# Patient Record
Sex: Female | Born: 1970 | Race: Black or African American | Hispanic: No | Smoking: Never smoker
Health system: Southern US, Community
[De-identification: ages and names within clinical notes are randomized; demographics above are authoritative.]

## PROBLEM LIST (undated history)

## (undated) DIAGNOSIS — I10 Essential (primary) hypertension: Secondary | ICD-10-CM

## (undated) DIAGNOSIS — E213 Hyperparathyroidism, unspecified: Secondary | ICD-10-CM

## (undated) DIAGNOSIS — Z8679 Personal history of other diseases of the circulatory system: Secondary | ICD-10-CM

## (undated) DIAGNOSIS — M722 Plantar fascial fibromatosis: Secondary | ICD-10-CM

## (undated) DIAGNOSIS — D649 Anemia, unspecified: Secondary | ICD-10-CM

## (undated) DIAGNOSIS — J189 Pneumonia, unspecified organism: Secondary | ICD-10-CM

## (undated) DIAGNOSIS — M1990 Unspecified osteoarthritis, unspecified site: Secondary | ICD-10-CM

## (undated) DIAGNOSIS — R87619 Unspecified abnormal cytological findings in specimens from cervix uteri: Secondary | ICD-10-CM

## (undated) DIAGNOSIS — I499 Cardiac arrhythmia, unspecified: Secondary | ICD-10-CM

## (undated) DIAGNOSIS — T7840XA Allergy, unspecified, initial encounter: Secondary | ICD-10-CM

## (undated) DIAGNOSIS — N200 Calculus of kidney: Secondary | ICD-10-CM

## (undated) DIAGNOSIS — Z87442 Personal history of urinary calculi: Secondary | ICD-10-CM

## (undated) DIAGNOSIS — I4891 Unspecified atrial fibrillation: Secondary | ICD-10-CM

## (undated) DIAGNOSIS — I509 Heart failure, unspecified: Secondary | ICD-10-CM

## (undated) DIAGNOSIS — D219 Benign neoplasm of connective and other soft tissue, unspecified: Secondary | ICD-10-CM

## (undated) DIAGNOSIS — E669 Obesity, unspecified: Secondary | ICD-10-CM

## (undated) DIAGNOSIS — D72829 Elevated white blood cell count, unspecified: Secondary | ICD-10-CM

## (undated) DIAGNOSIS — Z8489 Family history of other specified conditions: Secondary | ICD-10-CM

## (undated) HISTORY — DX: Allergy, unspecified, initial encounter: T78.40XA

## (undated) HISTORY — DX: Unspecified abnormal cytological findings in specimens from cervix uteri: R87.619

## (undated) HISTORY — PX: COLPOSCOPY: SHX161

## (undated) HISTORY — DX: Benign neoplasm of connective and other soft tissue, unspecified: D21.9

## (undated) HISTORY — DX: Calculus of kidney: N20.0

## (undated) HISTORY — DX: Obesity, unspecified: E66.9

## (undated) HISTORY — DX: Plantar fascial fibromatosis: M72.2

## (undated) HISTORY — DX: Unspecified atrial fibrillation: I48.91

## (undated) HISTORY — DX: Anemia, unspecified: D64.9

## (undated) HISTORY — DX: Elevated white blood cell count, unspecified: D72.829

---

## 1999-07-13 HISTORY — PX: TUBAL LIGATION: SHX77

## 2000-01-11 ENCOUNTER — Ambulatory Visit (HOSPITAL_COMMUNITY): Admission: RE | Admit: 2000-01-11 | Discharge: 2000-01-11 | Payer: Self-pay | Admitting: *Deleted

## 2000-01-27 ENCOUNTER — Inpatient Hospital Stay (HOSPITAL_COMMUNITY): Admission: AD | Admit: 2000-01-27 | Discharge: 2000-01-27 | Payer: Self-pay | Admitting: *Deleted

## 2000-02-22 ENCOUNTER — Ambulatory Visit (HOSPITAL_COMMUNITY): Admission: RE | Admit: 2000-02-22 | Discharge: 2000-02-22 | Payer: Self-pay | Admitting: *Deleted

## 2000-03-23 ENCOUNTER — Inpatient Hospital Stay (HOSPITAL_COMMUNITY): Admission: AD | Admit: 2000-03-23 | Discharge: 2000-03-23 | Payer: Self-pay | Admitting: Obstetrics

## 2000-03-23 ENCOUNTER — Encounter: Payer: Self-pay | Admitting: Obstetrics

## 2000-05-24 ENCOUNTER — Encounter (HOSPITAL_COMMUNITY): Admission: RE | Admit: 2000-05-24 | Discharge: 2000-05-30 | Payer: Self-pay | Admitting: *Deleted

## 2000-05-24 ENCOUNTER — Encounter: Payer: Self-pay | Admitting: *Deleted

## 2000-05-27 ENCOUNTER — Encounter (INDEPENDENT_AMBULATORY_CARE_PROVIDER_SITE_OTHER): Payer: Self-pay | Admitting: Specialist

## 2000-05-27 ENCOUNTER — Inpatient Hospital Stay (HOSPITAL_COMMUNITY): Admission: AD | Admit: 2000-05-27 | Discharge: 2000-05-30 | Payer: Self-pay | Admitting: Obstetrics & Gynecology

## 2000-05-27 ENCOUNTER — Encounter: Payer: Self-pay | Admitting: *Deleted

## 2003-06-04 ENCOUNTER — Emergency Department (HOSPITAL_COMMUNITY): Admission: EM | Admit: 2003-06-04 | Discharge: 2003-06-04 | Payer: Self-pay | Admitting: Emergency Medicine

## 2003-06-14 ENCOUNTER — Emergency Department (HOSPITAL_COMMUNITY): Admission: EM | Admit: 2003-06-14 | Discharge: 2003-06-14 | Payer: Self-pay | Admitting: Emergency Medicine

## 2004-03-06 ENCOUNTER — Emergency Department (HOSPITAL_COMMUNITY): Admission: EM | Admit: 2004-03-06 | Discharge: 2004-03-06 | Payer: Self-pay | Admitting: Emergency Medicine

## 2004-09-02 ENCOUNTER — Emergency Department (HOSPITAL_COMMUNITY): Admission: EM | Admit: 2004-09-02 | Discharge: 2004-09-02 | Payer: Self-pay | Admitting: Family Medicine

## 2005-01-05 ENCOUNTER — Emergency Department (HOSPITAL_COMMUNITY): Admission: EM | Admit: 2005-01-05 | Discharge: 2005-01-05 | Payer: Self-pay | Admitting: Family Medicine

## 2005-04-13 ENCOUNTER — Ambulatory Visit: Payer: Self-pay | Admitting: Family Medicine

## 2005-05-04 ENCOUNTER — Ambulatory Visit: Payer: Self-pay | Admitting: Internal Medicine

## 2005-05-19 ENCOUNTER — Ambulatory Visit: Payer: Self-pay | Admitting: Family Medicine

## 2005-05-23 ENCOUNTER — Ambulatory Visit: Payer: Self-pay | Admitting: *Deleted

## 2005-08-02 ENCOUNTER — Ambulatory Visit: Payer: Self-pay | Admitting: Family Medicine

## 2005-09-20 ENCOUNTER — Ambulatory Visit: Payer: Self-pay | Admitting: Family Medicine

## 2005-10-28 ENCOUNTER — Encounter: Payer: Self-pay | Admitting: Family Medicine

## 2005-10-28 ENCOUNTER — Ambulatory Visit: Payer: Self-pay | Admitting: Family Medicine

## 2005-11-19 ENCOUNTER — Ambulatory Visit: Payer: Self-pay | Admitting: Family Medicine

## 2006-04-26 ENCOUNTER — Emergency Department (HOSPITAL_COMMUNITY): Admission: EM | Admit: 2006-04-26 | Discharge: 2006-04-26 | Payer: Self-pay | Admitting: Family Medicine

## 2006-07-22 ENCOUNTER — Ambulatory Visit (HOSPITAL_COMMUNITY): Admission: RE | Admit: 2006-07-22 | Discharge: 2006-07-22 | Payer: Self-pay | Admitting: *Deleted

## 2006-11-24 ENCOUNTER — Encounter (INDEPENDENT_AMBULATORY_CARE_PROVIDER_SITE_OTHER): Payer: Self-pay | Admitting: Specialist

## 2006-11-24 ENCOUNTER — Ambulatory Visit (HOSPITAL_BASED_OUTPATIENT_CLINIC_OR_DEPARTMENT_OTHER): Admission: RE | Admit: 2006-11-24 | Discharge: 2006-11-24 | Payer: Self-pay | Admitting: Gynecology

## 2007-02-07 ENCOUNTER — Ambulatory Visit: Payer: Self-pay | Admitting: Internal Medicine

## 2007-03-01 ENCOUNTER — Encounter: Payer: Self-pay | Admitting: Internal Medicine

## 2007-03-01 ENCOUNTER — Ambulatory Visit (HOSPITAL_COMMUNITY): Admission: RE | Admit: 2007-03-01 | Discharge: 2007-03-01 | Payer: Self-pay | Admitting: Internal Medicine

## 2007-03-15 ENCOUNTER — Ambulatory Visit: Payer: Self-pay | Admitting: Internal Medicine

## 2007-10-24 DIAGNOSIS — K649 Unspecified hemorrhoids: Secondary | ICD-10-CM | POA: Insufficient documentation

## 2007-10-24 DIAGNOSIS — K602 Anal fissure, unspecified: Secondary | ICD-10-CM | POA: Insufficient documentation

## 2007-10-24 DIAGNOSIS — K644 Residual hemorrhoidal skin tags: Secondary | ICD-10-CM

## 2007-10-24 DIAGNOSIS — I10 Essential (primary) hypertension: Secondary | ICD-10-CM | POA: Insufficient documentation

## 2008-01-17 ENCOUNTER — Emergency Department (HOSPITAL_COMMUNITY): Admission: EM | Admit: 2008-01-17 | Discharge: 2008-01-17 | Payer: Self-pay | Admitting: Emergency Medicine

## 2009-12-12 ENCOUNTER — Ambulatory Visit: Payer: Self-pay | Admitting: Gynecology

## 2010-07-15 ENCOUNTER — Emergency Department (HOSPITAL_COMMUNITY)
Admission: EM | Admit: 2010-07-15 | Discharge: 2010-07-15 | Payer: Self-pay | Source: Home / Self Care | Admitting: Family Medicine

## 2010-11-24 NOTE — Assessment & Plan Note (Signed)
[facility]                         GASTROENTEROLOGY OFFICE NOTE   Christine Oneal, Christine Oneal                     MRN:          [id number]  DATE:02/07/2007                            DOB:          Nov 18, 1970    REFERRING PHYSICIAN:  Harrel Lemon. Merla Riches, M.D.   CHIEF COMPLAINT:  Rectal bleeding.   ASSESSMENT:  A 40 year old African-American woman who has had some  rectal bleeding problems.  This seems to have resolved as has her  constipation.  She has not responded completely to hemorrhoidal  treatment.  There is a sense that something is prolapsing which could be  a hemorrhoid.  I did not identify on exam today.   PLAN:  Schedule colonoscopy to investigate the rectal bleeding. If she  does have prolapsing hemorrhoids, we will plan for a possible attempted  hemorrhoidal ligation.  Risks, benefits, and indications of colonoscopy  and hemorrhoid ligation were explained to the patient.   HISTORY:  A 40 year old African-American woman who has been cared for at  [facility] off and on.  She has had rectal bleeding,  thought she had a fissure in 2008.  She tried hemorrhoidal creams,  lidocaine cream or ointment, as well as suppositories with  hydrocortisone.  She kept having recurrent problems.  She fell something  drop down, and she has pushed it back, and it is thought she has had a  hemorrhoid.  She has had a tag and a fissure in the past.  She had  hemoccult-positive stool on one of the exams at [facility] as well.  She was seen there in February, then in July  because of the persistent bleeding.  She has some pain when she sits.  Again, today she feels somewhat better.   MEDICATIONS:  1. Lisinopril 20/12.5 mg daily.  2. Meloxicam 15 mg daily.  3. Darvocet p.r.n.   DRUG ALLERGIES:  None known.   PAST MEDICAL HISTORY:  1. Hypertension.  2. Tubal ligation.   FAMILY HISTORY:  Uterine cancer in her mother and  grandmother.  No colon  cancer.   SOCIAL HISTORY:  She is married, one son, three daughters.  Lives with  her husband.  No alcohol, tobacco, or drugs.   REVIEW OF SYSTEMS:  Some pedal edema, some joint pains. She did have  some transient constipation problems, otherwise negative.   PHYSICAL EXAMINATION:  VITAL SIGNS: Height 5 feet 3 inches, weight 268  pounds.  Blood pressure 116/66, pulse 60.  EYES:  Anicteric.  ENT:  Normal mouth and pharynx.  NECK:  Supple, no mass.  CHEST:  Clear.  HEART:  S1, S2.  No gallops.  ABDOMEN:  Obese, soft, nontender.  No organomegaly or mass.  RECTAL:  She has a small rectocele.  I do not see any hemorrhoids or  palpate any.  Brown stool is present.  Rectal exam was performed in the  presence of female office staff.  EXTREMITIES:  Free of edema.  SKIN: No rash.  PSYCH:  Alert and oriented x3.   I have reviewed the office notes sent by [facility].   I appreciate the opportunity to care of this patient.     Iva Boop, MD,FACG  Electronically Signed    CEG/MedQ  DD: 02/07/2007  DT: 02/08/2007  Job #: [id number]   cc:   Harrel Lemon. Merla Riches, M.D.

## 2010-11-27 NOTE — Op Note (Signed)
[facility]  Patient:    Christine Oneal, Christine Oneal                      MRN: [id number] Proc. Date: 05/29/00 Adm. Date:  [id number] Attending:  Michaelle Copas                           Operative Report  PREOPERATIVE DIAGNOSIS:       Multiparity, desires permanent sterilization.  POSTOPERATIVE DIAGNOSIS:      Multiparity, desires permanent sterilization.  PROCEDURE:                    Postpartum tubal ligation, modified Pomeroy method.  SURGEON:                      Roseanna Rainbow, M.D.  ANESTHESIA:                   Epidural, local.  COMPLICATIONS:                None.  ESTIMATED BLOOD LOSS:         Less than 30 cc.  FLUIDS:                       Lactated Ringers 500 cc.  INDICATIONS:                  The patient is a 40 year old para 4 status post NSVD who desires permanent sterilization.  The risks, benefits of the procedure were discussed with the patient including the risk of failure of 4-8 per 1000 with an increased risk of an ectopic gestation if pregnancy occurs.  FINDINGS:                     Normal uterus, tubes, and ovaries.  PROCEDURE:                    The patient was taken to the operating room where epidural was found to be for the most part adequate.  The anesthetic effect was buttressed by administering local anesthesia.  Small transverse infraumbilical skin incision was made with the scalpel.  The incision was carried through the underlying fascia until the parietal peritoneum was identified and entered.  The peritoneum was noted to be free of any adhesions and the incision was extended with Metzenbaum scissors.  The patients left fallopian tube was then identified, brought to the incision, and grasped with the Babcock clamp.  The tube was then followed out to the fimbria.  The Babcock clamp was then used to grasp the tubes approximately 4 cm from the corneal region.  A 3 cm segment of tube was then doubly ligated  with free ties of plain gut and excised.  Excellent hemostasis was noted and the tube returned to the abdomen.  The right fallopian tube was manipulated in a similar fashion.  The peritoneum and fascia were closed in a single layer using 0 Vicryl.  The skin was closed in a subcuticular fashion using 3-0 Vicryl.  The patient tolerated the procedure well.  Sponge, lap, and needle counts were correct x 2.  The patient was taken to the PACU in stable condition.  PATHOLOGY:                    Segments of right and left fallopian tubes. DD:  05/29/00 TD:  05/29/00 Job: [id number]

## 2010-11-27 NOTE — H&P (Signed)
Christine Oneal, Christine Oneal              ACCOUNT NO.:  [id number]   MEDICAL RECORD NO.:  [id number]          PATIENT TYPE:  AMB   LOCATION:  [facility]                         FACILITY:  [facility]   PHYSICIAN:  Timothy P. Fontaine, M.D.DATE OF BIRTH:  [birth year]   DATE OF ADMISSION:  DATE OF DISCHARGE:                              HISTORY & PHYSICAL   She is being admitted to [facility] [place] for surgery May15 at  8:30.   CHIEF COMPLAINT:  Irregular bleeding.  Menorrhagia.   HISTORY OF PRESENT ILLNESS:  A 40 year old G53, P4 female status post  postpartum tubal sterilization that [facility]  complaining of menorrhagia.  Menses lasting 10 days-12 days each month  progressively getting heavier.  Also notes some irregularity in her  cycle length.  Her hemoglobin was 11.7.  A normal TSH.  She underwent a  sonohystogram which showed multiple small myomas, largest measuring 34  mm, and an endometrial defect consistent with endometrial polyp.  She is  admitted at this time for hysteroscopy, removal of polyp, D&C.   PAST MEDICAL HISTORY:  Significant for hypertension.   PAST SURGICAL HISTORY:  Tubal sterilization postpartum.   CURRENT MEDICATIONS:  1. Atenolol / chlorthal  2. Propoxyphene p.r.n.  3. Meloxicam 15 mg.  4. Diclofenac 75 mg.  5. Centrum multivitamin.   ALLERGIES:  No medications.   FAMILY HISTORY:  Noncontributory.   SOCIAL HISTORY:  Noncontributory.   FAMILY HISTORY:  Noncontributory.   ADMISSION PHYSICAL EXAM:  VITAL SIGNS:  Afebrile.  Vital signs are  stable with blood pressure 122/82.  HEENT:  Normal.  LUNGS:  Clear.  CARDIAC:  Regular rate without rubs, murmurs, or gallops.  ABDOMINAL EXAM:  Benign.  PELVIC:  External BUS, vagina normal.  Cervix normal.  Uterus grossly  normal, midline, and mobile.  Limited by abdominal girth.  Adnexa  without gross masses or tenderness.   ASSESSMENT:  A 40 year old gravida 4, para 4 female status post  tubal  sterilization, increasing menorrhagia, multiple small myomas,  endometrial polyp, for hysteroscopy, polypectomy, dilatation and  curettage.  I reviewed the proposed surgery with the patient; expected  intraoperative postoperative courses, use of the resectoscope, and  dilatation and curettage.  She understands there are no guarantees as  far as menorrhagia relief particularly with her small myomas but given  the clear endometrial defect that we will start by removing this and see  if her cycles do not lighten.  The risks of bleeding and the risks of  transfusion including transfusion reaction, hepatitis, human  immunodeficiency virus, [place] disease, and other unknown entities was  discussed, understood, and accepted.  The risks of infection immediately  recognized or delay-recognized, prolonged antibiotics, as well as the  risk of uterine perforation, damage to internal organs either directly  through uterine perforation or transuterine thermal damage with the use  of the resectoscope leading to bowel, bladder, ureters, vessels, and  nerve injury necessitating major exploratory reparative surgeries,  future reparative surgeries, bowel resection, ostomy formation, was all  discussed, understood, and accepted.  The patient's questions were  answered to her  satisfaction.  She is ready to proceed with surgery.      Timothy P. Fontaine, M.D.  Electronically Signed     TPF/MEDQ  D:  11/18/2006  T:  11/18/2006  Job:  [id number]

## 2010-11-27 NOTE — Op Note (Signed)
Christine Oneal, Christine Oneal              ACCOUNT NO.:  [id number]   MEDICAL RECORD NO.:  [id number]          PATIENT TYPE:  AMB   LOCATION:  [facility]                         FACILITY:  [facility]   PHYSICIAN:  Timothy P. Fontaine, M.D.DATE OF BIRTH:  05-10-1971   DATE OF PROCEDURE:  11/24/2006  DATE OF DISCHARGE:                               OPERATIVE REPORT   PREOPERATIVE DIAGNOSES:  Menorrhagia and endometrial polyp.   POSTOPERATIVE DIAGNOSES:  Menorrhagia and endometrial polyp.   PROCEDURE:  Hysteroscopy D&C.   SURGEON:  Fontaine.   ANESTHETIC:  1. MAC.  2. 1% lidocaine paracervical block.   SPECIMEN:  Endometrial curetting.   ESTIMATED BLOOD LOSS:  Minimal.   SORBITOL DISCREPANCY:  85 mL.   COMPLICATIONS:  None.   FINDINGS:  EUA:  External BUS, vagina normal.  Cervix normal.  Normal  uterus grossly normal in size, midline and mobile.  Adnexa without  masses.  Hysteroscopic:  Initially limited by abundant endometrial  undulations and polypoid growths.  Post D&C, cavity was grossly normal,  symmetrical right and left cornual regions visualized.  Fundus, anterior-  posterior uterine surfaces, lower uterine segment, endocervical canal  all visualized and grossly normal.   PROCEDURE:  The patient was taken to the operating room, underwent IV  sedation, was placed in a low dorsal lithotomy position, received a  perineal vaginal preparation with Betadine solution.  Bladder emptied  with in-and-out Foley catheterization in sterile technique.  EUA  performed.  The patient was draped in usual fashion.  The cervix was  visualized with a speculum.  Anterior lip grasped with single-tooth  tenaculum, and a 1% lidocaine paracervical block was placed, a total of  10 mL.  The cervix was then gently dilated to admit the operative  hysteroscope, and initial hysteroscopy was performed with multiple  polypoid endometrial areas which obscured good visualization.  A sharp  curettage was then performed  with abundant return requiring three  changes of the Telfa pads, and subsequently hysteroscopy was reperformed  showing an empty cavity.  No abnormalities, good distension, with no  residual polyps or other abnormalities.  The instruments were removed.  Tenaculum removed. Slight oozing noted.  Silver nitrate applied for  ultimate hemostasis.  The patient was placed in supine position and was  taken to recovery room in good condition, having tolerated the procedure  well.      Timothy P. Fontaine, M.D.  Electronically Signed     TPF/MEDQ  D:  11/24/2006  T:  11/24/2006  Job:  [id number]

## 2011-01-23 ENCOUNTER — Inpatient Hospital Stay (INDEPENDENT_AMBULATORY_CARE_PROVIDER_SITE_OTHER)
Admission: RE | Admit: 2011-01-23 | Discharge: 2011-01-23 | Disposition: A | Discharge status: Home / Self Care | Payer: Self-pay | Source: Ambulatory Visit | Attending: Family Medicine | Admitting: Family Medicine

## 2011-01-23 DIAGNOSIS — I8390 Asymptomatic varicose veins of unspecified lower extremity: Secondary | ICD-10-CM

## 2011-01-23 DIAGNOSIS — M79609 Pain in unspecified limb: Secondary | ICD-10-CM

## 2011-04-08 LAB — URINE MICROSCOPIC-ADD ON

## 2011-04-08 LAB — URINALYSIS, ROUTINE W REFLEX MICROSCOPIC
Bilirubin Urine: NEGATIVE
Glucose, UA: NEGATIVE
Hgb urine dipstick: NEGATIVE
Ketones, ur: NEGATIVE
Specific Gravity, Urine: 1.02
pH: 6.5

## 2011-04-08 LAB — POCT PREGNANCY, URINE
Operator id: 277751
Preg Test, Ur: NEGATIVE

## 2011-08-17 ENCOUNTER — Emergency Department (INDEPENDENT_AMBULATORY_CARE_PROVIDER_SITE_OTHER)
Admission: EM | Admit: 2011-08-17 | Discharge: 2011-08-17 | Disposition: A | Discharge status: Home / Self Care | Payer: Self-pay | Source: Home / Self Care | Attending: Family Medicine | Admitting: Family Medicine

## 2011-08-17 ENCOUNTER — Encounter (HOSPITAL_COMMUNITY): Payer: Self-pay | Admitting: *Deleted

## 2011-08-17 DIAGNOSIS — R51 Headache: Secondary | ICD-10-CM

## 2011-08-17 HISTORY — DX: Essential (primary) hypertension: I10

## 2011-08-17 MED ORDER — IBUPROFEN 800 MG PO TABS: 800.0000 mg | ORAL_TABLET | Freq: Three times a day (TID) | ORAL | Status: AC | PRN
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
## 2011-08-19 ENCOUNTER — Encounter (HOSPITAL_COMMUNITY): Payer: Self-pay | Admitting: Emergency Medicine

## 2011-08-19 ENCOUNTER — Emergency Department (INDEPENDENT_AMBULATORY_CARE_PROVIDER_SITE_OTHER)
Admission: EM | Admit: 2011-08-19 | Discharge: 2011-08-19 | Disposition: A | Discharge status: Home / Self Care | Payer: Self-pay | Source: Home / Self Care | Attending: Family Medicine | Admitting: Family Medicine

## 2011-08-19 DIAGNOSIS — IMO0002 Reserved for concepts with insufficient information to code with codable children: Secondary | ICD-10-CM

## 2011-08-19 DIAGNOSIS — Z041 Encounter for examination and observation following transport accident: Secondary | ICD-10-CM

## 2011-08-19 DIAGNOSIS — S239XXA Sprain of unspecified parts of thorax, initial encounter: Secondary | ICD-10-CM

## 2011-08-19 MED ORDER — CYCLOBENZAPRINE HCL 5 MG PO TABS: 5.0000 mg | ORAL_TABLET | Freq: Three times a day (TID) | ORAL | Status: AC | PRN

## 2011-08-19 MED ORDER — DICLOFENAC POTASSIUM 50 MG PO TABS: 50.0000 mg | ORAL_TABLET | Freq: Three times a day (TID) | ORAL | Status: AC
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
## 2011-08-25 ENCOUNTER — Emergency Department (HOSPITAL_COMMUNITY)
Admission: EM | Admit: 2011-08-25 | Discharge: 2011-08-25 | Disposition: A | Discharge status: Home / Self Care | Payer: Self-pay | Source: Home / Self Care | Attending: Family Medicine | Admitting: Family Medicine

## 2011-08-25 ENCOUNTER — Encounter (HOSPITAL_COMMUNITY): Payer: Self-pay | Admitting: *Deleted

## 2011-08-25 DIAGNOSIS — J310 Chronic rhinitis: Secondary | ICD-10-CM

## 2011-08-25 DIAGNOSIS — R05 Cough: Secondary | ICD-10-CM

## 2011-08-25 MED ORDER — GUAIFENESIN-CODEINE 100-10 MG/5ML PO SYRP: 5.0000 mL | ORAL_SOLUTION | Freq: Four times a day (QID) | ORAL | Status: AC | PRN

## 2011-08-25 MED ORDER — FLUTICASONE PROPIONATE 50 MCG/ACT NA SUSP: 2.0000 | Freq: Every day | NASAL | Status: DC

## 2011-08-25 NOTE — ED Notes (Signed)
Pt  Reports        Cough  /  Congested         Sinus  Drainage   With  sorethroat  Body  Aches      X  3  Days  Pt       Describes  The  Cough  As  Thick  And  Ropey  With mucous   -  Pt  Is  Sitting upright on  Exam table  Masked  And  In a  Private  Room      She  Is  In no  Distress  And  Is  Speaking in  Complete  sentances

## 2011-08-25 NOTE — Discharge Instructions (Signed)
Use nasal steroid spray as directed.  Use cough syrup as needed and as directed. I recommend any fever control with acetaminophen (Tylenol) and/or ibuprofen. You may use these together, alternating them every 4 hours, or individually, every 8 hours. For example, take acetaminophen 500 to 1000 mg at 12 noon, then 600 to 800 mg of ibuprofen at 4 pm, then acetaminophen at 8 pm, etc. Also, stay hydrated with clear liquids. Return to care should your symptoms not improve, or worsen in any way.

## 2011-08-25 NOTE — ED Provider Notes (Signed)
History     CSN: [id number]  Arrival date & time 08/25/11  1901   First MD Initiated Contact with Patient 08/25/11 1953      Chief Complaint  Patient presents with  . Cough    (Consider location/radiation/quality/duration/timing/severity/associated sxs/prior treatment) HPI Comments: Christine Oneal presents for evaluation for persistent cough since Sunday. She also now reports nasal congestion and drainage with sore throat, and abdominal pain with coughing. She denies any fever or chills. She denies any respiratory disease. No history of asthma. Her husband does smoke cigarettes indoors. She's been trying over-the-counter preparations without much relief.  Patient is a 41 y.o. female presenting with cough. The history is provided by the patient.  Cough This is a new problem. The current episode started more than 2 days ago. The problem occurs constantly. The problem has not changed since onset.The cough is productive of sputum. There has been no fever. Associated symptoms include rhinorrhea and sore throat. Pertinent negatives include no shortness of breath and no wheezing. She has tried decongestants for the symptoms. She is not a smoker.    Past Medical History  Diagnosis Date  . Hypertension     Past Surgical History  Procedure Date  . Tubal ligation     Family History  Problem Relation Age of Onset  . Hypertension Mother   . Hypertension Father   . Diabetes Sister   . Hypertension Brother     History  Substance Use Topics  . Smoking status: Never Smoker   . Smokeless tobacco: Not on file  . Alcohol Use: No    OB History    Grav Para Term Preterm Abortions TAB SAB Ect Mult Living                  Review of Systems  Constitutional: Negative.   HENT: Positive for congestion, sore throat, rhinorrhea and postnasal drip.   Eyes: Negative.   Respiratory: Positive for cough. Negative for shortness of breath and wheezing.   Cardiovascular: Negative.   Gastrointestinal:  Positive for abdominal pain.  Genitourinary: Negative.   Musculoskeletal: Negative.   Skin: Negative.   Neurological: Negative.     Allergies  Review of patient's allergies indicates no known allergies.  Home Medications   Current Outpatient Rx  Name Route Sig Dispense Refill  . ACETAMINOPHEN 325 MG PO TABS Oral Take 650 mg by mouth every 6 (six) hours as needed.    . CYCLOBENZAPRINE HCL 5 MG PO TABS Oral Take 1 tablet (5 mg total) by mouth 3 (three) times daily as needed for muscle spasms. 30 tablet 0  . DICLOFENAC POTASSIUM 50 MG PO TABS Oral Take 1 tablet (50 mg total) by mouth 3 (three) times daily. For back pain 21 tablet 0  . FLUTICASONE PROPIONATE 50 MCG/ACT NA SUSP Nasal Place 2 sprays into the nose daily. 16 g 2  . GUAIFENESIN-CODEINE 100-10 MG/5ML PO SYRP Oral Take 5 mLs by mouth every 6 (six) hours as needed for cough or congestion. 120 mL 0  . IBUPROFEN 800 MG PO TABS Oral Take 1 tablet (800 mg total) by mouth every 8 (eight) hours as needed for pain. 30 tablet 0  . LISINOPRIL-HYDROCHLOROTHIAZIDE 10-12.5 MG PO TABS Oral Take 1 tablet by mouth daily.      BP 146/98  Pulse 102  Temp(Src) 98.6 F (37 C) (Oral)  Resp 18  SpO2 100%  LMP 08/15/2011  Physical Exam  Nursing note and vitals reviewed. Constitutional: She is oriented to person, place,  and time. She appears well-developed and well-nourished.  HENT:  Head: Normocephalic and atraumatic.  Right Ear: Tympanic membrane is retracted.  Left Ear: Tympanic membrane is retracted.  Mouth/Throat: Uvula is midline, oropharynx is clear and moist and mucous membranes are normal. No posterior oropharyngeal edema or posterior oropharyngeal erythema.  Eyes: EOM are normal.  Neck: Normal range of motion.  Pulmonary/Chest: Effort normal and breath sounds normal. She has no wheezes. She has no rhonchi.  Musculoskeletal: Normal range of motion.  Neurological: She is alert and oriented to person, place, and time.  Skin: Skin is  warm and dry.  Psychiatric: Her behavior is normal.    ED Course  Procedures (including critical care time)  Labs Reviewed - No data to display No results found.   1. Cough   2. Rhinitis       MDM  rx given for fluticasone and guaifenesin AC        Richardo Priest, MD 08/25/11 2038

## 2011-09-09 ENCOUNTER — Emergency Department (HOSPITAL_COMMUNITY)
Admission: EM | Admit: 2011-09-09 | Discharge: 2011-09-09 | Disposition: A | Discharge status: Home Health | Payer: Self-pay | Attending: Emergency Medicine | Admitting: Emergency Medicine

## 2011-09-09 ENCOUNTER — Encounter (HOSPITAL_COMMUNITY): Payer: Self-pay | Admitting: *Deleted

## 2011-09-09 DIAGNOSIS — R220 Localized swelling, mass and lump, head: Secondary | ICD-10-CM | POA: Insufficient documentation

## 2011-09-09 DIAGNOSIS — X58XXXA Exposure to other specified factors, initial encounter: Secondary | ICD-10-CM | POA: Insufficient documentation

## 2011-09-09 DIAGNOSIS — I10 Essential (primary) hypertension: Secondary | ICD-10-CM | POA: Insufficient documentation

## 2011-09-09 DIAGNOSIS — R209 Unspecified disturbances of skin sensation: Secondary | ICD-10-CM | POA: Insufficient documentation

## 2011-09-09 DIAGNOSIS — R221 Localized swelling, mass and lump, neck: Secondary | ICD-10-CM | POA: Insufficient documentation

## 2011-09-09 DIAGNOSIS — T783XXA Angioneurotic edema, initial encounter: Secondary | ICD-10-CM | POA: Insufficient documentation

## 2011-09-09 DIAGNOSIS — R0602 Shortness of breath: Secondary | ICD-10-CM | POA: Insufficient documentation

## 2011-09-09 MED ORDER — SODIUM CHLORIDE 0.9 % IV SOLN
Freq: Once | INTRAVENOUS | Status: AC
  Administered 2011-09-09: 13:00:00 via INTRAVENOUS

## 2011-09-09 MED ORDER — DIPHENHYDRAMINE HCL 25 MG PO TABS: 25.0000 mg | ORAL_TABLET | Freq: Four times a day (QID) | ORAL | Status: DC

## 2011-09-09 MED ORDER — FAMOTIDINE 20 MG PO TABS: 20.0000 mg | ORAL_TABLET | Freq: Two times a day (BID) | ORAL | Status: DC

## 2011-09-09 MED ORDER — FAMOTIDINE IN NACL 20-0.9 MG/50ML-% IV SOLN
20.0000 mg | Freq: Once | INTRAVENOUS | Status: AC
  Administered 2011-09-09: 20 mg via INTRAVENOUS
  Filled 2011-09-09: qty 50

## 2011-09-09 MED ORDER — DIPHENHYDRAMINE HCL 50 MG/ML IJ SOLN
50.0000 mg | Freq: Once | INTRAMUSCULAR | Status: AC
  Administered 2011-09-09: 50 mg via INTRAVENOUS
  Filled 2011-09-09: qty 1

## 2011-09-09 MED ORDER — METHYLPREDNISOLONE SODIUM SUCC 125 MG IJ SOLR
125.0000 mg | INTRAMUSCULAR | Status: AC
  Administered 2011-09-09: 125 mg via INTRAVENOUS
  Filled 2011-09-09: qty 2

## 2011-09-09 MED ORDER — METHYLPREDNISOLONE 4 MG PO KIT: PACK | ORAL | Status: DC

## 2011-09-09 NOTE — ED Notes (Signed)
Pt states she was the doctor today when she started to become sob and itch all over. Pt states she does not know what made her have an allergic reaction. Pt is stable at this time and airway is clear

## 2011-09-09 NOTE — Discharge Instructions (Signed)
Your allergic reaction today is likely due to your lisinopril medication; this is a common reaction to this medication. Please STOP taking it. You will need to follow up with your primary care doctor to be started on a new medication for your blood pressure. You've been given a prescription for a steroid dose pack. Take this as prescribed. You've also been given prescriptions for Benadryl and Pepcid. Please take the Benadryl and Pepcid as prescribed for the next 24-48 hours. Return to the ER with trouble breathing, worsening condition, or any further concerns.  Angioedema Angioedema (AE) is a sudden swelling of the eyelids, lips, lobes of ears, external genitalia, skin, and other parts of the body. AE can happen by itself. It usually begins during the night and is found on awakening. It can happen with hives and other allergic reactions. Attacks can be mild and annoying, or life-threatening if the air passages swell. AE generally occurs in a short time period (over minutes to hours) and gets better in 24 to 48 hours. It usually does not cause any serious problems.  There are 2 different kinds of AE:   Allergic AE.   Nonallergic AE.   There may be an overreaction or direct stimulation of cells that are a part of the immune system (mast cells).   There may be problems with the release of chemicals made by the body that cause swelling and inflammation (kinins). AE due to kinins can be inherited from parents (hereditary), or it can develop on its own (acquired). Acquired AE either shows up before, or along with, certain diseases or is due to the body's immune system attacking parts of the body's own cells (autoimmune).  CAUSES  Allergic  AE due to allergic reactions are caused by something that causes the body to react (trigger). Common triggers include:   Foods.   Medicines.   Latex.   Direct contact with certain fruits, vegetables, or animal saliva.   Insect stings.  Nonallergic  Mast cell  stimulation may be caused by:   Medicines.   Dyes used in X-rays.   The body's own immune system reactions to parts of the body (autoimmune disease).   Possibly, some virus infections.   AE due to problems with kinins can be hereditary or acquired. Attacks are triggered by:   Mild injury.   Dental work or any surgery.   Stress.   Sudden changes in temperature.   Exercise.   Medicines.   AE due to problems with kinins can also be due to certain medicines, especially blood pressure medicines like angiotensin-converting enzyme (ACE) inhibitors. African Americans are at nearly 5 times greater risk of developing AE than Caucasians from ACE inhibitors.  SYMPTOMS  Allergic symptoms:  Non-itchy swelling of the skin. Often the swelling is on the face and lips, but any area of the skin can swell. Sometimes, the swelling can be painful. If hives are present, there is intense itching.   Breathing problems if the air passages swell.  Nonallergic symptoms:  If internal organs are involved, there may be:   Nausea.   Abdominal pain.   Vomiting.   Difficulty swallowing.   Difficulty passing urine.   Breathing problems if the air passages swell.  Depending on the cause of AE, episodes may:  Only happen once (if triggers are removed or avoided).   Come back in unpredictable patterns.   Repeat for several years and then gradually fade away.  DIAGNOSIS  AE is diagnosed by:   Asking questions  to find out how fast the symptoms began.   Taking a family history.   Physical exam.   Diagnostic tests. Tests could include:   Allergy skin tests to see if the problem is allergic.   Blood tests to diagnose hereditary and some acquired types of AE.   Other tests to see if there is a hidden disease leading to the AE.  TREATMENT  Treatment depends on the type and cause (if any) of the AE. Allergic  Allergic types of AE are treated with:   Immediate removal of the trigger or  medicine (if any).   Epinephrine injection.   Steroids.   Antihistamines.   Hospitalization for severe attacks.  Nonallergic  Mast cell stimulation types of AE are treated with:   Immediate removal of the trigger or medicine (if any).   Epinephrine injection.   Steroids.   Antihistamines.   Hospitalization for severe attacks.   Hereditary AE is treated with:   Medicines to prevent and treat attacks. There is little response to antihistamines, epinephrine, or steroids.   Preventive medicines before dental work or surgery.   Removing or avoiding medicines that trigger attacks.   Hospitalization for severe attacks.   Acquired AE is treated with:   Treating underlying disease (if any).   Medicines to prevent and treat attacks.  HOME CARE INSTRUCTIONS   Always carry your emergency allergy treatment medicines with you.   Wear a medical bracelet.   Avoid known triggers.  SEEK MEDICAL CARE IF:   You get repeat attacks.   Your attacks are more frequent or more severe despite preventive measures.   You have hereditary AE and are considering having children. It is important to discuss the risks of passing this on to your children.  SEEK IMMEDIATE MEDICAL CARE IF:   You have difficulty breathing.   You have difficulty swallowing.   You experience fainting.  This condition should be treated immediately. It can be life-threatening if it involves throat swelling. Document Released: 09/06/2001 Document Revised: 03/10/2011 Document Reviewed: 06/27/2008 [facility] Patient Information 2012 [place], [place].

## 2011-09-09 NOTE — ED Provider Notes (Signed)
History     CSN: [id number]  Arrival date & time 09/09/11  1204   First MD Initiated Contact with Patient 09/09/11 1224      No chief complaint on file.   (Consider location/radiation/quality/duration/timing/severity/associated sxs/prior treatment) The history is provided by the patient.   41 year old female with past medical history of hypertension, currently taking lisinopril, presents with shortness of breath and itching. She states that she was at her chiropractor's office approximately one hour ago for her routine treatment, when she suddenly became short of breath and felt as if her tongue was swelling. She also noticed a tingling sensation in her palms bilaterally. She has not noticed any swelling to her lips or face. She's not noticed any rash. Has never had anything like this in the past. No known allergies to foods or drugs; she does not recall any new exposures recently. She states that she's been taking the lisinopril for some time, several years, with no complications. She denies family history of hereditary angioedema. Denies chest pain, diaphoresis, nausea, vomiting, abdominal pain.  Past Medical History  Diagnosis Date  . Hypertension     Past Surgical History  Procedure Date  . Tubal ligation     Family History  Problem Relation Age of Onset  . Hypertension Mother   . Hypertension Father   . Diabetes Sister   . Hypertension Brother     History  Substance Use Topics  . Smoking status: Never Smoker   . Smokeless tobacco: Not on file  . Alcohol Use: No    OB History    Grav Para Term Preterm Abortions TAB SAB Ect Mult Living                  Review of Systems  Constitutional: Negative.   HENT: Positive for voice change. Negative for congestion, facial swelling, rhinorrhea, drooling, trouble swallowing and neck pain.   Eyes: Negative.   Respiratory: Positive for shortness of breath. Negative for chest tightness.   Cardiovascular: Negative for chest  pain, palpitations and leg swelling.  Gastrointestinal: Negative for nausea, vomiting and abdominal pain.  Musculoskeletal: Negative for myalgias.  Skin: Negative for color change and rash.  Neurological: Negative for dizziness, weakness and headaches.    Allergies  Review of patient's allergies indicates no known allergies.  Home Medications   Current Outpatient Rx  Name Route Sig Dispense Refill  . ACETAMINOPHEN 325 MG PO TABS Oral Take 650 mg by mouth every 6 (six) hours as needed.    . CYCLOBENZAPRINE HCL 10 MG PO TABS Oral Take 10 mg by mouth 3 (three) times daily as needed. spasms    . DICLOFENAC POTASSIUM 50 MG PO TABS Oral Take 1 tablet (50 mg total) by mouth 3 (three) times daily. For back pain 21 tablet 0  . FLUTICASONE PROPIONATE 50 MCG/ACT NA SUSP Nasal Place 2 sprays into the nose daily. 16 g 2  . IBUPROFEN 800 MG PO TABS Oral Take 800 mg by mouth every 8 (eight) hours as needed. pain    . LISINOPRIL-HYDROCHLOROTHIAZIDE 10-12.5 MG PO TABS Oral Take 1 tablet by mouth daily.      BP 120/69  Pulse 107  Temp 99.5 F (37.5 C)  Resp 22  SpO2 100%  LMP 08/15/2011  Physical Exam  Nursing note and vitals reviewed. Constitutional: She appears well-developed and well-nourished. No distress.  HENT:  Head: Normocephalic and atraumatic.       Patient with hoarse speech. She has mild tongue swelling without  airway compromise. No evidence of uvular edema; post oropharynx clear. No facial swelling noted.  Eyes: EOM are normal. Pupils are equal, round, and reactive to light.  Neck: Normal range of motion. Neck supple.  Cardiovascular: Normal rate, regular rhythm and normal heart sounds.   Pulmonary/Chest: Effort normal and breath sounds normal. No respiratory distress. She exhibits no tenderness.       No acute resp distress  Abdominal: Soft. Bowel sounds are normal. There is no tenderness. There is no rebound and no guarding.  Musculoskeletal: Normal range of motion.    Lymphadenopathy:    She has no cervical adenopathy.  Skin: Skin is warm and dry. No rash noted. She is not diaphoretic.  Psychiatric: She has a normal mood and affect.    ED Course  Procedures (including critical care time)  Labs Reviewed - No data to display No results found.   1. Angioedema       MDM  12:30 PM Pt assessed. Suspect angioedema; no immediate airway compromise. Will give solumedrol/benadryl/pepcid. D/W Dr. Jeraldine Loots. Will monitor closely.  4:00 PM Patient has been rechecked several times, and has had no worsening of her symptoms. Her speech seems to have improved. Her exam is unchanged from initial. As she has been observed for some time with no change in her status, feel that she is stable for discharge home at this point. Will start her on steroids as well as prescriptions for Benadryl and Pepcid. She was instructed to stop taking the lisinopril, as this is the most likely cause for her symptoms. She was agreeable to this plan. Return precautions were discussed.      Grant Fontana, [place] 09/09/11 2124

## 2011-09-09 NOTE — ED Notes (Signed)
Pt getting dressed and will be walked to discharge window when finished.

## 2011-09-10 NOTE — ED Provider Notes (Signed)
Medical screening examination/treatment/procedure(s) were conducted as a shared visit with non-physician practitioner(s) and myself.  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  Following a prolonged ED period (and the initial provision of meds) she was d/c in stable condition with instructions to d/c Lisinopril.  Gerhard Munch, MD 09/10/11 [phone]

## 2011-09-14 ENCOUNTER — Ambulatory Visit (INDEPENDENT_AMBULATORY_CARE_PROVIDER_SITE_OTHER): Payer: Self-pay

## 2011-09-14 ENCOUNTER — Ambulatory Visit: Payer: Self-pay | Admitting: Internal Medicine

## 2011-09-14 VITALS — BP 164/96 | HR 92 | Temp 99.4°F | Resp 16 | Ht 63.0 in | Wt 281.0 lb

## 2011-09-14 DIAGNOSIS — I10 Essential (primary) hypertension: Secondary | ICD-10-CM

## 2011-09-14 DIAGNOSIS — D649 Anemia, unspecified: Secondary | ICD-10-CM

## 2011-09-14 DIAGNOSIS — Z719 Counseling, unspecified: Secondary | ICD-10-CM

## 2011-09-14 DIAGNOSIS — D500 Iron deficiency anemia secondary to blood loss (chronic): Secondary | ICD-10-CM

## 2011-09-14 DIAGNOSIS — E669 Obesity, unspecified: Secondary | ICD-10-CM

## 2011-09-14 DIAGNOSIS — T887XXA Unspecified adverse effect of drug or medicament, initial encounter: Secondary | ICD-10-CM

## 2011-09-14 LAB — POCT CBC
Granulocyte percent: 72.4 %G (ref 37–80)
HCT, POC: 37.1 % — AB (ref 37.7–47.9)
Hemoglobin: 11.7 g/dL — AB (ref 12.2–16.2)
MCV: 93.7 fL (ref 80–97)
MID (cbc): 0.7 (ref 0–0.9)
Platelet Count, POC: 456 10*3/uL — AB (ref 142–424)
RBC: 3.96 M/uL — AB (ref 4.04–5.48)
WBC: 16.3 10*3/uL — AB (ref 4.6–10.2)

## 2011-09-14 LAB — POCT GLYCOSYLATED HEMOGLOBIN (HGB A1C): Hemoglobin A1C: 5.1

## 2011-09-14 MED ORDER — AMLODIPINE BESYLATE 10 MG PO TABS: 10.0000 mg | ORAL_TABLET | Freq: Every day | ORAL | Status: DC

## 2011-09-14 NOTE — Progress Notes (Signed)
  Subjective:    Patient ID: Christine Oneal, female    DOB: 04-01-1971, 41 y.o.   MRN: [id number]  HPI Has HTN and progressive morbid obesity. Had hive rx to lisinopril recently txed in er. Feels fine now.She is taking multiple NSAIDS and prednisone as we speak.   Review of Systems Stable    Objective:   Physical Exam  Constitutional: She is oriented to person, place, and time. She appears well-nourished. No distress.  Eyes: Conjunctivae, EOM and lids are normal. Pupils are equal, round, and reactive to light.       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  Cardiovascular: Normal rate, regular rhythm and normal heart sounds.   Pulmonary/Chest: Effort normal and breath sounds normal.  Neurological: She is alert and oriented to person, place, and time. No cranial nerve deficit. Coordination normal.  Skin: No rash noted.  Psychiatric: She has a normal mood and affect.    Results for orders placed in visit on 09/14/11  POCT CBC      Component Value Range   WBC 16.3 (*) 4.6 - 10.2 (K/uL)   Lymph, poc 3.8 (*) 0.6 - 3.4    POC LYMPH PERCENT 23.3  10 - 50 (%L)   MID (cbc) 0.7  0 - 0.9    POC MID % 4.3  0 - 12 (%M)   POC Granulocyte 11.8 (*) 2 - 6.9    Granulocyte percent 72.4  37 - 80 (%G)   RBC 3.96 (*) 4.04 - 5.48 (M/uL)   Hemoglobin 11.7 (*) 12.2 - 16.2 (g/dL)   HCT, POC 95.2 (*) 84.1 - 47.9 (%)   MCV 93.7  80 - 97 (fL)   MCH, POC 29.5  27 - 31.2 (pg)   MCHC 31.5 (*) 31.8 - 35.4 (g/dL)   RDW, POC 32.4     Platelet Count, POC 456 (*) 142 - 424 (K/uL)   MPV 7.5  0 - 99.8 (fL)  GLUCOSE, POCT (MANUAL RESULT ENTRY)      Component Value Range   POC Glucose 106    POCT GLYCOSYLATED HEMOGLOBIN (HGB A1C)      Component Value Range   Hemoglobin A1C 5.1     Cmet,TSH pending      Assessment & Plan:   HTN Obesity  DC NSAIDS Lose wt. counceled  Start Amlodipine 10mg  qd CPE and f/up 3-4 weeks.

## 2011-09-14 NOTE — Patient Instructions (Signed)

## 2011-09-15 LAB — COMPREHENSIVE METABOLIC PANEL
ALT: 19 U/L (ref 0–35)
Albumin: 3.9 g/dL (ref 3.5–5.2)
Alkaline Phosphatase: 94 U/L (ref 39–117)
CO2: 24 mEq/L (ref 19–32)
Potassium: 3.8 mEq/L (ref 3.5–5.3)
Sodium: 140 mEq/L (ref 135–145)
Total Bilirubin: 0.5 mg/dL (ref 0.3–1.2)
Total Protein: 7 g/dL (ref 6.0–8.3)

## 2011-09-15 LAB — TSH: TSH: 2.274 u[IU]/mL (ref 0.350–4.500)

## 2011-09-16 ENCOUNTER — Encounter: Payer: Self-pay | Admitting: Radiology

## 2011-11-14 ENCOUNTER — Encounter: Payer: Self-pay | Admitting: Family Medicine

## 2011-11-14 ENCOUNTER — Ambulatory Visit (INDEPENDENT_AMBULATORY_CARE_PROVIDER_SITE_OTHER): Payer: No Typology Code available for payment source | Admitting: Family Medicine

## 2011-11-14 VITALS — BP 124/87 | HR 91 | Temp 98.3°F | Resp 22 | Ht 62.5 in | Wt 289.0 lb

## 2011-11-14 DIAGNOSIS — M545 Low back pain: Secondary | ICD-10-CM

## 2011-11-14 MED ORDER — GABAPENTIN 100 MG PO CAPS: ORAL_CAPSULE | ORAL | Status: DC

## 2011-11-14 MED ORDER — CYCLOBENZAPRINE HCL 10 MG PO TABS: 10.0000 mg | ORAL_TABLET | Freq: Three times a day (TID) | ORAL | Status: AC | PRN

## 2011-11-14 MED ORDER — MELOXICAM 15 MG PO TABS: 15.0000 mg | ORAL_TABLET | Freq: Every day | ORAL | Status: DC
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
## 2011-11-14 NOTE — Patient Instructions (Signed)

## 2011-11-23 ENCOUNTER — Ambulatory Visit (INDEPENDENT_AMBULATORY_CARE_PROVIDER_SITE_OTHER): Payer: No Typology Code available for payment source | Admitting: Family Medicine

## 2011-11-23 ENCOUNTER — Ambulatory Visit: Payer: No Typology Code available for payment source

## 2011-11-23 VITALS — BP 142/86 | HR 102 | Temp 98.4°F | Resp 18 | Ht 62.5 in | Wt 286.4 lb

## 2011-11-23 DIAGNOSIS — R05 Cough: Secondary | ICD-10-CM

## 2011-11-23 DIAGNOSIS — R509 Fever, unspecified: Secondary | ICD-10-CM

## 2011-11-23 DIAGNOSIS — J029 Acute pharyngitis, unspecified: Secondary | ICD-10-CM

## 2011-11-23 DIAGNOSIS — J9801 Acute bronchospasm: Secondary | ICD-10-CM

## 2011-11-23 DIAGNOSIS — J189 Pneumonia, unspecified organism: Secondary | ICD-10-CM

## 2011-11-23 LAB — POCT CBC
Granulocyte percent: 70.7 %G (ref 37–80)
MID (cbc): 1.1 — AB (ref 0–0.9)
MPV: 7.4 fL (ref 0–99.8)
POC MID %: 6.8 %M (ref 0–12)
Platelet Count, POC: 481 10*3/uL — AB (ref 142–424)
RBC: 4 M/uL — AB (ref 4.04–5.48)

## 2011-11-23 LAB — POCT RAPID STREP A (OFFICE): Rapid Strep A Screen: NEGATIVE

## 2011-11-23 MED ORDER — METHYLPREDNISOLONE ACETATE 80 MG/ML IJ SUSP
80.0000 mg | Freq: Once | INTRAMUSCULAR | Status: AC
  Administered 2011-11-23: 80 mg via INTRAMUSCULAR

## 2011-11-23 MED ORDER — ALBUTEROL SULFATE HFA 108 (90 BASE) MCG/ACT IN AERS: 2.0000 | INHALATION_SPRAY | Freq: Four times a day (QID) | RESPIRATORY_TRACT | Status: DC | PRN

## 2011-11-23 MED ORDER — AZITHROMYCIN 250 MG PO TABS: ORAL_TABLET | ORAL | Status: AC

## 2011-11-23 MED ORDER — HYDROCOD POLST-CHLORPHEN POLST 10-8 MG/5ML PO LQCR: 5.0000 mL | Freq: Two times a day (BID) | ORAL | Status: DC | PRN

## 2011-11-23 MED ORDER — ALBUTEROL SULFATE (2.5 MG/3ML) 0.083% IN NEBU: 2.5000 mg | INHALATION_SOLUTION | Freq: Once | RESPIRATORY_TRACT | Status: DC

## 2011-11-23 NOTE — Patient Instructions (Signed)
Fluids, rest.  Meds as directed.  Recheck in 2 days, sooner if worse.

## 2011-11-23 NOTE — Progress Notes (Signed)
  Subjective:    Patient ID: Christine Oneal, female    DOB: 04-Mar-1971, 41 y.o.   MRN: [id number]  HPI  41 yo AAF c/o cough, wheezing, fever, bodyaches, sore throat, all X 4 days.  No h/o asthma diagnosis, but she has had to use albuterol inhalers in the past.  No N/V/D.  She is coughing up some discolored phlegm.  Review of Systems  All other systems reviewed and are negative.         Objective:   Physical Exam  Nursing note and vitals reviewed. Constitutional: She is oriented to person, place, and time. She appears well-developed and well-nourished.       Morbidly obese  HENT:  Head: Normocephalic and atraumatic.  Right Ear: External ear normal.  Left Ear: External ear normal.  Mouth/Throat: No oropharyngeal exudate (erythema w/o exudate.).  Neck: Normal range of motion. Neck supple.  Cardiovascular: Normal rate, regular rhythm and normal heart sounds.   Pulmonary/Chest: Effort normal. No respiratory distress. She has wheezes (mild and present throughout.). She has no rales. She exhibits no tenderness.  Lymphadenopathy:    She has no cervical adenopathy.  Neurological: She is alert and oriented to person, place, and time.  Skin: Skin is warm and dry.   Results for orders placed in visit on 11/23/11  POCT CBC      Component Value Range   WBC 15.7 (*) 4.6 - 10.2 (K/uL)   Lymph, poc 3.5 (*) 0.6 - 3.4    POC LYMPH PERCENT 22.5  10 - 50 (%L)   MID (cbc) 1.1 (*) 0 - 0.9    POC MID % 6.8  0 - 12 (%M)   POC Granulocyte 11.1 (*) 2 - 6.9    Granulocyte percent 70.7  37 - 80 (%G)   RBC 4.00 (*) 4.04 - 5.48 (M/uL)   Hemoglobin 11.8 (*) 12.2 - 16.2 (g/dL)   HCT, POC 56.2 (*) 13.0 - 47.9 (%)   MCV 93.0  80 - 97 (fL)   MCH, POC 29.5  27 - 31.2 (pg)   MCHC 31.7 (*) 31.8 - 35.4 (g/dL)   RDW, POC 86.5     Platelet Count, POC 481.0 (*) 142 - 424 (K/uL)   MPV 7.4  0 - 99.8 (fL)  GLUCOSE, POCT (MANUAL RESULT ENTRY)      Component Value Range   POC Glucose 94    POCT RAPID STREP A  (OFFICE)      Component Value Range   Rapid Strep A Screen Negative  Negative      [facility] reading (PRIMARY) by  Dr. Georgiana Shore as RLL infiltrate .      Assessment & Plan:  Pneumonia-see meds.

## 2011-11-24 ENCOUNTER — Telehealth: Payer: Self-pay

## 2011-11-24 NOTE — Telephone Encounter (Signed)
Pt would like to know if the results of her strep test and lab work are in yet.

## 2011-11-25 NOTE — Telephone Encounter (Signed)
Called pt, no outside labs done on this pt. Called to let her know. LMOM to CB

## 2011-11-26 NOTE — Telephone Encounter (Signed)
ADVISED PT OF INHOUSE LAB WORK

## 2011-12-12 ENCOUNTER — Other Ambulatory Visit: Payer: Self-pay | Admitting: Family Medicine

## 2011-12-16 ENCOUNTER — Telehealth: Payer: Self-pay

## 2011-12-16 NOTE — Telephone Encounter (Signed)
Wants to be sure that the detox/cleanse she is taking is safe.

## 2011-12-17 NOTE — Telephone Encounter (Signed)
Lm vm for pt to call back Veva Grimley

## 2011-12-19 ENCOUNTER — Ambulatory Visit: Payer: No Typology Code available for payment source

## 2011-12-19 ENCOUNTER — Ambulatory Visit (INDEPENDENT_AMBULATORY_CARE_PROVIDER_SITE_OTHER): Payer: No Typology Code available for payment source | Admitting: Family Medicine

## 2011-12-19 VITALS — BP 150/81 | HR 99 | Temp 98.2°F | Resp 22 | Ht 63.0 in | Wt 280.6 lb

## 2011-12-19 DIAGNOSIS — R05 Cough: Secondary | ICD-10-CM

## 2011-12-19 DIAGNOSIS — J9801 Acute bronchospasm: Secondary | ICD-10-CM

## 2011-12-19 DIAGNOSIS — K5900 Constipation, unspecified: Secondary | ICD-10-CM

## 2011-12-19 LAB — POCT CBC
HCT, POC: 39.4 % (ref 37.7–47.9)
Hemoglobin: 12.4 g/dL (ref 12.2–16.2)
MCH, POC: 29.5 pg (ref 27–31.2)
MCV: 93.5 fL (ref 80–97)
RBC: 4.21 M/uL (ref 4.04–5.48)
WBC: 10.1 10*3/uL (ref 4.6–10.2)

## 2011-12-19 MED ORDER — BENZONATATE 100 MG PO CAPS: 100.0000 mg | ORAL_CAPSULE | Freq: Three times a day (TID) | ORAL | Status: AC | PRN

## 2011-12-19 MED ORDER — AZITHROMYCIN 250 MG PO TABS: ORAL_TABLET | ORAL | Status: AC

## 2011-12-19 NOTE — Telephone Encounter (Signed)
LMOM to CB. 

## 2011-12-19 NOTE — Telephone Encounter (Signed)
Patient called back and stated that she is using an all natural green bean extract cleanse.  Did not know if this is safe for her to take.  The bottle states that you should not use if you are having rectal bleeding, she is currently having bleeding from her hemorrhoids.    Also, she states that she feels like she is getting sick again-- recently had pneumonia and chest is feeling "not right again".  Advised RTC for follow up, and that way we can discuss the cleanse with her at that time as well.  Patient understood.

## 2011-12-19 NOTE — Patient Instructions (Signed)
If continued use of albuterol needed next 2 - 3 days, return to clinic.   fluids, relative rest, tessalon perles up to 3 times per day as needed for cough.  If not improving in a few days, can fill antibiotic.  Continue miralax - can increase to twice per day for 2 days if needed, increase fluids and fiber, and can add colace to soften stool.  Return to the clinic or go to the nearest emergency room if any of your symptoms worsen or new symptoms occur.   Constipation in Adults Constipation is having fewer than 2 bowel movements per week. Usually, the stools are hard. As we grow older, constipation is more common. If you try to fix constipation with laxatives, the problem may get worse. This is because laxatives taken over a long period of time make the colon muscles weaker. A low-fiber diet, not taking in enough fluids, and taking some medicines may make these problems worse. MEDICATIONS THAT MAY CAUSE CONSTIPATION  Water pills (diuretics).   Calcium channel blockers (used to control blood pressure and for the heart).   Certain pain medicines (narcotics).   Anticholinergics.   Anti-inflammatory agents.   Antacids that contain aluminum.  DISEASES THAT CONTRIBUTE TO CONSTIPATION  Diabetes.   Parkinson's disease.   Dementia.   Stroke.   Depression.   Illnesses that cause problems with salt and water metabolism.  HOME CARE INSTRUCTIONS   Constipation is usually best cared for without medicines. Increasing dietary fiber and eating more fruits and vegetables is the best way to manage constipation.   Slowly increase fiber intake to 25 to 38 grams per day. Whole grains, fruits, vegetables, and legumes are good sources of fiber. A dietitian can further help you incorporate high-fiber foods into your diet.   Drink enough water and fluids to keep your urine clear or pale yellow.   A fiber supplement may be added to your diet if you cannot get enough fiber from foods.   Increasing  your activities also helps improve regularity.   Suppositories, as suggested by your caregiver, will also help. If you are using antacids, such as aluminum or calcium containing products, it will be helpful to switch to products containing magnesium if your caregiver says it is okay.   If you have been given a liquid injection (enema) today, this is only a temporary measure. It should not be relied on for treatment of longstanding (chronic) constipation.   Stronger measures, such as magnesium sulfate, should be avoided if possible. This may cause uncontrollable diarrhea. Using magnesium sulfate may not allow you time to make it to the bathroom.  SEEK IMMEDIATE MEDICAL CARE IF:   There is bright red blood in the stool.   The constipation stays for more than 4 days.   There is belly (abdominal) or rectal pain.   You do not seem to be getting better.   You have any questions or concerns.  MAKE SURE YOU:   Understand these instructions.   Will watch your condition.   Will get help right away if you are not doing well or get worse.  Document Released: 03/26/2004 Document Revised: 06/17/2011 Document Reviewed: 06/01/2011 [facility] Patient Information 2012 [place], [place].Cough, Adult  A cough is a reflex that helps clear your throat and airways. It can help heal the body or may be a reaction to an irritated airway. A cough may only last 2 or 3 weeks (acute) or may last more than 8 weeks (chronic).  CAUSES Acute  cough:  Viral or bacterial infections.  Chronic cough:  Infections.   Allergies.   Asthma.   Post-nasal drip.   Smoking.   Heartburn or acid reflux.   Some medicines.   Chronic lung problems (COPD).   Cancer.  SYMPTOMS   Cough.   Fever.   Chest pain.   Increased breathing rate.   High-pitched whistling sound when breathing (wheezing).   Colored mucus that you cough up (sputum).  TREATMENT   A bacterial cough may be treated with antibiotic medicine.     A viral cough must run its course and will not respond to antibiotics.   Your caregiver may recommend other treatments if you have a chronic cough.  HOME CARE INSTRUCTIONS   Only take over-the-counter or prescription medicines for pain, discomfort, or fever as directed by your caregiver. Use cough suppressants only as directed by your caregiver.   Use a cold steam vaporizer or humidifier in your bedroom or home to help loosen secretions.   Sleep in a semi-upright position if your cough is worse at night.   Rest as needed.   Stop smoking if you smoke.  SEEK IMMEDIATE MEDICAL CARE IF:   You have pus in your sputum.   Your cough starts to worsen.   You cannot control your cough with suppressants and are losing sleep.   You begin coughing up blood.   You have difficulty breathing.   You develop pain which is getting worse or is uncontrolled with medicine.   You have a fever.  MAKE SURE YOU:   Understand these instructions.   Will watch your condition.   Will get help right away if you are not doing well or get worse.  Document Released: 12/25/2010 Document Revised: 06/17/2011 Document Reviewed: 12/25/2010 [facility] Patient Information 2012 [place], [place].

## 2011-12-19 NOTE — Progress Notes (Signed)
Subjective:    Patient ID: Christine Oneal, female    DOB: 18-Feb-1971, 41 y.o.   MRN: [id number]  HPI Christine Oneal is a 41 y.o. female Past 2 days - cough restarted, more sob with wheeze- using inhaler every 6 hours yesterday and today.  No fevers.  Cough - thick yellow sputum.   No known hx of allergic rhinitis, and not needing inhaler prior to 2 days ago.    Daughter - had cold 2 weeks ago.  Dx with RLL pna  11/23/11 - WBC 15.7, seen then for "cough, wheezing, fever, bodyaches, sore throat, all X 4 days.  No h/o asthma diagnosis, but she has had to use albuterol inhalers in the past.  No N/V/D.  She is coughing up some discolored phlegm." - rx zpak, proventil, tussionex.  Improved wihin a week. Had been doing ok until 2 days ago.  Xray report negative then.  Hx pna with bronchospasm in 08/2010 - treated with omnicef, then rocephin and azithro, and albuterol.  Constipated for a few weeks - hard stools every other day.  Tried miralax once per day.  Helped some. Ordered a green bean extract for cleanse, but not taken as hx of hemorrhoids, and instructed not to take if hx of rectal bleeding.   Review of Systems  Respiratory: Positive for cough and shortness of breath.   Cardiovascular:       Sore in upper chest and back with cough.        Objective:   Physical Exam  Constitutional: She is oriented to person, place, and time. She appears well-developed and well-nourished. No distress.       Overweight/obese.   HENT:  Head: Normocephalic and atraumatic.  Right Ear: Hearing, tympanic membrane, external ear and ear canal normal.  Left Ear: Hearing, tympanic membrane, external ear and ear canal normal.  Nose: Nose normal.  Mouth/Throat: Oropharynx is clear and moist. No oropharyngeal exudate.  Eyes: Conjunctivae and EOM are normal. Pupils are equal, round, and reactive to light.  Cardiovascular: Normal rate, regular rhythm, normal heart sounds and intact distal pulses.   No murmur  heard. Pulmonary/Chest: Effort normal and breath sounds normal. No respiratory distress. She has no wheezes. She has no rhonchi.       Distant, but clear breath sounds.   Abdominal: Soft.  Neurological: She is alert and oriented to person, place, and time.  Skin: Skin is warm and dry. No rash noted.  Psychiatric: She has a normal mood and affect. Her behavior is normal.   Results for orders placed in visit on 12/19/11  POCT CBC      Component Value Range   WBC 10.1  4.6 - 10.2 (K/uL)   Lymph, poc 2.5  0.6 - 3.4    POC LYMPH PERCENT 24.5  10 - 50 (%L)   MID (cbc) 0.6  0 - 0.9    POC MID % 5.9  0 - 12 (%M)   POC Granulocyte 7.0 (*) 2 - 6.9    Granulocyte percent 69.6  37 - 80 (%G)   RBC 4.21  4.04 - 5.48 (M/uL)   Hemoglobin 12.4  12.2 - 16.2 (g/dL)   HCT, POC 40.9  81.1 - 47.9 (%)   MCV 93.5  80 - 97 (fL)   MCH, POC 29.5  27 - 31.2 (pg)   MCHC 31.5 (*) 31.8 - 35.4 (g/dL)   RDW, POC 91.4     Platelet Count, POC 477 (*) 142 - 424 (  K/uL)   MPV 7.5  0 - 99.8 (fL)   [facility] reading (PRIMARY) by  Dr. Neva Seat: CXR:.NAD.       Assessment & Plan:  Christine Oneal is a 40 y.o. female 1. Cough  POCT CBC, DG Chest 2 View  2. Bronchospasm    3. Constipation     Cough -    Suspected viral uri with bronchospasm, but no wheeze on exam currently and last inhaler use this am - no steroids at this point, but if continued use   Of albuterol next 2 days- rtc.  May have a component of obesity hypoventilation syndrome that may flair with infection.  Sx care - albuterol, fluids, relative rest, tessalon perles tid prn.  If not improving in few days, can fill Z pak # 1.   Constipation - h/o from UTD. Cont miralax - can increase to BID for 2 days if needed, increase fluids and fiber, and can add colace to soften stool.  Recommended against cleanser.  rtc if not improving.  oow note for tonight.

## 2012-08-25 ENCOUNTER — Encounter (HOSPITAL_COMMUNITY): Payer: Self-pay | Admitting: Emergency Medicine

## 2012-08-25 DIAGNOSIS — N201 Calculus of ureter: Secondary | ICD-10-CM | POA: Insufficient documentation

## 2012-08-25 DIAGNOSIS — E669 Obesity, unspecified: Secondary | ICD-10-CM | POA: Insufficient documentation

## 2012-08-25 DIAGNOSIS — I10 Essential (primary) hypertension: Secondary | ICD-10-CM | POA: Insufficient documentation

## 2012-08-25 DIAGNOSIS — Z79899 Other long term (current) drug therapy: Secondary | ICD-10-CM | POA: Insufficient documentation

## 2012-08-25 DIAGNOSIS — R350 Frequency of micturition: Secondary | ICD-10-CM | POA: Insufficient documentation

## 2012-08-25 DIAGNOSIS — Z3202 Encounter for pregnancy test, result negative: Secondary | ICD-10-CM | POA: Insufficient documentation

## 2012-08-25 DIAGNOSIS — M549 Dorsalgia, unspecified: Secondary | ICD-10-CM | POA: Insufficient documentation

## 2012-08-25 LAB — CBC WITH DIFFERENTIAL/PLATELET
Basophils Absolute: 0.1 10*3/uL (ref 0.0–0.1)
Basophils Relative: 0 % (ref 0–1)
Eosinophils Relative: 0 % (ref 0–5)
HCT: 37 % (ref 36.0–46.0)
MCHC: 34.1 g/dL (ref 30.0–36.0)
Monocytes Absolute: 0.7 10*3/uL (ref 0.1–1.0)
Neutro Abs: 10.7 10*3/uL — ABNORMAL HIGH (ref 1.7–7.7)
RDW: 15 % (ref 11.5–15.5)

## 2012-08-25 LAB — URINALYSIS, MICROSCOPIC ONLY
Glucose, UA: NEGATIVE mg/dL
Hgb urine dipstick: NEGATIVE
Leukocytes, UA: NEGATIVE
Nitrite: NEGATIVE
Specific Gravity, Urine: 1.009 (ref 1.005–1.030)
Urobilinogen, UA: 0.2 mg/dL (ref 0.0–1.0)

## 2012-08-25 NOTE — ED Notes (Signed)
C/o RLQ cramping and lower back pain x 1 week.  Denies nausea, vomiting, and diarrhea.  Reports frequent urination.

## 2012-08-26 ENCOUNTER — Emergency Department (HOSPITAL_COMMUNITY): Payer: Self-pay

## 2012-08-26 ENCOUNTER — Emergency Department (HOSPITAL_COMMUNITY)
Admission: EM | Admit: 2012-08-26 | Discharge: 2012-08-26 | Disposition: A | Discharge status: Home / Self Care | Payer: Self-pay | Attending: Emergency Medicine | Admitting: Emergency Medicine

## 2012-08-26 ENCOUNTER — Encounter (HOSPITAL_COMMUNITY): Payer: Self-pay | Admitting: Radiology

## 2012-08-26 DIAGNOSIS — N201 Calculus of ureter: Secondary | ICD-10-CM

## 2012-08-26 LAB — COMPREHENSIVE METABOLIC PANEL
AST: 24 U/L (ref 0–37)
Albumin: 3.4 g/dL — ABNORMAL LOW (ref 3.5–5.2)
Calcium: 10 mg/dL (ref 8.4–10.5)
Chloride: 105 mEq/L (ref 96–112)
Creatinine, Ser: 0.8 mg/dL (ref 0.50–1.10)
Total Protein: 7.7 g/dL (ref 6.0–8.3)

## 2012-08-26 MED ORDER — ONDANSETRON HCL 4 MG/2ML IJ SOLN
4.0000 mg | Freq: Once | INTRAMUSCULAR | Status: AC
  Administered 2012-08-26: 4 mg via INTRAVENOUS
  Filled 2012-08-26: qty 2

## 2012-08-26 MED ORDER — IOHEXOL 300 MG/ML  SOLN
20.0000 mL | INTRAMUSCULAR | Status: AC
  Administered 2012-08-26: 50 mL via ORAL

## 2012-08-26 MED ORDER — HYDROMORPHONE HCL PF 1 MG/ML IJ SOLN
1.0000 mg | Freq: Once | INTRAMUSCULAR | Status: AC
  Administered 2012-08-26: 1 mg via INTRAVENOUS
  Filled 2012-08-26: qty 1

## 2012-08-26 MED ORDER — SODIUM CHLORIDE 0.9 % IV BOLUS (SEPSIS)
250.0000 mL | Freq: Once | INTRAVENOUS | Status: AC
  Administered 2012-08-26: 250 mL via INTRAVENOUS

## 2012-08-26 MED ORDER — HYDROCODONE-ACETAMINOPHEN 5-325 MG PO TABS: 1.0000 | ORAL_TABLET | Freq: Four times a day (QID) | ORAL | Status: DC | PRN

## 2012-08-26 MED ORDER — SODIUM CHLORIDE 0.9 % IV SOLN
INTRAVENOUS | Status: DC
  Administered 2012-08-26 (×2): via INTRAVENOUS

## 2012-08-26 MED ORDER — IBUPROFEN 800 MG PO TABS: 800.0000 mg | ORAL_TABLET | Freq: Three times a day (TID) | ORAL | Status: DC

## 2012-08-26 MED ORDER — DEXTROSE 5 % IV SOLN
1.0000 g | Freq: Once | INTRAVENOUS | Status: AC
  Administered 2012-08-26: 1 g via INTRAVENOUS
  Filled 2012-08-26: qty 10

## 2012-08-26 MED ORDER — PROMETHAZINE HCL 25 MG PO TABS: 25.0000 mg | ORAL_TABLET | Freq: Four times a day (QID) | ORAL | Status: DC | PRN

## 2012-08-26 MED ORDER — CEPHALEXIN 500 MG PO CAPS: 500.0000 mg | ORAL_CAPSULE | Freq: Four times a day (QID) | ORAL | Status: DC

## 2012-08-26 MED ORDER — IOHEXOL 300 MG/ML  SOLN
100.0000 mL | Freq: Once | INTRAMUSCULAR | Status: AC | PRN
  Administered 2012-08-26: 100 mL via INTRAVENOUS

## 2012-08-26 NOTE — ED Notes (Signed)
Back from CT

## 2012-08-26 NOTE — ED Notes (Signed)
Pt to CT

## 2012-08-26 NOTE — ED Notes (Signed)
Pt ambulatory to b/r, steady gait, alert, NAD, calm.

## 2012-08-26 NOTE — ED Notes (Signed)
Pt discharged home. Has no further questions at the time. Vital signs stable.

## 2012-08-26 NOTE — ED Provider Notes (Signed)
History     CSN: [id number]  Arrival date & time 08/25/12  2251   First MD Initiated Contact with Patient 08/26/12 0229      Chief Complaint  Patient presents with  . Abdominal Pain    (Consider location/radiation/quality/duration/timing/severity/associated sxs/prior treatment) Patient is a 42 y.o. female presenting with abdominal pain. The history is provided by the patient.  Abdominal Pain Associated symptoms: no chest pain, no diarrhea, no dysuria, no fever, no hematuria, no nausea, no shortness of breath and no vomiting    The patient's last menstrual period of was 07/18/2012. Patient with a complaint of right lower quadrant, pain radiating to the back for a week. Pain is 10/10 it's intermittent. Described as crampy.  Patient also has urinary frequency. Never had pain like this before. Pain does radiate to the back.  Past Medical History  Diagnosis Date  . Hypertension   . Obesity     Past Surgical History  Procedure Laterality Date  . Tubal ligation    . Tubal ligation      Family History  Problem Relation Age of Onset  . Hypertension Mother   . Hypertension Father   . Diabetes Sister   . Hypertension Brother     History  Substance Use Topics  . Smoking status: Never Smoker   . Smokeless tobacco: Not on file  . Alcohol Use: No    OB History   Grav Para Term Preterm Abortions TAB SAB Ect Mult Living                  Review of Systems  Constitutional: Negative for fever.  HENT: Negative for congestion.   Eyes: Negative for visual disturbance.  Respiratory: Negative for shortness of breath.   Cardiovascular: Negative for chest pain.  Gastrointestinal: Positive for abdominal pain. Negative for nausea, vomiting and diarrhea.  Genitourinary: Positive for frequency. Negative for dysuria and hematuria.  Musculoskeletal: Positive for back pain.  Skin: Negative for rash.  Neurological: Negative for headaches.  Hematological: Does not bruise/bleed easily.     Allergies  Lisinopril-hydrochlorothiazide  Home Medications   Current Outpatient Rx  Name  Route  Sig  Dispense  Refill  . acetaminophen (TYLENOL) 325 MG tablet   Oral   Take 650 mg by mouth every 6 (six) hours as needed for pain.          Marland Kitchen albuterol (PROVENTIL HFA;VENTOLIN HFA) 108 (90 BASE) MCG/ACT inhaler   Inhalation   Inhale 2 puffs into the lungs every 6 (six) hours as needed for wheezing.   1 Inhaler   2   . amLODipine (NORVASC) 10 MG tablet   Oral   Take 1 tablet (10 mg total) by mouth daily.   90 tablet   3   . cephALEXin (KEFLEX) 500 MG capsule   Oral   Take 1 capsule (500 mg total) by mouth 4 (four) times daily.   28 capsule   0   . HYDROcodone-acetaminophen (NORCO/VICODIN) 5-325 MG per tablet   Oral   Take 1-2 tablets by mouth every 6 (six) hours as needed for pain.   20 tablet   0   . ibuprofen (ADVIL,MOTRIN) 800 MG tablet   Oral   Take 1 tablet (800 mg total) by mouth 3 (three) times daily.   21 tablet   0   . promethazine (PHENERGAN) 25 MG tablet   Oral   Take 1 tablet (25 mg total) by mouth every 6 (six) hours as needed for nausea.  12 tablet   0     BP 166/105  Pulse 113  Temp(Src) 98.4 F (36.9 C) (Oral)  Resp 22  SpO2 100%  LMP 07/18/2012  Physical Exam  Nursing note and vitals reviewed. Constitutional: She is oriented to person, place, and time. She appears well-developed and well-nourished.  HENT:  Head: Normocephalic and atraumatic.  Mouth/Throat: Oropharynx is clear and moist.  Eyes: Conjunctivae and EOM are normal. Pupils are equal, round, and reactive to light.  Neck: Normal range of motion. Neck supple.  Cardiovascular: Normal rate, regular rhythm and normal heart sounds.   No murmur heard. Pulmonary/Chest: Effort normal and breath sounds normal.  Abdominal: Soft. Bowel sounds are normal. There is no tenderness.  Musculoskeletal: Normal range of motion. She exhibits no edema and no tenderness.  Neurological: She  is alert and oriented to person, place, and time. No cranial nerve deficit. She exhibits normal muscle tone. Coordination normal.  Skin: Skin is warm. No rash noted.    ED Course  Procedures (including critical care time)  Labs Reviewed  CBC WITH DIFFERENTIAL - Abnormal; Notable for the following:    WBC 13.3 (*)    Platelets 432 (*)    Neutrophils Relative 80 (*)    Neutro Abs 10.7 (*)    All other components within normal limits  COMPREHENSIVE METABOLIC PANEL - Abnormal; Notable for the following:    Glucose, Bld 120 (*)    Albumin 3.4 (*)    GFR calc non Af Amer 89 (*)    All other components within normal limits  URINE CULTURE  URINALYSIS, MICROSCOPIC ONLY  POCT PREGNANCY, URINE   Ct Abdomen Pelvis W Contrast  08/26/2012  *RADIOLOGY REPORT*  Clinical Data: Right lower quadrant abdominal pain and constipation.  CT ABDOMEN AND PELVIS WITH CONTRAST  Technique:  Multidetector CT imaging of the abdomen and pelvis was performed following the standard protocol during bolus administration of intravenous contrast.  Contrast: OMNIPAQUE IOHEXOL 300 MG/ML  SOLN  Comparison: Abdominal ultrasound performed 07/22/2006  Findings: The visualized lung bases are clear.  The liver and spleen are unremarkable in appearance.  The gallbladder is within normal limits.  The pancreas and adrenal glands are unremarkable.  There is diffusely decreased attenuation with respect to the right kidney, with mild right-sided hydronephrosis, and prominence of the right ureter along its entire course.  An obstructing 4 mm stone is noted distally, at the right vesicoureteral junction.  Abnormal right renal enhancement raises concern for underlying pyelonephritis.  The left kidney is unremarkable in appearance.  No nonobstructing renal stones are identified.  No free fluid is identified.  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  The small bowel is otherwise unremarkable in  appearance.  The stomach is within normal limits.  No acute vascular abnormalities are seen.  The appendix is normal in caliber, without evidence for appendicitis.  The colon is unremarkable in appearance.  The bladder is mildly distended and grossly unremarkable in appearance.  A 4.2 cm intramural fibroid is noted along the anterior aspect of the uterus; the uterus is otherwise unremarkable in appearance.  The ovaries are grossly unremarkable; no suspicious adnexal masses are seen.  No inguinal lymphadenopathy is seen.  No acute osseous abnormalities are identified.  IMPRESSION:  1.  Mild right-sided hydronephrosis, with an obstructing 4 mm stone noted distally at the right vesicoureteral junction. 2.  Diffusely decreased right renal attenuation raises concern for underlying pyelonephritis. 3.  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  4.  Uterine fibroid noted.   Original Report Authenticated By: Tonia Ghent, M.D.    Results for orders placed during the hospital encounter of 08/26/12  CBC WITH DIFFERENTIAL      Result Value Range   WBC 13.3 (*) 4.0 - 10.5 K/uL   RBC 4.12  3.87 - 5.11 MIL/uL   Hemoglobin 12.6  12.0 - 15.0 g/dL   HCT 40.9  81.1 - 91.4 %   MCV 89.8  78.0 - 100.0 fL   MCH 30.6  26.0 - 34.0 pg   MCHC 34.1  30.0 - 36.0 g/dL   RDW 78.2  95.6 - 21.3 %   Platelets 432 (*) 150 - 400 K/uL   Neutrophils Relative 80 (*) 43 - 77 %   Neutro Abs 10.7 (*) 1.7 - 7.7 K/uL   Lymphocytes Relative 14  12 - 46 %   Lymphs Abs 1.9  0.7 - 4.0 K/uL   Monocytes Relative 5  3 - 12 %   Monocytes Absolute 0.7  0.1 - 1.0 K/uL   Eosinophils Relative 0  0 - 5 %   Eosinophils Absolute 0.1  0.0 - 0.7 K/uL   Basophils Relative 0  0 - 1 %   Basophils Absolute 0.1  0.0 - 0.1 K/uL  COMPREHENSIVE METABOLIC PANEL      Result Value Range   Sodium 141  135 - 145 mEq/L   Potassium 3.8  3.5 - 5.1 mEq/L   Chloride 105  96 - 112 mEq/L   CO2 27  19 - 32 mEq/L   Glucose, Bld  120 (*) 70 - 99 mg/dL   BUN 9  6 - 23 mg/dL   Creatinine, Ser 0.86  0.50 - 1.10 mg/dL   Calcium 57.8  8.4 - 46.9 mg/dL   Total Protein 7.7  6.0 - 8.3 g/dL   Albumin 3.4 (*) 3.5 - 5.2 g/dL   AST 24  0 - 37 U/L   ALT 23  0 - 35 U/L   Alkaline Phosphatase 114  39 - 117 U/L   Total Bilirubin 0.4  0.3 - 1.2 mg/dL   GFR calc non Af Amer 89 (*) >90 mL/min   GFR calc Af Amer >90  >90 mL/min  URINALYSIS, MICROSCOPIC ONLY      Result Value Range   Color, Urine YELLOW  YELLOW   APPearance CLEAR  CLEAR   Specific Gravity, Urine 1.009  1.005 - 1.030   pH 7.5  5.0 - 8.0   Glucose, UA NEGATIVE  NEGATIVE mg/dL   Hgb urine dipstick NEGATIVE  NEGATIVE   Bilirubin Urine NEGATIVE  NEGATIVE   Ketones, ur NEGATIVE  NEGATIVE mg/dL   Protein, ur NEGATIVE  NEGATIVE mg/dL   Urobilinogen, UA 0.2  0.0 - 1.0 mg/dL   Nitrite NEGATIVE  NEGATIVE   Leukocytes, UA NEGATIVE  NEGATIVE   WBC, UA 0-2  <3 WBC/hpf   RBC / HPF 0-2  <3 RBC/hpf   Bacteria, UA RARE  RARE   Squamous Epithelial / LPF RARE  RARE  POCT PREGNANCY, URINE      Result Value Range   Preg Test, Ur NEGATIVE  NEGATIVE     1. Right ureteral stone       MDM  Patient with right lower quadrant pain for the past several days. CT confirms a 4 mm kidney stone ureteral stone near the bladder. Suspect to be a pass it shortly. Also raises  concern for pyelonephritis. Patient has had discomfort for a while also will treat with antibiotic. Patient with leukocytosis leukocytosis, no fever. Urine culture was sent and is pending obviously. Patient given Rocephin in ED will be sent home with Keflex referral to urology as well as hydrocodone Phenergan and 800 mg of Motrin. The patient's first history of a kidney stone. 3 see test was negative electrolytes and creatinine was normal.       Shelda Jakes, MD 08/26/12 [phone]

## 2012-08-26 NOTE — ED Notes (Signed)
Alert, NAD, calm, interactive, resps e/u, speaking in clear complete sentences, c/o RLQ pain and some low back pain, onset ~ 1 week ago, last ate 2000, last BM ~2000 (normal), last void in ED (triage sample), (denies: bleeding, nvd, fever, urinary or vaginal sx), LMP 07/18/12, have spotted on & off since then. Tried tylenol & motrin w/o significant relief. Denies surgeries other than tubal ligation. No hernias. Lab results reviewed.

## 2012-08-27 LAB — URINE CULTURE: Culture: NO GROWTH

## 2012-10-13 ENCOUNTER — Other Ambulatory Visit: Payer: Self-pay | Admitting: Internal Medicine

## 2013-01-02 ENCOUNTER — Other Ambulatory Visit: Payer: Self-pay | Admitting: Internal Medicine

## 2013-03-09 ENCOUNTER — Other Ambulatory Visit: Payer: Self-pay | Admitting: Physician Assistant

## 2013-06-06 ENCOUNTER — Telehealth: Payer: Self-pay

## 2013-06-06 MED ORDER — AMLODIPINE BESYLATE 10 MG PO TABS: 10.0000 mg | ORAL_TABLET | Freq: Every day | ORAL | Status: DC

## 2013-06-06 NOTE — Telephone Encounter (Signed)
[facility] - Pt. Needs a refill on her BP medicine.  She will be here to see you next Friday, Dec. 5, but she wants to know if you can refill it now.  [phone]

## 2013-06-06 NOTE — Telephone Encounter (Signed)
Sent in, advised patient to keep follow up as planned.

## 2013-08-21 ENCOUNTER — Ambulatory Visit (INDEPENDENT_AMBULATORY_CARE_PROVIDER_SITE_OTHER): Payer: BC Managed Care – PPO | Admitting: Family Medicine

## 2013-08-21 VITALS — BP 150/96 | HR 86 | Temp 98.0°F | Resp 16 | Ht 62.75 in | Wt 290.0 lb

## 2013-08-21 DIAGNOSIS — I10 Essential (primary) hypertension: Secondary | ICD-10-CM

## 2013-08-21 DIAGNOSIS — D72829 Elevated white blood cell count, unspecified: Secondary | ICD-10-CM

## 2013-08-21 DIAGNOSIS — N946 Dysmenorrhea, unspecified: Secondary | ICD-10-CM

## 2013-08-21 LAB — CBC
HEMATOCRIT: 32.3 % — AB (ref 36.0–46.0)
Hemoglobin: 10.5 g/dL — ABNORMAL LOW (ref 12.0–15.0)
MCH: 29.2 pg (ref 26.0–34.0)
MCHC: 32.5 g/dL (ref 30.0–36.0)
MCV: 89.7 fL (ref 78.0–100.0)
PLATELETS: 410 10*3/uL — AB (ref 150–400)
RBC: 3.6 MIL/uL — AB (ref 3.87–5.11)
RDW: 16.9 % — ABNORMAL HIGH (ref 11.5–15.5)
WBC: 11.2 10*3/uL — AB (ref 4.0–10.5)

## 2013-08-21 LAB — BASIC METABOLIC PANEL
BUN: 10 mg/dL (ref 6–23)
CHLORIDE: 106 meq/L (ref 96–112)
CO2: 28 meq/L (ref 19–32)
CREATININE: 0.59 mg/dL (ref 0.50–1.10)
Calcium: 9.3 mg/dL (ref 8.4–10.5)
Glucose, Bld: 87 mg/dL (ref 70–99)
POTASSIUM: 3.8 meq/L (ref 3.5–5.3)
Sodium: 141 mEq/L (ref 135–145)

## 2013-08-21 MED ORDER — AMLODIPINE BESYLATE 10 MG PO TABS: 10.0000 mg | ORAL_TABLET | Freq: Every day | ORAL | Status: DC

## 2013-08-21 MED ORDER — IBUPROFEN 800 MG PO TABS: 800.0000 mg | ORAL_TABLET | Freq: Three times a day (TID) | ORAL | Status: DC

## 2013-08-21 NOTE — Patient Instructions (Signed)
Take care- good to see you again!  Please come and see me for a physical exam when you can.   Also, please sign up for mychart- see info in the back

## 2013-08-21 NOTE — Progress Notes (Addendum)
[facility] [address] [place] [address] [phone]  Date:  08/21/2013   Name:  Christine Oneal   DOB:  [birth year]   MRN:  [id number]  PCP:  Lamar Blinks, MD    Chief Complaint: Medication Refill   History of Present Illness:  Christine Oneal is a 43 y.o. very pleasant female patient who presents with the following:  She is here today for a refill of her BP medication. She has been out for about 2 weeks- we last saw her in June of 2013.  She had been on amlodipine 10mg .  Her BP would run around 130/80 while on the medication.    She also needs a RF of her ibuprofen 800 which she uses prn for menstrual cramps.  She uses this for about 5 days per cycle- it is helpful to her  Otherwise she is doing well, has no concerns but does plan to come and see [place] for a CPE soon  Patient Active Problem List   Diagnosis Date Noted  . HYPERTENSION 10/24/2007  . EXTERNAL HEMORRHOIDS 10/24/2007  . ANAL FISSURE 10/24/2007    Past Medical History  Diagnosis Date  . Hypertension   . Obesity   . Anemia     Past Surgical History  Procedure Laterality Date  . Tubal ligation    . Tubal ligation      History  Substance Use Topics  . Smoking status: Never Smoker   . Smokeless tobacco: Not on file  . Alcohol Use: No    Family History  Problem Relation Age of Onset  . Hypertension Mother   . Cancer Mother   . Hypertension Father   . Stroke Father   . Diabetes Sister   . Hypertension Brother   . Cancer Maternal Grandmother     Allergies  Allergen Reactions  . Lisinopril-Hydrochlorothiazide Anaphylaxis    Medication list has been reviewed and updated.  Current Outpatient Prescriptions on File Prior to Visit  Medication Sig Dispense Refill  . amLODipine (NORVASC) 10 MG tablet Take 1 tablet (10 mg total) by mouth daily. Needs office visit for further refills  30 tablet  0  . acetaminophen (TYLENOL) 325 MG tablet Take 650 mg by mouth every 6 (six)  hours as needed for pain.       Marland Kitchen albuterol (PROVENTIL HFA;VENTOLIN HFA) 108 (90 BASE) MCG/ACT inhaler Inhale 2 puffs into the lungs every 6 (six) hours as needed for wheezing.  1 Inhaler  2  . cephALEXin (KEFLEX) 500 MG capsule Take 1 capsule (500 mg total) by mouth 4 (four) times daily.  28 capsule  0  . HYDROcodone-acetaminophen (NORCO/VICODIN) 5-325 MG per tablet Take 1-2 tablets by mouth every 6 (six) hours as needed for pain.  20 tablet  0  . ibuprofen (ADVIL,MOTRIN) 800 MG tablet Take 1 tablet (800 mg total) by mouth 3 (three) times daily.  21 tablet  0  . promethazine (PHENERGAN) 25 MG tablet Take 1 tablet (25 mg total) by mouth every 6 (six) hours as needed for nausea.  12 tablet  0   Current Facility-Administered Medications on File Prior to Visit  Medication Dose Route Frequency Provider Last Rate Last Dose  . albuterol (PROVENTIL) (2.5 MG/3ML) 0.083% nebulizer solution 2.5 mg  2.5 mg Nebulization Once [place] Donovan, PA-C        Review of Systems:  As per HPI- otherwise negative.   Physical Examination: Filed Vitals:   08/21/13 1602  BP:  150/96  Pulse: 86  Temp: 98 F (36.7 C)  Resp: 16   Filed Vitals:   08/21/13 1602  Height: 5' 2.75" (1.594 m)  Weight: 290 lb (131.543 kg)   Body mass index is 51.77 kg/(m^2). Ideal Body Weight: Weight in (lb) to have BMI = 25: 139.7  GEN: WDWN, NAD, Non-toxic, A & O x 3, obese, looks well HEENT: Atraumatic, Normocephalic. Neck supple. No masses, No LAD..  Ears and Nose: No external deformity. CV: RRR, No M/G/R. No JVD. No thrill. No extra heart sounds. PULM: CTA B, no wheezes, crackles, rhonchi. No retractions. No resp. distress. No accessory muscle use. EXTR: No c/c/e NEURO Normal gait.  PSYCH: Normally interactive. Conversant. Not depressed or anxious appearing.  Calm demeanor.    Assessment and Plan: HTN (hypertension) - Plan: amLODipine (NORVASC) 10 MG tablet, CBC, Basic metabolic panel  Menstrual cramps - Plan:  ibuprofen (ADVIL,MOTRIN) 800 MG tablet, DISCONTINUED: ibuprofen (ADVIL,MOTRIN) 800 MG tablet  Refilled medications as above, await labs.   See patient instructions for more details.     Signed Lamar Blinks, MD  2/14: called to discuss her labs.  She has had leukocytosis off and on for a couple of years.  This has not been evaluated that she can recall.  I will refer her to heme- one to have this looked at further.  Also, she is anemic.  In the past she had been seeing Dr. Phineas Real for menorrhagia.  She continues to have menorrhagia with heavy bleeding several days of the month and clots.  Asked her to follow-up with Dr. Phineas Real and she agreed to do so. Gave her his phone number.  Also of note she did have a colonoscopy done for rectal bleeding in 2008 which was normal

## 2013-08-25 NOTE — Addendum Note (Signed)
Addended by: Lamar Blinks C on: 08/25/2013 02:27 PM   Modules accepted: Orders

## 2013-08-30 ENCOUNTER — Ambulatory Visit (INDEPENDENT_AMBULATORY_CARE_PROVIDER_SITE_OTHER): Payer: BC Managed Care – PPO | Admitting: Emergency Medicine

## 2013-08-30 VITALS — BP 132/88 | HR 112 | Temp 99.0°F | Resp 18 | Ht 62.75 in | Wt 290.0 lb

## 2013-08-30 DIAGNOSIS — A088 Other specified intestinal infections: Secondary | ICD-10-CM

## 2013-08-30 MED ORDER — ONDANSETRON 8 MG PO TBDP: 8.0000 mg | ORAL_TABLET | Freq: Three times a day (TID) | ORAL | Status: DC | PRN

## 2013-08-30 MED ORDER — LOPERAMIDE HCL 2 MG PO TABS: ORAL_TABLET | ORAL | Status: DC

## 2013-08-30 NOTE — Patient Instructions (Signed)
Viral Gastroenteritis Viral gastroenteritis is also known as stomach flu. This condition affects the stomach and intestinal tract. It can cause sudden diarrhea and vomiting. The illness typically lasts 3 to 8 days. Most people develop an immune response that eventually gets rid of the virus. While this natural response develops, the virus can make you quite ill. CAUSES  Many different viruses can cause gastroenteritis, such as rotavirus or noroviruses. You can catch one of these viruses by consuming contaminated food or water. You may also catch a virus by sharing utensils or other personal items with an infected person or by touching a contaminated surface. SYMPTOMS  The most common symptoms are diarrhea and vomiting. These problems can cause a severe loss of body fluids (dehydration) and a body salt (electrolyte) imbalance. Other symptoms may include:  Fever.  Headache.  Fatigue.  Abdominal pain. DIAGNOSIS  Your caregiver can usually diagnose viral gastroenteritis based on your symptoms and a physical exam. A stool sample may also be taken to test for the presence of viruses or other infections. TREATMENT  This illness typically goes away on its own. Treatments are aimed at rehydration. The most serious cases of viral gastroenteritis involve vomiting so severely that you are not able to keep fluids down. In these cases, fluids must be given through an intravenous line (IV). HOME CARE INSTRUCTIONS   Drink enough fluids to keep your urine clear or pale yellow. Drink small amounts of fluids frequently and increase the amounts as tolerated.  Ask your caregiver for specific rehydration instructions.  Avoid:  Foods high in sugar.  Alcohol.  Carbonated drinks.  Tobacco.  Juice.  Caffeine drinks.  Extremely hot or cold fluids.  Fatty, greasy foods.  Too much intake of anything at one time.  Dairy products until 24 to 48 hours after diarrhea stops.  You may consume probiotics.  Probiotics are active cultures of beneficial bacteria. They may lessen the amount and number of diarrheal stools in adults. Probiotics can be found in yogurt with active cultures and in supplements.  Wash your hands well to avoid spreading the virus.  Only take over-the-counter or prescription medicines for pain, discomfort, or fever as directed by your caregiver. Do not give aspirin to children. Antidiarrheal medicines are not recommended.  Ask your caregiver if you should continue to take your regular prescribed and over-the-counter medicines.  Keep all follow-up appointments as directed by your caregiver. SEEK IMMEDIATE MEDICAL CARE IF:   You are unable to keep fluids down.  You do not urinate at least once every 6 to 8 hours.  You develop shortness of breath.  You notice blood in your stool or vomit. This may look like coffee grounds.  You have abdominal pain that increases or is concentrated in one small area (localized).  You have persistent vomiting or diarrhea.  You have a fever.  The patient is a child younger than 3 months, and he or she has a fever.  The patient is a child older than 3 months, and he or she has a fever and persistent symptoms.  The patient is a child older than 3 months, and he or she has a fever and symptoms suddenly get worse.  The patient is a baby, and he or she has no tears when crying. MAKE SURE YOU:   Understand these instructions.  Will watch your condition.  Will get help right away if you are not doing well or get worse. Document Released: 06/28/2005 Document Revised: 09/20/2011 Document Reviewed: 04/14/2011   ExitCare Patient Information 2014 [employer]. Diet The clear liquid diet consists of foods that are liquid or will become liquid at room temperature. Examples of foods allowed on a clear liquid diet include fruit juice, broth or bouillon, gelatin, or frozen ice pops. You should be able to see through the liquid. The purpose of  this diet is to provide the necessary fluids, electrolytes (such as sodium and potassium), and energy to keep the body functioning during times when you are not able to consume a regular diet. A clear liquid diet should not be continued for long periods of time, as it is not nutritionally adequate.  A CLEAR LIQUID DIET MAY BE NEEDED:  When a sudden-onset (acute) condition occurs before or after surgery.   As the first step in oral feeding.   For fluid and electrolyte replacement in diarrheal diseases.   As a diet before certain medical tests are performed.  ADEQUACY The clear liquid diet is adequate only in ascorbic acid, according to the Recommended Dietary Allowances of the National Research Council.  CHOOSING FOODS Breads and Starches  Allowed: None are allowed.   Avoid: All are to be avoided.  Vegetables  Allowed: Strained vegetable juices.   Avoid: Any others.  Fruit  Allowed: Strained fruit juices and fruit drinks. Include 1 serving of citrus or vitamin C-enriched fruit juice daily.   Avoid: Any others.  Meat and Meat Substitutes  Allowed: None are allowed.   Avoid: All are to be avoided.  Milk Products  Allowed: None are allowed.   Avoid: All are to be avoided.  Soups and Combination Foods  Allowed: Clear bouillon, broth, or strained broth-based soups.   Avoid: Any others.  Desserts and Sweets  Allowed: Sugar, honey. High-protein gelatin. Flavored gelatin, ices, or frozen ice pops that do not contain milk.   Avoid: Any others.  Fats and Oils  Allowed: None are allowed.   Avoid: All are to be avoided.  Beverages  Allowed: Cereal beverages, coffee (regular or decaffeinated), tea, or soda at the discretion of your health care provider.   Avoid: Any others.  Condiments  Allowed: Salt.   Avoid: Any others, including pepper.  Supplements  Allowed: Liquid nutrition beverages that you can see  through.   Avoid: Any others that contain lactose or fiber. SAMPLE MEAL PLAN Breakfast  4 oz (120 mL) strained orange juice.   to 1 cup (120 to 240 mL) gelatin (plain or fortified).  1 cup (240 mL) beverage (coffee or tea).  Sugar, if desired. Midmorning Snack   cup (120 mL) gelatin (plain or fortified). Lunch  1 cup (240 mL) broth or consomm.  4 oz (120 mL) strained grapefruit juice.   cup (120 mL) gelatin (plain or fortified).  1 cup (240 mL) beverage (coffee or tea).  Sugar, if desired. Midafternoon Snack   cup (120 mL) fruit ice.   cup (120 mL) strained fruit juice. Dinner  1 cup (240 mL) broth or consomm.   cup (120 mL) cranberry juice.   cup (120 mL) flavored gelatin (plain or fortified).  1 cup (240 mL) beverage (coffee or tea).  Sugar, if desired. Evening Snack  4 oz (120 mL) strained apple juice (vitamin C-fortified).   cup (120 mL) flavored gelatin (plain or fortified). MAKE SURE YOU:  Understand these instructions.  Will watch your child's condition.  Will get help right away if your child is not doing well or gets worse. Document Released: 06/28/2005 Document Revised: 02/28/2013 Document Reviewed: 11/28/2012   ExitCare Patient Information 2014 [employer].  

## 2013-08-30 NOTE — Progress Notes (Signed)
[facility] [address], [place] [address] [phone]  Date:  08/30/2013   Name:  Christine Oneal   DOB:  1971/03/13   MRN:  [id number]  PCP:  Lamar Blinks, MD    Chief Complaint: Nausea, Abdominal Pain, Chills and Generalized Body Aches   History of Present Illness:  Christine Oneal is a 43 y.o. very pleasant female patient who presents with the following:  Got sick last night at work with nausea and vomiting.  No stool change but having abdominal crampy pain.  No fever but felt chilled.  Poor po intake.  No rash or sepsis.  Has generalized body aches and fatigue.  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  The patient has no complaint of blood, mucous, or pus in her stools. No improvement with over the counter medications or other home remedies. Denies other complaint or health concern today.   Patient Active Problem List   Diagnosis Date Noted  . HYPERTENSION 10/24/2007  . EXTERNAL HEMORRHOIDS 10/24/2007  . ANAL FISSURE 10/24/2007    Past Medical History  Diagnosis Date  . Hypertension   . Obesity   . Anemia     Past Surgical History  Procedure Laterality Date  . Tubal ligation    . Tubal ligation      History  Substance Use Topics  . Smoking status: Never Smoker   . Smokeless tobacco: Not on file  . Alcohol Use: No    Family History  Problem Relation Age of Onset  . Hypertension Mother   . Cancer Mother   . Hypertension Father   . Stroke Father   . Diabetes Sister   . Hypertension Brother   . Cancer Maternal Grandmother     Allergies  Allergen Reactions  . Lisinopril-Hydrochlorothiazide Anaphylaxis    Medication list has been reviewed and updated.  Current Outpatient Prescriptions on File Prior to Visit  Medication Sig Dispense Refill  . acetaminophen (TYLENOL) 325 MG tablet Take 650 mg by mouth every 6 (six) hours as needed for pain.       Marland Kitchen amLODipine (NORVASC) 10 MG tablet Take 1 tablet (10 mg total) by mouth  daily.  90 tablet  0  . cephALEXin (KEFLEX) 500 MG capsule Take 1 capsule (500 mg total) by mouth 4 (four) times daily.  28 capsule  0  . ibuprofen (ADVIL,MOTRIN) 800 MG tablet Take 1 tablet (800 mg total) by mouth 3 (three) times daily. Prn for menstrual cramps  40 tablet  1  . promethazine (PHENERGAN) 25 MG tablet Take 1 tablet (25 mg total) by mouth every 6 (six) hours as needed for nausea.  12 tablet  0  . albuterol (PROVENTIL HFA;VENTOLIN HFA) 108 (90 BASE) MCG/ACT inhaler Inhale 2 puffs into the lungs every 6 (six) hours as needed for wheezing.  1 Inhaler  2  . HYDROcodone-acetaminophen (NORCO/VICODIN) 5-325 MG per tablet Take 1-2 tablets by mouth every 6 (six) hours as needed for pain.  20 tablet  0   Current Facility-Administered Medications on File Prior to Visit  Medication Dose Route Frequency Provider Last Rate Last Dose  . albuterol (PROVENTIL) (2.5 MG/3ML) 0.083% nebulizer solution 2.5 mg  2.5 mg Nebulization Once Argentina Donovan, PA-C        Review of Systems:  As per HPI, otherwise negative.    Physical Examination: Filed Vitals:   08/30/13 1823  BP: 132/88  Pulse: 112  Temp: 99 F (37.2 C)  Resp: 18   Filed Vitals:   08/30/13 1823  Height: 5' 2.75" (1.594 m)  Weight: 290 lb (131.543 kg)   Body mass index is 51.77 kg/(m^2). Ideal Body Weight: Weight in (lb) to have BMI = 25: 139.7  GEN: WDWN, NAD, Non-toxic, A & O x 3  dry HEENT: Atraumatic, Normocephalic. Neck supple. No masses, No LAD. Ears and Nose: No external deformity. CV: RRR, No M/G/R. No JVD. No thrill. No extra heart sounds. PULM: CTA B, no wheezes, crackles, rhonchi. No retractions. No resp. distress. No accessory muscle use. ABD: S, NT, ND, +BS. No rebound. No HSM. EXTR: No c/c/e NEURO Normal gait.  PSYCH: Normally interactive. Conversant. Not depressed or anxious appearing.  Calm demeanor.    Assessment and Plan: Gastroenteritis Imodium zofran Clears  Signed,  Ellison Carwin,  MD

## 2013-09-04 ENCOUNTER — Emergency Department (HOSPITAL_COMMUNITY)
Admission: EM | Admit: 2013-09-04 | Discharge: 2013-09-04 | Disposition: A | Discharge status: Home / Self Care | Payer: BC Managed Care – PPO | Attending: Emergency Medicine | Admitting: Emergency Medicine

## 2013-09-04 ENCOUNTER — Encounter (HOSPITAL_COMMUNITY): Payer: Self-pay | Admitting: Emergency Medicine

## 2013-09-04 DIAGNOSIS — I8390 Asymptomatic varicose veins of unspecified lower extremity: Secondary | ICD-10-CM | POA: Insufficient documentation

## 2013-09-04 DIAGNOSIS — I10 Essential (primary) hypertension: Secondary | ICD-10-CM | POA: Insufficient documentation

## 2013-09-04 DIAGNOSIS — Z862 Personal history of diseases of the blood and blood-forming organs and certain disorders involving the immune mechanism: Secondary | ICD-10-CM | POA: Insufficient documentation

## 2013-09-04 DIAGNOSIS — Z79899 Other long term (current) drug therapy: Secondary | ICD-10-CM | POA: Insufficient documentation

## 2013-09-04 DIAGNOSIS — Z792 Long term (current) use of antibiotics: Secondary | ICD-10-CM | POA: Insufficient documentation

## 2013-09-04 DIAGNOSIS — M79609 Pain in unspecified limb: Secondary | ICD-10-CM

## 2013-09-04 DIAGNOSIS — E669 Obesity, unspecified: Secondary | ICD-10-CM | POA: Insufficient documentation

## 2013-09-04 DIAGNOSIS — Z791 Long term (current) use of non-steroidal anti-inflammatories (NSAID): Secondary | ICD-10-CM | POA: Insufficient documentation

## 2013-09-04 NOTE — ED Provider Notes (Signed)
CSN: [id number]     Arrival date & time 09/04/13  0848 History  This chart was scribed for Cleatrice Burke, PA, working with Wandra Arthurs, MD, by Elby Beck ED Scribe. This patient was seen in room TR07C/TR07C and the patient's care was started at 9:16 AM.   Chief Complaint  Patient presents with  . Leg Pain    The history is provided by the patient. No language interpreter was used.    HPI Comments: Christine Oneal is a 43 y.o. female with a history of HTN who presents to the Emergency Department complaining of intermittent, sharp right knee pain that radiates down her right leg to her right foot over the 5 days. She states that the pain has been worsening. She states that she also has a constant dull pain in her right calf/right lateral knee, where she also states that she believes there is a "knot". She states that there is nothing that worsens her pain. She states that she has taken Ibuprofen with some relief. She states that she has been able to ambulate normally with this pain. She denies any recent long trips or recent surgeries. She states that she is not on birth control. She denies any history of DVT or PE. She denies fever, chills, chest pain or SOB.    Past Medical History  Diagnosis Date  . Hypertension   . Obesity   . Anemia    Past Surgical History  Procedure Laterality Date  . Tubal ligation    . Tubal ligation     Family History  Problem Relation Age of Onset  . Hypertension Mother   . Cancer Mother   . Hypertension Father   . Stroke Father   . Diabetes Sister   . Hypertension Brother   . Cancer Maternal Grandmother    History  Substance Use Topics  . Smoking status: Never Smoker   . Smokeless tobacco: Not on file  . Alcohol Use: No   OB History   Grav Para Term Preterm Abortions TAB SAB Ect Mult Living                 Review of Systems  Constitutional: Negative for fever and chills.  Respiratory: Negative for shortness of breath.   Cardiovascular:  Negative for chest pain.  Musculoskeletal: Positive for arthralgias (right knee).  All other systems reviewed and are negative.   Allergies  Lisinopril-hydrochlorothiazide  Home Medications   Current Outpatient Rx  Name  Route  Sig  Dispense  Refill  . acetaminophen (TYLENOL) 325 MG tablet   Oral   Take 650 mg by mouth every 6 (six) hours as needed for pain.          Marland Kitchen EXPIRED: albuterol (PROVENTIL HFA;VENTOLIN HFA) 108 (90 BASE) MCG/ACT inhaler   Inhalation   Inhale 2 puffs into the lungs every 6 (six) hours as needed for wheezing.   1 Inhaler   2   . amLODipine (NORVASC) 10 MG tablet   Oral   Take 1 tablet (10 mg total) by mouth daily.   90 tablet   0   . cephALEXin (KEFLEX) 500 MG capsule   Oral   Take 1 capsule (500 mg total) by mouth 4 (four) times daily.   28 capsule   0   . HYDROcodone-acetaminophen (NORCO/VICODIN) 5-325 MG per tablet   Oral   Take 1-2 tablets by mouth every 6 (six) hours as needed for pain.   20 tablet   0   .  ibuprofen (ADVIL,MOTRIN) 800 MG tablet   Oral   Take 1 tablet (800 mg total) by mouth 3 (three) times daily. Prn for menstrual cramps   40 tablet   1   . loperamide (IMODIUM A-D) 2 MG tablet      2 now and one hourly prn diarrhea.  Max 8 tabs in 24 hours   30 tablet   0   . ondansetron (ZOFRAN-ODT) 8 MG disintegrating tablet   Oral   Take 1 tablet (8 mg total) by mouth every 8 (eight) hours as needed for nausea.   30 tablet   0   . promethazine (PHENERGAN) 25 MG tablet   Oral   Take 1 tablet (25 mg total) by mouth every 6 (six) hours as needed for nausea.   12 tablet   0    Triage Vitals: BP 139/92  Pulse 106  Temp(Src) 98.5 F (36.9 C) (Oral)  Resp 18  Ht 5\' 3"  (1.6 m)  Wt 290 lb (131.543 kg)  BMI 51.38 kg/m2  SpO2 97%  LMP 07/28/2013  Physical Exam  Nursing note and vitals reviewed. Constitutional: She is oriented to person, place, and time. She appears well-developed and well-nourished. No distress.   HENT:  Head: Normocephalic and atraumatic.  Right Ear: External ear normal.  Left Ear: External ear normal.  Nose: Nose normal.  Mouth/Throat: Oropharynx is clear and moist.  Eyes: Conjunctivae are normal.  Neck: Normal range of motion.  Cardiovascular: Normal rate, regular rhythm, normal heart sounds, intact distal pulses and normal pulses.   Pulses:      Posterior tibial pulses are 2+ on the right side, and 2+ on the left side.  Pulmonary/Chest: Effort normal and breath sounds normal. No stridor. No respiratory distress. She has no wheezes. She has no rales.  Abdominal: Soft. She exhibits no distension.  Musculoskeletal: Normal range of motion. She exhibits tenderness.  4 cm area of erythema on her posterior right knee, without fluctuance or induration. Tender to palpation,. Tenderness to posterior calf. No tenderness to anterior lower legs bilaterally.  Neurological: She is alert and oriented to person, place, and time. She has normal strength.  Skin: Skin is warm and dry. She is not diaphoretic. No erythema.  Psychiatric: She has a normal mood and affect. Her behavior is normal.    ED Course  Procedures (including critical care time)  DIAGNOSTIC STUDIES: Oxygen Saturation is 97% on RA, normal by my interpretation.    COORDINATION OF CARE: 9:23 AM- Pt advised of plan for treatment and pt agrees.  Labs Review Labs Reviewed - No data to display Imaging Review No results found.  EKG Interpretation   None       MDM   Final diagnoses:  Varicose veins    Pt presents to ED for right leg pain without injury. US showed thrombosed varicose veins over area where patient has pain. Area is large and will require repeat imaging in the future. I discussed this with patient who expresses understanding. She will follow up with her PCP. I discussed with the patient that this puts her at higher risk for DVT and discussed reasons to return to the emergency department immediately.  NSAIDs for pain. Discussed case with Dr. Darl Householder who agrees with plan. Vital signs stable for discharge. Patient / Family / Caregiver informed of clinical course, understand medical decision-making process, and agree with plan.    I personally performed the services described in this documentation, which was scribed in my presence. The  recorded information has been reviewed and is accurate.    Elwyn Lade, PA-C 09/04/13 1120

## 2013-09-04 NOTE — ED Notes (Signed)
Patient states started having R leg pain from knee to foot Friday of last week.  Patient states there is a small knot on leg below and to the right of her knee cap.   Patient states "it feels like a shooting pain sometimes".  Patient denies injury.

## 2013-09-04 NOTE — Discharge Instructions (Signed)
Please follow up with your PCP. You will need repeat imaging of the leg in the future. Please return for worsening swelling, redness, or pain in your leg as well as shortness of breath. Please take NSAIDs such as naproxen or ibuprofen for your pain.  Varicose Veins Varicose veins are veins that have become enlarged and twisted. CAUSES This condition is the result of valves in the veins not working properly. Valves in the veins help return blood from the leg to the heart. If these valves are damaged, blood flows backwards and backs up into the veins in the leg near the skin. This causes the veins to become larger. People who are on their feet a lot, who are pregnant, or who are overweight are more likely to develop varicose veins. SYMPTOMS   Bulging, twisted-appearing, bluish veins, most commonly found on the legs.  Leg pain or a feeling of heaviness. These symptoms may be worse at the end of the day.  Leg swelling.  Skin color changes. DIAGNOSIS  Varicose veins can usually be diagnosed with an exam of your legs by your caregiver. He or she may recommend an ultrasound of your leg veins. TREATMENT  Most varicose veins can be treated at home.However, other treatments are available for people who have persistent symptoms or who want to treat the cosmetic appearance of the varicose veins. These include:  Laser treatment of very small varicose veins.  Medicine that is shot (injected) into the vein. This medicine hardens the walls of the vein and closes off the vein. This treatment is called sclerotherapy. Afterwards, you may need to wear clothing or bandages that apply pressure.  Surgery. HOME CARE INSTRUCTIONS   Do not stand or sit in one position for long periods of time. Do not sit with your legs crossed. Rest with your legs raised during the day.  Wear elastic stockings or support hose. Do not wear other tight, encircling garments around the legs, pelvis, or waist.  Walk as much as  possible to increase blood flow.  Raise the foot of your bed at night with 2-inch blocks.  If you get a cut in the skin over the vein and the vein bleeds, lie down with your leg raised and press on it with a clean cloth until the bleeding stops. Then place a bandage (dressing) on the cut. See your caregiver if it continues to bleed or needs stitches. SEEK MEDICAL CARE IF:   The skin around your ankle starts to break down.  You have pain, redness, tenderness, or hard swelling developing in your leg over a vein.  You are uncomfortable due to leg pain. Document Released: 04/07/2005 Document Revised: 09/20/2011 Document Reviewed: 08/24/2010 [facility] Patient Information 2014 [place].  Compression Stockings Compression stockings are elastic stockings that "compress" your legs. This helps to increase blood flow, decrease swelling, and reduces the chance of getting blood clots in your lower legs. Compression stockings are used:  After surgery.  If you have a history of poor circulation.  If you are prone to blood clots.  If you have varicose veins.  If you sit or are bedridden for long periods of time. WEARING COMPRESSION STOCKINGS  Your compression stockings should be worn as instructed by your caregiver.  Wearing the correct stocking size is important. Your caregiver can help measure and fit you to the correct size.  When wearing your stockings, do not allow the stockings to bunch up. This is especially important around your toes or behind your knees.  Keep the stockings as smooth as possible.  Do not roll the stockings downward and leave them rolled down. This can form a restrictive band around your legs and can decrease blood flow.  The stockings should be removed once a day for 1 hour or as instructed by your caregiver. When the stockings are taken off, inspect your legs and feet. Look for:  Open sores.  Red spots.  Puffy areas (swelling).  Anything that does not seem  normal. IMPORTANT INFORMATION ABOUT COMPRESSION STOCKINGS  The compression stockings should be clean, dry, and in good condition before you put them on.  Do not put lotion on your legs or feet. This makes it harder to put the stockings on.  Change your stockings immediately if they become wet or soiled.  Do not wear stockings that are ripped or torn.  You may hand-wash or put your stockings in the washing machine. Use cold or warm water with mild detergent. Do not bleach your stockings. They may be air-dried or dried in the dryer on low heat.  If you have pain or have a feeling of "pins and needles" in your feet or legs, you may be wearing stockings that are too tight. Call your caregiver right away. SEEK IMMEDIATE MEDICAL CARE IF:   You have numbness or tingling in your lower legs that does not get better quickly after the stockings are removed.  Your toes or feet become cold and blue.  You develop open sores or have red spots on your legs that do not go away. MAKE SURE YOU:   Understand these instructions.  Will watch your condition.  Will get help right away if you are not doing well or get worse. Document Released: 04/25/2009 Document Revised: 09/20/2011 Document Reviewed: 04/25/2009 [facility] Patient Information 2014 [place], [place].

## 2013-09-04 NOTE — Progress Notes (Signed)
VASCULAR LAB PRELIMINARY  PRELIMINARY  PRELIMINARY  PRELIMINARY  Right lower extremity venous duplex completed.    Preliminary report:  No evidence of deep vein thrombosis involving the right lower extremity. There is no evidence of thrombosis of the greater saphenous vein. 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   Rozell Kettlewell, RVS 09/04/2013, 10:42 AM

## 2013-09-05 NOTE — ED Provider Notes (Signed)
Medical screening examination/treatment/procedure(s) were performed by non-physician practitioner and as supervising physician I was immediately available for consultation/collaboration.  EKG Interpretation   None         Wandra Arthurs, MD 09/05/13 1124

## 2013-10-08 ENCOUNTER — Ambulatory Visit (INDEPENDENT_AMBULATORY_CARE_PROVIDER_SITE_OTHER): Payer: BC Managed Care – PPO | Admitting: Family Medicine

## 2013-10-08 VITALS — BP 122/74 | HR 107 | Temp 99.1°F | Resp 18 | Ht 64.0 in | Wt 296.2 lb

## 2013-10-08 DIAGNOSIS — D649 Anemia, unspecified: Secondary | ICD-10-CM

## 2013-10-08 DIAGNOSIS — IMO0001 Reserved for inherently not codable concepts without codable children: Secondary | ICD-10-CM

## 2013-10-08 DIAGNOSIS — R509 Fever, unspecified: Secondary | ICD-10-CM

## 2013-10-08 DIAGNOSIS — M791 Myalgia, unspecified site: Secondary | ICD-10-CM

## 2013-10-08 DIAGNOSIS — R05 Cough: Secondary | ICD-10-CM

## 2013-10-08 DIAGNOSIS — R197 Diarrhea, unspecified: Secondary | ICD-10-CM

## 2013-10-08 DIAGNOSIS — R059 Cough, unspecified: Secondary | ICD-10-CM

## 2013-10-08 LAB — POCT CBC
Granulocyte percent: 75.5 %G (ref 37–80)
HCT, POC: 35.3 % — AB (ref 37.7–47.9)
Hemoglobin: 11.1 g/dL — AB (ref 12.2–16.2)
Lymph, poc: 1.5 (ref 0.6–3.4)
MCH, POC: 29.3 pg (ref 27–31.2)
MCHC: 31.4 g/dL — AB (ref 31.8–35.4)
MCV: 93.2 fL (ref 80–97)
MID (cbc): 0.6 (ref 0–0.9)
MPV: 7.5 fL (ref 0–99.8)
POC Granulocyte: 6.5 (ref 2–6.9)
POC LYMPH PERCENT: 17.1 %L (ref 10–50)
POC MID %: 7.4 %M (ref 0–12)
Platelet Count, POC: 439 10*3/uL — AB (ref 142–424)
RBC: 3.79 M/uL — AB (ref 4.04–5.48)
RDW, POC: 18.1 %
WBC: 8.6 10*3/uL (ref 4.6–10.2)

## 2013-10-08 MED ORDER — AZITHROMYCIN 250 MG PO TABS: ORAL_TABLET | ORAL | Status: DC

## 2013-10-08 NOTE — Patient Instructions (Signed)
Bronchitis Bronchitis is inflammation of the airways that extend from the windpipe into the lungs (bronchi). The inflammation often causes mucus to develop, which leads to a cough. If the inflammation becomes severe, it may cause shortness of breath. CAUSES  Bronchitis may be caused by:   Viral infections.   Bacteria.   Cigarette smoke.   Allergens, pollutants, and other irritants.  SIGNS AND SYMPTOMS  The most common symptom of bronchitis is a frequent cough that produces mucus. Other symptoms include:  Fever.   Body aches.   Chest congestion.   Chills.   Shortness of breath.   Sore throat.  DIAGNOSIS  Bronchitis is usually diagnosed through a medical history and physical exam. Tests, such as chest X-rays, are sometimes done to rule out other conditions.  TREATMENT  You may need to avoid contact with whatever caused the problem (smoking, for example). Medicines are sometimes needed. These may include:  Antibiotics. These may be prescribed if the condition is caused by bacteria.  Cough suppressants. These may be prescribed for relief of cough symptoms.   Inhaled medicines. These may be prescribed to help open your airways and make it easier for you to breathe.   Steroid medicines. These may be prescribed for those with recurrent (chronic) bronchitis. HOME CARE INSTRUCTIONS  Get plenty of rest.   Drink enough fluids to keep your urine clear or pale yellow (unless you have a medical condition that requires fluid restriction). Increasing fluids may help thin your secretions and will prevent dehydration.   Only take over-the-counter or prescription medicines as directed by your health care provider.  Only take antibiotics as directed. Make sure you finish them even if you start to feel better.  Avoid secondhand smoke, irritating chemicals, and strong fumes. These will make bronchitis worse. If you are a smoker, quit smoking. Consider using nicotine gum or  skin patches to help control withdrawal symptoms. Quitting smoking will help your lungs heal faster.   Put a cool-mist humidifier in your bedroom at night to moisten the air. This may help loosen mucus. Change the water in the humidifier daily. You can also run the hot water in your shower and sit in the bathroom with the door closed for 5 10 minutes.   Follow up with your health care provider as directed.   Wash your hands frequently to avoid catching bronchitis again or spreading an infection to others.  SEEK MEDICAL CARE IF: Your symptoms do not improve after 1 week of treatment.  SEEK IMMEDIATE MEDICAL CARE IF:  Your fever increases.  You have chills.   You have chest pain.   You have worsening shortness of breath.   You have bloody sputum.  You faint.  You have lightheadedness.  You have a severe headache.   You vomit repeatedly. MAKE SURE YOU:   Understand these instructions.  Will watch your condition.  Will get help right away if you are not doing well or get worse. Document Released: 06/28/2005 Document Revised: 04/18/2013 Document Reviewed: 02/20/2013 [facility] Patient Information 2014 [place]. Anemia, Nonspecific Anemia is a condition in which the concentration of red blood cells or hemoglobin in the blood is below normal. Hemoglobin is a substance in red blood cells that carries oxygen to the tissues of the body. Anemia results in not enough oxygen reaching these tissues.  CAUSES  Common causes of anemia include:   Excessive bleeding. Bleeding may be internal or external. This includes excessive bleeding from periods (in women) or from the  intestine.   Poor nutrition.   Chronic kidney, thyroid, and liver disease.  Bone marrow disorders that decrease red blood cell production.  Cancer and treatments for cancer.  HIV, AIDS, and their treatments.  Spleen problems that increase red blood cell destruction.  Blood disorders.  Excess  destruction of red blood cells due to infection, medicines, and autoimmune disorders. SIGNS AND SYMPTOMS   Minor weakness.   Dizziness.   Headache.  Palpitations.   Shortness of breath, especially with exercise.   Paleness.  Cold sensitivity.  Indigestion.  Nausea.  Difficulty sleeping.  Difficulty concentrating. Symptoms may occur suddenly or they may develop slowly.  DIAGNOSIS  Additional blood tests are often needed. These help your health care provider determine the best treatment. Your health care provider will check your stool for blood and look for other causes of blood loss.  TREATMENT  Treatment varies depending on the cause of the anemia. Treatment can include:   Supplements of iron, vitamin Y19, or folic acid.   Hormone medicines.   A blood transfusion. This may be needed if blood loss is severe.   Hospitalization. This may be needed if there is significant continual blood loss.   Dietary changes.  Spleen removal. HOME CARE INSTRUCTIONS Keep all follow-up appointments. It often takes many weeks to correct anemia, and having your health care provider check on your condition and your response to treatment is very important. SEEK IMMEDIATE MEDICAL CARE IF:   You develop extreme weakness, shortness of breath, or chest pain.   You become dizzy or have trouble concentrating.  You develop heavy vaginal bleeding.   You develop a rash.   You have bloody or black, tarry stools.   You faint.   You vomit up blood.   You vomit repeatedly.   You have abdominal pain.  You have a fever or persistent symptoms for more than 2 3 days.   You have a fever and your symptoms suddenly get worse.   You are dehydrated.  MAKE SURE YOU:  Understand these instructions.  Will watch your condition.  Will get help right away if you are not doing well or get worse. Document Released: 08/05/2004 Document Revised: 02/28/2013 Document Reviewed:  12/22/2012 [facility] Patient Information 2014 [place].

## 2013-10-08 NOTE — Progress Notes (Signed)
Subjective:   This chart was scribed for Christine Haber, MD by Forrestine Him, [facility] Scribe. This patient was seen in room 4 and the patient's care was started 7:09 PM.    Patient ID: Christine Oneal, female    DOB: 07-04-1971, 43 y.o.   MRN: [id number]  HPI  HPI Comments: Christine Oneal is a 43 y.o. female who presents to [facility] complaining of a sore throat x 1 day that is progressively worsening. She also reports a fever of 101.6 this morning, generalized body aches, mild nausea, cough, and mild diarrhea. She has tried OTC Ibuprofen for her fever with some relief. At this time she denies any chills or rash. Her PMHx includes HTN. No other concerns this visit.  Pt plans to follow up with Dr. Wynetta Emery for a current dental carie.   Pt currently works at [employer] as a Quarry manager.  Past Medical History  Diagnosis Date   Hypertension    Obesity    Anemia     History   Social History   Marital Status: Legally Separated    Spouse Name: N/A    Number of Children: N/A   Years of Education: N/A   Occupational History   Not on file.   Social History Main Topics   Smoking status: Never Smoker    Smokeless tobacco: Not on file   Alcohol Use: No   Drug Use: No   Sexual Activity: Not on file   Other Topics Concern   Not on file   Social History Narrative   No narrative on file    Review of Systems  Constitutional: Positive for fever. Negative for chills.  HENT: Negative for congestion.   Eyes: Negative for redness.  Respiratory: Positive for cough.   Musculoskeletal: Positive for myalgias.  Skin: Negative for rash.  Psychiatric/Behavioral: Negative for confusion.       Objective:   Physical Exam  Nursing note and vitals reviewed. Constitutional: She is oriented to person, place, and time. She appears well-developed and well-nourished.  HENT:  Head: Normocephalic and atraumatic.  Eyes: EOM are normal.  Neck:  Normal range of motion.  Cardiovascular: Normal rate and regular rhythm.  Exam reveals no gallop and no friction rub.   No murmur heard. Pulmonary/Chest: Effort normal.  Musculoskeletal: Normal range of motion.  Neurological: She is alert and oriented to person, place, and time.  Skin: Skin is warm and dry.  Psychiatric: She has a normal mood and affect. Her behavior is normal.   Results for orders placed in visit on 10/08/13  POCT CBC      Result Value Ref Range   WBC 8.6  4.6 - 10.2 K/uL   Lymph, poc 1.5  0.6 - 3.4   POC LYMPH PERCENT 17.1  10 - 50 %L   MID (cbc) 0.6  0 - 0.9   POC MID % 7.4  0 - 12 %M   POC Granulocyte 6.5  2 - 6.9   Granulocyte percent 75.5  37 - 80 %G   RBC 3.79 (*) 4.04 - 5.48 M/uL   Hemoglobin 11.1 (*) 12.2 - 16.2 g/dL   HCT, POC 35.3 (*) 37.7 - 47.9 %   MCV 93.2  80 - 97 fL   MCH, POC 29.3  27 - 31.2 pg   MCHC 31.4 (*) 31.8 - 35.4 g/dL   RDW, POC 18.1     Platelet Count, POC 439 (*) 142 -  424 K/uL   MPV 7.5  0 - 99.8 fL      Assessment & Plan:   Fever, unspecified - Plan: POCT CBC  Diarrhea - Plan: POCT CBC  Myalgia - Plan: POCT CBC  Cough  Anemia  Signed, Christine Haber, MD   I personally performed the services described in this documentation, which was scribed in my presence. The recorded information has been reviewed and is accurate.

## 2013-10-09 ENCOUNTER — Other Ambulatory Visit: Payer: Self-pay | Admitting: Family Medicine

## 2013-10-09 ENCOUNTER — Other Ambulatory Visit: Payer: Self-pay | Admitting: Physician Assistant

## 2013-10-09 MED ORDER — ALBUTEROL SULFATE HFA 108 (90 BASE) MCG/ACT IN AERS: 2.0000 | INHALATION_SPRAY | Freq: Four times a day (QID) | RESPIRATORY_TRACT | Status: DC | PRN

## 2013-10-09 NOTE — Telephone Encounter (Signed)
Dr L, it looks like we have Rxd albuterol inh prev for pt when she had a cough w/brochospasms. Do you want to Rx for pt now?

## 2013-10-17 ENCOUNTER — Telehealth: Payer: Self-pay | Admitting: Hematology & Oncology

## 2013-10-17 NOTE — Telephone Encounter (Signed)
Spoke w NEW PATIENT today to remind them of their appointment with Dr. Ennever. Also, advised them to bring all meds and insurance information. ° °

## 2013-10-18 ENCOUNTER — Other Ambulatory Visit (HOSPITAL_BASED_OUTPATIENT_CLINIC_OR_DEPARTMENT_OTHER): Payer: BC Managed Care – PPO | Admitting: Lab

## 2013-10-18 ENCOUNTER — Ambulatory Visit (HOSPITAL_BASED_OUTPATIENT_CLINIC_OR_DEPARTMENT_OTHER): Payer: BC Managed Care – PPO | Admitting: Hematology & Oncology

## 2013-10-18 ENCOUNTER — Encounter: Payer: Self-pay | Admitting: Hematology & Oncology

## 2013-10-18 ENCOUNTER — Ambulatory Visit: Payer: BC Managed Care – PPO

## 2013-10-18 VITALS — BP 156/87 | HR 92 | Temp 98.4°F | Resp 14 | Ht 62.0 in | Wt 291.0 lb

## 2013-10-18 DIAGNOSIS — D72829 Elevated white blood cell count, unspecified: Secondary | ICD-10-CM

## 2013-10-18 DIAGNOSIS — D72819 Decreased white blood cell count, unspecified: Secondary | ICD-10-CM

## 2013-10-18 DIAGNOSIS — Z1239 Encounter for other screening for malignant neoplasm of breast: Secondary | ICD-10-CM

## 2013-10-18 DIAGNOSIS — D649 Anemia, unspecified: Secondary | ICD-10-CM

## 2013-10-18 HISTORY — DX: Elevated white blood cell count, unspecified: D72.829

## 2013-10-18 LAB — CBC WITH DIFFERENTIAL (CANCER CENTER ONLY)
BASO#: 0 10*3/uL (ref 0.0–0.2)
BASO%: 0.2 % (ref 0.0–2.0)
EOS ABS: 0.1 10*3/uL (ref 0.0–0.5)
EOS%: 0.6 % (ref 0.0–7.0)
HCT: 33.9 % — ABNORMAL LOW (ref 34.8–46.6)
HGB: 11.5 g/dL — ABNORMAL LOW (ref 11.6–15.9)
LYMPH#: 2.6 10*3/uL (ref 0.9–3.3)
LYMPH%: 27.9 % (ref 14.0–48.0)
MCH: 30.5 pg (ref 26.0–34.0)
MCHC: 33.9 g/dL (ref 32.0–36.0)
MCV: 90 fL (ref 81–101)
MONO#: 0.6 10*3/uL (ref 0.1–0.9)
MONO%: 6.8 % (ref 0.0–13.0)
NEUT%: 64.5 % (ref 39.6–80.0)
NEUTROS ABS: 6 10*3/uL (ref 1.5–6.5)
PLATELETS: 387 10*3/uL (ref 145–400)
RBC: 3.77 10*6/uL (ref 3.70–5.32)
RDW: 15.3 % (ref 11.1–15.7)
WBC: 9.3 10*3/uL (ref 3.9–10.0)

## 2013-10-18 LAB — CHCC SATELLITE - SMEAR

## 2013-10-18 NOTE — Progress Notes (Signed)
Referral MD  Reason for Referral: Leukocytosis   Chief Complaint  Patient presents with  . NEW PATIENT    "elevated WBC"  : My white cell count is high  HPI: Christine Oneal is a very charming 43 year old Afro-American female. She actually has [place] blood in her. She says that her maternal grandmother was full [place].  She has been noted to have an increase in white cells over the past couple years or so. Lab work at that I have has it shown her white cell count to be elevated. Her white cell count back in 2013 was up to 16,000. She has had a little bit of anemia. She does have heavy monthly cycles. She has fibroids.  Her white cell count has trended downward low blood. Her most recent value that I have from February and was a white cell count 11.2. Hemoglobin 10.5. Platelet count was 410,000.Marland Kitchen MCV was 90. She recently has been started on some oral iron. She has not yet started the iron.  She has had a problem with infections. She's had no rashes. She has been under a lot of stress. She has recently got through a divorce. She has 2 teenagers. She is working. She enjoys work.  She's had no weight loss or weight gain. There's been no swallowing difficulties. She's had no palpable lymph glands.  She has not had a mammogram. There is no history of breast cancer in the family.  She is coming referred to the [facility]   Past Medical History  Diagnosis Date  . Hypertension   . Obesity   . Anemia   :  Past Surgical History  Procedure Laterality Date  . Tubal ligation    . Tubal ligation    :  Current outpatient prescriptions:albuterol (PROVENTIL HFA;VENTOLIN HFA) 108 (90 BASE) MCG/ACT inhaler, Inhale 2 puffs into the lungs every 6 (six) hours as needed for wheezing., Disp: 1 Inhaler, Rfl: 2;  amLODipine (NORVASC) 10 MG tablet, Take 1 tablet (10 mg total) by mouth daily., Disp: 90 tablet, Rfl: 0 ibuprofen (ADVIL,MOTRIN) 800 MG tablet, Take 1 tablet (800 mg  total) by mouth 3 (three) times daily. Prn for menstrual cramps, Disp: 40 tablet, Rfl: 1:  :  Allergies  Allergen Reactions  . Lisinopril-Hydrochlorothiazide Anaphylaxis  :  Family History  Problem Relation Age of Onset  . Hypertension Mother   . Cancer Mother   . Hypertension Father   . Stroke Father   . Diabetes Sister   . Hypertension Brother   . Cancer Maternal Grandmother   :  History   Social History  . Marital Status: Legally Separated    Spouse Name: N/A    Number of Children: N/A  . Years of Education: N/A   Occupational History  . Not on file.   Social History Main Topics  . Smoking status: Never Smoker   . Smokeless tobacco: Never Used     Comment: never used tobacco  . Alcohol Use: No  . Drug Use: No  . Sexual Activity: Not on file   Other Topics Concern  . Not on file   Social History Narrative  . No narrative on file  :  Pertinent items are noted in HPI.  Exam: @IPVITALS @  morbidly obese after American female in no obvious distress. Vital signs are temperature of 98.4. Pulse 92. Blood pressure 156/87. Weight is 291 pounds. Head exam showed no ocular or oral lesions. There are no palpable cervical or supra-clavicular lymph nodes.  Lungs are clear bilaterally. Cardiac exam regular in rhythm with no murmurs rubs or bruits. Abdomen is soft. She is morbidly obese. She has good bowel sounds. There is no fluid wave. There is a palpable hepatosplenomegaly. Back exam no tenderness over the spine ribs or hips. Extremities shows no clubbing cyanosis or edema. There may be some slight stasis dermatitis changes in the lower legs. Skin exam shows no rashes, ecchymosis or petechia. Neurological exam shows no focal neurological deficits.    Recent Labs  10/18/13 1543  WBC 9.3  HGB 11.5*  HCT 33.9*  PLT 387   No results found for this basename: NA, K, CL, CO2, GLUCOSE, BUN, CREATININE, CALCIUM,  in the last 72 hours  Blood smear review: Normochromic  normocytic probably she of red blood cells. There maybe a couple target cells. I see no schistocytes. There is no nucleated red cells. There are no teardrop cells. I see no rouleau formation. White cells appear normal in morphology maturation. There are no hypersegmented polys. I see no immature myeloid forms. She has no atypical lymphocytes. Platelets are adequate number size.  Pathology: None     Assessment and Plan: Christine Oneal is a very charming 43 year old Afro-American female. She has some mild leukocytosis. This is transient. Her white cell count is normal today. She is slightly anemic. I suspect that she probably is iron deficient. She may have thalassemia. Is no history of sickle cell in the family.  Am not sure why she would have been white cell elevation previously. It may of been stress related.  Her blood smear and is a quite reassuring. Her physical exam also is reassuring.  She does need to have a mammogram. She is 43 years old. She's not yet had one. I will try them he was set up for her.  I would like to get her back to see [place] in another 3-4 months. I think if all looks good at that point,, that we can likely let her go from the practice.  I spent a good 45 minutes or so with her. I did give her a prayer blanket. She is very appreciative of this.

## 2013-10-19 ENCOUNTER — Telehealth: Payer: Self-pay | Admitting: Hematology & Oncology

## 2013-10-19 NOTE — Telephone Encounter (Signed)
Pt aware [facility] and [facility]-6 MD appointments

## 2013-10-26 ENCOUNTER — Emergency Department (HOSPITAL_COMMUNITY)
Admission: EM | Admit: 2013-10-26 | Discharge: 2013-10-26 | Disposition: A | Discharge status: Home / Self Care | Payer: BC Managed Care – PPO | Attending: Emergency Medicine | Admitting: Emergency Medicine

## 2013-10-26 DIAGNOSIS — Z3202 Encounter for pregnancy test, result negative: Secondary | ICD-10-CM | POA: Insufficient documentation

## 2013-10-26 DIAGNOSIS — Z791 Long term (current) use of non-steroidal anti-inflammatories (NSAID): Secondary | ICD-10-CM | POA: Insufficient documentation

## 2013-10-26 DIAGNOSIS — N920 Excessive and frequent menstruation with regular cycle: Secondary | ICD-10-CM

## 2013-10-26 DIAGNOSIS — D649 Anemia, unspecified: Secondary | ICD-10-CM | POA: Insufficient documentation

## 2013-10-26 DIAGNOSIS — I10 Essential (primary) hypertension: Secondary | ICD-10-CM | POA: Insufficient documentation

## 2013-10-26 DIAGNOSIS — E669 Obesity, unspecified: Secondary | ICD-10-CM | POA: Insufficient documentation

## 2013-10-26 DIAGNOSIS — Z79899 Other long term (current) drug therapy: Secondary | ICD-10-CM | POA: Insufficient documentation

## 2013-10-26 LAB — COMPREHENSIVE METABOLIC PANEL
ALK PHOS: 107 U/L (ref 39–117)
ALT: 15 U/L (ref 0–35)
AST: 14 U/L (ref 0–37)
Albumin: 3.1 g/dL — ABNORMAL LOW (ref 3.5–5.2)
BILIRUBIN TOTAL: 0.4 mg/dL (ref 0.3–1.2)
BUN: 10 mg/dL (ref 6–23)
CHLORIDE: 108 meq/L (ref 96–112)
CO2: 24 mEq/L (ref 19–32)
Calcium: 9.3 mg/dL (ref 8.4–10.5)
Creatinine, Ser: 0.52 mg/dL (ref 0.50–1.10)
GFR calc non Af Amer: >90 mL/min (ref >90)
GLUCOSE: 103 mg/dL — AB (ref 70–99)
POTASSIUM: 3.5 meq/L — AB (ref 3.7–5.3)
Sodium: 142 mEq/L (ref 137–147)
Total Protein: 7.1 g/dL (ref 6.0–8.3)

## 2013-10-26 LAB — CBC WITH DIFFERENTIAL/PLATELET
Basophils Absolute: 0 10*3/uL (ref 0.0–0.1)
Basophils Relative: 0 % (ref 0–1)
Eosinophils Absolute: 0.1 10*3/uL (ref 0.0–0.7)
Eosinophils Relative: 1 % (ref 0–5)
HCT: 30.8 % — ABNORMAL LOW (ref 36.0–46.0)
Hemoglobin: 10.2 g/dL — ABNORMAL LOW (ref 12.0–15.0)
LYMPHS ABS: 3 10*3/uL (ref 0.7–4.0)
Lymphocytes Relative: 31 % (ref 12–46)
MCH: 30.1 pg (ref 26.0–34.0)
MCHC: 33.1 g/dL (ref 30.0–36.0)
MCV: 90.9 fL (ref 78.0–100.0)
MONOS PCT: 6 % (ref 3–12)
Monocytes Absolute: 0.6 10*3/uL (ref 0.1–1.0)
NEUTROS ABS: 6 10*3/uL (ref 1.7–7.7)
NEUTROS PCT: 62 % (ref 43–77)
Platelets: 411 10*3/uL — ABNORMAL HIGH (ref 150–400)
RBC: 3.39 MIL/uL — AB (ref 3.87–5.11)
RDW: 16.4 % — ABNORMAL HIGH (ref 11.5–15.5)
WBC: 9.9 10*3/uL (ref 4.0–10.5)

## 2013-10-26 LAB — URINALYSIS, ROUTINE W REFLEX MICROSCOPIC
BILIRUBIN URINE: NEGATIVE
Glucose, UA: NEGATIVE mg/dL
Hgb urine dipstick: NEGATIVE
Ketones, ur: NEGATIVE mg/dL
Leukocytes, UA: NEGATIVE
NITRITE: NEGATIVE
Protein, ur: NEGATIVE mg/dL
Specific Gravity, Urine: 1.022 (ref 1.005–1.030)
UROBILINOGEN UA: 1 mg/dL (ref 0.0–1.0)
pH: 7 (ref 5.0–8.0)

## 2013-10-26 LAB — PREGNANCY, URINE: PREG TEST UR: NEGATIVE

## 2013-10-26 MED ORDER — MEDROXYPROGESTERONE ACETATE 10 MG PO TABS: 10.0000 mg | ORAL_TABLET | Freq: Every day | ORAL | Status: DC

## 2013-10-26 NOTE — ED Notes (Signed)
Pt c/o lower abdominal pain and heavy bleeding from her menstrual cycle. Pt states her cycle started a week early and has been very heavy for two weeks. Pt reports changing tampon every 15 minutes today. Pt denies shortness of breath or weakness. Reports some tiredness. Pt is A&Ox4, respirations equal and unlabored, skin warm and dry.

## 2013-10-26 NOTE — ED Notes (Signed)
Pt reports lower abdominal "pain" pressure since Jan 2015.  Currently menses began 10/14/13 and normally lasts 7 days.  This one is lasting longer.  After taking a nap today noted increased blood clots. Since 4pm has changed tampon 4 times.  Pain in lower abdomen is not described as new pain, rather menstrual cycle pressure in lower abdomen.  Reports her periods have been becoming more "messed up" since Jan 2015.  Appt with her physician not until 5/26.

## 2013-10-26 NOTE — ED Provider Notes (Signed)
CSN: [id number]     Arrival date & time 10/26/13  2018 History   First MD Initiated Contact with Patient 10/26/13 2304     Chief Complaint  Patient presents with  . Abdominal Pain  . Vaginal Bleeding     (Consider location/radiation/quality/duration/timing/severity/associated sxs/prior Treatment) HPI Patient is a 43 yo woman with a long history of menorrhagia and uterine fibroids. She is here with complaints of heavy vaginal bleeding for the past two weeks. She says she had to change tampons every 15-20 minutes today. Passing clots. No tissue, occasional midline lower abdominal cramping. Patient denies any symptoms of near syncope. She has not had chest pain or shortness of breath.  She is not taking any contraception hormone medications. She has new patient appointment with a gynecologist on May 25.    Past Medical History  Diagnosis Date  . Hypertension   . Obesity   . Anemia   . Leukocytosis, unspecified 10/18/2013   Past Surgical History  Procedure Laterality Date  . Tubal ligation    . Tubal ligation     Family History  Problem Relation Age of Onset  . Hypertension Mother   . Cancer Mother   . Hypertension Father   . Stroke Father   . Diabetes Sister   . Hypertension Brother   . Cancer Maternal Grandmother    History  Substance Use Topics  . Smoking status: Never Smoker   . Smokeless tobacco: Never Used     Comment: never used tobacco  . Alcohol Use: No   OB History   Grav Para Term Preterm Abortions TAB SAB Ect Mult Living                 Review of Systems Ten point review of symptoms performed and is negative with the exception of symptoms noted above.     Allergies  Lisinopril-hydrochlorothiazide  Home Medications   Prior to Admission medications   Medication Sig Start Date End Date Taking? Authorizing Provider  albuterol (PROVENTIL HFA;VENTOLIN HFA) 108 (90 BASE) MCG/ACT inhaler Inhale 2 puffs into the lungs every 6 (six) hours as needed for  wheezing. 10/09/13 10/09/14 Yes [name], MD  amLODipine (NORVASC) 10 MG tablet Take 1 tablet (10 mg total) by mouth daily. 08/21/13  Yes Gay Filler Copland, MD  ibuprofen (ADVIL,MOTRIN) 800 MG tablet Take 1 tablet (800 mg total) by mouth 3 (three) times daily. Prn for menstrual cramps 08/21/13  Yes Gay Filler Copland, MD   BP 145/81  Pulse 95  Temp(Src) 98.7 F (37.1 C) (Oral)  Resp 20  SpO2 98%  LMP 10/14/2013 Physical Exam Gen: well developed and well nourished appearing Head: NCAT Eyes: PERL, EOMI Nose: no epistaixis or rhinorrhea Mouth/throat: mucosa is moist and pink Neck: supple, no stridor Lungs: CTA B, no wheezing, rhonchi or rales CV: regular rate and rythm, good distal pulses.  Abd: soft, obese, notender, nondistended Back: no ttp, no cva ttp Skin: warm and dry Ext: no edema, normal to inspection Neuro: CN ii-xii grossly intact, no focal deficits Psyche; normal affect,  calm and cooperative.  ED Course  Procedures (including critical care time) Labs Review  Results for orders placed during the hospital encounter of 10/26/13 (from the past 24 hour(s))  CBC WITH DIFFERENTIAL     Status: Abnormal   Collection Time    10/26/13  8:57 PM      Result Value Ref Range   WBC 9.9  4.0 - 10.5 K/uL   RBC 3.39 (*) 3.87 -  5.11 MIL/uL   Hemoglobin 10.2 (*) 12.0 - 15.0 g/dL   HCT 30.8 (*) 36.0 - 46.0 %   MCV 90.9  78.0 - 100.0 fL   MCH 30.1  26.0 - 34.0 pg   MCHC 33.1  30.0 - 36.0 g/dL   RDW 16.4 (*) 11.5 - 15.5 %   Platelets 411 (*) 150 - 400 K/uL   Neutrophils Relative % 62  43 - 77 %   Neutro Abs 6.0  1.7 - 7.7 K/uL   Lymphocytes Relative 31  12 - 46 %   Lymphs Abs 3.0  0.7 - 4.0 K/uL   Monocytes Relative 6  3 - 12 %   Monocytes Absolute 0.6  0.1 - 1.0 K/uL   Eosinophils Relative 1  0 - 5 %   Eosinophils Absolute 0.1  0.0 - 0.7 K/uL   Basophils Relative 0  0 - 1 %   Basophils Absolute 0.0  0.0 - 0.1 K/uL  COMPREHENSIVE METABOLIC PANEL     Status: Abnormal    Collection Time    10/26/13  8:57 PM      Result Value Ref Range   Sodium 142  137 - 147 mEq/L   Potassium 3.5 (*) 3.7 - 5.3 mEq/L   Chloride 108  96 - 112 mEq/L   CO2 24  19 - 32 mEq/L   Glucose, Bld 103 (*) 70 - 99 mg/dL   BUN 10  6 - 23 mg/dL   Creatinine, Ser 0.52  0.50 - 1.10 mg/dL   Calcium 9.3  8.4 - 10.5 mg/dL   Total Protein 7.1  6.0 - 8.3 g/dL   Albumin 3.1 (*) 3.5 - 5.2 g/dL   AST 14  0 - 37 U/L   ALT 15  0 - 35 U/L   Alkaline Phosphatase 107  39 - 117 U/L   Total Bilirubin 0.4  0.3 - 1.2 mg/dL   GFR calc non Af Amer >90  >90 mL/min   GFR calc Af Amer >90  >90 mL/min  PREGNANCY, URINE     Status: None   Collection Time    10/26/13 10:32 PM      Result Value Ref Range   Preg Test, Ur NEGATIVE  NEGATIVE  URINALYSIS, ROUTINE W REFLEX MICROSCOPIC     Status: None   Collection Time    10/26/13 10:32 PM      Result Value Ref Range   Color, Urine YELLOW  YELLOW   APPearance CLEAR  CLEAR   Specific Gravity, Urine 1.022  1.005 - 1.030   pH 7.0  5.0 - 8.0   Glucose, UA NEGATIVE  NEGATIVE mg/dL   Hgb urine dipstick NEGATIVE  NEGATIVE   Bilirubin Urine NEGATIVE  NEGATIVE   Ketones, ur NEGATIVE  NEGATIVE mg/dL   Protein, ur NEGATIVE  NEGATIVE mg/dL   Urobilinogen, UA 1.0  0.0 - 1.0 mg/dL   Nitrite NEGATIVE  NEGATIVE   Leukocytes, UA NEGATIVE  NEGATIVE    MDM   Dysfunctional uterine bleeding and asymptomatic anemia. We will tx with progesterone 10mg  x 5d. Patient has scheduled new patient appt with GYN in 1 month.    Elyn Peers, MD 10/26/13 [phone]

## 2013-10-26 NOTE — Discharge Instructions (Signed)
Abnormal Uterine Bleeding Abnormal uterine bleeding can affect women at various stages in life, including teenagers, women in their reproductive years, pregnant women, and women who have reached menopause. Several kinds of uterine bleeding are considered abnormal, including:  Bleeding or spotting between periods.   Bleeding after sexual intercourse.   Bleeding that is heavier or more than normal.   Periods that last longer than usual.  Bleeding after menopause.  Many cases of abnormal uterine bleeding are minor and simple to treat, while others are more serious. Any type of abnormal bleeding should be evaluated by your health care provider. Treatment will depend on the cause of the bleeding. HOME CARE INSTRUCTIONS Monitor your condition for any changes. The following actions may help to alleviate any discomfort you are experiencing:  Avoid the use of tampons and douches as directed by your health care provider.  Change your pads frequently. You should get regular pelvic exams and Pap tests. Keep all follow-up appointments for diagnostic tests as directed by your health care provider.  SEEK MEDICAL CARE IF:   Your bleeding lasts more than 1 week.   You feel dizzy at times.  SEEK IMMEDIATE MEDICAL CARE IF:   You pass out.   You are changing pads every 15 to 30 minutes.   You have abdominal pain.  You have a fever.   You become sweaty or weak.   You are passing large blood clots from the vagina.   You start to feel nauseous and vomit. MAKE SURE YOU:   Understand these instructions.  Will watch your condition.  Will get help right away if you are not doing well or get worse. Document Released: 06/28/2005 Document Revised: 02/28/2013 Document Reviewed: 01/25/2013 [facility] Patient Information 2014 [place], [place].  Anemia, Nonspecific Anemia is a condition in which the concentration of red blood cells or hemoglobin in the blood is below normal. Hemoglobin is a  substance in red blood cells that carries oxygen to the tissues of the body. Anemia results in not enough oxygen reaching these tissues.  CAUSES  Common causes of anemia include:   Excessive bleeding. Bleeding may be internal or external. This includes excessive bleeding from periods (in women) or from the intestine.   Poor nutrition.   Chronic kidney, thyroid, and liver disease.  Bone marrow disorders that decrease red blood cell production.  Cancer and treatments for cancer.  HIV, AIDS, and their treatments.  Spleen problems that increase red blood cell destruction.  Blood disorders.  Excess destruction of red blood cells due to infection, medicines, and autoimmune disorders. SIGNS AND SYMPTOMS   Minor weakness.   Dizziness.   Headache.  Palpitations.   Shortness of breath, especially with exercise.   Paleness.  Cold sensitivity.  Indigestion.  Nausea.  Difficulty sleeping.  Difficulty concentrating. Symptoms may occur suddenly or they may develop slowly.  DIAGNOSIS  Additional blood tests are often needed. These help your health care provider determine the best treatment. Your health care provider will check your stool for blood and look for other causes of blood loss.  TREATMENT  Treatment varies depending on the cause of the anemia. Treatment can include:   Supplements of iron, vitamin B76, or folic acid.   Hormone medicines.   A blood transfusion. This may be needed if blood loss is severe.   Hospitalization. This may be needed if there is significant continual blood loss.   Dietary changes.  Spleen removal. HOME CARE INSTRUCTIONS Keep all follow-up appointments. It often takes many  weeks to correct anemia, and having your health care provider check on your condition and your response to treatment is very important. SEEK IMMEDIATE MEDICAL CARE IF:   You develop extreme weakness, shortness of breath, or chest pain.   You become dizzy  or have trouble concentrating.  You develop heavy vaginal bleeding.   You develop a rash.   You have bloody or black, tarry stools.   You faint.   You vomit up blood.   You vomit repeatedly.   You have abdominal pain.  You have a fever or persistent symptoms for more than 2 3 days.   You have a fever and your symptoms suddenly get worse.   You are dehydrated.  MAKE SURE YOU:  Understand these instructions.  Will watch your condition.  Will get help right away if you are not doing well or get worse. Document Released: 08/05/2004 Document Revised: 02/28/2013 Document Reviewed: 12/22/2012 [facility] Patient Information 2014 [place].

## 2013-11-14 ENCOUNTER — Ambulatory Visit: Payer: BC Managed Care – PPO

## 2013-12-04 ENCOUNTER — Encounter: Payer: Self-pay | Admitting: Gynecology

## 2013-12-04 ENCOUNTER — Ambulatory Visit: Payer: BC Managed Care – PPO | Admitting: Gynecology

## 2014-02-01 ENCOUNTER — Emergency Department (HOSPITAL_COMMUNITY)
Admission: EM | Admit: 2014-02-01 | Discharge: 2014-02-01 | Disposition: A | Discharge status: Home / Self Care | Payer: BC Managed Care – PPO | Source: Home / Self Care | Attending: Family Medicine | Admitting: Family Medicine

## 2014-02-01 ENCOUNTER — Encounter (HOSPITAL_COMMUNITY): Payer: Self-pay | Admitting: Emergency Medicine

## 2014-02-01 DIAGNOSIS — M722 Plantar fascial fibromatosis: Secondary | ICD-10-CM

## 2014-02-01 DIAGNOSIS — M5430 Sciatica, unspecified side: Secondary | ICD-10-CM

## 2014-02-01 DIAGNOSIS — M5442 Lumbago with sciatica, left side: Secondary | ICD-10-CM

## 2014-02-01 LAB — POCT URINALYSIS DIP (DEVICE)
Bilirubin Urine: NEGATIVE
Glucose, UA: NEGATIVE mg/dL
Ketones, ur: NEGATIVE mg/dL
LEUKOCYTES UA: NEGATIVE
NITRITE: NEGATIVE
PH: 7 (ref 5.0–8.0)
Protein, ur: NEGATIVE mg/dL
Specific Gravity, Urine: 1.02 (ref 1.005–1.030)
Urobilinogen, UA: 2 mg/dL — ABNORMAL HIGH (ref 0.0–1.0)

## 2014-02-01 LAB — POCT PREGNANCY, URINE: Preg Test, Ur: NEGATIVE

## 2014-02-01 MED ORDER — CYCLOBENZAPRINE HCL 5 MG PO TABS: 5.0000 mg | ORAL_TABLET | Freq: Every evening | ORAL | Status: DC | PRN

## 2014-02-01 MED ORDER — PREDNISONE 10 MG PO KIT: PACK | ORAL | Status: DC

## 2014-02-01 NOTE — ED Provider Notes (Signed)
Christine Oneal is a 43 y.o. female who presents to Urgent Care today for back pain and foot pain. 1) back pain: Present for one week without injury. Patient has bilateral low back pain. The pain radiates to left leg. No weakness or numbness bowel bladder dysfunction or difficulty walking. Patient has tried ibuprofen which helps some.  2) bilateral foot pain left worse than right. Symptoms present for one month. She's tried gel insoles which is not helped. Symptoms are worse with standing and walking. Symptoms are moderate   Past Medical History  Diagnosis Date  . Hypertension   . Obesity   . Anemia   . Leukocytosis, unspecified 10/18/2013   History  Substance Use Topics  . Smoking status: Never Smoker   . Smokeless tobacco: Never Used     Comment: never used tobacco  . Alcohol Use: No   ROS as above Medications: No current facility-administered medications for this encounter.   Current Outpatient Prescriptions  Medication Sig Dispense Refill  . albuterol (PROVENTIL HFA;VENTOLIN HFA) 108 (90 BASE) MCG/ACT inhaler Inhale 2 puffs into the lungs every 6 (six) hours as needed for wheezing.  1 Inhaler  2  . amLODipine (NORVASC) 10 MG tablet Take 1 tablet (10 mg total) by mouth daily.  90 tablet  0  . cyclobenzaprine (FLEXERIL) 5 MG tablet Take 1 tablet (5 mg total) by mouth at bedtime as needed for muscle spasms.  20 tablet  0  . medroxyPROGESTERone (PROVERA) 10 MG tablet Take 1 tablet (10 mg total) by mouth daily.  7 tablet  0  . PredniSONE 10 MG KIT 12 day dose pack po  1 kit  0    Exam:  BP 172/82  Pulse 97  Temp(Src) 98.5 F (36.9 C) (Oral)  Resp 20  Ht _0  (1.6 m)  Wt 280 lb (127.007 kg)  BMI 49.61 kg/m2  SpO2 98%  LMP 01/07/2014 Gen: Well NAD obese HEENT: EOMI,  MMM Lungs: Normal work of breathing. CTABL Heart: RRR no MRG Abd: NABS, Soft. Nondistended, Nontender Exts: Brisk capillary refill, warm and well perfused.  Back: Nontender to spinal midline. Tender  palpation bilateral lumbar paraspinal Negative straight leg raise test and Faber test bilaterally Reflexes are equal and lateral extremities Strength is equal bilateral extremities Normal gait Feet bilaterally show pes planus and moderate pronation. Tender palpation plantar medial calcaneus Normal foot motion capillary full pulses and sensation  Results for orders placed during the hospital encounter of 02/01/14 (from the past 24 hour(s))  POCT URINALYSIS DIP (DEVICE)     Status: Abnormal   Collection Time    02/01/14  3:02 PM      Result Value Ref Range   Glucose, UA NEGATIVE  NEGATIVE mg/dL   Bilirubin Urine NEGATIVE  NEGATIVE   Ketones, ur NEGATIVE  NEGATIVE mg/dL   Specific Gravity, Urine 1.020  1.005 - 1.030   Hgb urine dipstick TRACE (*) NEGATIVE   pH 7.0  5.0 - 8.0   Protein, ur NEGATIVE  NEGATIVE mg/dL   Urobilinogen, UA 2.0 (*) 0.0 - 1.0 mg/dL   Nitrite NEGATIVE  NEGATIVE   Leukocytes, UA NEGATIVE  NEGATIVE  POCT PREGNANCY, URINE     Status: None   Collection Time    02/01/14  3:08 PM      Result Value Ref Range   Preg Test, Ur NEGATIVE  NEGATIVE   No results found.  Assessment and Plan: 43 y.o. female with  1) lumbago with some sciatica symptoms: Plan  for prednisone dosepak. Flexeril as needed. Home exercise program, and physical therapy. 2) foot pain: Plantar fasciitis. Home exercise program, ice, followup with sports medicine  Discussed warning signs or symptoms. Please see discharge instructions. Patient expresses understanding.   This note was created using Systems analyst. Any transcription errors are unintended.    Gregor Hams, MD 02/01/14 [phone]

## 2014-02-01 NOTE — Discharge Instructions (Signed)
Thank you for coming in today. Attend physical therapy. They should call you.  Use a heating pad on the back.  Do the heel raise exercises and ice massage on the foot.  Take the prednisone and use flexeril as needed.  Follow up with Dr. Edilia Bo or Dr. Tamala Julian.  Come back as needed.    Back Pain, Adult Back pain is very common. The pain often gets better over time. The cause of back pain is usually not dangerous. Most people can learn to manage their back pain on their own.  HOME CARE   Stay active. Start with short walks on flat ground if you can. Try to walk farther each day.  Do not sit, drive, or stand in one place for more than 30 minutes. Do not stay in bed.  Do not avoid exercise or work. Activity can help your back heal faster.  Be careful when you bend or lift an object. Bend at your knees, keep the object close to you, and do not twist.  Sleep on a firm mattress. Lie on your side, and bend your knees. If you lie on your back, put a pillow under your knees.  Only take medicines as told by your doctor.  Put ice on the injured area.  Put ice in a plastic bag.  Place a towel between your skin and the bag.  Leave the ice on for 15-20 minutes, 03-04 times a day for the first 2 to 3 days. After that, you can switch between ice and heat packs.  Ask your doctor about back exercises or massage.  Avoid feeling anxious or stressed. Find good ways to deal with stress, such as exercise. GET HELP RIGHT AWAY IF:   Your pain does not go away with rest or medicine.  Your pain does not go away in 1 week.  You have new problems.  You do not feel well.  The pain spreads into your legs.  You cannot control when you poop (bowel movement) or pee (urinate).  Your arms or legs feel weak or lose feeling (numbness).  You feel sick to your stomach (nauseous) or throw up (vomit).  You have belly (abdominal) pain.  You feel like you may pass out (faint). MAKE SURE YOU:    Understand these instructions.  Will watch your condition.  Will get help right away if you are not doing well or get worse. Document Released: 12/15/2007 Document Revised: 09/20/2011 Document Reviewed: 10/30/2013 [facility] Patient Information 2015 [place], [place]. This information is not intended to replace advice given to you by your health care provider. Make sure you discuss any questions you have with your health care provider.   Plantar Fasciitis Plantar fasciitis is a common condition that causes foot pain. It is soreness (inflammation) of the band of tough fibrous tissue on the bottom of the foot that runs from the heel bone (calcaneus) to the ball of the foot. The cause of this soreness may be from excessive standing, poor fitting shoes, running on hard surfaces, being overweight, having an abnormal walk, or overuse (this is common in runners) of the painful foot or feet. It is also common in aerobic exercise dancers and ballet dancers. SYMPTOMS  Most people with plantar fasciitis complain of:  Severe pain in the morning on the bottom of their foot especially when taking the first steps out of bed. This pain recedes after a few minutes of walking.  Severe pain is experienced also during walking following a long period of  inactivity.  Pain is worse when walking barefoot or up stairs DIAGNOSIS   Your caregiver will diagnose this condition by examining and feeling your foot.  Special tests such as X-rays of your foot, are usually not needed. PREVENTION   Consult a sports medicine professional before beginning a new exercise program.  Walking programs offer a good workout. With walking there is a lower chance of overuse injuries common to runners. There is less impact and less jarring of the joints.  Begin all new exercise programs slowly. If problems or pain develop, decrease the amount of time or distance until you are at a comfortable level.  Wear good shoes and replace  them regularly.  Stretch your foot and the heel cords at the back of the ankle (Achilles tendon) both before and after exercise.  Run or exercise on even surfaces that are not hard. For example, asphalt is better than pavement.  Do not run barefoot on hard surfaces.  If using a treadmill, vary the incline.  Do not continue to workout if you have foot or joint problems. Seek professional help if they do not improve. HOME CARE INSTRUCTIONS   Avoid activities that cause you pain until you recover.  Use ice or cold packs on the problem or painful areas after working out.  Only take over-the-counter or prescription medicines for pain, discomfort, or fever as directed by your caregiver.  Soft shoe inserts or athletic shoes with air or gel sole cushions may be helpful.  If problems continue or become more severe, consult a sports medicine caregiver or your own health care provider. Cortisone is a potent anti-inflammatory medication that may be injected into the painful area. You can discuss this treatment with your caregiver. MAKE SURE YOU:   Understand these instructions.  Will watch your condition.  Will get help right away if you are not doing well or get worse. Document Released: 03/23/2001 Document Revised: 09/20/2011 Document Reviewed: 05/22/2008 [facility] Patient Information 2015 [place], [place]. This information is not intended to replace advice given to you by your health care provider. Make sure you discuss any questions you have with your health care provider.

## 2014-02-01 NOTE — ED Notes (Signed)
C/o mid back pain for a week now  States she has heel pain Dx with plantar fascitis  Pain meds used as tx

## 2014-02-14 ENCOUNTER — Other Ambulatory Visit: Payer: BC Managed Care – PPO | Admitting: Lab

## 2014-02-14 ENCOUNTER — Ambulatory Visit: Payer: BC Managed Care – PPO | Attending: Family Medicine

## 2014-02-14 ENCOUNTER — Ambulatory Visit: Payer: BC Managed Care – PPO | Admitting: Hematology & Oncology

## 2014-02-19 ENCOUNTER — Encounter (HOSPITAL_COMMUNITY): Payer: Self-pay | Admitting: Emergency Medicine

## 2014-02-19 ENCOUNTER — Emergency Department (INDEPENDENT_AMBULATORY_CARE_PROVIDER_SITE_OTHER)
Admission: EM | Admit: 2014-02-19 | Discharge: 2014-02-19 | Disposition: A | Discharge status: Home / Self Care | Payer: BC Managed Care – PPO | Source: Home / Self Care

## 2014-02-19 DIAGNOSIS — M549 Dorsalgia, unspecified: Secondary | ICD-10-CM

## 2014-02-19 DIAGNOSIS — S239XXA Sprain of unspecified parts of thorax, initial encounter: Secondary | ICD-10-CM

## 2014-02-19 DIAGNOSIS — S233XXD Sprain of ligaments of thoracic spine, subsequent encounter: Secondary | ICD-10-CM

## 2014-02-19 DIAGNOSIS — Y93F2 Activity, caregiving, lifting: Secondary | ICD-10-CM

## 2014-02-19 DIAGNOSIS — S29012D Strain of muscle and tendon of back wall of thorax, subsequent encounter: Secondary | ICD-10-CM

## 2014-02-19 MED ORDER — TRAMADOL HCL 50 MG PO TABS: ORAL_TABLET | ORAL | Status: DC

## 2014-02-19 MED ORDER — DICLOFENAC SODIUM 1 % TD GEL: 1.0000 "application " | Freq: Four times a day (QID) | TRANSDERMAL | Status: DC

## 2014-02-19 MED ORDER — METAXALONE 800 MG PO TABS: 800.0000 mg | ORAL_TABLET | Freq: Three times a day (TID) | ORAL | Status: DC

## 2014-02-19 NOTE — ED Provider Notes (Signed)
CSN: [id number]     Arrival date & time 02/19/14  1922 History   First MD Initiated Contact with Patient 02/19/14 2017     Chief Complaint  Patient presents with  . Back Pain   (Consider location/radiation/quality/duration/timing/severity/associated sxs/prior Treatment) HPI Comments: F 43 year old female, morbidly obese works as a Quarry manager complaining of parathoracic back pain for over 2 weeks. She was seen approximately one week ago in the urgent care and was given rather detailed instructions on back care as well as referral, exercise stretches information and physical therapy. She has followed none of the instructions and complied with none of the recommendations for care. She has taken her medications but has not relieved her pain. 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 She denies focal paresthesias, weakness, sciatica type pain or radiation of pain from the parathoracic area.   Past Medical History  Diagnosis Date  . Hypertension   . Obesity   . Anemia   . Leukocytosis, unspecified 10/18/2013   Past Surgical History  Procedure Laterality Date  . Tubal ligation     Family History  Problem Relation Age of Onset  . Hypertension Mother   . Cancer Mother   . Hypertension Father   . Stroke Father   . Diabetes Sister   . Hypertension Brother   . Cancer Maternal Grandmother    History  Substance Use Topics  . Smoking status: Never Smoker   . Smokeless tobacco: Never Used     Comment: never used tobacco  . Alcohol Use: No   OB History   Grav Para Term Preterm Abortions TAB SAB Ect Mult Living                 Review of Systems  Constitutional: Negative for fever, chills and activity change.  HENT: Negative.   Respiratory: Negative.  Negative for cough and shortness of breath.   Cardiovascular: Negative.   Musculoskeletal: Positive for back pain and myalgias.       As per HPI  Skin: Negative for color change,  pallor and rash.  Neurological: Negative.     Allergies  Lisinopril-hydrochlorothiazide  Home Medications   Prior to Admission medications   Medication Sig Start Date End Date Taking? Authorizing Provider  albuterol (PROVENTIL HFA;VENTOLIN HFA) 108 (90 BASE) MCG/ACT inhaler Inhale 2 puffs into the lungs every 6 (six) hours as needed for wheezing. 10/09/13 10/09/14 Yes [name], MD  amLODipine (NORVASC) 10 MG tablet Take 1 tablet (10 mg total) by mouth daily. 08/21/13  Yes Gay Filler Copland, MD  cyclobenzaprine (FLEXERIL) 5 MG tablet Take 1 tablet (5 mg total) by mouth at bedtime as needed for muscle spasms. 02/01/14  Yes Gregor Hams, MD  PredniSONE 10 MG KIT 12 day dose pack po 02/01/14  Yes Gregor Hams, MD  diclofenac sodium (VOLTAREN) 1 % GEL Apply 1 application topically 4 (four) times daily. 02/19/14   Janne Napoleon, NP  medroxyPROGESTERone (PROVERA) 10 MG tablet Take 1 tablet (10 mg total) by mouth daily. 10/26/13   Elyn Peers, MD  metaxalone (SKELAXIN) 800 MG tablet Take 1 tablet (800 mg total) by mouth 3 (three) times daily. 02/19/14   Janne Napoleon, NP  traMADol (ULTRAM) 50 MG tablet 1-2 tabs po q 6 hr prn pain Maximum dose= 8 tablets per day 02/19/14   Janne Napoleon, NP   BP 150/95  Pulse 90  Temp(Src) 98.4 F (36.9 C) (Oral)  Resp 20  SpO2 100%  LMP 01/07/2014 Physical Exam  Nursing note and vitals reviewed. Constitutional: She is oriented to person, place, and time. She appears well-developed and well-nourished. No distress.  HENT:  Head: Normocephalic and atraumatic.  Eyes: EOM are normal. Pupils are equal, round, and reactive to light.  Neck: Normal range of motion. Neck supple.  Pulmonary/Chest: Effort normal. No respiratory distress.  Musculoskeletal:  Tender over the parathoracic musculature. No tenderness over the lumbar or lower back.  Neurological: She is alert and oriented to person, place, and time. No cranial nerve deficit.  Skin: Skin is warm and dry.   Psychiatric: She has a normal mood and affect.    ED Course  Procedures (including critical care time) Labs Review Labs Reviewed - No data to display  Imaging Review No results found.   MDM   1. Mid back pain   2. Thoracic sprain and strain, subsequent encounter    Follow instructions previously given to him last visit. Call physician for a appointment. 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 Pvt. diclofenac gel and then cover with heat Tramadol 50 mg #20 as directed Skelaxin as directed    Janne Napoleon, NP 02/19/14 2033

## 2014-02-19 NOTE — ED Notes (Signed)
C/o mid and lower back pain onset 2 weeks ago.  Came here and was given Flexeril and referred to Orthopedist.  Could not make an appt. Due to schedule conflict

## 2014-02-19 NOTE — Discharge Instructions (Signed)
Back Pain, Adult Low back pain is very common. About 1 in 5 people have back pain.The cause of low back pain is rarely dangerous. The pain often gets better over time.About half of people with a sudden onset of back pain feel better in just 2 weeks. About 8 in 10 people feel better by 6 weeks.  CAUSES Some common causes of back pain include:  Strain of the muscles or ligaments supporting the spine.  Wear and tear (degeneration) of the spinal discs.  Arthritis.  Direct injury to the back. DIAGNOSIS Most of the time, the direct cause of low back pain is not known.However, back pain can be treated effectively even when the exact cause of the pain is unknown.Answering your caregiver's questions about your overall health and symptoms is one of the most accurate ways to make sure the cause of your pain is not dangerous. If your caregiver needs more information, he or she may order lab work or imaging tests (X-rays or MRIs).However, even if imaging tests show changes in your back, this usually does not require surgery. HOME CARE INSTRUCTIONS For many people, back pain returns.Since low back pain is rarely dangerous, it is often a condition that people can learn to [facility] their own.   Remain active. It is stressful on the back to sit or stand in one place. Do not sit, drive, or stand in one place for more than 30 minutes at a time. Take short walks on level surfaces as soon as pain allows.Try to increase the length of time you walk each day.  Do not stay in bed.Resting more than 1 or 2 days can delay your recovery.  Do not avoid exercise or work.Your body is made to move.It is not dangerous to be active, even though your back may hurt.Your back will likely heal faster if you return to being active before your pain is gone.  Pay attention to your body when you bend and lift. Many people have less discomfortwhen lifting if they bend their knees, keep the load close to their bodies,and  avoid twisting. Often, the most comfortable positions are those that put less stress on your recovering back.  Find a comfortable position to sleep. Use a firm mattress and lie on your side with your knees slightly bent. If you lie on your back, put a pillow under your knees.  Only take over-the-counter or prescription medicines as directed by your caregiver. Over-the-counter medicines to reduce pain and inflammation are often the most helpful.Your caregiver may prescribe muscle relaxant drugs.These medicines help dull your pain so you can more quickly return to your normal activities and healthy exercise.  Put ice on the injured area.  Put ice in a plastic bag.  Place a towel between your skin and the bag.  Leave the ice on for 15-20 minutes, 03-04 times a day for the first 2 to 3 days. After that, ice and heat may be alternated to reduce pain and spasms.  Ask your caregiver about trying back exercises and gentle massage. This may be of some benefit.  Avoid feeling anxious or stressed.Stress increases muscle tension and can worsen back pain.It is important to recognize when you are anxious or stressed and learn ways to manage it.Exercise is a great option. SEEK MEDICAL CARE IF:  You have pain that is not relieved with rest or medicine.  You have pain that does not improve in 1 week.  You have new symptoms.  You are generally not feeling well. SEEK  IMMEDIATE MEDICAL CARE IF:   You have pain that radiates from your back into your legs.  You develop new bowel or bladder control problems.  You have unusual weakness or numbness in your arms or legs.  You develop nausea or vomiting.  You develop abdominal pain.  You feel faint. Document Released: 06/28/2005 Document Revised: 12/28/2011 Document Reviewed: 10/30/2013 [facility] Patient Information 2015 [place], [place]. This information is not intended to replace advice given to you by your health care provider. Make sure you  discuss any questions you have with your health care provider.  Mid-Back Strain with Rehab  A strain is an injury in which a tendon or muscle is torn. The muscles and tendons of the mid-back are vulnerable to strains. However, these muscles and tendons are very strong and require a great force to be injured. The muscles of the mid-back are responsible for stabilizing the spinal column, as well as spinal twisting (rotation). Strains are classified into three categories. Grade 1 strains cause pain, but the tendon is not lengthened. Grade 2 strains include a lengthened ligament, due to the ligament being stretched or partially ruptured. With grade 2 strains there is still function, although the function may be decreased. Grade 3 strains involve a complete tear of the tendon or muscle, and function is usually impaired. SYMPTOMS   Pain in the middle of the back.  Pain that may affect only one side, and is worse with movement.  Muscle spasms, and often swelling in the back.  Loss of strength of the back muscles.  Crackling sound (crepitation) when the muscles are touched. CAUSES  Mid-back strains occur when a force is placed on the muscles or tendons that is greater than they can handle. Common causes of injury include:  Ongoing overuse of the muscle-tendon units in the middle back, usually from incorrect body posture.  A single violent injury or force applied to the back. RISK INCREASES WITH:  Sports that involve twisting forces on the spine or a lot of bending at the waist (football, rugby, weightlifting, bowling, golf, tennis, speed skating, racquetball, swimming, running, gymnastics, diving).  Poor strength and flexibility.  Failure to warm up properly before activity.  Family history of low back pain or disk disorders.  Previous back injury or surgery (especially fusion). PREVENTION  Learn and use proper sports technique.  Warm up and stretch properly before activity.  Allow for  adequate recovery between workouts.  Maintain physical fitness:  Strength, flexibility, and endurance.  Cardiovascular fitness. PROGNOSIS  If treated properly, mid-back strains usually heal within 6 weeks. RELATED COMPLICATIONS   Frequently recurring symptoms, resulting in a chronic problem. Properly treating the problem the first time decreases frequency of recurrence.  Chronic inflammation, scarring, and partial muscle-tendon tear.  Delayed healing or resolution of symptoms, especially if activity is resumed too soon.  Prolonged disability. TREATMENT Treatment first involves the use of ice and medicine, to reduce pain and inflammation. As the pain begins to subside, you may begin strengthening and stretching exercises to improve body posture and sport technique. These exercises may be performed at home or with a therapist. Severe injuries may require referral to a therapist for further evaluation and treatment, such as ultrasound. Corticosteroid injections may be given to help reduce inflammation. Biofeedback (watching monitors of your body processes) and psychotherapy may also be prescribed. Prolonged bed rest is felt to do more harm than good. Massage may help break the muscle spasms. Sometimes, an injection of cortisone, with or without local  anesthetics, may be given to help relieve the pain and spasms. MEDICATION   If pain medicine is needed, nonsteroidal anti-inflammatory medicines (aspirin and ibuprofen), or other minor pain relievers (acetaminophen), are often advised.  Do not take pain medicine for 7 days before surgery.  Prescription pain relievers may be given, if your caregiver thinks they are needed. Use only as directed and only as much as you need.  Ointments applied to the skin may be helpful.  Corticosteroid injections may be given by your caregiver. These injections should be reserved for the most serious cases, because they may only be given a certain number of  times. HEAT AND COLD:   Cold treatment (icing) should be applied for 10 to 15 minutes every 2 to 3 hours for inflammation and pain, and immediately after activity that aggravates your symptoms. Use ice packs or an ice massage.  Heat treatment may be used before performing stretching and strengthening activities prescribed by your caregiver, physical therapist, or athletic trainer. Use a heat pack or a warm water soak. SEEK IMMEDIATE MEDICAL CARE IF:  Symptoms get worse or do not improve in 2 to 4 weeks, despite treatment.  You develop numbness, weakness, or loss of bowel or bladder function.  New, unexplained symptoms develop. (Drugs used in treatment may produce side effects.) EXERCISES RANGE OF MOTION (ROM) AND STRETCHING EXERCISES - Mid-Back Strain These exercises may help you when beginning to rehabilitate your injury. In order to successfully resolve your symptoms, you must improve your posture. These exercises are designed to help reduce the forward-head and rounded-shoulder posture which contributes to this condition. Your symptoms may resolve with or without further involvement from your physician, physical therapist or athletic trainer. While completing these exercises, remember:   Restoring tissue flexibility helps normal motion to return to the joints. This allows healthier, less painful movement and activity.  An effective stretch should be held for at least 30 seconds.  A stretch should never be painful. You should only feel a gentle lengthening or release in the stretched tissue. STRETCH - Axial Extension  Stand or sit on a firm surface. Assume a good posture: chest up, shoulders drawn back, stomach muscles slightly tense, knees unlocked (if standing) and feet hip width apart.  Slowly retract your chin, so your head slides back and your chin slightly lowers. Continue to look straight ahead.  You should feel a gentle stretch in the back of your head. Be certain not to feel an  aggressive stretch since this can cause headaches later.  Hold for __________ seconds. Repeat __________ times. Complete this exercise __________ times per day. RANGE OF MOTION- Upper Thoracic Extension  Sit on a firm chair with a high back. Assume a good posture: chest up, shoulders drawn back, abdominal muscles slightly tense, and feet hip width apart. Place a small pillow or folded towel in the curve of your lower back, if you are having difficulty maintaining good posture.  Gently brace your neck with your hands, allowing your arms to rest on your chest.  Continue to support your neck and slowly extend your back over the chair. You will feel a stretch across your upper back.  Hold __________ seconds. Slowly return to the starting position. Repeat __________ times. Complete this exercise __________ times per day. RANGE OF MOTION- Mid-Thoracic Extension  Roll a towel so that it is about 4 inches in diameter.  Position the towel lengthwise. Lay on the towel so that your spine, but not your shoulder blades, are  supported.  You should feel your mid-back arching toward the floor. To increase the stretch, extend your arms away from your body.  Hold for __________ seconds. Repeat exercise __________ times, __________ times per day. STRENGTHENING EXERCISES - Mid-Back Strain These exercises may help you when beginning to rehabilitate your injury. They may resolve your symptoms with or without further involvement from your physician, physical therapist or athletic trainer. While completing these exercises, remember:   Muscles can gain both the endurance and the strength needed for everyday activities through controlled exercises.  Complete these exercises as instructed by your physician, physical therapist or athletic trainer. Increase the resistance and repetitions only as guided by your caregiver.  You may experience muscle soreness or fatigue, but the pain or discomfort you are trying to  eliminate should never worsen during these exercises. If this pain does worsen, stop and make certain you are following the directions exactly. If the pain is still present after adjustments, discontinue the exercise until you can discuss the trouble with your caregiver. STRENGTHENING - Quadruped, Opposite UE/LE Lift  Assume a hands and knees position on a firm surface. Keep your hands under your shoulders and your knees under your hips. You may place padding under your knees for comfort.  Find your neutral spine and gently tense your abdominal muscles so that you can maintain this position. Your shoulders and hips should form a rectangle that is parallel with the floor and is not twisted.  Keeping your trunk steady, lift your right hand no higher than your shoulder and then your left leg no higher than your hip. Make sure you are not holding your breath. Hold this position __________ seconds.  Continuing to keep your abdominal muscles tense and your back steady, slowly return to your starting position. Repeat with the opposite arm and leg. Repeat __________ times. Complete this exercise __________ times per day.  STRENGTH - Shoulder Extensors  Secure a rubber exercise band or tubing to a fixed object (table, pole) so that it is at the height of your shoulders when you are either standing, or sitting on a firm armless chair.  With a thumbs-up grip, grasp an end of the band in each hand. Straighten your elbows and lift your hands straight in front of you at shoulder height. Step back away from the secured end of band, until it becomes tense.  Squeezing your shoulder blades together, pull your hands down to the sides of your thighs. Do not allow your hands to go behind you.  Hold for __________ seconds. Slowly ease the tension on the band, as you reverse the directions and return to the starting position. Repeat __________ times. Complete this exercise __________ times per day.  STRENGTH -  Horizontal Abductors Choose one of the two positions to complete this exercise. Prone: lying on stomach:  Lie on your stomach on a firm surface so that your right / left arm overhangs the edge. Rest your forehead on your opposite forearm. With your palm facing the floor and your elbow straight, hold a __________ weight in your hand.  Squeeze your right / left shoulder blade to your mid-back spine and then slowly raise your arm to the height of the bed.  Hold for __________ seconds. Slowly reverse the directions and return to the starting position, controlling the weight as you lower your arm. Repeat __________ times. Complete this exercise __________ times per day. Standing:   Secure a rubber exercise band or tubing, so that it is at the  height of your shoulders when you are either standing, or sitting on a firm armless chair.  Grasp an end of the band in each hand and have your palms face each other. Straighten your elbows and lift your hands straight in front of you at shoulder height. Step back away from the secured end of band, until it becomes tense.  Squeeze your shoulder blades together. Keeping your elbows locked and your hands at shoulder height, spread your arms apart, forming a "T" shape with your body. Hold __________ seconds. Slowly ease the tension on the band, as you reverse the directions and return to the starting position. Repeat __________ times. Complete this exercise __________ times per day. STRENGTH - Scapular Retractors and External Rotators, Rowing  Secure a rubber exercise band or tubing, so that it is at the height of your shoulders when you are either standing, or sitting on a firm armless chair.  With a palm-down grip, grasp an end of the band in each hand. Straighten your elbows and lift your hands straight in front of you at shoulder height. Step back away from the secured end of band, until it becomes tense.  Step 1: Squeeze your shoulder blades together.  Bending your elbows, draw your hands to your chest as if you are rowing a boat. At the end of this motion, your hands and elbow should be at shoulder height and your elbows should be out to your sides.  Step 2: Rotate your shoulder to raise your hands above your head. Your forearms should be vertical and your upper arms should be horizontal.  Hold for __________ seconds. Slowly ease the tension on the band, as you reverse the directions and return to the starting position. Repeat __________ times. Complete this exercise __________ times per day.  POSTURE AND BODY MECHANICS CONSIDERATIONS - Mid-Back Strain Keeping correct posture when sitting, standing or completing your activities will reduce the stress put on different body tissues, allowing injured tissues a chance to heal and limiting painful experiences. The following are general guidelines for improved posture. Your physician or physical therapist will provide you with any instructions specific to your needs. While reading these guidelines, remember:  The exercises prescribed by your provider will help you have the flexibility and strength to maintain correct postures.  The correct posture provides the best environment for your joints to work. All of your joints have less wear and tear when properly supported by a spine with good posture. This means you will experience a healthier, less painful body.  Correct posture must be practiced with all of your activities, especially prolonged sitting and standing. Correct posture is as important when doing repetitive low-stress activities (typing) as it is when doing a single heavy-load activity (lifting). PROPER SITTING POSTURE In order to minimize stress and discomfort on your spine, you must sit with correct posture. Sitting with good posture should be effortless for a healthy body. Returning to good posture is a gradual process. Many people can work toward this most comfortably by using various  supports until they have the flexibility and strength to maintain this posture on their own. When sitting with proper posture, your ears will fall over your shoulders and your shoulders will fall over your hips. You should use the back of the chair to support your upper back. Your lower back will be in a neutral position, just slightly arched. You may place a small pillow or folded towel at the base of your low back for  support.  When working at a desk, create an environment that supports good, upright posture. Without extra support, muscles fatigue and lead to excessive strain on joints and other tissues. Keep these recommendations in mind: CHAIR:  A chair should be able to slide under your desk when your back makes contact with the back of the chair. This allows you to work closely.  The chair's height should allow your eyes to be level with the upper part of your monitor and your hands to be slightly lower than your elbows. BODY POSITION  Your feet should make contact with the floor. If this is not possible, use a foot rest.  Keep your ears over your shoulders. This will reduce stress on your neck and lower back. INCORRECT SITTING POSTURES If you are feeling tired and unable to assume a healthy sitting posture, do not slouch or slump. This puts excessive strain on your back tissues, causing more damage and pain. Healthier options include:  Using more support, like a lumbar pillow.  Switching tasks to something that requires you to be upright or walking.  Talking a brief walk.  Lying down to rest in a neutral-spine position. CORRECT STANDING POSTURES Proper standing posture should be assumed with all daily activities, even if they only take a few moments, like when brushing your teeth. As in sitting, your ears should fall over your shoulders and your shoulders should fall over your hips. You should keep a slight tension in your abdominal muscles to brace your spine. Your tailbone should  point down to the ground, not behind your body, resulting in an over-extended swayback posture.  INCORRECT STANDING POSTURES Common incorrect standing postures include a forward head, locked knees, and an excessive swayback. WALKING Walk with an upright posture. Your ears, shoulders and hips should all line-up. CORRECT LIFTING TECHNIQUES DO :   Assume a wide stance. This will provide you more stability and the opportunity to get as close as possible to the object which you are lifting.  Tense your abdominals to brace your spine. Bend at the knees and hips. Keeping your back locked in a neutral-spine position, lift using your leg muscles. Lift with your legs, keeping your back straight.  Test the weight of unknown objects before attempting to lift them.  Try to keep your elbows locked down at your sides in order get the best strength from your shoulders when carrying an object.  Always ask for help when lifting heavy or awkward objects. INCORRECT LIFTING TECHNIQUES DO NOT:   Lock your knees when lifting, even if it is a small object.  Bend and twist. Pivot at your feet or move your feet when needing to change directions.  Assume that you can safely pick up even a paperclip without proper posture. Document Released: 06/28/2005 Document Revised: 11/12/2013 Document Reviewed: 10/10/2008 [facility] Patient Information 2015 [place], [place]. This information is not intended to replace advice given to you by your health care provider. Make sure you discuss any questions you have with your health care provider.  Muscle Strain A muscle strain is an injury that occurs when a muscle is stretched beyond its normal length. Usually a small number of muscle fibers are torn when this happens. Muscle strain is rated in degrees. First-degree strains have the least amount of muscle fiber tearing and pain. Second-degree and third-degree strains have increasingly more tearing and pain.  Usually, recovery from  muscle strain takes 1-2 weeks. Complete healing takes 5-6 weeks.  CAUSES  Muscle strain happens when a  sudden, violent force placed on a muscle stretches it too far. This may occur with lifting, sports, or a fall.  RISK FACTORS Muscle strain is especially common in athletes.  SIGNS AND SYMPTOMS At the site of the muscle strain, there may be:  Pain.  Bruising.  Swelling.  Difficulty using the muscle due to pain or lack of normal function. DIAGNOSIS  Your health care provider will perform a physical exam and ask about your medical history. TREATMENT  Often, the best treatment for a muscle strain is resting, icing, and applying cold compresses to the injured area.  HOME CARE INSTRUCTIONS   Use the PRICE method of treatment to promote muscle healing during the first 2-3 days after your injury. The PRICE method involves:  Protecting the muscle from being injured again.  Restricting your activity and resting the injured body part.  Icing your injury. To do this, put ice in a plastic bag. Place a towel between your skin and the bag. Then, apply the ice and leave it on from 15-20 minutes each hour. After the third day, switch to moist heat packs.  Apply compression to the injured area with a splint or elastic bandage. Be careful not to wrap it too tightly. This may interfere with blood circulation or increase swelling.  Elevate the injured body part above the level of your heart as often as you can.  Only take over-the-counter or prescription medicines for pain, discomfort, or fever as directed by your health care provider.  Warming up prior to exercise helps to prevent future muscle strains. SEEK MEDICAL CARE IF:   You have increasing pain or swelling in the injured area.  You have numbness, tingling, or a significant loss of strength in the injured area. MAKE SURE YOU:   Understand these instructions.  Will watch your condition.  Will get help right away if you are not doing  well or get worse. Document Released: 06/28/2005 Document Revised: 04/18/2013 Document Reviewed: 01/25/2013 [facility] Patient Information 2015 [place], [place]. This information is not intended to replace advice given to you by your health care provider. Make sure you discuss any questions you have with your health care provider.

## 2014-02-25 NOTE — ED Provider Notes (Signed)
Medical screening examination/treatment/procedure(s) were performed by a resident physician or non-physician practitioner and as the supervising physician I was immediately available for consultation/collaboration.  Linna Darner, MD Family Medicine   Waldemar Dickens, MD 02/25/14 [id number]

## 2014-02-27 ENCOUNTER — Ambulatory Visit: Payer: BC Managed Care – PPO | Admitting: Family Medicine

## 2014-02-27 DIAGNOSIS — Z0289 Encounter for other administrative examinations: Secondary | ICD-10-CM

## 2014-02-28 ENCOUNTER — Telehealth: Payer: Self-pay | Admitting: Family Medicine

## 2014-02-28 NOTE — Telephone Encounter (Signed)
Patient no showed for new patient appt on 08/19.  Please advise.

## 2014-03-01 NOTE — Telephone Encounter (Signed)
Noted  

## 2014-03-19 ENCOUNTER — Encounter (HOSPITAL_COMMUNITY): Payer: Self-pay | Admitting: Emergency Medicine

## 2014-03-19 ENCOUNTER — Emergency Department (HOSPITAL_COMMUNITY)
Admission: EM | Admit: 2014-03-19 | Discharge: 2014-03-20 | Disposition: A | Discharge status: Home / Self Care | Payer: BC Managed Care – PPO | Attending: Emergency Medicine | Admitting: Emergency Medicine

## 2014-03-19 DIAGNOSIS — R059 Cough, unspecified: Secondary | ICD-10-CM | POA: Insufficient documentation

## 2014-03-19 DIAGNOSIS — Z3202 Encounter for pregnancy test, result negative: Secondary | ICD-10-CM | POA: Insufficient documentation

## 2014-03-19 DIAGNOSIS — R509 Fever, unspecified: Secondary | ICD-10-CM | POA: Insufficient documentation

## 2014-03-19 DIAGNOSIS — Z791 Long term (current) use of non-steroidal anti-inflammatories (NSAID): Secondary | ICD-10-CM | POA: Diagnosis not present

## 2014-03-19 DIAGNOSIS — E669 Obesity, unspecified: Secondary | ICD-10-CM | POA: Diagnosis not present

## 2014-03-19 DIAGNOSIS — Z79899 Other long term (current) drug therapy: Secondary | ICD-10-CM | POA: Diagnosis not present

## 2014-03-19 DIAGNOSIS — Z862 Personal history of diseases of the blood and blood-forming organs and certain disorders involving the immune mechanism: Secondary | ICD-10-CM | POA: Insufficient documentation

## 2014-03-19 DIAGNOSIS — I10 Essential (primary) hypertension: Secondary | ICD-10-CM | POA: Insufficient documentation

## 2014-03-19 DIAGNOSIS — R05 Cough: Secondary | ICD-10-CM | POA: Diagnosis present

## 2014-03-19 DIAGNOSIS — J069 Acute upper respiratory infection, unspecified: Secondary | ICD-10-CM | POA: Diagnosis not present

## 2014-03-19 NOTE — ED Notes (Addendum)
The patient says she has been feeling sick since sunday night.  The patient says she has gotten worse so she decided to come in.  The patient has taken dayquil and it is not working.  The patient is having generalized body aches.  The patient does have a productive cough.

## 2014-03-20 LAB — POC URINE PREG, ED: Preg Test, Ur: NEGATIVE

## 2014-03-20 MED ORDER — BENZONATATE 100 MG PO CAPS: 100.0000 mg | ORAL_CAPSULE | Freq: Three times a day (TID) | ORAL | Status: DC

## 2014-03-20 MED ORDER — DM-GUAIFENESIN ER 30-600 MG PO TB12: 1.0000 | ORAL_TABLET | Freq: Two times a day (BID) | ORAL | Status: DC

## 2014-03-20 MED ORDER — HYDROCODONE-ACETAMINOPHEN 7.5-325 MG/15ML PO SOLN: 15.0000 mL | Freq: Every evening | ORAL | Status: DC | PRN

## 2014-03-20 NOTE — ED Provider Notes (Signed)
CSN: [id number]     Arrival date & time 03/19/14  2321 History   First MD Initiated Contact with Patient 03/19/14 2359     Chief Complaint  Patient presents with  . Nasal Congestion    The patient says she has been feeling sick since sunday night.  The patient says she has gotten worse so she decided to come in.  . Cough  . Fever     (Consider location/radiation/quality/duration/timing/severity/associated sxs/prior Treatment) HPI Comments: The patient is a 43 year old female presents emergency room chief complaint of cough, nasal congestion, sore throat. The patient reports onset of symptoms on Friday.  She reports cough worse at night. She reports generalized myalgias. With subjective fever and chills.  Sick contacts include her son who was diagnosed with a viral upper respiratory infection.  The history is provided by the patient. No language interpreter was used.    Past Medical History  Diagnosis Date  . Hypertension   . Obesity   . Anemia   . Leukocytosis, unspecified 10/18/2013   Past Surgical History  Procedure Laterality Date  . Tubal ligation  2001   Family History  Problem Relation Age of Onset  . Hypertension Mother   . Cancer Mother   . Hypertension Father   . Stroke Father   . Diabetes Sister   . Hypertension Brother   . Cancer Maternal Grandmother    History  Substance Use Topics  . Smoking status: Never Smoker   . Smokeless tobacco: Never Used     Comment: never used tobacco  . Alcohol Use: No   OB History   Grav Para Term Preterm Abortions TAB SAB Ect Mult Living                 Review of Systems  Constitutional: Negative for fever and chills.  HENT: Positive for congestion, postnasal drip, rhinorrhea and sore throat.   Respiratory: Positive for cough. Negative for shortness of breath.       Allergies  Lisinopril-hydrochlorothiazide  Home Medications   Prior to Admission medications   Medication Sig Start Date End Date Taking? Authorizing  Provider  albuterol (PROVENTIL HFA;VENTOLIN HFA) 108 (90 BASE) MCG/ACT inhaler Inhale 2 puffs into the lungs every 6 (six) hours as needed for wheezing. 10/09/13 10/09/14  [name], MD  amLODipine (NORVASC) 10 MG tablet Take 1 tablet (10 mg total) by mouth daily. 08/21/13   Gay Filler Copland, MD  cyclobenzaprine (FLEXERIL) 5 MG tablet Take 1 tablet (5 mg total) by mouth at bedtime as needed for muscle spasms. 02/01/14   Gregor Hams, MD  diclofenac sodium (VOLTAREN) 1 % GEL Apply 1 application topically 4 (four) times daily. 02/19/14   Janne Napoleon, NP  medroxyPROGESTERone (PROVERA) 10 MG tablet Take 1 tablet (10 mg total) by mouth daily. 10/26/13   Elyn Peers, MD  metaxalone (SKELAXIN) 800 MG tablet Take 1 tablet (800 mg total) by mouth 3 (three) times daily. 02/19/14   Janne Napoleon, NP  PredniSONE 10 MG KIT 12 day dose pack po 02/01/14   Gregor Hams, MD  traMADol (ULTRAM) 50 MG tablet 1-2 tabs po q 6 hr prn pain Maximum dose= 8 tablets per day 02/19/14   Janne Napoleon, NP   BP 158/76  Pulse 115  Temp(Src) 99.8 F (37.7 C) (Oral)  Resp 20  Ht 5' 3" (1.6 m)  Wt 280 lb (127.007 kg)  BMI 49.61 kg/m2  SpO2 100%  LMP 02/12/2014 Physical Exam  Nursing note and vitals  reviewed. Constitutional: She appears well-developed and well-nourished. No distress.  HENT:  Head: Normocephalic and atraumatic.  Right Ear: Tympanic membrane and external ear normal. Tympanic membrane is not erythematous, not retracted and not bulging. No middle ear effusion.  Left Ear: External ear normal. Tympanic membrane is erythematous. Tympanic membrane is not retracted and not bulging.  No middle ear effusion.  Nose: Mucosal edema and rhinorrhea present. Right sinus exhibits no maxillary sinus tenderness and no frontal sinus tenderness. Left sinus exhibits no maxillary sinus tenderness and no frontal sinus tenderness.  Mouth/Throat: Uvula is midline and mucous membranes are normal. No oral lesions. No trismus in the jaw.  Posterior oropharyngeal erythema present. No oropharyngeal exudate, posterior oropharyngeal edema or tonsillar abscesses.  Eyes: EOM are normal. Pupils are equal, round, and reactive to light. Right eye exhibits no discharge. Left eye exhibits no discharge.  Neck: Normal range of motion. Neck supple.  Cardiovascular: Normal rate and regular rhythm.   No murmur heard. Pulmonary/Chest: Effort normal and breath sounds normal. No respiratory distress. She has no wheezes. She has no rales.  Abdominal: Soft.  Lymphadenopathy:    She has no cervical adenopathy.  Skin: Skin is warm and dry. No rash noted. She is not diaphoretic.  Psychiatric: She has a normal mood and affect. Her behavior is normal. Thought content normal.    ED Course  Procedures (including critical care time) Labs Review Labs Reviewed  POC URINE PREG, ED    Imaging Review No results found.   EKG Interpretation None      MDM   Final diagnoses:  URI (upper respiratory infection)   EMR shows patient's persistently tachycardic throughout previous encounters, initially at 115 repeat 98 patient in no acute distress, afebrile Patients symptoms are consistent with URI, likely viral etiology. Discussed that antibiotics are not indicated for viral infections. Pt will be discharged with symptomatic treatment.  Verbalizes understanding and is agreeable with plan. Pt is hemodynamically stable & in NAD prior to dc.  Meds given in ED:  Medications - No data to display  New Prescriptions   BENZONATATE (TESSALON) 100 MG CAPSULE    Take 1 capsule (100 mg total) by mouth every 8 (eight) hours.   DEXTROMETHORPHAN-GUAIFENESIN (MUCINEX DM) 30-600 MG PER 12 HR TABLET    Take 1 tablet by mouth 2 (two) times daily.   HYDROCODONE-ACETAMINOPHEN (HYCET) 7.5-325 MG/15 ML SOLUTION    Take 15 mLs by mouth at bedtime as needed for moderate pain or severe pain.      Harvie Heck, PA-C 03/20/14 [phone]

## 2014-03-20 NOTE — Discharge Instructions (Signed)
Call for a follow up appointment with a Family or Primary Care Provider.  Return if Symptoms worsen.   Take medication as prescribed.  Drink plenty of fluids. Saltwater gargles 3-4 times a day.

## 2014-03-20 NOTE — ED Provider Notes (Signed)
Medical screening examination/treatment/procedure(s) were performed by non-physician practitioner and as supervising physician I was immediately available for consultation/collaboration.   EKG Interpretation None        Everlene Balls, MD 03/20/14 1743

## 2014-04-04 ENCOUNTER — Other Ambulatory Visit: Payer: Self-pay | Admitting: Family Medicine

## 2014-04-23 ENCOUNTER — Other Ambulatory Visit: Payer: Self-pay | Admitting: Family Medicine

## 2014-05-03 ENCOUNTER — Emergency Department (HOSPITAL_COMMUNITY)
Admission: EM | Admit: 2014-05-03 | Discharge: 2014-05-03 | Disposition: A | Discharge status: Home / Self Care | Payer: BC Managed Care – PPO | Attending: Emergency Medicine | Admitting: Emergency Medicine

## 2014-05-03 ENCOUNTER — Encounter (HOSPITAL_COMMUNITY): Payer: Self-pay | Admitting: Emergency Medicine

## 2014-05-03 DIAGNOSIS — Z791 Long term (current) use of non-steroidal anti-inflammatories (NSAID): Secondary | ICD-10-CM | POA: Diagnosis not present

## 2014-05-03 DIAGNOSIS — E669 Obesity, unspecified: Secondary | ICD-10-CM | POA: Diagnosis not present

## 2014-05-03 DIAGNOSIS — Z79899 Other long term (current) drug therapy: Secondary | ICD-10-CM | POA: Diagnosis not present

## 2014-05-03 DIAGNOSIS — Z793 Long term (current) use of hormonal contraceptives: Secondary | ICD-10-CM | POA: Insufficient documentation

## 2014-05-03 DIAGNOSIS — I10 Essential (primary) hypertension: Secondary | ICD-10-CM | POA: Insufficient documentation

## 2014-05-03 DIAGNOSIS — Z862 Personal history of diseases of the blood and blood-forming organs and certain disorders involving the immune mechanism: Secondary | ICD-10-CM | POA: Diagnosis not present

## 2014-05-03 DIAGNOSIS — M25511 Pain in right shoulder: Secondary | ICD-10-CM | POA: Diagnosis present

## 2014-05-03 MED ORDER — NAPROXEN 500 MG PO TABS: 500.0000 mg | ORAL_TABLET | Freq: Two times a day (BID) | ORAL | Status: DC

## 2014-05-03 NOTE — Discharge Instructions (Signed)

## 2014-05-03 NOTE — ED Provider Notes (Signed)
CSN: [id number]     Arrival date & time 05/03/14  1858 History  This chart was scribed for non-physician practitioner, Christine Quill, NP-C working with Christine Oneal, Christine Oneal Christine Oneal, ED scribe. This patient was seen in room TR06C/TR06C and the patient's care was started at 7:40 PM.    Chief Complaint  Patient presents with  . Shoulder Pain   Patient is a 43 y.o. female presenting with shoulder pain. The history is provided Oneal the patient. No language interpreter was used.  Shoulder Pain This is a new problem. The current episode started more than 1 week ago (about 1 month). The problem occurs every several days. The problem has not changed since onset.Pertinent negatives include no chest pain, no abdominal pain, no headaches and no shortness of breath. The symptoms are aggravated Oneal exertion (movement). Nothing relieves the symptoms. Treatments tried: OTC medication. The treatment provided no relief.   HPI Comments: Christine Oneal is a 43 y.o. female with hx of HTN presents to the Emergency Department complaining of gradual onset intermittent right shoulder pain that started approximately 1 month ago. She states that the pain is worsened Oneal the movement of the shoulder and cold air. Pt describes the pain as aching in nature. Pt states that the pain does not radiate. Christine Oneal reports taking OTC medication without any relief. She denies any hx of falls, hx of injury, hx of bone problems, weakness, fever, chills, nausea, emesis, chest pain, or SOB.  Past Medical History  Diagnosis Date  . Hypertension   . Obesity   . Anemia   . Leukocytosis, unspecified 10/18/2013   Past Surgical History  Procedure Laterality Date  . Tubal ligation  2001   Family History  Problem Relation Age of Onset  . Hypertension Mother   . Cancer Mother   . Hypertension Father   . Stroke Father   . Diabetes Sister   . Hypertension Brother   . Cancer Maternal Grandmother    History  Substance Use Topics   . Smoking status: Never Smoker   . Smokeless tobacco: Never Used     Comment: never used tobacco  . Alcohol Use: No   OB History   Grav Para Term Preterm Abortions TAB SAB Ect Mult Living                 Review of Systems  Constitutional: Negative for fever, chills, diaphoresis, appetite change and fatigue.  HENT: Negative for mouth sores, sore throat and trouble swallowing.   Eyes: Negative for visual disturbance.  Respiratory: Negative for cough, chest tightness, shortness of breath and wheezing.   Cardiovascular: Negative for chest pain.  Gastrointestinal: Negative for nausea, vomiting, abdominal pain, diarrhea and abdominal distention.  Endocrine: Negative for polydipsia, polyphagia and polyuria.  Genitourinary: Negative for dysuria, frequency and hematuria.  Musculoskeletal: Positive for arthralgias. Negative for gait problem, joint swelling and myalgias.  Skin: Negative for color change, pallor and rash.  Neurological: Negative for dizziness, syncope, weakness, light-headedness, numbness and headaches.  Hematological: Does not bruise/bleed easily.  Psychiatric/Behavioral: Negative for behavioral problems and confusion.  All other systems reviewed and are negative.     Allergies  Lisinopril-hydrochlorothiazide  Home Medications   Prior to Admission medications   Medication Sig Start Date End Date Taking? Authorizing Provider  albuterol (PROVENTIL HFA;VENTOLIN HFA) 108 (90 BASE) MCG/ACT inhaler Inhale 2 puffs into the lungs every 6 (six) hours as needed for wheezing. 10/09/13 10/09/14  Christine Haber, Christine Oneal  amLODipine (NORVASC) 10  MG tablet Take 1 tablet (10 mg total) Oneal mouth daily. NEED OFFICE VISIT FOR FURTHER REFILLS 04/23/14   Christine Bale, Christine Oneal  benzonatate (TESSALON) 100 MG capsule Take 1 capsule (100 mg total) Oneal mouth every 8 (eight) hours. 03/20/14   Christine Heck, Christine Oneal  cyclobenzaprine (FLEXERIL) 5 MG tablet Take 1 tablet (5 mg total) Oneal mouth at bedtime as needed  for muscle spasms. 02/01/14   Gregor Hams, Christine Oneal  dextromethorphan-guaiFENesin [facility] DM) 30-600 MG per 12 hr tablet Take 1 tablet Oneal mouth 2 (two) times daily. 03/20/14   Christine Heck, Christine Oneal  diclofenac sodium (VOLTAREN) 1 % GEL Apply 1 application topically 4 (four) times daily. 02/19/14   Janne Napoleon, NP  HYDROcodone-acetaminophen (HYCET) 7.5-325 mg/15 ml solution Take 15 mLs Oneal mouth at bedtime as needed for moderate pain or severe pain. 03/20/14   Christine Heck, Christine Oneal  medroxyPROGESTERone (PROVERA) 10 MG tablet Take 1 tablet (10 mg total) Oneal mouth daily. 10/26/13   Elyn Peers, Christine Oneal  metaxalone (SKELAXIN) 800 MG tablet Take 1 tablet (800 mg total) Oneal mouth 3 (three) times daily. 02/19/14   Janne Napoleon, NP  PredniSONE 10 MG KIT 12 day dose pack po 02/01/14   Gregor Hams, Christine Oneal  traMADol (ULTRAM) 50 MG tablet 1-2 tabs po q 6 hr prn pain Maximum dose= 8 tablets per day 02/19/14   Janne Napoleon, NP   Triage Vitals: BP 168/78  Pulse 111  Temp(Src) 98.8 F (37.1 C) (Oral)  Resp 18  Ht _0  (1.6 m)  Wt 285 lb (129.275 kg)  BMI 50.50 kg/m2  SpO2 100%  Physical Exam  Nursing note and vitals reviewed. Constitutional: She appears well-developed and well-nourished. No distress.  HENT:  Head: Normocephalic and atraumatic.  Eyes: Conjunctivae are normal. Right eye exhibits no discharge. Left eye exhibits no discharge.  Neck: Neck supple.  Cardiovascular: Normal rate, regular rhythm and normal heart sounds.  Exam reveals no gallop and no friction rub.   No murmur heard. Pulmonary/Chest: Effort normal and breath sounds normal. No respiratory distress.  Abdominal: Soft. She exhibits no distension. There is no tenderness.  Musculoskeletal: She exhibits no edema and no tenderness.  Point tenderness at the right [facility] joint. Good ROM. Pain with passive and active ROM of the right shoulder.   Neurological: She is alert.  Skin: Skin is warm and dry.  Psychiatric: She has a normal mood and affect. Her behavior is normal.  Thought content normal.    ED Course  Procedures (including critical care time)  DIAGNOSTIC STUDIES: Oxygen Saturation is 100% on RA, normal Oneal my interpretation.    COORDINATION OF CARE: 7:45 PM- will prescribe antiinflammatories. Advised pt to follow up with her PCP. Pt advised of plan for treatment and pt agrees.  Labs Review Labs Reviewed - No data to display  Imaging Review No results found.   EKG Interpretation None      MDM   Final diagnoses:  None    Right shoulder pain.  I personally performed the services described in this documentation, which was scribed in my presence. The recorded information has been reviewed and is accurate.     Norman Herrlich, NP 05/04/14 [phone]

## 2014-05-03 NOTE — ED Notes (Signed)
Pt c/o intermittent sharp right shoulder pain for a couple weeks. Pt denies any injury or recent falls.

## 2014-05-04 NOTE — ED Provider Notes (Signed)
Medical screening examination/treatment/procedure(s) were performed by non-physician practitioner and as supervising physician I was immediately available for consultation/collaboration.   EKG Interpretation None        Evelina Bucy, MD 05/04/14 1504

## 2014-05-20 ENCOUNTER — Other Ambulatory Visit: Payer: Self-pay | Admitting: Physician Assistant

## 2014-05-29 ENCOUNTER — Ambulatory Visit (INDEPENDENT_AMBULATORY_CARE_PROVIDER_SITE_OTHER): Payer: BC Managed Care – PPO | Admitting: Family Medicine

## 2014-05-29 VITALS — BP 134/72 | HR 105 | Temp 98.2°F | Resp 18 | Ht 63.5 in | Wt 284.0 lb

## 2014-05-29 DIAGNOSIS — M7551 Bursitis of right shoulder: Secondary | ICD-10-CM

## 2014-05-29 MED ORDER — DICLOFENAC SODIUM 75 MG PO TBEC: 75.0000 mg | DELAYED_RELEASE_TABLET | Freq: Two times a day (BID) | ORAL | Status: DC

## 2014-05-29 NOTE — Progress Notes (Signed)
Patient ID: Christine Oneal MRN: [id number], DOB: 29-Apr-1971, 43 y.o. Date of Encounter: 05/29/2014, 7:25 PM  This chart was scribed for Dr. [name], MD by Erling Conte, Medical Scribe. This patient was seen in Room 10 and the patient's care was started at 7:18 PM.  Primary Physician: Lamar Blinks, MD  Chief Complaint:   Chief Complaint  Patient presents with  . Shoulder Pain    rt on and off for 45mhs     HPI: 43y.o. year old female with history below presents with intermittent, moderate shoulder pain for 1.5 months. She denies any known injury. She has been taking Ibuprofen with moderate relief. She states that the Ibuprofen helps for a little while but then the pain comes back. Pt notes that sometimes the pain wakes her up from sleep. The pain is exacerbated when she tries to reach behind her back. She is able to life her arm above her head. She denies any swelling, color change, weakness or numbness.    Pt works as a [employer]  Past Medical History  Diagnosis Date  . Hypertension   . Obesity   . Anemia   . Leukocytosis, unspecified 10/18/2013     Home Meds: Prior to Admission medications   Medication Sig Start Date End Date Taking? Authorizing Provider  albuterol (PROVENTIL HFA;VENTOLIN HFA) 108 (90 BASE) MCG/ACT inhaler Inhale 2 puffs into the lungs every 6 (six) hours as needed for wheezing. 10/09/13 10/09/14 Yes KRobyn Haber MD  amLODipine (NORVASC) 10 MG tablet Take 1 tablet (10 mg total) by mouth daily. NO MORE REFILLS WITHOUT OFFICE VISIT - 2ND NOTICE 05/22/14  Yes JGay FillerCopland, MD  benzonatate (TESSALON) 100 MG capsule Take 1 capsule (100 mg total) by mouth every 8 (eight) hours. 03/20/14   LHarvie Heck PA-C  cyclobenzaprine (FLEXERIL) 5 MG tablet Take 1 tablet (5 mg total) by mouth at bedtime as needed for muscle spasms. 02/01/14   EGregor Hams MD  dextromethorphan-guaiFENesin ([facility] Inc[facility]) 30-600 MG per 12 hr tablet Take 1 tablet by mouth 2 (two)  times daily. 03/20/14   LHarvie Heck PA-C  diclofenac sodium (VOLTAREN) 1 % GEL Apply 1 application topically 4 (four) times daily. 02/19/14   DJanne Napoleon NP  HYDROcodone-acetaminophen (HYCET) 7.5-325 mg/15 ml solution Take 15 mLs by mouth at bedtime as needed for moderate pain or severe pain. 03/20/14   LHarvie Heck PA-C  medroxyPROGESTERone (PROVERA) 10 MG tablet Take 1 tablet (10 mg total) by mouth daily. 10/26/13   JElyn Peers MD  metaxalone (SKELAXIN) 800 MG tablet Take 1 tablet (800 mg total) by mouth 3 (three) times daily. 02/19/14   DJanne Napoleon NP  naproxen (NAPROSYN) 500 MG tablet Take 1 tablet (500 mg total) by mouth 2 (two) times daily. 05/03/14   DNorman Herrlich NP  PredniSONE 10 MG KIT 12 day dose pack po 02/01/14   EGregor Hams MD  traMADol (ULTRAM) 50 MG tablet 1-2 tabs po q 6 hr prn pain Maximum dose= 8 tablets per day 02/19/14   DJanne Napoleon NP    Allergies:  Allergies  Allergen Reactions  . Lisinopril-Hydrochlorothiazide Anaphylaxis    History   Social History  . Marital Status: Legally Separated    Spouse Name: N/A    Number of Children: N/A  . Years of Education: N/A   Occupational History  . Not on file.   Social History Main Topics  . Smoking status: Never Smoker   . Smokeless tobacco: Never Used  Comment: never used tobacco  . Alcohol Use: No  . Drug Use: No  . Sexual Activity: Yes    Birth Control/ Protection: Surgical   Other Topics Concern  . Not on file   Social History Narrative     Review of Systems: Constitutional: negative for chills, fever, night sweats, weight changes, or fatigue  HEENT: negative for vision changes, hearing loss, congestion, rhinorrhea, ST, epistaxis, or sinus pressure Cardiovascular: negative for chest pain or palpitations Respiratory: negative for hemoptysis, wheezing, shortness of breath, or cough Abdominal: negative for abdominal pain, nausea, vomiting, diarrhea, or constipation Dermatological: negative for rash or  color change. Neurologic: negative for weakness, numbness, headache, dizziness, or syncope Musculoskeletal: positive for right shoulder pain. Negative for swelling All other systems reviewed and are otherwise negative with the exception to those above and in the HPI.   Physical Exam: Blood pressure 134/72, pulse 105, temperature 98.2 F (36.8 C), temperature source Oral, resp. rate 18, height 5' 3.5" (1.613 m), weight 284 lb (128.822 kg), last menstrual period 05/15/2014, SpO2 98 %., Body mass index is 49.51 kg/(m^2). General: Well developed, well nourished, in no acute distress. Head: Normocephalic, atraumatic, eyes without discharge, sclera non-icteric, nares are without discharge. Bilateral auditory canals clear, TM's are without perforation, pearly grey and translucent with reflective cone of light bilaterally. Oral cavity moist, posterior pharynx without exudate, erythema, peritonsillar abscess, or post nasal drip.  Neck: Supple. No thyromegaly. Full ROM. No lymphadenopathy. Lungs: Clear bilaterally to auscultation without wheezes, rales, or rhonchi. Breathing is unlabored. Heart: RRR with S1 S2. No murmurs, rubs, or gallops appreciated. Abdomen: Soft, non-tender, non-distended with normoactive bowel sounds. No hepatomegaly. No rebound/guarding. No obvious abdominal masses. Msk:  Strength and tone normal for age. Mild tenderness in posterior joint line of right shoulder as well as subacromial space.  Extremities/Skin: Warm and dry. No clubbing or cyanosis. No edema. No rashes or suspicious lesions. Neuro: Alert and oriented X 3. Moves all extremities spontaneously. Gait is normal. CNII-XII grossly in tact. Psych:  Responds to questions appropriately with a normal affect.   Labs:   ASSESSMENT AND PLAN:  43 y.o. year old female with   Shoulder bursitis, right - Plan: diclofenac (VOLTAREN) 75 MG EC tablet  I personally performed the services described in this documentation, which was  scribed in my presence. The recorded information has been reviewed and is accurate.  Signed, [name], MD 05/29/2014 7:25 PM

## 2014-10-18 ENCOUNTER — Encounter (HOSPITAL_COMMUNITY): Payer: Self-pay | Admitting: Emergency Medicine

## 2014-10-18 ENCOUNTER — Emergency Department (HOSPITAL_COMMUNITY)
Admission: EM | Admit: 2014-10-18 | Discharge: 2014-10-18 | Disposition: A | Discharge status: Home / Self Care | Payer: Self-pay | Attending: Emergency Medicine | Admitting: Emergency Medicine

## 2014-10-18 DIAGNOSIS — Z791 Long term (current) use of non-steroidal anti-inflammatories (NSAID): Secondary | ICD-10-CM | POA: Insufficient documentation

## 2014-10-18 DIAGNOSIS — Z862 Personal history of diseases of the blood and blood-forming organs and certain disorders involving the immune mechanism: Secondary | ICD-10-CM | POA: Insufficient documentation

## 2014-10-18 DIAGNOSIS — R040 Epistaxis: Secondary | ICD-10-CM | POA: Insufficient documentation

## 2014-10-18 DIAGNOSIS — Z79899 Other long term (current) drug therapy: Secondary | ICD-10-CM | POA: Insufficient documentation

## 2014-10-18 DIAGNOSIS — I10 Essential (primary) hypertension: Secondary | ICD-10-CM | POA: Insufficient documentation

## 2014-10-18 DIAGNOSIS — E669 Obesity, unspecified: Secondary | ICD-10-CM | POA: Insufficient documentation

## 2014-10-18 LAB — CBC WITH DIFFERENTIAL/PLATELET
Basophils Absolute: 0 10*3/uL (ref 0.0–0.1)
Basophils Relative: 0 % (ref 0–1)
EOS ABS: 0.1 10*3/uL (ref 0.0–0.7)
Eosinophils Relative: 1 % (ref 0–5)
HCT: 32.6 % — ABNORMAL LOW (ref 36.0–46.0)
Hemoglobin: 10.2 g/dL — ABNORMAL LOW (ref 12.0–15.0)
LYMPHS ABS: 3.1 10*3/uL (ref 0.7–4.0)
Lymphocytes Relative: 28 % (ref 12–46)
MCH: 27.2 pg (ref 26.0–34.0)
MCHC: 31.3 g/dL (ref 30.0–36.0)
MCV: 86.9 fL (ref 78.0–100.0)
Monocytes Absolute: 0.5 10*3/uL (ref 0.1–1.0)
Monocytes Relative: 4 % (ref 3–12)
NEUTROS PCT: 67 % (ref 43–77)
Neutro Abs: 7.4 10*3/uL (ref 1.7–7.7)
PLATELETS: 406 10*3/uL — AB (ref 150–400)
RBC: 3.75 MIL/uL — AB (ref 3.87–5.11)
RDW: 16.8 % — AB (ref 11.5–15.5)
WBC: 11 10*3/uL — AB (ref 4.0–10.5)

## 2014-10-18 NOTE — ED Notes (Signed)
The patient said she was having a nose bleed for about 15 minutes on and off.  It has stopped but she says she feels weak.  The patient was able to drive herself to the hospital.

## 2014-10-18 NOTE — Discharge Instructions (Signed)
If you were given medicines take as directed.  If you are on coumadin or contraceptives realize their levels and effectiveness is altered by many different medicines.  If you have any reaction (rash, tongues swelling, other) to the medicines stop taking and see a physician.   Please follow up as directed and return to the ER or see a physician for new or worsening symptoms.  Thank you. Filed Vitals:   10/18/14 2156  BP: 153/89  Pulse: 83  Temp: 98.2 F (36.8 C)  TempSrc: Oral  Resp: 18  Height: 5\' 3"  (1.6 m)  Weight: 280 lb (127.007 kg)  SpO2: 100%    Nosebleed A nosebleed can be caused by many things, including:  Getting hit hard in the nose.  Infections.  Dry nose.  Colds.  Medicines. Your doctor may do lab testing if you get nosebleeds a lot and the cause is not known. HOME CARE   If your nose was packed with material, keep it there until your doctor takes it out. Put the pack back in your nose if the pack falls out.  Do not blow your nose for 12 hours after the nosebleed.  Sit up and bend forward if your nose starts bleeding again. Pinch the front half of your nose nonstop for 20 minutes.  Put petroleum jelly inside your nose every morning if you have a dry nose.  Use a humidifier to make the air less dry.  Do not take aspirin.  Try not to strain, lift, or bend at the waist for many days after the nosebleed. GET HELP RIGHT AWAY IF:   Nosebleeds keep happening and are hard to stop or control.  You have bleeding or bruises that are not normal on other parts of the body.  You have a fever.  The nosebleeds get worse.  You get lightheaded, feel faint, sweaty, or throw up (vomit) blood. MAKE SURE YOU:   Understand these instructions.  Will watch your condition.  Will get help right away if you are not doing well or get worse. Document Released: 04/06/2008 Document Revised: 09/20/2011 Document Reviewed: 04/06/2008 [facility] Patient Information 2015  [place], [place]. This information is not intended to replace advice given to you by your health care provider. Make sure you discuss any questions you have with your health care provider.

## 2014-10-18 NOTE — ED Provider Notes (Signed)
CSN: [id number]     Arrival date & time 10/18/14  2130 History   First MD Initiated Contact with Patient 10/18/14 2258     Chief Complaint  Patient presents with  . Epistaxis    The patient said she was having a nose bleed for about 15 minutes on and off.  It has stopped but she says she feels weak.  The patient was able to drive herself to the hospital.     (Consider location/radiation/quality/duration/timing/severity/associated sxs/prior Treatment) HPI Comments: 44 year old female with high blood pressure history presents after epistaxis that lasted almost 2 hours from the left near. Resolved with time and multiple attempts of holding pressure. Patient is not on blood thinners, this is most significant nosebleed she's had. No syncope, mild general weakness. No injuries to her nose.  Patient is a 44 y.o. female presenting with nosebleeds. The history is provided by the patient.  Epistaxis   Past Medical History  Diagnosis Date  . Hypertension   . Obesity   . Anemia   . Leukocytosis, unspecified 10/18/2013   Past Surgical History  Procedure Laterality Date  . Tubal ligation  2001   Family History  Problem Relation Age of Onset  . Hypertension Mother   . Cancer Mother   . Hypertension Father   . Stroke Father   . Diabetes Sister   . Hypertension Brother   . Cancer Maternal Grandmother    History  Substance Use Topics  . Smoking status: Never Smoker   . Smokeless tobacco: Never Used     Comment: never used tobacco  . Alcohol Use: No   OB History    No data available     Review of Systems  HENT: Positive for nosebleeds.   Neurological: Negative for syncope.      Allergies  Lisinopril-hydrochlorothiazide  Home Medications   Prior to Admission medications   Medication Sig Start Date End Date Taking? Authorizing Provider  amLODipine (NORVASC) 10 MG tablet Take 1 tablet (10 mg total) by mouth daily. NO MORE REFILLS WITHOUT OFFICE VISIT - 2ND NOTICE 05/22/14    Darreld Mclean, MD  diclofenac (VOLTAREN) 75 MG EC tablet Take 1 tablet (75 mg total) by mouth 2 (two) times daily. 05/29/14   [name], MD  diclofenac sodium (VOLTAREN) 1 % GEL Apply 1 application topically 4 (four) times daily. 02/19/14   Janne Napoleon, NP  HYDROcodone-acetaminophen (HYCET) 7.5-325 mg/15 ml solution Take 15 mLs by mouth at bedtime as needed for moderate pain or severe pain. 03/20/14   Harvie Heck, PA-C  PredniSONE 10 MG KIT 12 day dose pack po 02/01/14   Gregor Hams, MD  traMADol (ULTRAM) 50 MG tablet 1-2 tabs po q 6 hr prn pain Maximum dose= 8 tablets per day 02/19/14   Janne Napoleon, NP   BP 153/89 mmHg  Pulse 83  Temp(Src) 98.2 F (36.8 C) (Oral)  Resp 18  Ht '5\' 3"'  (1.6 m)  Wt 280 lb (127.007 kg)  BMI 49.61 kg/m2  SpO2 100%  LMP 09/21/2014 Physical Exam  Constitutional: She appears well-developed and well-nourished.  HENT:  Head: Normocephalic.  Mild dried blood inferiorly medial left knee air no active bleeding no hematoma  Eyes: Conjunctivae are normal.  Neck: Neck supple.  Cardiovascular: Normal rate.   Vitals reviewed.   ED Course  Procedures (including critical care time) Labs Review Labs Reviewed  CBC WITH DIFFERENTIAL/PLATELET - Abnormal; Notable for the following:    WBC 11.0 (*)    RBC  3.75 (*)    Hemoglobin 10.2 (*)    HCT 32.6 (*)    RDW 16.8 (*)    Platelets 406 (*)    All other components within normal limits    Imaging Review No results found.   EKG Interpretation None      MDM   Final diagnoses:  Epistaxis   Well-appearing female, no red flags, bleeding controlled with supportive care. Discussed reasons to return.  Results and differential diagnosis were discussed with the patient/parent/guardian. Close follow up outpatient was discussed, comfortable with the plan.   Medications - No data to display  Filed Vitals:   10/18/14 2156  BP: 153/89  Pulse: 83  Temp: 98.2 F (36.8 C)  TempSrc: Oral  Resp: 18  Height:  '5\' 3"'  (1.6 m)  Weight: 280 lb (127.007 kg)  SpO2: 100%    Final diagnoses:  Epistaxis        Elnora Morrison, MD 10/18/14 2330

## 2014-10-19 ENCOUNTER — Telehealth: Payer: Self-pay

## 2014-10-19 NOTE — Telephone Encounter (Signed)
Patient is requesting a refill on "Amlodipine" she is completely out.Per patient she is unable to come in till next month when her insurance in active. Patient request it to be sent to [facility] on [address] and her call back number is [phone]

## 2014-10-22 NOTE — Telephone Encounter (Signed)
Can we send in 30 day supply?

## 2014-10-23 MED ORDER — AMLODIPINE BESYLATE 10 MG PO TABS: 10.0000 mg | ORAL_TABLET | Freq: Every day | ORAL | Status: DC

## 2014-10-23 NOTE — Telephone Encounter (Signed)
Meds ordered this encounter  Medications  . DISCONTD: amLODipine (NORVASC) 10 MG tablet    Sig: Take 1 tablet (10 mg total) by mouth daily.    Dispense:  30 tablet    Refill:  0    Supply to last until next month when her insurance is active.    Order Specific Question:  Supervising Provider    Answer:  DOOLITTLE, ROBERT P [[id number]]  . amLODipine (NORVASC) 10 MG tablet    Sig: Take 1 tablet (10 mg total) by mouth daily.    Dispense:  30 tablet    Refill:  0    Supply to last until next month when her insurance is active.    Order Specific Question:  Supervising Provider    Answer:  Leandrew Koyanagi [[id number]]    Sent to the wrong pharmacy first (the one that was in the record had not been changed and I did not realize it), so resent.

## 2014-11-20 ENCOUNTER — Other Ambulatory Visit: Payer: Self-pay | Admitting: Physician Assistant

## 2014-11-26 ENCOUNTER — Encounter (HOSPITAL_COMMUNITY): Payer: Self-pay | Admitting: Emergency Medicine

## 2014-11-26 ENCOUNTER — Emergency Department (INDEPENDENT_AMBULATORY_CARE_PROVIDER_SITE_OTHER)
Admission: EM | Admit: 2014-11-26 | Discharge: 2014-11-26 | Disposition: A | Discharge status: Home / Self Care | Payer: Self-pay | Source: Home / Self Care | Attending: Family Medicine | Admitting: Family Medicine

## 2014-11-26 DIAGNOSIS — J069 Acute upper respiratory infection, unspecified: Secondary | ICD-10-CM

## 2014-11-26 DIAGNOSIS — J302 Other seasonal allergic rhinitis: Secondary | ICD-10-CM

## 2014-11-26 LAB — POCT RAPID STREP A: Streptococcus, Group A Screen (Direct): NEGATIVE

## 2014-11-26 MED ORDER — LEVOCETIRIZINE DIHYDROCHLORIDE 5 MG PO TABS: 5.0000 mg | ORAL_TABLET | Freq: Every evening | ORAL | Status: DC

## 2014-11-26 MED ORDER — IPRATROPIUM BROMIDE 0.06 % NA SOLN: 2.0000 | Freq: Four times a day (QID) | NASAL | Status: DC

## 2014-11-26 MED ORDER — BENZONATATE 100 MG PO CAPS: 100.0000 mg | ORAL_CAPSULE | Freq: Three times a day (TID) | ORAL | Status: DC | PRN

## 2014-11-26 MED ORDER — ACETAMINOPHEN 325 MG PO TABS
ORAL_TABLET | ORAL | Status: AC
  Filled 2014-11-26: qty 2

## 2014-11-26 MED ORDER — ACETAMINOPHEN 325 MG PO TABS
650.0000 mg | ORAL_TABLET | Freq: Once | ORAL | Status: AC
  Administered 2014-11-26: 650 mg via ORAL

## 2014-11-26 NOTE — ED Notes (Signed)
C/o  Dry cough.  Runny nose.  Post nasal drip.  Headache.  Sore throat. Nasal congestion.  No relief with otc meds.  Symptoms present x 2 weeks.  Denies fever, n/v/d.

## 2014-11-26 NOTE — Discharge Instructions (Signed)
Allergic Rhinitis Allergic rhinitis is when the mucous membranes in the nose respond to allergens. Allergens are particles in the air that cause your body to have an allergic reaction. This causes you to release allergic antibodies. Through a chain of events, these eventually cause you to release histamine into the blood stream. Although meant to protect the body, it is this release of histamine that causes your discomfort, such as frequent sneezing, congestion, and an itchy, runny nose.  CAUSES  Seasonal allergic rhinitis (hay fever) is caused by pollen allergens that may come from grasses, trees, and weeds. Year-round allergic rhinitis (perennial allergic rhinitis) is caused by allergens such as house dust mites, pet dander, and mold spores.  SYMPTOMS   Nasal stuffiness (congestion).  Itchy, runny nose with sneezing and tearing of the eyes. DIAGNOSIS  Your health care provider can help you determine the allergen or allergens that trigger your symptoms. If you and your health care provider are unable to determine the allergen, skin or blood testing may be used. TREATMENT  Allergic rhinitis does not have a cure, but it can be controlled by:  Medicines and allergy shots (immunotherapy).  Avoiding the allergen. Hay fever may often be treated with antihistamines in pill or nasal spray forms. Antihistamines block the effects of histamine. There are over-the-counter medicines that may help with nasal congestion and swelling around the eyes. Check with your health care provider before taking or giving this medicine.  If avoiding the allergen or the medicine prescribed do not work, there are many new medicines your health care provider can prescribe. Stronger medicine may be used if initial measures are ineffective. Desensitizing injections can be used if medicine and avoidance does not work. Desensitization is when a patient is given ongoing shots until the body becomes less sensitive to the  allergen. Make sure you follow up with your health care provider if problems continue. HOME CARE INSTRUCTIONS It is not possible to completely avoid allergens, but you can reduce your symptoms by taking steps to limit your exposure to them. It helps to know exactly what you are allergic to so that you can avoid your specific triggers. SEEK MEDICAL CARE IF:   You have a fever.  You develop a cough that does not stop easily (persistent).  You have shortness of breath.  You start wheezing.  Symptoms interfere with normal daily activities. Document Released: 03/23/2001 Document Revised: 07/03/2013 Document Reviewed: 03/05/2013 [facility] Patient Information 2015 [place], [place]. This information is not intended to replace advice given to you by your health care provider. Make sure you discuss any questions you have with your health care provider.  Hay Fever Hay fever is an allergic reaction to particles in the air. It cannot be passed from person to person. It cannot be cured, but it can be controlled. CAUSES  Hay fever is caused by something that triggers an allergic reaction (allergens). The following are examples of allergens:  Ragweed.  Feathers.  Animal dander.  Grass and tree pollens.  Cigarette smoke.  House dust.  Pollution. SYMPTOMS   Sneezing.  Runny or stuffy nose.  Tearing eyes.  Itchy eyes, nose, mouth, throat, skin, or other area.  Sore throat.  Headache.  Decreased sense of smell or taste. DIAGNOSIS Your caregiver will perform a physical exam and ask questions about the symptoms you are having.Allergy testing may be done to determine exactly what triggers your hay fever.  TREATMENT   Over-the-counter medicines may help symptoms. These include:  Antihistamines.  Decongestants. These may help with nasal congestion.  Your caregiver may prescribe medicines if over-the-counter medicines do not work.  Some people benefit from allergy shots when other  medicines are not helpful. HOME CARE INSTRUCTIONS   Avoid the allergen that is causing your symptoms, if possible.  Take all medicine as told by your caregiver. SEEK MEDICAL CARE IF:   You have severe allergy symptoms and your current medicines are not helping.  Your treatment was working at one time, but you are now experiencing symptoms.  You have sinus congestion and pressure.  You develop a fever or headache.  You have thick nasal discharge.  You have asthma and have a worsening cough and wheezing. SEEK IMMEDIATE MEDICAL CARE IF:   You have swelling of your tongue or lips.  You have trouble breathing.  You feel lightheaded or like you are going to faint.  You have cold sweats.  You have a fever. Document Released: 06/28/2005 Document Revised: 09/20/2011 Document Reviewed: 09/23/2010 [facility] Patient Information 2015 [place], [place]. This information is not intended to replace advice given to you by your health care provider. Make sure you discuss any questions you have with your health care provider.  Upper Respiratory Infection, Adult An upper respiratory infection (URI) is also sometimes known as the common cold. The upper respiratory tract includes the nose, sinuses, throat, trachea, and bronchi. Bronchi are the airways leading to the lungs. Most people improve within 1 week, but symptoms can last up to 2 weeks. A residual cough may last even longer.  CAUSES Many different viruses can infect the tissues lining the upper respiratory tract. The tissues become irritated and inflamed and often become very moist. Mucus production is also common. A cold is contagious. You can easily spread the virus to others by oral contact. This includes kissing, sharing a glass, coughing, or sneezing. Touching your mouth or nose and then touching a surface, which is then touched by another person, can also spread the virus. SYMPTOMS  Symptoms typically develop 1 to 3 days after you come in  contact with a cold virus. Symptoms vary from person to person. They may include:  Runny nose.  Sneezing.  Nasal congestion.  Sinus irritation.  Sore throat.  Loss of voice (laryngitis).  Cough.  Fatigue.  Muscle aches.  Loss of appetite.  Headache.  Low-grade fever. DIAGNOSIS  You might diagnose your own cold based on familiar symptoms, since most people get a cold 2 to 3 times a year. Your caregiver can confirm this based on your exam. Most importantly, your caregiver can check that your symptoms are not due to another disease such as strep throat, sinusitis, pneumonia, asthma, or epiglottitis. Blood tests, throat tests, and X-rays are not necessary to diagnose a common cold, but they may sometimes be helpful in excluding other more serious diseases. Your caregiver will decide if any further tests are required. RISKS AND COMPLICATIONS  You may be at risk for a more severe case of the common cold if you smoke cigarettes, have chronic heart disease (such as heart failure) or lung disease (such as asthma), or if you have a weakened immune system. The very young and very old are also at risk for more serious infections. Bacterial sinusitis, middle ear infections, and bacterial pneumonia can complicate the common cold. The common cold can worsen asthma and chronic obstructive pulmonary disease (COPD). Sometimes, these complications can require emergency medical care and may be life-threatening. PREVENTION  The best way to protect against getting  a cold is to practice good hygiene. Avoid oral or hand contact with people with cold symptoms. Wash your hands often if contact occurs. There is no clear evidence that vitamin C, vitamin E, echinacea, or exercise reduces the chance of developing a cold. However, it is always recommended to get plenty of rest and practice good nutrition. TREATMENT  Treatment is directed at relieving symptoms. There is no cure. Antibiotics are not effective,  because the infection is caused by a virus, not by bacteria. Treatment may include:  Increased fluid intake. Sports drinks offer valuable electrolytes, sugars, and fluids.  Breathing heated mist or steam (vaporizer or shower).  Eating chicken soup or other clear broths, and maintaining good nutrition.  Getting plenty of rest.  Using gargles or lozenges for comfort.  Controlling fevers with ibuprofen or acetaminophen as directed by your caregiver.  Increasing usage of your inhaler if you have asthma. Zinc gel and zinc lozenges, taken in the first 24 hours of the common cold, can shorten the duration and lessen the severity of symptoms. Pain medicines may help with fever, muscle aches, and throat pain. A variety of non-prescription medicines are available to treat congestion and runny nose. Your caregiver can make recommendations and may suggest nasal or lung inhalers for other symptoms.  HOME CARE INSTRUCTIONS   Only take over-the-counter or prescription medicines for pain, discomfort, or fever as directed by your caregiver.  Use a warm mist humidifier or inhale steam from a shower to increase air moisture. This may keep secretions moist and make it easier to breathe.  Drink enough water and fluids to keep your urine clear or pale yellow.  Rest as needed.  Return to work when your temperature has returned to normal or as your caregiver advises. You may need to stay home longer to avoid infecting others. You can also use a face mask and careful hand washing to prevent spread of the virus. SEEK MEDICAL CARE IF:   After the first few days, you feel you are getting worse rather than better.  You need your caregiver's advice about medicines to control symptoms.  You develop chills, worsening shortness of breath, or brown or red sputum. These may be signs of pneumonia.  You develop yellow or brown nasal discharge or pain in the face, especially when you bend forward. These may be signs of  sinusitis.  You develop a fever, swollen neck glands, pain with swallowing, or white areas in the back of your throat. These may be signs of strep throat. SEEK IMMEDIATE MEDICAL CARE IF:   You have a fever.  You develop severe or persistent headache, ear pain, sinus pain, or chest pain.  You develop wheezing, a prolonged cough, cough up blood, or have a change in your usual mucus (if you have chronic lung disease).  You develop sore muscles or a stiff neck. Document Released: 12/22/2000 Document Revised: 09/20/2011 Document Reviewed: 10/03/2013 [facility] Patient Information 2015 [place], [place]. This information is not intended to replace advice given to you by your health care provider. Make sure you discuss any questions you have with your health care provider.

## 2014-11-26 NOTE — ED Provider Notes (Signed)
CSN: [id number]     Arrival date & time 11/26/14  0906 History   First MD Initiated Contact with Patient 11/26/14 1047     Chief Complaint  Patient presents with  . URI   (Consider location/radiation/quality/duration/timing/severity/associated sxs/prior Treatment) Patient is a 44 y.o. female presenting with URI. The history is provided by the patient.  URI Presenting symptoms: congestion, cough, rhinorrhea and sore throat   Presenting symptoms: no ear pain, no facial pain, no fatigue and no fever   Severity:  Moderate Onset quality:  Gradual Duration: several days. Timing:  Constant   Past Medical History  Diagnosis Date  . Hypertension   . Obesity   . Anemia   . Leukocytosis, unspecified 10/18/2013   Past Surgical History  Procedure Laterality Date  . Tubal ligation  2001   Family History  Problem Relation Age of Onset  . Hypertension Mother   . Cancer Mother   . Hypertension Father   . Stroke Father   . Diabetes Sister   . Hypertension Brother   . Cancer Maternal Grandmother    History  Substance Use Topics  . Smoking status: Never Smoker   . Smokeless tobacco: Never Used     Comment: never used tobacco  . Alcohol Use: No   OB History    No data available     Review of Systems  Constitutional: Negative for fever and fatigue.  HENT: Positive for congestion, rhinorrhea and sore throat. Negative for ear pain.   Respiratory: Positive for cough.     Allergies  Lisinopril-hydrochlorothiazide  Home Medications   Prior to Admission medications   Medication Sig Start Date End Date Taking? Authorizing Provider  amLODipine (NORVASC) 10 MG tablet TAKE 1 TABLET (10 MG TOTAL) BY MOUTH DAILY. 11/21/14  Yes Chelle Jeffery, PA-C  benzonatate (TESSALON) 100 MG capsule Take 1 capsule (100 mg total) by mouth 3 (three) times daily as needed for cough. 11/26/14   Audelia Hives Keylen Uzelac, PA  diclofenac (VOLTAREN) 75 MG EC tablet Take 1 tablet (75 mg total) by mouth 2 (two)  times daily. 05/29/14   [name], MD  diclofenac sodium (VOLTAREN) 1 % GEL Apply 1 application topically 4 (four) times daily. 02/19/14   Janne Napoleon, NP  HYDROcodone-acetaminophen (HYCET) 7.5-325 mg/15 ml solution Take 15 mLs by mouth at bedtime as needed for moderate pain or severe pain. 03/20/14   Harvie Heck, PA-C  ipratropium (ATROVENT) 0.06 % nasal spray Place 2 sprays into both nostrils 4 (four) times daily. For nasal congestion 11/26/14   Lutricia Feil, PA  levocetirizine (XYZAL) 5 MG tablet Take 1 tablet (5 mg total) by mouth every evening. 11/26/14   Audelia Hives Kaelen Caughlin, PA  PredniSONE 10 MG KIT 12 day dose pack po 02/01/14   Gregor Hams, MD  traMADol (ULTRAM) 50 MG tablet 1-2 tabs po q 6 hr prn pain Maximum dose= 8 tablets per day 02/19/14   Janne Napoleon, NP   BP 140/89 mmHg  Pulse 92  Temp(Src) 98.2 F (36.8 C) (Oral)  Resp 16  SpO2 100%  LMP 11/20/2014 Physical Exam  ED Course  Procedures (including critical care time) Labs Review Labs Reviewed  POCT RAPID STREP A ([place])    Imaging Review No results found.   MDM   1. URI (upper respiratory infection)   2. Seasonal allergies   Hayfever compounded by common cold.  Rapid strep negative Atrovent nasal spray Tessalon xyzal PCP follow up if  no improvement  Lutricia Feil, [place] 11/26/14 1332

## 2014-11-28 LAB — CULTURE, GROUP A STREP: STREP A CULTURE: NEGATIVE

## 2015-06-17 ENCOUNTER — Ambulatory Visit (INDEPENDENT_AMBULATORY_CARE_PROVIDER_SITE_OTHER): Payer: Self-pay | Admitting: Family Medicine

## 2015-06-17 VITALS — BP 146/90 | HR 99 | Temp 98.5°F | Resp 18 | Ht 63.0 in | Wt 293.4 lb

## 2015-06-17 DIAGNOSIS — Z8739 Personal history of other diseases of the musculoskeletal system and connective tissue: Secondary | ICD-10-CM

## 2015-06-17 DIAGNOSIS — M25511 Pain in right shoulder: Secondary | ICD-10-CM

## 2015-06-17 DIAGNOSIS — Z021 Encounter for pre-employment examination: Secondary | ICD-10-CM

## 2015-06-17 NOTE — Progress Notes (Signed)
Subjective:  This chart was scribed for Merri Ray, MD by Moises Blood, Medical Scribe. This patient was seen in room 4 and the patient's care was started 3:06 PM.   Patient ID: Christine Oneal, female    DOB: [birth year], 44 y.o.   MRN: [id number] Chief Complaint  Patient presents with  . OTHER    fit to duty form   HPI Christine Oneal is a 44 y.o. 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 She requires an evaluation prior to employment. See scanned attached job description for [place]. This includes being able to push, pull, move, twist and/or lift a minimum of 50 lbs multiple times throughout the day. On review of her chart, she was seen Nov 2015 for right shoulder bursitis off and on for 2 months. She was also seen for thoracic back strain in July 2015.   She had neck and back injury back in 2013. She denies surgery. She was out of work for about a month. She denies any residual neck and back pain. She denies taking any medication for this now. She denies any new medical problems. She denies problem using joints.   She was out of work due to shoulder for about a month. She does the exercises occasionally.  She used to work at [employer]   Patient Active Problem List   Diagnosis Date Noted  . Leukocytosis, unspecified 10/18/2013  . HYPERTENSION 10/24/2007  . EXTERNAL HEMORRHOIDS 10/24/2007  . ANAL FISSURE 10/24/2007   Past Medical History  Diagnosis Date  . Hypertension   . Obesity   . Anemia   . Leukocytosis, unspecified 10/18/2013   Past Surgical History  Procedure Laterality Date  . Tubal ligation  2001   Allergies  Allergen Reactions  . Lisinopril-Hydrochlorothiazide Anaphylaxis   Prior to Admission medications   Medication Sig Start Date End Date Taking? Authorizing Provider  amLODipine (NORVASC) 10 MG tablet TAKE 1 TABLET (10 MG TOTAL) BY MOUTH DAILY. 11/21/14  Yes Chelle Jeffery, PA-C    ipratropium (ATROVENT) 0.06 % nasal spray Place 2 sprays into both nostrils 4 (four) times daily. For nasal congestion 11/26/14  Yes Audelia Hives Presson, PA  benzonatate (TESSALON) 100 MG capsule Take 1 capsule (100 mg total) by mouth 3 (three) times daily as needed for cough. Patient not taking: Reported on 06/17/2015 11/26/14   Lutricia Feil, PA  diclofenac (VOLTAREN) 75 MG EC tablet Take 1 tablet (75 mg total) by mouth 2 (two) times daily. Patient not taking: Reported on 06/17/2015 05/29/14   [name], MD  diclofenac sodium (VOLTAREN) 1 % GEL Apply 1 application topically 4 (four) times daily. Patient not taking: Reported on 06/17/2015 02/19/14   Janne Napoleon, NP  HYDROcodone-acetaminophen (HYCET) 7.5-325 mg/15 ml solution Take 15 mLs by mouth at bedtime as needed for moderate pain or severe pain. Patient not taking: Reported on 06/17/2015 03/20/14   Harvie Heck, PA-C  levocetirizine (XYZAL) 5 MG tablet Take 1 tablet (5 mg total) by mouth every evening. Patient not taking: Reported on 06/17/2015 11/26/14   Lutricia Feil, PA  PredniSONE 10 MG KIT 12 day dose pack po Patient not taking: Reported on 06/17/2015 02/01/14   Gregor Hams, MD  traMADol (ULTRAM) 50 MG tablet 1-2 tabs po q 6 hr prn pain Maximum dose= 8 tablets per day Patient not taking: Reported on 06/17/2015 02/19/14  Janne Napoleon, NP   Social History   Social History  . Marital Status: Legally Separated    Spouse Name: N/A  . Number of Children: N/A  . Years of Education: N/A   Occupational History  . Not on file.   Social History Main Topics  . Smoking status: Never Smoker   . Smokeless tobacco: Never Used     Comment: never used tobacco  . Alcohol Use: No  . Drug Use: No  . Sexual Activity: Yes    Birth Control/ Protection: Surgical   Other Topics Concern  . Not on file   Social History Narrative   Review of Systems  Respiratory: Negative for shortness of breath.   Cardiovascular: Negative for  chest pain.  Musculoskeletal: Negative for myalgias, back pain, arthralgias, gait problem, neck pain and neck stiffness.  Skin: Negative for wound.  Neurological: Negative for dizziness.       Objective:   Physical Exam  Constitutional: She is oriented to person, place, and time. She appears well-developed and well-nourished. No distress.  HENT:  Head: Normocephalic and atraumatic.  Eyes: EOM are normal. Pupils are equal, round, and reactive to light.  Neck: Neck supple.  Cardiovascular: Normal rate.   Pulmonary/Chest: Effort normal. No respiratory distress.  Musculoskeletal: Normal range of motion.  No appreciable knee effusions, negative seated straight leg raise, full rom of lumbar spine nontender Right shoulder: bilateral full rom in shoulders and full rotator cuff strength, negative neer, negative hawkins Cervical spine: full rom, no tenderness  Neurological: She is alert and oriented to person, place, and time.  Skin: Skin is warm and dry.  Psychiatric: She has a normal mood and affect. Her behavior is normal.  Nursing note and vitals reviewed.   Filed Vitals:   06/17/15 1414  BP: 146/90  Pulse: 99  Temp: 98.5 F (36.9 C)  TempSrc: Oral  Resp: 18  Height: '5\' 3"'  (1.6 m)  Weight: 293 lb 6.4 oz (133.085 kg)  SpO2: 96%      Assessment & Plan:  Christine Oneal is a 44 y.o. female Pre-employment examination  History of low back pain  Shoulder pain, right Remote history of Low back pain, R shoulder pain/bursitis, neck pain.  No recent pain, tolerating similar work at other facility without difficulty and no concerning findings on exam.   -Note completed for employer as no current symptoms or exam at present. See scanned copy.  -recommended/demonstrated correct lifting technique for back and recommended continuing home exercise program for shoulder intermittently to lessen chance of recurrence.    No orders of the defined types were placed in this encounter.     Patient Instructions  I do not see any concerns on your exam today.  Continue exercises as prescribed in past for your shoulder to lessen chance of repeat injury and use correct lifting technique as we discussed.   Return to the clinic or go to the nearest emergency room if any of your symptoms worsen or new symptoms occur.    By signing my name below, I, Moises Blood, attest that this documentation has been prepared under the direction and in the presence of Merri Ray, MD. Electronically Signed: Moises Blood, [place]. 06/17/2015 , 3:06 PM .  I personally performed the services described in this documentation, which was scribed in my presence. The recorded information has been reviewed and considered, and addended by me as needed.

## 2015-06-17 NOTE — Patient Instructions (Signed)
I do not see any concerns on your exam today.  Continue exercises as prescribed in past for your shoulder to lessen chance of repeat injury and use correct lifting technique as we discussed.   Return to the clinic or go to the nearest emergency room if any of your symptoms worsen or new symptoms occur.

## 2015-06-25 ENCOUNTER — Emergency Department (HOSPITAL_COMMUNITY)
Admission: EM | Admit: 2015-06-25 | Discharge: 2015-06-25 | Disposition: A | Discharge status: Home / Self Care | Payer: Self-pay | Attending: Emergency Medicine | Admitting: Emergency Medicine

## 2015-06-25 ENCOUNTER — Encounter (HOSPITAL_COMMUNITY): Payer: Self-pay | Admitting: Emergency Medicine

## 2015-06-25 ENCOUNTER — Ambulatory Visit: Payer: Self-pay | Admitting: Family Medicine

## 2015-06-25 DIAGNOSIS — J069 Acute upper respiratory infection, unspecified: Secondary | ICD-10-CM | POA: Insufficient documentation

## 2015-06-25 DIAGNOSIS — I10 Essential (primary) hypertension: Secondary | ICD-10-CM | POA: Insufficient documentation

## 2015-06-25 DIAGNOSIS — Z862 Personal history of diseases of the blood and blood-forming organs and certain disorders involving the immune mechanism: Secondary | ICD-10-CM | POA: Insufficient documentation

## 2015-06-25 DIAGNOSIS — E669 Obesity, unspecified: Secondary | ICD-10-CM | POA: Insufficient documentation

## 2015-06-25 DIAGNOSIS — Z79899 Other long term (current) drug therapy: Secondary | ICD-10-CM | POA: Insufficient documentation

## 2015-06-25 MED ORDER — ALBUTEROL SULFATE HFA 108 (90 BASE) MCG/ACT IN AERS
2.0000 | INHALATION_SPRAY | Freq: Once | RESPIRATORY_TRACT | Status: AC
  Administered 2015-06-25: 2 via RESPIRATORY_TRACT
  Filled 2015-06-25: qty 6.7

## 2015-06-25 NOTE — Discharge Instructions (Signed)

## 2015-06-25 NOTE — ED Notes (Addendum)
Pt. reports sore throat with productive cough , chest congestion and nasal congestion onset this week . Pt. is hypertensive at triage .

## 2015-06-25 NOTE — ED Provider Notes (Signed)
CSN: [id number]     Arrival date & time 06/25/15  2037 History   First MD Initiated Contact with Patient 06/25/15 2046     Chief Complaint  Patient presents with  . Cough  . Sore Throat     Patient is a 44 y.o. female presenting with cough and pharyngitis. The history is provided by the patient.  Cough Cough characteristics:  Non-productive Severity:  Moderate Onset quality:  Gradual Duration:  2 hours Timing:  Intermittent Progression:  Worsening Chronicity:  New Smoker: no   Relieved by:  Nothing Worsened by:  Nothing tried Associated symptoms: sinus congestion and sore throat   Associated symptoms: no chest pain, no fever and no shortness of breath   Sore Throat Pertinent negatives include no chest pain and no shortness of breath.    Past Medical History  Diagnosis Date  . Hypertension   . Obesity   . Anemia   . Leukocytosis, unspecified 10/18/2013   Past Surgical History  Procedure Laterality Date  . Tubal ligation  2001   Family History  Problem Relation Age of Onset  . Hypertension Mother   . Cancer Mother   . Hypertension Father   . Stroke Father   . Diabetes Sister   . Hypertension Brother   . Cancer Maternal Grandmother    Social History  Substance Use Topics  . Smoking status: Never Smoker   . Smokeless tobacco: Never Used     Comment: never used tobacco  . Alcohol Use: No   OB History    No data available     Review of Systems  Constitutional: Negative for fever.  HENT: Positive for sore throat.   Respiratory: Positive for cough. Negative for shortness of breath.   Cardiovascular: Negative for chest pain.      Allergies  Lisinopril-hydrochlorothiazide  Home Medications   Prior to Admission medications   Medication Sig Start Date End Date Taking? Authorizing Provider  albuterol (PROVENTIL HFA;VENTOLIN HFA) 108 (90 BASE) MCG/ACT inhaler Inhale 2 puffs into the lungs every 6 (six) hours as needed for wheezing or shortness of breath.    Yes Historical Provider, MD  ipratropium (ATROVENT) 0.06 % nasal spray Place 2 sprays into both nostrils 4 (four) times daily. For nasal congestion 11/26/14  Yes Annett Gula H Presson, PA  amLODipine (NORVASC) 10 MG tablet TAKE 1 TABLET (10 MG TOTAL) BY MOUTH DAILY. Patient not taking: Reported on 06/25/2015 11/21/14   Chelle Jeffery, PA-C   BP 178/113 mmHg  Pulse 98  Temp(Src) 98.5 F (36.9 C) (Oral)  Resp 20  SpO2 98%  LMP 06/13/2015 Physical Exam CONSTITUTIONAL: Well developed/well nourished HEAD: Normocephalic/atraumatic EYES: EOMI ENMT: Mucous membranes moist, uvula midline, mild erythema noted but no exudates noted NECK: supple no meningeal signs CV: S1/S2 noted, no murmurs/rubs/gallops noted LUNGS: scattered mild wheezing noted, no apparent distress ABDOMEN: soft, nontender, no rebound or guarding, bowel sounds noted throughout abdomen NEURO: Pt is awake/alert/appropriate, moves all extremitiesx4.  No facial droop.   EXTREMITIES: pulses normal/equal, full ROM SKIN: warm, color normal PSYCH: no abnormalities of mood noted, alert and oriented to situation  ED Course  Procedures  Pt with scattered mild wheezing No distress noted With recent cough/congestion/myalgias, suspect viral illness Will give albuterol for symptomatic relief I doubt pneumonia She is not septic appearing at this time We discussed strict return precautions  She has h/o HTN and is on medications  MDM   Final diagnoses:  Viral URI    Nursing notes including  past medical history and social history reviewed and considered in documentation     Ripley Fraise, MD 06/25/15 2212

## 2015-07-27 ENCOUNTER — Other Ambulatory Visit: Payer: Self-pay | Admitting: Physician Assistant

## 2015-08-01 ENCOUNTER — Encounter: Payer: Self-pay | Admitting: Family Medicine

## 2015-08-05 ENCOUNTER — Other Ambulatory Visit: Payer: Self-pay | Admitting: Physician Assistant

## 2015-08-06 ENCOUNTER — Encounter: Payer: Self-pay | Admitting: Family Medicine

## 2015-08-20 ENCOUNTER — Emergency Department (HOSPITAL_COMMUNITY): Payer: Self-pay

## 2015-08-20 ENCOUNTER — Encounter (HOSPITAL_COMMUNITY): Payer: Self-pay | Admitting: Family Medicine

## 2015-08-20 ENCOUNTER — Emergency Department (HOSPITAL_COMMUNITY)
Admission: EM | Admit: 2015-08-20 | Discharge: 2015-08-20 | Disposition: A | Discharge status: Home / Self Care | Payer: Self-pay | Attending: Emergency Medicine | Admitting: Emergency Medicine

## 2015-08-20 DIAGNOSIS — Z79899 Other long term (current) drug therapy: Secondary | ICD-10-CM | POA: Insufficient documentation

## 2015-08-20 DIAGNOSIS — I10 Essential (primary) hypertension: Secondary | ICD-10-CM | POA: Insufficient documentation

## 2015-08-20 DIAGNOSIS — E669 Obesity, unspecified: Secondary | ICD-10-CM | POA: Insufficient documentation

## 2015-08-20 DIAGNOSIS — Z862 Personal history of diseases of the blood and blood-forming organs and certain disorders involving the immune mechanism: Secondary | ICD-10-CM | POA: Insufficient documentation

## 2015-08-20 DIAGNOSIS — J069 Acute upper respiratory infection, unspecified: Secondary | ICD-10-CM | POA: Insufficient documentation

## 2015-08-20 LAB — RAPID STREP SCREEN (MED CTR MEBANE ONLY): STREPTOCOCCUS, GROUP A SCREEN (DIRECT): NEGATIVE

## 2015-08-20 MED ORDER — BENZONATATE 100 MG PO CAPS: 100.0000 mg | ORAL_CAPSULE | Freq: Three times a day (TID) | ORAL | Status: DC | PRN

## 2015-08-20 MED ORDER — IBUPROFEN 400 MG PO TABS
400.0000 mg | ORAL_TABLET | Freq: Once | ORAL | Status: AC
  Administered 2015-08-20: 400 mg via ORAL

## 2015-08-20 MED ORDER — IBUPROFEN 400 MG PO TABS
ORAL_TABLET | ORAL | Status: AC
  Filled 2015-08-20: qty 1

## 2015-08-20 MED ORDER — ACETAMINOPHEN 500 MG PO TABS
1000.0000 mg | ORAL_TABLET | Freq: Once | ORAL | Status: AC
  Administered 2015-08-20: 1000 mg via ORAL
  Filled 2015-08-20: qty 2

## 2015-08-20 NOTE — Progress Notes (Signed)
Spoke to patient regarding primary care resources and the [facility] orange card. Patient states her insurance will be active in the next month, and currently has a pcp. Resource guide and my contact information provided for any future questions or concerns. No other [place] Specialist needs identified at this time.   [place] [facility] for [facility] [phone]

## 2015-08-20 NOTE — Discharge Instructions (Signed)
°Emergency Department Resource Guide °1) Find a Doctor and Pay Out of Pocket °Although you won't have to find out who is covered by your insurance plan, it is a good idea to ask around and get recommendations. You will then need to call the office and see if the doctor you have chosen will accept you as a new patient and what types of options they offer for patients who are self-pay. Some doctors offer discounts or will set up payment plans for their patients who do not have insurance, but you will need to ask so you aren't surprised when you get to your appointment. ° °2) Contact Your Local Health Department °Not all health departments have doctors that can see patients for sick visits, but many do, so it is worth a call to see if yours does. If you don't know where your local health department is, you can check in your phone book. The CDC also has a tool to help you locate your state's health department, and many state websites also have listings of all of their local health departments. ° °3) Find a Walk-in Clinic °If your illness is not likely to be very severe or complicated, you may want to try a walk in clinic. These are popping up all over the country in pharmacies, drugstores, and shopping centers. They're usually staffed by nurse practitioners or physician assistants that have been trained to treat common illnesses and complaints. They're usually fairly quick and inexpensive. However, if you have serious medical issues or chronic medical problems, these are probably not your best option. ° °No Primary Care Doctor: °- Call Health Connect at  832-8000 - they can help you locate a primary care doctor that  accepts your insurance, provides certain services, etc. °- Physician Referral Service- 1-800-533-3463 ° °Chronic Pain Problems: °Organization         Address  Phone   Notes  °[facility]  ([phone]) [phone] Patients need to be referred by their primary care doctor.  ° °Medication  Assistance: °Organization         Address  Phone   Notes  °[place] [facility] [address] °[place], [place] [address] (336) 641-8030 --Must be a resident of [place] °-- Must have NO insurance coverage whatsoever (no Medicaid/ Medicare, etc.) °-- The pt. MUST have a primary care doctor that directs their care regularly and follows them in the community °  °[facility]  (866) 331-1348   °[employer]  (888) 892-1162   ° °Agencies that provide inexpensive medical care: °Organization         Address  Phone   Notes  °[facility]  (336) 832-8035   °[facility]    (336) 832-7272   °[facility] [address] °[place], [place] [address] (336) 832-4777   °[facility] [address], °[place] (336) 271-4999   °[facility]    (336) 373-0678   °[facility]    (336) 272-1050   °[facility] ° [address], [place] Phone:  (336) 832-4444, Fax:  (336) 832-4440 Hours of Operation:  9 am - 6 pm, M-F.  Also accepts Medicaid/Medicare and self-pay.  °[facility] ° [address], [place] Phone: (336) 832-3150, Fax: (336) [phone]. Hours of Operation:  8:30 am - 5:30 pm, M-F.  Also accepts Medicaid and self-pay.  °[facility] [place] [address], [place] Phone: (336) 878-6027   °[facility] [address], [place], [place] (336)723-1848, Ext. 123 Mondays & Thursdays: 7-9 AM.  First 15 patients are seen on a first come, first serve basis. °  ° °Medicaid-accepting [place] Providers: ° °Organization         Address  Phone   Notes  °[facility] [address], [place] (336) 641-2100 Also accepts self-pay patients.  °[facility] [address], [place] ° (336) 856-9996   °[facility] [address], [place]  (336) 288-8857   °[facility] [address] [place] [address], [place] (336) 299-7000   °Veita Bland [address], [place]  ° (336) 373-1557 Only accepts [place] Access Medicaid patients after they have their name applied to their card.  ° °Self-Pay (no insurance) in [place]: ° °Organization         Address  Phone   Notes  °Sickle Cell Patients, [facility] [address], [place] (336) 832-1970   °[facility] [address], [place] (336) 832-4400   °[facility] [place] ° [address], [place] (336) 992-4800   °[facility]/Dr. Osei-Bonsu ° [address] [place] [address], [place] or [address], [place] (336) [phone] Phone number for both [place] and [place] locations is the same.  °[facility] [address], [place] (336) 299-0000   °[facility] [address] [place] [address], [place] or [address] (336) 852-7530 °(336) 878-2260   °[facility] [address], [place] (336) 350-1642, phone; (336) 294-5005, fax Sees patients 1st and 3rd Saturday of every month.  Must not qualify for public or private insurance (i.e. Medicaid, Medicare, [place] Health Choice, Veterans' Benefits) • Household income should be no more than 200% of the poverty level •The clinic cannot treat you if you are pregnant or think you are pregnant • Sexually transmitted diseases are not treated at the clinic.  ° ° °Dental Care: °Organization         Address  Phone  Notes  °[place] [facility] [address],  (336) 641-6152 Accepts children up to age 21 who are enrolled in Medicaid or [place] Health Choice; pregnant women with a Medicaid card; and children who have applied for Medicaid or [place] Health Choice, but were declined, whose parents can pay a reduced fee at time of service.  °[place]  [facility] [place]  [address], [place] (336) 641-7733 Accepts children up to age 21 who are enrolled in Medicaid or [place] Health Choice; pregnant women with a Medicaid card; and children who have applied for Medicaid or [place] Health Choice, but were declined, whose parents can pay a reduced fee at time of service.  °[facility] ° [address],  (336) 641-4533 Patients are seen by appointment only. Walk-ins are not accepted. [facility] will see patients 18 years of age and older. °Monday - Tuesday (8am-5pm) °Most Wednesdays (8:30-5pm) °$30 per visit, cash only  °[facility] ° [address], [place] (336) 641-4533 Patients are seen by appointment only. Walk-ins are not accepted. [facility] will see patients 18 years of age and older. °One   Wednesday Evening (Monthly: Volunteer Based).  $30 per visit, cash only  °[facility]  (919) 537-3737 for adults; Children under age 4, call Graduate Pediatric Dentistry at (919) 537-3956. Children aged 4-14, please call (919) 537-3737 to request a pediatric application. ° Dental services are provided in all areas of dental care including fillings, crowns and bridges, complete and partial dentures, implants, gum treatment, root canals, and extractions. Preventive care is also provided. Treatment is provided to both adults and children. °Patients are selected via a lottery and there is often a waiting list. °  °[facility] [address], °[place] ° (336) 763-8833 www.drcivils.[email or web address] °  °[facility] [address], [place], [place] (336)723-1848, Ext. 123 Second and Fourth Thursday of each month, opens at 6:30 AM; Clinic ends at 9 AM.  Patients are seen on a first-come first-served basis, and a limited number are seen during each clinic.  ° °[facility] ° [address], [place], [place] (336) 723-7904    Eligibility Requirements °You must have lived in [place], [place], or [place] counties for at least the last three months. °  You cannot be eligible for state or federal sponsored healthcare insurance, including Veterans Administration, Medicaid, or Medicare. °  You generally cannot be eligible for healthcare insurance through your employer.  °  How to apply: °Eligibility screenings are held every Tuesday and Wednesday afternoon from 1:00 pm until 4:00 pm. You do not need an appointment for the interview!  °[facility] [address], [place], [place] 336-631-2330   °[facility]  336-342-8273   °[facility]  336-703-3100   °[facility]  336-570-6415   ° °Behavioral Health Resources in the Community: °Intensive Outpatient Programs °Organization         Address  Phone  Notes  °[facility] [address], [place], [place] 336-878-6098   °[facility] [address], [place], [place] 336-832-9800   °[facility] [address], [place], [place] ° 336-882-2125   °[facility] [address],  °[place], [place] 1-800-853-5163 or 336-641-4981   °Substance Abuse Resources °Organization         Address  Phone  Notes  °[facility]  336-882-2125   °[facility]  336-784-9470   °[facility]  336-285-9073   °[facility]  336-845-3988   °Residential & Outpatient Substance Abuse Program  1-800-659-3381   °Psychological Services °Organization         Address  Phone  Notes  °[facility]  336- 832-9600   °[facility]  336- 378-7881   °[facility] [address], [place] 1-800-853-5163 or 336-641-4981   ° °Mobile Crisis Teams °Organization         Address  Phone  Notes  °[facility], Mobile Crisis Care Unit  1-877-626-1772   °Assertive °[facility] ° [address]  [place], [place] 336-834-9664   °Sharon DeEsch [address] °[place] 336-554-5454   ° °Self-Help/Support Groups °Organization         Address  Phone             Notes  °[facility] [place] - variety of support groups  336- 373-1402 Call for more information  °Narcotics Anonymous (NA), [facility] [address], °[place]  2 meetings at this location  ° °  Residential Treatment Programs Organization         Address  Phone  Notes  [facility] [address]    [place]  1-941-754-3608   [facility]  [address] [place] [id number], [place], [place]   [employer] [place], [place] 563-122-6069 Admissions: 8am-3pm M-F  [facility] [place] [address]    [place], [place] X4321937   [facility] [address] [place], [place], [place]   [facility] [address],  [place], [place]   [facility] - Intensive Outpatient [address], [place], [place]   [facility] [place]  [place], [place] 1-(936)693-9744 or 671-402-0265   [facility] [address] [place], [place] Accepts Medicaid  [facility] [place] [address],  [place] 1-234-241-3019 Substance Abuse/Addiction Treatment   [facility]         Address  Phone  Notes  [facility]  785-846-0008   Domenic Schwab, PhD [address] [place], [place]   714-835-8011 or (270)346-2478   [address] [place], [place] (484) 102-9646   [facility] [address], [place], [place] 913-729-9804 Insurance/Medicaid/sponsorship through [facility] and Families [address]                                    [place], [place] (437)006-6988 [place] [address], [place] (450)[phone]    Dr. Adele Schilder  205-331-5995   [facility] 1) [address], [place] 2) [place] 3)  [address], [place] 712-274-9568 [phone]  8706056499   [place] [phone] or 413-675-1269 (After Hours)     Take over the counter tylenol and ibuprofen, as directed on packaging, as needed for discomfort.  Gargle with warm water several times per day to help with discomfort.  May also use over the counter sore throat pain medicines such as chloraseptic or sucrets, as directed on packaging, as needed for discomfort. Take over the counter decongestant (formulation for those with high blood pressure), as directed on packaging, for the next week.  Use over the counter normal saline nasal spray, as instructed in the Emergency Department, several times per day for the next 2 weeks.  Call your regular medical doctor today to schedule a follow up appointment in the next 2 days.  Return to the Emergency Department immediately if worsening.

## 2015-08-20 NOTE — ED Provider Notes (Signed)
CSN: [id number]     Arrival date & time 08/20/15  [id number] History   First MD Initiated Contact with Patient 08/20/15 0932     Chief Complaint  Patient presents with  . Chills  . Generalized Body Aches  . Fever      HPI Pt was seen at 0935.  Per pt, c/o gradual onset and persistence of constant sore throat, runny/stuffy nose, sinus congestion, and cough for the past 2-3 days. Has been associated with home fevers/chills and generalized body aches/fatigue. +others in household with similar symptoms. Denies rash, no CP/SOB, no N/V/D, no abd pain.     Past Medical History  Diagnosis Date  . Hypertension   . Obesity   . Anemia   . Leukocytosis, unspecified 10/18/2013   Past Surgical History  Procedure Laterality Date  . Tubal ligation  2001   Family History  Problem Relation Age of Onset  . Hypertension Mother   . Cancer Mother   . Hypertension Father   . Stroke Father   . Diabetes Sister   . Hypertension Brother   . Cancer Maternal Grandmother    Social History  Substance Use Topics  . Smoking status: Never Smoker   . Smokeless tobacco: Never Used     Comment: never used tobacco  . Alcohol Use: No    Review of Systems ROS: Statement: All systems negative except as marked or noted in the HPI; Constitutional: +fever and chills, generalized body aches/fatigue.. ; ; Eyes: Negative for eye pain, redness and discharge. ; ; ENMT: Negative for ear pain, hoarseness, +nasal congestion, sinus pressure and sore throat. ; ; Cardiovascular: Negative for chest pain, palpitations, diaphoresis, dyspnea and peripheral edema. ; ; Respiratory: Negative for cough, wheezing and stridor. ; ; Gastrointestinal: Negative for nausea, vomiting, diarrhea, abdominal pain, blood in stool, hematemesis, jaundice and rectal bleeding. . ; ; Genitourinary: Negative for dysuria, flank pain and hematuria. ; ; Musculoskeletal: Negative for back pain and neck pain. Negative for swelling and trauma.; ; Skin: Negative for  pruritus, rash, abrasions, blisters, bruising and skin lesion.; ; Neuro: Negative for headache, lightheadedness and neck stiffness. Negative for weakness, altered level of consciousness , altered mental status, extremity weakness, paresthesias, involuntary movement, seizure and syncope.      Allergies  Lisinopril-hydrochlorothiazide  Home Medications   Prior to Admission medications   Medication Sig Start Date End Date Taking? Authorizing Provider  albuterol (PROVENTIL HFA;VENTOLIN HFA) 108 (90 BASE) MCG/ACT inhaler Inhale 2 puffs into the lungs every 6 (six) hours as needed for wheezing or shortness of breath.    Historical Provider, MD  amLODipine (NORVASC) 10 MG tablet TAKE 1 TABLET (10 MG TOTAL) BY MOUTH DAILY. 11/21/14   Chelle Jeffery, PA-C  ipratropium (ATROVENT) 0.06 % nasal spray Place 2 sprays into both nostrils 4 (four) times daily. For nasal congestion Patient not taking: Reported on 08/20/2015 11/26/14   Audelia Hives Presson, PA   BP 149/72 mmHg  Pulse 123  Temp(Src) 103 F (39.4 C) (Oral)  Resp 34  SpO2 97%  LMP 08/13/2015  BP 124/71 mmHg  Pulse 111  Temp(Src) 100.3 F (37.9 C) (Oral)  Resp 16  SpO2 97%  LMP 08/13/2015  Physical Exam  0940: Physical examination:  Nursing notes reviewed; Vital signs and O2 SAT reviewed; +febrile.;; Constitutional: Well developed, Well nourished, Well hydrated, In no acute distress; Head:  Normocephalic, atraumatic; Eyes: EOMI, PERRL, No scleral icterus; ENMT: TM's clear bilat. +edemetous nasal turbinates bilat with clear rhinorrhea. Mouth and  pharynx without lesions. No tonsillar exudates. No intra-oral edema. No submandibular or sublingual edema. No hoarse voice, no drooling, no stridor. No pain with manipulation of larynx. No trismus. Mouth and pharynx normal, Mucous membranes moist; Neck: Supple, Full range of motion, No lymphadenopathy; Cardiovascular: Tachycardic rate and rhythm, No murmur, rub, or gallop; Respiratory: Breath sounds  clear & equal bilaterally, No rales, rhonchi, wheezes.  Speaking full sentences with ease, Normal respiratory effort/excursion; Chest: Nontender, Movement normal; Abdomen: Soft, Nontender, Nondistended, Normal bowel sounds; Genitourinary: No CVA tenderness; Extremities: Pulses normal, No tenderness, No edema, No calf edema or asymmetry.; Neuro: AA&Ox3, Major CN grossly intact.  Speech clear. No gross focal motor or sensory deficits in extremities.; Skin: Color normal, Warm, Dry.   ED Course  Procedures (including critical care time) Labs Review  Imaging Review  I have personally reviewed and evaluated these images and lab results as part of my medical decision-making.   EKG Interpretation None      MDM  MDM Reviewed: previous chart, nursing note and vitals Interpretation: labs and x-ray      Results for orders placed or performed during the hospital encounter of 08/20/15  Rapid strep screen (not at [facility])  Result Value Ref Range   Streptococcus, Group A Screen (Direct) NEGATIVE NEGATIVE   Dg Chest 2 View 08/20/2015  CLINICAL DATA:  Shortness of breath, cough, fever for 1 day. EXAM: CHEST  2 VIEW COMPARISON:  12/19/2011 FINDINGS: Cardiomediastinal silhouette is normal. Mediastinal contours appear intact. There is no evidence of focal airspace consolidation, pleural effusion or pneumothorax. Osseous structures are without acute abnormality. Soft tissues are grossly normal. IMPRESSION: No active cardiopulmonary disease. Electronically Signed   By: Fidela Salisbury M.D.   On: 08/20/2015 09:02    1120:  Fever improving after motrin/APAP. Has tol PO well while in the ED without N/V. Abd remains benign, resps easy. Workup reassuring. Tx symptomatically at this time. Dx and testing d/w pt.  Questions answered.  Verb understanding, agreeable to d/c home with outpt f/u.   Francine Graven, DO 08/24/15 2119

## 2015-08-20 NOTE — ED Notes (Addendum)
Pt hre for chills, body aches and fever that started yesterday. Denies N,V,D. sts she last took tylenol this am about 5. 2 extra strength

## 2015-08-22 LAB — CULTURE, GROUP A STREP (THRC)

## 2015-09-09 ENCOUNTER — Ambulatory Visit (INDEPENDENT_AMBULATORY_CARE_PROVIDER_SITE_OTHER): Payer: BLUE CROSS/BLUE SHIELD | Admitting: Internal Medicine

## 2015-09-09 VITALS — BP 158/100 | HR 94 | Temp 99.1°F | Resp 18 | Ht 64.0 in | Wt 280.0 lb

## 2015-09-09 DIAGNOSIS — J988 Other specified respiratory disorders: Secondary | ICD-10-CM

## 2015-09-09 DIAGNOSIS — J22 Unspecified acute lower respiratory infection: Secondary | ICD-10-CM

## 2015-09-09 DIAGNOSIS — J4520 Mild intermittent asthma, uncomplicated: Secondary | ICD-10-CM | POA: Diagnosis not present

## 2015-09-09 MED ORDER — HYDROCODONE-HOMATROPINE 5-1.5 MG/5ML PO SYRP: 5.0000 mL | ORAL_SOLUTION | Freq: Four times a day (QID) | ORAL | Status: DC | PRN

## 2015-09-09 MED ORDER — AMLODIPINE BESYLATE 10 MG PO TABS: ORAL_TABLET | ORAL | Status: DC

## 2015-09-09 MED ORDER — PREDNISONE 20 MG PO TABS: ORAL_TABLET | ORAL | Status: DC

## 2015-09-09 MED ORDER — AZITHROMYCIN 250 MG PO TABS: ORAL_TABLET | ORAL | Status: DC

## 2015-09-09 NOTE — Progress Notes (Signed)
   Subjective:    Patient ID: Christine Oneal, female    DOB: [birth year], 45 y.o.   MRN: [id number]  HPI 2 w ago feevr chills body aches and sob To ER---dx viral Out 3 d then worked But still sxt last 10d lingering cough  No hx asthma but hx RAD w infec  Review of Systems Noncontributory    Objective:   Physical Exam BP 158/100 mmHg  Pulse 94  Temp(Src) 99.1 F (37.3 C) (Oral)  Resp 18  Ht 5\' 4"  (1.626 m)  Wt 280 lb (127.007 kg)  BMI 48.04 kg/m2  SpO2 99%  LMP 08/13/2015 No acute distress HEENT clear No cervical adenopathy Lungs with mild wheezing on forced expiration Dullness at the right base Rhonchi that clear with coughing Heart regular without murmur Extremities without edema Skin no rashes       Assessment & Plan:  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 azithromycin (ZITHROMAX) 250 MG tablet    Sig: As packaged    Dispense:  6 tablet    Refill:  0  . predniSONE (DELTASONE) 20 MG tablet    Sig: 3/3/2/2/1/1 single daily dose for 6 days    Dispense:  12 tablet    Refill:  0  . HYDROcodone-homatropine (HYCODAN) 5-1.5 MG/5ML syrup    Sig: Take 5 mLs by mouth every 6 (six) hours as needed.    Dispense:  120 mL    Refill:  0   she has a Ventolin inhaler if needed Restart blood pressure medication: Amlodipine 10 mg Check home blood pressures and follow-up if not controlled

## 2015-10-14 ENCOUNTER — Ambulatory Visit (INDEPENDENT_AMBULATORY_CARE_PROVIDER_SITE_OTHER): Payer: BLUE CROSS/BLUE SHIELD | Admitting: Physician Assistant

## 2015-10-14 VITALS — BP 148/92 | HR 100 | Temp 99.5°F | Resp 18 | Ht 64.0 in | Wt 282.8 lb

## 2015-10-14 DIAGNOSIS — I10 Essential (primary) hypertension: Secondary | ICD-10-CM | POA: Diagnosis not present

## 2015-10-14 DIAGNOSIS — R197 Diarrhea, unspecified: Secondary | ICD-10-CM

## 2015-10-14 DIAGNOSIS — R110 Nausea: Secondary | ICD-10-CM | POA: Diagnosis not present

## 2015-10-14 DIAGNOSIS — A09 Infectious gastroenteritis and colitis, unspecified: Secondary | ICD-10-CM | POA: Diagnosis not present

## 2015-10-14 MED ORDER — METOPROLOL SUCCINATE ER 25 MG PO TB24: 25.0000 mg | ORAL_TABLET | Freq: Every day | ORAL | Status: DC

## 2015-10-14 MED ORDER — ONDANSETRON 4 MG PO TBDP: 4.0000 mg | ORAL_TABLET | Freq: Three times a day (TID) | ORAL | Status: DC | PRN

## 2015-10-14 NOTE — Patient Instructions (Addendum)
Focus on hydration - at least 64 oz fluids a day. Eat bland diet. Take zofran as needed for nausea. If your symptoms are not improving in 3 days, let me know and you can come pick up a stool collection kit  When you are feeling better start on metoprolol 25 mg once a day with your amlodipine.  Return in 4-6 weeks for follow up    IF you received an x-ray today, you will receive an invoice from [facility]. Please contact [facility] at [phone] with questions or concerns regarding your invoice.   IF you received labwork today, you will receive an invoice from [employer]. Please contact [facility] at [phone] with questions or concerns regarding your invoice.   Our billing staff will not be able to assist you with questions regarding bills from these companies.  You will be contacted with the lab results as soon as they are available. The fastest way to get your results is to activate your My Chart account. Instructions are located on the last page of this paperwork. If you have not heard from [place] regarding the results in 2 weeks, please contact this office.     b

## 2015-10-14 NOTE — Progress Notes (Signed)
[facility] [address] [place] [address] [phone]  Date:  10/14/2015   Name:  Christine Oneal   DOB:  [birth year]   MRN:  [id number]  PCP:  Lamar Blinks, MD    Chief Complaint: Abdominal Cramping; back aching; and Diarrhea   History of Present Illness:  This is a 45 y.o. female with PMH HTN who is presenting with 2 days of diarrhea, abdominal cramping and back ache. She has been having diarrhea every hour for the past 24 hours. She states it seems to be slowing down some now. Stool consistency is watery. There is no blood in her stool. Feeling nauseated at times but has not vomited. Having abdominal cramping generally throughout abdomen. Back pain coming and going.  Having a headache but no dizziness or presyncope. No dysuria or urinary frequency.  No URI sx. Been drinking ginger ale mostly, some water, powerade. Kids with similar symptoms, getting better for them now.  Pt takes amlodipine 10 mg daily for HTN. Looking back at readings through 2015, BP generally with SBP>140 and DBP ranges 69 - 103. A few years back she was started on lisinopril-hctz combo and had anaphylactic reaction and was seen in ED. Pulse recordings over the past 2 years generally >90.  Review of Systems:  Review of Systems See HPI  Patient Active Problem List   Diagnosis Date Noted  . Leukocytosis, unspecified 10/18/2013  . HYPERTENSION 10/24/2007  . EXTERNAL HEMORRHOIDS 10/24/2007  . ANAL FISSURE 10/24/2007    Prior to Admission medications   Medication Sig Start Date End Date Taking? Authorizing Provider  albuterol (PROVENTIL HFA;VENTOLIN HFA) 108 (90 BASE) MCG/ACT inhaler Inhale 2 puffs into the lungs every 6 (six) hours as needed for wheezing or shortness of breath.   Yes Historical Provider, MD  amLODipine (NORVASC) 10 MG tablet TAKE 1 TABLET (10 MG TOTAL) BY MOUTH DAILY. 09/09/15  Yes Leandrew Koyanagi, MD    Allergies  Allergen Reactions  .  Lisinopril-Hydrochlorothiazide Anaphylaxis    Past Surgical History  Procedure Laterality Date  . Tubal ligation  2001    Social History  Substance Use Topics  . Smoking status: Never Smoker   . Smokeless tobacco: Never Used     Comment: never used tobacco  . Alcohol Use: No    Family History  Problem Relation Age of Onset  . Hypertension Mother   . Cancer Mother   . Hypertension Father   . Stroke Father   . Diabetes Sister   . Hypertension Brother   . Cancer Maternal Grandmother     Medication list has been reviewed and updated.  Physical Examination:  Physical Exam  Constitutional: She is oriented to person, place, and time. She appears well-developed and well-nourished. No distress.  HENT:  Head: Normocephalic and atraumatic.  Right Ear: Hearing normal.  Left Ear: Hearing normal.  Nose: Nose normal.  Eyes: Conjunctivae and lids are normal. Right eye exhibits no discharge. Left eye exhibits no discharge. No scleral icterus.  Cardiovascular: Normal rate, regular rhythm, normal heart sounds and normal pulses.   No murmur heard. Pulmonary/Chest: Effort normal and breath sounds normal. No respiratory distress. She has no wheezes. She has no rhonchi. She has no rales.  Abdominal: Soft. Bowel sounds are normal. There is no tenderness. There is no CVA tenderness.  obese  Musculoskeletal: Normal range of motion.  Lymphadenopathy:       Head (right side): No submental, no submandibular and no tonsillar adenopathy present.  Head (left side): No submental, no submandibular and no tonsillar adenopathy present.    She has no cervical adenopathy.  Neurological: She is alert and oriented to person, place, and time.  Skin: Skin is warm, dry and intact. No lesion and no rash noted.  Psychiatric: She has a normal mood and affect. Her speech is normal and behavior is normal. Thought content normal.   BP 148/92 mmHg  Pulse 100  Temp(Src) 99.5 F (37.5 C) (Oral)  Resp 18   Ht _0  (1.626 m)  Wt 282 lb 12.8 oz (128.277 kg)  BMI 48.52 kg/m2  SpO2 99%  LMP 10/04/2015  Assessment and Plan:  1. Nausea without vomiting 2. Diarrhea of presumed infectious origin Likely viral gastroenteritis. Kids with same symptoms that are getting better now. Her diarrhea seems to be slowing down now, after 24 hours of illness so far. We discussed bland diet and hydration. zofran prn nausea. If symptoms not improving in 3 days, call and let me know and I will order stool collection kit for her to come pick up. - ondansetron (ZOFRAN ODT) 4 MG disintegrating tablet; Take 1 tablet (4 mg total) by mouth every 8 (eight) hours as needed for nausea or vomiting.  Dispense: 20 tablet; Refill: 0  3. Essential hypertension BP not controlled on amlodipine 10 mg. She has been tried on combo pill lisinopril-hctz but had anaphylactic reaction. Will add metoprolol 25 mg XR QD for added control. This is a good choice for her since pulse generally in 90s. Return in 4-6 weeks for follow up. - metoprolol succinate (TOPROL-XL) 25 MG 24 hr tablet; Take 1 tablet (25 mg total) by mouth daily.  Dispense: 30 tablet; Refill: 5   Nicole V. Drenda Freeze, MHS [facility]  10/20/2015

## 2015-11-20 ENCOUNTER — Telehealth: Payer: Self-pay

## 2015-11-20 ENCOUNTER — Ambulatory Visit (INDEPENDENT_AMBULATORY_CARE_PROVIDER_SITE_OTHER): Payer: BLUE CROSS/BLUE SHIELD | Admitting: Family Medicine

## 2015-11-20 VITALS — BP 126/106 | HR 94 | Temp 98.3°F | Resp 16 | Ht 63.0 in | Wt 289.4 lb

## 2015-11-20 DIAGNOSIS — B029 Zoster without complications: Secondary | ICD-10-CM

## 2015-11-20 MED ORDER — HYDROXYZINE HCL 25 MG PO TABS: 25.0000 mg | ORAL_TABLET | Freq: Three times a day (TID) | ORAL | Status: DC | PRN

## 2015-11-20 MED ORDER — ATENOLOL 50 MG PO TABS: 50.0000 mg | ORAL_TABLET | Freq: Every day | ORAL | Status: DC

## 2015-11-20 MED ORDER — VALACYCLOVIR HCL 1 G PO TABS: 1000.0000 mg | ORAL_TABLET | Freq: Three times a day (TID) | ORAL | Status: DC

## 2015-11-20 MED ORDER — ACYCLOVIR 400 MG PO TABS: 800.0000 mg | ORAL_TABLET | Freq: Every day | ORAL | Status: DC

## 2015-11-20 NOTE — Patient Instructions (Addendum)
IF you received an x-ray today, you will receive an invoice from [facility]. Please contact [facility] at [phone] with questions or concerns regarding your invoice.   IF you received labwork today, you will receive an invoice from [employer]. Please contact [facility] at [phone] with questions or concerns regarding your invoice.   Our billing staff will not be able to assist you with questions regarding bills from these companies.  You will be contacted with the lab results as soon as they are available. The fastest way to get your results is to activate your My Chart account. Instructions are located on the last page of this paperwork. If you have not heard from [place] regarding the results in 2 weeks, please contact this office.    Shingles Shingles, which is also known as herpes zoster, is an infection that causes a painful skin rash and fluid-filled blisters. Shingles is not related to genital herpes, which is a sexually transmitted infection.   Shingles only develops in people who:  Have had chickenpox.  Have received the chickenpox vaccine. (This is rare.) CAUSES Shingles is caused by varicella-zoster virus (VZV). This is the same virus that causes chickenpox. After exposure to VZV, the virus stays in the body in an inactive (dormant) state. Shingles develops if the virus reactivates. This can happen many years after the initial exposure to VZV. It is not known what causes this virus to reactivate. RISK FACTORS People who have had chickenpox or received the chickenpox vaccine are at risk for shingles. Infection is more common in people who:  Are older than age 57.  Have a weakened defense (immune) system, such as those with HIV, AIDS, or cancer.  Are taking medicines that weaken the immune system, such as transplant medicines.  Are under great stress. SYMPTOMS Early symptoms of this condition include itching, tingling, and  pain in an area on your skin. Pain may be described as burning, stabbing, or throbbing. A few days or weeks after symptoms start, a painful red rash appears, usually on one side of the body in a bandlike or beltlike pattern. The rash eventually turns into fluid-filled blisters that break open, scab over, and dry up in about 2-3 weeks. At any time during the infection, you may also develop:  A fever.  Chills.  A headache.  An upset stomach. DIAGNOSIS This condition is diagnosed with a skin exam. Sometimes, skin or fluid samples are taken from the blisters before a diagnosis is made. These samples are examined under a microscope or sent to a lab for testing. TREATMENT There is no specific cure for this condition. Your health care provider will probably prescribe medicines to help you manage pain, recover more quickly, and avoid long-term problems. Medicines may include:  Antiviral drugs.  Anti-inflammatory drugs.  Pain medicines. If the area involved is on your face, you may be referred to a specialist, such as an eye doctor (ophthalmologist) or an ear, nose, and throat (ENT) doctor to help you avoid eye problems, chronic pain, or disability. HOME CARE INSTRUCTIONS Medicines  Take medicines only as directed by your health care provider.  Apply an anti-itch or numbing cream to the affected area as directed by your health care provider. Blister and Rash Care  Take a cool bath or apply cool compresses to the area of the rash or blisters as directed by your health care provider. This may help with pain and itching.  Keep your rash covered with a  loose bandage (dressing). Wear loose-fitting clothing to help ease the pain of material rubbing against the rash.  Keep your rash and blisters clean with mild soap and cool water or as directed by your health care provider.  Check your rash every day for signs of infection. These include redness, swelling, and pain that lasts or increases.  Do  not pick your blisters.  Do not scratch your rash. General Instructions  Rest as directed by your health care provider.  Keep all follow-up visits as directed by your health care provider. This is important.  Until your blisters scab over, your infection can cause chickenpox in people who have never had it or been vaccinated against it. To prevent this from happening, avoid contact with other people, especially:  Babies.  Pregnant women.  Children who have eczema.  Elderly people who have transplants.  People who have chronic illnesses, such as leukemia or AIDS. SEEK MEDICAL CARE IF:  Your pain is not relieved with prescribed medicines.  Your pain does not get better after the rash heals.  Your rash looks infected. Signs of infection include redness, swelling, and pain that lasts or increases. SEEK IMMEDIATE MEDICAL CARE IF:  The rash is on your face or nose.  You have facial pain, pain around your eye area, or loss of feeling on one side of your face.  You have ear pain or you have ringing in your ear.  You have loss of taste.  Your condition gets worse.   This information is not intended to replace advice given to you by your health care provider. Make sure you discuss any questions you have with your health care provider.   Document Released: 06/28/2005 Document Revised: 07/19/2014 Document Reviewed: 05/09/2014 Elsevier Interactive Patient Education [employer].

## 2015-11-20 NOTE — Progress Notes (Signed)
   Subjective:    Patient ID: Christine Oneal, female    DOB: September 16, 1970, 45 y.o.   MRN: [id number] Chief Complaint  Patient presents with  . bump on back    mid back "itchy" x 3 days    HPI  Pt takes amlodipine 10 mg daily for HTN. Looking back at readings through 2015, BP generally with SBP>140 and DBP ranges 69 - 103. A few years back she was started on lisinopril-hctz combo and had anaphylactic reaction and was seen in ED. Pulse recordings over the past 2 years generally >90.     Review of Systems  Constitutional: Negative for fever, chills and diaphoresis.  Musculoskeletal: Positive for myalgias and back pain. Negative for joint swelling, arthralgias, gait problem, neck pain and neck stiffness.  Skin: Positive for color change and rash. Negative for pallor and wound.  Hematological: Negative for adenopathy. Does not bruise/bleed easily.  Psychiatric/Behavioral: Positive for sleep disturbance.       Objective:  BP 126/106 mmHg  Pulse 94  Temp(Src) 98.3 F (36.8 C) (Oral)  Resp 16  Ht 5\' 3"  (1.6 m)  Wt 289 lb 6.4 oz (131.271 kg)  BMI 51.28 kg/m2  SpO2 98%  LMP 11/07/2015  Physical Exam  Constitutional: She is oriented to person, place, and time. She appears well-developed and well-nourished. No distress.  HENT:  Head: Normocephalic and atraumatic.  Right Ear: External ear normal.  Left Ear: External ear normal.  Eyes: Conjunctivae are normal. No scleral icterus.  Neck: Normal range of motion. Neck supple. No thyromegaly present.  Cardiovascular: Normal rate, regular rhythm, normal heart sounds and intact distal pulses.   Pulmonary/Chest: Effort normal and breath sounds normal. No respiratory distress.  Musculoskeletal: She exhibits no edema.  Lymphadenopathy:    She has no cervical adenopathy.  Neurological: She is alert and oriented to person, place, and time.  Skin: Skin is warm and dry. Rash noted. Rash is maculopapular and vesicular. She is not diaphoretic.       Psychiatric: She has a normal mood and affect. Her behavior is normal.          Assessment & Plan:   1. Shingles     Meds ordered this encounter  Medications  . atenolol (TENORMIN) 50 MG tablet    Sig: Take 1 tablet (50 mg total) by mouth daily.    Dispense:  90 tablet    Refill:  0  . DISCONTD: valACYclovir (VALTREX) 1000 MG tablet    Sig: Take 1 tablet (1,000 mg total) by mouth 3 (three) times daily.    Dispense:  21 tablet    Refill:  0  . hydrOXYzine (ATARAX/VISTARIL) 25 MG tablet    Sig: Take 1 tablet (25 mg total) by mouth 3 (three) times daily as needed.    Dispense:  60 tablet    Refill:  0  . acyclovir (ZOVIRAX) 400 MG tablet    Sig: Take 2 tablets (800 mg total) by mouth 5 (five) times daily. x10 days    Dispense:  100 tablet    Refill:  0      Delman Cheadle, M.D.  [facility] [address] [place], [place] [address] [phone] phone [phone] fax  12/10/2015 3:31 PM

## 2015-11-20 NOTE — Telephone Encounter (Signed)
Pt called requesting note for work. Spoke to Philis Fendt, PA-C who advised pt be out of work until vesicles are healed.

## 2016-02-10 ENCOUNTER — Other Ambulatory Visit: Payer: Self-pay

## 2016-02-10 MED ORDER — AMLODIPINE BESYLATE 10 MG PO TABS: ORAL_TABLET | ORAL | 0 refills | Status: DC

## 2016-02-15 ENCOUNTER — Other Ambulatory Visit: Payer: Self-pay | Admitting: Family Medicine

## 2016-04-20 ENCOUNTER — Encounter (HOSPITAL_COMMUNITY): Payer: Self-pay | Admitting: Emergency Medicine

## 2016-04-20 ENCOUNTER — Emergency Department (HOSPITAL_COMMUNITY): Payer: Self-pay

## 2016-04-20 ENCOUNTER — Emergency Department (HOSPITAL_COMMUNITY)
Admission: EM | Admit: 2016-04-20 | Discharge: 2016-04-20 | Disposition: A | Discharge status: Home / Self Care | Payer: Self-pay | Attending: Emergency Medicine | Admitting: Emergency Medicine

## 2016-04-20 DIAGNOSIS — S8391XA Sprain of unspecified site of right knee, initial encounter: Secondary | ICD-10-CM | POA: Insufficient documentation

## 2016-04-20 DIAGNOSIS — Y939 Activity, unspecified: Secondary | ICD-10-CM | POA: Insufficient documentation

## 2016-04-20 DIAGNOSIS — I10 Essential (primary) hypertension: Secondary | ICD-10-CM | POA: Insufficient documentation

## 2016-04-20 DIAGNOSIS — X501XXA Overexertion from prolonged static or awkward postures, initial encounter: Secondary | ICD-10-CM | POA: Insufficient documentation

## 2016-04-20 DIAGNOSIS — Y929 Unspecified place or not applicable: Secondary | ICD-10-CM | POA: Insufficient documentation

## 2016-04-20 DIAGNOSIS — Y999 Unspecified external cause status: Secondary | ICD-10-CM | POA: Insufficient documentation

## 2016-04-20 DIAGNOSIS — Z79899 Other long term (current) drug therapy: Secondary | ICD-10-CM | POA: Insufficient documentation

## 2016-04-20 MED ORDER — DICLOFENAC SODIUM 50 MG PO TBEC: 50.0000 mg | DELAYED_RELEASE_TABLET | Freq: Two times a day (BID) | ORAL | 0 refills | Status: DC

## 2016-04-20 MED ORDER — TRAMADOL HCL 50 MG PO TABS: 50.0000 mg | ORAL_TABLET | Freq: Four times a day (QID) | ORAL | 0 refills | Status: DC | PRN

## 2016-04-20 NOTE — ED Triage Notes (Signed)
Pt sts right knee pain after twisting on Saturday

## 2016-04-20 NOTE — ED Notes (Signed)
Pt st's she started to fall and caught herself.  Now c/o pain in right knee and right lower leg.  Incident occurred 4 days ago

## 2016-04-20 NOTE — ED Provider Notes (Signed)
[facility] DEPT Provider Note   CSN: [id number] Arrival date & time: 04/20/16  1757  By signing my name below, I, Christine Oneal, attest that this documentation has been prepared under the direction and in the presence of [facility] NP.  Electronically Signed: Ephriam Oneal, ED Scribe. 04/20/16. 6:39 PM.  History   Chief Complaint Chief Complaint  Patient presents with  . Knee Pain    HPI HPI Comments: Christine Oneal is a 45 y.o. female who presents to the Emergency Department complaining of constant right knee pain, onset three days ago. Pt was putting on a shoe and lost her balance which caused her knee to twist and reports pain ever since. She reports exacerbating pain when ambulating and bearing weight on her right lower extremity. She has taken ibuprofen and has attempted to keep the extremity elevated when she can but reports no significant relief of pain. Sensation intact. Denies any calf pain.  The history is provided by the patient. No language interpreter was used.    Past Medical History:  Diagnosis Date  . Anemia   . Hypertension   . Leukocytosis, unspecified 10/18/2013  . Obesity     Patient Active Problem List   Diagnosis Date Noted  . Leukocytosis, unspecified 10/18/2013  . HYPERTENSION 10/24/2007  . EXTERNAL HEMORRHOIDS 10/24/2007  . ANAL FISSURE 10/24/2007    Past Surgical History:  Procedure Laterality Date  . TUBAL LIGATION  2001    OB History    No data available       Home Medications    Prior to Admission medications   Medication Sig Start Date End Date Taking? Authorizing Provider  acyclovir (ZOVIRAX) 400 MG tablet Take 2 tablets (800 mg total) by mouth 5 (five) times daily. x10 days 11/20/15   Shawnee Knapp, MD  albuterol (PROVENTIL HFA;VENTOLIN HFA) 108 (90 BASE) MCG/ACT inhaler Inhale 2 puffs into the lungs every 6 (six) hours as needed for wheezing or shortness of breath.    Historical Provider, MD  amLODipine (NORVASC) 10 MG tablet TAKE  1 TABLET (10 MG TOTAL) BY MOUTH DAILY. 02/10/16   Dorian Heckle English, PA  atenolol (TENORMIN) 50 MG tablet TAKE 1 TABLET (50 MG TOTAL) BY MOUTH DAILY. 02/16/16   Dorian Heckle English, PA  diclofenac (VOLTAREN) 50 MG EC tablet Take 1 tablet (50 mg total) by mouth 2 (two) times daily. 04/20/16   Hope Bunnie Pion, NP  hydrOXYzine (ATARAX/VISTARIL) 25 MG tablet Take 1 tablet (25 mg total) by mouth 3 (three) times daily as needed. 11/20/15   Shawnee Knapp, MD  ondansetron (ZOFRAN ODT) 4 MG disintegrating tablet Take 1 tablet (4 mg total) by mouth every 8 (eight) hours as needed for nausea or vomiting. 10/14/15   Ezekiel Slocumb, PA-C  traMADol (ULTRAM) 50 MG tablet Take 1 tablet (50 mg total) by mouth every 6 (six) hours as needed. 04/20/16   Hope Bunnie Pion, NP    Family History Family History  Problem Relation Age of Onset  . Hypertension Mother   . Cancer Mother   . Hypertension Father   . Stroke Father   . Diabetes Sister   . Hypertension Brother   . Cancer Maternal Grandmother     Social History Social History  Substance Use Topics  . Smoking status: Never Smoker  . Smokeless tobacco: Never Used     Comment: never used tobacco  . Alcohol use No     Allergies   Lisinopril-hydrochlorothiazide   Review of  Systems Review of Systems  Musculoskeletal: Positive for arthralgias (right knee pain).  Neurological: Negative for numbness.  All other systems reviewed and are negative.    Physical Exam Updated Vital Signs BP 130/89   Pulse 91   Temp 98.5 F (36.9 C) (Oral)   Resp 19   SpO2 100%   Physical Exam  Constitutional: She is oriented to person, place, and time. She appears well-developed and well-nourished. No distress.  HENT:  Head: Normocephalic and atraumatic.  Neck: Normal range of motion.  Pulmonary/Chest: Effort normal.  Musculoskeletal:       Right knee: She exhibits swelling. She exhibits no deformity, normal alignment and normal patellar mobility. Tenderness found. MCL and  LCL tenderness noted.       Legs: Pedal pulses 2+, adequate circulation. Pain with flexion and extension of the knee.  Neurological: She is alert and oriented to person, place, and time.  Skin: Skin is warm and dry. She is not diaphoretic.  Psychiatric: She has a normal mood and affect. Judgment normal.  Nursing note and vitals reviewed.    ED Treatments / Results  DIAGNOSTIC STUDIES: Oxygen Saturation is 99% on RA, normal by my interpretation.  COORDINATION OF CARE: 6:38 PM-Will order imaging. Discussed treatment plan with pt at bedside and pt agreed to plan.   Labs (all labs ordered are listed, but only abnormal results are displayed) Labs Reviewed - No data to display  Radiology Dg Knee Complete 4 Views Right  Result Date: 04/20/2016 CLINICAL DATA:  Constant right knee pain twisting injury EXAM: RIGHT KNEE - COMPLETE 4+ VIEW COMPARISON:  None. FINDINGS: No acute fracture or dislocation. Mild narrowing of the medial compartment with bony spurring present. Small suprapatellar joint effusion. Minimal superior patellar spurring. Diffuse soft tissue edema. IMPRESSION: 1. No acute osseous abnormality 2. Small suprapatellar joint effusion Electronically Signed   By: Donavan Foil M.D.   On: 04/20/2016 19:28    Procedures Procedures (including critical care time)  Medications Ordered in ED Medications - No data to display   Initial Impression / Assessment and Plan / ED Course  I have reviewed the triage vital signs and the nursing notes.  Pertinent imaging results that were available during my care of the patient were reviewed by me and considered in my medical decision making (see chart for details).  Clinical Course    Final Clinical Impressions(s) / ED Diagnoses  45 y.o. female with right knee pain and swelling s/p injury 4 days ago stable for d/c without focal neuro deficits. Knee brace applied and patient to f/u with her PCP or orthopedic doctor if symptoms persist.  Discussed with the patient and all questioned fully answered. Final diagnoses:  Sprain of right knee, unspecified ligament, initial encounter    New Prescriptions New Prescriptions   DICLOFENAC (VOLTAREN) 50 MG EC TABLET    Take 1 tablet (50 mg total) by mouth 2 (two) times daily.   TRAMADOL (ULTRAM) 50 MG TABLET    Take 1 tablet (50 mg total) by mouth every 6 (six) hours as needed.  I personally performed the services described in this documentation, which was scribed in my presence. The recorded information has been reviewed and is accurate.     [place], NP 04/22/16 Emily, MD 04/23/16 [phone]

## 2016-04-20 NOTE — Progress Notes (Signed)
Orthopedic Tech Progress Note Patient Details:  Christine Oneal 1971-02-19 [id number]  Ortho Devices Type of Ortho Device: Knee Immobilizer Ortho Device/Splint Location: RLE Ortho Device/Splint Interventions: Ordered, Application   Braulio Bosch 04/20/2016, 7:51 PM

## 2016-10-11 ENCOUNTER — Encounter (HOSPITAL_COMMUNITY): Payer: Self-pay | Admitting: Emergency Medicine

## 2016-10-11 ENCOUNTER — Emergency Department (HOSPITAL_COMMUNITY): Payer: Self-pay

## 2016-10-11 ENCOUNTER — Emergency Department (HOSPITAL_COMMUNITY)
Admission: EM | Admit: 2016-10-11 | Discharge: 2016-10-11 | Disposition: A | Discharge status: Home / Self Care | Payer: Self-pay | Attending: Emergency Medicine | Admitting: Emergency Medicine

## 2016-10-11 DIAGNOSIS — I10 Essential (primary) hypertension: Secondary | ICD-10-CM | POA: Insufficient documentation

## 2016-10-11 DIAGNOSIS — R69 Illness, unspecified: Secondary | ICD-10-CM

## 2016-10-11 DIAGNOSIS — Z79899 Other long term (current) drug therapy: Secondary | ICD-10-CM | POA: Insufficient documentation

## 2016-10-11 DIAGNOSIS — J111 Influenza due to unidentified influenza virus with other respiratory manifestations: Secondary | ICD-10-CM | POA: Insufficient documentation

## 2016-10-11 MED ORDER — HYDROCOD POLST-CPM POLST ER 10-8 MG/5ML PO SUER: 5.0000 mL | Freq: Two times a day (BID) | ORAL | 0 refills | Status: DC | PRN

## 2016-10-11 MED ORDER — ACETAMINOPHEN 500 MG PO TABS
1000.0000 mg | ORAL_TABLET | Freq: Once | ORAL | Status: AC
  Administered 2016-10-11: 1000 mg via ORAL
  Filled 2016-10-11: qty 2

## 2016-10-11 MED ORDER — ALBUTEROL SULFATE HFA 108 (90 BASE) MCG/ACT IN AERS
2.0000 | INHALATION_SPRAY | Freq: Once | RESPIRATORY_TRACT | Status: AC
  Administered 2016-10-11: 2 via RESPIRATORY_TRACT
  Filled 2016-10-11: qty 6.7

## 2016-10-11 NOTE — ED Triage Notes (Signed)
Pt. reports generalized body aches with occasional productive cough , nasal and chest congestion onset Friday last week , denies fever or chills .

## 2016-10-11 NOTE — ED Provider Notes (Signed)
[facility] DEPT Provider Note   CSN: [id number] Arrival date & time: 10/11/16  1856     History   Chief Complaint Chief Complaint  Patient presents with  . Cough  . Nasal Congestion  . Generalized Body Aches    HPI Christine Oneal is a 46 y.o. female.  HPI 46 year old female who presents with cough. She has a history of hypertension. States that she has had mild nasal congestion over the past 5 days. However over the past day she has had subjective fevers and chills, cough productive of yellow sputum, body aches, sore throat, chest wall soreness with coughing, and general malaise. Her grandsons have been sick recently with upper respiratory illnesses. She denies any current vomiting, nausea, diarrhea, abdominal pains, dysuria or urinary frequency. Past Medical History:  Diagnosis Date  . Anemia   . Hypertension   . Leukocytosis, unspecified 10/18/2013  . Obesity     Patient Active Problem List   Diagnosis Date Noted  . Leukocytosis, unspecified 10/18/2013  . HYPERTENSION 10/24/2007  . EXTERNAL HEMORRHOIDS 10/24/2007  . ANAL FISSURE 10/24/2007    Past Surgical History:  Procedure Laterality Date  . TUBAL LIGATION  2001    OB History    No data available       Home Medications    Prior to Admission medications   Medication Sig Start Date End Date Taking? Authorizing Provider  acyclovir (ZOVIRAX) 400 MG tablet Take 2 tablets (800 mg total) by mouth 5 (five) times daily. x10 days 11/20/15   Shawnee Knapp, MD  albuterol (PROVENTIL HFA;VENTOLIN HFA) 108 (90 BASE) MCG/ACT inhaler Inhale 2 puffs into the lungs every 6 (six) hours as needed for wheezing or shortness of breath.    Historical Provider, MD  amLODipine (NORVASC) 10 MG tablet TAKE 1 TABLET (10 MG TOTAL) BY MOUTH DAILY. 02/10/16   Dorian Heckle English, PA  atenolol (TENORMIN) 50 MG tablet TAKE 1 TABLET (50 MG TOTAL) BY MOUTH DAILY. 02/16/16   Dorian Heckle English, PA  chlorpheniramine-HYDROcodone (TUSSIONEX  PENNKINETIC ER) 10-8 MG/5ML SUER Take 5 mLs by mouth every 12 (twelve) hours as needed for cough. 10/11/16   Forde Dandy, MD  diclofenac (VOLTAREN) 50 MG EC tablet Take 1 tablet (50 mg total) by mouth 2 (two) times daily. 04/20/16   Hope Bunnie Pion, NP  hydrOXYzine (ATARAX/VISTARIL) 25 MG tablet Take 1 tablet (25 mg total) by mouth 3 (three) times daily as needed. 11/20/15   Shawnee Knapp, MD  ondansetron (ZOFRAN ODT) 4 MG disintegrating tablet Take 1 tablet (4 mg total) by mouth every 8 (eight) hours as needed for nausea or vomiting. 10/14/15   Ezekiel Slocumb, PA-C  traMADol (ULTRAM) 50 MG tablet Take 1 tablet (50 mg total) by mouth every 6 (six) hours as needed. 04/20/16   Hope Bunnie Pion, NP    Family History Family History  Problem Relation Age of Onset  . Hypertension Mother   . Cancer Mother   . Hypertension Father   . Stroke Father   . Cancer Maternal Grandmother   . Diabetes Sister   . Hypertension Brother     Social History Social History  Substance Use Topics  . Smoking status: Never Smoker  . Smokeless tobacco: Never Used     Comment: never used tobacco  . Alcohol use No     Allergies   Lisinopril-hydrochlorothiazide   Review of Systems Review of Systems  Constitutional: Positive for fatigue.  HENT: Positive for congestion.  Respiratory: Positive for cough and shortness of breath.   Cardiovascular: Negative for leg swelling.  Gastrointestinal: Negative for diarrhea and vomiting.  Genitourinary: Negative for difficulty urinating.  Allergic/Immunologic: Negative for immunocompromised state.  Neurological: Negative for syncope.  Hematological: Does not bruise/bleed easily.  Psychiatric/Behavioral: Negative for confusion.     Physical Exam Updated Vital Signs BP (!) 174/94 (BP Location: Left Arm) Comment (BP Location): BP taken in pt's forearm  Pulse (!) 115   Temp (!) 100.5 F (38.1 C) (Oral)   Resp 18   Ht 5\' 3"  (1.6 m)   Wt 280 lb (127 kg)   LMP 09/28/2016  (Approximate)   SpO2 94%   BMI 49.60 kg/m   Physical Exam Physical Exam  Nursing note and vitals reviewed. Constitutional: Low energy non-toxic, and in no acute distress Head: Normocephalic and atraumatic.  Mouth/Throat: Oropharynx is clear and dry mucous membranes.  Neck: Normal range of motion. Neck supple.  Cardiovascular: Tachycardic rate and regular rhythm.   Pulmonary/Chest: Effort normal and breath sounds normal.  Abdominal: Soft. There is no tenderness. There is no rebound and no guarding.  Musculoskeletal: Normal range of motion. no edema Neurological: Alert, no facial droop, fluent speech, moves all extremities symmetrically Skin: Skin is warm and dry.  Psychiatric: Cooperative   ED Treatments / Results  Labs (all labs ordered are listed, but only abnormal results are displayed) Labs Reviewed - No data to display  EKG  EKG Interpretation None       Radiology Dg Chest 2 View  Result Date: 10/11/2016 CLINICAL DATA:  Productive cough with fever. EXAM: CHEST  2 VIEW COMPARISON:  08/20/2015 FINDINGS: The lungs are clear wiithout focal pneumonia, edema, pneumothorax or pleural effusion. The cardiopericardial silhouette is within normal limits for size. The visualized bony structures of the thorax are intact. IMPRESSION: No active cardiopulmonary disease. Electronically Signed   By: Misty Stanley M.D.   On: 10/11/2016 20:43    Procedures Procedures (including critical care time)  Medications Ordered in ED Medications  acetaminophen (TYLENOL) tablet 1,000 mg (1,000 mg Oral Given 10/11/16 2034)  albuterol (PROVENTIL HFA;VENTOLIN HFA) 108 (90 Base) MCG/ACT inhaler 2 puff (2 puffs Inhalation Given 10/11/16 2034)     Initial Impression / Assessment and Plan / ED Course  I have reviewed the triage vital signs and the nursing notes.  Pertinent labs & imaging results that were available during my care of the patient were reviewed by me and considered in my medical decision  making (see chart for details).     Presenting with fever, cough, congestion, and body aches. Presentation consistent with likely flulike illness. She does have fever here, and given Tylenol. Chest x-ray visualized and does not show pneumonia, edema, or other acute cardiopulmonary processes. I discussed continued supportive care at home for likely flulike illness. Strict return and follow-up instructions reviewed. She expressed understanding of all discharge instructions and felt comfortable with the plan of care.   Final Clinical Impressions(s) / ED Diagnoses   Final diagnoses:  Influenza-like illness    New Prescriptions New Prescriptions   CHLORPHENIRAMINE-HYDROCODONE (TUSSIONEX PENNKINETIC ER) 10-8 MG/5ML SUER    Take 5 mLs by mouth every 12 (twelve) hours as needed for cough.     Forde Dandy, MD 10/11/16 2147

## 2016-10-11 NOTE — Discharge Instructions (Signed)
Your chest x-ray does not show pneumonia. You likely have the flu. Give yourself 1-2 weeks to get better.  Take ibuprofen and tylenol for fever and body aches. Drink fluids. Get plenty of rest  Return for worsening symptoms, including fever > 6-7 days, confusion, difficulty breathing, severe chest pain or any other symptoms concerning to you.

## 2016-10-11 NOTE — ED Notes (Addendum)
Pt has been having cough, congestion, sore throat for few days. Pt lungs are CTA. Pt's grandkids are sick with similar illness. No pain noted at this time

## 2016-12-04 ENCOUNTER — Other Ambulatory Visit: Payer: Self-pay | Admitting: Physician Assistant

## 2017-01-28 ENCOUNTER — Other Ambulatory Visit: Payer: Self-pay | Admitting: Physician Assistant

## 2017-05-22 ENCOUNTER — Other Ambulatory Visit: Payer: Self-pay | Admitting: Family Medicine

## 2017-06-16 ENCOUNTER — Ambulatory Visit (INDEPENDENT_AMBULATORY_CARE_PROVIDER_SITE_OTHER): Payer: PRIVATE HEALTH INSURANCE | Admitting: Certified Nurse Midwife

## 2017-06-16 ENCOUNTER — Other Ambulatory Visit: Payer: Self-pay

## 2017-06-16 ENCOUNTER — Other Ambulatory Visit (HOSPITAL_COMMUNITY)
Admission: RE | Admit: 2017-06-16 | Discharge: 2017-06-16 | Disposition: A | Discharge status: Home / Self Care | Payer: PRIVATE HEALTH INSURANCE | Source: Ambulatory Visit | Attending: Certified Nurse Midwife | Admitting: Certified Nurse Midwife

## 2017-06-16 ENCOUNTER — Encounter: Payer: Self-pay | Admitting: Certified Nurse Midwife

## 2017-06-16 VITALS — BP 142/72 | HR 68 | Resp 16 | Ht 62.75 in | Wt 294.0 lb

## 2017-06-16 DIAGNOSIS — N926 Irregular menstruation, unspecified: Secondary | ICD-10-CM | POA: Diagnosis not present

## 2017-06-16 DIAGNOSIS — Z862 Personal history of diseases of the blood and blood-forming organs and certain disorders involving the immune mechanism: Secondary | ICD-10-CM | POA: Diagnosis not present

## 2017-06-16 DIAGNOSIS — N938 Other specified abnormal uterine and vaginal bleeding: Secondary | ICD-10-CM | POA: Diagnosis not present

## 2017-06-16 DIAGNOSIS — Z124 Encounter for screening for malignant neoplasm of cervix: Secondary | ICD-10-CM | POA: Insufficient documentation

## 2017-06-16 DIAGNOSIS — Z01419 Encounter for gynecological examination (general) (routine) without abnormal findings: Secondary | ICD-10-CM | POA: Diagnosis not present

## 2017-06-16 DIAGNOSIS — R5383 Other fatigue: Secondary | ICD-10-CM | POA: Diagnosis not present

## 2017-06-16 NOTE — Patient Instructions (Signed)

## 2017-06-16 NOTE — Progress Notes (Signed)
46 y.o. Christine Oneal Divorced  African American Fe here to establish gyn care and  for annual exam. Periods regular, but with every other cycle with spotting and  Increase bleeding, and increase clotting. History of fibroids noted with abnormal pap smear evaluation with colposcopy and PUS. Last pap smear "a few years ago". Was told she could have hysterectomy if periods increase in amount with fibroids. "May be at that point now" [place] for hypertension, asthma management. Has not been seen yet this year but has an appointment in the next month.. Requests labs today,"not usually done there". Has never had mammogram and would like to schedule one. Feels she has gained weight because she is not sleeping as well and not exercising, but starting to work on. Having some swelling of feet and legs and trying to work on water intake. No headaches or vision changes. No other health issues today.  Patient's last menstrual period was 05/28/2017 (exact date).          Sexually active: Yes.    The current method of family planning is tubal ligation.    Exercising: No.  exercise Smoker:  no  Health Maintenance: Pap:  Long time ago abnormal with endometrial cell?  Had fiborids History of Abnormal Pap: yes MMG:  none Self Breast exams: no Colonoscopy:  none BMD:   none TDaP:  unsure Shingles: no Pneumonia: no Hep C and HIV: HIV neg per patient Labs: yes if needed.   reports that  has never smoked. she has never used smokeless tobacco. She reports that she does not drink alcohol or use drugs.  Past Medical History:  Diagnosis Date  . Abnormal Pap smear of cervix   . Anemia   . Fibroid   . Hypertension   . Leukocytosis, unspecified 10/18/2013  . Obesity   . Plantar fasciitis     Past Surgical History:  Procedure Laterality Date  . TUBAL LIGATION  2001    Current Outpatient Medications  Medication Sig Dispense Refill  . albuterol (PROVENTIL HFA;VENTOLIN HFA) 108 (90 BASE) MCG/ACT  inhaler Inhale 2 puffs into the lungs every 6 (six) hours as needed for wheezing or shortness of breath.    Marland Kitchen amLODipine (NORVASC) 10 MG tablet TAKE 1 TABLET BY MOUTH DAILY  Office visit needed for refills 30 tablet 0  . atenolol (TENORMIN) 50 MG tablet Take 1 tablet (50 mg total) daily by mouth. Office visit needed for refills 30 tablet 0   No current facility-administered medications for this visit.     Family History  Problem Relation Age of Onset  . Hypertension Mother   . Cervical cancer Mother   . Hypertension Father   . Stroke Father   . Uterine cancer Maternal Grandmother   . Diabetes Sister   . Hypertension Brother     ROS:  Pertinent items are noted in HPI.  Otherwise, a comprehensive ROS was negative.  Exam:   BP (!) 142/72   Pulse 68   Resp 16   Ht 5' 2.75" (1.594 m)   Wt 294 lb (133.4 kg)   LMP 05/28/2017 (Exact Date)   BMI 52.50 kg/m  Height: 5' 2.75" (159.4 cm) Ht Readings from Last 3 Encounters:  06/16/17 5' 2.75" (1.594 m)  10/11/16 5\' 3"  (1.6 m)  11/20/15 5\' 3"  (1.6 m)    General appearance: alert, cooperative and appears stated age Head: Normocephalic, without obvious abnormality, atraumatic Neck: no adenopathy, supple, symmetrical, trachea midline and thyroid normal to inspection and  palpation Lungs: clear to auscultation bilaterally Breasts: normal appearance, no masses or tenderness, No nipple retraction or dimpling, No nipple discharge or bleeding, No axillary or supraclavicular adenopathy, large pendulous Heart: regular rate and rhythm Abdomen: soft, non-tender; no masses,  no organomegaly Extremities: extremities normal, atraumatic, no cyanosis or edema Skin: Skin color, texture, turgor normal. No rashes or lesions Lymph nodes: Cervical, supraclavicular, and axillary nodes normal. No abnormal inguinal nodes palpated Neurologic: Grossly normal   Pelvic: External genitalia:  no lesions              Urethra:  normal appearing urethra with no  masses, tenderness or lesions              Bartholin's and Skene's: normal                 Vagina: normal appearing vagina with normal color and discharge, no lesions              Cervix: no cervical motion tenderness, no lesions and normal appearance              Pap taken: Yes.   Bimanual Exam:  Uterus:  normal size, contour, position, consistency, mobility, non-tender and firm in anterior area fibroid feel, difficult to palpate due to body habitus              Adnexa: no mass, fullness, tenderness and no large masses noted, difficult to palpate due to body habitus               Rectovaginal: Confirms               Anus:  normal sphincter tone, no lesions  Chaperone present: yes  A:  Well Woman with normal exam  Contraception BTL  DUB with ? Regular periods, history of anemia with  History of fibroids per patient  Hypertension/asthma management with PCP has appointment this month  Morbid obesity  Screening labs  Family history of uterine cancer MGM late age  Family history of cervical cancer mother ? age    P:   Reviewed health and wellness pertinent to exam  Discussed DUB could be related to weight, thyroid, perimenopausal changes or history of fibroids. Discussed PUS to assess and make recommendations to control. Patient agreeable to scheduling. Also would like to check CBC for anemia. Patient will be called with insurance information and PUS scheduled. She may also need endometrial biopsy due to family history of uterine cancer.  Lab: CBC  Discussed importance of regular visits with PCP for good control of hypertension and weight/asthma. Patient plans to keep appointment so her medication can be renewed.  Discussed Bariatric nutrition clinic for weight loss and importance for overall health. Patient will consider, but has lost weight on her own before and plans to keep working on.  Labs:CBC, FSH,TSH, Vitamin D  Pap smear: yes   counseled on breast self exam, mammography screening,  feminine hygiene, adequate intake of calcium and vitamin D, diet and exercise, stressed importance of routine GYN care due to family history of uterine and endometrial cancer.  return annually or prn   12 minutes in face to face discussion at length of concern with DUB and family history of female cancer, with questions answered so she could understand the concerns.  An After Visit Summary was printed and given to the patient.

## 2017-06-17 ENCOUNTER — Other Ambulatory Visit: Payer: Self-pay | Admitting: Certified Nurse Midwife

## 2017-06-17 ENCOUNTER — Encounter: Payer: Self-pay | Admitting: Certified Nurse Midwife

## 2017-06-17 DIAGNOSIS — E559 Vitamin D deficiency, unspecified: Secondary | ICD-10-CM

## 2017-06-17 DIAGNOSIS — D500 Iron deficiency anemia secondary to blood loss (chronic): Secondary | ICD-10-CM

## 2017-06-17 LAB — CBC
HEMATOCRIT: 29.1 % — AB (ref 34.0–46.6)
Hemoglobin: 9.5 g/dL — ABNORMAL LOW (ref 11.1–15.9)
MCH: 28.4 pg (ref 26.6–33.0)
MCHC: 32.6 g/dL (ref 31.5–35.7)
MCV: 87 fL (ref 79–97)
Platelets: 512 10*3/uL — ABNORMAL HIGH (ref 150–379)
RBC: 3.35 x10E6/uL — ABNORMAL LOW (ref 3.77–5.28)
RDW: 17.1 % — AB (ref 12.3–15.4)
WBC: 10.8 10*3/uL (ref 3.4–10.8)

## 2017-06-17 LAB — FOLLICLE STIMULATING HORMONE: FSH: 5.7 m[IU]/mL

## 2017-06-17 LAB — VITAMIN D 25 HYDROXY (VIT D DEFICIENCY, FRACTURES): VIT D 25 HYDROXY: 9.4 ng/mL — AB (ref 30.0–100.0)

## 2017-06-17 LAB — TSH: TSH: 2.19 u[IU]/mL (ref 0.450–4.500)

## 2017-06-18 LAB — CYTOLOGY - PAP
Diagnosis: NEGATIVE
HPV: NOT DETECTED

## 2017-06-21 ENCOUNTER — Other Ambulatory Visit: Payer: Self-pay

## 2017-06-21 ENCOUNTER — Telehealth: Payer: Self-pay | Admitting: Certified Nurse Midwife

## 2017-06-21 ENCOUNTER — Telehealth: Payer: Self-pay

## 2017-06-21 MED ORDER — FUSION PLUS PO CAPS: 1.0000 | ORAL_CAPSULE | Freq: Two times a day (BID) | ORAL | 1 refills | Status: DC

## 2017-06-21 MED ORDER — VITAMIN D (ERGOCALCIFEROL) 1.25 MG (50000 UNIT) PO CAPS: 50000.0000 [IU] | ORAL_CAPSULE | ORAL | 0 refills | Status: DC

## 2017-06-21 NOTE — Telephone Encounter (Signed)
-----   Message from Regina Eck, CNM sent at 06/19/2017  7:33 AM EST ----- Pap smear is negative HPVHR not detected 02

## 2017-06-21 NOTE — Telephone Encounter (Signed)
Call placed to patient to review benefits for a recommended ultrasound. Left voicemail message requesting a return call. °

## 2017-06-21 NOTE — Telephone Encounter (Signed)
lmtcb

## 2017-06-28 NOTE — Telephone Encounter (Signed)
2nd call placed to patient to review benefits for recommended ultrasound. Left voicemail message requesting a return call.

## 2017-06-30 NOTE — Telephone Encounter (Signed)
3rd call placed to patient to review benefits and schedule recommended ultrasound. Left voicemail message requesting a return call

## 2017-07-06 NOTE — Telephone Encounter (Signed)
Call placed to patient on 06/21/17, 06/28/17 and 06/30/17 to review benefits and schedule recommended ultrasound. Patient has not returned calls. Forwarding to provider to advise how to proceed.  Routing to [employer], CNM

## 2017-07-08 NOTE — Telephone Encounter (Signed)
Since she was a new patient, a letter that we have been trying to contact her. ??

## 2017-07-11 NOTE — Telephone Encounter (Signed)
Patient called to speak with nurse regarding her iron pill prescription. Would like to try over the counter supplement because of price.

## 2017-07-11 NOTE — Telephone Encounter (Signed)
Left message to call Sharee Pimple at [phone].  Reviewed with Melvia Heaps, CNM. May take Slow Fe bid as alternative to Fusion Plus. Recommended f/u with PUS.

## 2017-07-14 NOTE — Telephone Encounter (Signed)
No answer, unable to leave message, message states "person you are calling is not accepting calls at this time".

## 2017-07-21 NOTE — Telephone Encounter (Signed)
Left message to call Threasa Kinch at [phone].  

## 2017-07-27 ENCOUNTER — Telehealth: Payer: Self-pay | Admitting: Certified Nurse Midwife

## 2017-07-27 NOTE — Telephone Encounter (Signed)
Call transferred from [place]. Reviewed alternative to Fusion plus as seen below per Melvia Heaps, CNM. Patient states she has not taken any iron, will await return call for review of benefits to schedule PUS.   Routing to [employer], CNM FYI.   Cc: Lerry Liner

## 2017-07-27 NOTE — Telephone Encounter (Signed)
Spoke with patient regarding benefit with new insurance for an ultrasound. Patient understood and agreeable. Patient ready to schedule. Patient scheduled 08/04/17 with Dr Sabra Heck. Patient advised we can draw recommended labs at that appointment. Patient aware of appointment date, arrival time and cancellation policy. No further questions.   Routing to Dr. Sabra Heck for final review  cc: Melvia Heaps, CNM

## 2017-07-27 NOTE — Telephone Encounter (Signed)
Spoke with patient regarding recommended appointment for ultrasound. Patient advised she has new coverage with [facility] 07/12/17. Patient has provided new insurance information for pre-certification.  Call routed to Glorianne Manchester, RN to convey lab results  Routing to Glorianne Manchester, RN  cc: Melvia Heaps, CNM

## 2017-07-28 NOTE — Telephone Encounter (Signed)
Per review of Epic, patient scheduled for PUS with Dr. Sabra Heck on 08/04/17, will close encounter.   Routing to provider for final review. Patient is agreeable to disposition. Will close encounter.  Cc: Dr. Sabra Heck, Lerry Liner

## 2017-08-01 ENCOUNTER — Telehealth: Payer: Self-pay | Admitting: Obstetrics & Gynecology

## 2017-08-01 NOTE — Telephone Encounter (Signed)
Patient would like to reschedule her PUS/OV from 08/04/17. To Deloris Ping to reschedule.

## 2017-08-02 ENCOUNTER — Other Ambulatory Visit: Payer: PRIVATE HEALTH INSURANCE

## 2017-08-04 ENCOUNTER — Other Ambulatory Visit: Payer: PRIVATE HEALTH INSURANCE

## 2017-08-04 ENCOUNTER — Other Ambulatory Visit: Payer: PRIVATE HEALTH INSURANCE | Admitting: Obstetrics & Gynecology

## 2017-08-09 NOTE — Telephone Encounter (Signed)
Call placed to patient to reschedule ultrasound appointment with Dr Sabra Heck. Left voicemail message requesting a return call.

## 2017-08-28 IMAGING — CR DG CHEST 2V
2 series · 2 of 2 positions shown · non-contrast
Comparison: 12/19/2011

CLINICAL DATA: Shortness of breath, cough, fever for 1 day.

EXAM:
CHEST  2 VIEW

[chest pa]
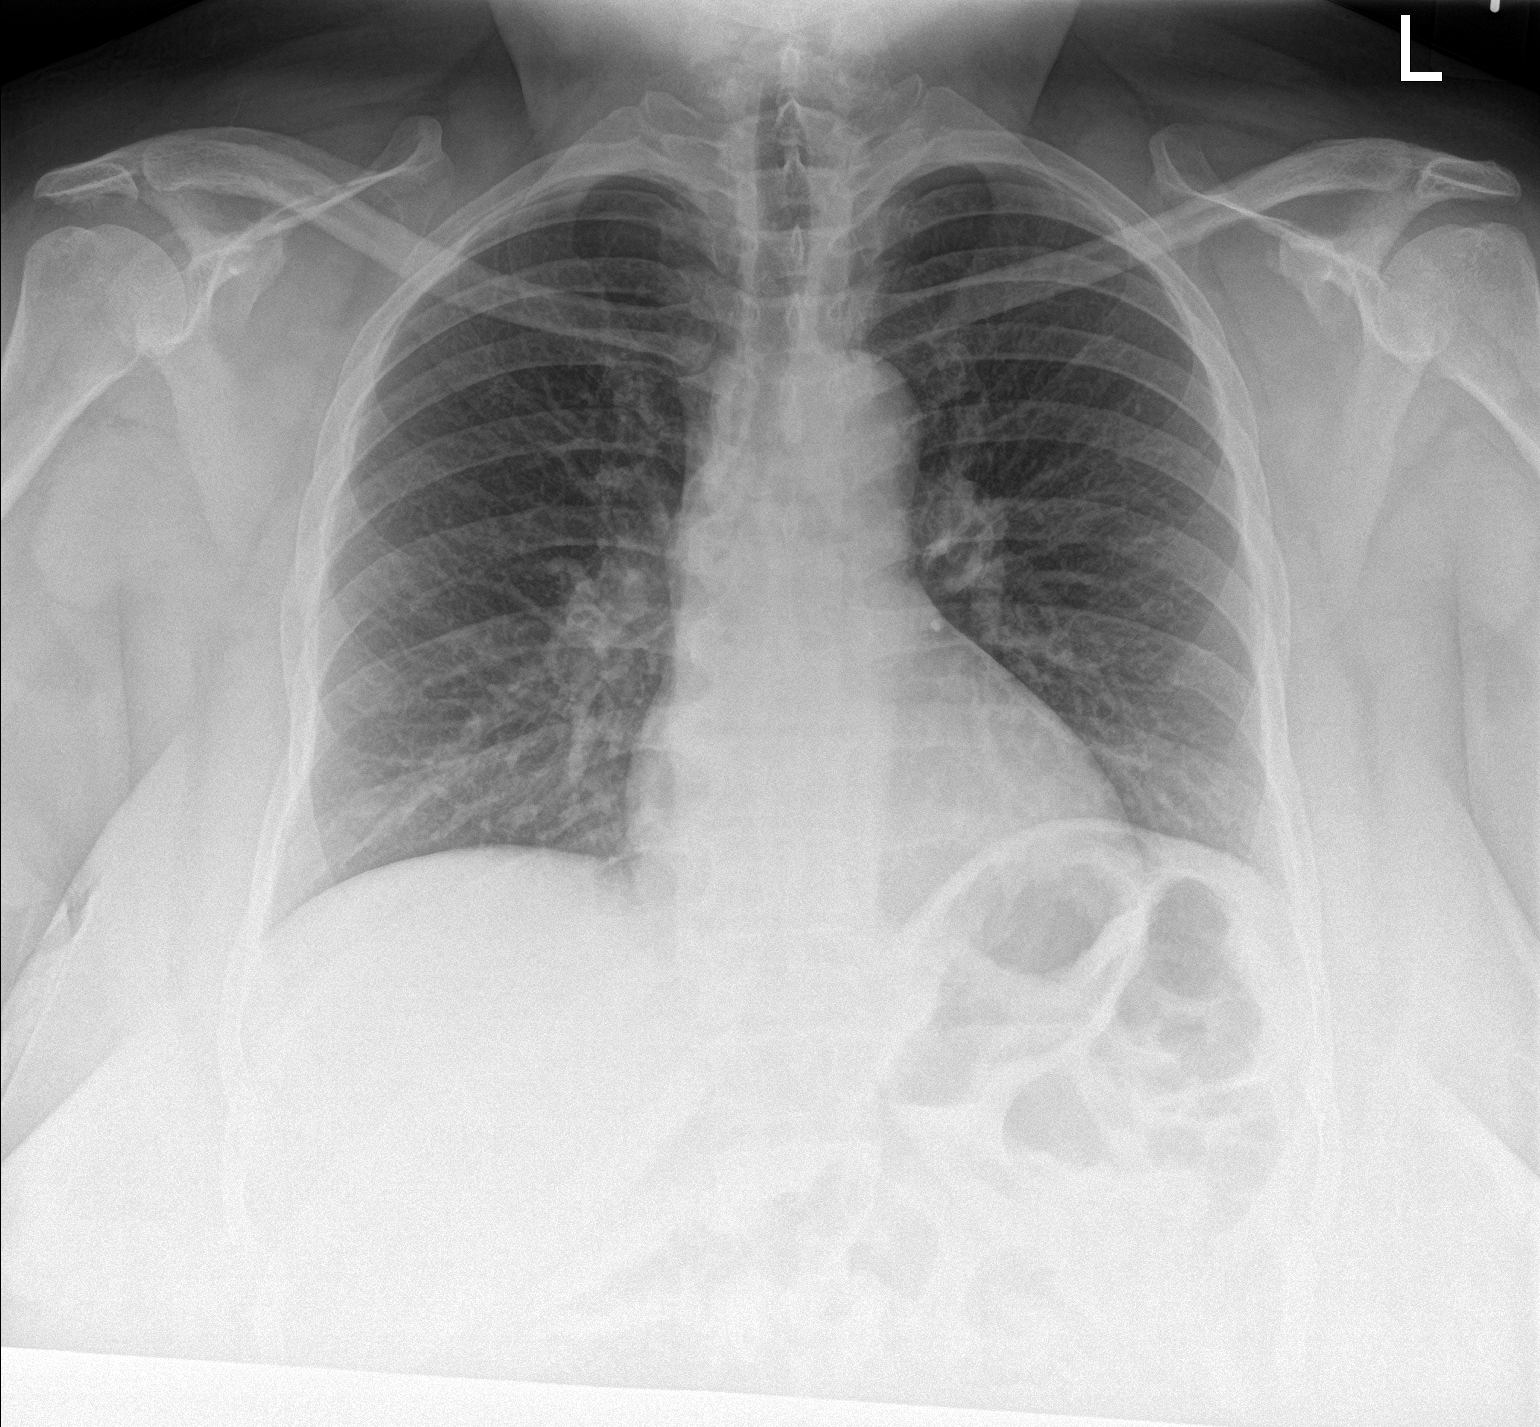

[chest lat]
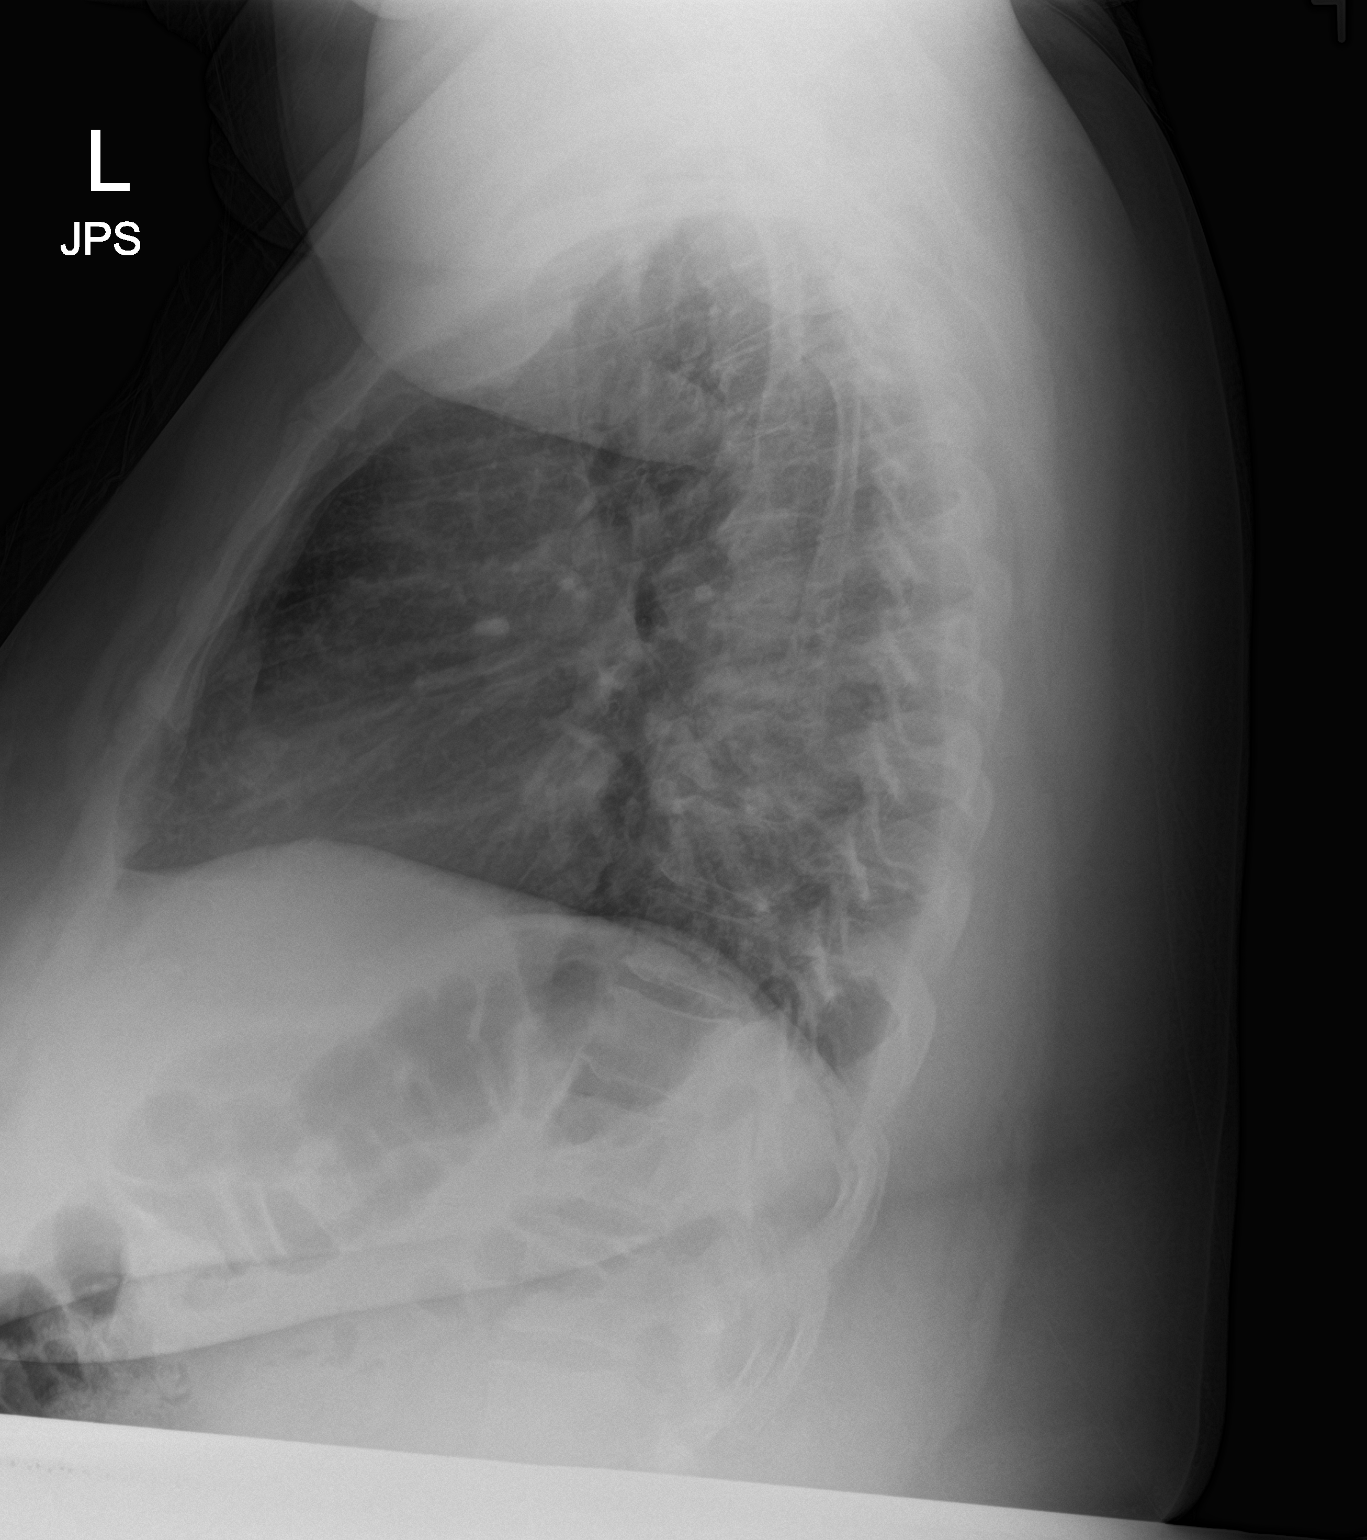

[2 of 2 positions shown; findings below may reference images not displayed]

FINDINGS: Cardiomediastinal silhouette is normal. Mediastinal contours appear
intact.

There is no evidence of focal airspace consolidation, pleural
effusion or pneumothorax.

Osseous structures are without acute abnormality. Soft tissues are
grossly normal.
IMPRESSION: No active cardiopulmonary disease.

## 2017-08-31 ENCOUNTER — Emergency Department (HOSPITAL_COMMUNITY)
Admission: EM | Admit: 2017-08-31 | Discharge: 2017-08-31 | Disposition: A | Discharge status: Home / Self Care | Payer: Managed Care, Other (non HMO) | Attending: Emergency Medicine | Admitting: Emergency Medicine

## 2017-08-31 ENCOUNTER — Emergency Department (HOSPITAL_COMMUNITY): Payer: Managed Care, Other (non HMO)

## 2017-08-31 ENCOUNTER — Encounter (HOSPITAL_COMMUNITY): Payer: Self-pay | Admitting: Emergency Medicine

## 2017-08-31 DIAGNOSIS — I10 Essential (primary) hypertension: Secondary | ICD-10-CM | POA: Diagnosis not present

## 2017-08-31 DIAGNOSIS — Z79899 Other long term (current) drug therapy: Secondary | ICD-10-CM | POA: Diagnosis not present

## 2017-08-31 DIAGNOSIS — R079 Chest pain, unspecified: Secondary | ICD-10-CM | POA: Diagnosis present

## 2017-08-31 DIAGNOSIS — J189 Pneumonia, unspecified organism: Secondary | ICD-10-CM | POA: Diagnosis not present

## 2017-08-31 LAB — BASIC METABOLIC PANEL
ANION GAP: 9 (ref 5–15)
BUN: 11 mg/dL (ref 6–20)
CALCIUM: 9.4 mg/dL (ref 8.9–10.3)
CO2: 21 mmol/L — AB (ref 22–32)
Chloride: 111 mmol/L (ref 101–111)
Creatinine, Ser: 0.73 mg/dL (ref 0.44–1.00)
GFR calc non Af Amer: >60 mL/min (ref >60)
Glucose, Bld: 115 mg/dL — ABNORMAL HIGH (ref 65–99)
POTASSIUM: 3.5 mmol/L (ref 3.5–5.1)
Sodium: 141 mmol/L (ref 135–145)

## 2017-08-31 LAB — CBC
HEMATOCRIT: 30.7 % — AB (ref 36.0–46.0)
HEMOGLOBIN: 9.5 g/dL — AB (ref 12.0–15.0)
MCH: 27.1 pg (ref 26.0–34.0)
MCHC: 30.9 g/dL (ref 30.0–36.0)
MCV: 87.5 fL (ref 78.0–100.0)
Platelets: 337 10*3/uL (ref 150–400)
RBC: 3.51 MIL/uL — AB (ref 3.87–5.11)
RDW: 17.4 % — ABNORMAL HIGH (ref 11.5–15.5)
WBC: 12.6 10*3/uL — ABNORMAL HIGH (ref 4.0–10.5)

## 2017-08-31 LAB — I-STAT BETA HCG BLOOD, ED (MC, WL, AP ONLY): I-stat hCG, quantitative: <5 m[IU]/mL (ref <5)

## 2017-08-31 LAB — I-STAT TROPONIN, ED: TROPONIN I, POC: 0.01 ng/mL (ref 0.00–0.08)

## 2017-08-31 LAB — INFLUENZA PANEL BY PCR (TYPE A & B)
Influenza A By PCR: NEGATIVE
Influenza B By PCR: NEGATIVE

## 2017-08-31 MED ORDER — OSELTAMIVIR PHOSPHATE 75 MG PO CAPS: 75.0000 mg | ORAL_CAPSULE | Freq: Two times a day (BID) | ORAL | 0 refills | Status: DC

## 2017-08-31 MED ORDER — DOXYCYCLINE HYCLATE 100 MG PO CAPS: 100.0000 mg | ORAL_CAPSULE | Freq: Two times a day (BID) | ORAL | 0 refills | Status: DC

## 2017-08-31 MED ORDER — AMLODIPINE BESYLATE 10 MG PO TABS: ORAL_TABLET | ORAL | 0 refills | Status: DC

## 2017-08-31 NOTE — ED Notes (Signed)
Provided patient with chicken broth for throat.

## 2017-08-31 NOTE — Discharge Instructions (Signed)
Please read attached information. If you experience any new or worsening signs or symptoms please return to the emergency room for evaluation. Please follow-up with your primary care provider or specialist as discussed. Please use medication prescribed only as directed and discontinue taking if you have any concerning signs or symptoms.   °

## 2017-08-31 NOTE — ED Notes (Signed)
Patient verbalized understanding of discharge instructions and denies any further needs or questions at this time. VS stable. Patient ambulatory with steady gait.  

## 2017-08-31 NOTE — ED Provider Notes (Addendum)
[facility] EMERGENCY DEPARTMENT Provider Note   CSN: [id number] Arrival date & time: 08/31/17  1627     History   Chief Complaint Chief Complaint  Patient presents with  . Chest Pain    HPI Christine Oneal is a 47 y.o. female.  HPI   47 year old female presents today with complaints of upper respiratory infection.  Patient notes last week she had sore throat and nasal congestion.  Patient notes coughing over the last week with very minimal shortness of breath.  Patient denies any fever at home.  She reports that she has been having intermittent left shoulder pain, worse with movement, typical of her previous shoulder pain.  Patient denies chest pain.    Past Medical History:  Diagnosis Date  . Abnormal Pap smear of cervix   . Anemia   . Fibroid   . Hypertension   . Leukocytosis, unspecified 10/18/2013  . Obesity   . Plantar fasciitis     Patient Active Problem List   Diagnosis Date Noted  . Leukocytosis, unspecified 10/18/2013  . HYPERTENSION 10/24/2007  . EXTERNAL HEMORRHOIDS 10/24/2007  . ANAL FISSURE 10/24/2007    Past Surgical History:  Procedure Laterality Date  . COLPOSCOPY    . TUBAL LIGATION  2001    OB History    Gravida Para Term Preterm AB Living   4 4 4     4    SAB TAB Ectopic Multiple Live Births                   Home Medications    Prior to Admission medications   Medication Sig Start Date End Date Taking? Authorizing Provider  albuterol (PROVENTIL HFA;VENTOLIN HFA) 108 (90 BASE) MCG/ACT inhaler Inhale 2 puffs into the lungs every 6 (six) hours as needed for wheezing or shortness of breath.    [provider]  amLODipine (NORVASC) 10 MG tablet TAKE 1 TABLET BY MOUTH DAILY  Office visit needed for refills 08/31/17   Geoffrey Hynes, Dellis Filbert, PA-C  atenolol (TENORMIN) 50 MG tablet Take 1 tablet (50 mg total) daily by mouth. Office visit needed for refills 05/22/17   Wendie Agreste, MD  doxycycline (VIBRAMYCIN) 100 MG  capsule Take 1 capsule (100 mg total) by mouth 2 (two) times daily. 08/31/17   Cortasia Screws, Dellis Filbert, PA-C  Iron-FA-B Cmp-C-Biot-Probiotic (FUSION PLUS) CAPS Take 1 capsule by mouth 2 (two) times daily. 06/21/17   Regina Eck, CNM  oseltamivir (TAMIFLU) 75 MG capsule Take 1 capsule (75 mg total) by mouth every 12 (twelve) hours. 08/31/17   Audree Schrecengost, Dellis Filbert, PA-C  Vitamin D, Ergocalciferol, (DRISDOL) 50000 units CAPS capsule Take 1 capsule (50,000 Units total) by mouth every 7 (seven) days. 06/21/17   Regina Eck, CNM    Family History Family History  Problem Relation Age of Onset  . Hypertension Mother   . Cervical cancer Mother   . Hypertension Father   . Stroke Father   . Uterine cancer Maternal Grandmother   . Diabetes Sister   . Hypertension Brother     Social History Social History   Tobacco Use  . Smoking status: Never Smoker  . Smokeless tobacco: Never Used  Substance Use Topics  . Alcohol use: No  . Drug use: No     Allergies   Lisinopril-hydrochlorothiazide   Review of Systems Review of Systems  All other systems reviewed and are negative.  Physical Exam Updated Vital Signs BP (!) 147/75 (BP Location: Right Arm)   Pulse  76   Temp 99.2 F (37.3 C) (Oral)   Resp 18   Ht 5\' 3"  (1.6 m)   Wt 133.4 kg (294 lb)   LMP 08/28/2017   SpO2 100%   BMI 52.08 kg/m   Physical Exam  Constitutional: She is oriented to person, place, and time. She appears well-developed and well-nourished.  HENT:  Head: Normocephalic and atraumatic.  Eyes: Conjunctivae are normal. Pupils are equal, round, and reactive to light. Right eye exhibits no discharge. Left eye exhibits no discharge. No scleral icterus.  Neck: Normal range of motion. No JVD present. No tracheal deviation present.  Cardiovascular: Normal rate, regular rhythm, normal heart sounds and intact distal pulses. Exam reveals no gallop and no friction rub.  Pulmonary/Chest: Effort normal. No stridor. No respiratory  distress. She has no wheezes. She has no rales. She exhibits no tenderness.  Minor crackles left lower lobe  Musculoskeletal:  Left shoulder atraumatic full active range of motion nontender to palpation grip strength 5 out of 5 sensation intact  Neurological: She is alert and oriented to person, place, and time. Coordination normal.  Psychiatric: She has a normal mood and affect. Her behavior is normal. Judgment and thought content normal.  Nursing note and vitals reviewed.    ED Treatments / Results  Labs (all labs ordered are listed, but only abnormal results are displayed) Labs Reviewed  BASIC METABOLIC PANEL - Abnormal; Notable for the following components:      Result Value   CO2 21 (*)    Glucose, Bld 115 (*)    All other components within normal limits  CBC - Abnormal; Notable for the following components:   WBC 12.6 (*)    RBC 3.51 (*)    Hemoglobin 9.5 (*)    HCT 30.7 (*)    RDW 17.4 (*)    All other components within normal limits  INFLUENZA PANEL BY PCR (TYPE A & B)  I-STAT TROPONIN, ED  I-STAT BETA HCG BLOOD, ED ([facility], [facility], [facility] ONLY)    EKG  EKG Interpretation None       Radiology Dg Chest 2 View  Result Date: 08/31/2017 CLINICAL DATA:  Productive cough and left-sided chest pain for 3 days. EXAM: CHEST  2 VIEW COMPARISON:  10/11/2016 FINDINGS: The heart size and mediastinal contours are within normal limits. Both lungs are clear. The visualized skeletal structures are unremarkable. IMPRESSION: No active cardiopulmonary disease. Electronically Signed   By: Earle Gell M.D.   On: 08/31/2017 17:46    Procedures Procedures (including critical care time)  Medications Ordered in ED Medications - No data to display   Initial Impression / Assessment and Plan / ED Course  I have reviewed the triage vital signs and the nursing notes.  Pertinent labs & imaging results that were available during my care of the patient were reviewed by me and considered in my medical  decision making (see chart for details).    Final Clinical Impressions(s) / ED Diagnoses   Final diagnoses:  Community acquired pneumonia, unspecified laterality  Hypertension, unspecified type   Labs: I-STAT beta-hCG, BMP, CBC  Imaging: DG chest 2 view  Consults:  Therapeutics:  Discharge Meds: Doxycycline, Tamiflu  Assessment/Plan:   47 year old female presents today with upper respiratory infection.  She is afebrile here.  She does have slight elevation in WBC and minor crackles on exam.  Clinically I feel this is pneumonia of induration symptoms and physical exam findings.  She has no comp gating features and is  well-appearing.  She will be stable for outpatient antibiotic therapy.  Patient will be have influenza testing, and sent home with Tamiflu.  She is encouraged to follow along on my chart for test results and initiate therapy if results are positive.  Patient is given strict return precautions, she verbalized understanding and agreement to today's plan had no further questions or concerns at time of discharge.  Patient also hypertensive here.  Encouraged follow-up with primary care for ongoing management.  Refills given here.   ED Discharge Orders        Ordered    doxycycline (VIBRAMYCIN) 100 MG capsule  2 times daily     08/31/17 2114    oseltamivir (TAMIFLU) 75 MG capsule  Every 12 hours     08/31/17 2114    amLODipine (NORVASC) 10 MG tablet     08/31/17 2127       Francee Gentile 08/31/17 2128    Lajean Saver, MD 08/31/17 2142

## 2017-08-31 NOTE — ED Triage Notes (Addendum)
Pt presents to ED for assessment of cough, congestion, sore throat, chills, body aches since last week.  Starting 2 patient began having SOB and left shoulder/chest pain.  Patient states historical for her with an URI.

## 2017-09-03 ENCOUNTER — Other Ambulatory Visit: Payer: Self-pay

## 2017-09-03 ENCOUNTER — Encounter: Payer: Self-pay | Admitting: Physician Assistant

## 2017-09-03 ENCOUNTER — Ambulatory Visit (INDEPENDENT_AMBULATORY_CARE_PROVIDER_SITE_OTHER): Payer: Managed Care, Other (non HMO) | Admitting: Physician Assistant

## 2017-09-03 VITALS — BP 148/83 | HR 97 | Temp 99.2°F | Resp 16 | Ht 63.0 in | Wt 297.4 lb

## 2017-09-03 DIAGNOSIS — Z6841 Body Mass Index (BMI) 40.0 and over, adult: Secondary | ICD-10-CM

## 2017-09-03 DIAGNOSIS — M25512 Pain in left shoulder: Secondary | ICD-10-CM

## 2017-09-03 DIAGNOSIS — E559 Vitamin D deficiency, unspecified: Secondary | ICD-10-CM

## 2017-09-03 DIAGNOSIS — M25562 Pain in left knee: Secondary | ICD-10-CM | POA: Diagnosis not present

## 2017-09-03 DIAGNOSIS — J45909 Unspecified asthma, uncomplicated: Secondary | ICD-10-CM | POA: Diagnosis not present

## 2017-09-03 DIAGNOSIS — J189 Pneumonia, unspecified organism: Secondary | ICD-10-CM | POA: Diagnosis not present

## 2017-09-03 DIAGNOSIS — D649 Anemia, unspecified: Secondary | ICD-10-CM

## 2017-09-03 DIAGNOSIS — M25561 Pain in right knee: Secondary | ICD-10-CM

## 2017-09-03 DIAGNOSIS — I10 Essential (primary) hypertension: Secondary | ICD-10-CM

## 2017-09-03 DIAGNOSIS — G8929 Other chronic pain: Secondary | ICD-10-CM | POA: Diagnosis not present

## 2017-09-03 MED ORDER — AMLODIPINE BESYLATE 10 MG PO TABS: ORAL_TABLET | ORAL | 3 refills | Status: DC

## 2017-09-03 MED ORDER — BENZONATATE 100 MG PO CAPS: 100.0000 mg | ORAL_CAPSULE | Freq: Three times a day (TID) | ORAL | 0 refills | Status: DC | PRN

## 2017-09-03 MED ORDER — ALBUTEROL SULFATE HFA 108 (90 BASE) MCG/ACT IN AERS: 2.0000 | INHALATION_SPRAY | Freq: Four times a day (QID) | RESPIRATORY_TRACT | 1 refills | Status: DC | PRN

## 2017-09-03 MED ORDER — VITAMIN D (ERGOCALCIFEROL) 1.25 MG (50000 UNIT) PO CAPS: 50000.0000 [IU] | ORAL_CAPSULE | ORAL | 3 refills | Status: DC

## 2017-09-03 MED ORDER — MELOXICAM 15 MG PO TABS: 15.0000 mg | ORAL_TABLET | Freq: Every day | ORAL | 1 refills | Status: DC

## 2017-09-03 MED ORDER — CYCLOBENZAPRINE HCL 10 MG PO TABS: 5.0000 mg | ORAL_TABLET | Freq: Every evening | ORAL | 0 refills | Status: DC | PRN

## 2017-09-03 MED ORDER — ATENOLOL 50 MG PO TABS: 50.0000 mg | ORAL_TABLET | Freq: Every day | ORAL | 3 refills | Status: DC

## 2017-09-03 NOTE — Patient Instructions (Signed)
     IF you received an x-ray today, you will receive an invoice from [facility]. Please contact [facility] at [phone] with questions or concerns regarding your invoice.   IF you received labwork today, you will receive an invoice from [facility]. Please contact [facility] at [phone] with questions or concerns regarding your invoice.   Our billing staff will not be able to assist you with questions regarding bills from these companies.  You will be contacted with the lab results as soon as they are available. The fastest way to get your results is to activate your My Chart account. Instructions are located on the last page of this paperwork. If you have not heard from us regarding the results in 2 weeks, please contact this office.     

## 2017-09-03 NOTE — Progress Notes (Signed)
Patient ID: Christine Oneal, female     DOB: 05-14-1971, 47 y.o.    MRN: [id number]  PCP: Harrison Mons, PA-C  Chief Complaint  Patient presents with  . Establish Care    knee pain in both knees for a couple months, left shoulder pain x 3 weeks     Subjective:   This patient presents for evaluation of knee and shoulder pain. In addition, she desires to establish here for primary care. She has been seen here previously for acute issues.  Bilateral knee pain x months. THe pain is sharp and shooting, but brief, lasting only seconds. Occurs with sitting and standing, ranges 1-4/10, and can radiate down the leg. Rubbing the knees with her hands and ibuprofen help "a little." No recalled trauma or injury, change in activity.   LEFT shoulder pain began 3 weeks ago, radiates into the upper back and neck (points to the trapezius). The pain is sharp, and lasts up to a couple of minutes, rated 6-7/10. Worse with repetitive movements of the shoulder. Vick's VapoRub helps "a little."  Seen in the ED 3 days ago with upper respiratory symptoms: cough, sore throat, nasal congestion. No fever. Minimal SOB. She reported intermittent LEFT shoulder pain, "worse with movement, typical of her previous shoulder pain." Exam revealed minor crackles in the LLL. BMET was non-fasting and notable for CO2 21, glucose 115. CBC revealed WBC 12.6, Hgb 9.5, HCT 30.5, RDW 17.4. Flu test was NEGATIVE. Troponin was not elevated. Serum HCG was negative. CXR was normal. Diagnosed with CAP and prescribed Tamiflu (this was apparently prescribed and filled prior to resulting the flu test. As it was negative, the patient did not start this drug) and doxycycline. She is tolerating the doxycycline without adverse effects, and feels better overall, but continues to cough quite a lot.   Review of Systems As above. Constitutional: Negative.  Negative for activity change, appetite change, chills, diaphoresis, fatigue and unexpected  weight change.  HENT: Positive for congestion. Negative for ear discharge, ear pain, mouth sores, rhinorrhea, sinus pressure, sinus pain and trouble swallowing.   Eyes: Negative.  Negative for photophobia, pain and visual disturbance.  Respiratory: Positive for cough, shortness of breath ("Out of breath sometimes," worse with URI.) and wheezing (Comes and goes.). Negative for apnea and chest tightness.   Cardiovascular: Positive for leg swelling (Elevation improves swelling. Had it "forever."). Negative for chest pain and palpitations.  Gastrointestinal: Positive for abdominal distention and constipation (Occasional. ). Negative for abdominal pain, anal bleeding, blood in stool, diarrhea, nausea and vomiting.  Endocrine: Negative.  Negative for polydipsia and polyuria.  Genitourinary: Positive for menstrual problem (Very heavy, clotting, and very painful. Has appointment for [facility] Negative for difficulty urinating, dyspareunia, dysuria, pelvic pain, urgency, vaginal discharge and vaginal pain.  Musculoskeletal: Positive for arthralgias, back pain and myalgias. Negative for gait problem, joint swelling, neck pain and neck stiffness.       Pain in both knees for the last couple of months. Left shoulder pain for 3 weeks.  Allergic/Immunologic: Negative.  Negative for environmental allergies, food allergies and immunocompromised state.  Neurological: Positive for headaches (Occasionally.). Negative for dizziness, weakness, light-headedness and numbness.  Hematological: Bruises/bleeds easily.  Psychiatric/Behavioral: Negative.  Negative for dysphoric mood. The patient is not nervous/anxious.     Depression screen [facility] 2/9 09/03/2017 11/20/2015 10/14/2015 09/09/2015 06/17/2015  Decreased Interest 0 0 0 0 0  Down, Depressed, Hopeless 0 0 0 0 0  PHQ - 2 Score  0 0 0 0 0     Prior to Admission medications   Medication Sig Start Date End Date Taking? Authorizing Provider  albuterol (PROVENTIL  HFA;VENTOLIN HFA) 108 (90 BASE) MCG/ACT inhaler Inhale 2 puffs into the lungs every 6 (six) hours as needed for wheezing or shortness of breath.   Yes [provider]  amLODipine (NORVASC) 10 MG tablet TAKE 1 TABLET BY MOUTH DAILY  Office visit needed for refills 08/31/17  Yes Hedges, Dellis Filbert, PA-C  atenolol (TENORMIN) 50 MG tablet Take 1 tablet (50 mg total) daily by mouth. Office visit needed for refills 05/22/17  Yes Wendie Agreste, MD  doxycycline (VIBRAMYCIN) 100 MG capsule Take 1 capsule (100 mg total) by mouth 2 (two) times daily. 08/31/17  Yes Hedges, Dellis Filbert, PA-C  Iron-FA-B Cmp-C-Biot-Probiotic (FUSION PLUS) CAPS Take 1 capsule by mouth 2 (two) times daily. 06/21/17  Yes Regina Eck, CNM  Vitamin D, Ergocalciferol, (DRISDOL) 50000 units CAPS capsule Take 1 capsule (50,000 Units total) by mouth every 7 (seven) days. 06/21/17  Yes Regina Eck, CNM  oseltamivir (TAMIFLU) 75 MG capsule Take 1 capsule (75 mg total) by mouth every 12 (twelve) hours. Patient not taking: Reported on 09/03/2017 08/31/17   Okey Regal, PA-C     Allergies  Allergen Reactions  . Lisinopril-Hydrochlorothiazide Anaphylaxis     Patient Active Problem List   Diagnosis Date Noted  . Anemia 09/04/2017  . BMI 50.0-59.9, adult ([place]) 09/03/2017  . Reactive airway disease 09/03/2017  . Vitamin D deficiency 09/03/2017  . Leukocytosis, unspecified 10/18/2013  . Essential hypertension 10/24/2007  . EXTERNAL HEMORRHOIDS 10/24/2007  . ANAL FISSURE 10/24/2007     Family History  Problem Relation Age of Onset  . Hypertension Mother   . Cervical cancer Mother   . Hypertension Father   . Stroke Father   . Uterine cancer Maternal Grandmother   . Diabetes Sister   . Hypertension Brother      Social History   Socioeconomic History  . Marital status: Divorced    Spouse name: Not on file  . Number of children: 4  . Years of education: Not on file  . Highest education level: Not on  file  Social Needs  . Financial resource strain: Not on file  . Food insecurity - worry: Not on file  . Food insecurity - inability: Not on file  . Transportation needs - medical: Not on file  . Transportation needs - non-medical: Not on file  Occupational History  . 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 Smoking status: Never Smoker  . Smokeless tobacco: Never Used  Substance and Sexual Activity  . Alcohol use: No  . Drug use: No  . Sexual activity: Yes    Partners: Male    Birth control/protection: Surgical    Comment: BTL  Other Topics Concern  . Not on file  Social History Narrative   Rivka has 4 children. Three live at home with her.         Objective:  Physical Exam  Constitutional: She is oriented to person, place, and time. Vital signs are normal. She appears well-developed and well-nourished. No distress.  BP (!) 148/83   Pulse 97   Temp 99.2 F (37.3 C)   Resp 16   Ht 5\' 3"  (1.6 m)   Wt 297 lb 6.4 oz (134.9 kg)   LMP 08/28/2017   SpO2 100%   BMI 52.68 kg/m   HENT:  Head:  Normocephalic and atraumatic.  Right Ear: Hearing normal.  Left Ear: Hearing normal.  Eyes: Conjunctivae and EOM are normal. Pupils are equal, round, and reactive to light.  Neck: Normal range of motion and phonation normal. Neck supple. No thyroid mass and no thyromegaly present.  Cardiovascular: Normal rate, regular rhythm and normal heart sounds.  Pulmonary/Chest: Effort normal and breath sounds normal.  Musculoskeletal:       Right shoulder: Normal.       Left shoulder: She exhibits tenderness (generalized, worst in the trapexius muscle, tender also into the upper lateral pectoralis), pain and spasm (trapezius). She exhibits normal range of motion, no bony tenderness, no swelling, no effusion, no crepitus, no deformity, no laceration, normal pulse and normal strength.       Left elbow: Normal.       Left wrist: Normal.       Right knee: Normal.       Left knee:  Normal.       Cervical back: Normal.  Lymphadenopathy:    She has no cervical adenopathy.  Neurological: She is alert and oriented to person, place, and time.  Skin: Skin is warm, dry and intact. No ecchymosis and no laceration noted.  Psychiatric: She has a normal mood and affect. Her speech is normal and behavior is normal. Judgment and thought content normal. Cognition and memory are normal.    Wt Readings from Last 3 Encounters:  09/03/17 297 lb 6.4 oz (134.9 kg)  08/31/17 294 lb (133.4 kg)  06/16/17 294 lb (133.4 kg)       Assessment & Plan:   Problem List Items Addressed This Visit    Essential hypertension    Elevated today, likely due to illness and coughing. Continue amlodipine 10 mg and atenolol 50 mg daily. Reassess when well. Consider INCREASE in atenolol dose or additional of a third agent.      Relevant Medications   atenolol (TENORMIN) 50 MG tablet   amLODipine (NORVASC) 10 MG tablet   BMI 50.0-59.9, adult (HCC)    Plan update TSH, vitamin D and CBC next visit. When well, will discuss healthy lifestyle changes and consider referral to [facility]. Consider OSA.      Reactive airway disease    No wheezing on exam today. Refill albuterol rescue inhaler for PRN use.      Relevant Medications   albuterol (PROVENTIL HFA;VENTOLIN HFA) 108 (90 Base) MCG/ACT inhaler   Vitamin D deficiency    Refilled supplement today. Recheck level at next visit when well.      Relevant Medications   Vitamin D, Ergocalciferol, (DRISDOL) 50000 units CAPS capsule   Anemia    Will update at next visit when well.       Other Visit Diagnoses    Chronic pain of both knees    -  Primary   Meloxicam and cyclobenzaprine. refer to PT.   Relevant Medications   meloxicam (MOBIC) 15 MG tablet   cyclobenzaprine (FLEXERIL) 10 MG tablet   Other Relevant Orders   Ambulatory referral to Physical Therapy   Acute pain of left shoulder       Meloxicam and cyclobenzaprine. refer to PT.   Relevant  Medications   meloxicam (MOBIC) 15 MG tablet   cyclobenzaprine (FLEXERIL) 10 MG tablet   Other Relevant Orders   Ambulatory referral to Physical Therapy   Community acquired pneumonia, unspecified laterality       Complete doxycycline. Add benzonatate for cough.   Relevant Medications   benzonatate (  TESSALON) 100 MG capsule   albuterol (PROVENTIL HFA;VENTOLIN HFA) 108 (90 Base) MCG/ACT inhaler       Return in about 4 weeks (around 10/01/2017) for re-evaluation of blood pressure and fasting labs.   Fara Chute, PA-C [facility]

## 2017-09-03 NOTE — Progress Notes (Signed)
Subjective:    Patient ID: Christine Oneal, female    DOB: [birth year], 47 y.o.   MRN: [id number]  Chief Complaint  Patient presents with  . Establish Care    knee pain in both knees for a couple months, left shoulder pain x 3 weeks    HPI Patient is coming in to establish care and to evaluate the knee pain that she has had in both knees and shoulder pain that she has in her left shoulder. The knee pain has been present for months. It is a sharp, shooting pain that lasts for a couple of seconds at a time. Patient cannot identify any triggers, but it has occurred while sitting and standing.She has tried rubbing the knees and taking Ibuprofen, both help "a little."  It can radiate down her leg. The knee pain is worst during the day and is at 1/10 right now but is a 4/10 when it 'acts up."   The left shoulder pain started three weeks ago. It radiates to the left back and neck (trapezius area.) It is sharp and can last a couple minutes. She has tried putting Vaporub on it, this helps "a little." Repetitive movements make it worse. When it occurs, it is a 6 or 7/10.   Patient Active Problem List   Diagnosis Date Noted  . BMI 50.0-59.9, adult ([place]) 09/03/2017  . Reactive airway disease 09/03/2017  . Vitamin D deficiency 09/03/2017  . Leukocytosis, unspecified 10/18/2013  . Essential hypertension 10/24/2007  . EXTERNAL HEMORRHOIDS 10/24/2007  . ANAL FISSURE 10/24/2007   Allergies  Allergen Reactions  . Lisinopril-Hydrochlorothiazide Anaphylaxis   Prior to Admission medications   Medication Sig Start Date End Date Taking? Authorizing Provider  albuterol (PROVENTIL HFA;VENTOLIN HFA) 108 (90 Base) MCG/ACT inhaler Inhale 2 puffs into the lungs every 6 (six) hours as needed for wheezing or shortness of breath. 09/03/17  Yes Jeffery, Chelle, PA-C  amLODipine (NORVASC) 10 MG tablet TAKE 1 TABLET BY MOUTH DAILY 09/03/17  Yes Jeffery, Chelle, PA-C  atenolol (TENORMIN) 50 MG tablet Take 1 tablet (50 mg  total) by mouth daily. 09/03/17  Yes Jeffery, Chelle, PA-C  doxycycline (VIBRAMYCIN) 100 MG capsule Take 1 capsule (100 mg total) by mouth 2 (two) times daily. 08/31/17  Yes Hedges, Dellis Filbert, PA-C  Iron-FA-B Cmp-C-Biot-Probiotic (FUSION PLUS) CAPS Take 1 capsule by mouth 2 (two) times daily. 06/21/17  Yes Regina Eck, CNM  Vitamin D, Ergocalciferol, (DRISDOL) 50000 units CAPS capsule Take 1 capsule (50,000 Units total) by mouth every 7 (seven) days. 09/03/17  Yes Harrison Mons, PA-C                         Past Medical History:  Diagnosis Date  . Abnormal Pap smear of cervix   . Allergy   . Anemia   . Fibroid   . Hypertension   . Leukocytosis, unspecified 10/18/2013  . Obesity   . Plantar fasciitis    Social History   Socioeconomic History  . Marital status: Divorced    Spouse name: Not on file  . Number of children: 4  . Years of education: Not on file  . Highest education level: Not on file  Social Needs  . Financial resource strain: Not on file  . Food insecurity - worry: Not on file  . Food insecurity - inability: Not on file  . Transportation needs - medical: Not on file  . Transportation needs - non-medical: Not on file  Occupational History  . 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 Smoking status: Never Smoker  . Smokeless tobacco: Never Used  Substance and Sexual Activity  . Alcohol use: No  . Drug use: No  . Sexual activity: Yes    Partners: Male    Birth control/protection: Surgical    Comment: BTL  Other Topics Concern  . Not on file  Social History Narrative   Henretter has 4 children. Three live at home with her.   Family History  Problem Relation Age of Onset  . Hypertension Mother   . Cervical cancer Mother   . Hypertension Father   . Stroke Father   . Uterine cancer Maternal Grandmother   . Diabetes Sister   . Hypertension Brother   . Hypertension Brother    Past Surgical History:  Procedure Laterality Date  .  COLPOSCOPY    . TUBAL LIGATION  2001    Review of Systems  Constitutional: Negative.  Negative for activity change, appetite change, chills, diaphoresis, fatigue and unexpected weight change.  HENT: Positive for congestion. Negative for ear discharge, ear pain, mouth sores, rhinorrhea, sinus pressure, sinus pain and trouble swallowing.   Eyes: Negative.  Negative for photophobia, pain and visual disturbance.  Respiratory: Positive for cough, shortness of breath ("Out of breath sometimes," worse with URI.) and wheezing (Comes and goes.). Negative for apnea and chest tightness.   Cardiovascular: Positive for leg swelling (Elevation improves swelling. Had it "forever."). Negative for chest pain and palpitations.  Gastrointestinal: Positive for abdominal distention and constipation (Occasional. ). Negative for abdominal pain, anal bleeding, blood in stool, diarrhea, nausea and vomiting.  Endocrine: Negative.  Negative for polydipsia and polyuria.  Genitourinary: Positive for menstrual problem (Very heavy, clotting, and very painful. Has appointment for [facility] Negative for difficulty urinating, dyspareunia, dysuria, pelvic pain, urgency, vaginal discharge and vaginal pain.  Musculoskeletal: Positive for arthralgias, back pain and myalgias. Negative for gait problem, joint swelling, neck pain and neck stiffness.       Pain in both knees for the last couple of months. Left shoulder pain for 3 weeks.  Allergic/Immunologic: Negative.  Negative for environmental allergies, food allergies and immunocompromised state.  Neurological: Positive for headaches (Occasionally.). Negative for dizziness, weakness, light-headedness and numbness.  Hematological: Bruises/bleeds easily.  Psychiatric/Behavioral: Negative.  Negative for dysphoric mood. The patient is not nervous/anxious.        Objective:   Physical Exam  Constitutional: She is oriented to person, place, and time. She appears well-developed  and well-nourished.  BP (!) 148/83   Pulse 97   Temp 99.2 F (37.3 C)   Resp 16   Ht 5\' 3"  (1.6 m)   Wt 297 lb 6.4 oz (134.9 kg)   LMP 08/28/2017   SpO2 100%   BMI 52.68 kg/m   HENT:  Head: Normocephalic and atraumatic.  Right Ear: External ear normal.  Left Ear: External ear normal.  Nose: Nose normal.  Mouth/Throat: Oropharynx is clear and moist.  Eyes: Conjunctivae and EOM are normal. Pupils are equal, round, and reactive to light.  Neck: Normal range of motion. Neck supple.  Cardiovascular: Normal rate, regular rhythm, normal heart sounds and intact distal pulses. Exam reveals no gallop and no friction rub.  No murmur heard. Pulmonary/Chest: Effort normal and breath sounds normal. She has no wheezes.  Abdominal: Soft. Bowel sounds are normal.  Musculoskeletal: Normal range of motion. She exhibits tenderness (Upon palation of left shoulder. Left trapezius  muscle is tight and tender upon palpation. Deltoid is tender upon palpation. Lateral aspect of right pectoralis major muscle also tender to palpation. ). She exhibits no deformity.  Neurological: She is alert and oriented to person, place, and time. She has normal reflexes.  Skin: Skin is warm and dry. No rash noted. No erythema. No pallor.  Psychiatric: She has a normal mood and affect. Her behavior is normal. Judgment and thought content normal.    Wt Readings from Last 3 Encounters:  09/03/17 297 lb 6.4 oz (134.9 kg)  08/31/17 294 lb (133.4 kg)  06/16/17 294 lb (133.4 kg)       Assessment & Plan:   1. Chronic pain of both knees Patient presented with chronic pain of bath knees. She has had this pain for months and cannot identify triggers of properly manage it. 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 Knee pain could not be replicated with assisted movement on PE. Knees were not tender to palpation, hot or swollen. Better ergonomics for heavy lifting and other physical tasks was discussed. Referral to  PT was made. Meloxicam and Flexeril were prescribed for pain and inflammation.   - meloxicam (MOBIC) 15 MG tablet; Take 1 tablet (15 mg total) by mouth daily.  Dispense: 30 tablet; Refill: 1 - cyclobenzaprine (FLEXERIL) 10 MG tablet; Take 0.5-1 tablets (5-10 mg total) by mouth at bedtime as needed for muscle spasms.  Dispense: 30 tablet; Refill: 0 - Ambulatory referral to Physical Therapy  2. Acute pain of left shoulder Patient presented with left shoulder pain that has been present for 3 weeks. It radiates to the chest, left neck, and back areas. It is triggered by repetitive motion. It is sharp in character, intermittent, and can last a couple of minutes in duration. Left shoulder, trapezius, deltoid, and left pectoralis major were tender to palpation on PE. Left shoulder was able to move in all ranges of motion without pain on PE. Pain could not be replicated with movement on PE. Better ergonomics for heavy lifting and other physical tasks was discussed. Referral to PT was made. Meloxicam and Flexeril were prescribed for pain and inflammation.   - meloxicam (MOBIC) 15 MG tablet; Take 1 tablet (15 mg total) by mouth daily.  Dispense: 30 tablet; Refill: 1 - cyclobenzaprine (FLEXERIL) 10 MG tablet; Take 0.5-1 tablets (5-10 mg total) by mouth at bedtime as needed for muscle spasms.  Dispense: 30 tablet; Refill: 0 - Ambulatory referral to Physical Therapy  3. BMI 50.0-59.9, adult ([place]) BMI was specifically discussed at today's visit. Will be addressed at follow up visit in 4 weeks. Patient would likely be a good candidate for medical weight management.   4. Essential hypertension Blood pressure was high at today's visit. This will be rechecked at 4 week follow up before further evaluation or intervention is made.   - atenolol (TENORMIN) 50 MG tablet; Take 1 tablet (50 mg total) by mouth daily.  Dispense: 90 tablet; Refill: 3 - amLODipine (NORVASC) 10 MG tablet; TAKE 1 TABLET BY MOUTH DAILY   Dispense: 90 tablet; Refill: 3  5. Community acquired pneumonia, unspecified laterality Patient was diagnosed with CAP in the ER on 08/31/2017. She was given Doxycycline and has felt less shortness of breath, and decreased coughing since starting the Doxycycline. She was not given a cough suppressant in the ER, but her cough still lingers. On PE her lungs were clear to auscultation. Overall, her CAP symptoms are improving  but  Tessalon was prescribed to improve cough.   - benzonatate (TESSALON) 100 MG capsule; Take 1-2 capsules (100-200 mg total) by mouth 3 (three) times daily as needed for cough.  Dispense: 40 capsule; Refill: 0  6. Reactive airway disease without complication, unspecified asthma severity, unspecified whether persistent Patient is feeling much better and has improved SOB and cough since starting the Doxycycline on 08/2017. She still has a lingering cough. Her lungs were clear to auscultation.   - albuterol (PROVENTIL HFA;VENTOLIN HFA) 108 (90 Base) MCG/ACT inhaler; Inhale 2 puffs into the lungs every 6 (six) hours as needed for wheezing or shortness of breath.  Dispense: 18 g; Refill: 1  7. Vitamin D deficiency Her vitamin D was last tested on 06/16/2018 at which point it was low. Supplemental vitamin D is prescribed to improve this. Will reassess at next check up with PCP.  - Vitamin D, Ergocalciferol, (DRISDOL) 50000 units CAPS capsule; Take 1 capsule (50,000 Units total) by mouth every 7 (seven) days.  Dispense: 12 capsule; Refill: 3  Return in about 4 weeks (around 10/01/2017) for re-evaluation of blood pressure and fasting labs.

## 2017-09-04 ENCOUNTER — Encounter: Payer: Self-pay | Admitting: Physician Assistant

## 2017-09-04 DIAGNOSIS — D649 Anemia, unspecified: Secondary | ICD-10-CM | POA: Insufficient documentation

## 2017-09-04 NOTE — Assessment & Plan Note (Signed)
Refilled supplement today. Recheck level at next visit when well.

## 2017-09-04 NOTE — Assessment & Plan Note (Signed)
Elevated today, likely due to illness and coughing. Continue amlodipine 10 mg and atenolol 50 mg daily. Reassess when well. Consider INCREASE in atenolol dose or additional of a third agent.

## 2017-09-04 NOTE — Assessment & Plan Note (Addendum)
Plan update TSH, vitamin D and CBC next visit. When well, will discuss healthy lifestyle changes and consider referral to [facility]. Consider OSA.

## 2017-09-04 NOTE — Assessment & Plan Note (Signed)
No wheezing on exam today. Refill albuterol rescue inhaler for PRN use.

## 2017-09-04 NOTE — Assessment & Plan Note (Signed)
Will update at next visit when well.

## 2017-09-06 NOTE — Telephone Encounter (Signed)
Patient still has not returned call to reschedule PUS/OV. Okay to close enocunter?

## 2017-09-07 NOTE — Telephone Encounter (Signed)
Yes she is aware of recommendation

## 2017-09-09 ENCOUNTER — Telehealth: Payer: Self-pay | Admitting: Physician Assistant

## 2017-09-09 NOTE — Telephone Encounter (Signed)
Routing to provider  

## 2017-09-09 NOTE — Telephone Encounter (Signed)
Copied from [place] [phone]. Topic: Quick Communication - See Telephone Encounter >> Sep 09, 2017  2:24 PM Conception Chancy, NT wrote: CRM for notification. See Telephone encounter for:  09/09/17.  Patient is calling and states her insurance has always paid for albuterol (PROVENTIL HFA;VENTOLIN HFA) 108 (90 Base) MCG/ACT inhaler but now it will not. Her pharmacy told her to check with her PCP and see if it was a generic version or what is different about this RX than all the other. Please advise.

## 2017-09-10 NOTE — Telephone Encounter (Signed)
Phone message re: Albuterol inhaler sent to [facility]

## 2017-09-11 NOTE — Telephone Encounter (Signed)
My prescription is for a generic albuterol. Happy to authorize whatever preferred product is covered by her plan.

## 2017-09-13 NOTE — Telephone Encounter (Signed)
Left detailed message per Chelle message below.

## 2017-09-14 ENCOUNTER — Ambulatory Visit: Payer: Managed Care, Other (non HMO) | Attending: Physician Assistant | Admitting: Physical Therapy

## 2017-09-20 ENCOUNTER — Other Ambulatory Visit: Payer: PRIVATE HEALTH INSURANCE

## 2017-09-29 ENCOUNTER — Telehealth: Payer: Self-pay | Admitting: Certified Nurse Midwife

## 2017-09-29 ENCOUNTER — Ambulatory Visit: Payer: Managed Care, Other (non HMO) | Admitting: Physician Assistant

## 2017-09-29 ENCOUNTER — Other Ambulatory Visit: Payer: Managed Care, Other (non HMO)

## 2017-09-29 NOTE — Telephone Encounter (Signed)
Left message on voicemail regarding missed lab appointment. °

## 2017-10-13 ENCOUNTER — Encounter: Payer: Self-pay | Admitting: Physician Assistant

## 2017-10-19 ENCOUNTER — Encounter: Payer: Self-pay | Admitting: Physician Assistant

## 2017-11-08 ENCOUNTER — Ambulatory Visit: Payer: Managed Care, Other (non HMO) | Admitting: Physician Assistant

## 2017-11-09 ENCOUNTER — Encounter: Payer: Self-pay | Admitting: Physician Assistant

## 2017-11-09 ENCOUNTER — Ambulatory Visit (INDEPENDENT_AMBULATORY_CARE_PROVIDER_SITE_OTHER): Payer: Managed Care, Other (non HMO) | Admitting: Physician Assistant

## 2017-11-09 ENCOUNTER — Ambulatory Visit (INDEPENDENT_AMBULATORY_CARE_PROVIDER_SITE_OTHER): Payer: Managed Care, Other (non HMO)

## 2017-11-09 VITALS — BP 124/78 | HR 90 | Temp 98.8°F | Resp 16 | Ht 63.0 in | Wt 296.6 lb

## 2017-11-09 DIAGNOSIS — M25562 Pain in left knee: Secondary | ICD-10-CM

## 2017-11-09 DIAGNOSIS — M1712 Unilateral primary osteoarthritis, left knee: Secondary | ICD-10-CM

## 2017-11-09 DIAGNOSIS — M25561 Pain in right knee: Secondary | ICD-10-CM

## 2017-11-09 DIAGNOSIS — M545 Low back pain: Secondary | ICD-10-CM | POA: Diagnosis not present

## 2017-11-09 DIAGNOSIS — I10 Essential (primary) hypertension: Secondary | ICD-10-CM

## 2017-11-09 DIAGNOSIS — M25512 Pain in left shoulder: Secondary | ICD-10-CM | POA: Diagnosis not present

## 2017-11-09 DIAGNOSIS — J189 Pneumonia, unspecified organism: Secondary | ICD-10-CM | POA: Diagnosis not present

## 2017-11-09 DIAGNOSIS — E559 Vitamin D deficiency, unspecified: Secondary | ICD-10-CM | POA: Diagnosis not present

## 2017-11-09 DIAGNOSIS — G8929 Other chronic pain: Secondary | ICD-10-CM | POA: Diagnosis not present

## 2017-11-09 DIAGNOSIS — D72829 Elevated white blood cell count, unspecified: Secondary | ICD-10-CM

## 2017-11-09 DIAGNOSIS — Z6841 Body Mass Index (BMI) 40.0 and over, adult: Secondary | ICD-10-CM

## 2017-11-09 DIAGNOSIS — D649 Anemia, unspecified: Secondary | ICD-10-CM | POA: Diagnosis not present

## 2017-11-09 NOTE — Progress Notes (Signed)
Subjective:    Patient ID: Christine Oneal, female    DOB: 04/05/1971, 47 y.o.   MRN: [id number] Chief Complaint  Patient presents with  . Knee Pain    left knee for about 3 months   . Back Pain    mid back for about 4 months  . Shoulder Pain    right side about 4 months notices when she is charting    HPI  47 yo female presents for evaluation of knee pain, back pain and shoulder pain. 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  Left knee pain for ~4 months. No known triggers, occurs at rest and with movement. Pain is 1-2/10 today, on bad days 5-6/10. Pain is sharp and lingers for hours. Radiates down her leg to her ankle. Massaging, ibuprofen provide some relief.  Recalls falling on her R knee in 2017, describes it as a sprain. Denies trauma to L knee.   Mid back pain for several months, increased pain with lifting patients at work. Notices increased pain with bending and rolling patients at work. She has this pain everyday, all day. Ibuprofen and tylenol help with the pain, but it only "eases it up". Describes pain as aching, sore muscle like pain.  Shoulder pain: She has had bilateral shoulder pain for approximately 4 months. She has intermittent pain that when it is really bad it radiates around her entire shoulder. Pain worse when she raises her arm to chart at work on an [place] at eye level. Does not notice the pain with lifting patients. Describes the pain as sharp, today 2/10, but worst 4-5/10. Denies numbness/tingling. Denies weakness. She has tried tylenol and ibuprofen. She has been using vicks vapor rub as well which provides some relief.  Denies using her meloxicam and cyclobenzaprine from her previous visit for knee pain.   Review of Systems  Constitutional: Negative for chills and fever.  HENT: Negative.   Eyes: Negative.   Respiratory: Negative for cough and shortness of breath.   Cardiovascular: Negative.   Gastrointestinal: Negative.   Endocrine: Negative.     Genitourinary: Negative.   Musculoskeletal: Positive for arthralgias, back pain and myalgias. Negative for joint swelling.  Skin: Negative.   Allergic/Immunologic: Negative.   Neurological: Negative for weakness, light-headedness and numbness.  Hematological: Negative.   Psychiatric/Behavioral: Negative.     As above.  Patient Active Problem List   Diagnosis Date Noted  . Anemia 09/04/2017  . BMI 50.0-59.9, adult ([place]) 09/03/2017  . Reactive airway disease 09/03/2017  . Vitamin D deficiency 09/03/2017  . Leukocytosis, unspecified 10/18/2013  . Essential hypertension 10/24/2007  . EXTERNAL HEMORRHOIDS 10/24/2007  . ANAL FISSURE 10/24/2007    Past Medical History:  Diagnosis Date  . Abnormal Pap smear of cervix   . Allergy   . Anemia   . Fibroid   . Hypertension   . Leukocytosis, unspecified 10/18/2013  . Obesity   . Plantar fasciitis     Prior to Admission medications   Medication Sig Start Date End Date Taking? Authorizing Provider  albuterol (PROVENTIL HFA;VENTOLIN HFA) 108 (90 Base) MCG/ACT inhaler Inhale 2 puffs into the lungs every 6 (six) hours as needed for wheezing or shortness of breath. 09/03/17   Harrison Mons, PA-C  amLODipine (NORVASC) 10 MG tablet TAKE 1 TABLET BY MOUTH DAILY 09/03/17   Jacqulynn Cadet, Chelle, PA-C  atenolol (TENORMIN) 50 MG tablet Take 1 tablet (50 mg total) by mouth daily. 09/03/17   Harrison Mons, PA-C  benzonatate (TESSALON) 100 MG capsule Take 1-2 capsules (100-200 mg total) by mouth 3 (three) times daily as needed for cough. 09/03/17   Harrison Mons, PA-C  cyclobenzaprine (FLEXERIL) 10 MG tablet Take 0.5-1 tablets (5-10 mg total) by mouth at bedtime as needed for muscle spasms. 09/03/17   Harrison Mons, PA-C  doxycycline (VIBRAMYCIN) 100 MG capsule Take 1 capsule (100 mg total) by mouth 2 (two) times daily. 08/31/17   Hedges, Dellis Filbert, PA-C  Iron-FA-B Cmp-C-Biot-Probiotic (FUSION PLUS) CAPS Take 1 capsule by mouth 2 (two) times daily. 06/21/17    Regina Eck, CNM  meloxicam (MOBIC) 15 MG tablet Take 1 tablet (15 mg total) by mouth daily. 09/03/17   Harrison Mons, PA-C  Vitamin D, Ergocalciferol, (DRISDOL) 50000 units CAPS capsule Take 1 capsule (50,000 Units total) by mouth every 7 (seven) days. 09/03/17   Harrison Mons, PA-C    Allergies  Allergen Reactions  . Lisinopril-Hydrochlorothiazide Anaphylaxis        Objective:   Physical Exam  Constitutional: She is oriented to person, place, and time. She appears well-developed and well-nourished. No distress.  BP 124/78   Pulse 90   Temp 98.8 F (37.1 C)   Resp 16   Ht 5\' 3"  (1.6 m)   Wt 296 lb 9.6 oz (134.5 kg)   SpO2 100%   BMI 52.54 kg/m    HENT:  Head: Normocephalic and atraumatic.  Eyes: Right eye exhibits no discharge. Left eye exhibits no discharge. No scleral icterus.  Neck: Normal range of motion. No thyromegaly present.  Cardiovascular: Normal rate, regular rhythm, normal heart sounds and intact distal pulses.  Pulmonary/Chest: Effort normal and breath sounds normal.  Musculoskeletal: Normal range of motion.       Right shoulder: She exhibits tenderness (AC joint) and pain. She exhibits normal range of motion, no bony tenderness, no swelling, no effusion, no deformity, no laceration, normal pulse and normal strength.       Left shoulder: She exhibits tenderness (AC joint) and pain. She exhibits normal range of motion, no bony tenderness, no swelling, no effusion, no crepitus, no deformity, no laceration, no spasm, normal pulse and normal strength.       Right knee: She exhibits normal range of motion, no swelling, no effusion and no ecchymosis. No tenderness found.       Left knee: She exhibits normal range of motion, no swelling, no effusion, no ecchymosis, no deformity, no laceration, no erythema, normal alignment, no bony tenderness, normal meniscus and no MCL laxity. Tenderness found. Medial joint line tenderness noted.       Thoracic back: She exhibits  tenderness (Muscular). She exhibits no bony tenderness, no swelling, no edema, no deformity, no laceration and no spasm.       Lumbar back: Normal. She exhibits no tenderness, no bony tenderness, no swelling and no pain.       Back:  Neurological: She is alert and oriented to person, place, and time.  Skin: Skin is warm and dry. She is not diaphoretic.  Psychiatric: She has a normal mood and affect. Her behavior is normal.       Assessment & Plan:  1. Chronic pain of both knees Await x-ray and f/u, suspect OA/degeneration Informed to try using her meloxicam for the pain daily and to avoid using ibuprofen while taking meloxicam - DG Knee Complete 4 Views Left; Future  2. Acute pain of left shoulder Referral to PT, will reassess after PT.  3. Essential hypertension Controlled.  Await  labs and f/u - Comprehensive metabolic panel - TSH  4. BMI 50.0-59.9, adult ([place]) Await labs and f/u  - Comprehensive metabolic panel - Lipid panel - Hemoglobin A1c  5. Vitamin D deficiency Await labs and f/u  - VITAMIN D 25 Hydroxy (Vit-D Deficiency, Fractures)  6. Anemia, unspecified type Await labs and f/u  - CBC with Differential/Platelet - Iron, TIBC and Ferritin Panel  7. Community acquired pneumonia, unspecified laterality Resolved.  8. Chronic bilateral low back pain without sciatica Musculoskeletal in nature, informed to try cyclobenzaprine at night to help, even if it is half a tablet.   Previously seen for this and did not try cyclobenzaprine.   Return for re-evaluation of shoulder, back and knee pain.

## 2017-11-09 NOTE — Patient Instructions (Addendum)
1. STOP the ibuprofen. You CAN take acetaminophen if needed. 2. Take the meloxicam EVERY DAY. 3. CONSIDER taking the cyclobenzaprine at night, perhaps just 1/2 tablet. 4. If you do not get a call from the physical therapy office in the next 7 days, please let me know. 5. Adjust the tablet height on the cart so that you do not have to reach up for charting. 6. Join a pool and start water exercise (a local [employer], the [employer]).   IF you received an x-ray today, you will receive an invoice from [facility]. Please contact [facility] at [phone] with questions or concerns regarding your invoice.   IF you received labwork today, you will receive an invoice from [facility]. Please contact [facility] at [phone] with questions or concerns regarding your invoice.   Our billing staff will not be able to assist you with questions regarding bills from these companies.  You will be contacted with the lab results as soon as they are available. The fastest way to get your results is to activate your My Chart account. Instructions are located on the last page of this paperwork. If you have not heard from [place] regarding the results in 2 weeks, please contact this office.

## 2017-11-09 NOTE — Progress Notes (Signed)
Patient ID: Christine Oneal, female    DOB: 1971-01-26, 47 y.o.   MRN: [id number]  PCP: Harrison Mons, PA-C  Chief Complaint  Patient presents with  . Knee Pain    left knee for about 3 months   . Back Pain    mid back for about 4 months  . Shoulder Pain    right side about 4 months notices when she is charting     Subjective:   Presents for evaluation of several issues.  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  1. LEFT knee pain. Began about 4 months ago, ranges from 1-2/10 to 5-6/10. Sharp and radiates to the ankle. No aggravating factors, occurs at rest and weight bearing/walking. Massage and ibuprofen helps some. Recalls injury in 2017 when she fell on the RIGHT knee, described a sprain.  2. Mid-back pain. Daily, constant, worse with lifting, aching. Began several months ago. Improves some with ibuprofen and acetaminophen.  3. Bilateral shoulder pain, R>L today, x 4 months. RIGHT hand dominant. Intermittent, 2-5/10. Worst with raising her arm to chart on a tablet that is positioned at eye level. No numbness, tingling, weakness. SOme improvement with ibuprofen, acetaminophen, Vick's Vapo Rub.  She has leftover meloxicam and cyclobenzaprine from her RIGHT knee injury, but has not taken any for her current complaints.  Review of Systems Constitutional: Negative for chills and fever.  HENT: Negative.   Eyes: Negative.   Respiratory: Negative for cough and shortness of breath.   Cardiovascular: Negative.   Gastrointestinal: Negative.   Endocrine: Negative.   Genitourinary: Negative.   Musculoskeletal: Positive for arthralgias, back pain and myalgias. Negative for joint swelling.  Skin: Negative.   Allergic/Immunologic: Negative.   Neurological: Negative for weakness, light-headedness and numbness.  Hematological: Negative.   Psychiatric/Behavioral: Negative.        Patient Active Problem List   Diagnosis Date Noted  . Anemia 09/04/2017    . BMI 50.0-59.9, adult ([place]) 09/03/2017  . Reactive airway disease 09/03/2017  . Vitamin D deficiency 09/03/2017  . Leukocytosis, unspecified 10/18/2013  . Essential hypertension 10/24/2007  . EXTERNAL HEMORRHOIDS 10/24/2007  . ANAL FISSURE 10/24/2007    Prior to Admission medications   Medication Sig Start Date End Date Taking? Authorizing Provider  albuterol (PROVENTIL HFA;VENTOLIN HFA) 108 (90 Base) MCG/ACT inhaler Inhale 2 puffs into the lungs every 6 (six) hours as needed for wheezing or shortness of breath. 09/03/17  Yes Lovinia Snare, PA-C  amLODipine (NORVASC) 10 MG tablet TAKE 1 TABLET BY MOUTH DAILY 09/03/17  Yes Jylian Pappalardo, PA-C  atenolol (TENORMIN) 50 MG tablet Take 1 tablet (50 mg total) by mouth daily. 09/03/17  Yes Zelie Asbill, PA-C  Iron-FA-B Cmp-C-Biot-Probiotic (FUSION PLUS) CAPS Take 1 capsule by mouth 2 (two) times daily. 06/21/17  Yes Regina Eck, CNM  Vitamin D, Ergocalciferol, (DRISDOL) 50000 units CAPS capsule Take 1 capsule (50,000 Units total) by mouth every 7 (seven) days. 09/03/17  Yes Artrell Lawless, PA-C  cyclobenzaprine (FLEXERIL) 10 MG tablet Take 0.5-1 tablets (5-10 mg total) by mouth at bedtime as needed for muscle spasms. Patient not taking: Reported on 11/09/2017 09/03/17   Harrison Mons, PA-C  meloxicam (MOBIC) 15 MG tablet Take 1 tablet (15 mg total) by mouth daily. Patient not taking: Reported on 11/09/2017 09/03/17   Harrison Mons, PA-C       Allergies  Allergen Reactions  . Lisinopril-Hydrochlorothiazide Anaphylaxis       Objective:  Physical Exam  Constitutional:  She is oriented to person, place, and time. She appears well-developed and well-nourished. She is active and cooperative. No distress.  BP 124/78   Pulse 90   Temp 98.8 F (37.1 C)   Resp 16   Ht 5\' 3"  (1.6 m)   Wt 296 lb 9.6 oz (134.5 kg)   SpO2 100%   BMI 52.54 kg/m   HENT:  Head: Normocephalic and atraumatic.  Right Ear: Hearing normal.  Left Ear:  Hearing normal.  Eyes: Conjunctivae are normal. No scleral icterus.  Neck: Normal range of motion. Neck supple. No thyromegaly present.  Cardiovascular: Normal rate, regular rhythm and normal heart sounds.  Pulses:      Radial pulses are 2+ on the right side, and 2+ on the left side.  Pulmonary/Chest: Effort normal and breath sounds normal.  Musculoskeletal:       Right shoulder: She exhibits pain. She exhibits normal range of motion, no swelling, no effusion, no crepitus, no deformity, no laceration, no spasm, normal pulse and normal strength.       Left shoulder: She exhibits pain. She exhibits normal range of motion, no swelling, no effusion, no crepitus, no deformity, no laceration, no spasm, normal pulse and normal strength.       Right elbow: Normal.      Left elbow: Normal.       Right knee: Normal.       Left knee: She exhibits normal range of motion, no swelling, no effusion, no ecchymosis, no deformity, no laceration, no erythema, normal alignment, no LCL laxity, normal patellar mobility, normal meniscus and no MCL laxity. Tenderness found. Medial joint line tenderness noted.       Cervical back: Normal.       Thoracic back: She exhibits tenderness and pain. She exhibits normal range of motion, no bony tenderness, no swelling, no edema, no deformity, no laceration, no spasm and normal pulse.       Lumbar back: Normal.       Back:  Bilateral AC tenderness  Lymphadenopathy:       Head (right side): No tonsillar, no preauricular, no posterior auricular and no occipital adenopathy present.       Head (left side): No tonsillar, no preauricular, no posterior auricular and no occipital adenopathy present.    She has no cervical adenopathy.       Right: No supraclavicular adenopathy present.       Left: No supraclavicular adenopathy present.  Neurological: She is alert and oriented to person, place, and time. No sensory deficit.  Skin: Skin is warm, dry and intact. No rash noted. No  cyanosis or erythema. Nails show no clubbing.  Psychiatric: She has a normal mood and affect. Her speech is normal and behavior is normal.           Assessment & Plan:   Problem List Items Addressed This Visit    Vitamin D deficiency    Update vitamin D level      Relevant Orders   VITAMIN D 25 Hydroxy (Vit-D Deficiency, Fractures) (Completed)   Leukocytosis (Chronic)    Update CBC      Essential hypertension    COntrolled.      Relevant Orders   Comprehensive metabolic panel (Completed)   TSH (Completed)   BMI 50.0-59.9, adult ([place])    Healthy lifestyle changes are limited by MSK issues that prevent much exercise. Encourage aquatic exercise.      Relevant Orders   Comprehensive metabolic panel (Completed)  Lipid panel (Completed)   Hemoglobin A1c (Completed)   Anemia    Update CBC.      Relevant Orders   CBC with Differential/Platelet (Completed)   Iron, TIBC and Ferritin Panel (Completed)    Other Visit Diagnoses    Chronic pain of both knees    -  Primary   await radiographs. Given multiple joint complaints, anticipate referral. Resume meloxicam in place of OTC NSAIDS.   Relevant Orders   DG Knee Complete 4 Views Left (Completed)   Acute pain of left shoulder       Would likely benefit from injections. Await knee films, Anticipate referral to orthopedics. Resume meloxicam in place of OTC NSAIDS.   Community acquired pneumonia, unspecified laterality       resolved.   Chronic bilateral low back pain without sciatica       Resume meloxicam in place of OTC NSAIDS. Good body mechanics. may benefit from PT.       Return for re-evaluation of shoulder, back and knee pain.   Fara Chute, PA-C [facility]

## 2017-11-10 LAB — IRON,TIBC AND FERRITIN PANEL
FERRITIN: 30 ng/mL (ref 15–150)
Iron Saturation: 8 % — CL (ref 15–55)
Iron: 28 ug/dL (ref 27–159)
Total Iron Binding Capacity: 339 ug/dL (ref 250–450)
UIBC: 311 ug/dL (ref 131–425)

## 2017-11-10 LAB — LIPID PANEL
CHOL/HDL RATIO: 4.1 ratio (ref 0.0–4.4)
Cholesterol, Total: 207 mg/dL — ABNORMAL HIGH (ref 100–199)
HDL: 51 mg/dL (ref >39)
LDL Calculated: 136 mg/dL — ABNORMAL HIGH (ref 0–99)
Triglycerides: 99 mg/dL (ref 0–149)
VLDL Cholesterol Cal: 20 mg/dL (ref 5–40)

## 2017-11-10 LAB — CBC WITH DIFFERENTIAL/PLATELET
BASOS: 0 %
Basophils Absolute: 0 10*3/uL (ref 0.0–0.2)
EOS (ABSOLUTE): 0.1 10*3/uL (ref 0.0–0.4)
EOS: 1 %
HEMATOCRIT: 35.4 % (ref 34.0–46.6)
HEMOGLOBIN: 11.1 g/dL (ref 11.1–15.9)
Immature Grans (Abs): 0 10*3/uL (ref 0.0–0.1)
Immature Granulocytes: 0 %
LYMPHS ABS: 3 10*3/uL (ref 0.7–3.1)
Lymphs: 28 %
MCH: 28.8 pg (ref 26.6–33.0)
MCHC: 31.4 g/dL — AB (ref 31.5–35.7)
MCV: 92 fL (ref 79–97)
MONOCYTES: 6 %
MONOS ABS: 0.7 10*3/uL (ref 0.1–0.9)
Neutrophils Absolute: 6.9 10*3/uL (ref 1.4–7.0)
Neutrophils: 65 %
Platelets: 461 10*3/uL — ABNORMAL HIGH (ref 150–379)
RBC: 3.85 x10E6/uL (ref 3.77–5.28)
RDW: 18.5 % — ABNORMAL HIGH (ref 12.3–15.4)
WBC: 10.7 10*3/uL (ref 3.4–10.8)

## 2017-11-10 LAB — HEMOGLOBIN A1C
ESTIMATED AVERAGE GLUCOSE: 111 mg/dL
Hgb A1c MFr Bld: 5.5 % (ref 4.8–5.6)

## 2017-11-10 LAB — COMPREHENSIVE METABOLIC PANEL
A/G RATIO: 1.2 (ref 1.2–2.2)
ALBUMIN: 4 g/dL (ref 3.5–5.5)
ALT: 14 IU/L (ref 0–32)
AST: 14 IU/L (ref 0–40)
Alkaline Phosphatase: 125 IU/L — ABNORMAL HIGH (ref 39–117)
BUN / CREAT RATIO: 20 (ref 9–23)
BUN: 13 mg/dL (ref 6–24)
Bilirubin Total: 0.3 mg/dL (ref 0.0–1.2)
CO2: 24 mmol/L (ref 20–29)
Calcium: 10.2 mg/dL (ref 8.7–10.2)
Chloride: 105 mmol/L (ref 96–106)
Creatinine, Ser: 0.65 mg/dL (ref 0.57–1.00)
GFR calc Af Amer: 121 mL/min/{1.73_m2} (ref >59)
GFR, EST NON AFRICAN AMERICAN: 105 mL/min/{1.73_m2} (ref >59)
GLOBULIN, TOTAL: 3.3 g/dL (ref 1.5–4.5)
Glucose: 85 mg/dL (ref 65–99)
POTASSIUM: 3.8 mmol/L (ref 3.5–5.2)
SODIUM: 141 mmol/L (ref 134–144)
Total Protein: 7.3 g/dL (ref 6.0–8.5)

## 2017-11-10 LAB — VITAMIN D 25 HYDROXY (VIT D DEFICIENCY, FRACTURES): Vit D, 25-Hydroxy: 10.2 ng/mL — ABNORMAL LOW (ref 30.0–100.0)

## 2017-11-10 LAB — TSH: TSH: 1.7 u[IU]/mL (ref 0.450–4.500)

## 2017-11-25 ENCOUNTER — Ambulatory Visit (INDEPENDENT_AMBULATORY_CARE_PROVIDER_SITE_OTHER): Payer: Managed Care, Other (non HMO) | Admitting: Orthopaedic Surgery

## 2017-12-04 NOTE — Assessment & Plan Note (Signed)
COntrolled

## 2017-12-04 NOTE — Assessment & Plan Note (Signed)
Update CBC. 

## 2017-12-04 NOTE — Assessment & Plan Note (Signed)
Update vitamin D level 

## 2017-12-04 NOTE — Assessment & Plan Note (Signed)
Healthy lifestyle changes are limited by MSK issues that prevent much exercise. Encourage aquatic exercise.

## 2017-12-14 ENCOUNTER — Encounter: Payer: Self-pay | Admitting: Certified Nurse Midwife

## 2017-12-20 ENCOUNTER — Encounter: Payer: Self-pay | Admitting: Certified Nurse Midwife

## 2018-01-04 ENCOUNTER — Encounter (HOSPITAL_COMMUNITY): Payer: Self-pay

## 2018-01-04 ENCOUNTER — Ambulatory Visit (HOSPITAL_COMMUNITY)
Admission: EM | Admit: 2018-01-04 | Discharge: 2018-01-04 | Disposition: A | Discharge status: Home / Self Care | Payer: Managed Care, Other (non HMO) | Attending: Urgent Care | Admitting: Urgent Care

## 2018-01-04 DIAGNOSIS — M546 Pain in thoracic spine: Secondary | ICD-10-CM

## 2018-01-04 DIAGNOSIS — M62830 Muscle spasm of back: Secondary | ICD-10-CM

## 2018-01-04 DIAGNOSIS — I10 Essential (primary) hypertension: Secondary | ICD-10-CM

## 2018-01-04 MED ORDER — MELOXICAM 7.5 MG PO TABS: 7.5000 mg | ORAL_TABLET | Freq: Every day | ORAL | 0 refills | Status: DC

## 2018-01-04 MED ORDER — METHOCARBAMOL 500 MG PO TABS: 500.0000 mg | ORAL_TABLET | Freq: Three times a day (TID) | ORAL | 0 refills | Status: DC

## 2018-01-04 NOTE — Discharge Instructions (Addendum)
For a consult of physical therapy, please contact [facility].  [phone] Please call Orvan July, Office Manager to set up an appointment.

## 2018-01-04 NOTE — ED Triage Notes (Signed)
Pt presents with ongoing back pain 

## 2018-01-04 NOTE — ED Provider Notes (Signed)
  MRN: [id number] DOB: 05-Nov-1970  Subjective:   Christine Oneal is a 47 y.o. female presenting for several month history of mid-right sided back pain. Pain is intermittent, sharp in nature, can feel spasm, non-radiating. Has used ibuprofen with some relief. Denies falls, trauma, dysuria, hematuria, renal stones. Hydrates well with ~5 bottles of water daily. Has been prescribed Flexeril for her back but has not helped patient. Never tried meloxicam when she was prescribed this back in 08/2017.  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  Denies history of diabetes but has family history of this, her sister, cousin.  No current facility-administered medications for this encounter.   Current Outpatient Medications:  .  amLODipine (NORVASC) 10 MG tablet, TAKE 1 TABLET BY MOUTH DAILY, Disp: 90 tablet, Rfl: 3 .  atenolol (TENORMIN) 50 MG tablet, Take 1 tablet (50 mg total) by mouth daily., Disp: 90 tablet, Rfl: 3 .  Iron-FA-B Cmp-C-Biot-Probiotic (FUSION PLUS) CAPS, Take 1 capsule by mouth 2 (two) times daily., Disp: 60 capsule, Rfl: 1 .  Vitamin D, Ergocalciferol, (DRISDOL) 50000 units CAPS capsule, Take 1 capsule (50,000 Units total) by mouth every 7 (seven) days., Disp: 12 capsule, Rfl: 3 .  albuterol (PROVENTIL HFA;VENTOLIN HFA) 108 (90 Base) MCG/ACT inhaler, Inhale 2 puffs into the lungs every 6 (six) hours as needed for wheezing or shortness of breath., Disp: 18 g, Rfl: 1   Allergies  Allergen Reactions  . Lisinopril-Hydrochlorothiazide Anaphylaxis   Past Medical History:  Diagnosis Date  . Abnormal Pap smear of cervix   . Allergy   . Anemia   . Fibroid   . Hypertension   . Leukocytosis, unspecified 10/18/2013  . Obesity   . Plantar fasciitis     Past Surgical History:  Procedure Laterality Date  . COLPOSCOPY    . TUBAL LIGATION  2001   Objective:   Vitals: BP (!) 158/75 (BP Location: Left Arm)   Pulse 79   Temp 97.7 F  (36.5 C) (Temporal)   Resp 20   LMP 11/29/2017   SpO2 100%   BP Readings from Last 3 Encounters:  01/04/18 (!) 158/75  11/09/17 124/78  09/03/17 (!) 148/83    Physical Exam  Constitutional: She is oriented to person, place, and time. She appears well-developed and well-nourished.  Cardiovascular: Normal rate.  Pulmonary/Chest: Effort normal.  Musculoskeletal:       Thoracic back: She exhibits tenderness and spasm (over area depicted). She exhibits normal range of motion, no bony tenderness, no swelling, no edema, no deformity and no laceration.       Back:  Neurological: She is alert and oriented to person, place, and time. She displays normal reflexes. Coordination normal.   Assessment and Plan :   Muscle spasm of back  Acute right-sided thoracic back pain  Essential hypertension  Patient has a history of anaphylaxis with lisinopril and hydrochlorothiazide.  She is only able to take amlodipine and atenolol.  I did counsel her that using meloxicam for her back pain can cause an increase in her blood pressure.  I counseled that x-rays would not help [place] today given that her pain seems more muscular in nature.  Recommended she continue to hydrate as well as she has.  Will use Robaxin as a muscle relaxant.  She is also to contact [facility] to set up an office visit for physical therapy.   Jaynee Eagles, [place] 01/04/18 3875

## 2018-02-13 ENCOUNTER — Ambulatory Visit (INDEPENDENT_AMBULATORY_CARE_PROVIDER_SITE_OTHER): Payer: Managed Care, Other (non HMO)

## 2018-02-13 ENCOUNTER — Ambulatory Visit (HOSPITAL_COMMUNITY)
Admission: EM | Admit: 2018-02-13 | Discharge: 2018-02-13 | Disposition: A | Discharge status: Home / Self Care | Payer: Managed Care, Other (non HMO) | Attending: Family Medicine | Admitting: Family Medicine

## 2018-02-13 ENCOUNTER — Encounter (HOSPITAL_COMMUNITY): Payer: Self-pay

## 2018-02-13 DIAGNOSIS — E669 Obesity, unspecified: Secondary | ICD-10-CM | POA: Diagnosis not present

## 2018-02-13 DIAGNOSIS — Z6841 Body Mass Index (BMI) 40.0 and over, adult: Secondary | ICD-10-CM | POA: Diagnosis not present

## 2018-02-13 DIAGNOSIS — J069 Acute upper respiratory infection, unspecified: Secondary | ICD-10-CM | POA: Diagnosis present

## 2018-02-13 DIAGNOSIS — E559 Vitamin D deficiency, unspecified: Secondary | ICD-10-CM | POA: Diagnosis not present

## 2018-02-13 DIAGNOSIS — J22 Unspecified acute lower respiratory infection: Secondary | ICD-10-CM

## 2018-02-13 DIAGNOSIS — Z79899 Other long term (current) drug therapy: Secondary | ICD-10-CM | POA: Diagnosis not present

## 2018-02-13 DIAGNOSIS — I10 Essential (primary) hypertension: Secondary | ICD-10-CM | POA: Insufficient documentation

## 2018-02-13 DIAGNOSIS — Z791 Long term (current) use of non-steroidal anti-inflammatories (NSAID): Secondary | ICD-10-CM | POA: Insufficient documentation

## 2018-02-13 DIAGNOSIS — Z8249 Family history of ischemic heart disease and other diseases of the circulatory system: Secondary | ICD-10-CM | POA: Diagnosis not present

## 2018-02-13 DIAGNOSIS — J45909 Unspecified asthma, uncomplicated: Secondary | ICD-10-CM | POA: Diagnosis not present

## 2018-02-13 DIAGNOSIS — D649 Anemia, unspecified: Secondary | ICD-10-CM | POA: Insufficient documentation

## 2018-02-13 LAB — POCT RAPID STREP A: STREPTOCOCCUS, GROUP A SCREEN (DIRECT): NEGATIVE

## 2018-02-13 MED ORDER — ACETAMINOPHEN 325 MG PO TABS
ORAL_TABLET | ORAL | Status: AC
  Filled 2018-02-13: qty 1

## 2018-02-13 MED ORDER — AZITHROMYCIN 250 MG PO TABS: ORAL_TABLET | ORAL | 0 refills | Status: AC

## 2018-02-13 MED ORDER — ALBUTEROL SULFATE HFA 108 (90 BASE) MCG/ACT IN AERS
2.0000 | INHALATION_SPRAY | Freq: Once | RESPIRATORY_TRACT | Status: AC
  Administered 2018-02-13: 2 via RESPIRATORY_TRACT

## 2018-02-13 MED ORDER — ACETAMINOPHEN 325 MG PO TABS
975.0000 mg | ORAL_TABLET | Freq: Once | ORAL | Status: AC
  Administered 2018-02-13: 975 mg via ORAL

## 2018-02-13 MED ORDER — ALBUTEROL SULFATE HFA 108 (90 BASE) MCG/ACT IN AERS
INHALATION_SPRAY | RESPIRATORY_TRACT | Status: AC
  Filled 2018-02-13: qty 6.7

## 2018-02-13 MED ORDER — ACETAMINOPHEN 325 MG PO TABS
ORAL_TABLET | ORAL | Status: AC
  Filled 2018-02-13: qty 2

## 2018-02-13 MED ORDER — ACETAMINOPHEN 500 MG PO TABS: 1000.0000 mg | ORAL_TABLET | Freq: Three times a day (TID) | ORAL | 0 refills | Status: DC | PRN

## 2018-02-13 NOTE — ED Provider Notes (Signed)
[facility]    CSN: [id number] Arrival date & time: 02/13/18  1213     History   Chief Complaint Chief Complaint  Patient presents with  . URI    HPI Christine Oneal is a 47 y.o. female.   [name] presents with complaints of fevers, sore throat, cough, body aches, which mainly started yesterday. Cough an sore throat primarily started today. Some headache. No ear pain. No rash. No gi/gu complaints. No known ill contacts. Grandson had cough last week however. Took ibuprofen at 0630 this morning which helped some. Has mild shortness of breath. No chest pain . Hx of reactive airway disease, does not currently have an inhaler. Took her amlodipine this morning, has not taken atenolol. Did not take medications yesterday. Hx of htn.     ROS per HPI.      Past Medical History:  Diagnosis Date  . Abnormal Pap smear of cervix   . Allergy   . Anemia   . Fibroid   . Hypertension   . Leukocytosis, unspecified 10/18/2013  . Obesity   . Plantar fasciitis     Patient Active Problem List   Diagnosis Date Noted  . Anemia 09/04/2017  . BMI 50.0-59.9, adult ([place]) 09/03/2017  . Reactive airway disease 09/03/2017  . Vitamin D deficiency 09/03/2017  . Leukocytosis 10/18/2013  . Essential hypertension 10/24/2007  . EXTERNAL HEMORRHOIDS 10/24/2007  . ANAL FISSURE 10/24/2007    Past Surgical History:  Procedure Laterality Date  . COLPOSCOPY    . TUBAL LIGATION  2001    OB History    Gravida  4   Para  4   Term  4   Preterm      AB      Living  4     SAB      TAB      Ectopic      Multiple      Live Births               Home Medications    Prior to Admission medications   Medication Sig Start Date End Date Taking? Authorizing Provider  acetaminophen (TYLENOL) 500 MG tablet Take 2 tablets (1,000 mg total) by mouth every 8 (eight) hours as needed. 02/13/18   Zigmund Gottron, NP  albuterol (PROVENTIL HFA;VENTOLIN HFA) 108 (90 Base) MCG/ACT inhaler  Inhale 2 puffs into the lungs every 6 (six) hours as needed for wheezing or shortness of breath. 09/03/17   Harrison Mons, PA-C  amLODipine (NORVASC) 10 MG tablet TAKE 1 TABLET BY MOUTH DAILY 09/03/17   Jacqulynn Cadet, [place], PA-C  atenolol (TENORMIN) 50 MG tablet Take 1 tablet (50 mg total) by mouth daily. 09/03/17   Harrison Mons, PA-C  azithromycin (ZITHROMAX) 250 MG tablet Take 2 tablets (500 mg total) by mouth daily for 1 day, THEN 1 tablet (250 mg total) daily for 4 days. 02/13/18 02/18/18  Augusto Gamble B, NP  Iron-FA-B Cmp-C-Biot-Probiotic (FUSION PLUS) CAPS Take 1 capsule by mouth 2 (two) times daily. 06/21/17   Regina Eck, CNM  meloxicam (MOBIC) 7.5 MG tablet Take 1 tablet (7.5 mg total) by mouth daily. 01/04/18   Jaynee Eagles, PA-C  methocarbamol (ROBAXIN) 500 MG tablet Take 1 tablet (500 mg total) by mouth 3 (three) times daily. 01/04/18   Jaynee Eagles, PA-C  Vitamin D, Ergocalciferol, (DRISDOL) 50000 units CAPS capsule Take 1 capsule (50,000 Units total) by mouth every 7 (seven) days. 09/03/17   Harrison Mons, PA-C  Family History Family History  Problem Relation Age of Onset  . Hypertension Mother   . Cervical cancer Mother   . Hypertension Father   . Stroke Father   . Uterine cancer Maternal Grandmother   . Diabetes Sister   . Hypertension Brother   . Hypertension Brother     Social History Social History   Tobacco Use  . Smoking status: Never Smoker  . Smokeless tobacco: Never Used  Substance Use Topics  . Alcohol use: No  . Drug use: No     Allergies   Lisinopril-hydrochlorothiazide   Review of Systems Review of Systems   Physical Exam Triage Vital Signs ED Triage Vitals [02/13/18 1252]  Enc Vitals Group     BP (!) 194/107     Pulse Rate (!) 128     Resp 20     Temp (!) 101.8 F (38.8 C)     Temp Source Oral     SpO2 100 %     Weight      Height      Head Circumference      Peak Flow      Pain Score      Pain Loc      Pain Edu?      Excl.  in [place]?    No data found.  Updated Vital Signs BP (!) 141/71 (BP Location: Left Arm)   Pulse 91   Temp 100 F (37.8 C) (Oral)   Resp 18   LMP 02/05/2018   SpO2 100%   Visual Acuity Right Eye Distance:   Left Eye Distance:   Bilateral Distance:    Right Eye Near:   Left Eye Near:    Bilateral Near:     Physical Exam  Constitutional: She is oriented to person, place, and time. She appears well-developed and well-nourished. No distress.  HENT:  Head: Normocephalic and atraumatic.  Right Ear: Tympanic membrane, external ear and ear canal normal.  Left Ear: Tympanic membrane, external ear and ear canal normal.  Nose: Nose normal.  Mouth/Throat: Uvula is midline, oropharynx is clear and moist and mucous membranes are normal. No tonsillar exudate.  Eyes: Pupils are equal, round, and reactive to light. Conjunctivae and EOM are normal.  Cardiovascular: Regular rhythm and normal heart sounds. Tachycardia present.  Pulmonary/Chest: Effort normal. She has decreased breath sounds.  Neurological: She is alert and oriented to person, place, and time.  Skin: Skin is warm and dry.     UC Treatments / Results  Labs (all labs ordered are listed, but only abnormal results are displayed) Labs Reviewed  POCT RAPID STREP A    EKG None  Radiology Dg Chest 2 View  Result Date: 02/13/2018 CLINICAL DATA:  Fever and cough for the past 2 days.  Nonsmoker. EXAM: CHEST - 2 VIEW COMPARISON:  Chest x-ray of August 31, 2017 FINDINGS: The lungs are well-expanded. There is no focal infiltrate. The interstitial markings are mildly increased. There is no pleural effusion. The heart and pulmonary vascularity are normal. The mediastinum is normal in width. The trachea is midline. The bony thorax exhibits no acute abnormality. IMPRESSION: Mildly increased interstitial markings may reflect acute bronchitis. No alveolar pneumonia nor CHF. Electronically Signed   By: David  Martinique M.D.   On: 02/13/2018 13:39      Procedures Procedures (including critical care time)  Medications Ordered in UC Medications  acetaminophen (TYLENOL) tablet 975 mg (975 mg Oral Given 02/13/18 1339)  albuterol (PROVENTIL HFA;VENTOLIN HFA) 108 (90  Base) MCG/ACT inhaler 2 puff (2 puffs Inhalation Given 02/13/18 1422)    Initial Impression / Assessment and Plan / UC Course  I have reviewed the triage vital signs and the nursing notes.  Pertinent labs & imaging results that were available during my care of the patient were reviewed by me and considered in my medical decision making (see chart for details).     Fever and tachycardia noted on arrival. Tylenol given. Symptoms started yesterday. Negative rapid strep. Chest xray concerning for bronchitis. Given vitals, body aches, cough, will cover empirically with azithromycin. Use of albuterol as needed, tylenol as needed. Encouraged follow up with PCP in the next 1-2 weeks. Return precautions provided. Patient verbalized understanding and agreeable to plan.   Final Clinical Impressions(s) / UC Diagnoses   Final diagnoses:  Lower respiratory tract infection     Discharge Instructions     Complete course of antibiotics.   Tylenol and/or ibuprofen as needed for pain or fevers.   Push fluids to ensure adequate hydration and keep secretions thin.  Use of inhaler every 4-6 hours as needed for cough, wheezing, or shortness of breath .  Please follow up with your primary care provider for recheck in the next 1-2 weeks.  If develop worsening of symptoms, increased shortness of breath , difficulty breathing, fevers, chest pain please return or go to the Er.     ED Prescriptions    Medication Sig Dispense Auth. Provider   azithromycin (ZITHROMAX) 250 MG tablet Take 2 tablets (500 mg total) by mouth daily for 1 day, THEN 1 tablet (250 mg total) daily for 4 days. 6 tablet Augusto Gamble B, NP   acetaminophen (TYLENOL) 500 MG tablet Take 2 tablets (1,000 mg total) by mouth every  8 (eight) hours as needed. 30 tablet Zigmund Gottron, NP     Controlled Substance Prescriptions [place] Controlled Substance Registry consulted? Not Applicable   Zigmund Gottron, NP 02/13/18 1423

## 2018-02-13 NOTE — Discharge Instructions (Signed)
Complete course of antibiotics.   Tylenol and/or ibuprofen as needed for pain or fevers.   Push fluids to ensure adequate hydration and keep secretions thin.  Use of inhaler every 4-6 hours as needed for cough, wheezing, or shortness of breath .  Please follow up with your primary care provider for recheck in the next 1-2 weeks.  If develop worsening of symptoms, increased shortness of breath , difficulty breathing, fevers, chest pain please return or go to the Er.

## 2018-02-13 NOTE — ED Triage Notes (Signed)
Pt presents with cold symptoms. Nasal drainage, headache, fever and chills and body aches

## 2018-02-15 ENCOUNTER — Telehealth (HOSPITAL_COMMUNITY): Payer: Self-pay

## 2018-02-15 LAB — CULTURE, GROUP A STREP (THRC)

## 2018-02-15 NOTE — Telephone Encounter (Signed)
Culture is positive for non group A Strep germ.  This is a finding of uncertain significance; not the typical 'strep throat' germ.  Pt complains of persistent symptoms.  Treated with Azithromycin per Dr. Mannie Stabile at [facility] visit. Instructed to patient to finish that medication and if she is still having symptoms to call or follow back up with [place]. Verbalized understanding.

## 2018-02-16 ENCOUNTER — Encounter (HOSPITAL_COMMUNITY): Payer: Self-pay | Admitting: Emergency Medicine

## 2018-02-16 ENCOUNTER — Ambulatory Visit (HOSPITAL_COMMUNITY)
Admission: EM | Admit: 2018-02-16 | Discharge: 2018-02-16 | Disposition: A | Discharge status: Home / Self Care | Payer: Managed Care, Other (non HMO) | Attending: Family Medicine | Admitting: Family Medicine

## 2018-02-16 ENCOUNTER — Other Ambulatory Visit: Payer: Self-pay

## 2018-02-16 DIAGNOSIS — J039 Acute tonsillitis, unspecified: Secondary | ICD-10-CM

## 2018-02-16 DIAGNOSIS — J020 Streptococcal pharyngitis: Secondary | ICD-10-CM

## 2018-02-16 MED ORDER — AMOXICILLIN-POT CLAVULANATE 875-125 MG PO TABS: 1.0000 | ORAL_TABLET | Freq: Two times a day (BID) | ORAL | 0 refills | Status: AC

## 2018-02-16 NOTE — ED Provider Notes (Signed)
Christine Oneal   [id number] 02/16/18 Arrival Time: 1194  SUBJECTIVE: History from: patient.  Christine Oneal is a 47 y.o. female who presents with continuing fever and sore throat for 4 days.  No positive sick exposures.  Was seen at the [facility] on 02/13/18 and treated for lower respiratory infection with azithromycin.  Reports temporary improvement in symptoms.  Throat culture also obtained on 02/13/18 showed non-group A strep.  Symptoms are made worse with swallowing.  Complains of fever with tmax of 101 at home, 99.1 in office, chills, rhinorrhea, congestion, and productive cough.  Denies fatigue, wheezing, chest pain, nausea, rash, changes in bowel or bladder habits.    ROS: As per HPI.  Past Medical History:  Diagnosis Date  . Abnormal Pap smear of cervix   . Allergy   . Anemia   . Fibroid   . Hypertension   . Leukocytosis, unspecified 10/18/2013  . Obesity   . Plantar fasciitis    Past Surgical History:  Procedure Laterality Date  . COLPOSCOPY    . TUBAL LIGATION  2001   Allergies  Allergen Reactions  . Lisinopril-Hydrochlorothiazide Anaphylaxis   No current facility-administered medications on file prior to encounter.    Current Outpatient Medications on File Prior to Encounter  Medication Sig Dispense Refill  . acetaminophen (TYLENOL) 500 MG tablet Take 2 tablets (1,000 mg total) by mouth every 8 (eight) hours as needed. 30 tablet 0  . albuterol (PROVENTIL HFA;VENTOLIN HFA) 108 (90 Base) MCG/ACT inhaler Inhale 2 puffs into the lungs every 6 (six) hours as needed for wheezing or shortness of breath. 18 g 1  . amLODipine (NORVASC) 10 MG tablet TAKE 1 TABLET BY MOUTH DAILY 90 tablet 3  . atenolol (TENORMIN) 50 MG tablet Take 1 tablet (50 mg total) by mouth daily. 90 tablet 3  . azithromycin (ZITHROMAX) 250 MG tablet Take 2 tablets (500 mg total) by mouth daily for 1 day, THEN 1 tablet (250 mg total) daily for 4 days. 6 tablet 0  . Iron-FA-B Cmp-C-Biot-Probiotic (FUSION  PLUS) CAPS Take 1 capsule by mouth 2 (two) times daily. 60 capsule 1  . meloxicam (MOBIC) 7.5 MG tablet Take 1 tablet (7.5 mg total) by mouth daily. 30 tablet 0  . methocarbamol (ROBAXIN) 500 MG tablet Take 1 tablet (500 mg total) by mouth 3 (three) times daily. 30 tablet 0  . Vitamin D, Ergocalciferol, (DRISDOL) 50000 units CAPS capsule Take 1 capsule (50,000 Units total) by mouth every 7 (seven) days. 12 capsule 3   Social History   Socioeconomic History  . Marital status: Divorced    Spouse name: Not on file  . Number of children: 4  . Years of education: Not on file  . Highest education level: Not on file  Occupational History  . Occupation: CNA    Employer: [employer]  . Financial resource strain: Not on file  . Food insecurity:    Worry: Not on file    Inability: Not on file  . Transportation needs:    Medical: Not on file    Non-medical: Not on file  Tobacco Use  . Smoking status: Never Smoker  . Smokeless tobacco: Never Used  Substance and Sexual Activity  . Alcohol use: No  . Drug use: No  . Sexual activity: Yes    Partners: Male    Birth control/protection: Surgical    Comment: BTL  Lifestyle  . Physical activity:    Days per week: Not on file  Minutes per session: Not on file  . Stress: Not on file  Relationships  . Social connections:    Talks on phone: Not on file    Gets together: Not on file    Attends religious service: Not on file    Active member of club or organization: Not on file    Attends meetings of clubs or organizations: Not on file    Relationship status: Not on file  . Intimate partner violence:    Fear of current or ex partner: Not on file    Emotionally abused: Not on file    Physically abused: Not on file    Forced sexual activity: Not on file  Other Topics Concern  . Not on file  Social History Narrative   Christine Oneal has 4 children. Three live at home with her.   Family History  Problem Relation Age of Onset  .  Hypertension Mother   . Cervical cancer Mother   . Hypertension Father   . Stroke Father   . Uterine cancer Maternal Grandmother   . Diabetes Sister   . Hypertension Brother   . Hypertension Brother     OBJECTIVE:  Vitals:   02/16/18 1822  BP: (!) 155/95  Pulse: 99  Resp: 18  Temp: 99.1 F (37.3 C)  TempSrc: Oral  SpO2: 100%     General appearance: alert; appears fatigued; speaking in full sentences, tolerating own secretions without difficulty HEENT: Ears: EACs clear, TMs pearly gray with visible cone of light, without erythema; Eyes: PERRL, EOMI grossly; Sinuses nontender to palpation; Nose: clear rhinorrhea; Throat: oropharynx mildly erythematous, tonsils 3+ with moderate white tonsillar exudates, uvula midline Neck: supple without LAD Lungs: CTA bilaterally without adventitious breath sounds Heart: regular rate and rhythm.  Radial pulses 2+ symmetrical bilaterally Skin: warm and dry Psychological: alert and cooperative; normal mood and affect  Labs: Recent Results (from the past 2160 hour(s))  POCT rapid strep A [facility])     Status: None   Collection Time: 02/13/18  1:58 PM  Result Value Ref Range   Streptococcus, Group A Screen (Direct) NEGATIVE NEGATIVE  Culture, group A strep (throat)     Status: None   Collection Time: 02/13/18  2:30 PM  Result Value Ref Range   Specimen Description THROAT    Special Requests      NONE Performed at [facility], [place] [address], [place], [place] [address]    Culture MODERATE STREPTOCOCCUS,BETA HEMOLYTIC NOT GROUP A    Report Status 02/15/2018 FINAL    ASSESSMENT & PLAN:  1. Strep pharyngitis     Meds ordered this encounter  Medications  . amoxicillin-clavulanate (AUGMENTIN) 875-125 MG tablet    Sig: Take 1 tablet by mouth every 12 (twelve) hours for 10 days.    Dispense:  20 tablet    Refill:  0    Order Specific Question:   Supervising Provider    Answer:   Wynona Luna [[id number]]   Strep  culture positive for non-group A strep.   Drink plenty of water and rest Prescribed augmentin 875mg  twice daily for 10 days.  Take as directed and to completion.  Continue OTC medications as needed for symptomatic relief Follow up with PCP if symptoms persist Return or go to ER if you have any new or worsening symptoms   Reviewed expectations re: course of current medical issues. Questions answered. Outlined signs and symptoms indicating need for more acute intervention. Patient verbalized understanding. After Visit  Summary given.        Lestine Box, PA-C 02/16/18 1918

## 2018-02-16 NOTE — ED Triage Notes (Signed)
feeling better than she did on Monday.  Patient says she is running fever , 101.  Patient initially came to ucc for work note extension.  Request declined.  Patient checking in for further evaluation

## 2018-02-16 NOTE — Discharge Instructions (Signed)
Strep culture positive for non-group A strep.   Drink plenty of water and rest Prescribed augmentin 875mg  twice daily for 10 days.  Take as directed and to completion.  Continue OTC medications as needed for symptomatic relief Follow up with PCP if symptoms persist Return or go to ER if you have any new or worsening symptoms

## 2018-03-25 ENCOUNTER — Ambulatory Visit (HOSPITAL_COMMUNITY)
Admission: EM | Admit: 2018-03-25 | Discharge: 2018-03-25 | Disposition: A | Discharge status: Home / Self Care | Payer: Managed Care, Other (non HMO) | Attending: Internal Medicine | Admitting: Internal Medicine

## 2018-03-25 ENCOUNTER — Encounter (HOSPITAL_COMMUNITY): Payer: Self-pay | Admitting: Emergency Medicine

## 2018-03-25 DIAGNOSIS — R319 Hematuria, unspecified: Secondary | ICD-10-CM

## 2018-03-25 DIAGNOSIS — S39012A Strain of muscle, fascia and tendon of lower back, initial encounter: Secondary | ICD-10-CM

## 2018-03-25 DIAGNOSIS — M545 Low back pain: Secondary | ICD-10-CM | POA: Diagnosis not present

## 2018-03-25 LAB — POCT URINALYSIS DIP (DEVICE)
Bilirubin Urine: NEGATIVE
Glucose, UA: NEGATIVE mg/dL
KETONES UR: NEGATIVE mg/dL
LEUKOCYTES UA: NEGATIVE
Nitrite: NEGATIVE
PH: 7 (ref 5.0–8.0)
PROTEIN: NEGATIVE mg/dL
Specific Gravity, Urine: 1.025 (ref 1.005–1.030)
Urobilinogen, UA: 1 mg/dL (ref 0.0–1.0)

## 2018-03-25 MED ORDER — NAPROXEN 375 MG PO TABS: 375.0000 mg | ORAL_TABLET | Freq: Two times a day (BID) | ORAL | 0 refills | Status: DC | PRN

## 2018-03-25 MED ORDER — CYCLOBENZAPRINE HCL 10 MG PO TABS: 10.0000 mg | ORAL_TABLET | Freq: Every day | ORAL | 0 refills | Status: DC

## 2018-03-25 NOTE — Discharge Instructions (Signed)
Urine did show trace blood in urine. Please follow up with PCP to recheck urine Continue conservative management of rest, ice, heat, and gentle stretches Take naproxen as needed for pain relief (may cause abdominal discomfort, ulcers, and GI bleeds avoid taking with other NSAIDs) Take cyclobenzaprine at nighttime for symptomatic relief. Avoid driving or operating heavy machinery while using medication. Follow up with PCP if symptoms persist Return or go to the ER if you have any new or worsening symptoms (fever, chills, chest pain, abdominal pain, changes in bowel or bladder habits, pain radiating into lower legs, etc...)

## 2018-03-25 NOTE — ED Triage Notes (Signed)
Pt states for the last few days her L arm has been hurting, hurts to bend at the elbow. No injruy. Pt also c/o bilateral flank pain.

## 2018-03-25 NOTE — ED Provider Notes (Addendum)
[facility]   [id number] 03/25/18 Arrival Time: 3235  CC: Low back pain  SUBJECTIVE: History from: patient. Christine Oneal is a 47 y.o. female hx significant for HTN, obesity, tubal ligation complains of low back that began 1 day ago.  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 Localizes the pain to the low back.  Describes the pain as constant.  Pain level is 3/10.  Has tried OTC medications ibuprofen with minimal relief.  Symptoms are made worse with twisting motions.  Denies similar symptoms in the past.  Denies fever, chills, erythema, ecchymosis, effusion, saddle paresthesias, urinary symptoms, weakness, numbness or tingling.      ROS: As per HPI.  Past Medical History:  Diagnosis Date  . Abnormal Pap smear of cervix   . Allergy   . Anemia   . Fibroid   . Hypertension   . Leukocytosis, unspecified 10/18/2013  . Obesity   . Plantar fasciitis    Past Surgical History:  Procedure Laterality Date  . COLPOSCOPY    . TUBAL LIGATION  2001   Allergies  Allergen Reactions  . Lisinopril-Hydrochlorothiazide Anaphylaxis   No current facility-administered medications on file prior to encounter.    Current Outpatient Medications on File Prior to Encounter  Medication Sig Dispense Refill  . acetaminophen (TYLENOL) 500 MG tablet Take 2 tablets (1,000 mg total) by mouth every 8 (eight) hours as needed. 30 tablet 0  . albuterol (PROVENTIL HFA;VENTOLIN HFA) 108 (90 Base) MCG/ACT inhaler Inhale 2 puffs into the lungs every 6 (six) hours as needed for wheezing or shortness of breath. 18 g 1  . amLODipine (NORVASC) 10 MG tablet TAKE 1 TABLET BY MOUTH DAILY 90 tablet 3  . atenolol (TENORMIN) 50 MG tablet Take 1 tablet (50 mg total) by mouth daily. 90 tablet 3  . Iron-FA-B Cmp-C-Biot-Probiotic (FUSION PLUS) CAPS Take 1 capsule by mouth 2 (two) times daily. 60 capsule 1  . Vitamin D, Ergocalciferol,  (DRISDOL) 50000 units CAPS capsule Take 1 capsule (50,000 Units total) by mouth every 7 (seven) days. 12 capsule 3   Social History   Socioeconomic History  . Marital status: Divorced    Spouse name: Not on file  . Number of children: 4  . Years of education: Not on file  . Highest education level: Not on file  Occupational History  . Occupation: CNA    Employer: [employer]  . Financial resource strain: Not on file  . Food insecurity:    Worry: Not on file    Inability: Not on file  . Transportation needs:    Medical: Not on file    Non-medical: Not on file  Tobacco Use  . Smoking status: Never Smoker  . Smokeless tobacco: Never Used  Substance and Sexual Activity  . Alcohol use: No  . Drug use: No  . Sexual activity: Yes    Partners: Male    Birth control/protection: Surgical    Comment: BTL  Lifestyle  . Physical activity:    Days per week: Not on file    Minutes per session: Not on file  . Stress: Not on file  Relationships  . Social connections:    Talks on phone: Not on file    Gets together: Not on file    Attends religious service: Not on file    Active member of club or organization: Not on file  Attends meetings of clubs or organizations: Not on file    Relationship status: Not on file  . Intimate partner violence:    Fear of current or ex partner: Not on file    Emotionally abused: Not on file    Physically abused: Not on file    Forced sexual activity: Not on file  Other Topics Concern  . Not on file  Social History Narrative   [name] has 4 children. Three live at home with her.   Family History  Problem Relation Age of Onset  . Hypertension Mother   . Cervical cancer Mother   . Hypertension Father   . Stroke Father   . Uterine cancer Maternal Grandmother   . Diabetes Sister   . Hypertension Brother   . Hypertension Brother     OBJECTIVE:  Vitals:   03/25/18 1638 03/25/18 1639  BP:  (!) 165/71  Pulse: 78   Resp: 16     Temp: 98.4 F (36.9 C)   SpO2: 100%     General appearance: AOx3; in no acute distress; nontoxic appearance Head: NCAT Lungs: CTA bilaterally Heart: RRR.  Clear S1 and S2 without murmur, gallops, or rubs.  Radial pulses 2+ bilaterally. Abdomen: protuberant, soft, nondistended, normal active bowel sounds, nontender to palpation, nontender at McBurneys point, negative Murphy's, no rebound or guarding Back: no CVA tenderness Musculoskeletal: Lumbar spine Inspection: Skin warm, dry, clear and intact without obvious erythema, effusion, or ecchymosis.  Palpation: Tender to palpation over the bilateral paravertebral muscles of the lumbar spine ROM: FROM active and passive Strength: 5/5 shld abduction, 5/5 shld adduction, 5/5 elbow flexion, 5/5 elbow extension, 5/5 grip strength, 5/5 hip flexion, 5/5 knee abduction, 5/5 knee adduction, 5/5 knee flexion, 5/5 knee extension, 5/5 dorsiflexion, 5/5 plantar flexion Skin: warm and dry Neurologic: Ambulates without difficulty; Sensation intact about the upper/ lower extremities Psychological: alert and cooperative; normal mood and affect  Results for orders placed or performed during the hospital encounter of 03/25/18 (from the past 24 hour(s))  POCT urinalysis dip (device)     Status: Abnormal   Collection Time: 03/25/18  5:23 PM  Result Value Ref Range   Glucose, UA NEGATIVE NEGATIVE mg/dL   Bilirubin Urine NEGATIVE NEGATIVE   Ketones, ur NEGATIVE NEGATIVE mg/dL   Specific Gravity, Urine 1.025 1.005 - 1.030   Hgb urine dipstick TRACE (A) NEGATIVE   pH 7.0 5.0 - 8.0   Protein, ur NEGATIVE NEGATIVE mg/dL   Urobilinogen, UA 1.0 0.0 - 1.0 mg/dL   Nitrite NEGATIVE NEGATIVE   Leukocytes, UA NEGATIVE NEGATIVE    ASSESSMENT & PLAN:  1. Low back strain, initial encounter     No orders of the defined types were placed in this encounter.  Urine did show trace blood in urine. Please follow up with PCP to recheck urine Continue conservative  management of rest, ice, heat, and gentle stretches Take naproxen as needed for pain relief (may cause abdominal discomfort, ulcers, and GI bleeds avoid taking with other NSAIDs) Take cyclobenzaprine at nighttime for symptomatic relief. Avoid driving or operating heavy machinery while using medication. Follow up with PCP if symptoms persist Return or go to the ER if you have any new or worsening symptoms (fever, chills, chest pain, abdominal pain, changes in bowel or bladder habits, pain radiating into lower legs, etc...)   Reviewed expectations re: course of current medical issues. Questions answered. Outlined signs and symptoms indicating need for more acute intervention. Patient verbalized understanding. After Visit Summary given.  Lestine Box, PA-C 03/25/18 1540

## 2018-04-02 ENCOUNTER — Other Ambulatory Visit: Payer: Self-pay

## 2018-04-02 ENCOUNTER — Emergency Department (HOSPITAL_COMMUNITY)
Admission: EM | Admit: 2018-04-02 | Discharge: 2018-04-02 | Disposition: A | Discharge status: Home / Self Care | Payer: Managed Care, Other (non HMO) | Attending: Emergency Medicine | Admitting: Emergency Medicine

## 2018-04-02 ENCOUNTER — Encounter (HOSPITAL_COMMUNITY): Payer: Self-pay | Admitting: Emergency Medicine

## 2018-04-02 DIAGNOSIS — I10 Essential (primary) hypertension: Secondary | ICD-10-CM | POA: Insufficient documentation

## 2018-04-02 DIAGNOSIS — N898 Other specified noninflammatory disorders of vagina: Secondary | ICD-10-CM | POA: Insufficient documentation

## 2018-04-02 DIAGNOSIS — J45909 Unspecified asthma, uncomplicated: Secondary | ICD-10-CM | POA: Insufficient documentation

## 2018-04-02 DIAGNOSIS — Z79899 Other long term (current) drug therapy: Secondary | ICD-10-CM | POA: Diagnosis not present

## 2018-04-02 DIAGNOSIS — M79605 Pain in left leg: Secondary | ICD-10-CM | POA: Insufficient documentation

## 2018-04-02 LAB — URINALYSIS, ROUTINE W REFLEX MICROSCOPIC
Bilirubin Urine: NEGATIVE
Glucose, UA: NEGATIVE mg/dL
Hgb urine dipstick: NEGATIVE
Ketones, ur: NEGATIVE mg/dL
LEUKOCYTES UA: NEGATIVE
NITRITE: NEGATIVE
PH: 8 (ref 5.0–8.0)
Protein, ur: NEGATIVE mg/dL
SPECIFIC GRAVITY, URINE: 1.013 (ref 1.005–1.030)

## 2018-04-02 LAB — WET PREP, GENITAL
CLUE CELLS WET PREP: NONE SEEN
SPERM: NONE SEEN
TRICH WET PREP: NONE SEEN
Yeast Wet Prep HPF POC: NONE SEEN

## 2018-04-02 MED ORDER — AZITHROMYCIN 250 MG PO TABS
1000.0000 mg | ORAL_TABLET | Freq: Once | ORAL | Status: AC
Start: 2018-04-02 — End: 2018-04-02
  Administered 2018-04-02: 1000 mg via ORAL
  Filled 2018-04-02: qty 4

## 2018-04-02 MED ORDER — CEFTRIAXONE SODIUM 250 MG IJ SOLR
250.0000 mg | Freq: Once | INTRAMUSCULAR | Status: AC
  Administered 2018-04-02: 250 mg via INTRAMUSCULAR
  Filled 2018-04-02: qty 250

## 2018-04-02 MED ORDER — STERILE WATER FOR INJECTION IJ SOLN
INTRAMUSCULAR | Status: AC
  Filled 2018-04-02: qty 10

## 2018-04-02 MED ORDER — STERILE WATER FOR INJECTION IJ SOLN
0.9000 mL | Freq: Once | INTRAMUSCULAR | Status: AC
  Administered 2018-04-02: 0.9 mL via INTRAMUSCULAR

## 2018-04-02 NOTE — Discharge Instructions (Addendum)
Your leg pain is likely coming from your varicose veins. You can use Tylenol and/or Ibuprofen for the pain. I would recommend that you start wearing compressive stockings/hose to help with the swelling.  Your urine did not show any signs of a urinary tract infection. Your wet prep (the swab) was also normal and did not show any yeast or bacteria. I have included information on vaginal itching and things at home you can do to help with it.  Please follow-up with your PCP on these issues.  Thank you for allowing me to take care of you today.

## 2018-04-02 NOTE — ED Provider Notes (Signed)
[facility] EMERGENCY DEPARTMENT Provider Note  CSN: [id number] Arrival date & time: 04/02/18  1817    History   Chief Complaint Chief Complaint  Patient presents with  . Leg Pain  . Vaginal Itching    HPI Christine Oneal is a 47 y.o. female with a medical history of HTN and reactive airway disease who presented to the ED for left leg pain. Patient states that she noticed an area of swelling below her knee this morning and states that area feels sore. Denies recent traumas, falls or injuries. Denies color or temperature change in extremity, weakness or paresthesia. She is able to ambulate without any pain or assistance. Denies chest pain, SOB, palpitations. Patient has tried nothing prior to coming to the ED. Pain is worse when touching the area and relief at rest.  Patient also complains of vaginal itching x1 week. Associated symptom: urinary frequency. Denies fever, abdominal pain, N/V, vaginal discharge, vaginal pain or bleeding.   Past Medical History:  Diagnosis Date  . Abnormal Pap smear of cervix   . Allergy   . Anemia   . Fibroid   . Hypertension   . Leukocytosis, unspecified 10/18/2013  . Obesity   . Plantar fasciitis     Patient Active Problem List   Diagnosis Date Noted  . Anemia 09/04/2017  . BMI 50.0-59.9, adult ([place]) 09/03/2017  . Reactive airway disease 09/03/2017  . Vitamin D deficiency 09/03/2017  . Leukocytosis 10/18/2013  . Essential hypertension 10/24/2007  . EXTERNAL HEMORRHOIDS 10/24/2007  . ANAL FISSURE 10/24/2007    Past Surgical History:  Procedure Laterality Date  . COLPOSCOPY    . TUBAL LIGATION  2001     OB History    Gravida  4   Para  4   Term  4   Preterm      AB      Living  4     SAB      TAB      Ectopic      Multiple      Live Births               Home Medications    Prior to Admission medications   Medication Sig Start Date End Date Taking? Authorizing Provider  acetaminophen  (TYLENOL) 500 MG tablet Take 2 tablets (1,000 mg total) by mouth every 8 (eight) hours as needed. 02/13/18   Zigmund Gottron, NP  albuterol (PROVENTIL HFA;VENTOLIN HFA) 108 (90 Base) MCG/ACT inhaler Inhale 2 puffs into the lungs every 6 (six) hours as needed for wheezing or shortness of breath. 09/03/17   Harrison Mons, PA  amLODipine (NORVASC) 10 MG tablet TAKE 1 TABLET BY MOUTH DAILY 09/03/17   Harrison Mons, PA  atenolol (TENORMIN) 50 MG tablet Take 1 tablet (50 mg total) by mouth daily. 09/03/17   Harrison Mons, PA  cyclobenzaprine (FLEXERIL) 10 MG tablet Take 1 tablet (10 mg total) by mouth at bedtime. 03/25/18   Wurst, [place], PA-C  Iron-FA-B Cmp-C-Biot-Probiotic (FUSION PLUS) CAPS Take 1 capsule by mouth 2 (two) times daily. 06/21/17   Regina Eck, CNM  naproxen (NAPROSYN) 375 MG tablet Take 1 tablet (375 mg total) by mouth 2 (two) times daily as needed for mild pain or moderate pain. 03/25/18   Wurst, [place], PA-C  Vitamin D, Ergocalciferol, (DRISDOL) 50000 units CAPS capsule Take 1 capsule (50,000 Units total) by mouth every 7 (seven) days. 09/03/17   Harrison Mons, PA    Family History  Family History  Problem Relation Age of Onset  . Hypertension Mother   . Cervical cancer Mother   . Hypertension Father   . Stroke Father   . Uterine cancer Maternal Grandmother   . Diabetes Sister   . Hypertension Brother   . Hypertension Brother     Social History Social History   Tobacco Use  . Smoking status: Never Smoker  . Smokeless tobacco: Never Used  Substance Use Topics  . Alcohol use: No  . Drug use: No     Allergies   Lisinopril-hydrochlorothiazide   Review of Systems Review of Systems  Constitutional: Negative for chills and fever.  Respiratory: Negative for cough, choking, chest tightness and shortness of breath.   Cardiovascular: Negative for chest pain, palpitations and leg swelling.  Gastrointestinal: Negative for abdominal pain, nausea and vomiting.    Genitourinary: Positive for frequency. Negative for dysuria, hematuria, vaginal bleeding, vaginal discharge and vaginal pain.       Vaginal itching  Musculoskeletal: Negative.   Skin: Negative.   Neurological: Negative.   Hematological: Does not bruise/bleed easily.  Psychiatric/Behavioral: Negative.    Physical Exam Updated Vital Signs BP (!) 151/94   Pulse (!) 106   Temp 99.4 F (37.4 C)   Resp 18   LMP 03/02/2018   SpO2 100%   Physical Exam  Constitutional: She appears well-developed and well-nourished.  Obese.  Cardiovascular: Normal rate, regular rhythm and normal heart sounds.  No murmur heard. Pulses:      Dorsalis pedis pulses are 2+ on the right side, and 2+ on the left side.       Posterior tibial pulses are 2+ on the right side, and 2+ on the left side.  Pulmonary/Chest: Effort normal and breath sounds normal.  Abdominal: Soft. Normal appearance and bowel sounds are normal. There is no tenderness.  Genitourinary: Vagina normal. Pelvic exam was performed with patient prone. There is no rash or tenderness on the right labia. There is no rash or tenderness on the left labia. Cervix exhibits no discharge and no friability. No erythema, tenderness or bleeding in the vagina. No vaginal discharge found.  Musculoskeletal:       Legs: Full ROM in lower extremities bilaterally with 5/5 strength. No swelling appreciated. Varicose veins bilaterally.  Neurological: She has normal strength. She displays no atrophy. No sensory deficit. She exhibits normal muscle tone.  Unable to assess DTRs due to body habitus.  Skin: Skin is warm. Capillary refill takes less than 2 seconds.  Nursing note and vitals reviewed.   ED Treatments / Results  Labs (all labs ordered are listed, but only abnormal results are displayed) Labs Reviewed  WET PREP, GENITAL - Abnormal; Notable for the following components:      Result Value   WBC, Wet Prep HPF POC FEW (*)    All other components within  normal limits  URINALYSIS, ROUTINE W REFLEX MICROSCOPIC - Abnormal; Notable for the following components:   APPearance CLOUDY (*)    All other components within normal limits  RPR  HIV ANTIBODY (ROUTINE TESTING W REFLEX)  GC/CHLAMYDIA PROBE AMP ([place]) NOT AT [facility]    EKG None  Radiology No results found.  Procedures Procedures (including critical care time)  Medications Ordered in ED Medications  cefTRIAXone (ROCEPHIN) injection 250 mg (250 mg Intramuscular Given 04/02/18 2105)  azithromycin (ZITHROMAX) tablet 1,000 mg (1,000 mg Oral Given 04/02/18 2104)  sterile water (preservative free) injection 0.9 mL (0.9 mLs Intramuscular Given 04/02/18 2106)  Initial Impression / Assessment and Plan / ED Course  Triage vital signs and the nursing notes have been reviewed.  Pertinent labs & imaging results that were available during care of the patient were reviewed and considered in medical decision making (see chart for details).  Patient is in no distress and well appearing. Patient has full sensation in lower extremities bilaterally. She also has full active and passive ROM. No deformities, decreased muscle tone or other abnormalities visualized. Neurovascular function is intact. Physical exam is reassuring. She endorses anterior lower leg pain soreness over area where there is varicose veins. There are no other physical exam findings or s/s that suggest an underlying infectious or rheumatologic process that warrant further evaluation or intervention today. No trauma/injury/fall, deformity or bony tenderness that warrants imaging.  GU exam is unremarkable. Will order UA and wet prep for further evaluation for vaginal itching. Patient requested STI testing.  Clinical Course as of Apr 02 2144  Nancy Fetter Apr 02, 2018  2056 UA not suggestive of UTI.   [GM]  2136 Wet prep unremarkable.   [GM]    Clinical Course User Index [GM] Mortis, Jonelle Sports, PA-C     Final Clinical  Impressions(s) / ED Diagnoses  1. Left Leg Pain. Education provided on OTC and supportive treatment for pain relief. Advised to wear compression hoses for swelling and varicose veins. Advised to follow-up with PCP as scheduled. 2. Vaginal Itching. UA and wet prep unremarkable. Received Zithromax and Rocephin for prophylactic treatment of gonorrhea and chlamydia. Education on appropriate vaginal hygiene given.  Dispo: Home. After thorough clinical evaluation, this patient is determined to be medically stable and can be safely discharged with the previously mentioned treatment and/or outpatient follow-up/referral(s). At this time, there are no other apparent medical conditions that require further screening, evaluation or treatment.  Final diagnoses:  Left leg pain  Vaginal itching    ED Discharge Orders    None        Junita Push 04/02/18 2145    Malvin Johns, MD 04/02/18 2355

## 2018-04-02 NOTE — ED Triage Notes (Addendum)
Pt here for eval of left leg pain from below the knee down to the foot. She has swelling underneath the knee. No pain to posterior knee or calf. Pt also reports vaginal itching.

## 2018-04-03 LAB — RPR: RPR Ser Ql: NONREACTIVE

## 2018-04-03 LAB — GC/CHLAMYDIA PROBE AMP (~~LOC~~) NOT AT ARMC
Chlamydia: NEGATIVE
Neisseria Gonorrhea: NEGATIVE

## 2018-04-04 LAB — HIV ANTIBODY (ROUTINE TESTING W REFLEX): HIV SCREEN 4TH GENERATION: NONREACTIVE

## 2018-05-21 ENCOUNTER — Encounter (HOSPITAL_COMMUNITY): Payer: Self-pay | Admitting: Emergency Medicine

## 2018-05-21 ENCOUNTER — Ambulatory Visit (HOSPITAL_COMMUNITY)
Admission: EM | Admit: 2018-05-21 | Discharge: 2018-05-21 | Disposition: A | Discharge status: Home / Self Care | Payer: Managed Care, Other (non HMO) | Attending: Family Medicine | Admitting: Family Medicine

## 2018-05-21 DIAGNOSIS — J209 Acute bronchitis, unspecified: Secondary | ICD-10-CM

## 2018-05-21 MED ORDER — BENZONATATE 100 MG PO CAPS: 100.0000 mg | ORAL_CAPSULE | Freq: Three times a day (TID) | ORAL | 0 refills | Status: DC

## 2018-05-21 MED ORDER — ALBUTEROL SULFATE HFA 108 (90 BASE) MCG/ACT IN AERS
INHALATION_SPRAY | RESPIRATORY_TRACT | Status: AC
  Filled 2018-05-21: qty 6.7

## 2018-05-21 MED ORDER — ALBUTEROL SULFATE HFA 108 (90 BASE) MCG/ACT IN AERS
2.0000 | INHALATION_SPRAY | Freq: Once | RESPIRATORY_TRACT | Status: AC
  Administered 2018-05-21: 2 via RESPIRATORY_TRACT

## 2018-05-21 NOTE — ED Provider Notes (Signed)
[place]   [id number] 05/21/18 Arrival Time: 9924   CC: Cough  SUBJECTIVE: History from: patient.  Christine Oneal is a 47 y.o. female who presents with abrupt onset of productive cough with white sputum that began 1 day ago.  Denies positive sick exposure or precipitating event.  Has tried OTC medications like ibuprofen with relief.  Symptoms are made worse with lying down at night.  Reports previous symptoms in the past. Complains of subjective fever, chills, body aches, SOB, and mild wheezes. Denies sinus pain, rhinorrhea, sore throat,  chest pain, nausea, changes in bowel or bladder habits.    Received flu shot this year: no.  Denies hx of asthma and tobacco use  ROS: As per HPI.  Past Medical History:  Diagnosis Date  . Abnormal Pap smear of cervix   . Allergy   . Anemia   . Fibroid   . Hypertension   . Leukocytosis, unspecified 10/18/2013  . Obesity   . Plantar fasciitis    Past Surgical History:  Procedure Laterality Date  . COLPOSCOPY    . TUBAL LIGATION  2001   Allergies  Allergen Reactions  . Lisinopril-Hydrochlorothiazide Anaphylaxis   No current facility-administered medications on file prior to encounter.    Current Outpatient Medications on File Prior to Encounter  Medication Sig Dispense Refill  . acetaminophen (TYLENOL) 500 MG tablet Take 2 tablets (1,000 mg total) by mouth every 8 (eight) hours as needed. 30 tablet 0  . albuterol (PROVENTIL HFA;VENTOLIN HFA) 108 (90 Base) MCG/ACT inhaler Inhale 2 puffs into the lungs every 6 (six) hours as needed for wheezing or shortness of breath. 18 g 1  . amLODipine (NORVASC) 10 MG tablet TAKE 1 TABLET BY MOUTH DAILY 90 tablet 3  . atenolol (TENORMIN) 50 MG tablet Take 1 tablet (50 mg total) by mouth daily. 90 tablet 3  . cyclobenzaprine (FLEXERIL) 10 MG tablet Take 1 tablet (10 mg total) by mouth at bedtime. 12 tablet 0  . Iron-FA-B Cmp-C-Biot-Probiotic (FUSION PLUS) CAPS Take 1 capsule by mouth 2  (two) times daily. 60 capsule 1  . naproxen (NAPROSYN) 375 MG tablet Take 1 tablet (375 mg total) by mouth 2 (two) times daily as needed for mild pain or moderate pain. 20 tablet 0  . Vitamin D, Ergocalciferol, (DRISDOL) 50000 units CAPS capsule Take 1 capsule (50,000 Units total) by mouth every 7 (seven) days. 12 capsule 3   Social History   Socioeconomic History  . Marital status: Divorced    Spouse name: Not on file  . Number of children: 4  . Years of education: Not on file  . Highest education level: Not on file  Occupational History  . Occupation: CNA    Employer: [employer]  . Financial resource strain: Not on file  . Food insecurity:    Worry: Not on file    Inability: Not on file  . Transportation needs:    Medical: Not on file    Non-medical: Not on file  Tobacco Use  . Smoking status: Never Smoker  . Smokeless tobacco: Never Used  Substance and Sexual Activity  . Alcohol use: No  . Drug use: No  . Sexual activity: Yes    Partners: Male    Birth control/protection: Surgical    Comment: BTL  Lifestyle  . Physical activity:    Days per week: Not on file    Minutes per session: Not on file  . Stress: Not on file  Relationships  . Social connections:    Talks on phone: Not on file    Gets together: Not on file    Attends religious service: Not on file    Active member of club or organization: Not on file    Attends meetings of clubs or organizations: Not on file    Relationship status: Not on file  . Intimate partner violence:    Fear of current or ex partner: Not on file    Emotionally abused: Not on file    Physically abused: Not on file    Forced sexual activity: Not on file  Other Topics Concern  . Not on file  Social History Narrative   Christine Oneal has 4 children. Three live at home with her.   Family History  Problem Relation Age of Onset  . Hypertension Mother   . Cervical cancer Mother   . Hypertension Father   . Stroke Father   .  Uterine cancer Maternal Grandmother   . Diabetes Sister   . Hypertension Brother   . Hypertension Brother     OBJECTIVE:  Vitals:   05/21/18 1655  BP: (!) 163/87  Pulse: 95  Resp: 18  Temp: 99 F (37.2 C)  TempSrc: Oral  SpO2: 95%     General appearance: alert; appears fatigued, but nontoxic; speaking in full sentences and tolerating own secretions HEENT: NCAT; Ears: EACs clear, TMs pearly gray; Eyes: PERRL.  EOM grossly intact. Sinuses: nontender; Nose: nares patent without rhinorrhea, Throat: oropharynx clear, tonsils non erythematous or enlarged, uvula midline  Neck: supple without LAD Lungs: unlabored respirations, symmetrical air entry; cough: mild; no respiratory distress; CTAB Heart: regular rate and rhythm.  Radial pulses 2+ symmetrical bilaterally Skin: warm and dry Psychological: alert and cooperative; normal mood and affect  ASSESSMENT & PLAN:  1. Acute bronchitis, unspecified organism     No orders of the defined types were placed in this encounter.  Inhaler given in office.  Use as needed for shortness of breath and/or wheezing Get plenty of rest and push fluids Tessalon Perles prescribed for cough Use OTC medications like ibuprofen or tylenol as needed fever or pain Follow up with PCP if symptoms persist Return or go to ER if you have any new or worsening symptoms fever, chills, nausea, vomiting, chest pain, cough, shortness of breath, wheezing, abdominal pain, changes in bowel or bladder habits, etc...  Reviewed expectations re: course of current medical issues. Questions answered. Outlined signs and symptoms indicating need for more acute intervention. Patient verbalized understanding. After Visit Summary given.         Lestine Box, PA-C 05/21/18 1739

## 2018-05-21 NOTE — Discharge Instructions (Addendum)
Inhaler given in office.  Use as needed for shortness of breath and/or wheezing Get plenty of rest and push fluids Tessalon Perles prescribed for cough Use OTC medications like ibuprofen or tylenol as needed fever or pain Follow up with PCP if symptoms persist Return or go to ER if you have any new or worsening symptoms fever, chills, nausea, vomiting, chest pain, cough, shortness of breath, wheezing, abdominal pain, changes in bowel or bladder habits, etc..Marland Kitchen

## 2018-05-21 NOTE — ED Triage Notes (Signed)
Pt sts URI sx x 2 days

## 2018-06-29 ENCOUNTER — Ambulatory Visit: Payer: PRIVATE HEALTH INSURANCE | Admitting: Certified Nurse Midwife

## 2018-08-16 ENCOUNTER — Encounter (HOSPITAL_COMMUNITY): Payer: Self-pay | Admitting: Emergency Medicine

## 2018-08-16 ENCOUNTER — Ambulatory Visit (HOSPITAL_COMMUNITY)
Admission: EM | Admit: 2018-08-16 | Discharge: 2018-08-16 | Disposition: A | Discharge status: Home / Self Care | Payer: Managed Care, Other (non HMO) | Attending: Emergency Medicine | Admitting: Emergency Medicine

## 2018-08-16 DIAGNOSIS — B349 Viral infection, unspecified: Secondary | ICD-10-CM | POA: Diagnosis not present

## 2018-08-16 MED ORDER — IPRATROPIUM BROMIDE 0.06 % NA SOLN: 2.0000 | Freq: Four times a day (QID) | NASAL | 0 refills | Status: DC

## 2018-08-16 MED ORDER — PREDNISONE 50 MG PO TABS: 50.0000 mg | ORAL_TABLET | Freq: Every day | ORAL | 0 refills | Status: DC

## 2018-08-16 NOTE — ED Provider Notes (Signed)
[place]    CSN: [id number] Arrival date & time: 08/16/18  [place]     History   Chief Complaint Chief Complaint  Patient presents with  . Flu-Like Symptoms    HPI Christine Oneal is a 48 y.o. female.   48 year old female comes in for 3-day history of URI symptoms.  Has a cough, rhinorrhea, nasal congestion, body aches, chills, fevers.  States T-max 101.5, and has not had a fever today.  She has had some dyspnea on exertion.  Denies shortness of breath or wheezing at rest.  Had diarrhea 3 days ago that has since resolved.  Denies abdominal pain, nausea, vomiting.  Has been eating and drinking without difficulty.  Never smoker.  OTC cold medication with mild relief.     Past Medical History:  Diagnosis Date  . Abnormal Pap smear of cervix   . Allergy   . Anemia   . Fibroid   . Hypertension   . Leukocytosis, unspecified 10/18/2013  . Obesity   . Plantar fasciitis     Patient Active Problem List   Diagnosis Date Noted  . Anemia 09/04/2017  . BMI 50.0-59.9, adult ([place]) 09/03/2017  . Reactive airway disease 09/03/2017  . Vitamin D deficiency 09/03/2017  . Leukocytosis 10/18/2013  . Essential hypertension 10/24/2007  . EXTERNAL HEMORRHOIDS 10/24/2007  . ANAL FISSURE 10/24/2007    Past Surgical History:  Procedure Laterality Date  . COLPOSCOPY    . TUBAL LIGATION  2001    OB History    Gravida  4   Para  4   Term  4   Preterm      AB      Living  4     SAB      TAB      Ectopic      Multiple      Live Births               Home Medications    Prior to Admission medications   Medication Sig Start Date End Date Taking? Authorizing Provider  acetaminophen (TYLENOL) 500 MG tablet Take 2 tablets (1,000 mg total) by mouth every 8 (eight) hours as needed. 02/13/18   Zigmund Gottron, NP  albuterol (PROVENTIL HFA;VENTOLIN HFA) 108 (90 Base) MCG/ACT inhaler Inhale 2 puffs into the lungs every 6 (six) hours as needed for wheezing or  shortness of breath. 09/03/17   Harrison Mons, PA  amLODipine (NORVASC) 10 MG tablet TAKE 1 TABLET BY MOUTH DAILY 09/03/17   Harrison Mons, PA  atenolol (TENORMIN) 50 MG tablet Take 1 tablet (50 mg total) by mouth daily. 09/03/17   Harrison Mons, PA  ipratropium (ATROVENT) 0.06 % nasal spray Place 2 sprays into both nostrils 4 (four) times daily. 08/16/18   Yu, Amy V, PA-C  Iron-FA-B Cmp-C-Biot-Probiotic (FUSION PLUS) CAPS Take 1 capsule by mouth 2 (two) times daily. 06/21/17   Regina Eck, CNM  predniSONE (DELTASONE) 50 MG tablet Take 1 tablet (50 mg total) by mouth daily. 08/16/18   Tasia Catchings, Amy V, PA-C  Vitamin D, Ergocalciferol, (DRISDOL) 50000 units CAPS capsule Take 1 capsule (50,000 Units total) by mouth every 7 (seven) days. 09/03/17   Harrison Mons, PA    Family History Family History  Problem Relation Age of Onset  . Hypertension Mother   . Cervical cancer Mother   . Hypertension Father   . Stroke Father   . Uterine cancer Maternal Grandmother   . Diabetes Sister   .  Hypertension Brother   . Hypertension Brother     Social History Social History   Tobacco Use  . Smoking status: Never Smoker  . Smokeless tobacco: Never Used  Substance Use Topics  . Alcohol use: No  . Drug use: No     Allergies   Lisinopril-hydrochlorothiazide   Review of Systems Review of Systems  Reason unable to perform ROS: See HPI as above.     Physical Exam Triage Vital Signs ED Triage Vitals [08/16/18 1936]  Enc Vitals Group     BP (!) 155/80     Pulse Rate 89     Resp 20     Temp 98.9 F (37.2 C)     Temp Source Temporal     SpO2 100 %     Weight      Height      Head Circumference      Peak Flow      Pain Score 9     Pain Loc      Pain Edu?      Excl. in [place]?    No data found.  Updated Vital Signs BP (!) 155/80 (BP Location: Right Wrist)   Pulse 89   Temp 98.9 F (37.2 C) (Temporal)   Resp 20   LMP 07/19/2018 (Approximate)   SpO2 100%   Physical  Exam Constitutional:      General: She is not in acute distress.    Appearance: She is well-developed. She is not ill-appearing, toxic-appearing or diaphoretic.  HENT:     Head: Normocephalic and atraumatic.     Right Ear: Tympanic membrane, ear canal and external ear normal. Tympanic membrane is not erythematous or bulging.     Left Ear: Tympanic membrane, ear canal and external ear normal. Tympanic membrane is not erythematous or bulging.     Nose: Congestion and rhinorrhea present.     Right Sinus: No maxillary sinus tenderness or frontal sinus tenderness.     Left Sinus: No maxillary sinus tenderness or frontal sinus tenderness.     Mouth/Throat:     Pharynx: Uvula midline.  Eyes:     Conjunctiva/sclera: Conjunctivae normal.     Pupils: Pupils are equal, round, and reactive to light.  Neck:     Musculoskeletal: Normal range of motion and neck supple.  Cardiovascular:     Rate and Rhythm: Normal rate and regular rhythm.     Heart sounds: Normal heart sounds. No murmur. No friction rub. No gallop.   Pulmonary:     Effort: Pulmonary effort is normal. No respiratory distress.     Breath sounds: Normal breath sounds. No stridor. No decreased breath sounds, wheezing, rhonchi or rales.  Lymphadenopathy:     Cervical: No cervical adenopathy.  Skin:    General: Skin is warm and dry.  Neurological:     Mental Status: She is alert and oriented to person, place, and time.  Psychiatric:        Behavior: Behavior normal.        Judgment: Judgment normal.      UC Treatments / Results  Labs (all labs ordered are listed, but only abnormal results are displayed) Labs Reviewed - No data to display  EKG None  Radiology No results found.  Procedures Procedures (including critical care time)  Medications Ordered in UC Medications - No data to display  Initial Impression / Assessment and Plan / UC Course  I have reviewed the triage vital signs and the nursing notes.  Pertinent  labs & imaging results that were available during my care of the patient were reviewed by me and considered in my medical decision making (see chart for details).    Discussed with patient history and exam most consistent with viral URI. Symptomatic treatment as needed. Push fluids. Return precautions given.   Final Clinical Impressions(s) / UC Diagnoses   Final diagnoses:  Viral illness    ED Prescriptions    Medication Sig Dispense Auth. Provider   predniSONE (DELTASONE) 50 MG tablet Take 1 tablet (50 mg total) by mouth daily. 5 tablet Yu, Amy V, PA-C   ipratropium (ATROVENT) 0.06 % nasal spray Place 2 sprays into both nostrils 4 (four) times daily. 15 mL Tobin Chad, [place] 08/16/18 2013

## 2018-08-16 NOTE — Discharge Instructions (Signed)
prednisone for cough. Start atrovent nasal spray for nasal congestion/drainage. You can use over the counter nasal saline rinse such as neti pot for nasal congestion. Keep hydrated, your urine should be clear to pale yellow in color. Tylenol/motrin for fever and pain. Monitor for any worsening of symptoms, chest pain, shortness of breath, wheezing, swelling of the throat, follow up for reevaluation.   For sore throat/cough try using a honey-based tea. Use 3 teaspoons of honey with juice squeezed from half lemon. Place shaved pieces of ginger into 1/2-1 cup of water and warm over stove top. Then mix the ingredients and repeat every 4 hours as needed.

## 2018-08-16 NOTE — ED Triage Notes (Signed)
Pt presents to [facility] for assessment of cough, congestion, fevers (101.5 at home) body aches, shortness of breath with exertion, diarrhea.

## 2018-12-01 ENCOUNTER — Other Ambulatory Visit: Payer: Self-pay

## 2018-12-01 ENCOUNTER — Ambulatory Visit
Admission: EM | Admit: 2018-12-01 | Discharge: 2018-12-01 | Disposition: A | Discharge status: Home / Self Care | Payer: Managed Care, Other (non HMO) | Attending: Family Medicine | Admitting: Family Medicine

## 2018-12-01 ENCOUNTER — Encounter: Payer: Self-pay | Admitting: Family Medicine

## 2018-12-01 DIAGNOSIS — M791 Myalgia, unspecified site: Secondary | ICD-10-CM | POA: Diagnosis not present

## 2018-12-01 DIAGNOSIS — G5601 Carpal tunnel syndrome, right upper limb: Secondary | ICD-10-CM | POA: Diagnosis not present

## 2018-12-01 MED ORDER — PREDNISONE 10 MG PO TABS: 10.0000 mg | ORAL_TABLET | Freq: Every day | ORAL | 0 refills | Status: DC

## 2018-12-01 NOTE — Discharge Instructions (Addendum)
Also start Vitamin C chewables 500 mg twice daily

## 2018-12-01 NOTE — ED Provider Notes (Signed)
[facility]    CSN: [id number] Arrival date & time: 12/01/18  1516     History   Chief Complaint Chief Complaint  Patient presents with  . Back Pain    HPI Christine Oneal is a 48 y.o. female.   48 yo woman presents for the first time to [place].  She is complaining of myalgia.  She works a [employer]arry manager She has been developing some upper thoracic soreness over a year, left shoulder soreness and right stiffness in wrist.  No fever, cough, abdominal pain or nausea, diarrhea, rash, sore throat, eye problems     Note from Feb 35, 3426: 48 year old female comes in for 3-day history of URI symptoms.  Has a cough, rhinorrhea, nasal congestion, body aches, chills, fevers.  States T-max 101.5, and has not had a fever today.  She has had some dyspnea on exertion.  Denies shortness of breath or wheezing at rest.  Had diarrhea 3 days ago that has since resolved.  Denies abdominal pain, nausea, vomiting.  Has been eating and drinking without difficulty.  Never smoker.  OTC cold medication with mild relief.       Past Medical History:  Diagnosis Date  . Abnormal Pap smear of cervix   . Allergy   . Anemia   . Fibroid   . Hypertension   . Leukocytosis, unspecified 10/18/2013  . Obesity   . Plantar fasciitis     Patient Active Problem List   Diagnosis Date Noted  . Anemia 09/04/2017  . BMI 50.0-59.9, adult ([place]) 09/03/2017  . Reactive airway disease 09/03/2017  . Vitamin D deficiency 09/03/2017  . Leukocytosis 10/18/2013  . Essential hypertension 10/24/2007  . EXTERNAL HEMORRHOIDS 10/24/2007  . ANAL FISSURE 10/24/2007    Past Surgical History:  Procedure Laterality Date  . COLPOSCOPY    . TUBAL LIGATION  2001    OB History    Gravida  4   Para  4   Term  4   Preterm      AB      Living  4     SAB      TAB      Ectopic      Multiple      Live Births               Home Medications    Prior to Admission medications   Medication Sig  Start Date End Date Taking? Authorizing Provider  acetaminophen (TYLENOL) 500 MG tablet Take 2 tablets (1,000 mg total) by mouth every 8 (eight) hours as needed. 02/13/18   Zigmund Gottron, NP  albuterol (PROVENTIL HFA;VENTOLIN HFA) 108 (90 Base) MCG/ACT inhaler Inhale 2 puffs into the lungs every 6 (six) hours as needed for wheezing or shortness of breath. 09/03/17   Harrison Mons, PA  amLODipine (NORVASC) 10 MG tablet TAKE 1 TABLET BY MOUTH DAILY 09/03/17   Harrison Mons, PA  atenolol (TENORMIN) 50 MG tablet Take 1 tablet (50 mg total) by mouth daily. 09/03/17   Harrison Mons, PA  ipratropium (ATROVENT) 0.06 % nasal spray Place 2 sprays into both nostrils 4 (four) times daily. 08/16/18   Yu, Amy V, PA-C  Iron-FA-B Cmp-C-Biot-Probiotic (FUSION PLUS) CAPS Take 1 capsule by mouth 2 (two) times daily. 06/21/17   Regina Eck, CNM  predniSONE (DELTASONE) 10 MG tablet Take 1 tablet (10 mg total) by mouth daily with breakfast. 12/01/18   [name], MD  Vitamin D, Ergocalciferol, (DRISDOL) 50000 units CAPS  capsule Take 1 capsule (50,000 Units total) by mouth every 7 (seven) days. 09/03/17   Harrison Mons, PA    Family History Family History  Problem Relation Age of Onset  . Hypertension Mother   . Cervical cancer Mother   . Hypertension Father   . Stroke Father   . Uterine cancer Maternal Grandmother   . Diabetes Sister   . Hypertension Brother   . Hypertension Brother     Social History Social History   Tobacco Use  . Smoking status: Never Smoker  . Smokeless tobacco: Never Used  Substance Use Topics  . Alcohol use: No  . Drug use: No     Allergies   Lisinopril-hydrochlorothiazide   Review of Systems Review of Systems  Musculoskeletal: Positive for myalgias.  All other systems reviewed and are negative.    Physical Exam Triage Vital Signs ED Triage Vitals  Enc Vitals Group     BP      Pulse      Resp      Temp      Temp src      SpO2      Weight       Height      Head Circumference      Peak Flow      Pain Score      Pain Loc      Pain Edu?      Excl. in [place]?    No data found.  Updated Vital Signs BP (!) 153/86 (BP Location: Left Arm)   Pulse 91   Temp 98.9 F (37.2 C) (Oral)   Resp 18   LMP 11/10/2018   SpO2 97%    Physical Exam Vitals signs and nursing note reviewed.  Constitutional:      Appearance: Normal appearance. She is obese.     Comments: Morbidly obese  HENT:     Mouth/Throat:     Mouth: Mucous membranes are moist.  Eyes:     Conjunctiva/sclera: Conjunctivae normal.  Neck:     Musculoskeletal: Normal range of motion and neck supple.  Pulmonary:     Effort: Pulmonary effort is normal.  Musculoskeletal: Normal range of motion.  Skin:    General: Skin is warm and dry.  Neurological:     General: No focal deficit present.     Mental Status: She is alert.     Gait: Gait normal.  Psychiatric:        Mood and Affect: Mood normal.        Thought Content: Thought content normal.      UC Treatments / Results  Labs (all labs ordered are listed, but only abnormal results are displayed) Labs Reviewed - No data to display  EKG None  Radiology No results found.  Procedures Procedures (including critical care time)  Medications Ordered in UC Medications - No data to display  Initial Impression / Assessment and Plan / UC Course  I have reviewed the triage vital signs and the nursing notes.  Pertinent labs & imaging results that were available during my care of the patient were reviewed by me and considered in my medical decision making (see chart for details).    Final Clinical Impressions(s) / UC Diagnoses   Final diagnoses:  Myalgia  Carpal tunnel syndrome of right wrist     Discharge Instructions     Also start Vitamin C chewables 500 mg twice daily    ED Prescriptions    Medication Sig Dispense Auth.  Provider   predniSONE (DELTASONE) 10 MG tablet Take 1 tablet (10 mg total) by  mouth daily with breakfast. 5 tablet [name], MD     Controlled Substance Prescriptions [place] Controlled Substance Registry consulted? Not Applicable   [name], MD 12/01/18 1558

## 2018-12-01 NOTE — ED Triage Notes (Signed)
Pt c/o mid back pain, lt shoulder pain, and rt hand pain. Denies injury states been going on for a long time.

## 2018-12-21 ENCOUNTER — Other Ambulatory Visit: Payer: Self-pay

## 2018-12-21 ENCOUNTER — Ambulatory Visit
Admission: EM | Admit: 2018-12-21 | Discharge: 2018-12-21 | Disposition: A | Discharge status: Home / Self Care | Payer: Managed Care, Other (non HMO) | Attending: Family Medicine | Admitting: Family Medicine

## 2018-12-21 DIAGNOSIS — G8929 Other chronic pain: Secondary | ICD-10-CM | POA: Diagnosis not present

## 2018-12-21 DIAGNOSIS — M546 Pain in thoracic spine: Secondary | ICD-10-CM | POA: Diagnosis not present

## 2018-12-21 DIAGNOSIS — I10 Essential (primary) hypertension: Secondary | ICD-10-CM

## 2018-12-21 MED ORDER — MELOXICAM 7.5 MG PO TABS: 7.5000 mg | ORAL_TABLET | Freq: Every day | ORAL | 1 refills | Status: DC

## 2018-12-21 MED ORDER — METHOCARBAMOL 500 MG PO TABS: 500.0000 mg | ORAL_TABLET | Freq: Two times a day (BID) | ORAL | 0 refills | Status: DC

## 2018-12-21 NOTE — Discharge Instructions (Signed)
Take the medication as prescribed Primary care contact put on papers Physical therapy may be beneficial.

## 2018-12-21 NOTE — ED Provider Notes (Signed)
[facility]    CSN: [id number] Arrival date & time: 12/21/18  1500     History   Chief Complaint Chief Complaint  Patient presents with  . Back Pain    HPI Christine Oneal is a 48 y.o. female.   Is a 48 year old female presents today with chronic right upper back pain.  This is been present, waxing and waning since 12/19.  Chronic in nature. She has been seen multiple times for this problem.  She was seen here on 12/01/2018 and prescribed prednisone for 5 days.  Reports this helped with her wrist pain but did not help with her back pain.  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  This aggravates her symptoms. No associated numbness,tingling, weakness. No fever or rashes.   ROS per HPI      Past Medical History:  Diagnosis Date  . Abnormal Pap smear of cervix   . Allergy   . Anemia   . Fibroid   . Hypertension   . Leukocytosis, unspecified 10/18/2013  . Obesity   . Plantar fasciitis     Patient Active Problem List   Diagnosis Date Noted  . Anemia 09/04/2017  . BMI 50.0-59.9, adult ([place]) 09/03/2017  . Reactive airway disease 09/03/2017  . Vitamin D deficiency 09/03/2017  . Leukocytosis 10/18/2013  . Essential hypertension 10/24/2007  . EXTERNAL HEMORRHOIDS 10/24/2007  . ANAL FISSURE 10/24/2007    Past Surgical History:  Procedure Laterality Date  . COLPOSCOPY    . TUBAL LIGATION  2001    OB History    Gravida  4   Para  4   Term  4   Preterm      AB      Living  4     SAB      TAB      Ectopic      Multiple      Live Births               Home Medications    Prior to Admission medications   Medication Sig Start Date End Date Taking? Authorizing Provider  acetaminophen (TYLENOL) 500 MG tablet Take 2 tablets (1,000 mg total) by mouth every 8 (eight) hours as needed. 02/13/18   Zigmund Gottron, NP  albuterol (PROVENTIL HFA;VENTOLIN HFA) 108 (90 Base) MCG/ACT inhaler Inhale 2 puffs into the lungs every 6 (six)  hours as needed for wheezing or shortness of breath. 09/03/17   Harrison Mons, PA  amLODipine (NORVASC) 10 MG tablet TAKE 1 TABLET BY MOUTH DAILY 09/03/17   Harrison Mons, PA  atenolol (TENORMIN) 50 MG tablet Take 1 tablet (50 mg total) by mouth daily. 09/03/17   Harrison Mons, PA  ipratropium (ATROVENT) 0.06 % nasal spray Place 2 sprays into both nostrils 4 (four) times daily. 08/16/18   Yu, Amy V, PA-C  Iron-FA-B Cmp-C-Biot-Probiotic (FUSION PLUS) CAPS Take 1 capsule by mouth 2 (two) times daily. 06/21/17   Regina Eck, CNM  meloxicam (MOBIC) 7.5 MG tablet Take 1 tablet (7.5 mg total) by mouth daily. 12/21/18   Loura Halt A, NP  methocarbamol (ROBAXIN) 500 MG tablet Take 1 tablet (500 mg total) by mouth 2 (two) times daily. 12/21/18   Orvan July, NP  Vitamin D, Ergocalciferol, (DRISDOL) 50000 units CAPS capsule Take 1 capsule (50,000 Units total) by mouth every 7 (seven) days. 09/03/17   Harrison Mons, PA    Family History Family History  Problem Relation Age of  Onset  . Hypertension Mother   . Cervical cancer Mother   . Hypertension Father   . Stroke Father   . Uterine cancer Maternal Grandmother   . Diabetes Sister   . Hypertension Brother   . Hypertension Brother     Social History Social History   Tobacco Use  . Smoking status: Never Smoker  . Smokeless tobacco: Never Used  Substance Use Topics  . Alcohol use: No  . Drug use: No     Allergies   Lisinopril-hydrochlorothiazide   Review of Systems Review of Systems   Physical Exam Triage Vital Signs ED Triage Vitals  Enc Vitals Group     BP 12/21/18 1512 (!) 166/105     Pulse Rate 12/21/18 1512 99     Resp 12/21/18 1512 16     Temp 12/21/18 1512 98.6 F (37 C)     Temp Source 12/21/18 1512 Oral     SpO2 12/21/18 1512 98 %     Weight --      Height --      Head Circumference --      Peak Flow --      Pain Score 12/21/18 1514 7     Pain Loc --      Pain Edu? --      Excl. in [place]? --    No data  found.  Updated Vital Signs BP (!) 166/105 (BP Location: Left Wrist)   Pulse 99   Temp 98.6 F (37 C) (Oral)   Resp 16   LMP 12/07/2018   SpO2 98%   Visual Acuity Right Eye Distance:   Left Eye Distance:   Bilateral Distance:    Right Eye Near:   Left Eye Near:    Bilateral Near:     Physical Exam Vitals signs and nursing note reviewed.  Constitutional:      Appearance: Normal appearance.  HENT:     Head: Normocephalic and atraumatic.     Nose: Nose normal.  Eyes:     Conjunctiva/sclera: Conjunctivae normal.  Neck:     Musculoskeletal: Normal range of motion.  Pulmonary:     Effort: Pulmonary effort is normal.  Musculoskeletal: Normal range of motion.     Right shoulder: She exhibits no tenderness.     Thoracic back: She exhibits tenderness. She exhibits normal range of motion, no bony tenderness, no swelling, no edema, no deformity, no laceration, no pain and no spasm.       Back:       Arms:  Skin:    General: Skin is warm and dry.  Neurological:     Mental Status: She is alert.     Sensory: No sensory deficit.  Psychiatric:        Mood and Affect: Mood normal.      UC Treatments / Results  Labs (all labs ordered are listed, but only abnormal results are displayed) Labs Reviewed - No data to display  EKG None  Radiology No results found.  Procedures Procedures (including critical care time)  Medications Ordered in UC Medications - No data to display  Initial Impression / Assessment and Plan / UC Course  I have reviewed the triage vital signs and the nursing notes.  Pertinent labs & imaging results that were available during my care of the patient were reviewed by me and considered in my medical decision making (see chart for details).     Chronic back pain Treating with meloxicam and tizanidine .  Contact  given for PCP Recommended physical therapy Follow up as needed for continued or worsening symptoms   Final Clinical Impressions(s)  / UC Diagnoses   Final diagnoses:  Chronic right-sided thoracic back pain     Discharge Instructions     Take the medication as prescribed Primary care contact put on papers Physical therapy may be beneficial.      ED Prescriptions    Medication Sig Dispense Auth. Provider   meloxicam (MOBIC) 7.5 MG tablet Take 1 tablet (7.5 mg total) by mouth daily. 30 tablet Belmira Daley A, NP   methocarbamol (ROBAXIN) 500 MG tablet Take 1 tablet (500 mg total) by mouth 2 (two) times daily. 20 tablet Loura Halt A, NP     Controlled Substance Prescriptions [place] Controlled Substance Registry consulted? Not Applicable   Orvan July, NP 12/21/18 [phone]

## 2018-12-21 NOTE — ED Triage Notes (Signed)
Pt c/o center upper back pain since 12/19, denies injury.

## 2019-01-01 ENCOUNTER — Ambulatory Visit (INDEPENDENT_AMBULATORY_CARE_PROVIDER_SITE_OTHER): Payer: Managed Care, Other (non HMO) | Admitting: Family Medicine

## 2019-01-01 ENCOUNTER — Other Ambulatory Visit: Payer: Self-pay

## 2019-01-01 DIAGNOSIS — G8929 Other chronic pain: Secondary | ICD-10-CM | POA: Diagnosis not present

## 2019-01-01 DIAGNOSIS — M546 Pain in thoracic spine: Secondary | ICD-10-CM

## 2019-01-01 DIAGNOSIS — I10 Essential (primary) hypertension: Secondary | ICD-10-CM | POA: Diagnosis not present

## 2019-01-01 NOTE — Progress Notes (Deleted)
Called patient to initiate their telephone visit with provider Molli Barrows, FNP-C. Verified date of birth. C/o mid back pain x 6 months. No known injury. Does work as a Quarry manager doing heavy lifting. Current pain 3/10. KWalker, CMA.

## 2019-01-01 NOTE — Progress Notes (Signed)
Virtual Visit via Telephone Note I connected with Christine Oneal on 01/01/19 at  2:10 PM EDT by telephone and verified that I am speaking with the correct person using two identifiers.  Location: Patient: Located at home during today's encounter  Provider: Located at primary care office     I discussed the limitations, risks, security and privacy concerns of performing an evaluation and management service by telephone and the availability of in person appointments. I also discussed with the patient that there may be a patient responsible charge related to this service. The patient expressed understanding and agreed to proceed.   History of Present Illness:  Chronic back pain Patient presents chronic mid right-sided back problems. No injury.  Symptoms of back pain have been present for several years dating back to 2019.  Patient was referred to orthopedic specialty by her previous PCP however never followed up.   Reports that back pain has worsened.  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 The pain is localized to the mid right-sided back and is nonradiating. She denies any weakness in her legs and/or numbness and tingling of extremities. Pain improves with rest although sitting for prolonged periods can incite back pain. Treatment include occasionally takes NSAID with mild temporary relief of pain. Recently prescribed methocarbamol and Mobic which she reports has not been taking either as she fears medication will cause drowsiness.  Hypertension Christine Oneal doesn't monitor blood pressure at home. Not consistently taking medication. Reports only taking amlodipine and atenolol as she is afraid of medication reactions when taking two BP medications. Patient has a history of anaphylaxis associated with lisinopril HCTZ therapy.     Assessment and Plan: 1. Chronic right-sided thoracic back pain -Continue OTC ibuprofen as needed. - Ambulatory referral to Orthopedic  Surgery  2. Essential hypertension, recently poorly controlled per reading at urgent care.  Explained that prior drug reaction was not related to Atenolol and she is fine to take both BP medications concurrently. Given recent blood pressure readings, recommend resuming both. Patient reports that she has both medications as home.  Follow Up Instructions: 2-3 weeks for screening labs and hypertension management.   I discussed the assessment and treatment plan with the patient. The patient was provided an opportunity to ask questions and all were answered. The patient agreed with the plan and demonstrated an understanding of the instructions.   The patient was advised to call back or seek an in-person evaluation if the symptoms worsen or if the condition fails to improve as anticipated.  I provided 30 minutes of non-face-to-face time during this encounter.   Molli Barrows, FNP-C  Primary Care at [facility] [address] [place] [phone]: [phone]

## 2019-01-10 ENCOUNTER — Ambulatory Visit: Payer: PRIVATE HEALTH INSURANCE | Admitting: Family Medicine

## 2019-01-12 ENCOUNTER — Telehealth: Payer: Managed Care, Other (non HMO) | Admitting: Family

## 2019-01-12 DIAGNOSIS — J029 Acute pharyngitis, unspecified: Secondary | ICD-10-CM | POA: Diagnosis not present

## 2019-01-12 NOTE — Progress Notes (Signed)
Greater than 5 minutes, yet less than 10 minutes of time have been spent researching, coordinating, and implementing care for this patient today.  Thank you for the details you included in the comment boxes. Those details are very helpful in determining the best course of treatment for you and help [place] to provide the best care.  We are sorry that you are not feeling well.  Here is how we plan to help!  Your symptoms indicate a likely viral infection (Pharyngitis).   Pharyngitis is inflammation in the back of the throat which can cause a sore throat, scratchiness and sometimes difficulty swallowing.   Pharyngitis is typically caused by a respiratory virus and will just run its course.  Please keep in mind that your symptoms could last up to 10 days.  For throat pain, we recommend over the counter oral pain relief medications such as acetaminophen or aspirin, or anti-inflammatory medications such as ibuprofen or naproxen sodium.  Topical treatments such as oral throat lozenges or sprays may be used as needed.  Avoid close contact with loved ones, especially the very young and elderly.  Remember to wash your hands thoroughly throughout the day as this is the number one way to prevent the spread of infection and wipe down door knobs and counters with disinfectant.  After careful review of your answers, I would not recommend and antibiotic for your condition.  Antibiotics should not be used to treat conditions that we suspect are caused by viruses like the virus that causes the common cold or flu. However, some people can have Strep with atypical symptoms. You may need formal testing in clinic or office to confirm if your symptoms continue or worsen.  Providers prescribe antibiotics to treat infections caused by bacteria. Antibiotics are very powerful in treating bacterial infections when they are used properly.  To maintain their effectiveness, they should be used only when necessary.  Overuse of antibiotics  has resulted in the development of super bugs that are resistant to treatment!    Home Care:  Only take medications as instructed by your medical team.  Do not drink alcohol while taking these medications.  A steam or ultrasonic humidifier can help congestion.  You can place a towel over your head and breathe in the steam from hot water coming from a faucet.  Avoid close contacts especially the very young and the elderly.  Cover your mouth when you cough or sneeze.  Always remember to wash your hands.  Get Help Right Away If:  You develop worsening fever or throat pain.  You develop a severe head ache or visual changes.  Your symptoms persist after you have completed your treatment plan.  Make sure you  Understand these instructions.  Will watch your condition.  Will get help right away if you are not doing well or get worse.  Your e-visit answers were reviewed by a board certified advanced clinical practitioner to complete your personal care plan.  Depending on the condition, your plan could have included both over the counter or prescription medications.  If there is a problem please reply  once you have received a response from your provider.  Your safety is important to [place].  If you have drug allergies check your prescription carefully.    You can use MyChart to ask questions about todays visit, request a non-urgent call back, or ask for a work or school excuse for 24 hours related to this e-Visit. If it has been greater than 24 hours  you will need to follow up with your provider, or enter a new e-Visit to address those concerns.  You will get an e-mail in the next two days asking about your experience.  I hope that your e-visit has been valuable and will speed your recovery. Thank you for using e-visits.

## 2019-01-13 ENCOUNTER — Telehealth: Payer: Managed Care, Other (non HMO) | Admitting: Family

## 2019-01-13 ENCOUNTER — Encounter: Payer: Self-pay | Admitting: Family

## 2019-01-13 DIAGNOSIS — R05 Cough: Secondary | ICD-10-CM

## 2019-01-13 DIAGNOSIS — R053 Chronic cough: Secondary | ICD-10-CM

## 2019-01-13 NOTE — Progress Notes (Signed)
Based on what you shared with me, I feel your condition warrants further evaluation and I recommend that you be seen for a face to face office visit.  NOTE: If you entered your credit card information for this eVisit, you will not be charged. You may see a "hold" on your card for the $35 but that hold will drop off and you will not have a charge processed.  If you are having a true medical emergency please call 911.     For an urgent face to face visit, [facility] has five urgent care centers for your convenience:    [email or web address] to reserve your spot online an avoid wait times  [facility] [address] [place], [place] [address] Modified hours of operation: Monday-Friday, 12 PM to 6 PM  Closed Saturday & Sunday  *Across the street from [place] (New Address!) [address] [place], [place] [address] *Just off [address], across the road from [place] hours of operation: Monday-Friday, 12 PM to 6 PM  Closed Saturday & Sunday   The following sites will take your insurance:  . [facility]    [phone]                  Get Driving Directions  [address], [place] [address] . 10 am to 8 pm Monday-Friday . 12 pm to 8 pm Saturday-Sunday   . [facility] at [place]                  Get Driving Directions  [address], [place], [place] [address] . 8 am to 8 pm Monday-Friday . 9 am to 6 pm Saturday . 11 am to 6 pm Sunday   . [facility] at [place]                  Get Driving Directions   [address] [place], [place] [address] . 8 am to 8 pm Monday-Friday . 8 am to 4 pm Saturday-Sunday    . [facility] at                     Get Driving Directions  [phone]  [address] [place], [place] [address]   . Monday-Friday, 12 PM to 6 PM    Your e-visit answers were reviewed by a board certified advanced clinical practitioner to complete your personal care plan.  Thank you for using e-Visits.

## 2019-01-14 ENCOUNTER — Encounter (HOSPITAL_COMMUNITY): Payer: Self-pay

## 2019-01-14 ENCOUNTER — Other Ambulatory Visit: Payer: Self-pay

## 2019-01-14 ENCOUNTER — Ambulatory Visit (HOSPITAL_COMMUNITY)
Admission: EM | Admit: 2019-01-14 | Discharge: 2019-01-14 | Disposition: A | Discharge status: Home / Self Care | Payer: Managed Care, Other (non HMO) | Attending: Family Medicine | Admitting: Family Medicine

## 2019-01-14 DIAGNOSIS — J029 Acute pharyngitis, unspecified: Secondary | ICD-10-CM | POA: Diagnosis present

## 2019-01-14 LAB — POCT RAPID STREP A: Streptococcus, Group A Screen (Direct): NEGATIVE

## 2019-01-14 MED ORDER — AMOXICILLIN-POT CLAVULANATE 875-125 MG PO TABS: 1.0000 | ORAL_TABLET | Freq: Two times a day (BID) | ORAL | 0 refills | Status: AC

## 2019-01-14 NOTE — ED Provider Notes (Signed)
[facility]    CSN: [id number] Arrival date & time: 01/14/19  1552     History   Chief Complaint Chief Complaint  Patient presents with  . Fever  . Cough    HPI Christine Oneal is a 47 y.o. female.   Patient presents today with fever, T-max 102, sore throat, chills, cough productive of yellow phlegm.  She denies difficulty swallowing, rash, vomiting, diarrhea, abdominal pain, dysuria. LMP: 1 week.  She declines COVID testing as she had this done at work last week.   The history is provided by the patient.  Cough Associated symptoms: chills, fever and sore throat   Associated symptoms: no chest pain, no ear pain, no rash and no shortness of breath     Past Medical History:  Diagnosis Date  . Abnormal Pap smear of cervix   . Allergy   . Anemia   . Fibroid   . Hypertension   . Leukocytosis, unspecified 10/18/2013  . Obesity   . Plantar fasciitis     Patient Active Problem List   Diagnosis Date Noted  . Anemia 09/04/2017  . BMI 50.0-59.9, adult ([place]) 09/03/2017  . Reactive airway disease 09/03/2017  . Vitamin D deficiency 09/03/2017  . Leukocytosis 10/18/2013  . Essential hypertension 10/24/2007  . EXTERNAL HEMORRHOIDS 10/24/2007  . ANAL FISSURE 10/24/2007    Past Surgical History:  Procedure Laterality Date  . COLPOSCOPY    . TUBAL LIGATION  2001    OB History    Gravida  4   Para  4   Term  4   Preterm      AB      Living  4     SAB      TAB      Ectopic      Multiple      Live Births               Home Medications    Prior to Admission medications   Medication Sig Start Date End Date Taking? Authorizing Provider  albuterol (PROVENTIL HFA;VENTOLIN HFA) 108 (90 Base) MCG/ACT inhaler Inhale 2 puffs into the lungs every 6 (six) hours as needed for wheezing or shortness of breath. 09/03/17   Harrison Mons, PA  amLODipine (NORVASC) 10 MG tablet TAKE 1 TABLET BY MOUTH DAILY 09/03/17   Harrison Mons, PA   amoxicillin-clavulanate (AUGMENTIN) 875-125 MG tablet Take 1 tablet by mouth every 12 (twelve) hours for 10 days. 01/14/19 01/24/19  Sharion Balloon, NP  atenolol (TENORMIN) 50 MG tablet Take 1 tablet (50 mg total) by mouth daily. 09/03/17   Harrison Mons, PA  meloxicam (MOBIC) 7.5 MG tablet Take 1 tablet (7.5 mg total) by mouth daily. 12/21/18   Loura Halt A, NP  methocarbamol (ROBAXIN) 500 MG tablet Take 1 tablet (500 mg total) by mouth 2 (two) times daily. 12/21/18   Orvan July, NP    Family History Family History  Problem Relation Age of Onset  . Hypertension Mother   . Cervical cancer Mother   . Hypertension Father   . Stroke Father   . Uterine cancer Maternal Grandmother   . Diabetes Sister   . Hypertension Brother   . Hypertension Brother     Social History Social History   Tobacco Use  . Smoking status: Never Smoker  . Smokeless tobacco: Never Used  Substance Use Topics  . Alcohol use: No  . Drug use: No     Allergies   Lisinopril-hydrochlorothiazide  and Lisinopril   Review of Systems Review of Systems  Constitutional: Positive for chills and fever.  HENT: Positive for sore throat. Negative for ear pain and trouble swallowing.   Eyes: Negative for pain and visual disturbance.  Respiratory: Positive for cough. Negative for shortness of breath.   Cardiovascular: Negative for chest pain and palpitations.  Gastrointestinal: Negative for abdominal pain and vomiting.  Genitourinary: Negative for dysuria and hematuria.  Musculoskeletal: Negative for arthralgias and back pain.  Skin: Negative for color change and rash.  Neurological: Negative for seizures and syncope.  All other systems reviewed and are negative.    Physical Exam Triage Vital Signs ED Triage Vitals  Enc Vitals Group     BP 01/14/19 1614 (!) 143/85     Pulse Rate 01/14/19 1614 (!) 113     Resp 01/14/19 1614 18     Temp 01/14/19 1614 99.1 F (37.3 C)     Temp Source 01/14/19 1614 Oral      SpO2 01/14/19 1614 99 %     Weight --      Height --      Head Circumference --      Peak Flow --      Pain Score 01/14/19 1612 0     Pain Loc --      Pain Edu? --      Excl. in [place]? --    No data found.  Updated Vital Signs BP (!) 143/85 (BP Location: Left Arm)   Pulse (!) 113   Temp 99.1 F (37.3 C) (Oral) Comment: took ibuprofen at 10a today  Resp 18   SpO2 99%   Visual Acuity Right Eye Distance:   Left Eye Distance:   Bilateral Distance:    Right Eye Near:   Left Eye Near:    Bilateral Near:     Physical Exam Vitals signs and nursing note reviewed.  Constitutional:      General: She is not in acute distress.    Appearance: She is well-developed.  HENT:     Head: Normocephalic and atraumatic.     Right Ear: Tympanic membrane normal.     Left Ear: Tympanic membrane normal.     Mouth/Throat:     Mouth: Mucous membranes are moist.     Pharynx: Oropharyngeal exudate and posterior oropharyngeal erythema present.  Eyes:     Conjunctiva/sclera: Conjunctivae normal.  Neck:     Musculoskeletal: Neck supple.  Cardiovascular:     Rate and Rhythm: Normal rate and regular rhythm.     Heart sounds: No murmur.  Pulmonary:     Effort: Pulmonary effort is normal. No respiratory distress.     Breath sounds: Normal breath sounds.  Abdominal:     Palpations: Abdomen is soft.     Tenderness: There is no abdominal tenderness.  Skin:    General: Skin is warm and dry.  Neurological:     Mental Status: She is alert.      UC Treatments / Results  Labs (all labs ordered are listed, but only abnormal results are displayed) Labs Reviewed  CULTURE, GROUP A STREP [facility])  POCT RAPID STREP A    EKG   Radiology No results found.  Procedures Procedures (including critical care time)  Medications Ordered in UC Medications - No data to display  Initial Impression / Assessment and Plan / UC Course  I have reviewed the triage vital signs and the nursing notes.   Pertinent labs & imaging results that were  available during my care of the patient were reviewed by me and considered in my medical decision making (see chart for details).   Acute pharyngitis.  Rapid strep negative today but exam indicates need for antibiotic.  Treating with Augmentin x 10 days. Discussed with patient that she can take Tylenol or Motrin as needed.  Discussed with patient that she can return here or follow-up with her primary care provider or emergency department if she develops difficulty swallowing, rash, shortness of breath, vomiting, diarrhea, abdominal pain, dysuria, other concerning symptoms.   Final Clinical Impressions(s) / UC Diagnoses   Final diagnoses:  Acute pharyngitis, unspecified etiology     Discharge Instructions     Take the antibiotic Augmentin twice daily for 10 days.  Take Tylenol or ibuprofen as needed for pain.   Return here or follow-up with your primary care provider if you develop difficulty swallowing, rash, shortness of breath, vomiting, diarrhea, abdominal pain, dysuria, or other concerning symptoms.     ED Prescriptions    Medication Sig Dispense Auth. Provider   amoxicillin-clavulanate (AUGMENTIN) 875-125 MG tablet Take 1 tablet by mouth every 12 (twelve) hours for 10 days. 20 tablet Sharion Balloon, NP     Controlled Substance Prescriptions [place] Controlled Substance Registry consulted? Not Applicable   Sharion Balloon, NP 01/14/19 1655

## 2019-01-14 NOTE — Discharge Instructions (Signed)
Take the antibiotic Augmentin twice daily for 10 days.  Take Tylenol or ibuprofen as needed for pain.   Return here or follow-up with your primary care provider if you develop difficulty swallowing, rash, shortness of breath, vomiting, diarrhea, abdominal pain, dysuria, or other concerning symptoms.

## 2019-01-14 NOTE — ED Triage Notes (Signed)
Patient presents to Urgent Care with complaints of fever, cough, and chills since last week. Patient reports she did an e-visit and was not happy with the diagnosis of pharyngitis.

## 2019-01-15 ENCOUNTER — Ambulatory Visit: Payer: Managed Care, Other (non HMO) | Admitting: Family Medicine

## 2019-01-16 LAB — CULTURE, GROUP A STREP (THRC)

## 2019-01-17 ENCOUNTER — Emergency Department (HOSPITAL_COMMUNITY)
Admission: EM | Admit: 2019-01-17 | Discharge: 2019-01-17 | Disposition: A | Discharge status: Home / Self Care | Payer: Managed Care, Other (non HMO) | Attending: Emergency Medicine | Admitting: Emergency Medicine

## 2019-01-17 ENCOUNTER — Other Ambulatory Visit: Payer: Self-pay

## 2019-01-17 ENCOUNTER — Ambulatory Visit: Payer: PRIVATE HEALTH INSURANCE | Admitting: Family Medicine

## 2019-01-17 DIAGNOSIS — R070 Pain in throat: Secondary | ICD-10-CM | POA: Diagnosis present

## 2019-01-17 DIAGNOSIS — J029 Acute pharyngitis, unspecified: Secondary | ICD-10-CM | POA: Insufficient documentation

## 2019-01-17 DIAGNOSIS — I10 Essential (primary) hypertension: Secondary | ICD-10-CM | POA: Diagnosis not present

## 2019-01-17 DIAGNOSIS — Z79899 Other long term (current) drug therapy: Secondary | ICD-10-CM | POA: Insufficient documentation

## 2019-01-17 LAB — GROUP A STREP BY PCR: Group A Strep by PCR: NOT DETECTED

## 2019-01-17 MED ORDER — DEXAMETHASONE SODIUM PHOSPHATE 10 MG/ML IJ SOLN
10.0000 mg | Freq: Once | INTRAMUSCULAR | Status: AC
  Administered 2019-01-17: 10 mg via INTRAMUSCULAR
  Filled 2019-01-17: qty 1

## 2019-01-17 NOTE — ED Provider Notes (Signed)
TIME SEEN: 4:05 AM  CHIEF COMPLAINT: Sore throat  HPI: Patient is a 48 year old female with history of hypertension and obesity who presents to the emergency department with 3 days of sore throat.  Was seen in urgent care for the same and had a negative strep test but but was started on amoxicillin.  Reports her throat is still swollen and she has pain with swallowing.  No difficulty speaking, breathing.  She is able to swallow.  No fevers or chills.  Has had dry cough.  No exposures to coronavirus.  ROS: See HPI Constitutional: no fever  Eyes: no drainage  ENT: no runny nose   Cardiovascular:  no chest pain  Resp: no SOB  GI: no vomiting GU: no dysuria Integumentary: no rash  Allergy: no hives  Musculoskeletal: no leg swelling  Neurological: no slurred speech ROS otherwise negative  PAST MEDICAL HISTORY/PAST SURGICAL HISTORY:  Past Medical History:  Diagnosis Date  . Abnormal Pap smear of cervix   . Allergy   . Anemia   . Fibroid   . Hypertension   . Leukocytosis, unspecified 10/18/2013  . Obesity   . Plantar fasciitis     MEDICATIONS:  Prior to Admission medications   Medication Sig Start Date End Date Taking? Authorizing Provider  albuterol (PROVENTIL HFA;VENTOLIN HFA) 108 (90 Base) MCG/ACT inhaler Inhale 2 puffs into the lungs every 6 (six) hours as needed for wheezing or shortness of breath. 09/03/17   Harrison Mons, PA  amLODipine (NORVASC) 10 MG tablet TAKE 1 TABLET BY MOUTH DAILY 09/03/17   Harrison Mons, PA  amoxicillin-clavulanate (AUGMENTIN) 875-125 MG tablet Take 1 tablet by mouth every 12 (twelve) hours for 10 days. 01/14/19 01/24/19  Sharion Balloon, NP  atenolol (TENORMIN) 50 MG tablet Take 1 tablet (50 mg total) by mouth daily. 09/03/17   Harrison Mons, PA  meloxicam (MOBIC) 7.5 MG tablet Take 1 tablet (7.5 mg total) by mouth daily. 12/21/18   Orvan July, NP    ALLERGIES:  Allergies  Allergen Reactions  . Lisinopril-Hydrochlorothiazide Anaphylaxis  .  Lisinopril     Other reaction(s): Angioedema    SOCIAL HISTORY:  Social History   Tobacco Use  . Smoking status: Never Smoker  . Smokeless tobacco: Never Used  Substance Use Topics  . Alcohol use: No    FAMILY HISTORY: Family History  Problem Relation Age of Onset  . Hypertension Mother   . Cervical cancer Mother   . Hypertension Father   . Stroke Father   . Uterine cancer Maternal Grandmother   . Diabetes Sister   . Hypertension Brother   . Hypertension Brother     EXAM: BP (!) 163/102 (BP Location: Right Arm)   Pulse 96   Temp 98.3 F (36.8 C) (Oral)   Resp 16   SpO2 100%  CONSTITUTIONAL: Alert and oriented and responds appropriately to questions. Well-appearing; well-nourished HEAD: Normocephalic EYES: Conjunctivae clear, pupils appear equal, EOMI ENT: normal nose; moist mucous membranes; patient has some mild pharyngeal erythema with bilateral tonsillar hypertrophy and a small amount of exudate noted to the right tonsil, no unilateral swelling, no trismus or drooling, no muffled voice, normal phonation, no stridor, no dental caries present, no drainable dental abscess noted, no Ludwig's angina, tongue sits flat in the bottom of the mouth, no angioedema, no facial erythema or warmth, no facial swelling; no pain with movement of the neck. NECK: Supple, no meningismus, no nuchal rigidity, no LAD  CARD: RRR RESP: Normal chest excursion without  splinting or tachypnea; no hypoxia or respiratory distress, speaking full sentences ABD/GI: Nondistended BACK: Normal range of motion EXT: Normal ROM in all joints; no major deformity noted SKIN: Normal color for age and race; warm; no rash on exposed skin NEURO: Moves all extremities equally PSYCH: The patient's mood and manner are appropriate. Grooming and personal hygiene are appropriate.  MEDICAL DECISION MAKING: Patient here with pharyngitis.  Suspect viral pharyngitis.  Strep test today is again negative.  Have recommended  Decadron for symptom management and she agrees.  She is not a diabetic.  No signs or symptoms to suggest PTA, deep space neck infection, meningitis, pneumonia.  She denies any exposures to COVID-19.  She is already on amoxicillin.  We will have her continue this medication until complete although I have low suspicion for bacterial source of her pharyngitis today.  Recommended alternating Tylenol and Motrin for pain.  She verbalized understanding.   At this time, I do not feel there is any life-threatening condition present. I have reviewed and discussed all results (EKG, imaging, lab, urine as appropriate) and exam findings with patient/family. I have reviewed nursing notes and appropriate previous records.  I feel the patient is safe to be discharged home without further emergent workup and can continue workup as an outpatient as needed. Discussed usual and customary return precautions. Patient/family verbalize understanding and are comfortable with this plan.  Outpatient follow-up has been provided as needed. All questions have been answered.      Gioia Ranes, Delice Bison, DO 01/17/19 [phone]

## 2019-01-17 NOTE — ED Triage Notes (Addendum)
Per pt she has been having a sore throat since Saturday and went to urgent care on Sunday and was given amoxicillin. Pt said strep was negative. Pt says her throat is still sore. No fevers now no chills

## 2019-01-17 NOTE — Discharge Instructions (Addendum)
You may alternate Tylenol 1000 mg every 6 hours as needed for pain and Ibuprofen 800 mg every 8 hours as needed for pain.  Please take Ibuprofen with food. ° °

## 2019-02-16 ENCOUNTER — Telehealth: Payer: Self-pay

## 2019-02-16 NOTE — Telephone Encounter (Signed)
Called patient to do their pre-visit COVID screening.  Call went to full voicemail. Unable to do prescreening.

## 2019-02-19 ENCOUNTER — Ambulatory Visit: Payer: Managed Care, Other (non HMO) | Admitting: Family Medicine

## 2019-03-30 ENCOUNTER — Emergency Department (HOSPITAL_COMMUNITY)
Admission: EM | Admit: 2019-03-30 | Discharge: 2019-03-31 | Disposition: A | Discharge status: Home / Self Care | Payer: Managed Care, Other (non HMO) | Attending: Emergency Medicine | Admitting: Emergency Medicine

## 2019-03-30 ENCOUNTER — Encounter (HOSPITAL_COMMUNITY): Payer: Self-pay | Admitting: Emergency Medicine

## 2019-03-30 ENCOUNTER — Other Ambulatory Visit: Payer: Self-pay

## 2019-03-30 DIAGNOSIS — I10 Essential (primary) hypertension: Secondary | ICD-10-CM | POA: Insufficient documentation

## 2019-03-30 DIAGNOSIS — T7840XA Allergy, unspecified, initial encounter: Secondary | ICD-10-CM

## 2019-03-30 DIAGNOSIS — Z79899 Other long term (current) drug therapy: Secondary | ICD-10-CM | POA: Insufficient documentation

## 2019-03-30 DIAGNOSIS — J029 Acute pharyngitis, unspecified: Secondary | ICD-10-CM | POA: Insufficient documentation

## 2019-03-30 NOTE — ED Triage Notes (Signed)
Pt to ED with c/o sore throat onset earlier today

## 2019-03-31 LAB — GROUP A STREP BY PCR: Group A Strep by PCR: NOT DETECTED

## 2019-03-31 MED ORDER — DIPHENHYDRAMINE HCL 25 MG PO TABS: 25.0000 mg | ORAL_TABLET | Freq: Four times a day (QID) | ORAL | 0 refills | Status: DC

## 2019-03-31 MED ORDER — FAMOTIDINE 20 MG PO TABS: 20.0000 mg | ORAL_TABLET | Freq: Two times a day (BID) | ORAL | 0 refills | Status: DC

## 2019-03-31 MED ORDER — PREDNISONE 20 MG PO TABS: 40.0000 mg | ORAL_TABLET | Freq: Every day | ORAL | 0 refills | Status: DC

## 2019-03-31 MED ORDER — DIPHENHYDRAMINE HCL 25 MG PO CAPS
25.0000 mg | ORAL_CAPSULE | Freq: Once | ORAL | Status: AC
  Administered 2019-03-31: 06:00:00 25 mg via ORAL
  Filled 2019-03-31: qty 1

## 2019-03-31 MED ORDER — FAMOTIDINE 20 MG PO TABS
20.0000 mg | ORAL_TABLET | Freq: Once | ORAL | Status: AC
  Administered 2019-03-31: 20 mg via ORAL
  Filled 2019-03-31: qty 1

## 2019-03-31 MED ORDER — PREDNISONE 20 MG PO TABS
60.0000 mg | ORAL_TABLET | Freq: Once | ORAL | Status: AC
  Administered 2019-03-31: 06:00:00 60 mg via ORAL
  Filled 2019-03-31: qty 3

## 2019-03-31 NOTE — ED Provider Notes (Signed)
[facility] EMERGENCY DEPARTMENT Provider Note   CSN: [id number] Arrival date & time: 03/30/19  2129     History   Chief Complaint Chief Complaint  Patient presents with  . Sore Throat    HPI Christine Oneal is a 48 y.o. female.     Patient presents to the ED with a chief complaint of allergic reaction.  She states that yesterday she broke out in a rash, had some lip tingling and throat scratchiness.  She states that she took a benadryl with some relief.  Reports using new detergent and body wash.  No new foods or medications.  She is not diabetic.  Denies any fever, throat swelling, or difficulty breathing.  The history is provided by the patient. No language interpreter was used.    Past Medical History:  Diagnosis Date  . Abnormal Pap smear of cervix   . Allergy   . Anemia   . Fibroid   . Hypertension   . Leukocytosis, unspecified 10/18/2013  . Obesity   . Plantar fasciitis     Patient Active Problem List   Diagnosis Date Noted  . Anemia 09/04/2017  . BMI 50.0-59.9, adult ([place]) 09/03/2017  . Reactive airway disease 09/03/2017  . Vitamin D deficiency 09/03/2017  . Leukocytosis 10/18/2013  . Essential hypertension 10/24/2007  . EXTERNAL HEMORRHOIDS 10/24/2007  . ANAL FISSURE 10/24/2007    Past Surgical History:  Procedure Laterality Date  . COLPOSCOPY    . TUBAL LIGATION  2001     OB History    Gravida  4   Para  4   Term  4   Preterm      AB      Living  4     SAB      TAB      Ectopic      Multiple      Live Births               Home Medications    Prior to Admission medications   Medication Sig Start Date End Date Taking? Authorizing Provider  albuterol (PROVENTIL HFA;VENTOLIN HFA) 108 (90 Base) MCG/ACT inhaler Inhale 2 puffs into the lungs every 6 (six) hours as needed for wheezing or shortness of breath. 09/03/17   Harrison Mons, PA  amLODipine (NORVASC) 10 MG tablet TAKE 1 TABLET BY MOUTH DAILY 09/03/17    Harrison Mons, PA  atenolol (TENORMIN) 50 MG tablet Take 1 tablet (50 mg total) by mouth daily. 09/03/17   Harrison Mons, PA  diphenhydrAMINE (BENADRYL) 25 MG tablet Take 1 tablet (25 mg total) by mouth every 6 (six) hours. 03/31/19   Montine Circle, PA-C  famotidine (PEPCID) 20 MG tablet Take 1 tablet (20 mg total) by mouth 2 (two) times daily. 03/31/19   Montine Circle, PA-C  meloxicam (MOBIC) 7.5 MG tablet Take 1 tablet (7.5 mg total) by mouth daily. 12/21/18   Loura Halt A, NP  predniSONE (DELTASONE) 20 MG tablet Take 2 tablets (40 mg total) by mouth daily. 03/31/19   Montine Circle, PA-C    Family History Family History  Problem Relation Age of Onset  . Hypertension Mother   . Cervical cancer Mother   . Hypertension Father   . Stroke Father   . Uterine cancer Maternal Grandmother   . Diabetes Sister   . Hypertension Brother   . Hypertension Brother     Social History Social History   Tobacco Use  . Smoking status: Never Smoker  .  Smokeless tobacco: Never Used  Substance Use Topics  . Alcohol use: No  . Drug use: No     Allergies   Lisinopril-hydrochlorothiazide and Lisinopril   Review of Systems Review of Systems  All other systems reviewed and are negative.    Physical Exam Updated Vital Signs BP (!) 175/74 (BP Location: Left Arm)   Pulse 100   Temp 98.8 F (37.1 C) (Oral)   Resp (!) 22   Ht 5\' 3"  (1.6 m)   Wt (!) 144.2 kg   LMP 02/27/2019   SpO2 100%   BMI 56.33 kg/m   Physical Exam Vitals signs and nursing note reviewed.  Constitutional:      General: She is not in acute distress.    Appearance: She is well-developed.  HENT:     Head: Normocephalic and atraumatic.     Mouth/Throat:     Comments: Oropharynx is clear, no stridor, speaks clearly and easily Eyes:     Conjunctiva/sclera: Conjunctivae normal.  Neck:     Musculoskeletal: Neck supple.  Cardiovascular:     Rate and Rhythm: Normal rate and regular rhythm.     Heart sounds:  No murmur.  Pulmonary:     Effort: Pulmonary effort is normal. No respiratory distress.     Breath sounds: Normal breath sounds.  Abdominal:     Palpations: Abdomen is soft.     Tenderness: There is no abdominal tenderness.  Skin:    General: Skin is warm and dry.     Comments: Faint hives on extremities  Neurological:     Mental Status: She is alert.      ED Treatments / Results  Labs (all labs ordered are listed, but only abnormal results are displayed) Labs Reviewed  GROUP A STREP BY PCR    EKG None  Radiology No results found.  Procedures Procedures (including critical care time)  Medications Ordered in ED Medications  predniSONE (DELTASONE) tablet 60 mg (has no administration in time range)  diphenhydrAMINE (BENADRYL) capsule 25 mg (has no administration in time range)  famotidine (PEPCID) tablet 20 mg (has no administration in time range)     Initial Impression / Assessment and Plan / ED Course  I have reviewed the triage vital signs and the nursing notes.  Pertinent labs & imaging results that were available during my care of the patient were reviewed by me and considered in my medical decision making (see chart for details).        Patient with allergic reaction.  Likely to new body wash or detergent. Has some hives. No evidence of anaphylaxis. Will give prednisone, benadryl, and pepcid.   PCP follow-up.  Final Clinical Impressions(s) / ED Diagnoses   Final diagnoses:  Allergic reaction, initial encounter    ED Discharge Orders         Ordered    predniSONE (DELTASONE) 20 MG tablet  Daily     03/31/19 0546    diphenhydrAMINE (BENADRYL) 25 MG tablet  Every 6 hours     03/31/19 0546    famotidine (PEPCID) 20 MG tablet  2 times daily     03/31/19 0546           Montine Circle, PA-C 03/31/19 0553    Horton, Barbette Hair, MD 03/31/19 [phone]

## 2019-03-31 NOTE — Discharge Instructions (Addendum)
It is unclear what exactly caused your allergic reaction.  Please switch back to your normal detergent and body wash.  These can be two common culprits.  Please take medications as prescribed.   Return to the ER for new or worsening symptoms.

## 2019-03-31 NOTE — ED Notes (Signed)
ED Provider at bedside. 

## 2019-03-31 NOTE — ED Notes (Signed)
Pt presented to staff with rash on face and arms.  Pt taken back to triage for reevaluation.

## 2019-04-16 ENCOUNTER — Telehealth: Payer: Self-pay | Admitting: Family Medicine

## 2019-04-16 NOTE — Telephone Encounter (Signed)
Pt is needing refill on blood pressure meds . Can we do courtesy refill pt has 1st available TOC 05/01/2019 please call pr [phone]

## 2019-04-17 ENCOUNTER — Encounter: Payer: Self-pay | Admitting: *Deleted

## 2019-04-17 ENCOUNTER — Other Ambulatory Visit: Payer: Self-pay | Admitting: *Deleted

## 2019-04-17 DIAGNOSIS — I10 Essential (primary) hypertension: Secondary | ICD-10-CM

## 2019-04-17 MED ORDER — AMLODIPINE BESYLATE 10 MG PO TABS: ORAL_TABLET | ORAL | 0 refills | Status: DC

## 2019-04-17 MED ORDER — ATENOLOL 50 MG PO TABS: 50.0000 mg | ORAL_TABLET | Freq: Every day | ORAL | 0 refills | Status: DC

## 2019-04-17 NOTE — Telephone Encounter (Signed)
Message sent [facility] to tell patient 30 day supply sent in for both BP medications

## 2019-05-01 ENCOUNTER — Encounter: Payer: Self-pay | Admitting: Family Medicine

## 2019-05-02 ENCOUNTER — Encounter: Payer: Self-pay | Admitting: Family Medicine

## 2019-05-16 ENCOUNTER — Telehealth: Payer: Self-pay | Admitting: *Deleted

## 2019-05-16 ENCOUNTER — Other Ambulatory Visit: Payer: Self-pay | Admitting: Family Medicine

## 2019-05-16 DIAGNOSIS — I10 Essential (primary) hypertension: Secondary | ICD-10-CM

## 2019-05-16 NOTE — Telephone Encounter (Signed)
Requested medication (s) are due for refill today: yes  Requested medication (s) are on the active medication list: yes  Last refill:  04/17/2019  Future visit scheduled: no  Notes to clinic: review for refill Overdue for office visit    Requested Prescriptions  Pending Prescriptions Disp Refills   amLODipine (NORVASC) 10 MG tablet [Pharmacy Med Name: AMLODIPINE BESYLATE 10 MG TAB] 30 tablet 0    Sig: TAKE 1 TABLET BY MOUTH EVERY DAY     Cardiovascular:  Calcium Channel Blockers Failed - 05/16/2019  9:31 AM      Failed - Last BP in normal range    BP Readings from Last 1 Encounters:  03/31/19 (!) 175/74         Failed - Valid encounter within last 6 months    Recent Outpatient Visits          1 year ago Chronic pain of both knees   Primary Care at [facility], [place], [place]   1 year ago Chronic pain of both knees   Primary Care at [facility] [place], [place]   3 years ago Shingles   [facility] Care at [facility], Laurey Arrow, MD   3 years ago Nausea without vomiting   Primary Care at [facility], Benjaman Pott, PA-C   3 years ago Reactive airway disease, mild intermittent, uncomplicated   Primary Care at [facility], Linton Ham, MD              atenolol (TENORMIN) 50 MG tablet [Pharmacy Med Name: ATENOLOL 50 MG TABLET] 30 tablet 0    Sig: TAKE 1 TABLET BY MOUTH EVERY DAY     Cardiovascular:  Beta Blockers Failed - 05/16/2019  9:31 AM      Failed - Last BP in normal range    BP Readings from Last 1 Encounters:  03/31/19 (!) 175/74         Failed - Valid encounter within last 6 months    Recent Outpatient Visits          1 year ago Chronic pain of both knees   Primary Care at [facility], [place], [place]   1 year ago Chronic pain of both knees   Primary Care at [facility], [place], [place]   3 years ago Shingles   Primary Care at [facility], Laurey Arrow, MD   3 years ago Nausea without vomiting   Primary Care at [facility], Benjaman Pott, PA-C   3 years ago Reactive airway disease,  mild intermittent, uncomplicated   Primary Care at [facility], Linton Ham, MD             Passed - Last Heart Rate in normal range    Pulse Readings from Last 1 Encounters:  03/31/19 100

## 2019-05-17 NOTE — Telephone Encounter (Signed)
lvmtcb and reschedule toc/med refills appt

## 2019-05-29 ENCOUNTER — Encounter: Payer: Self-pay | Admitting: Adult Health Nurse Practitioner

## 2019-07-10 ENCOUNTER — Other Ambulatory Visit: Payer: Self-pay | Admitting: Family Medicine

## 2019-07-10 DIAGNOSIS — I10 Essential (primary) hypertension: Secondary | ICD-10-CM

## 2019-07-10 NOTE — Telephone Encounter (Signed)
Requested medication (s) are due for refill today: yes  Requested medication (s) are on the active medication list: yes  Last refill:  04/17/2019  Future visit scheduled: no  Notes to clinic: no valid encounter within last 6 months Review for refill   Requested Prescriptions  Pending Prescriptions Disp Refills   amLODipine (NORVASC) 10 MG tablet [Pharmacy Med Name: AMLODIPINE BESYLATE 10 MG TAB] 30 tablet 0    Sig: TAKE 1 TABLET BY MOUTH EVERY DAY      Cardiovascular:  Calcium Channel Blockers Failed - 07/10/2019 10:38 AM      Failed - Last BP in normal range    BP Readings from Last 1 Encounters:  03/31/19 (!) 175/74          Failed - Valid encounter within last 6 months    Recent Outpatient Visits           1 year ago Chronic pain of both knees   Primary Care at [facility], [place], [place]   1 year ago Chronic pain of both knees   Primary Care at [facility], [place], [place]   3 years ago Shingles   Primary Care at [facility], Laurey Arrow, MD   3 years ago Nausea without vomiting   Primary Care at [facility], Benjaman Pott, PA-C   3 years ago Reactive airway disease, mild intermittent, uncomplicated   Primary Care at [facility], Linton Ham, MD

## 2019-07-12 ENCOUNTER — Telehealth: Payer: Self-pay | Admitting: Emergency Medicine

## 2019-07-12 DIAGNOSIS — B349 Viral infection, unspecified: Secondary | ICD-10-CM

## 2019-07-12 MED ORDER — BENZONATATE 100 MG PO CAPS: 100.0000 mg | ORAL_CAPSULE | Freq: Two times a day (BID) | ORAL | 0 refills | Status: DC | PRN

## 2019-07-12 NOTE — Progress Notes (Signed)
E-Visit for Corona Virus Screening   Your current symptoms could be consistent with the coronavirus.  We haven't seen many case of influenza yet this year.  As such, I don't recommend flu treatment unless you have a confirmed positive flu test.  We have had a very significant increase in our coronavirus cases.  I recommend that you be tested for COVID if you have not been in the past week.  I recommend that you isolate yourself until this test results.  I will also call in some cough medicine for you.  You can take Tylenol or Motrin for the sore throat and discomfort.  See the information below.   Many health care providers can now test patients at their office but not all are.  [place] has multiple testing sites. For information on our [place] testing locations and hours go to [email or web address]  We are enrolling you in our [place] for [place] . Daily you will receive a questionnaire within the [place] website. Our COVID 19 response team will be monitoring your responses daily.  Testing Information: The COVID-19 Community Testing sites will begin testing BY APPOINTMENT ONLY.  You can schedule online at [email or web address]  If you do not have access to a smart phone or computer you may call [phone] for an appointment.  Testing Locations: Appointment schedule is 8 am to 3:30 pm at all sites  [facility] indoors at [address], [place] [address] [facility]  indoors at [place]. [address], [place], [place] [address] [place] indoors at [address] [place] [address]  Additional testing sites in the Community:  . For [facility] Testing sites in [facility]  FaceUpdate.uy  . For Pop-up testing sites in [place]  BowlDirectory.co.uk  . For Testing sites with regular hours https://onsms.org/Dibble/  . For [place] [email or web address]  . For [facility] [email or web address]  . For [facility] testing in [place] and [employer] [email or web address]  . For [facility] testing in [facility]   [email or web address].care/[email or web address]  For  more information about community testing call [phone]   We are enrolling you in our [place] for [place] . Daily you will receive a questionnaire within the [place] website. Our COVID 19 response team will be monitoring your responses daily.  Please quarantine yourself while awaiting your test results. If you develop fever/cough/breathlessness, please stay home for 10 days with improving symptoms and until you have had 24 hours of no fever (without taking a fever reducer).  You should wear a mask or cloth face covering over your nose and mouth if you must be around other people or animals, including pets (even at home). Try to stay at least 6 feet away from other people. This will protect the people around you.  Please continue good preventive care measures, including:  frequent hand-washing, avoid touching your face, cover coughs/sneezes, stay out of crowds and keep a 6 foot distance from others.  COVID-19 is a respiratory illness with symptoms that are similar to the flu. Symptoms are typically mild to moderate, but there have been cases of severe illness and death due to the virus.   The following symptoms may appear 2-14 days after exposure: . Fever . Cough . Shortness of breath or difficulty breathing . Chills . Repeated shaking with chills . Muscle pain . Headache . Sore throat . New loss of taste or smell . Fatigue . Congestion or runny nose . Nausea or vomiting .  Diarrhea  Go to  the nearest hospital ED for assessment if fever/cough/breathlessness are severe or illness seems like a threat to life.  It is vitally important that if you feel that you have an infection such as this virus or any other virus that you stay home and away from places where you may spread it to others.  You should avoid contact with people age 49 and older.   You can use medication such as A prescription cough medication called Tessalon Perles 100 mg. You may take 1-2 capsules every 8 hours as needed for cough  You may also take acetaminophen (Tylenol) as needed for fever.  Reduce your risk of any infection by using the same precautions used for avoiding the common cold or flu:  Marland Kitchen Wash your hands often with soap and warm water for at least 20 seconds.  If soap and water are not readily available, use an alcohol-based hand sanitizer with at least 60% alcohol.  . If coughing or sneezing, cover your mouth and nose by coughing or sneezing into the elbow areas of your shirt or coat, into a tissue or into your sleeve (not your hands). . Avoid shaking hands with others and consider head nods or verbal greetings only. . Avoid touching your eyes, nose, or mouth with unwashed hands.  . Avoid close contact with people who are sick. . Avoid places or events with large numbers of people in one location, like concerts or sporting events. . Carefully consider travel plans you have or are making. . If you are planning any travel outside or inside the [place], visit the CDC's Travelers' Health webpage for the latest health notices. . If you have some symptoms but not all symptoms, continue to monitor at home and seek medical attention if your symptoms worsen. . If you are having a medical emergency, call 911.  HOME CARE . Only take medications as instructed by your medical team. . Drink plenty of fluids and get plenty of rest. . A steam or ultrasonic humidifier can help if you have congestion.   GET HELP RIGHT AWAY  IF YOU HAVE EMERGENCY WARNING SIGNS** FOR COVID-19. If you or someone is showing any of these signs seek emergency medical care immediately. Call 911 or proceed to your closest emergency facility if: . You develop worsening high fever. . Trouble breathing . Bluish lips or face . Persistent pain or pressure in the chest . New confusion . Inability to wake or stay awake . You cough up blood. . Your symptoms become more severe  **This list is not all possible symptoms. Contact your medical provider for any symptoms that are sever or concerning to you.  MAKE SURE YOU   Understand these instructions.  Will watch your condition.  Will get help right away if you are not doing well or get worse.  Your e-visit answers were reviewed by a board certified advanced clinical practitioner to complete your personal care plan.  Depending on the condition, your plan could have included both over the counter or prescription medications.  If there is a problem please reply once you have received a response from your provider.  Your safety is important to [place].  If you have drug allergies check your prescription carefully.    You can use MyChart to ask questions about today's visit, request a non-urgent call back, or ask for a work or school excuse for 24 hours related to this e-Visit. If it has been greater than 24 hours you  will need to follow up with your provider, or enter a new e-Visit to address those concerns. You will get an e-mail in the next two days asking about your experience.  I hope that your e-visit has been valuable and will speed your recovery. Thank you for using e-visits.   Approximately 5 minutes was used in reviewing the patient's chart, questionnaire, prescribing medications, and documentation.

## 2019-08-01 ENCOUNTER — Other Ambulatory Visit: Payer: Self-pay | Admitting: Family Medicine

## 2019-08-01 DIAGNOSIS — I10 Essential (primary) hypertension: Secondary | ICD-10-CM

## 2019-08-14 ENCOUNTER — Encounter: Payer: Self-pay | Admitting: Adult Health Nurse Practitioner

## 2019-09-09 IMAGING — CR DG CHEST 2V
2 series · 2 of 2 positions shown · non-contrast
Comparison: 10/11/2016

CLINICAL DATA: Productive cough and left-sided chest pain for 3
days.

EXAM:
CHEST  2 VIEW

[chest pa]
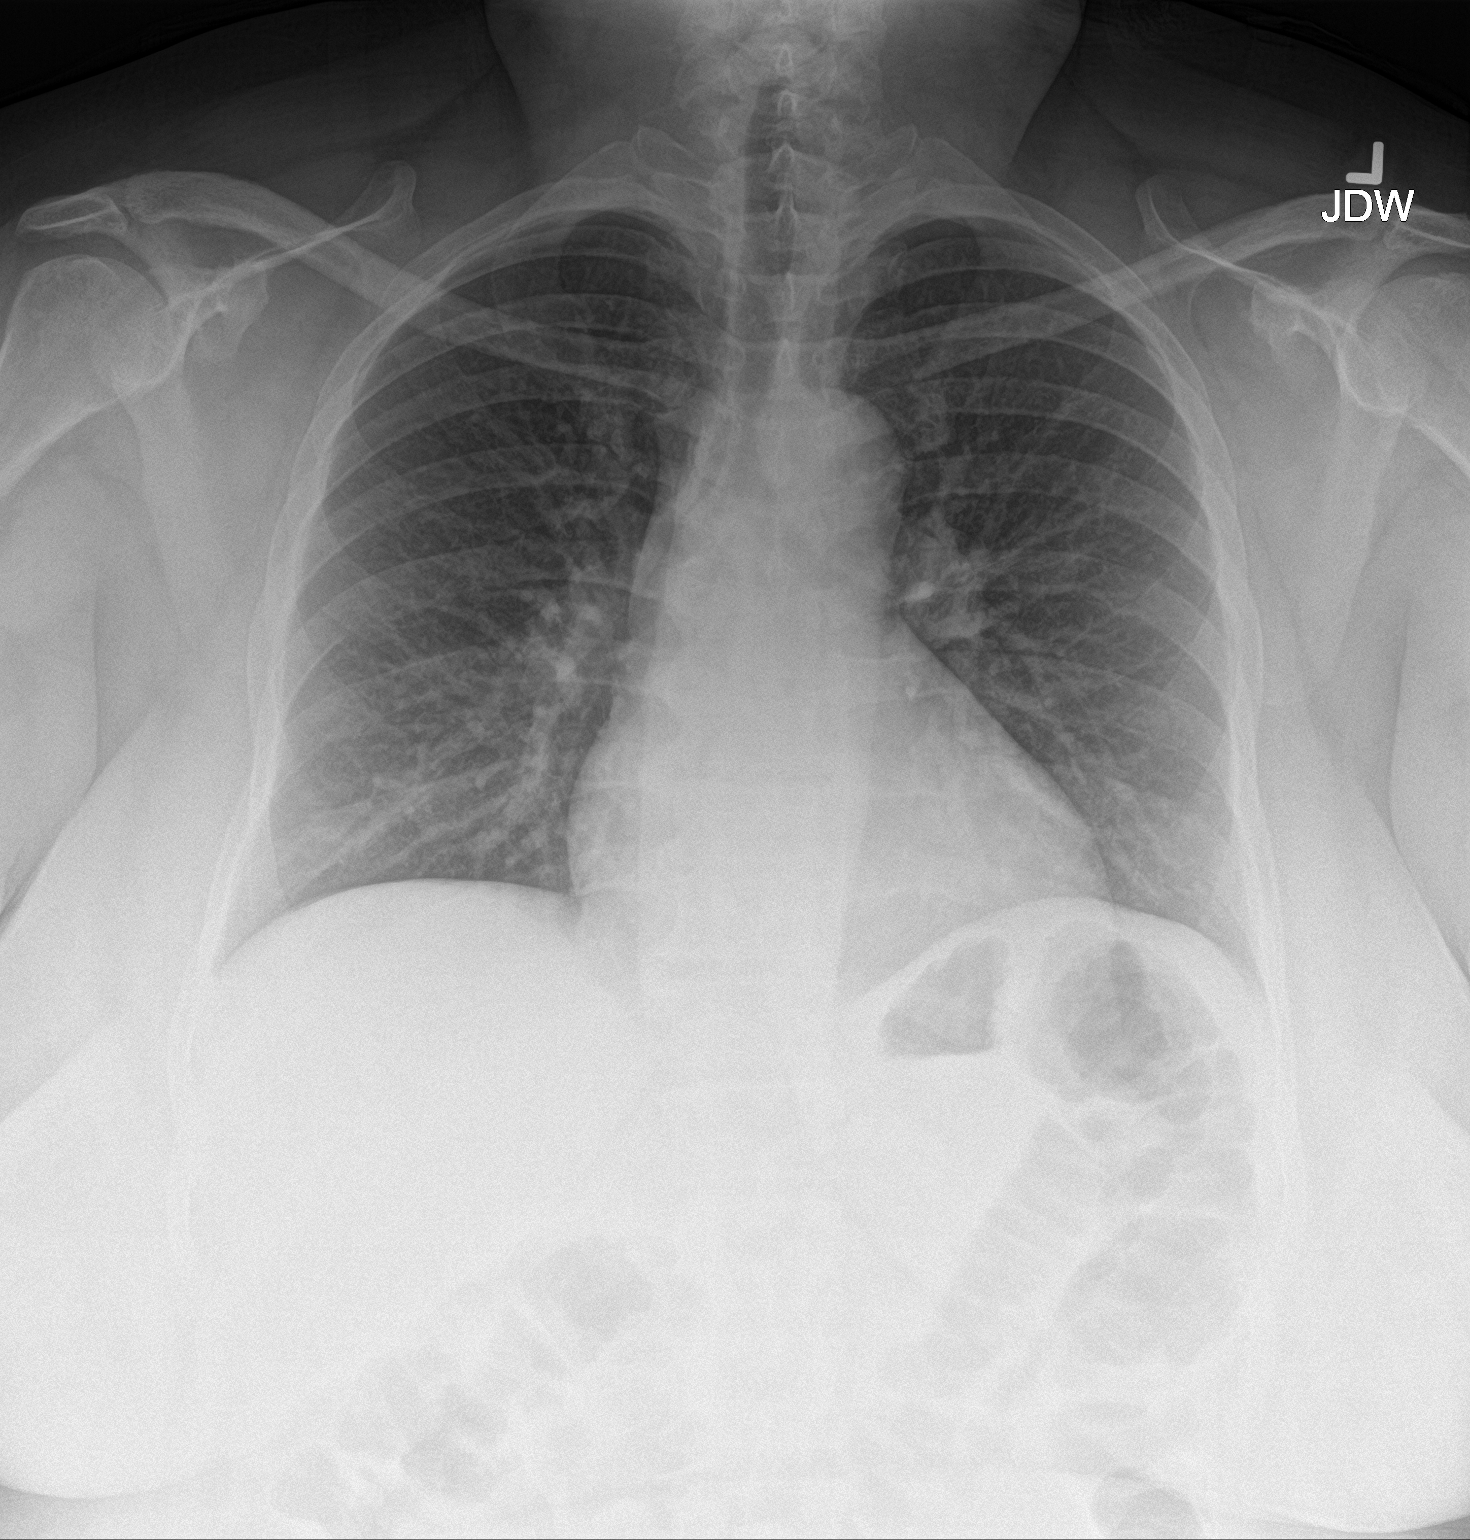

[chest lat]
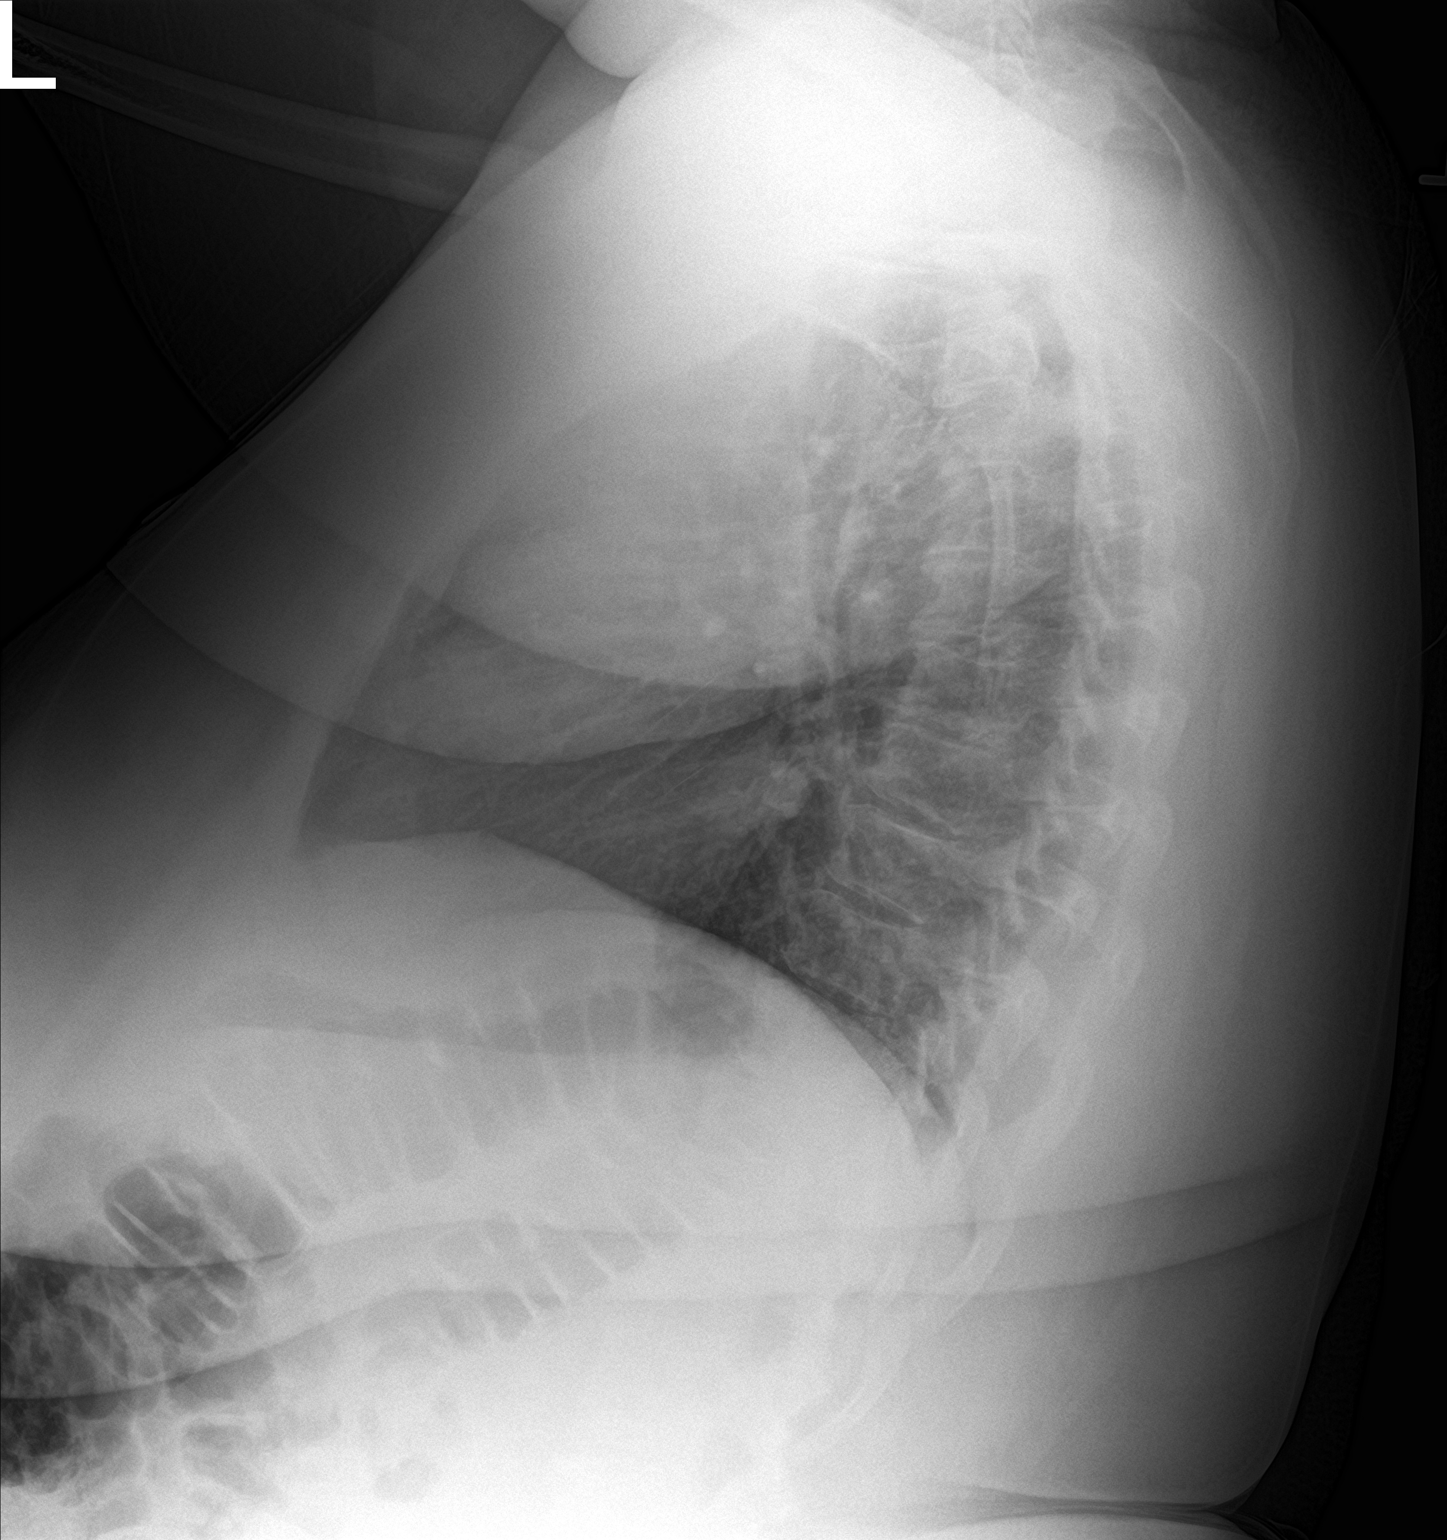

[2 of 2 positions shown; findings below may reference images not displayed]

FINDINGS: The heart size and mediastinal contours are within normal limits.
Both lungs are clear. The visualized skeletal structures are
unremarkable.
IMPRESSION: No active cardiopulmonary disease.

## 2019-09-20 ENCOUNTER — Other Ambulatory Visit: Payer: Self-pay | Admitting: Family Medicine

## 2019-09-20 DIAGNOSIS — I10 Essential (primary) hypertension: Secondary | ICD-10-CM

## 2019-10-01 ENCOUNTER — Encounter: Payer: Self-pay | Admitting: Certified Nurse Midwife

## 2019-10-17 ENCOUNTER — Other Ambulatory Visit: Payer: Self-pay

## 2019-10-17 ENCOUNTER — Telehealth: Payer: Self-pay | Admitting: Adult Health Nurse Practitioner

## 2019-10-17 MED ORDER — ATENOLOL 50 MG PO TABS: 50.0000 mg | ORAL_TABLET | Freq: Every day | ORAL | 3 refills | Status: DC

## 2019-10-17 MED ORDER — ALBUTEROL SULFATE HFA 108 (90 BASE) MCG/ACT IN AERS: 2.0000 | INHALATION_SPRAY | Freq: Four times a day (QID) | RESPIRATORY_TRACT | 2 refills | Status: DC | PRN

## 2019-10-17 MED ORDER — AMLODIPINE BESYLATE 10 MG PO TABS: 10.0000 mg | ORAL_TABLET | Freq: Every day | ORAL | 3 refills | Status: DC

## 2019-10-17 NOTE — Patient Instructions (Signed)
° ° ° °  If you have lab work done today you will be contacted with your lab results within the next 2 weeks.  If you have not heard from us then please contact us. The fastest way to get your results is to register for My Chart. ° ° °IF you received an x-ray today, you will receive an invoice from [facility]. Please contact  Radiology at [phone] with questions or concerns regarding your invoice.  ° °IF you received labwork today, you will receive an invoice from [facility]. Please contact [facility] at [phone] with questions or concerns regarding your invoice.  ° °Our billing staff will not be able to assist you with questions regarding bills from these companies. ° °You will be contacted with the lab results as soon as they are available. The fastest way to get your results is to activate your My Chart account. Instructions are located on the last page of this paperwork. If you have not heard from us regarding the results in 2 weeks, please contact this office. °  ° ° ° °

## 2019-11-16 ENCOUNTER — Ambulatory Visit: Payer: Self-pay | Admitting: Family Medicine

## 2019-11-16 ENCOUNTER — Ambulatory Visit: Payer: Self-pay | Admitting: Adult Health Nurse Practitioner

## 2019-11-19 ENCOUNTER — Encounter: Payer: Self-pay | Admitting: Family Medicine

## 2019-12-19 ENCOUNTER — Encounter: Payer: 59 | Admitting: Registered Nurse

## 2020-01-07 ENCOUNTER — Encounter (HOSPITAL_COMMUNITY): Payer: Self-pay

## 2020-01-07 ENCOUNTER — Ambulatory Visit (HOSPITAL_COMMUNITY)
Admission: EM | Admit: 2020-01-07 | Discharge: 2020-01-07 | Disposition: A | Discharge status: Home / Self Care | Payer: 59 | Attending: Family Medicine | Admitting: Family Medicine

## 2020-01-07 DIAGNOSIS — J029 Acute pharyngitis, unspecified: Secondary | ICD-10-CM | POA: Diagnosis not present

## 2020-01-07 DIAGNOSIS — J039 Acute tonsillitis, unspecified: Secondary | ICD-10-CM

## 2020-01-07 LAB — POCT RAPID STREP A: Streptococcus, Group A Screen (Direct): NEGATIVE

## 2020-01-07 MED ORDER — AMOXICILLIN 875 MG PO TABS: 875.0000 mg | ORAL_TABLET | Freq: Two times a day (BID) | ORAL | 0 refills | Status: AC

## 2020-01-07 NOTE — Discharge Instructions (Signed)
Strep test is negative Throat culture is pending It will be available in 2 to 3 days on MyChart.  Check my chart for your test results.  If your throat culture is negative, you may stop the antibiotics as soon as you feel better If your strep test is positive you need to take 10 full days of antibiotics You may take Tylenol or ibuprofen for pain. You may use sore throat lozenges or spray, salt water gargles for pain

## 2020-01-07 NOTE — ED Triage Notes (Signed)
Pt presents with sore throat x 2 days. Denies fever. Ibuprofen relieve the pain.

## 2020-01-07 NOTE — ED Provider Notes (Signed)
[facility]    CSN: [id number] Arrival date & time: 01/07/20  1423      History   Chief Complaint Chief Complaint  Patient presents with  . Sore Throat    HPI Christine Oneal is a 49 y.o. female.   HPI   Patient states she has a sore throat for 2-day She has no fever, no abdominal pain, no nausea or vomiting She has no headaches, body aches, or shortness of breath She has not yet had the coronavirus vaccines She was exposed to her daughter who had strep throat She states she is compliant with her blood pressure medication, but her blood pressure is elevated today because of her pain Past Medical History:  Diagnosis Date  . Abnormal Pap smear of cervix   . Allergy   . Anemia   . Fibroid   . Hypertension   . Leukocytosis, unspecified 10/18/2013  . Obesity   . Plantar fasciitis     Patient Active Problem List   Diagnosis Date Noted  . Anemia 09/04/2017  . BMI 50.0-59.9, adult ([place]) 09/03/2017  . Reactive airway disease 09/03/2017  . Vitamin D deficiency 09/03/2017  . Leukocytosis 10/18/2013  . Essential hypertension 10/24/2007  . EXTERNAL HEMORRHOIDS 10/24/2007  . ANAL FISSURE 10/24/2007    Past Surgical History:  Procedure Laterality Date  . COLPOSCOPY    . TUBAL LIGATION  2001    OB History    Gravida  4   Para  4   Term  4   Preterm      AB      Living  4     SAB      TAB      Ectopic      Multiple      Live Births               Home Medications    Prior to Admission medications   Medication Sig Start Date End Date Taking? Authorizing Provider  albuterol (VENTOLIN HFA) 108 (90 Base) MCG/ACT inhaler Inhale 2 puffs into the lungs every 6 (six) hours as needed for wheezing or shortness of breath. 10/17/19   Wendall Mola, NP  amLODipine (NORVASC) 10 MG tablet Take 1 tablet (10 mg total) by mouth daily. 10/17/19   Wendall Mola, NP  amoxicillin (AMOXIL) 875 MG tablet Take 1 tablet (875 mg total) by mouth 2  (two) times daily for 10 days. 01/07/20 01/17/20  Raylene Everts, MD  atenolol (TENORMIN) 50 MG tablet TAKE 1 TABLET BY MOUTH EVERY DAY 09/20/19   Forrest Moron, MD  diphenhydrAMINE (BENADRYL) 25 MG tablet Take 1 tablet (25 mg total) by mouth every 6 (six) hours. 03/31/19 01/07/20  Montine Circle, PA-C  famotidine (PEPCID) 20 MG tablet Take 1 tablet (20 mg total) by mouth 2 (two) times daily. Patient not taking: Reported on 10/17/2019 03/31/19 01/07/20  Montine Circle, PA-C    Family History Family History  Problem Relation Age of Onset  . Hypertension Mother   . Cervical cancer Mother   . Hypertension Father   . Stroke Father   . Uterine cancer Maternal Grandmother   . Diabetes Sister   . Hypertension Brother   . Hypertension Brother     Social History Social History   Tobacco Use  . Smoking status: Never Smoker  . Smokeless tobacco: Never Used  Vaping Use  . Vaping Use: Never used  Substance Use Topics  . Alcohol use: No  .  Drug use: No     Allergies   Lisinopril-hydrochlorothiazide and Lisinopril   Review of Systems Review of Systems  HENT: Positive for sore throat.      Physical Exam Triage Vital Signs ED Triage Vitals  Enc Vitals Group     BP 01/07/20 1542 (!) 156/97     Pulse Rate 01/07/20 1542 72     Resp 01/07/20 1542 20     Temp 01/07/20 1542 98 F (36.7 C)     Temp Source 01/07/20 1542 Oral     SpO2 01/07/20 1542 100 %     Weight --      Height --      Head Circumference --      Peak Flow --      Pain Score 01/07/20 1540 3     Pain Loc --      Pain Edu? --      Excl. in [place]? --    No data found.  Updated Vital Signs BP (!) 156/97 (BP Location: Left Wrist)   Pulse 72   Temp 98 F (36.7 C) (Oral)   Resp 20   LMP  (Within Weeks) Comment: 2weeks  SpO2 100%      Physical Exam Constitutional:      General: She is not in acute distress.    Appearance: She is well-developed. She is obese.  HENT:     Head: Normocephalic and atraumatic.      Right Ear: Tympanic membrane and ear canal normal.     Left Ear: Tympanic membrane normal.     Nose: No congestion.     Mouth/Throat:     Mouth: Mucous membranes are moist.     Pharynx: Posterior oropharyngeal erythema present.     Tonsils: No tonsillar exudate or tonsillar abscesses. 3+ on the right. 3+ on the left.  Eyes:     Conjunctiva/sclera: Conjunctivae normal.     Pupils: Pupils are equal, round, and reactive to light.  Cardiovascular:     Rate and Rhythm: Normal rate and regular rhythm.  Pulmonary:     Effort: Pulmonary effort is normal. No respiratory distress.     Breath sounds: Normal breath sounds.  Musculoskeletal:        General: Normal range of motion.     Cervical back: Normal range of motion.  Lymphadenopathy:     Cervical: Cervical adenopathy present.  Skin:    General: Skin is warm and dry.  Neurological:     Mental Status: She is alert.  Psychiatric:        Mood and Affect: Mood normal.        Behavior: Behavior normal.      UC Treatments / Results  Labs (all labs ordered are listed, but only abnormal results are displayed) Labs Reviewed  CULTURE, GROUP A STREP [facility])  POCT RAPID STREP A    EKG   Radiology No results found.  Procedures Procedures (including critical care time)  Medications Ordered in UC Medications - No data to display  Initial Impression / Assessment and Plan / UC Course  I have reviewed the triage vital signs and the nursing notes.  Pertinent labs & imaging results that were available during my care of the patient were reviewed by me and considered in my medical decision making (see chart for details).     I told patient that her rapid strep was negative, but she does have large swollen tonsils with exudate.  She was exposed to strep throat.  I feel it is prudent to cover her with an antibiotic pending throat culture.  I advised her to check my chart for her test results in 2 to 3 days.  She may stop the  antibiotics early if her throat culture is negative. Final Clinical Impressions(s) / UC Diagnoses   Final diagnoses:  Tonsillitis     Discharge Instructions     Strep test is negative Throat culture is pending It will be available in 2 to 3 days on MyChart.  Check my chart for your test results.  If your throat culture is negative, you may stop the antibiotics as soon as you feel better If your strep test is positive you need to take 10 full days of antibiotics You may take Tylenol or ibuprofen for pain. You may use sore throat lozenges or spray, salt water gargles for pain    ED Prescriptions    Medication Sig Dispense Auth. Provider   amoxicillin (AMOXIL) 875 MG tablet Take 1 tablet (875 mg total) by mouth 2 (two) times daily for 10 days. 20 tablet Raylene Everts, MD     PDMP not reviewed this encounter.   Raylene Everts, MD 01/07/20 [phone]

## 2020-01-10 LAB — CULTURE, GROUP A STREP (THRC)

## 2020-01-28 ENCOUNTER — Encounter: Payer: 59 | Admitting: Registered Nurse

## 2020-02-05 ENCOUNTER — Ambulatory Visit (INDEPENDENT_AMBULATORY_CARE_PROVIDER_SITE_OTHER): Payer: 59 | Admitting: Registered Nurse

## 2020-02-05 ENCOUNTER — Other Ambulatory Visit: Payer: Self-pay

## 2020-02-05 VITALS — BP 155/94 | HR 115 | Temp 98.0°F | Ht 63.0 in | Wt 312.4 lb

## 2020-02-05 DIAGNOSIS — I10 Essential (primary) hypertension: Secondary | ICD-10-CM

## 2020-02-05 DIAGNOSIS — Z130 Encounter for screening for diseases of the blood and blood-forming organs and certain disorders involving the immune mechanism: Secondary | ICD-10-CM

## 2020-02-05 DIAGNOSIS — Z13220 Encounter for screening for lipoid disorders: Secondary | ICD-10-CM

## 2020-02-05 DIAGNOSIS — Z6841 Body Mass Index (BMI) 40.0 and over, adult: Secondary | ICD-10-CM

## 2020-02-05 DIAGNOSIS — Z7689 Persons encountering health services in other specified circumstances: Secondary | ICD-10-CM

## 2020-02-05 DIAGNOSIS — Z1329 Encounter for screening for other suspected endocrine disorder: Secondary | ICD-10-CM

## 2020-02-05 DIAGNOSIS — I499 Cardiac arrhythmia, unspecified: Secondary | ICD-10-CM | POA: Diagnosis not present

## 2020-02-05 DIAGNOSIS — Z23 Encounter for immunization: Secondary | ICD-10-CM | POA: Diagnosis not present

## 2020-02-05 DIAGNOSIS — Z1159 Encounter for screening for other viral diseases: Secondary | ICD-10-CM

## 2020-02-05 DIAGNOSIS — Z13228 Encounter for screening for other metabolic disorders: Secondary | ICD-10-CM

## 2020-02-05 MED ORDER — METOPROLOL SUCCINATE ER 50 MG PO TB24: 50.0000 mg | ORAL_TABLET | Freq: Every day | ORAL | 3 refills | Status: DC

## 2020-02-05 NOTE — Patient Instructions (Signed)
° ° ° °  If you have lab work done today you will be contacted with your lab results within the next 2 weeks.  If you have not heard from us then please contact us. The fastest way to get your results is to register for My Chart. ° ° °IF you received an x-ray today, you will receive an invoice from [facility]. Please contact [facility] at [phone] with questions or concerns regarding your invoice.  ° °IF you received labwork today, you will receive an invoice from [facility]. Please contact [facility] at [phone] with questions or concerns regarding your invoice.  ° °Our billing staff will not be able to assist you with questions regarding bills from these companies. ° °You will be contacted with the lab results as soon as they are available. The fastest way to get your results is to activate your My Chart account. Instructions are located on the last page of this paperwork. If you have not heard from us regarding the results in 2 weeks, please contact this office. °  ° ° ° °

## 2020-02-06 LAB — CBC WITH DIFFERENTIAL
Basophils Absolute: 0.1 10*3/uL (ref 0.0–0.2)
Basos: 1 %
EOS (ABSOLUTE): 0.1 10*3/uL (ref 0.0–0.4)
Eos: 1 %
Hematocrit: 35.4 % (ref 34.0–46.6)
Hemoglobin: 11.3 g/dL (ref 11.1–15.9)
Immature Grans (Abs): 0 10*3/uL (ref 0.0–0.1)
Immature Granulocytes: 0 %
Lymphocytes Absolute: 2.4 10*3/uL (ref 0.7–3.1)
Lymphs: 27 %
MCH: 29.1 pg (ref 26.6–33.0)
MCHC: 31.9 g/dL (ref 31.5–35.7)
MCV: 91 fL (ref 79–97)
Monocytes Absolute: 0.8 10*3/uL (ref 0.1–0.9)
Monocytes: 9 %
Neutrophils Absolute: 5.5 10*3/uL (ref 1.4–7.0)
Neutrophils: 62 %
RBC: 3.88 x10E6/uL (ref 3.77–5.28)
RDW: 16.5 % — ABNORMAL HIGH (ref 11.7–15.4)
WBC: 8.8 10*3/uL (ref 3.4–10.8)

## 2020-02-06 LAB — COMPREHENSIVE METABOLIC PANEL
ALT: 15 IU/L (ref 0–32)
AST: 16 IU/L (ref 0–40)
Albumin/Globulin Ratio: 1.1 — ABNORMAL LOW (ref 1.2–2.2)
Albumin: 3.8 g/dL (ref 3.8–4.8)
Alkaline Phosphatase: 143 IU/L — ABNORMAL HIGH (ref 48–121)
BUN/Creatinine Ratio: 11 (ref 9–23)
BUN: 7 mg/dL (ref 6–24)
Bilirubin Total: 0.5 mg/dL (ref 0.0–1.2)
CO2: 21 mmol/L (ref 20–29)
Calcium: 11 mg/dL — ABNORMAL HIGH (ref 8.7–10.2)
Chloride: 105 mmol/L (ref 96–106)
Creatinine, Ser: 0.66 mg/dL (ref 0.57–1.00)
GFR calc Af Amer: 119 mL/min/{1.73_m2} (ref >59)
GFR calc non Af Amer: 103 mL/min/{1.73_m2} (ref >59)
Globulin, Total: 3.5 g/dL (ref 1.5–4.5)
Glucose: 141 mg/dL — ABNORMAL HIGH (ref 65–99)
Potassium: 3.7 mmol/L (ref 3.5–5.2)
Sodium: 140 mmol/L (ref 134–144)
Total Protein: 7.3 g/dL (ref 6.0–8.5)

## 2020-02-06 LAB — LIPID PANEL
Chol/HDL Ratio: 4.4 ratio (ref 0.0–4.4)
Cholesterol, Total: 202 mg/dL — ABNORMAL HIGH (ref 100–199)
HDL: 46 mg/dL (ref >39)
LDL Chol Calc (NIH): 139 mg/dL — ABNORMAL HIGH (ref 0–99)
Triglycerides: 93 mg/dL (ref 0–149)
VLDL Cholesterol Cal: 17 mg/dL (ref 5–40)

## 2020-02-06 LAB — HEMOGLOBIN A1C
Est. average glucose Bld gHb Est-mCnc: 117 mg/dL
Hgb A1c MFr Bld: 5.7 % — ABNORMAL HIGH (ref 4.8–5.6)

## 2020-02-06 LAB — HEPATITIS C ANTIBODY: Hep C Virus Ab: <0.1 s/co ratio (ref 0.0–0.9)

## 2020-02-06 LAB — TSH: TSH: 1.94 u[IU]/mL (ref 0.450–4.500)

## 2020-02-11 ENCOUNTER — Encounter: Payer: Self-pay | Admitting: Registered Nurse

## 2020-03-18 ENCOUNTER — Encounter: Payer: Self-pay | Admitting: Registered Nurse

## 2020-03-18 DIAGNOSIS — I499 Cardiac arrhythmia, unspecified: Secondary | ICD-10-CM | POA: Insufficient documentation

## 2020-03-18 NOTE — Progress Notes (Signed)
New Patient Office Visit  Subjective:  Patient ID: Christine Oneal, female    DOB: [birth year]  Age: 49 y.o. MRN: [id number]  CC:  Chief Complaint  Patient presents with  . New Patient (Initial Visit)    Pt would like to get better control over her wt to help bp    HPI Christine Oneal presents for visit to est care.  Hoping to discuss weight loss today. Has been trying to diet and exercise. Admits poor compliance and difficulty maintaining habits, progress has been slow Is able to discuss health risks of obesity.  Does note occ skipped beats. No chest pain, shob, doe, headaches, visual changes, claudication, or dependent edema. No other hx of CV symptoms or adverse cardiovascular events to her knowledge.  Otherwise no complaints, feeling well overall.   Past Medical History:  Diagnosis Date  . Abnormal Pap smear of cervix   . Allergy   . Anemia   . Fibroid   . Hypertension   . Leukocytosis, unspecified 10/18/2013  . Obesity   . Plantar fasciitis     Past Surgical History:  Procedure Laterality Date  . COLPOSCOPY    . TUBAL LIGATION  2001    Family History  Problem Relation Age of Onset  . Hypertension Mother   . Cervical cancer Mother   . Hypertension Father   . Stroke Father   . Uterine cancer Maternal Grandmother   . Diabetes Sister   . Hypertension Brother   . Hypertension Brother     Social History   Socioeconomic History  . Marital status: Divorced    Spouse name: Not on file  . Number of children: 4  . Years of education: Not on file  . Highest education level: Not on file  Occupational History  . 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 Smoking status: Never Smoker  . Smokeless tobacco: Never Used  Vaping Use  . Vaping Use: Never used  Substance and Sexual Activity  . Alcohol use: No  . Drug use: No  . Sexual activity: Yes    Partners: Male    Birth control/protection: Surgical    Comment: BTL  Other Topics Concern  .  Not on file  Social History Narrative   [name] has 4 children. Three live at home with her.   Social Determinants of Health   Financial Resource Strain:   . Difficulty of Paying Living Expenses: Not on file  Food Insecurity:   . Worried About Charity fundraiser in the Last Year: Not on file  . Ran Out of Food in the Last Year: Not on file  Transportation Needs:   . Lack of Transportation (Medical): Not on file  . Lack of Transportation (Non-Medical): Not on file  Physical Activity:   . Days of Exercise per Week: Not on file  . Minutes of Exercise per Session: Not on file  Stress:   . Feeling of Stress : Not on file  Social Connections:   . Frequency of Communication with Friends and Family: Not on file  . Frequency of Social Gatherings with Friends and Family: Not on file  . Attends Religious Services: Not on file  . Active Member of Clubs or Organizations: Not on file  . Attends Archivist Meetings: Not on file  . Marital Status: Not on file  Intimate Partner Violence:   . Fear of Current or Ex-Partner: Not on file  . Emotionally Abused:  Not on file  . Physically Abused: Not on file  . Sexually Abused: Not on file    ROS Review of Systems  Constitutional: Negative.   HENT: Negative.   Eyes: Negative.   Respiratory: Negative.   Cardiovascular: Positive for palpitations. Negative for chest pain and leg swelling.  Gastrointestinal: Negative.   Endocrine: Negative.   Genitourinary: Negative.   Musculoskeletal: Negative.   Skin: Negative.   Allergic/Immunologic: Negative.   Neurological: Negative.   Hematological: Negative.   Psychiatric/Behavioral: Negative.   All other systems reviewed and are negative.   Objective:   Today's Vitals: BP (!) 155/94 (BP Location: Right Arm, Patient Position: Sitting, Cuff Size: Large)   Pulse (!) 115   Temp 98 F (36.7 C) (Temporal)   Ht 5\' 3"  (1.6 m)   Wt (!) 312 lb 6.4 oz (141.7 kg)   LMP 01/22/2020   SpO2 96%    BMI 55.34 kg/m   Physical Exam Vitals and nursing note reviewed.  Constitutional:      General: She is not in acute distress.    Appearance: Normal appearance. She is obese. She is not ill-appearing, toxic-appearing or diaphoretic.  Cardiovascular:     Rate and Rhythm: Normal rate and regular rhythm.     Pulses: Normal pulses.     Heart sounds: Normal heart sounds. No murmur heard.  No friction rub. No gallop.   Pulmonary:     Effort: Pulmonary effort is normal. No respiratory distress.     Breath sounds: Normal breath sounds. No stridor. No wheezing, rhonchi or rales.  Chest:     Chest wall: No tenderness.  Musculoskeletal:     Right lower leg: No edema.     Left lower leg: No edema.  Skin:    General: Skin is warm and dry.     Capillary Refill: Capillary refill takes less than 2 seconds.  Neurological:     General: No focal deficit present.     Mental Status: She is alert and oriented to person, place, and time. Mental status is at baseline.  Psychiatric:        Mood and Affect: Mood normal.        Behavior: Behavior normal.        Thought Content: Thought content normal.        Judgment: Judgment normal.     Assessment & Plan:   Problem List Items Addressed This Visit      Cardiovascular and Mediastinum   Essential hypertension   Relevant Medications   metoprolol succinate (TOPROL-XL) 50 MG 24 hr tablet     Other   BMI 50.0-59.9, adult ([facility])   Relevant Orders   Amb Ref to Medical Weight Management    Other Visit Diagnoses    Need for Tdap vaccination    -  Primary   Relevant Orders   Tdap vaccine greater than or equal to 7yo IM (Completed)   Need for hepatitis C screening test       Relevant Orders   Hepatitis C antibody (Completed)   Screening for endocrine, metabolic and immunity disorder       Relevant Orders   CBC With Differential (Completed)   Comprehensive metabolic panel (Completed)   TSH (Completed)   Hemoglobin A1c (Completed)   Lipid  screening       Relevant Orders   Lipid panel (Completed)   Irregular heart beats       Relevant Medications   metoprolol succinate (TOPROL-XL) 50 MG 24 hr  tablet   Other Relevant Orders   EKG 12-Lead (Completed)      Outpatient Encounter Medications as of 02/05/2020  Medication Sig  . albuterol (VENTOLIN HFA) 108 (90 Base) MCG/ACT inhaler Inhale 2 puffs into the lungs every 6 (six) hours as needed for wheezing or shortness of breath.  Marland Kitchen amLODipine (NORVASC) 10 MG tablet Take 1 tablet (10 mg total) by mouth daily.  Marland Kitchen atenolol (TENORMIN) 50 MG tablet TAKE 1 TABLET BY MOUTH EVERY DAY  . metoprolol succinate (TOPROL-XL) 50 MG 24 hr tablet Take 1 tablet (50 mg total) by mouth daily. Take with or immediately following a meal.  . [DISCONTINUED] diphenhydrAMINE (BENADRYL) 25 MG tablet Take 1 tablet (25 mg total) by mouth every 6 (six) hours.  . [DISCONTINUED] famotidine (PEPCID) 20 MG tablet Take 1 tablet (20 mg total) by mouth 2 (two) times daily. (Patient not taking: Reported on 10/17/2019)   No facility-administered encounter medications on file as of 02/05/2020.    Follow-up: No follow-ups on file.   PLAN  Refer to medical weight management  EKG shows few PACs, new since compared to 09/01/2017. Will start patient on metoprolol and recheck at follow up. Any further cardiac symptoms warrant referral to cardiology  Labs collected, will follow up as warranted  tdap given  Patient encouraged to call clinic with any questions, comments, or concerns.  Maximiano Coss, NP

## 2020-04-10 ENCOUNTER — Ambulatory Visit (INDEPENDENT_AMBULATORY_CARE_PROVIDER_SITE_OTHER): Payer: 59 | Admitting: Family Medicine

## 2020-04-24 ENCOUNTER — Ambulatory Visit (INDEPENDENT_AMBULATORY_CARE_PROVIDER_SITE_OTHER): Payer: 59 | Admitting: Family Medicine

## 2020-05-13 ENCOUNTER — Ambulatory Visit: Payer: 59 | Admitting: Allergy & Immunology

## 2020-05-21 ENCOUNTER — Encounter: Payer: Self-pay | Admitting: *Deleted

## 2020-07-10 ENCOUNTER — Encounter (HOSPITAL_COMMUNITY): Payer: Self-pay | Admitting: Emergency Medicine

## 2020-07-10 ENCOUNTER — Other Ambulatory Visit: Payer: Self-pay

## 2020-07-10 ENCOUNTER — Ambulatory Visit (HOSPITAL_COMMUNITY)
Admission: EM | Admit: 2020-07-10 | Discharge: 2020-07-10 | Disposition: A | Discharge status: Home / Self Care | Payer: 59 | Attending: Family Medicine | Admitting: Family Medicine

## 2020-07-10 DIAGNOSIS — J040 Acute laryngitis: Secondary | ICD-10-CM | POA: Insufficient documentation

## 2020-07-10 DIAGNOSIS — Z79899 Other long term (current) drug therapy: Secondary | ICD-10-CM | POA: Diagnosis not present

## 2020-07-10 DIAGNOSIS — R059 Cough, unspecified: Secondary | ICD-10-CM | POA: Insufficient documentation

## 2020-07-10 DIAGNOSIS — J069 Acute upper respiratory infection, unspecified: Secondary | ICD-10-CM

## 2020-07-10 DIAGNOSIS — Z20822 Contact with and (suspected) exposure to covid-19: Secondary | ICD-10-CM | POA: Insufficient documentation

## 2020-07-10 DIAGNOSIS — Z888 Allergy status to other drugs, medicaments and biological substances status: Secondary | ICD-10-CM | POA: Diagnosis not present

## 2020-07-10 NOTE — ED Triage Notes (Signed)
Pt presents with productive cough, loss of voice, and chest congestion xs 2 days.

## 2020-07-10 NOTE — Discharge Instructions (Signed)
Go home to rest Drink plenty of fluids Take Tylenol for pain or fever You may take over-the-counter cough and cold medicines as needed You must quarantine at home until your test result is available You can check for your test result in MyChart  

## 2020-07-11 LAB — SARS CORONAVIRUS 2 (TAT 6-24 HRS): SARS Coronavirus 2: NEGATIVE

## 2020-07-12 NOTE — ED Provider Notes (Signed)
[facility]    CSN: [id number] Arrival date & time: 07/10/20  1528      History   Chief Complaint Chief Complaint  Patient presents with  . Cough  . Laryngitis    HPI [name] is a 50 y.o. female.   HPI  Cough and congestion in chest for 2 days No fever or chills or SOB No headache or body aches No known exposure to COVID Has laryngitis Is not vaccinated  Past Medical History:  Diagnosis Date  . Abnormal Pap smear of cervix   . Allergy   . Anemia   . Fibroid   . Hypertension   . Leukocytosis, unspecified 10/18/2013  . Obesity   . Plantar fasciitis     Patient Active Problem List   Diagnosis Date Noted  . Irregular heart beats 03/18/2020  . Anemia 09/04/2017  . BMI 50.0-59.9, adult ([facility]) 09/03/2017  . Reactive airway disease 09/03/2017  . Vitamin D deficiency 09/03/2017  . Leukocytosis 10/18/2013  . Essential hypertension 10/24/2007  . EXTERNAL HEMORRHOIDS 10/24/2007  . ANAL FISSURE 10/24/2007    Past Surgical History:  Procedure Laterality Date  . COLPOSCOPY    . TUBAL LIGATION  2001    OB History    Gravida  4   Para  4   Term  4   Preterm      AB      Living  4     SAB      IAB      Ectopic      Multiple      Live Births               Home Medications    Prior to Admission medications   Medication Sig Start Date End Date Taking? Authorizing Provider  albuterol (VENTOLIN HFA) 108 (90 Base) MCG/ACT inhaler Inhale 2 puffs into the lungs every 6 (six) hours as needed for wheezing or shortness of breath. 10/17/19   Royal Hawthorn, NP  amLODipine (NORVASC) 10 MG tablet Take 1 tablet (10 mg total) by mouth daily. 10/17/19   Royal Hawthorn, NP  atenolol (TENORMIN) 50 MG tablet TAKE 1 TABLET BY MOUTH EVERY DAY 09/20/19   Collie Siad A, MD  metoprolol succinate (TOPROL-XL) 50 MG 24 hr tablet Take 1 tablet (50 mg total) by mouth daily. Take with or immediately following a meal. 02/05/20   Janeece Agee,  NP    Family History Family History  Problem Relation Age of Onset  . Hypertension Mother   . Cervical cancer Mother   . Hypertension Father   . Stroke Father   . Uterine cancer Maternal Grandmother   . Diabetes Sister   . Hypertension Brother   . Hypertension Brother     Social History Social History   Tobacco Use  . Smoking status: Never Smoker  . Smokeless tobacco: Never Used  Vaping Use  . Vaping Use: Never used  Substance Use Topics  . Alcohol use: No  . Drug use: No     Allergies   Lisinopril-hydrochlorothiazide, Other, and Lisinopril   Review of Systems Review of Systems See HPI   Physical Exam Triage Vital Signs ED Triage Vitals  Enc Vitals Group     BP 07/10/20 1806 (!) 133/99     Pulse Rate 07/10/20 1806 75     Resp --      Temp 07/10/20 1806 98.1 F (36.7 C)     Temp Source 07/10/20 1806 Oral  SpO2 07/10/20 1806 99 %     Weight --      Height --      Head Circumference --      Peak Flow --      Pain Score 07/10/20 1804 0     Pain Loc --      Pain Edu? --      Excl. in [place]? --    No data found.  Updated Vital Signs BP (!) 133/99 (BP Location: Right Wrist)   Pulse 75   Temp 98.1 F (36.7 C) (Oral)   LMP 06/05/2020   SpO2 99%      Physical Exam Constitutional:      General: She is not in acute distress.    Appearance: She is well-developed and well-nourished. She is not ill-appearing.  HENT:     Head: Normocephalic and atraumatic.     Nose: Nose normal.     Mouth/Throat:     Mouth: Oropharynx is clear and moist.     Pharynx: No posterior oropharyngeal erythema.  Eyes:     Conjunctiva/sclera: Conjunctivae normal.     Pupils: Pupils are equal, round, and reactive to light.  Cardiovascular:     Rate and Rhythm: Normal rate.  Pulmonary:     Effort: Pulmonary effort is normal. No respiratory distress.     Comments: Lungs are clear Abdominal:     General: There is no distension.     Palpations: Abdomen is soft.   Musculoskeletal:        General: No edema. Normal range of motion.     Cervical back: Normal range of motion.  Skin:    General: Skin is warm and dry.  Neurological:     Mental Status: She is alert.  Psychiatric:        Behavior: Behavior normal.      UC Treatments / Results  Labs (all labs ordered are listed, but only abnormal results are displayed) Labs Reviewed  SARS CORONAVIRUS 2 (TAT 6-24 HRS)    EKG   Radiology No results found.  Procedures Procedures (including critical care time)  Medications Ordered in UC Medications - No data to display  Initial Impression / Assessment and Plan / UC Course  I have reviewed the triage vital signs and the nursing notes.  Pertinent labs & imaging results that were available during my care of the patient were reviewed by me and considered in my medical decision making (see chart for details).     Final Clinical Impressions(s) / UC Diagnoses   Final diagnoses:  Viral URI with cough     Discharge Instructions     Go home to rest Drink plenty of fluids Take Tylenol for pain or fever You may take over-the-counter cough and cold medicines as needed You must quarantine at home until your test result is available You can check for your test result in MyChart    ED Prescriptions    None     PDMP not reviewed this encounter.   Raylene Everts, MD 07/12/20 [place]

## 2020-07-13 ENCOUNTER — Telehealth: Payer: 59 | Admitting: Family

## 2020-07-13 DIAGNOSIS — J029 Acute pharyngitis, unspecified: Secondary | ICD-10-CM | POA: Diagnosis not present

## 2020-07-13 MED ORDER — PREDNISONE 10 MG (21) PO TBPK: ORAL_TABLET | ORAL | 0 refills | Status: DC

## 2020-07-13 NOTE — Progress Notes (Signed)
We are sorry that you are not feeling well.  Here is how we plan to help!  Your symptoms indicate a likely viral infection (Pharyngitis).   Pharyngitis is inflammation in the back of the throat which can cause a sore throat, scratchiness and sometimes difficulty swallowing.   Pharyngitis is typically caused by a respiratory virus and will just run its course.  Please keep in mind that your symptoms could last up to 10 days.  For throat pain, we recommend over the counter oral pain relief medications such as acetaminophen or aspirin, or anti-inflammatory medications such as ibuprofen or naproxen sodium.  Topical treatments such as oral throat lozenges or sprays may be used as needed.  Avoid close contact with loved ones, especially the very young and elderly.  Remember to wash your hands thoroughly throughout the day as this is the number one way to prevent the spread of infection and wipe down door knobs and counters with disinfectant.  I have sent a prednisone dose pack to your pharmacy.   After careful review of your answers, I would not recommend and antibiotic for your condition.  Antibiotics should not be used to treat conditions that we suspect are caused by viruses like the virus that causes the common cold or flu. However, some people can have Strep with atypical symptoms. You may need formal testing in clinic or office to confirm if your symptoms continue or worsen.  Providers prescribe antibiotics to treat infections caused by bacteria. Antibiotics are very powerful in treating bacterial infections when they are used properly.  To maintain their effectiveness, they should be used only when necessary.  Overuse of antibiotics has resulted in the development of super bugs that are resistant to treatment!    Home Care:  Only take medications as instructed by your medical team.  Do not drink alcohol while taking these medications.  A steam or ultrasonic humidifier can help congestion.  You can  place a towel over your head and breathe in the steam from hot water coming from a faucet.  Avoid close contacts especially the very young and the elderly.  Cover your mouth when you cough or sneeze.  Always remember to wash your hands.  Get Help Right Away If:  You develop worsening fever or throat pain.  You develop a severe head ache or visual changes.  Your symptoms persist after you have completed your treatment plan.  Make sure you  Understand these instructions.  Will watch your condition.  Will get help right away if you are not doing well or get worse.  Your e-visit answers were reviewed by a board certified advanced clinical practitioner to complete your personal care plan.  Depending on the condition, your plan could have included both over the counter or prescription medications.  If there is a problem please reply  once you have received a response from your provider.  Your safety is important to [place].  If you have drug allergies check your prescription carefully.    You can use MyChart to ask questions about todays visit, request a non-urgent call back, or ask for a work or school excuse for 24 hours related to this e-Visit. If it has been greater than 24 hours you will need to follow up with your provider, or enter a new e-Visit to address those concerns.  You will get an e-mail in the next two days asking about your experience.  I hope that your e-visit has been valuable and will speed your  recovery. Thank you for using e-visits.  Approximately 5 minutes was spent documenting and reviewing patient's chart.

## 2020-07-26 ENCOUNTER — Telehealth: Payer: 59 | Admitting: Nurse Practitioner

## 2020-07-26 DIAGNOSIS — R059 Cough, unspecified: Secondary | ICD-10-CM

## 2020-07-26 MED ORDER — BENZONATATE 100 MG PO CAPS: 100.0000 mg | ORAL_CAPSULE | Freq: Three times a day (TID) | ORAL | 0 refills | Status: DC | PRN

## 2020-07-26 NOTE — Progress Notes (Signed)
We are sorry that you are not feeling well.  Here is how we plan to help!  Based on your presentation I believe you most likely have A cough due to a virus.  This is called viral bronchitis and is best treated by rest, plenty of fluids and control of the cough.  You may use Ibuprofen or Tylenol as directed to help your symptoms.     In addition you may use A prescription cough medication called Tessalon Perles 100mg . You may take 1-2 capsules every 8 hours as needed for your cough.   You have just come off of steroids. Antibiotics do not work for this cough.   From your responses in the eVisit questionnaire you describe inflammation in the upper respiratory tract which is causing a significant cough.  This is commonly called Bronchitis and has four common causes:    Allergies  Viral Infections  Acid Reflux  Bacterial Infection Allergies, viruses and acid reflux are treated by controlling symptoms or eliminating the cause. An example might be a cough caused by taking certain blood pressure medications. You stop the cough by changing the medication. Another example might be a cough caused by acid reflux. Controlling the reflux helps control the cough.  USE OF BRONCHODILATOR ("RESCUE") INHALERS: There is a risk from using your bronchodilator too frequently.  The risk is that over-reliance on a medication which only relaxes the muscles surrounding the breathing tubes can reduce the effectiveness of medications prescribed to reduce swelling and congestion of the tubes themselves.  Although you feel brief relief from the bronchodilator inhaler, your asthma may actually be worsening with the tubes becoming more swollen and filled with mucus.  This can delay other crucial treatments, such as oral steroid medications. If you need to use a bronchodilator inhaler daily, several times per day, you should discuss this with your provider.  There are probably better treatments that could be used to keep your  asthma under control.     HOME CARE . Only take medications as instructed by your medical team. . Complete the entire course of an antibiotic. . Drink plenty of fluids and get plenty of rest. . Avoid close contacts especially the very young and the elderly . Cover your mouth if you cough or cough into your sleeve. . Always remember to wash your hands . A steam or ultrasonic humidifier can help congestion.   GET HELP RIGHT AWAY IF: . You develop worsening fever. . You become short of breath . You cough up blood. . Your symptoms persist after you have completed your treatment plan MAKE SURE YOU   Understand these instructions.  Will watch your condition.  Will get help right away if you are not doing well or get worse.  Your e-visit answers were reviewed by a board certified advanced clinical practitioner to complete your personal care plan.  Depending on the condition, your plan could have included both over the counter or prescription medications. If there is a problem please reply  once you have received a response from your provider. Your safety is important to [place].  If you have drug allergies check your prescription carefully.    You can use MyChart to ask questions about today's visit, request a non-urgent call back, or ask for a work or school excuse for 24 hours related to this e-Visit. If it has been greater than 24 hours you will need to follow up with your provider, or enter a new e-Visit to address those concerns.  You will get an e-mail in the next two days asking about your experience.  I hope that your e-visit has been valuable and will speed your recovery. Thank you for using e-visits.  5-10 minutes spent reviewing and documenting in chart.

## 2020-09-03 ENCOUNTER — Other Ambulatory Visit: Payer: Self-pay | Admitting: Registered Nurse

## 2020-09-03 MED ORDER — ALBUTEROL SULFATE HFA 108 (90 BASE) MCG/ACT IN AERS: 2.0000 | INHALATION_SPRAY | Freq: Four times a day (QID) | RESPIRATORY_TRACT | 2 refills | Status: DC | PRN

## 2020-09-03 NOTE — Telephone Encounter (Signed)
Copied from [place] [id number]. Topic: Quick Communication - Rx Refill/Question >> Sep 03, 2020  8:27 AM Leward Quan A wrote: Medication: albuterol (VENTOLIN HFA) 108 (90 Base) MCG/ACT inhaler   Has the patient contacted their pharmacy? Yes.   (Agent: If no, request that the patient contact the pharmacy for the refill.) (Agent: If yes, when and what did the pharmacy advise?)  Preferred Pharmacy (with phone number or street name): [facility] [address], [place]  Phone:  [phone] Fax:  [phone]     Agent: Please be advised that RX refills may take up to 3 business days. We ask that you follow-up with your pharmacy.

## 2021-04-28 ENCOUNTER — Telehealth: Payer: Self-pay

## 2021-04-28 NOTE — Telephone Encounter (Signed)
Error

## 2021-04-29 ENCOUNTER — Telehealth: Payer: Medicaid Other | Admitting: Nurse Practitioner

## 2021-04-29 DIAGNOSIS — J40 Bronchitis, not specified as acute or chronic: Secondary | ICD-10-CM

## 2021-04-29 MED ORDER — BENZONATATE 100 MG PO CAPS: 100.0000 mg | ORAL_CAPSULE | Freq: Three times a day (TID) | ORAL | 0 refills | Status: DC | PRN

## 2021-04-29 MED ORDER — AZITHROMYCIN 250 MG PO TABS: ORAL_TABLET | ORAL | 0 refills | Status: DC

## 2021-04-29 NOTE — Progress Notes (Signed)
We are sorry that you are not feeling well.  Here is how we plan to help! ? ?Based on your presentation I believe you most likely have A cough due to bacteria.  When patients have a fever and a productive cough with a change in color or increased sputum production, we are concerned about bacterial bronchitis.  If left untreated it can progress to pneumonia.  If your symptoms do not improve with your treatment plan it is important that you contact your provider.   I have prescribed Azithromyin 250 mg: two tablets now and then one tablet daily for 4 additonal days  ?  ?In addition you may use A prescription cough medication called Tessalon Perles 100mg. You may take 1-2 capsules every 8 hours as needed for your cough. ? ? ?From your responses in the eVisit questionnaire you describe inflammation in the upper respiratory tract which is causing a significant cough.  This is commonly called Bronchitis and has four common causes:   ?Allergies ?Viral Infections ?Acid Reflux ?Bacterial Infection ?Allergies, viruses and acid reflux are treated by controlling symptoms or eliminating the cause. An example might be a cough caused by taking certain blood pressure medications. You stop the cough by changing the medication. Another example might be a cough caused by acid reflux. Controlling the reflux helps control the cough. ? ?USE OF BRONCHODILATOR ("RESCUE") INHALERS: ?There is a risk from using your bronchodilator too frequently.  The risk is that over-reliance on a medication which only relaxes the muscles surrounding the breathing tubes can reduce the effectiveness of medications prescribed to reduce swelling and congestion of the tubes themselves.  Although you feel brief relief from the bronchodilator inhaler, your asthma may actually be worsening with the tubes becoming more swollen and filled with mucus.  This can delay other crucial treatments, such as oral steroid medications. If you need to use a bronchodilator  inhaler daily, several times per day, you should discuss this with your provider.  There are probably better treatments that could be used to keep your asthma under control.  ?   ?HOME CARE ?Only take medications as instructed by your medical team. ?Complete the entire course of an antibiotic. ?Drink plenty of fluids and get plenty of rest. ?Avoid close contacts especially the very young and the elderly ?Cover your mouth if you cough or cough into your sleeve. ?Always remember to wash your hands ?A steam or ultrasonic humidifier can help congestion.  ? ?GET HELP RIGHT AWAY IF: ?You develop worsening fever. ?You become short of breath ?You cough up blood. ?Your symptoms persist after you have completed your treatment plan ?MAKE SURE YOU  ?Understand these instructions. ?Will watch your condition. ?Will get help right away if you are not doing well or get worse. ?  ? ?Thank you for choosing an e-visit. ? ?Your e-visit answers were reviewed by a board certified advanced clinical practitioner to complete your personal care plan. Depending upon the condition, your plan could have included both over the counter or prescription medications. ? ?Please review your pharmacy choice. Make sure the pharmacy is open so you can pick up prescription now. If there is a problem, you may contact your provider through MyChart messaging and have the prescription routed to another pharmacy.  Your safety is important to us. If you have drug allergies check your prescription carefully.  ? ?For the next 24 hours you can use MyChart to ask questions about today's visit, request a non-urgent call back, or ask   for a work or school excuse. ?You will get an email in the next two days asking about your experience. I hope that your e-visit has been valuable and will speed your recovery. ? ?5-10 minutes spent reviewing and documenting in chart. ? ?

## 2021-05-01 ENCOUNTER — Ambulatory Visit: Payer: 59 | Admitting: Registered Nurse

## 2021-05-03 ENCOUNTER — Inpatient Hospital Stay (HOSPITAL_COMMUNITY)
Admission: EM | Admit: 2021-05-03 | Discharge: 2021-05-14 | DRG: 286 | Disposition: A | Discharge status: Home Health | Payer: 59 | Attending: Internal Medicine | Admitting: Internal Medicine

## 2021-05-03 ENCOUNTER — Emergency Department (HOSPITAL_COMMUNITY): Payer: 59

## 2021-05-03 ENCOUNTER — Other Ambulatory Visit: Payer: Self-pay

## 2021-05-03 DIAGNOSIS — Z20822 Contact with and (suspected) exposure to covid-19: Secondary | ICD-10-CM | POA: Diagnosis present

## 2021-05-03 DIAGNOSIS — I509 Heart failure, unspecified: Secondary | ICD-10-CM

## 2021-05-03 DIAGNOSIS — I5021 Acute systolic (congestive) heart failure: Secondary | ICD-10-CM | POA: Diagnosis not present

## 2021-05-03 DIAGNOSIS — I10 Essential (primary) hypertension: Secondary | ICD-10-CM | POA: Diagnosis present

## 2021-05-03 DIAGNOSIS — Z8249 Family history of ischemic heart disease and other diseases of the circulatory system: Secondary | ICD-10-CM

## 2021-05-03 DIAGNOSIS — I110 Hypertensive heart disease with heart failure: Principal | ICD-10-CM | POA: Diagnosis present

## 2021-05-03 DIAGNOSIS — E876 Hypokalemia: Secondary | ICD-10-CM | POA: Diagnosis present

## 2021-05-03 DIAGNOSIS — Z833 Family history of diabetes mellitus: Secondary | ICD-10-CM

## 2021-05-03 DIAGNOSIS — Z8049 Family history of malignant neoplasm of other genital organs: Secondary | ICD-10-CM

## 2021-05-03 DIAGNOSIS — Z6841 Body Mass Index (BMI) 40.0 and over, adult: Secondary | ICD-10-CM

## 2021-05-03 DIAGNOSIS — J45909 Unspecified asthma, uncomplicated: Secondary | ICD-10-CM | POA: Diagnosis present

## 2021-05-03 DIAGNOSIS — Z79899 Other long term (current) drug therapy: Secondary | ICD-10-CM

## 2021-05-03 DIAGNOSIS — Z95828 Presence of other vascular implants and grafts: Secondary | ICD-10-CM

## 2021-05-03 DIAGNOSIS — I428 Other cardiomyopathies: Secondary | ICD-10-CM | POA: Diagnosis present

## 2021-05-03 DIAGNOSIS — Z888 Allergy status to other drugs, medicaments and biological substances status: Secondary | ICD-10-CM

## 2021-05-03 DIAGNOSIS — I4891 Unspecified atrial fibrillation: Secondary | ICD-10-CM

## 2021-05-03 DIAGNOSIS — R059 Cough, unspecified: Secondary | ICD-10-CM | POA: Diagnosis present

## 2021-05-03 DIAGNOSIS — Z823 Family history of stroke: Secondary | ICD-10-CM

## 2021-05-03 DIAGNOSIS — Z23 Encounter for immunization: Secondary | ICD-10-CM

## 2021-05-03 DIAGNOSIS — I059 Rheumatic mitral valve disease, unspecified: Secondary | ICD-10-CM | POA: Diagnosis present

## 2021-05-03 DIAGNOSIS — Q2112 Patent foramen ovale: Secondary | ICD-10-CM

## 2021-05-03 DIAGNOSIS — I4819 Other persistent atrial fibrillation: Secondary | ICD-10-CM | POA: Diagnosis present

## 2021-05-03 DIAGNOSIS — R002 Palpitations: Secondary | ICD-10-CM | POA: Diagnosis present

## 2021-05-03 DIAGNOSIS — N179 Acute kidney failure, unspecified: Secondary | ICD-10-CM | POA: Diagnosis present

## 2021-05-03 MED ORDER — HEPARIN BOLUS VIA INFUSION
5000.0000 [IU] | Freq: Once | INTRAVENOUS | Status: AC
  Administered 2021-05-04: 5000 [IU] via INTRAVENOUS
  Filled 2021-05-03: qty 5000

## 2021-05-03 MED ORDER — HEPARIN (PORCINE) 25000 UT/250ML-% IV SOLN
1250.0000 [IU]/h | INTRAVENOUS | Status: DC
  Administered 2021-05-04: 1300 [IU]/h via INTRAVENOUS
  Administered 2021-05-04: 1350 [IU]/h via INTRAVENOUS
  Administered 2021-05-06: 1650 [IU]/h via INTRAVENOUS
  Administered 2021-05-06: 1350 [IU]/h via INTRAVENOUS
  Administered 2021-05-07: 1650 [IU]/h via INTRAVENOUS
  Administered 2021-05-08: 1600 [IU]/h via INTRAVENOUS
  Administered 2021-05-09 (×2): 1350 [IU]/h via INTRAVENOUS
  Administered 2021-05-10: 1250 [IU]/h via INTRAVENOUS
  Filled 2021-05-03 (×10): qty 250

## 2021-05-03 NOTE — ED Provider Notes (Signed)
[facility] EMERGENCY DEPARTMENT Provider Note   CSN: [id number] Arrival date & time: 05/03/21  2340     History Chief Complaint  Patient presents with   AFIB/RVR   Shortness of Breath    Christine Oneal is a 50 y.o. female.  Patient presents for evaluation of shortness of breath.  Patient reports that her breathing difficulties have been ongoing for a couple of weeks.  She has been using albuterol inhaler without much improvement.  She has had an associated cough.  Patient had a virtual visit with her doctor, was told she had an upper respiratory infection, was given Zithromax.  Breathing worsened tonight.  Patient called EMS to have identified the patient as being in atrial fibrillation with a rapid ventricular response.  Patient has no history of A. fib.  She does report that she has had a history of palpitations in the past that were not diagnosed as any specific arrhythmia.  She has noticed swelling of her legs.  She thinks that is because she has been short of breath and has not been moving around much.      Past Medical History:  Diagnosis Date   Abnormal Pap smear of cervix    Allergy    Anemia    Fibroid    Hypertension    Leukocytosis, unspecified 10/18/2013   Obesity    Plantar fasciitis     Patient Active Problem List   Diagnosis Date Noted   Irregular heart beats 03/18/2020   Anemia 09/04/2017   BMI 50.0-59.9, adult ([place]) 09/03/2017   Reactive airway disease 09/03/2017   Vitamin D deficiency 09/03/2017   Leukocytosis 10/18/2013   Essential hypertension 10/24/2007   EXTERNAL HEMORRHOIDS 10/24/2007   ANAL FISSURE 10/24/2007    Past Surgical History:  Procedure Laterality Date   COLPOSCOPY     TUBAL LIGATION  2001     OB History     Gravida  4   Para  4   Term  4   Preterm      AB      Living  4      SAB      IAB      Ectopic      Multiple      Live Births              Family History  Problem Relation Age  of Onset   Hypertension Mother    Cervical cancer Mother    Hypertension Father    Stroke Father    Uterine cancer Maternal Grandmother    Diabetes Sister    Hypertension Brother    Hypertension Brother     Social History   Tobacco Use   Smoking status: Never   Smokeless tobacco: Never  Vaping Use   Vaping Use: Never used  Substance Use Topics   Alcohol use: No   Drug use: No    Home Medications Prior to Admission medications   Medication Sig Start Date End Date Taking? Authorizing Provider  albuterol (VENTOLIN HFA) 108 (90 Base) MCG/ACT inhaler Inhale 2 puffs into the lungs every 6 (six) hours as needed for wheezing or shortness of breath. 09/03/20  Yes Maximiano Coss, NP  benzonatate (TESSALON PERLES) 100 MG capsule Take 1 capsule (100 mg total) by mouth 3 (three) times daily as needed for cough. 04/29/21  Yes Martin, Mary-Margaret, FNP  guaiFENesin (MUCINEX) 600 MG 12 hr tablet Take 600 mg by mouth 2 (two) times daily as needed  for cough.   Yes [provider]  Probiotic CHEW Chew 2 capsules by mouth daily.   Yes [provider]  amLODipine (NORVASC) 10 MG tablet Take 1 tablet (10 mg total) by mouth daily. 10/17/19   Wendall Mola, NP  atenolol (TENORMIN) 50 MG tablet TAKE 1 TABLET BY MOUTH EVERY DAY Patient not taking: No sig reported 09/20/19   Forrest Moron, MD  azithromycin (ZITHROMAX Z-PAK) 250 MG tablet As directed Patient not taking: No sig reported 04/29/21   Hassell Done, Mary-Margaret, FNP  metoprolol succinate (TOPROL-XL) 50 MG 24 hr tablet Take 1 tablet (50 mg total) by mouth daily. Take with or immediately following a meal. 02/05/20   Maximiano Coss, NP  predniSONE (STERAPRED UNI-PAK 21 TAB) 10 MG (21) TBPK tablet Use as directed Patient not taking: Reported on 05/04/2021 07/13/20   Sharion Balloon, FNP    Allergies    Lisinopril-hydrochlorothiazide, Other, and Lisinopril  Review of Systems   Review of Systems  Respiratory:  Positive for  cough, shortness of breath and wheezing.   Cardiovascular:  Positive for palpitations and leg swelling.  All other systems reviewed and are negative.  Physical Exam Updated Vital Signs BP (!) 156/130   Pulse (!) 128   Temp 98.1 F (36.7 C) (Oral)   Resp (!) 42   Ht 5\' 3"  (1.6 m)   Wt (!) 141.1 kg   LMP 05/01/2021 (Exact Date)   SpO2 100%   BMI 55.09 kg/m   Physical Exam Vitals and nursing note reviewed.  Constitutional:      General: She is not in acute distress.    Appearance: Normal appearance. She is well-developed. She is obese.  HENT:     Head: Normocephalic and atraumatic.     Right Ear: Hearing normal.     Left Ear: Hearing normal.     Nose: Nose normal.  Eyes:     Conjunctiva/sclera: Conjunctivae normal.     Pupils: Pupils are equal, round, and reactive to light.  Cardiovascular:     Rate and Rhythm: Tachycardia present. Rhythm regularly irregular.     Heart sounds: S1 normal and S2 normal. No murmur heard.   No friction rub. No gallop.  Pulmonary:     Effort: Pulmonary effort is normal. Tachypnea present. No respiratory distress.     Breath sounds: Normal breath sounds.  Chest:     Chest wall: No tenderness.  Abdominal:     General: Bowel sounds are normal.     Palpations: Abdomen is soft.     Tenderness: There is no abdominal tenderness. There is no guarding or rebound. Negative signs include Murphy's sign and McBurney's sign.     Hernia: No hernia is present.  Musculoskeletal:        General: Normal range of motion.     Cervical back: Normal range of motion and neck supple.     Right lower leg: 2+ Pitting Edema present.     Left lower leg: 2+ Pitting Edema present.  Skin:    General: Skin is warm and dry.     Findings: No rash.  Neurological:     Mental Status: She is alert and oriented to person, place, and time.     GCS: GCS eye subscore is 4. GCS verbal subscore is 5. GCS motor subscore is 6.     Cranial Nerves: No cranial nerve deficit.      Sensory: No sensory deficit.     Coordination: Coordination normal.  Psychiatric:  Speech: Speech normal.        Behavior: Behavior normal.        Thought Content: Thought content normal.    ED Results / Procedures / Treatments   Labs (all labs ordered are listed, but only abnormal results are displayed) Labs Reviewed  CBC WITH DIFFERENTIAL/PLATELET - Abnormal; Notable for the following components:      Result Value   RBC 3.69 (*)    Hemoglobin 11.2 (*)    HCT 35.5 (*)    All other components within normal limits  BASIC METABOLIC PANEL - Abnormal; Notable for the following components:   Sodium 134 (*)    CO2 20 (*)    Glucose, Bld 173 (*)    All other components within normal limits  BRAIN NATRIURETIC PEPTIDE - Abnormal; Notable for the following components:   B Natriuretic Peptide 670.0 (*)    All other components within normal limits  TROPONIN I (HIGH SENSITIVITY) - Abnormal; Notable for the following components:   Troponin I (High Sensitivity) 48 (*)    All other components within normal limits  RESP PANEL BY RT-PCR (FLU A&B, COVID) ARPGX2  HEPARIN LEVEL (UNFRACTIONATED)  TROPONIN I (HIGH SENSITIVITY)    EKG EKG Interpretation  Date/Time:  Sunday May 03 2021 23:40:59 EDT Ventricular Rate:  152 PR Interval:    QRS Duration: 73 QT Interval:  279 QTC Calculation: 444 R Axis:   54 Text Interpretation: Atrial fibrillation Ventricular premature complex Borderline repolarization abnormality Confirmed by Orpah Greek [phone]) on 05/03/2021 11:47:48 PM  Radiology DG Chest Port 1 View  Result Date: 05/04/2021 CLINICAL DATA:  Shortness of breath. EXAM: PORTABLE CHEST 1 VIEW COMPARISON:  Chest radiograph dated 02/13/2018. FINDINGS: Cardiomegaly with vascular congestion. No focal consolidation or pneumothorax. Small bilateral pleural effusions. No acute osseous pathology. IMPRESSION: Cardiomegaly with vascular congestion and small bilateral pleural  effusions. Electronically Signed   By: Anner Crete M.D.   On: 05/04/2021 00:06    Procedures Procedures   Medications Ordered in ED Medications  heparin ADULT infusion 100 units/mL (25000 units/212mL) (1,300 Units/hr Intravenous New Bag/Given 05/04/21 0029)  amiodarone (NEXTERONE) 1.8 mg/mL load via infusion 150 mg (has no administration in time range)    Followed by  amiodarone (NEXTERONE PREMIX) 360-4.14 MG/200ML-% (1.8 mg/mL) IV infusion (has no administration in time range)    Followed by  amiodarone (NEXTERONE PREMIX) 360-4.14 MG/200ML-% (1.8 mg/mL) IV infusion (has no administration in time range)  heparin bolus via infusion 5,000 Units (5,000 Units Intravenous Bolus from Bag 05/04/21 0030)    ED Course  I have reviewed the triage vital signs and the nursing notes.  Pertinent labs & imaging results that were available during my care of the patient were reviewed by me and considered in my medical decision making (see chart for details).    MDM Rules/Calculators/A&P     CHA2DS2-VASc Score: 2                     Patient presents to the emergency department for evaluation of shortness of breath.  Patient has had cough and chest congestion.  She carries a diagnosis of "reactive airway disease".  She has been using an inhaler for her cough and shortness of breath without improvement.  Her doctor prescribed a Z-Pak as well.  Patient's breathing has worsened.  EMS brought her to the ER, patient is in A. fib with rapid ventricular response at arrival.  CHA2DS2-VASc score is 2.  Will initiate empiric  anticoagulation.  It is unclear when the atrial fibrillation started.  Additionally she does report a history of undiagnosed palpitations.  Cannot rule out intermittent paroxysmal atrial fibrillation.  Rate control was not initiated at arrival as I did suspect CHF.  Patient does have cardiomegaly, cephalization on x-ray with an elevated BNP and elevated troponin.  This is consistent with  acute congestive heart failure.  Patient therefore initiated on amiodarone for rate control. Lasix for diuresis. Discussed with Dr. Humphrey Rolls, cardiology - will see patient.  CRITICAL CARE Performed by: Orpah Greek   Total critical care time: 32 minutes  Critical care time was exclusive of separately billable procedures and treating other patients.  Critical care was necessary to treat or prevent imminent or life-threatening deterioration.  Critical care was time spent personally by me on the following activities: development of treatment plan with patient and/or surrogate as well as nursing, discussions with consultants, evaluation of patient's response to treatment, examination of patient, obtaining history from patient or surrogate, ordering and performing treatments and interventions, ordering and review of laboratory studies, ordering and review of radiographic studies, pulse oximetry and re-evaluation of patient's condition.    Final Clinical Impression(s) / ED Diagnoses Final diagnoses:  Atrial fibrillation with RVR ([place])  Acute congestive heart failure, unspecified heart failure type [facility])    Rx / DC Orders ED Discharge Orders     None        Louie Flenner, Gwenyth Allegra, MD 05/04/21 0140

## 2021-05-03 NOTE — ED Triage Notes (Signed)
PT BIB [facility] for SOB x2wks.  Recently diagnosed with URI and given azithromycin via telemed visit. . Found to be in AFIB/RVR between 150-160 today by EMS.    PT has been using her rescue inhaler\.  EMS reports clear lung sounds. They captured 1 occurrence of 4 beat run of [place].   PT has hx of htn and takes metoprolol and amlodipine.  She lost her PCP so she is still taking metoprolol but not amlodipine.  PT was also dx with palpitations about a year ago.     EMS VS: 184/112, 98% RA, on 3L for comfort, HR 150-160.

## 2021-05-04 ENCOUNTER — Inpatient Hospital Stay (HOSPITAL_COMMUNITY): Payer: 59

## 2021-05-04 DIAGNOSIS — Z833 Family history of diabetes mellitus: Secondary | ICD-10-CM | POA: Diagnosis not present

## 2021-05-04 DIAGNOSIS — E876 Hypokalemia: Secondary | ICD-10-CM | POA: Diagnosis present

## 2021-05-04 DIAGNOSIS — I5021 Acute systolic (congestive) heart failure: Secondary | ICD-10-CM | POA: Diagnosis present

## 2021-05-04 DIAGNOSIS — I340 Nonrheumatic mitral (valve) insufficiency: Secondary | ICD-10-CM | POA: Diagnosis not present

## 2021-05-04 DIAGNOSIS — Z79899 Other long term (current) drug therapy: Secondary | ICD-10-CM | POA: Diagnosis not present

## 2021-05-04 DIAGNOSIS — I480 Paroxysmal atrial fibrillation: Secondary | ICD-10-CM

## 2021-05-04 DIAGNOSIS — R002 Palpitations: Secondary | ICD-10-CM | POA: Diagnosis present

## 2021-05-04 DIAGNOSIS — Q2112 Patent foramen ovale: Secondary | ICD-10-CM | POA: Diagnosis not present

## 2021-05-04 DIAGNOSIS — I110 Hypertensive heart disease with heart failure: Secondary | ICD-10-CM | POA: Diagnosis present

## 2021-05-04 DIAGNOSIS — I4819 Other persistent atrial fibrillation: Secondary | ICD-10-CM | POA: Diagnosis present

## 2021-05-04 DIAGNOSIS — N179 Acute kidney failure, unspecified: Secondary | ICD-10-CM | POA: Diagnosis present

## 2021-05-04 DIAGNOSIS — E669 Obesity, unspecified: Secondary | ICD-10-CM

## 2021-05-04 DIAGNOSIS — Z823 Family history of stroke: Secondary | ICD-10-CM | POA: Diagnosis not present

## 2021-05-04 DIAGNOSIS — I4891 Unspecified atrial fibrillation: Secondary | ICD-10-CM

## 2021-05-04 DIAGNOSIS — I10 Essential (primary) hypertension: Secondary | ICD-10-CM

## 2021-05-04 DIAGNOSIS — I059 Rheumatic mitral valve disease, unspecified: Secondary | ICD-10-CM | POA: Diagnosis present

## 2021-05-04 DIAGNOSIS — Z23 Encounter for immunization: Secondary | ICD-10-CM | POA: Diagnosis not present

## 2021-05-04 DIAGNOSIS — Z8249 Family history of ischemic heart disease and other diseases of the circulatory system: Secondary | ICD-10-CM | POA: Diagnosis not present

## 2021-05-04 DIAGNOSIS — Z888 Allergy status to other drugs, medicaments and biological substances status: Secondary | ICD-10-CM | POA: Diagnosis not present

## 2021-05-04 DIAGNOSIS — Z20822 Contact with and (suspected) exposure to covid-19: Secondary | ICD-10-CM | POA: Diagnosis present

## 2021-05-04 DIAGNOSIS — R059 Cough, unspecified: Secondary | ICD-10-CM | POA: Diagnosis present

## 2021-05-04 DIAGNOSIS — I428 Other cardiomyopathies: Secondary | ICD-10-CM | POA: Diagnosis present

## 2021-05-04 DIAGNOSIS — Z8049 Family history of malignant neoplasm of other genital organs: Secondary | ICD-10-CM | POA: Diagnosis not present

## 2021-05-04 DIAGNOSIS — I509 Heart failure, unspecified: Secondary | ICD-10-CM | POA: Insufficient documentation

## 2021-05-04 DIAGNOSIS — J45909 Unspecified asthma, uncomplicated: Secondary | ICD-10-CM | POA: Diagnosis present

## 2021-05-04 DIAGNOSIS — Z6841 Body Mass Index (BMI) 40.0 and over, adult: Secondary | ICD-10-CM | POA: Diagnosis not present

## 2021-05-04 LAB — CBC WITH DIFFERENTIAL/PLATELET
Abs Immature Granulocytes: 0.05 10*3/uL (ref 0.00–0.07)
Basophils Absolute: 0 10*3/uL (ref 0.0–0.1)
Basophils Relative: 0 %
Eosinophils Absolute: 0.1 10*3/uL (ref 0.0–0.5)
Eosinophils Relative: 1 %
HCT: 35.5 % — ABNORMAL LOW (ref 36.0–46.0)
Hemoglobin: 11.2 g/dL — ABNORMAL LOW (ref 12.0–15.0)
Immature Granulocytes: 1 %
Lymphocytes Relative: 24 %
Lymphs Abs: 2.3 10*3/uL (ref 0.7–4.0)
MCH: 30.4 pg (ref 26.0–34.0)
MCHC: 31.5 g/dL (ref 30.0–36.0)
MCV: 96.2 fL (ref 80.0–100.0)
Monocytes Absolute: 0.6 10*3/uL (ref 0.1–1.0)
Monocytes Relative: 7 %
Neutro Abs: 6.4 10*3/uL (ref 1.7–7.7)
Neutrophils Relative %: 67 %
Platelets: 397 10*3/uL (ref 150–400)
RBC: 3.69 MIL/uL — ABNORMAL LOW (ref 3.87–5.11)
RDW: 15.3 % (ref 11.5–15.5)
WBC: 9.5 10*3/uL (ref 4.0–10.5)
nRBC: 0 % (ref 0.0–0.2)

## 2021-05-04 LAB — CBC
HCT: 31.7 % — ABNORMAL LOW (ref 36.0–46.0)
Hemoglobin: 9.8 g/dL — ABNORMAL LOW (ref 12.0–15.0)
MCH: 30.2 pg (ref 26.0–34.0)
MCHC: 30.9 g/dL (ref 30.0–36.0)
MCV: 97.5 fL (ref 80.0–100.0)
Platelets: 360 10*3/uL (ref 150–400)
RBC: 3.25 MIL/uL — ABNORMAL LOW (ref 3.87–5.11)
RDW: 15.4 % (ref 11.5–15.5)
WBC: 9.7 10*3/uL (ref 4.0–10.5)
nRBC: 0 % (ref 0.0–0.2)

## 2021-05-04 LAB — BASIC METABOLIC PANEL
Anion gap: 9 (ref 5–15)
Anion gap: 9 (ref 5–15)
BUN: 17 mg/dL (ref 6–20)
BUN: 15 mg/dL (ref 6–20)
CO2: 20 mmol/L — ABNORMAL LOW (ref 22–32)
CO2: 22 mmol/L (ref 22–32)
Calcium: 10.1 mg/dL (ref 8.9–10.3)
Calcium: 9.4 mg/dL (ref 8.9–10.3)
Chloride: 105 mmol/L (ref 98–111)
Chloride: 106 mmol/L (ref 98–111)
Creatinine, Ser: 0.96 mg/dL (ref 0.44–1.00)
Creatinine, Ser: 0.92 mg/dL (ref 0.44–1.00)
GFR, Estimated: >60 mL/min (ref >60)
GFR, Estimated: >60 mL/min (ref >60)
Glucose, Bld: 173 mg/dL — ABNORMAL HIGH (ref 70–99)
Glucose, Bld: 142 mg/dL — ABNORMAL HIGH (ref 70–99)
Potassium: 3.5 mmol/L (ref 3.5–5.1)
Potassium: 3.3 mmol/L — ABNORMAL LOW (ref 3.5–5.1)
Sodium: 134 mmol/L — ABNORMAL LOW (ref 135–145)
Sodium: 137 mmol/L (ref 135–145)

## 2021-05-04 LAB — BRAIN NATRIURETIC PEPTIDE: B Natriuretic Peptide: 670 pg/mL — ABNORMAL HIGH (ref 0.0–100.0)

## 2021-05-04 LAB — TROPONIN I (HIGH SENSITIVITY)
Troponin I (High Sensitivity): 48 ng/L — ABNORMAL HIGH (ref <18)
Troponin I (High Sensitivity): 51 ng/L — ABNORMAL HIGH (ref <18)

## 2021-05-04 LAB — HIV ANTIBODY (ROUTINE TESTING W REFLEX): HIV Screen 4th Generation wRfx: NONREACTIVE

## 2021-05-04 LAB — RESP PANEL BY RT-PCR (FLU A&B, COVID) ARPGX2
Influenza A by PCR: NEGATIVE
Influenza B by PCR: NEGATIVE
SARS Coronavirus 2 by RT PCR: NEGATIVE

## 2021-05-04 LAB — ECHOCARDIOGRAM COMPLETE
Calc EF: 25.4 %
Height: 63 in
MV M vel: 5.77 m/s
MV Peak grad: 132.9 mmHg
Radius: 0.8 cm
S' Lateral: 3.8 cm
Single Plane A2C EF: 22.7 %
Single Plane A4C EF: 27.6 %
Weight: 4976 oz

## 2021-05-04 LAB — LACTIC ACID, PLASMA
Lactic Acid, Venous: 1.5 mmol/L (ref 0.5–1.9)
Lactic Acid, Venous: 1.7 mmol/L (ref 0.5–1.9)

## 2021-05-04 LAB — HEPARIN LEVEL (UNFRACTIONATED): Heparin Unfractionated: 0.3 IU/mL (ref 0.30–0.70)

## 2021-05-04 LAB — TSH: TSH: 1.563 u[IU]/mL (ref 0.350–4.500)

## 2021-05-04 MED ORDER — ALPRAZOLAM 0.5 MG PO TABS
0.2500 mg | ORAL_TABLET | Freq: Two times a day (BID) | ORAL | Status: DC | PRN
  Administered 2021-05-06 – 2021-05-13 (×8): 0.25 mg via ORAL
  Filled 2021-05-04 (×8): qty 1

## 2021-05-04 MED ORDER — ACETAMINOPHEN 325 MG PO TABS
650.0000 mg | ORAL_TABLET | ORAL | Status: DC | PRN
  Administered 2021-05-06 – 2021-05-13 (×9): 650 mg via ORAL
  Filled 2021-05-04 (×10): qty 2

## 2021-05-04 MED ORDER — DOCUSATE SODIUM 100 MG PO CAPS
100.0000 mg | ORAL_CAPSULE | Freq: Every day | ORAL | Status: DC | PRN
  Administered 2021-05-04: 100 mg via ORAL
  Administered 2021-05-09: 200 mg via ORAL
  Filled 2021-05-04 (×2): qty 1

## 2021-05-04 MED ORDER — PERFLUTREN LIPID MICROSPHERE
1.0000 mL | INTRAVENOUS | Status: AC | PRN
  Administered 2021-05-04: 4 mL via INTRAVENOUS
  Filled 2021-05-04: qty 10

## 2021-05-04 MED ORDER — ASPIRIN 81 MG PO CHEW
324.0000 mg | CHEWABLE_TABLET | Freq: Once | ORAL | Status: AC
  Administered 2021-05-04: 324 mg via ORAL
  Filled 2021-05-04: qty 4

## 2021-05-04 MED ORDER — AMIODARONE LOAD VIA INFUSION
150.0000 mg | Freq: Once | INTRAVENOUS | Status: AC
  Administered 2021-05-04: 150 mg via INTRAVENOUS
  Filled 2021-05-04: qty 83.34

## 2021-05-04 MED ORDER — FUROSEMIDE 10 MG/ML IJ SOLN
40.0000 mg | Freq: Two times a day (BID) | INTRAMUSCULAR | Status: DC
  Administered 2021-05-04 (×2): 40 mg via INTRAVENOUS
  Filled 2021-05-04 (×3): qty 4

## 2021-05-04 MED ORDER — AMIODARONE HCL IN DEXTROSE 360-4.14 MG/200ML-% IV SOLN
30.0000 mg/h | INTRAVENOUS | Status: DC
  Administered 2021-05-04 – 2021-05-05 (×4): 30 mg/h via INTRAVENOUS
  Administered 2021-05-06 – 2021-05-09 (×13): 60 mg/h via INTRAVENOUS
  Administered 2021-05-10: 30 mg/h via INTRAVENOUS
  Administered 2021-05-10: 60 mg/h via INTRAVENOUS
  Administered 2021-05-10 – 2021-05-13 (×6): 30 mg/h via INTRAVENOUS
  Filled 2021-05-04 (×24): qty 200

## 2021-05-04 MED ORDER — ONDANSETRON HCL 4 MG/2ML IJ SOLN: 4.0000 mg | Freq: Four times a day (QID) | INTRAMUSCULAR | Status: DC | PRN

## 2021-05-04 MED ORDER — ALBUTEROL SULFATE (2.5 MG/3ML) 0.083% IN NEBU
2.5000 mg | INHALATION_SOLUTION | Freq: Four times a day (QID) | RESPIRATORY_TRACT | Status: DC | PRN
  Administered 2021-05-04: 2.5 mg via RESPIRATORY_TRACT
  Filled 2021-05-04: qty 3

## 2021-05-04 MED ORDER — BENZONATATE 100 MG PO CAPS: 100.0000 mg | ORAL_CAPSULE | Freq: Three times a day (TID) | ORAL | Status: DC | PRN

## 2021-05-04 MED ORDER — POTASSIUM CHLORIDE CRYS ER 20 MEQ PO TBCR
20.0000 meq | EXTENDED_RELEASE_TABLET | Freq: Once | ORAL | Status: AC
  Administered 2021-05-04: 20 meq via ORAL
  Filled 2021-05-04: qty 2
  Filled 2021-05-04: qty 1

## 2021-05-04 MED ORDER — FUROSEMIDE 10 MG/ML IJ SOLN
20.0000 mg | Freq: Once | INTRAMUSCULAR | Status: AC
  Administered 2021-05-04: 20 mg via INTRAVENOUS
  Filled 2021-05-04: qty 2

## 2021-05-04 MED ORDER — GUAIFENESIN ER 600 MG PO TB12
600.0000 mg | ORAL_TABLET | Freq: Two times a day (BID) | ORAL | Status: DC | PRN
  Administered 2021-05-04: 600 mg via ORAL
  Filled 2021-05-04: qty 1

## 2021-05-04 MED ORDER — AMIODARONE HCL IN DEXTROSE 360-4.14 MG/200ML-% IV SOLN
60.0000 mg/h | INTRAVENOUS | Status: AC
  Administered 2021-05-04 (×2): 60 mg/h via INTRAVENOUS
  Filled 2021-05-04 (×2): qty 200

## 2021-05-04 NOTE — Progress Notes (Signed)
    Seen in the ER today- full H&P by the overnight cardiology fellow in the chart. She is still dyspneic - sitting up in bed. Remains in afib with RVR - amiodarone was started, however, onset of afib is unknown (sometime in the past 2-4 weeks) - therefore, there is risk of conversion not being fully anticoagulated. No availability for TEE/DCCV today - will allow low salt/cardiac diet - NPO p MN for possible case tomorrow. Needs more diuresis.  Pixie Casino, MD, [facility] [place] Director of the [facility] Lipid Disorders &  Cardiovascular Risk Reduction Clinic Diplomate of the American Board of Clinical Lipidology Attending Cardiologist  Direct Dial: [phone]  Fax: [phone]  Website:  [email or web address]

## 2021-05-04 NOTE — Progress Notes (Signed)
ANTICOAGULATION CONSULT NOTE   Pharmacy Consult for Heparin  Indication: New onset atrial fibrillation  Allergies  Allergen Reactions   Lisinopril-Hydrochlorothiazide Anaphylaxis   Other Anaphylaxis   Lisinopril     Other reaction(s): Angioedema    Patient Measurements: Height: 5\' 3"  (160 cm) Weight: (!) 141.1 kg (311 lb) IBW/kg (Calculated) : 52.4   Vital Signs: Temp: 98.1 F (36.7 C) (10/23 2345) Temp Source: Oral (10/23 2345) BP: 143/101 (10/24 0831) Pulse Rate: 124 (10/24 0831)  Labs: Recent Labs    05/04/21 0007 05/04/21 0149 05/04/21 0404 05/04/21 0812  HGB 11.2*  --  9.8*  --   HCT 35.5*  --  31.7*  --   PLT 397  --  360  --   HEPARINUNFRC  --   --   --  0.30  CREATININE 0.96  --  0.92  --   TROPONINIHS 48* 51*  --   --      Estimated Creatinine Clearance: 100.4 mL/min (by C-G formula based on SCr of 0.92 mg/dL).   Medical History: Past Medical History:  Diagnosis Date   Abnormal Pap smear of cervix    Allergy    Anemia    Fibroid    Hypertension    Leukocytosis, unspecified 10/18/2013   Obesity    Plantar fasciitis      Assessment: 50 y/o F with new onset atrial fibrillation. Starting heparin. Hgb 11.2, plts good. PTA meds reviewed.   Initial heparin level low end therapeutic on 1300 units/hr  Goal of Therapy:  Heparin level 0.3-0.7 units/ml Monitor platelets by anticoagulation protocol: Yes   Plan:  Slight increase in heparin gtt to 1350 units/hr Daily heparin level, CBC, s/s bleeding F/u long term AC plan and ability to transition to PO  Bertis Ruddy, PharmD Clinical Pharmacist ED Pharmacist Phone # [phone] 05/04/2021 9:51 AM

## 2021-05-04 NOTE — ED Notes (Addendum)
Paged on call cardiology about pt BP and stool softener.

## 2021-05-04 NOTE — ED Notes (Signed)
Provided pt with recliner for better comfort. Pt able to stand without assistance. With exertion pt becomes tachycardic of 130's, tachypnic ,SOB. SpO2 remained 95% with 3liters [place]. Once settled heart rate remained 112. Pt verbalized comfort . Pt proved water and diet soda.

## 2021-05-04 NOTE — Progress Notes (Signed)
ANTICOAGULATION CONSULT NOTE - Initial Consult  Pharmacy Consult for Heparin  Indication: New onset atrial fibrillation  Allergies  Allergen Reactions   Lisinopril-Hydrochlorothiazide Anaphylaxis   Other Anaphylaxis   Lisinopril     Other reaction(s): Angioedema    Patient Measurements: Height: 5\' 3"  (160 cm) Weight: (!) 141.1 kg (311 lb) IBW/kg (Calculated) : 52.4   Vital Signs: Temp: 98.1 F (36.7 C) (10/23 2345) Temp Source: Oral (10/23 2345) BP: 156/130 (10/24 0015) Pulse Rate: 79 (10/24 0015)  Labs: Recent Labs    05/04/21 0007  HGB 11.2*  HCT 35.5*  PLT 397    CrCl cannot be calculated (Patient's most recent lab result is older than the maximum 21 days allowed.).   Medical History: Past Medical History:  Diagnosis Date   Abnormal Pap smear of cervix    Allergy    Anemia    Fibroid    Hypertension    Leukocytosis, unspecified 10/18/2013   Obesity    Plantar fasciitis      Assessment: 50 y/o F with new onset atrial fibrillation. Starting heparin. Hgb 11.2, plts good. PTA meds reviewed.   Goal of Therapy:  Heparin level 0.3-0.7 units/ml Monitor platelets by anticoagulation protocol: Yes   Plan:  Heparin 5000 units BOLUS Start heparin drip at 1300 units/hr 8 hour heparin level Daily CBC/Heparin level Monitor for bleeding   Narda Bonds, PharmD, BCPS Clinical Pharmacist Phone: [phone]

## 2021-05-04 NOTE — H&P (Signed)
Cardiology Admission History and Physical:   Patient ID: KLYN KROENING MRN: [id number]; DOB: February 01, 1971   Admission date: 05/03/2021  PCP:  Maximiano Coss, NP   [facility] Providers Cardiologist:  None        Chief Complaint: Palpitations and SOB  Patient Profile:   Christine Oneal is a 50 y.o. female with pmh sx for HTN and obesity who is being seen 05/04/2021 for the evaluation of palpitations and DOE.   History of Present Illness:   Christine Oneal is a 50 y.o. female with pmh sx for HTN and obesity who is being seen 05/04/2021 for the evaluation of palpitations and DOE. Patient reports her symptoms have been ongoing since some weeks but they worsened today hence came to the ED. Patient had a virtual visit with her doctor, was told she had an upper respiratory infection, was given Zithromax.  Breathing worsened tonight.  Patient called EMS to have identified the patient as being in atrial fibrillation with a rapid ventricular response.  Patient has no history of A. fib. She does she has history of palpitations for which she took metoprolol and probiotics. She has experienced some PND and orthopnea, and leg swelling. In the ED, her troponin were mildly high 50 and BNP was 700. Ekg showed Afib and she was started on amio drip. And cardiology was called for admission. She denies any chest pain; says her breathing is getting better.    Past Medical History:  Diagnosis Date   Abnormal Pap smear of cervix    Allergy    Anemia    Fibroid    Hypertension    Leukocytosis, unspecified 10/18/2013   Obesity    Plantar fasciitis     Past Surgical History:  Procedure Laterality Date   COLPOSCOPY     TUBAL LIGATION  2001     Medications Prior to Admission: Prior to Admission medications   Medication Sig Start Date End Date Taking? Authorizing Provider  albuterol (VENTOLIN HFA) 108 (90 Base) MCG/ACT inhaler Inhale 2 puffs into the lungs every 6 (six) hours as needed for wheezing or  shortness of breath. 09/03/20  Yes Maximiano Coss, NP  benzonatate (TESSALON PERLES) 100 MG capsule Take 1 capsule (100 mg total) by mouth 3 (three) times daily as needed for cough. 04/29/21  Yes Martin, Mary-Margaret, FNP  guaiFENesin (MUCINEX) 600 MG 12 hr tablet Take 600 mg by mouth 2 (two) times daily as needed for cough.   Yes [provider]  Probiotic CHEW Chew 2 capsules by mouth daily.   Yes [provider]  amLODipine (NORVASC) 10 MG tablet Take 1 tablet (10 mg total) by mouth daily. 10/17/19   Wendall Mola, NP  atenolol (TENORMIN) 50 MG tablet TAKE 1 TABLET BY MOUTH EVERY DAY Patient not taking: No sig reported 09/20/19   Forrest Moron, MD  azithromycin (ZITHROMAX Z-PAK) 250 MG tablet As directed Patient not taking: No sig reported 04/29/21   Hassell Done, Mary-Margaret, FNP  metoprolol succinate (TOPROL-XL) 50 MG 24 hr tablet Take 1 tablet (50 mg total) by mouth daily. Take with or immediately following a meal. 02/05/20   Maximiano Coss, NP  predniSONE (STERAPRED UNI-PAK 21 TAB) 10 MG (21) TBPK tablet Use as directed Patient not taking: Reported on 05/04/2021 07/13/20   Sharion Balloon, FNP     Allergies:    Allergies  Allergen Reactions   Lisinopril-Hydrochlorothiazide Anaphylaxis   Other Anaphylaxis   Lisinopril     Other reaction(s): Angioedema  Social History:   Social History   Socioeconomic History   Marital status: Divorced    Spouse name: Not on file   Number of children: 4   Years of education: Not on file   Highest education level: Not on file  Occupational History   Occupation: CNA    Employer: Information systems manager   Tobacco Use   Smoking status: Never   Smokeless tobacco: Never  Vaping Use   Vaping Use: Never used  Substance and Sexual Activity   Alcohol use: No   Drug use: No   Sexual activity: Yes    Partners: Male    Birth control/protection: Surgical    Comment: BTL  Other Topics Concern   Not on file  Social History Narrative    Christine Oneal has 4 children. Three live at home with her.   Social Determinants of Health   Financial Resource Strain: Not on file  Food Insecurity: Not on file  Transportation Needs: Not on file  Physical Activity: Not on file  Stress: Not on file  Social Connections: Not on file  Intimate Partner Violence: Not on file    Family History:   The patient's family history includes Cervical cancer in her mother; Diabetes in her sister; Hypertension in her brother, brother, father, and mother; Stroke in her father; Uterine cancer in her maternal grandmother.    ROS:  Please see the history of present illness.  All other ROS reviewed and negative.     Physical Exam/Data:   Vitals:   05/04/21 0015 05/04/21 0112 05/04/21 0130 05/04/21 0200  BP: (!) 156/130  (!) 153/133 (!) 153/103  Pulse: 79 (!) 128 (!) 114 (!) 133  Resp: (!) 21 (!) 42 16 (!) 32  Temp:      TempSrc:      SpO2: 100% 100% 100% 98%  Weight:      Height:       No intake or output data in the 24 hours ending 05/04/21 0231 Last 3 Weights 05/03/2021 02/05/2020 10/17/2019  Weight (lbs) 311 lb 312 lb 6.4 oz 307 lb  Weight (kg) 141.069 kg 141.704 kg 139.254 kg     Body mass index is 55.09 kg/m.  General:  Well nourished, well developed, in mild distress HEENT: normal Neck: Hard to appreciate JVD Vascular: No carotid bruits; Distal pulses 2+ bilaterally   Cardiac:  Tachycardic irregular Lungs:  Crackles present at bases Abd: soft, nontender, no hepatomegaly  Ext: Pitting edema Musculoskeletal:  No deformities, BUE and BLE strength normal and equal Skin: warm and dry  Neuro:  CNs 2-12 intact, no focal abnormalities noted Psych:  Normal affect    EKG:  The ECG that was done showed Afib RVR   Laboratory Data:  High Sensitivity Troponin:   Recent Labs  Lab 05/04/21 0007  TROPONINIHS 48*      Chemistry Recent Labs  Lab 05/04/21 0007  NA 134*  K 3.5  CL 105  CO2 20*  GLUCOSE 173*  BUN 17  CREATININE 0.96   CALCIUM 10.1  GFRNONAA >60  ANIONGAP 9    No results for input(s): PROT, ALBUMIN, AST, ALT, ALKPHOS, BILITOT in the last 168 hours. Lipids No results for input(s): CHOL, TRIG, HDL, LABVLDL, LDLCALC, CHOLHDL in the last 168 hours. Hematology Recent Labs  Lab 05/04/21 0007  WBC 9.5  RBC 3.69*  HGB 11.2*  HCT 35.5*  MCV 96.2  MCH 30.4  MCHC 31.5  RDW 15.3  PLT 397   Thyroid No results for  input(s): TSH, FREET4 in the last 168 hours. BNP Recent Labs  Lab 05/03/21 2349  BNP 670.0*    DDimer No results for input(s): DDIMER in the last 168 hours.   Radiology/Studies:  DG Chest Port 1 View  Result Date: 05/04/2021 CLINICAL DATA:  Shortness of breath. EXAM: PORTABLE CHEST 1 VIEW COMPARISON:  Chest radiograph dated 02/13/2018. FINDINGS: Cardiomegaly with vascular congestion. No focal consolidation or pneumothorax. Small bilateral pleural effusions. No acute osseous pathology. IMPRESSION: Cardiomegaly with vascular congestion and small bilateral pleural effusions. Electronically Signed   By: Anner Crete M.D.   On: 05/04/2021 00:06     Assessment and Plan:   # Afib RVR (paroxysmal) # Acute Heart Failure  -Echo in AM to assess for EF -IV Lasix 40 BID -K >4 and Mg >2 -Avoid BB and diltiazem for now in acute HF -Continue Amio drip started in ED -Start Heparin drip -TEE along with cardioversion in AM -Elliquis at discharge -Sleep study as outpatient; check TSH and lactate   # HTN: Hold medications for now # Obesity: Encourage weight loss    Signed, Jaci Lazier, MD  05/04/2021 2:32 AM

## 2021-05-04 NOTE — ED Notes (Signed)
The patient was requesting a stool softener

## 2021-05-04 NOTE — ED Notes (Signed)
Pt sitting up comfortably eating dinner.

## 2021-05-04 NOTE — ED Notes (Signed)
Mang PA made aware of pt BP.

## 2021-05-05 ENCOUNTER — Inpatient Hospital Stay (HOSPITAL_COMMUNITY): Payer: 59

## 2021-05-05 ENCOUNTER — Inpatient Hospital Stay: Payer: Self-pay

## 2021-05-05 ENCOUNTER — Other Ambulatory Visit (HOSPITAL_COMMUNITY): Payer: Medicaid Other

## 2021-05-05 DIAGNOSIS — R570 Cardiogenic shock: Secondary | ICD-10-CM

## 2021-05-05 DIAGNOSIS — I340 Nonrheumatic mitral (valve) insufficiency: Secondary | ICD-10-CM

## 2021-05-05 DIAGNOSIS — I5021 Acute systolic (congestive) heart failure: Secondary | ICD-10-CM | POA: Diagnosis present

## 2021-05-05 LAB — CBC
HCT: 31.6 % — ABNORMAL LOW (ref 36.0–46.0)
Hemoglobin: 9.9 g/dL — ABNORMAL LOW (ref 12.0–15.0)
MCH: 30.1 pg (ref 26.0–34.0)
MCHC: 31.3 g/dL (ref 30.0–36.0)
MCV: 96 fL (ref 80.0–100.0)
Platelets: 343 10*3/uL (ref 150–400)
RBC: 3.29 MIL/uL — ABNORMAL LOW (ref 3.87–5.11)
RDW: 15.3 % (ref 11.5–15.5)
WBC: 10.6 10*3/uL — ABNORMAL HIGH (ref 4.0–10.5)
nRBC: 0 % (ref 0.0–0.2)

## 2021-05-05 LAB — COOXEMETRY PANEL
Carboxyhemoglobin: 1.1 % (ref 0.5–1.5)
Methemoglobin: 0.7 % (ref 0.0–1.5)
O2 Saturation: 59.8 %
Total hemoglobin: 14 g/dL (ref 12.0–16.0)

## 2021-05-05 LAB — BASIC METABOLIC PANEL
Anion gap: 11 (ref 5–15)
BUN: 21 mg/dL — ABNORMAL HIGH (ref 6–20)
CO2: 24 mmol/L (ref 22–32)
Calcium: 10.3 mg/dL (ref 8.9–10.3)
Chloride: 101 mmol/L (ref 98–111)
Creatinine, Ser: 1.06 mg/dL — ABNORMAL HIGH (ref 0.44–1.00)
GFR, Estimated: >60 mL/min (ref >60)
Glucose, Bld: 112 mg/dL — ABNORMAL HIGH (ref 70–99)
Potassium: 3.5 mmol/L (ref 3.5–5.1)
Sodium: 136 mmol/L (ref 135–145)

## 2021-05-05 LAB — HEPARIN LEVEL (UNFRACTIONATED): Heparin Unfractionated: 0.41 IU/mL (ref 0.30–0.70)

## 2021-05-05 LAB — PROTIME-INR
INR: 1.2 (ref 0.8–1.2)
Prothrombin Time: 15.6 seconds — ABNORMAL HIGH (ref 11.4–15.2)

## 2021-05-05 MED ORDER — MILRINONE LACTATE IN DEXTROSE 20-5 MG/100ML-% IV SOLN
0.1250 ug/kg/min | INTRAVENOUS | Status: DC
  Administered 2021-05-05 – 2021-05-06 (×2): 0.25 ug/kg/min via INTRAVENOUS
  Administered 2021-05-07 – 2021-05-11 (×6): 0.125 ug/kg/min via INTRAVENOUS
  Filled 2021-05-05 (×9): qty 100

## 2021-05-05 MED ORDER — FUROSEMIDE 10 MG/ML IJ SOLN
40.0000 mg | Freq: Three times a day (TID) | INTRAMUSCULAR | Status: DC
  Administered 2021-05-05 – 2021-05-08 (×10): 40 mg via INTRAVENOUS
  Filled 2021-05-05 (×9): qty 4

## 2021-05-05 MED ORDER — SODIUM CHLORIDE 0.9% FLUSH
10.0000 mL | INTRAVENOUS | Status: DC | PRN
  Administered 2021-05-10: 10 mL

## 2021-05-05 MED ORDER — SODIUM CHLORIDE 0.9% FLUSH
10.0000 mL | Freq: Two times a day (BID) | INTRAVENOUS | Status: DC
Start: 2021-05-05 — End: 2021-05-14
  Administered 2021-05-06 – 2021-05-14 (×8): 10 mL

## 2021-05-05 MED ORDER — CHLORHEXIDINE GLUCONATE CLOTH 2 % EX PADS
6.0000 | MEDICATED_PAD | Freq: Every day | CUTANEOUS | Status: DC
  Administered 2021-05-06 – 2021-05-13 (×8): 6 via TOPICAL

## 2021-05-05 NOTE — Plan of Care (Signed)

## 2021-05-05 NOTE — Progress Notes (Addendum)
DAILY PROGRESS NOTE   Patient Name: JISELE PRICE Date of Encounter: 05/05/2021 Cardiologist: None  Chief Complaint   Shortness of breath  Patient Profile   KEENAN DIMITROV is a 50 y.o. female with pmh sx for HTN and obesity who is being seen 05/04/2021 for the evaluation of palpitations and DOE.   Subjective   Ms. Vallely reports continued shortness of breath however has had some minimal improvement.  Ins and outs not accurately recorded however she noted urinating frequently yesterday.  Unfortunately her echo shows a significant cardiomyopathy.  LVEF is less than 20% with apical akinesis.  There is significant trabeculation at the apex but no clear large mobile thrombus.  There is mild biatrial enlargement with severe mitral regurgitation.  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  She also could have developed severe MR leading to her A. fib however I do not appreciate any significant valvular pathology.  This appears to be functional MR secondary to global hypokinesis and LV dilatation.  Based on combination of low EF and severe MR, her cardiac output is likely severely low.  I discussed the case with Dr. Haroldine Laws.  He would recommend waiting on TEE cardioversion until she is better compensated with regards to her heart failure.  Objective   Vitals:   05/05/21 0300 05/05/21 0330 05/05/21 0500 05/05/21 0630  BP: 140/75 (!) 127/91 (!) 151/103 (!) 133/99  Pulse: (!) 27 91 88 (!) 101  Resp: (!) 24 (!) 24 (!) 22 (!) 25  Temp:      TempSrc:      SpO2: 98% 100% 100% 100%  Weight:      Height:        Intake/Output Summary (Last 24 hours) at 05/05/2021 0900 Last data filed at 05/04/2021 1221 Gross per 24 hour  Intake 197.78 ml  Output --  Net 197.78 ml   Filed Weights   05/03/21 2351  Weight: (!) 141.1 kg    Physical Exam   General  appearance: alert, no distress, and morbidly obese Neck: JVD - several cm above sternal notch, no carotid bruit, and thyroid not enlarged, symmetric, no tenderness/mass/nodules Lungs: diminished breath sounds bibasilar Heart: irregularly irregular rhythm, S3 present, and systolic murmur: holosystolic 3/6, blowing at apex Abdomen: soft, non-tender; bowel sounds normal; no masses,  no organomegaly and obese Extremities: edema 1+ bilateral LE Pulses: 2+ and symmetric Skin: Skin color, texture, turgor normal. No rashes or lesions Neurologic: Mental status: Alert, oriented, thought content appropriate Psych: Pleasant  Inpatient Medications    Scheduled Meds:  furosemide  40 mg Intravenous TID    Continuous Infusions:  amiodarone 30 mg/hr (05/05/21 0714)   heparin 1,350 Units/hr (05/05/21 0714)    PRN Meds: acetaminophen, albuterol, ALPRAZolam, benzonatate, docusate sodium, guaiFENesin, ondansetron (ZOFRAN) IV   Labs   Results for orders placed or performed during the hospital encounter of 05/03/21 (from the past 48 hour(s))  Brain natriuretic peptide     Status: Abnormal   Collection Time: 05/03/21 11:49 PM  Result Value Ref Range   B Natriuretic Peptide 670.0 (H) 0.0 - 100.0 pg/mL    Comment: Performed at [facility], [address], [place], [place] [address]  CBC with Differential/Platelet     Status: Abnormal   Collection Time: 05/04/21 12:07 AM  Result Value Ref Range   WBC 9.5 4.0 - 10.5 K/uL  RBC 3.69 (L) 3.87 - 5.11 MIL/uL   Hemoglobin 11.2 (L) 12.0 - 15.0 g/dL   HCT 35.5 (L) 36.0 - 46.0 %   MCV 96.2 80.0 - 100.0 fL   MCH 30.4 26.0 - 34.0 pg   MCHC 31.5 30.0 - 36.0 g/dL   RDW 15.3 11.5 - 15.5 %   Platelets 397 150 - 400 K/uL   nRBC 0.0 0.0 - 0.2 %   Neutrophils Relative % 67 %   Neutro Abs 6.4 1.7 - 7.7 K/uL   Lymphocytes Relative 24 %   Lymphs Abs 2.3 0.7 - 4.0 K/uL   Monocytes Relative 7 %   Monocytes Absolute 0.6 0.1 - 1.0 K/uL   Eosinophils Relative  1 %   Eosinophils Absolute 0.1 0.0 - 0.5 K/uL   Basophils Relative 0 %   Basophils Absolute 0.0 0.0 - 0.1 K/uL   Immature Granulocytes 1 %   Abs Immature Granulocytes 0.05 0.00 - 0.07 K/uL    Comment: Performed at [facility] [address] [place], [place] [address]  Basic metabolic panel     Status: Abnormal   Collection Time: 05/04/21 12:07 AM  Result Value Ref Range   Sodium 134 (L) 135 - 145 mmol/L   Potassium 3.5 3.5 - 5.1 mmol/L   [place] 105 98 - 111 mmol/L   CO2 20 (L) 22 - 32 mmol/L   Glucose, Bld 173 (H) 70 - 99 mg/dL    Comment: Glucose reference range applies only to samples taken after fasting for at least 8 hours.   BUN 17 6 - 20 mg/dL   Creatinine, Ser 0.96 0.44 - 1.00 mg/dL   Calcium 10.1 8.9 - 10.3 mg/dL   GFR, Estimated >60 >60 mL/min    Comment: (NOTE) Calculated using the CKD-EPI Creatinine Equation (2021)    Anion gap 9 5 - 15    Comment: Performed at [facility] [address], [place], [place] [address]  Troponin I (High Sensitivity)     Status: Abnormal   Collection Time: 05/04/21 12:07 AM  Result Value Ref Range   Troponin I (High Sensitivity) 48 (H) <18 ng/L    Comment: (NOTE) Elevated high sensitivity troponin I (hsTnI) values and significant  changes across serial measurements may suggest ACS but many other  chronic and acute conditions are known to elevate hsTnI results.  Refer to the "Links" section for chest pain algorithms and additional  guidance. Performed at [facility], [place] [address], [place], [place] [address]   Troponin I (High Sensitivity)     Status: Abnormal   Collection Time: 05/04/21  1:49 AM  Result Value Ref Range   Troponin I (High Sensitivity) 51 (H) <18 ng/L    Comment: (NOTE) Elevated high sensitivity troponin I (hsTnI) values and significant  changes across serial measurements may suggest ACS but many other  chronic and acute conditions are known to elevate hsTnI results.  Refer to the "Links"  section for chest pain algorithms and additional  guidance. Performed at [facility], [place] [address] [place],  [address]   Resp Panel by RT-PCR (Flu A&B, Covid) Nasopharyngeal Swab     Status: None   Collection Time: 05/04/21  4:04 AM   Specimen: Nasopharyngeal Swab; Nasopharyngeal(NP) swabs in vial transport medium  Result Value Ref Range   SARS Coronavirus 2 by RT PCR NEGATIVE NEGATIVE    Comment: (NOTE) SARS-CoV-2 target nucleic acids are NOT DETECTED.  The SARS-CoV-2 RNA  is generally detectable in upper respiratory specimens during the acute phase of infection. The lowest concentration of SARS-CoV-2 viral copies this assay can detect is 138 copies/mL. A negative result does not preclude SARS-Cov-2 infection and should not be used as the sole basis for treatment or other patient management decisions. A negative result may occur with  improper specimen collection/handling, submission of specimen other than nasopharyngeal swab, presence of viral mutation(s) within the areas targeted by this assay, and inadequate number of viral copies(<138 copies/mL). A negative result must be combined with clinical observations, patient history, and epidemiological information. The expected result is Negative.  Fact Sheet for Patients:  EntrepreneurPulse.com.au  Fact Sheet for Healthcare Providers:  IncredibleEmployment.be  This test is no t yet approved or cleared by the [place] FDA and  has been authorized for detection and/or diagnosis of SARS-CoV-2 by FDA under an Emergency Use Authorization (EUA). This EUA will remain  in effect (meaning this test can be used) for the duration of the COVID-19 declaration under Section 564(b)(1) of the Act, 21 U.S.C.section 360bbb-3(b)(1), unless the authorization is terminated  or revoked sooner.       Influenza A by PCR NEGATIVE NEGATIVE   Influenza B by PCR NEGATIVE NEGATIVE    Comment:  (NOTE) The Xpert Xpress SARS-CoV-2/FLU/RSV plus assay is intended as an aid in the diagnosis of influenza from Nasopharyngeal swab specimens and should not be used as a sole basis for treatment. Nasal washings and aspirates are unacceptable for Xpert Xpress SARS-CoV-2/FLU/RSV testing.  Fact Sheet for Patients: EntrepreneurPulse.com.au  Fact Sheet for Healthcare Providers: IncredibleEmployment.be  This test is not yet approved or cleared by the [place] FDA and has been authorized for detection and/or diagnosis of SARS-CoV-2 by FDA under an Emergency Use Authorization (EUA). This EUA will remain in effect (meaning this test can be used) for the duration of the COVID-19 declaration under Section 564(b)(1) of the Act, 21 U.S.C. section 360bbb-3(b)(1), unless the authorization is terminated or revoked.  Performed at [facility], [place] [address] [place], [place] [address]   Basic metabolic panel     Status: Abnormal   Collection Time: 05/04/21  4:04 AM  Result Value Ref Range   Sodium 137 135 - 145 mmol/L   Potassium 3.3 (L) 3.5 - 5.1 mmol/L   Chloride 106 98 - 111 mmol/L   CO2 22 22 - 32 mmol/L   Glucose, Bld 142 (H) 70 - 99 mg/dL    Comment: Glucose reference range applies only to samples taken after fasting for at least 8 hours.   BUN 15 6 - 20 mg/dL   Creatinine, Ser 0.92 0.44 - 1.00 mg/dL   Calcium 9.4 8.9 - 10.3 mg/dL   GFR, Estimated >60 >60 mL/min    Comment: (NOTE) Calculated using the CKD-EPI Creatinine Equation (2021)    Anion gap 9 5 - 15    Comment: Performed at [facility] [address], [place], [place] [address]  CBC     Status: Abnormal   Collection Time: 05/04/21  4:04 AM  Result Value Ref Range   WBC 9.7 4.0 - 10.5 K/uL   RBC 3.25 (L) 3.87 - 5.11 MIL/uL   Hemoglobin 9.8 (L) 12.0 - 15.0 g/dL   HCT 31.7 (L) 36.0 - 46.0 %   MCV 97.5 80.0 - 100.0 fL   MCH 30.2 26.0 - 34.0 pg   MCHC 30.9 30.0 - 36.0  g/dL   RDW 15.4 11.5 - 15.5 %  Platelets 360 150 - 400 K/uL   nRBC 0.0 0.0 - 0.2 %    Comment: Performed at [facility], [place] [address] [place], [place] [address]  TSH     Status: None   Collection Time: 05/04/21  4:04 AM  Result Value Ref Range   TSH 1.563 0.350 - 4.500 uIU/mL    Comment: Performed by a 3rd Generation assay with a functional sensitivity of <=0.01 uIU/mL. Performed at [facility], [place] [address] [place], [place] [address]   Lactic acid, plasma     Status: None   Collection Time: 05/04/21  4:04 AM  Result Value Ref Range   Lactic Acid, Venous 1.5 0.5 - 1.9 mmol/L    Comment: Performed at [facility] [address], [place], [place] [address]  HIV Antibody (routine testing w rflx)     Status: None   Collection Time: 05/04/21  4:04 AM  Result Value Ref Range   HIV Screen 4th Generation wRfx Non Reactive Non Reactive    Comment: Performed at [facility], [place] [address] [place], [place] [address]  Heparin level (unfractionated)     Status: None   Collection Time: 05/04/21  8:12 AM  Result Value Ref Range   Heparin Unfractionated 0.30 0.30 - 0.70 IU/mL    Comment: (NOTE) The clinical reportable range upper limit is being lowered to >1.10 to align with the FDA approved guidance for the current laboratory assay.  If heparin results are below expected values, and patient dosage has  been confirmed, suggest follow up testing of antithrombin III levels. Performed at [facility], [place] [address], [place], [place] [address]   Lactic acid, plasma     Status: None   Collection Time: 05/04/21  8:12 AM  Result Value Ref Range   Lactic Acid, Venous 1.7 0.5 - 1.9 mmol/L    Comment: Performed at [facility] [address], [place], [place] [address]  CBC     Status: Abnormal   Collection Time: 05/05/21  5:50 AM  Result Value Ref Range   WBC 10.6 (H) 4.0 - 10.5 K/uL   RBC 3.29 (L) 3.87 - 5.11 MIL/uL   Hemoglobin 9.9 (L) 12.0 -  15.0 g/dL   HCT 31.6 (L) 36.0 - 46.0 %   MCV 96.0 80.0 - 100.0 fL   MCH 30.1 26.0 - 34.0 pg   MCHC 31.3 30.0 - 36.0 g/dL   RDW 15.3 11.5 - 15.5 %   Platelets 343 150 - 400 K/uL   nRBC 0.0 0.0 - 0.2 %    Comment: Performed at [facility], [place] [address], [place], [place] [address]  Heparin level (unfractionated)     Status: None   Collection Time: 05/05/21  5:50 AM  Result Value Ref Range   Heparin Unfractionated 0.41 0.30 - 0.70 IU/mL    Comment: (NOTE) The clinical reportable range upper limit is being lowered to >1.10 to align with the FDA approved guidance for the current laboratory assay.  If heparin results are below expected values, and patient dosage has  been confirmed, suggest follow up testing of antithrombin III levels. Performed at [facility], [place] [address], [place], [place] [address]     ECG   N/A  Telemetry   AFib with RVR - Personally Reviewed  Radiology    DG Chest Port 1 View  Result Date: 05/04/2021 CLINICAL DATA:  Shortness of breath. EXAM:  PORTABLE CHEST 1 VIEW COMPARISON:  Chest radiograph dated 02/13/2018. FINDINGS: Cardiomegaly with vascular congestion. No focal consolidation or pneumothorax. Small bilateral pleural effusions. No acute osseous pathology. IMPRESSION: Cardiomegaly with vascular congestion and small bilateral pleural effusions. Electronically Signed   By: Anner Crete M.D.   On: 05/04/2021 00:06   ECHOCARDIOGRAM COMPLETE  Result Date: 05/04/2021    ECHOCARDIOGRAM REPORT   Patient Name:   KENZINGTON MIELKE Date of Exam: 05/04/2021 Medical Rec #:  [id number]        Height:       63.0 in Accession #:    [id number]       Weight:       311.0 lb Date of Birth:  06-30-1971         BSA:          2.333 m Patient Age:    55 years         BP:           155/110 mmHg Patient Gender: F                HR:           120 bpm. Exam Location:  Inpatient Procedure: 2D Echo, Cardiac Doppler, Color Doppler and Intracardiac             Opacification Agent Indications:    CHF  History:        Patient has no prior history of Echocardiogram examinations.                 Risk Factors:Hypertension.  Sonographer:    Helmut Muster Referring Phys: [id number] [place]  1. With Definity contrast, the very large papilarry muscles are visualized. The apex is akinetic. There is possible early thrombus formmation in the apex. . Left ventricular ejection fraction, by estimation, is <20%. The left ventricle has severely decreased function. The left ventricle demonstrates global hypokinesis. The left ventricular internal cavity size was moderately dilated. There is moderate concentric left ventricular hypertrophy. Left ventricular diastolic function could not be evaluated.  2. Right ventricular systolic function is moderately reduced. The right ventricular size is moderately enlarged.  3. Left atrial size was mildly dilated.  4. Right atrial size was mildly dilated.  5. The mitral valve is grossly normal. Severe mitral valve regurgitation.  6. Tricuspid valve regurgitation is mild to moderate.  7. The aortic valve is grossly normal. Aortic valve regurgitation is mild. No aortic stenosis is present. FINDINGS  Left Ventricle: With Definity contrast, the very large papilarry muscles are visualized. The apex is akinetic. There is possible early thrombus formmation in the apex. Left ventricular ejection fraction, by estimation, is <20%. The left ventricle has severely decreased function. The left ventricle demonstrates global hypokinesis. Definity contrast agent was given IV to delineate the left ventricular endocardial borders. The left ventricular internal cavity size was moderately dilated. There is moderate concentric left ventricular hypertrophy. Left ventricular diastolic function could not be evaluated due to atrial fibrillation. Left ventricular diastolic function could not be evaluated. Right Ventricle: The right ventricular size is  moderately enlarged. Right vetricular wall thickness was not well visualized. Right ventricular systolic function is moderately reduced. Left Atrium: Left atrial size was mildly dilated. Right Atrium: Right atrial size was mildly dilated. Pericardium: There is no evidence of pericardial effusion. Mitral Valve: The mitral valve is grossly normal. Severe mitral valve regurgitation. Tricuspid Valve: The tricuspid valve is grossly normal. Tricuspid valve regurgitation is mild to moderate.  Aortic Valve: The aortic valve is grossly normal. Aortic valve regurgitation is mild. No aortic stenosis is present. Pulmonic Valve: The pulmonic valve was grossly normal. Pulmonic valve regurgitation is trivial. Aorta: The aortic root and ascending aorta are structurally normal, with no evidence of dilitation. IAS/Shunts: The atrial septum is grossly normal.  LEFT VENTRICLE PLAX 2D LVIDd:         5.20 cm      Diastology LVIDs:         3.80 cm      LV e' medial:  7.83 cm/s LV PW:         1.50 cm      LV e' lateral: 8.70 cm/s LV IVS:        1.50 cm LVOT diam:     2.30 cm LVOT Area:     4.15 cm  LV Volumes (MOD) LV vol d, MOD A2C: 150.0 ml LV vol d, MOD A4C: 145.0 ml LV vol s, MOD A2C: 116.0 ml LV vol s, MOD A4C: 105.0 ml LV SV MOD A2C:     34.0 ml LV SV MOD A4C:     145.0 ml LV SV MOD BP:      37.8 ml RIGHT VENTRICLE            IVC RV S prime:     9.79 cm/s  IVC diam: 3.20 cm TAPSE (M-mode): 1.4 cm LEFT ATRIUM             Index        RIGHT ATRIUM           Index LA diam:        4.40 cm 1.89 cm/m   RA Area:     18.60 cm LA Vol (A2C):   78.0 ml 33.44 ml/m  RA Volume:   48.60 ml  20.83 ml/m LA Vol (A4C):   87.2 ml 37.38 ml/m LA Biplane Vol: 82.5 ml 35.37 ml/m   AORTA Ao Root diam: 2.90 cm Ao Asc diam:  3.60 cm MR Peak grad:    132.9 mmHg   TRICUSPID VALVE MR Mean grad:    76.5 mmHg    TR Peak grad:   43.6 mmHg MR Vmax:         576.50 cm/s  TR Vmax:        330.00 cm/s MR Vmean:        404.0 cm/s MR PISA:         4.02 cm     SHUNTS  MR PISA Eff ROA: 21 mm       Systemic Diam: 2.30 cm MR PISA Radius:  0.80 cm Mertie Moores MD Electronically signed by Mertie Moores MD Signature Date/Time: 05/04/2021/6:10:48 PM    Final    [place] EKG SITE RITE  Result Date: 05/05/2021 If Site Rite image not attached, placement could not be confirmed due to current cardiac rhythm.   Cardiac Studies   See echo above  Assessment   Principal Problem:   Atrial fibrillation with RVR (HCC) Active Problems:   Essential hypertension   Acute systolic (congestive) heart failure ([employer])   Plan   Ms. Stege has acute systolic congestive heart failure secondary to A. fib with RVR but also severe MR which I suspect is functional.  Her cardiac output is likely very low at this point and would recommend placement of a PICC line and obtain co-oximetry panel.  Most likely will need to start milrinone.  I will increase her Lasix to 40  mg IV 3 times daily.  We will postpone her TEE cardioversion until Friday until she is better compensated. Will formally consult advanced heart failure tomorrow for additional management recommendations.  Continue heparin and amiodarone.  I have recommended transferring her to [facility] for more intensive heart failure management.  CRITICAL CARE TIME: I have spent a total of 45 minutes with patient reviewing hospital notes, telemetry, EKGs, labs and examining the patient as well as establishing an assessment and plan that was discussed with the patient.  > 50% of time was spent in direct patient care. The patient is critically ill with multi-organ system failure and requires high complexity decision making for assessment and support, frequent evaluation and titration of therapies, application of advanced monitoring technologies and extensive interpretation of multiple databases.   Length of Stay:  LOS: 1 day   Pixie Casino, MD, [facility], [place] Director of the Advanced Lipid Disorders &   Cardiovascular Risk Reduction Clinic Diplomate of the American Board of Clinical Lipidology Attending Cardiologist  Direct Dial: [phone]  Fax: [phone]  Website:  [email or web address] 05/05/2021, 9:00 AM

## 2021-05-05 NOTE — Progress Notes (Signed)
Peripherally Inserted Central Catheter Placement  The IV Nurse has discussed with the patient and/or persons authorized to consent for the patient, the purpose of this procedure and the potential benefits and risks involved with this procedure.  The benefits include less needle sticks, lab draws from the catheter, and the patient may be discharged home with the catheter. Risks include, but not limited to, infection, bleeding, blood clot (thrombus formation), and puncture of an artery; nerve damage and irregular heartbeat and possibility to perform a PICC exchange if needed/ordered by physician.  Alternatives to this procedure were also discussed.  Bard Power PICC patient education guide, fact sheet on infection prevention and patient information card has been provided to patient /or left at bedside.    PICC Placement Documentation  PICC Double Lumen 25/00/37 PICC Left Basilic 45 cm 0 cm (Active)  Indication for Insertion or Continuance of Line Vasoactive infusions 05/05/21 1908  Exposed Catheter (cm) 0 cm 05/05/21 1908  Site Assessment Clean;Dry;Intact 05/05/21 1908  Lumen #1 Status Flushed;Saline locked;Blood return noted 05/05/21 1908  Lumen #2 Status Flushed;Saline locked;Blood return noted 05/05/21 1908  Dressing Type Transparent 05/05/21 1908  Dressing Status Clean;Dry;Intact 05/05/21 1908  Antimicrobial disc in place? Yes 05/05/21 1908  Safety Lock Not Applicable 04/88/89 1694  Line Care Connections checked and tightened 05/05/21 1908  Line Adjustment (NICU/IV Team Only) No 05/05/21 1908  Dressing Intervention New dressing 05/05/21 1908  Dressing Change Due 05/12/21 05/05/21 1908       Dallis Darden, Nicolette Bang 05/05/2021, 7:10 PM

## 2021-05-05 NOTE — Progress Notes (Signed)
ANTICOAGULATION CONSULT NOTE   Pharmacy Consult for Heparin  Indication: New onset atrial fibrillation  Allergies  Allergen Reactions   Lisinopril-Hydrochlorothiazide Anaphylaxis   Other Anaphylaxis   Lisinopril     Other reaction(s): Angioedema    Patient Measurements: Height: 5\' 3"  (160 cm) Weight: (!) 141.1 kg (311 lb) IBW/kg (Calculated) : 52.4   Vital Signs: BP: 133/99 (10/25 0630) Pulse Rate: 101 (10/25 0630)  Labs: Recent Labs    05/04/21 0007 05/04/21 0149 05/04/21 0404 05/04/21 0812 05/05/21 0550  HGB 11.2*  --  9.8*  --  9.9*  HCT 35.5*  --  31.7*  --  31.6*  PLT 397  --  360  --  343  HEPARINUNFRC  --   --   --  0.30 0.41  CREATININE 0.96  --  0.92  --   --   TROPONINIHS 48* 51*  --   --   --      Estimated Creatinine Clearance: 100.4 mL/min (by C-G formula based on SCr of 0.92 mg/dL).   Medical History: Past Medical History:  Diagnosis Date   Abnormal Pap smear of cervix    Allergy    Anemia    Fibroid    Hypertension    Leukocytosis, unspecified 10/18/2013   Obesity    Plantar fasciitis      Assessment: 50 y/o F with new onset atrial fibrillation. Starting heparin. Hgb 11.2, plts good. PTA meds reviewed.   Heparin level remains therapeutic on 1350 units/hr, possible DCCV today  Goal of Therapy:  Heparin level 0.3-0.7 units/ml Monitor platelets by anticoagulation protocol: Yes   Plan:  Continue heparin gtt at 1350 units/hr Daily heparin level, CBC, s/s bleeding F/u cards plan, DCCV and long term [facility] plan  Bertis Ruddy, PharmD Clinical Pharmacist ED Pharmacist Phone # [phone] 05/05/2021 7:39 AM

## 2021-05-05 NOTE — ED Notes (Signed)
RN called endoscopy to clarify TEE echo; charge RN Butch Penny says pt scheduled for 1300, needs to remain NPO. Updated MD and removed meal tray. Pt did not eat or drink any of tray.

## 2021-05-05 NOTE — Progress Notes (Signed)
   05/05/21 1606  Assess: MEWS Score  Temp 98.8 F (37.1 C)  BP (!) 148/133  Pulse Rate (!) 116  ECG Heart Rate (!) 115  Resp (!) 24  Level of Consciousness Alert  SpO2 96 %  O2 Device Nasal Cannula  O2 Flow Rate (L/min) 3 L/min  Assess: MEWS Score  MEWS Temp 0  MEWS Systolic 0  MEWS Pulse 2  MEWS RR 1  MEWS LOC 0  MEWS Score 3  MEWS Score Color Yellow  Assess: if the MEWS score is Yellow or Red  Were vital signs taken at a resting state? Yes  Focused Assessment No change from prior assessment  Early Detection of Sepsis Score *See Row Information* Low  MEWS guidelines implemented *See Row Information* No, previously yellow, continue vital signs every 4 hours  Treat  Pain Scale 0-10  Pain Score 0  Notify: Charge Nurse/RN  Name of Charge Nurse/RN Notified [place], RN  Date Charge Nurse/RN Notified 05/05/21  Time Charge Nurse/RN Notified 1620

## 2021-05-06 ENCOUNTER — Other Ambulatory Visit (HOSPITAL_COMMUNITY): Payer: Self-pay

## 2021-05-06 DIAGNOSIS — E876 Hypokalemia: Secondary | ICD-10-CM

## 2021-05-06 DIAGNOSIS — I10 Essential (primary) hypertension: Secondary | ICD-10-CM

## 2021-05-06 LAB — CBC
HCT: 32.4 % — ABNORMAL LOW (ref 36.0–46.0)
Hemoglobin: 10.4 g/dL — ABNORMAL LOW (ref 12.0–15.0)
MCH: 30.1 pg (ref 26.0–34.0)
MCHC: 32.1 g/dL (ref 30.0–36.0)
MCV: 93.9 fL (ref 80.0–100.0)
Platelets: 380 10*3/uL (ref 150–400)
RBC: 3.45 MIL/uL — ABNORMAL LOW (ref 3.87–5.11)
RDW: 15 % (ref 11.5–15.5)
WBC: 10.3 10*3/uL (ref 4.0–10.5)
nRBC: 0 % (ref 0.0–0.2)

## 2021-05-06 LAB — BASIC METABOLIC PANEL
Anion gap: 6 (ref 5–15)
Anion gap: 6 (ref 5–15)
BUN: 16 mg/dL (ref 6–20)
BUN: 16 mg/dL (ref 6–20)
CO2: 29 mmol/L (ref 22–32)
CO2: 30 mmol/L (ref 22–32)
Calcium: 9.7 mg/dL (ref 8.9–10.3)
Calcium: 9.8 mg/dL (ref 8.9–10.3)
Chloride: 102 mmol/L (ref 98–111)
Chloride: 103 mmol/L (ref 98–111)
Creatinine, Ser: 0.93 mg/dL (ref 0.44–1.00)
Creatinine, Ser: 1.07 mg/dL — ABNORMAL HIGH (ref 0.44–1.00)
GFR, Estimated: >60 mL/min (ref >60)
GFR, Estimated: >60 mL/min (ref >60)
Glucose, Bld: 147 mg/dL — ABNORMAL HIGH (ref 70–99)
Glucose, Bld: 120 mg/dL — ABNORMAL HIGH (ref 70–99)
Potassium: 2.8 mmol/L — ABNORMAL LOW (ref 3.5–5.1)
Potassium: 3.5 mmol/L (ref 3.5–5.1)
Sodium: 137 mmol/L (ref 135–145)
Sodium: 139 mmol/L (ref 135–145)

## 2021-05-06 LAB — MRSA NEXT GEN BY PCR, NASAL: MRSA by PCR Next Gen: NOT DETECTED

## 2021-05-06 LAB — COOXEMETRY PANEL
Carboxyhemoglobin: 1 % (ref 0.5–1.5)
Methemoglobin: 0.7 % (ref 0.0–1.5)
O2 Saturation: 66.8 %
Total hemoglobin: 14.2 g/dL (ref 12.0–16.0)

## 2021-05-06 LAB — HEPARIN LEVEL (UNFRACTIONATED)
Heparin Unfractionated: 0.19 IU/mL — ABNORMAL LOW (ref 0.30–0.70)
Heparin Unfractionated: 0.29 IU/mL — ABNORMAL LOW (ref 0.30–0.70)

## 2021-05-06 LAB — HEMOGLOBIN A1C
Hgb A1c MFr Bld: 5.8 % — ABNORMAL HIGH (ref 4.8–5.6)
Mean Plasma Glucose: 119.76 mg/dL

## 2021-05-06 LAB — MAGNESIUM: Magnesium: 1.8 mg/dL (ref 1.7–2.4)

## 2021-05-06 MED ORDER — SPIRONOLACTONE 12.5 MG HALF TABLET
12.5000 mg | ORAL_TABLET | Freq: Every day | ORAL | Status: DC
  Administered 2021-05-06: 12.5 mg via ORAL
  Filled 2021-05-06: qty 1

## 2021-05-06 MED ORDER — LOSARTAN POTASSIUM 50 MG PO TABS
50.0000 mg | ORAL_TABLET | Freq: Every day | ORAL | Status: DC
  Administered 2021-05-06 – 2021-05-11 (×6): 50 mg via ORAL
  Filled 2021-05-06 (×6): qty 1

## 2021-05-06 MED ORDER — HEPARIN BOLUS VIA INFUSION
1500.0000 [IU] | Freq: Once | INTRAVENOUS | Status: AC
  Administered 2021-05-06: 1500 [IU] via INTRAVENOUS
  Filled 2021-05-06: qty 1500

## 2021-05-06 MED ORDER — DIGOXIN 125 MCG PO TABS
0.1250 mg | ORAL_TABLET | Freq: Every day | ORAL | Status: DC
  Administered 2021-05-06 – 2021-05-14 (×9): 0.125 mg via ORAL
  Filled 2021-05-06 (×9): qty 1

## 2021-05-06 MED ORDER — POTASSIUM CHLORIDE CRYS ER 20 MEQ PO TBCR
40.0000 meq | EXTENDED_RELEASE_TABLET | ORAL | Status: AC
  Administered 2021-05-06 (×2): 40 meq via ORAL
  Filled 2021-05-06 (×2): qty 2

## 2021-05-06 NOTE — Consult Note (Addendum)
Advanced Heart Failure Team Consult Note   Primary Physician: Maximiano Coss, NP PCP-Cardiologist:  None  Reason for Consultation: Acute systolic HF  HPI:    TANVEER DOBBERSTEIN is seen today for evaluation of acute systolic HF at the request of Dr. Debara Pickett with Cardiology. Patient is a 50 y.o. female with history of HTN and obesity with BMI of 55.   She presented to ED on 10/23 with complaints of shortness of shortness of breath and cough for at least 3 weeks. Reports multiple family members had been sick and believes she had a "cold". Recently treated for suspected upper respiratory infection with antibiotics after a telemedicine visit on 10/19. She called EMS d/t worsening symptoms. Found to be in afib with rate 150s. She was initiated on heparin drip and IV amiodarone. BNP 670. Troponin flat, 48 > 51. Chest x-ray with evidence of CHF and small bilateral pleural effusions. Resp panel negative for flu and COVID-19. She was admitted under Cardiology service. Initiated on IV lasix.   Echo with LVEF < 20%, akinetic apex with possible early thrombus formation, moderate concentric LVH, RV moderately reduced, severe MR, mild to moderate TR.  Concern for low-output HF yesterday. PICC line placed. Co-ox 60%. She was started on 0.25 milrinone. Co-ox 67% this am. Diuresing well with TID IV lasix.  FH: No known hx of HF. Father had cerebral aneurysm and 2 prior CVAs.  SH: Currently in school for medical coding. Denies alcohol, tobacco or illicit drug use  Review of Systems: [y] = yes, [ ]  = no   General: Weight gain [Y]; Weight loss [ ] ; Anorexia [ ] ; Fatigue [ ] ; Fever [ ] ; Chills [ ] ; Weakness [ ]   Cardiac: Chest pain/pressure [ ] ; Resting SOB [ ] ; Exertional SOB [Y]; Orthopnea [Y]; Pedal Edema [Y]; Palpitations [ ] ; Syncope [ ] ; Presyncope [ ] ; Paroxysmal nocturnal dyspnea[ ]   Pulmonary: Cough [Y]; Wheezing[ ] ; Hemoptysis[ ] ; Sputum [ ] ; Snoring [ ]   GI: Vomiting[ ] ; Dysphagia[ ] ; Melena[ ] ;  Hematochezia [ ] ; Heartburn[ ] ; Abdominal pain [ ] ; Constipation [ ] ; Diarrhea [ ] ; BRBPR [ ]   GU: Hematuria[ ] ; Dysuria [ ] ; Nocturia[ ]   Vascular: Pain in legs with walking [ ] ; Pain in feet with lying flat [ ] ; Non-healing sores [ ] ; Stroke [ ] ; TIA [ ] ; Slurred speech [ ] ;  Neuro: Headaches[ ] ; Vertigo[ ] ; Seizures[ ] ; Paresthesias[ ] ;Blurred vision [ ] ; Diplopia [ ] ; Vision changes [ ]   Ortho/Skin: Arthritis [ ] ; Joint pain [ ] ; Muscle pain [ ] ; Joint swelling [ ] ; Back Pain [ ] ; Rash [ ]   Psych: Depression[ ] ; Anxiety[ ]   Heme: Bleeding problems [ ] ; Clotting disorders [ ] ; Anemia [ ]   Endocrine: Diabetes [ ] ; Thyroid dysfunction[ ]   Home Medications Prior to Admission medications   Medication Sig Start Date End Date Taking? Authorizing Provider  albuterol (VENTOLIN HFA) 108 (90 Base) MCG/ACT inhaler Inhale 2 puffs into the lungs every 6 (six) hours as needed for wheezing or shortness of breath. 09/03/20  Yes Maximiano Coss, NP  benzonatate (TESSALON PERLES) 100 MG capsule Take 1 capsule (100 mg total) by mouth 3 (three) times daily as needed for cough. 04/29/21  Yes Martin, Mary-Margaret, FNP  guaiFENesin (MUCINEX) 600 MG 12 hr tablet Take 600 mg by mouth 2 (two) times daily as needed for cough.   Yes [provider]  Probiotic CHEW Chew 2 capsules by mouth daily.   Yes [provider]  amLODipine (NORVASC) 10 MG tablet Take 1 tablet (10 mg total) by mouth daily. 10/17/19   Wendall Mola, NP  atenolol (TENORMIN) 50 MG tablet TAKE 1 TABLET BY MOUTH EVERY DAY Patient not taking: No sig reported 09/20/19   Forrest Moron, MD  azithromycin (ZITHROMAX Z-PAK) 250 MG tablet As directed Patient not taking: No sig reported 04/29/21   Hassell Done, Mary-Margaret, FNP  metoprolol succinate (TOPROL-XL) 50 MG 24 hr tablet Take 1 tablet (50 mg total) by mouth daily. Take with or immediately following a meal. 02/05/20   Maximiano Coss, NP  predniSONE (STERAPRED UNI-PAK 21 TAB) 10 MG  (21) TBPK tablet Use as directed Patient not taking: Reported on 05/04/2021 07/13/20   Sharion Balloon, FNP    Past Medical History: Past Medical History:  Diagnosis Date   Abnormal Pap smear of cervix    Allergy    Anemia    Fibroid    Hypertension    Leukocytosis, unspecified 10/18/2013   Obesity    Plantar fasciitis     Past Surgical History: Past Surgical History:  Procedure Laterality Date   COLPOSCOPY     TUBAL LIGATION  2001    Family History: Family History  Problem Relation Age of Onset   Hypertension Mother    Cervical cancer Mother    Hypertension Father    Stroke Father    Uterine cancer Maternal Grandmother    Diabetes Sister    Hypertension Brother    Hypertension Brother     Social History: Social History   Socioeconomic History   Marital status: Divorced    Spouse name: Not on file   Number of children: 4   Years of education: Not on file   Highest education level: Not on file  Occupational History   Occupation: CNA    Employer: Information systems manager   Tobacco Use   Smoking status: Never   Smokeless tobacco: Never  Vaping Use   Vaping Use: Never used  Substance and Sexual Activity   Alcohol use: No   Drug use: No   Sexual activity: Yes    Partners: Male    Birth control/protection: Surgical    Comment: BTL  Other Topics Concern   Not on file  Social History Narrative   [place] has 4 children. Three live at home with her.   Social Determinants of Health   Financial Resource Strain: Not on file  Food Insecurity: Not on file  Transportation Needs: Not on file  Physical Activity: Not on file  Stress: Not on file  Social Connections: Not on file    Allergies:  Allergies  Allergen Reactions   Lisinopril-Hydrochlorothiazide Anaphylaxis   Other Anaphylaxis   Lisinopril     Other reaction(s): Angioedema    Objective:    Vital Signs:   Temp:  [97.8 F (36.6 C)-98.8 F (37.1 C)] 98.1 F (36.7 C) (10/26 1028) Pulse Rate:  [90-116] 99  (10/26 1028) Resp:  [22-25] 25 (10/26 1028) BP: (104-151)/(53-133) 125/95 (10/26 1028) SpO2:  [92 %-100 %] 100 % (10/26 1028) Weight:  [141.7 kg] 141.7 kg (10/26 0516) Last BM Date: 05/06/21  Weight change: Filed Weights   05/03/21 2351 05/05/21 0950 05/06/21 0516  Weight: (!) 141.1 kg (!) 144.5 kg (!) 141.7 kg    Intake/Output:   Intake/Output Summary (Last 24 hours) at 05/06/2021 1306 Last data filed at 05/06/2021 1000 Gross per 24 hour  Intake 2442 ml  Output 3150 ml  Net -708 ml  Physical Exam    General:  Sitting up in chair. No distress.  HEENT: normal Neck: JVP 12 cm. Carotids 2+ bilat; no bruits. No lymphadenopathy or thyromegaly appreciated. Cor: PMI nondisplaced. Irregular rhythm, tachycardic. No rubs, gallops or murmurs. Lungs: clear Abdomen: Nontender, edematous and distended. Obese.  Extremities: no cyanosis, clubbing, rash, 2+ edema into thighs, LUE PICC  Neuro: alert & orientedx3, cranial nerves grossly intact. moves all 4 extremities w/o difficulty. Affect pleasant   Telemetry   AF 110s-120s (personally reviewed)  EKG    10/23: Atrial fibrillation with ventricular rate 152  Labs   Basic Metabolic Panel: Recent Labs  Lab 05/04/21 0007 05/04/21 0404 05/05/21 0944 05/06/21 0536  NA 134* 137 136 137  K 3.5 3.3* 3.5 2.8*  CL 105 106 101 102  CO2 20* 22 24 29   GLUCOSE 173* 142* 112* 147*  BUN 17 15 21* 16  CREATININE 0.96 0.92 1.06* 0.93  CALCIUM 10.1 9.4 10.3 9.7    Liver Function Tests: No results for input(s): AST, ALT, ALKPHOS, BILITOT, PROT, ALBUMIN in the last 168 hours. No results for input(s): LIPASE, AMYLASE in the last 168 hours. No results for input(s): AMMONIA in the last 168 hours.  CBC: Recent Labs  Lab 05/04/21 0007 05/04/21 0404 05/05/21 0550 05/06/21 0536  WBC 9.5 9.7 10.6* 10.3  NEUTROABS 6.4  --   --   --   HGB 11.2* 9.8* 9.9* 10.4*  HCT 35.5* 31.7* 31.6* 32.4*  MCV 96.2 97.5 96.0 93.9  PLT 397 360 343  380    Cardiac Enzymes: No results for input(s): CKTOTAL, CKMB, CKMBINDEX, TROPONINI in the last 168 hours.  BNP: BNP (last 3 results) Recent Labs    05/03/21 2349  BNP 670.0*    ProBNP (last 3 results) No results for input(s): PROBNP in the last 8760 hours.   CBG: No results for input(s): GLUCAP in the last 168 hours.  Coagulation Studies: Recent Labs    05/05/21 2301  LABPROT 15.6*  INR 1.2     Imaging   DG CHEST PORT 1 VIEW  Result Date: 05/05/2021 CLINICAL DATA:  Status post central line placement. EXAM: PORTABLE CHEST 1 VIEW COMPARISON:  Chest x-ray 05/03/2021. FINDINGS: There is a new left-sided central venous catheter with distal tip projecting over the mid SVC. There is no pneumothorax. Small pleural effusions and bibasilar opacities have increased. The heart is enlarged, unchanged. Osseous structures are unremarkable. IMPRESSION: 1. Left-sided central venous catheter tip projects over the mid SVC. 2. Increasing bilateral pleural effusions and bibasilar infiltrates. 3. Stable cardiomegaly. Electronically Signed   By: Ronney Asters M.D.   On: 05/05/2021 19:41     Medications:     Current Medications:  Chlorhexidine Gluconate Cloth  6 each Topical Daily   furosemide  40 mg Intravenous TID   sodium chloride flush  10-40 mL Intracatheter Q12H    Infusions:  amiodarone 30 mg/hr (05/05/21 2132)   heparin 1,550 Units/hr (05/06/21 0844)   milrinone 0.25 mcg/kg/min (05/06/21 0524)      Patient Profile   Raina is a 50 y.o. female with history of HTN and obesity admitted with atrial fibrillation with RVR and acute systolic HF.   Assessment/Plan  Acute systolic HF with low output/cardiomyopathy: -Suspect etiology likely tachy mediated in setting of AF with RVR. Myocarditis also a consideration given recent exposures and cold-like symptoms. However, troponin flat and just minimally elevated.   -Echo with LVEF < 20%, moderate LVH, moderately reduced RV, severe  MR -  R/LHC once diuresed -May need to consider cardiac MRI -Co-ox 67% on 0.25 milrinone. Continue inotrope while diuresing.  -HC03 20 on admit > 29. Lactic acid 1.5 > 1.7. -Diuresing well with IV lasix 40 mg TID. -3.3L yesterday. Weight down 6 lb overnight. Continue IV diuresis today.  -Orders placed for CVP monitoring. -Add spiro 12.5 mg daily -No BB d/t low output HF -Eventually add ARNI and SGLT2i. Check HgbA1c -UNNA boots  2. Atrial fibrillation with RVR: -New diagnosis this admit, suspect duration for several weeks based on onset of symptoms -TSH WNL -CHADS2-VASc score at least 3 (female, HTN, CHF), on heparin gtt -Continue amio gtt -Plan for TEE/DCCV once diuresed if does not chemically convert -Will need formal sleep study as outpatient  3. Mitral regurgitation: -Severe on echo -Suspect functional  4. HTN: -BP elevated. Adding spiro as above.   5. Morbid obesity: -BMI 55 -Discussed need for weight loss  6. Hypokalemia: -K 2.8 today. Replace.  -Check magnesium level -Adding spiro as above -BMET this afternoon  7. Possible LV thrombus: -AC with heparin    TOC and CR consults   Length of Stay: 2  FINCH, LINDSAY N, PA-C  05/06/2021, 1:06 PM  Advanced Heart Failure Team Pager [phone] (M-F; 7a - 5p)  Please contact [facility] for night-coverage after hours (4p -7a ) and weekends on [email or web address]   Patient seen and examined with the above-signed Advanced Practice Provider and/or Housestaff. I personally reviewed laboratory data, imaging studies and relevant notes. I independently examined the patient and formulated the important aspects of the plan. I have edited the note to reflect any of my changes or salient points. I have personally discussed the plan with the patient and/or family.  50 y/o woman with HTN, morbid obesity admitted with acute systolic HF. EF 20% with moderate RV dysfunction and severe MR in setting of AF of unknown duration. Now diuresing on  milrinone  General:  Lying in bed  No resp difficulty HEENT: normal Neck: supple. JVP up Carotids 2+ bilat; no bruits. No lymphadenopathy or thryomegaly appreciated. Cor: PMI nondisplaced. Irregular rate & rhythm. No rubs, gallops or murmurs. Lungs: clear Abdomen: obese soft, nontender, nondistended. No hepatosplenomegaly. No bruits or masses. Good bowel sounds. Extremities: no cyanosis, clubbing, rash, 2= edema Neuro: alert & orientedx3, cranial nerves grossly intact. moves all 4 extremities w/o difficulty. 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 Suspect underlying OSA.Continue diuresis with milrinone support. Plan R/L cath once fully diuresed followed by TEE and DC-CV. Continue amio and heparin.   Glori Bickers, MD  4:05 PM

## 2021-05-06 NOTE — Progress Notes (Signed)
   05/06/21 0723  Assess: MEWS Score  Temp 97.8 F (36.6 C)  BP (!) 151/84  Pulse Rate (!) 108  ECG Heart Rate (!) 109  Resp (!) 22  Level of Consciousness Alert  SpO2 100 %  O2 Device Nasal Cannula  O2 Flow Rate (L/min) 3 L/min  Assess: MEWS Score  MEWS Temp 0  MEWS Systolic 0  MEWS Pulse 1  MEWS RR 1  MEWS LOC 0  MEWS Score 2  MEWS Score Color Yellow  Assess: if the MEWS score is Yellow or Red  Were vital signs taken at a resting state? Yes  Focused Assessment No change from prior assessment  Early Detection of Sepsis Score *See Row Information* Low  MEWS guidelines implemented *See Row Information* No, previously yellow, continue vital signs every 4 hours  Treat  Pain Scale 0-10  Pain Score 0  Notify: Charge Nurse/RN  Name of Charge Nurse/RN Notified Mickel Baas, RN  Date Charge Nurse/RN Notified 05/06/21  Time Charge Nurse/RN Notified 0745

## 2021-05-06 NOTE — TOC Benefit Eligibility Note (Signed)
Patient Teacher, English as a foreign language completed.    The patient is currently admitted and upon discharge could be taking Eliquis 5 mg.  The current 30 day co-pay is, $20.00.   The patient is currently admitted and upon discharge could be taking Xarelto 20 mg.  The current 30 day co-pay is, $20.00.   The patient is currently admitted and upon discharge could be taking Entresto 24-26 mg.  The current 30 day co-pay is, $20.00.   The patient is currently admitted and upon discharge could be taking Jardiance 10 mg.  The current 30 day co-pay is, $20.00.   The patient is currently admitted and upon discharge could be taking Farxiga 10mg .  The current 30 day co-pay is, $15.00.  [place] 10 MG TABLET paid 5.00 toward plan copay  The patient is insured through [place], [place] Patient Advocate Specialist [place] Patient Advocate Team Direct Number: [phone]  Fax: [phone]

## 2021-05-06 NOTE — Plan of Care (Signed)

## 2021-05-06 NOTE — Progress Notes (Signed)
   05/06/21 1545  Assess: MEWS Score  Temp 98.6 F (37 C)  BP (!) 152/94  Pulse Rate (!) 128  ECG Heart Rate (!) 113  Resp (!) 29  Level of Consciousness Alert  SpO2 100 %  O2 Device Nasal Cannula  O2 Flow Rate (L/min) 3 L/min  Assess: MEWS Score  MEWS Temp 0  MEWS Systolic 0  MEWS Pulse 2  MEWS RR 2  MEWS LOC 0  MEWS Score 4  MEWS Score Color Red  Assess: if the MEWS score is Yellow or Red  Were vital signs taken at a resting state? Yes  Focused Assessment No change from prior assessment  Early Detection of Sepsis Score *See Row Information* Low  MEWS guidelines implemented *See Row Information* Yes  Treat  Pain Scale 0-10  Pain Score 0  Take Vital Signs  Increase Vital Sign Frequency  Red: Q 1hr X 4 then Q 4hr X 4, if remains red, continue Q 4hrs  Escalate  MEWS: Escalate Red: discuss with charge nurse/RN and provider, consider discussing with RRT  Notify: Charge Nurse/RN  Name of Charge Nurse/RN Notified Mickel Baas  Date Charge Nurse/RN Notified 05/06/21  Time Charge Nurse/RN Notified 1549

## 2021-05-06 NOTE — Progress Notes (Signed)
ANTICOAGULATION CONSULT NOTE   Pharmacy Consult for Heparin  Indication: New onset atrial fibrillation  Allergies  Allergen Reactions   Lisinopril-Hydrochlorothiazide Anaphylaxis   Other Anaphylaxis   Lisinopril     Other reaction(s): Angioedema    Patient Measurements: Height: 5\' 3"  (160 cm) Weight: (!) 141.7 kg (312 lb 6.3 oz) IBW/kg (Calculated) : 52.4 HEPARIN DW (KG): 89.2   Vital Signs: Temp: 98.6 F (37 C) (10/26 0500) Temp Source: Oral (10/26 0500) BP: 129/88 (10/26 0500) Pulse Rate: 90 (10/26 0500)  Labs: Recent Labs    05/04/21 0007 05/04/21 0149 05/04/21 0404 05/04/21 4128 05/05/21 0550 05/05/21 0944 05/05/21 2301 05/06/21 0536  HGB 11.2*  --  9.8*  --  9.9*  --   --  10.4*  HCT 35.5*  --  31.7*  --  31.6*  --   --  32.4*  PLT 397  --  360  --  343  --   --  380  LABPROT  --   --   --   --   --   --  15.6*  --   INR  --   --   --   --   --   --  1.2  --   HEPARINUNFRC  --   --   --  0.30 0.41  --   --  0.19*  CREATININE 0.96  --  0.92  --   --  1.06*  --  0.93  TROPONINIHS 48* 51*  --   --   --   --   --   --      Estimated Creatinine Clearance: 99.5 mL/min (by C-G formula based on SCr of 0.93 mg/dL).   Medical History: Past Medical History:  Diagnosis Date   Abnormal Pap smear of cervix    Allergy    Anemia    Fibroid    Hypertension    Leukocytosis, unspecified 10/18/2013   Obesity    Plantar fasciitis      Assessment: 50 y/o F with new onset atrial fibrillation on heparin. She is noted with acute HF on milrinone and for DCCV when more stable  -heparin level= 0.19 on 1350 units/hr -hg= 10.4  Goal of Therapy:  Heparin level 0.3-0.7 units/ml Monitor platelets by anticoagulation protocol: Yes   Plan:  -heparin 1500 unit bolus x1 then increase infusion to 1550 units/hr -Heparin level in 6 hours and daily wth CBC daily  Hildred Laser, PharmD Clinical Pharmacist **Pharmacist phone directory can now be found on [email or web address] ([email or web address]).   Listed under [place].

## 2021-05-06 NOTE — Plan of Care (Signed)

## 2021-05-06 NOTE — Progress Notes (Signed)
DAILY PROGRESS NOTE   Patient Name: Christine Oneal Date of Encounter: 05/06/2021 Cardiologist: None  Chief Complaint   Breathing better  Patient Profile   Christine Oneal is a 50 y.o. female with pmh sx for HTN and obesity who is being seen 05/04/2021 for the evaluation of palpitations and DOE.   Subjective   Christine Oneal did get her PICC line last night - COOX was 59.8 - started on milrinone 0.25 and now is 66.8 this morning. Potassium is low at 2.8. Good urine output with TID lasix - she was 2.4L negative. She is down 3 kg over the past few days. Remains in afib on amiodarone - rescheduled for TEE/DCCV on Friday if she is compensated.   Objective   Vitals:   05/06/21 0200 05/06/21 0500 05/06/21 0516 05/06/21 0723  BP: (!) 104/53 129/88  (!) 151/84  Pulse: 98 90  (!) 108  Resp: (!) 22 (!) 22  (!) 22  Temp: 98.6 F (37 C) 98.6 F (37 C)  97.8 F (36.6 C)  TempSrc: Oral Oral  Oral  SpO2: 98% 97%  100%  Weight:   (!) 141.7 kg   Height:        Intake/Output Summary (Last 24 hours) at 05/06/2021 0840 Last data filed at 05/06/2021 0513 Gross per 24 hour  Intake 1311.42 ml  Output 3750 ml  Net -2438.58 ml   Filed Weights   05/03/21 2351 05/05/21 0950 05/06/21 0516  Weight: (!) 141.1 kg (!) 144.5 kg (!) 141.7 kg    Physical Exam   General appearance: alert, no distress, and morbidly obese Neck: JVD - several cm above sternal notch, no carotid bruit, and thyroid not enlarged, symmetric, no tenderness/mass/nodules Lungs: diminished breath sounds bibasilar Heart: irregularly irregular rhythm, S3 present, and systolic murmur: holosystolic 3/6, blowing at apex Abdomen: soft, non-tender; bowel sounds normal; no masses,  no organomegaly and obese Extremities: edema 1+ bilateral LE Pulses: 2+ and symmetric Skin: Skin color, texture, turgor normal. No rashes or lesions Neurologic: Mental status: Alert, oriented, thought content appropriate Psych: Pleasant  Inpatient  Medications    Scheduled Meds:  Chlorhexidine Gluconate Cloth  6 each Topical Daily   furosemide  40 mg Intravenous TID   heparin  1,500 Units Intravenous Once   sodium chloride flush  10-40 mL Intracatheter Q12H    Continuous Infusions:  amiodarone 30 mg/hr (05/05/21 2132)   heparin 1,350 Units/hr (05/06/21 0526)   milrinone 0.25 mcg/kg/min (05/06/21 0524)    PRN Meds: acetaminophen, albuterol, ALPRAZolam, benzonatate, docusate sodium, guaiFENesin, ondansetron (ZOFRAN) IV, sodium chloride flush   Labs   Results for orders placed or performed during the hospital encounter of 05/03/21 (from the past 48 hour(s))  CBC     Status: Abnormal   Collection Time: 05/05/21  5:50 AM  Result Value Ref Range   WBC 10.6 (H) 4.0 - 10.5 K/uL   RBC 3.29 (L) 3.87 - 5.11 MIL/uL   Hemoglobin 9.9 (L) 12.0 - 15.0 g/dL   HCT 31.6 (L) 36.0 - 46.0 %   MCV 96.0 80.0 - 100.0 fL   MCH 30.1 26.0 - 34.0 pg   MCHC 31.3 30.0 - 36.0 g/dL   RDW 15.3 11.5 - 15.5 %   Platelets 343 150 - 400 K/uL   nRBC 0.0 0.0 - 0.2 %    Comment: Performed at [facility], [place] [address] [place], [place] [address]  Heparin level (unfractionated)     Status: None  Collection Time: 05/05/21  5:50 AM  Result Value Ref Range   Heparin Unfractionated 0.41 0.30 - 0.70 IU/mL    Comment: (NOTE) The clinical reportable range upper limit is being lowered to >1.10 to align with the FDA approved guidance for the current laboratory assay.  If heparin results are below expected values, and patient dosage has  been confirmed, suggest follow up testing of antithrombin III levels. Performed at [facility], [address] [place], [place] [address]   Basic metabolic panel     Status: Abnormal   Collection Time: 05/05/21  9:44 AM  Result Value Ref Range   Sodium 136 135 - 145 mmol/L   Potassium 3.5 3.5 - 5.1 mmol/L   Chloride 101 98 - 111 mmol/L   CO2 24 22 - 32 mmol/L   Glucose, Bld 112 (H) 70 - 99 mg/dL     Comment: Glucose reference range applies only to samples taken after fasting for at least 8 hours.   BUN 21 (H) 6 - 20 mg/dL   Creatinine, Ser 1.06 (H) 0.44 - 1.00 mg/dL   Calcium 10.3 8.9 - 10.3 mg/dL   GFR, Estimated >60 >60 mL/min    Comment: (NOTE) Calculated using the CKD-EPI Creatinine Equation (2021)    Anion gap 11 5 - 15    Comment: Performed at [facility], [address] [place], [place] [address]  MRSA Next Gen by PCR, Nasal     Status: None   Collection Time: 05/05/21  1:48 PM   Specimen: Nasal Mucosa; Nasal Swab  Result Value Ref Range   MRSA by PCR Next Gen NOT DETECTED NOT DETECTED    Comment: (NOTE) The GeneXpert MRSA Assay (FDA approved for NASAL specimens only), is one component of a comprehensive MRSA colonization surveillance program. It is not intended to diagnose MRSA infection nor to guide or monitor treatment for MRSA infections. Test performance is not FDA approved in patients less than 2 years old. Performed at [facility], [address] [place], [place] [address]   Protime-INR     Status: Abnormal   Collection Time: 05/05/21 11:01 PM  Result Value Ref Range   Prothrombin Time 15.6 (H) 11.4 - 15.2 seconds   INR 1.2 0.8 - 1.2    Comment: (NOTE) INR goal varies based on device and disease states. Performed at [facility], [address] [place], [place] [address]   Cooxemetry Panel (carboxy, met, total hgb, O2 sat)     Status: None   Collection Time: 05/05/21 11:01 PM  Result Value Ref Range   Total hemoglobin 14.0 12.0 - 16.0 g/dL   O2 Saturation 59.8 %   Carboxyhemoglobin 1.1 0.5 - 1.5 %   Methemoglobin 0.7 0.0 - 1.5 %    Comment: Performed at [facility], [address] [place], [place] [address]  CBC     Status: Abnormal   Collection Time: 05/06/21  5:36 AM  Result Value Ref Range   WBC 10.3 4.0 - 10.5 K/uL   RBC 3.45 (L) 3.87 - 5.11 MIL/uL   Hemoglobin 10.4 (L) 12.0 - 15.0 g/dL   HCT 32.4 (L) 36.0 - 46.0 %    MCV 93.9 80.0 - 100.0 fL   MCH 30.1 26.0 - 34.0 pg   MCHC 32.1 30.0 - 36.0 g/dL   RDW 15.0 11.5 - 15.5 %   Platelets 380 150 - 400 K/uL   nRBC 0.0 0.0 - 0.2 %      Comment: Performed at [facility], [place] [address] [place], [place] [address]  Heparin level (unfractionated)     Status: Abnormal   Collection Time: 05/06/21  5:36 AM  Result Value Ref Range   Heparin Unfractionated 0.19 (L) 0.30 - 0.70 IU/mL    Comment: (NOTE) The clinical reportable range upper limit is being lowered to >1.10 to align with the FDA approved guidance for the current laboratory assay.  If heparin results are below expected values, and patient dosage has  been confirmed, suggest follow up testing of antithrombin III levels. Performed at [facility], [place] [address], [place], [place] [address]   Basic metabolic panel     Status: Abnormal   Collection Time: 05/06/21  5:36 AM  Result Value Ref Range   Sodium 137 135 - 145 mmol/L   Potassium 2.8 (L) 3.5 - 5.1 mmol/L   Chloride 102 98 - 111 mmol/L   CO2 29 22 - 32 mmol/L   Glucose, Bld 147 (H) 70 - 99 mg/dL    Comment: Glucose reference range applies only to samples taken after fasting for at least 8 hours.   BUN 16 6 - 20 mg/dL   Creatinine, Ser 0.93 0.44 - 1.00 mg/dL   Calcium 9.7 8.9 - 10.3 mg/dL   GFR, Estimated >60 >60 mL/min    Comment: (NOTE) Calculated using the CKD-EPI Creatinine Equation (2021)    Anion gap 6 5 - 15    Comment: Performed at [facility] [address], [place], [place] [address]  .Cooxemetry Panel (carboxy, met, total hgb, O2 sat)     Status: None   Collection Time: 05/06/21  5:36 AM  Result Value Ref Range   Total hemoglobin 14.2 12.0 - 16.0 g/dL   O2 Saturation 66.8 %   Carboxyhemoglobin 1.0 0.5 - 1.5 %   Methemoglobin 0.7 0.0 - 1.5 %    Comment: Performed at [facility], [place] [address], [place], [place] [address]    ECG   N/A  Telemetry   AFib with RVR - Personally  Reviewed  Radiology    DG CHEST PORT 1 VIEW  Result Date: 05/05/2021 CLINICAL DATA:  Status post central line placement. EXAM: PORTABLE CHEST 1 VIEW COMPARISON:  Chest x-ray 05/03/2021. FINDINGS: There is a new left-sided central venous catheter with distal tip projecting over the mid SVC. There is no pneumothorax. Small pleural effusions and bibasilar opacities have increased. The heart is enlarged, unchanged. Osseous structures are unremarkable. IMPRESSION: 1. Left-sided central venous catheter tip projects over the mid SVC. 2. Increasing bilateral pleural effusions and bibasilar infiltrates. 3. Stable cardiomegaly. Electronically Signed   By: Ronney Asters M.D.   On: 05/05/2021 19:41   ECHOCARDIOGRAM COMPLETE  Result Date: 05/04/2021    ECHOCARDIOGRAM REPORT   Patient Name:   NATALIYA GRAIG Date of Exam: 05/04/2021 Medical Rec #:  [id number]        Height:       63.0 in Accession #:    [id number]       Weight:       311.0 lb Date of Birth:  1971-06-02         BSA:          2.333 m Patient Age:    53 years         BP:           155/110 mmHg Patient Gender: F  HR:           120 bpm. Exam Location:  Inpatient Procedure: 2D Echo, Cardiac Doppler, Color Doppler and Intracardiac            Opacification Agent Indications:    CHF  History:        Patient has no prior history of Echocardiogram examinations.                 Risk Factors:Hypertension.  Sonographer:    Francis Mulunda Referring Phys: [id number] MUHAMMAD S KHAN IMPRESSIONS  1. With Definity contrast, the very large papilarry muscles are visualized. The apex is akinetic. There is possible early thrombus formmation in the apex. . Left ventricular ejection fraction, by estimation, is <20%. The left ventricle has severely decreased function. The left ventricle demonstrates global hypokinesis. The left ventricular internal cavity size was moderately dilated. There is moderate concentric left ventricular hypertrophy. Left ventricular  diastolic function could not be evaluated.  2. Right ventricular systolic function is moderately reduced. The right ventricular size is moderately enlarged.  3. Left atrial size was mildly dilated.  4. Right atrial size was mildly dilated.  5. The mitral valve is grossly normal. Severe mitral valve regurgitation.  6. Tricuspid valve regurgitation is mild to moderate.  7. The aortic valve is grossly normal. Aortic valve regurgitation is mild. No aortic stenosis is present. FINDINGS  Left Ventricle: With Definity contrast, the very large papilarry muscles are visualized. The apex is akinetic. There is possible early thrombus formmation in the apex. Left ventricular ejection fraction, by estimation, is <20%. The left ventricle has severely decreased function. The left ventricle demonstrates global hypokinesis. Definity contrast agent was given IV to delineate the left ventricular endocardial borders. The left ventricular internal cavity size was moderately dilated. There is moderate concentric left ventricular hypertrophy. Left ventricular diastolic function could not be evaluated due to atrial fibrillation. Left ventricular diastolic function could not be evaluated. Right Ventricle: The right ventricular size is moderately enlarged. Right vetricular wall thickness was not well visualized. Right ventricular systolic function is moderately reduced. Left Atrium: Left atrial size was mildly dilated. Right Atrium: Right atrial size was mildly dilated. Pericardium: There is no evidence of pericardial effusion. Mitral Valve: The mitral valve is grossly normal. Severe mitral valve regurgitation. Tricuspid Valve: The tricuspid valve is grossly normal. Tricuspid valve regurgitation is mild to moderate. Aortic Valve: The aortic valve is grossly normal. Aortic valve regurgitation is mild. No aortic stenosis is present. Pulmonic Valve: The pulmonic valve was grossly normal. Pulmonic valve regurgitation is trivial. Aorta: The  aortic root and ascending aorta are structurally normal, with no evidence of dilitation. IAS/Shunts: The atrial septum is grossly normal.  LEFT VENTRICLE PLAX 2D LVIDd:         5.20 cm      Diastology LVIDs:         3.80 cm      LV e' medial:  7.83 cm/s LV PW:         1.50 cm      LV e' lateral: 8.70 cm/s LV IVS:        1.50 cm LVOT diam:     2.30 cm LVOT Area:     4.15 cm  LV Volumes (MOD) LV vol d, MOD A2C: 150.0 ml LV vol d, MOD A4C: 145.0 ml LV vol s, MOD A2C: 116.0 ml LV vol s, MOD A4C: 105.0 ml LV SV MOD A2C:     34.0 ml LV SV MOD   A4C:     145.0 ml LV SV MOD BP:      37.8 ml RIGHT VENTRICLE            IVC RV S prime:     9.79 cm/s  IVC diam: 3.20 cm TAPSE (M-mode): 1.4 cm LEFT ATRIUM             Index        RIGHT ATRIUM           Index LA diam:        4.40 cm 1.89 cm/m   RA Area:     18.60 cm LA Vol (A2C):   78.0 ml 33.44 ml/m  RA Volume:   48.60 ml  20.83 ml/m LA Vol (A4C):   87.2 ml 37.38 ml/m LA Biplane Vol: 82.5 ml 35.37 ml/m   AORTA Ao Root diam: 2.90 cm Ao Asc diam:  3.60 cm MR Peak grad:    132.9 mmHg   TRICUSPID VALVE MR Mean grad:    76.5 mmHg    TR Peak grad:   43.6 mmHg MR Vmax:         576.50 cm/s  TR Vmax:        330.00 cm/s MR Vmean:        404.0 cm/s MR PISA:         4.02 cm     SHUNTS MR PISA Eff ROA: 21 mm       Systemic Diam: 2.30 cm MR PISA Radius:  0.80 cm Mertie Moores MD Electronically signed by Mertie Moores MD Signature Date/Time: 05/04/2021/6:10:48 PM    Final    [place] EKG SITE RITE  Result Date: 05/05/2021 If Site Rite image not attached, placement could not be confirmed due to current cardiac rhythm.   Cardiac Studies   See echo above  Assessment   Principal Problem:   Atrial fibrillation with RVR (HCC) Active Problems:   Essential hypertension   Acute systolic (congestive) heart failure ([place])   Plan   Ms. Beyersdorf has responded well to diuresis and milrinone - co-ox improved today to 66.8. Good urine output overnight. Remains in afib on amiodarone. Will  replete K+ this am (2.8) - magnesium pending. Will ask heart failure to formally evaluate today. Still pending TEE/DCCV on Friday.  CRITICAL CARE TIME: I have spent a total of 45 minutes with patient reviewing hospital notes, telemetry, EKGs, labs and examining the patient as well as establishing an assessment and plan that was discussed with the patient.  > 50% of time was spent in direct patient care. The patient is critically ill with multi-organ system failure and requires high complexity decision making for assessment and support, frequent evaluation and titration of therapies, application of advanced monitoring technologies and extensive interpretation of multiple databases.   Length of Stay:  LOS: 2 days   Pixie Casino, MD, [facility], [place] Director of the [facility] Lip[facility] &  Cardiovascular Risk [facility] Diplomate of the American Board of Clinical Lipidology Attending Cardiologist  Direct Dial: [phone]  Fax: [phone]  Website:  [email or web address] 05/06/2021, 8:40 AM

## 2021-05-06 NOTE — Plan of Care (Signed)
  Problem: Education: Goal: Knowledge of General Education information will improve Description: Including pain rating scale, medication(s)/side effects and non-pharmacologic comfort measures Outcome: Progressing   Problem: Health Behavior/Discharge Planning: Goal: Ability to manage health-related needs will improve Outcome: Progressing   Problem: Activity: Goal: Risk for activity intolerance will decrease Outcome: Progressing   Problem: Nutrition: Goal: Adequate nutrition will be maintained Outcome: Progressing   Problem: Coping: Goal: Level of anxiety will decrease Outcome: Progressing   Problem: Pain Managment: Goal: General experience of comfort will improve Outcome: Progressing   Problem: Elimination: Goal: Will not experience complications related to urinary retention Outcome: Not Applicable

## 2021-05-06 NOTE — Progress Notes (Signed)
Mobility Specialist Progress Note:   05/06/21 1010  Mobility  Activity Ambulated in hall  Level of Assistance Standby assist, set-up cues, supervision of patient - no hands on  Assistive Device None  Distance Ambulated (ft) 225 ft  Mobility Ambulated with assistance in hallway  Mobility Response Tolerated well  Mobility performed by Mobility specialist  Bed Position Chair  $Mobility charge 1 Mobility   Pre- Mobility:  115 HR;  94% SpO2 During Mobility: 130 HR;  90% SpO2 Post Mobility:   117 HR; 93% SpO2  Pt received in chair willing to participate in mobility. No complaints of pain. Pt returned to chair with call bell in reach and all needs met.   [facility] Health and safety inspector Phone [phone]

## 2021-05-06 NOTE — Progress Notes (Signed)
ANTICOAGULATION CONSULT NOTE   Pharmacy Consult for Heparin  Indication: New onset atrial fibrillation  Allergies  Allergen Reactions   Lisinopril-Hydrochlorothiazide Anaphylaxis   Other Anaphylaxis   Lisinopril     Other reaction(s): Angioedema    Patient Measurements: Height: 5\' 3"  (160 cm) Weight: (!) 141.7 kg (312 lb 6.3 oz) IBW/kg (Calculated) : 52.4 HEPARIN DW (KG): 89.2   Vital Signs: Temp: 98.6 F (37 C) (10/26 1545) Temp Source: Oral (10/26 1545) BP: 152/94 (10/26 1545) Pulse Rate: 128 (10/26 1545)  Labs: Recent Labs    05/04/21 0007 05/04/21 0149 05/04/21 0404 05/04/21 7341 05/05/21 0550 05/05/21 0944 05/05/21 2301 05/06/21 0536 05/06/21 1438  HGB 11.2*  --  9.8*  --  9.9*  --   --  10.4*  --   HCT 35.5*  --  31.7*  --  31.6*  --   --  32.4*  --   PLT 397  --  360  --  343  --   --  380  --   LABPROT  --   --   --   --   --   --  15.6*  --   --   INR  --   --   --   --   --   --  1.2  --   --   HEPARINUNFRC  --   --   --    < > 0.41  --   --  0.19* 0.29*  CREATININE 0.96  --  0.92  --   --  1.06*  --  0.93  --   TROPONINIHS 48* 51*  --   --   --   --   --   --   --    < > = values in this interval not displayed.     Estimated Creatinine Clearance: 99.5 mL/min (by C-G formula based on SCr of 0.93 mg/dL).   Medical History: Past Medical History:  Diagnosis Date   Abnormal Pap smear of cervix    Allergy    Anemia    Fibroid    Hypertension    Leukocytosis, unspecified 10/18/2013   Obesity    Plantar fasciitis      Assessment: 50 y/o F with new onset atrial fibrillation on heparin. She is noted with acute HF on milrinone and for DCCV when more stable  -heparin level= 0.29 on 1550 units/hr -hg= 10.4  Goal of Therapy:  Heparin level 0.3-0.7 units/ml Monitor platelets by anticoagulation protocol: Yes   Plan:  -Increase heparin to 1650 units/hr  -Heparin level in 6 hours and daily wth CBC daily  Albertina Parr, PharmD., BCPS,  BCCCP Clinical Pharmacist Please refer to [facility] for unit-specific pharmacist

## 2021-05-07 ENCOUNTER — Encounter (HOSPITAL_COMMUNITY): Payer: Self-pay | Admitting: Internal Medicine

## 2021-05-07 LAB — BASIC METABOLIC PANEL
Anion gap: 5 (ref 5–15)
Anion gap: 6 (ref 5–15)
BUN: 13 mg/dL (ref 6–20)
BUN: 13 mg/dL (ref 6–20)
CO2: 31 mmol/L (ref 22–32)
CO2: 28 mmol/L (ref 22–32)
Calcium: 9.6 mg/dL (ref 8.9–10.3)
Calcium: 10 mg/dL (ref 8.9–10.3)
Chloride: 99 mmol/L (ref 98–111)
Chloride: 98 mmol/L (ref 98–111)
Creatinine, Ser: 1.09 mg/dL — ABNORMAL HIGH (ref 0.44–1.00)
Creatinine, Ser: 0.97 mg/dL (ref 0.44–1.00)
GFR, Estimated: >60 mL/min (ref >60)
GFR, Estimated: >60 mL/min (ref >60)
Glucose, Bld: 171 mg/dL — ABNORMAL HIGH (ref 70–99)
Glucose, Bld: 263 mg/dL — ABNORMAL HIGH (ref 70–99)
Potassium: 2.9 mmol/L — ABNORMAL LOW (ref 3.5–5.1)
Potassium: 4 mmol/L (ref 3.5–5.1)
Sodium: 135 mmol/L (ref 135–145)
Sodium: 132 mmol/L — ABNORMAL LOW (ref 135–145)

## 2021-05-07 LAB — COOXEMETRY PANEL
Carboxyhemoglobin: 1.4 % (ref 0.5–1.5)
Methemoglobin: 1 % (ref 0.0–1.5)
O2 Saturation: 71.3 %
Total hemoglobin: 10.1 g/dL — ABNORMAL LOW (ref 12.0–16.0)

## 2021-05-07 LAB — CBC
HCT: 30.6 % — ABNORMAL LOW (ref 36.0–46.0)
Hemoglobin: 9.8 g/dL — ABNORMAL LOW (ref 12.0–15.0)
MCH: 30.2 pg (ref 26.0–34.0)
MCHC: 32 g/dL (ref 30.0–36.0)
MCV: 94.4 fL (ref 80.0–100.0)
Platelets: 345 10*3/uL (ref 150–400)
RBC: 3.24 MIL/uL — ABNORMAL LOW (ref 3.87–5.11)
RDW: 15.1 % (ref 11.5–15.5)
WBC: 10 10*3/uL (ref 4.0–10.5)
nRBC: 0 % (ref 0.0–0.2)

## 2021-05-07 LAB — HEPARIN LEVEL (UNFRACTIONATED)
Heparin Unfractionated: 0.64 IU/mL (ref 0.30–0.70)
Heparin Unfractionated: 0.69 IU/mL (ref 0.30–0.70)

## 2021-05-07 MED ORDER — SPIRONOLACTONE 25 MG PO TABS
25.0000 mg | ORAL_TABLET | Freq: Every day | ORAL | Status: DC
  Administered 2021-05-07 – 2021-05-14 (×8): 25 mg via ORAL
  Filled 2021-05-07 (×8): qty 1

## 2021-05-07 MED ORDER — POTASSIUM CHLORIDE CRYS ER 20 MEQ PO TBCR
60.0000 meq | EXTENDED_RELEASE_TABLET | Freq: Two times a day (BID) | ORAL | Status: AC
  Administered 2021-05-07 (×2): 60 meq via ORAL
  Filled 2021-05-07 (×2): qty 3

## 2021-05-07 MED ORDER — POTASSIUM CHLORIDE CRYS ER 20 MEQ PO TBCR
20.0000 meq | EXTENDED_RELEASE_TABLET | Freq: Once | ORAL | Status: AC
  Administered 2021-05-07: 20 meq via ORAL
  Filled 2021-05-07: qty 1

## 2021-05-07 MED ORDER — MAGNESIUM SULFATE 2 GM/50ML IV SOLN
2.0000 g | Freq: Once | INTRAVENOUS | Status: AC
  Administered 2021-05-07: 2 g via INTRAVENOUS
  Filled 2021-05-07: qty 50

## 2021-05-07 NOTE — Plan of Care (Signed)

## 2021-05-07 NOTE — Progress Notes (Signed)
ANTICOAGULATION CONSULT NOTE   Pharmacy Consult for Heparin  Indication: New onset atrial fibrillation  Allergies  Allergen Reactions   Lisinopril-Hydrochlorothiazide Anaphylaxis   Other Anaphylaxis   Lisinopril     Other reaction(s): Angioedema    Patient Measurements: Height: 5\' 3"  (160 cm) Weight: (!) 140.6 kg (309 lb 15.5 oz) IBW/kg (Calculated) : 52.4 HEPARIN DW (KG): 89.2   Vital Signs: Temp: 98 F (36.7 C) (10/27 1230) Temp Source: Oral (10/27 1230) BP: 157/101 (10/27 1230) Pulse Rate: 98 (10/27 0700)  Labs: Recent Labs    05/05/21 0550 05/05/21 0944 05/05/21 2301 05/06/21 0536 05/06/21 1438 05/06/21 2310 05/07/21 0455  HGB 9.9*  --   --  10.4*  --   --  9.8*  HCT 31.6*  --   --  32.4*  --   --  30.6*  PLT 343  --   --  380  --   --  345  LABPROT  --   --  15.6*  --   --   --   --   INR  --   --  1.2  --   --   --   --   HEPARINUNFRC 0.41  --   --  0.19* 0.29* 0.64 0.69  CREATININE  --    < >  --  0.93 1.07*  --  1.09*   < > = values in this interval not displayed.     Estimated Creatinine Clearance: 84.5 mL/min (A) (by C-G formula based on SCr of 1.09 mg/dL (H)).   Medical History: Past Medical History:  Diagnosis Date   Abnormal Pap smear of cervix    Allergy    Anemia    Fibroid    Hypertension    Leukocytosis, unspecified 10/18/2013   Obesity    Plantar fasciitis      Assessment: 50 y/o F with new onset atrial fibrillation on heparin. She is noted with acute HF on milrinone and for DCCV when more stable.   Heparin level came back therapeutic at 0.69 (on higher end of goal range), on 1650 units/hr. Hgb 9.8, plt 345. No s/sx of bleeding or infusion issues.   Goal of Therapy:  Heparin level 0.3-0.7 units/ml Monitor platelets by anticoagulation protocol: Yes   Plan:  -Reduce heparin infusion to 1600 units/hr to keep in goal range  -Monitor daily CBC, HL, and for s/sx of bleeding   Antonietta Jewel, PharmD, [place] Pharmacist   Phone: [phone] 05/07/2021 3:02 PM  Please check AMION for all [place] phone numbers After 10:00 PM, call [place] [phone]

## 2021-05-07 NOTE — Plan of Care (Signed)

## 2021-05-07 NOTE — Progress Notes (Signed)
ANTICOAGULATION CONSULT NOTE - Follow Up Consult  Pharmacy Consult for heparin Indication: atrial fibrillation  Labs: Recent Labs     0000 05/04/21 0149 05/04/21 0404 05/04/21 9381 05/05/21 0550 05/05/21 0944 05/05/21 2301 05/06/21 0536 05/06/21 1438 05/06/21 2310  HGB   < >  --  9.8*  --  9.9*  --   --  10.4*  --   --   HCT  --   --  31.7*  --  31.6*  --   --  32.4*  --   --   PLT  --   --  360  --  343  --   --  380  --   --   LABPROT  --   --   --   --   --   --  15.6*  --   --   --   INR  --   --   --   --   --   --  1.2  --   --   --   HEPARINUNFRC  --   --   --    < > 0.41  --   --  0.19* 0.29* 0.64  CREATININE   < >  --  0.92  --   --  1.06*  --  0.93 1.07*  --   TROPONINIHS  --  51*  --   --   --   --   --   --   --   --    < > = values in this interval not displayed.    Assessment/Plan:  50yo female therapeutic on heparin after rate changes. Will continue infusion at current rate of 1650 units/hr and confirm stable with am labs.   Wynona Neat, PharmD, BCPS  05/07/2021,12:10 AM

## 2021-05-07 NOTE — Progress Notes (Signed)
CARDIAC REHAB PHASE I   PRE:  Rate/Rhythm: 107 afib    BP: sitting 149/103    SaO2: 100 3L, 95 RA  MODE:  Ambulation: 240 ft   POST:  Rate/Rhythm: 140s afib    BP: sitting 164/101     SaO2: 95 RA  Pt stood and ambulated independent, just slow pace. SOB with HR increase, rest x1. SaO2 stable on RA. To recliner. Gave HF booklet, Off the Beat, low sodium diet and heart healthy diet. Encouraged reading and we will discuss more later. [place], ACSM 05/07/2021 2:18 PM

## 2021-05-07 NOTE — Progress Notes (Addendum)
Advanced Heart Failure Rounding Note  PCP-Cardiologist: None   Subjective:    Co-ox 71% on 0.125 milrinone CVP 16 sitting up. On lasix 40 IV TID.  Scr stable at 1.09. K 2.9. -2L yesterday. Weight down 3 lb overnight, 9 lb since admit.  AF with rates 90s-low 100s on 60 mg amiodarone IV  Able to sleep better last night. Less dyspnea. Edema improved.   Objective:   Weight Range: (!) 140.6 kg Body mass index is 54.91 kg/m.   Vital Signs:   Temp:  [98 F (36.7 C)-98.9 F (37.2 C)] 98 F (36.7 C) (10/27 0700) Pulse Rate:  [95-128] 98 (10/27 0700) Resp:  [16-29] 16 (10/27 0700) BP: (100-158)/(63-95) 136/89 (10/27 0700) SpO2:  [97 %-100 %] 100 % (10/27 0700) Weight:  [140.6 kg] 140.6 kg (10/27 0515) Last BM Date: 05/06/21  Weight change: Filed Weights   05/05/21 0950 05/06/21 0516 05/07/21 0515  Weight: (!) 144.5 kg (!) 141.7 kg (!) 140.6 kg    Intake/Output:   Intake/Output Summary (Last 24 hours) at 05/07/2021 0732 Last data filed at 05/07/2021 0516 Gross per 24 hour  Intake 1829.6 ml  Output 3775 ml  Net -1945.4 ml      Physical Exam  CVP 16 General:  Sitting up in bed. No distress HEENT: Normal Neck: Supple. + JVD. Carotids 2+ bilat; no bruits. No lymphadenopathy or thyromegaly appreciated. Cor: PMI nondisplaced. Irregular rhythm. No rubs, gallops, 2-3/6 MR murmur Lungs: Diminished in bases Abdomen: Soft, nontender, nondistended. No hepatosplenomegaly. No bruits or masses. Good bowel sounds. Extremities: No cyanosis, clubbing, rash, 1-2+ edema to knees Neuro: Alert & orientedx3, cranial nerves grossly intact. moves all 4 extremities w/o difficulty. Affect pleasant   Telemetry   AF 90s-low 100s (personally reviewed)   Labs    CBC Recent Labs    05/06/21 0536 05/07/21 0455  WBC 10.3 10.0  HGB 10.4* 9.8*  HCT 32.4* 30.6*  MCV 93.9 94.4  PLT 380 010   Basic Metabolic Panel Recent Labs    05/06/21 1438 05/07/21 0455  NA 139 135  K 3.5  2.9*  CL 103 99  CO2 30 31  GLUCOSE 120* 171*  BUN 16 13  CREATININE 1.07* 1.09*  CALCIUM 9.8 9.6  MG 1.8  --    Liver Function Tests No results for input(s): AST, ALT, ALKPHOS, BILITOT, PROT, ALBUMIN in the last 72 hours. No results for input(s): LIPASE, AMYLASE in the last 72 hours. Cardiac Enzymes No results for input(s): CKTOTAL, CKMB, CKMBINDEX, TROPONINI in the last 72 hours.  BNP: BNP (last 3 results) Recent Labs    05/03/21 2349  BNP 670.0*    ProBNP (last 3 results) No results for input(s): PROBNP in the last 8760 hours.   D-Dimer No results for input(s): DDIMER in the last 72 hours. Hemoglobin A1C Recent Labs    05/06/21 1438  HGBA1C 5.8*   Fasting Lipid Panel No results for input(s): CHOL, HDL, LDLCALC, TRIG, CHOLHDL, LDLDIRECT in the last 72 hours. Thyroid Function Tests No results for input(s): TSH, T4TOTAL, T3FREE, THYROIDAB in the last 72 hours.  Invalid input(s): FREET3  Other results:   Imaging    No results found.   Medications:     Scheduled Medications:  Chlorhexidine Gluconate Cloth  6 each Topical Daily   digoxin  0.125 mg Oral Daily   furosemide  40 mg Intravenous TID   losartan  50 mg Oral Daily   sodium chloride flush  10-40 mL Intracatheter Q12H  spironolactone  12.5 mg Oral Daily    Infusions:  amiodarone 60 mg/hr (05/07/21 5366)   heparin 1,650 Units/hr (05/06/21 2105)   milrinone 0.125 mcg/kg/min (05/07/21 0623)    PRN Medications: acetaminophen, albuterol, ALPRAZolam, benzonatate, docusate sodium, guaiFENesin, ondansetron (ZOFRAN) IV, sodium chloride flush    Patient Profile   Christine Oneal is a 50 y.o. female with history of HTN and obesity admitted with atrial fibrillation with RVR and acute systolic HF.   Assessment/Plan  Acute systolic HF with low output/cardiomyopathy: -Suspect etiology likely tachy mediated in setting of AF with RVR. Uncontrolled HTN may also be playing a role. Myocarditis also a  consideration. However, troponin flat and just minimally elevated.   -Echo with LVEF < 20%, moderate LVH, moderately reduced RV, severe MR -R/LHC once diuresed -May need to consider cardiac MRI -Co-ox 71% on 0.125 milrinone. Continue inotrope while diuresing.  -HC03 20 on admit > 29. Lactic acid 1.5 > 1.7. -CVP 16 sitting up in bed. Diuresing well with IV lasix 40 mg TID. Continue today.  -2L yesterday. Weight down another 3 lb overnight. Continue IV diuresis today.  -Increase spiro to 25 mg daily. -Continue losartan 50 mg daily (no ARNI d/t hx angioedema with ACEi) -Continue digoxin 0.125 mg daily -A1c 5.8. Plan to add SGLT2i -No BB d/t low output HF -UNNA boots ordered   2. Atrial fibrillation with RVR: -New diagnosis this admit, suspect duration for several weeks based on onset of symptoms -TSH WNL -CHADS2-VASc score at least 3 (female, HTN, CHF), on heparin gtt. Eventually switch to eliquis -Continue amio gtt -Plan for TEE/DCCV once diuresed if does not chemically convert -Will need formal sleep study as outpatient   3. Mitral regurgitation: -Severe on echo -Suspect functional   4. HTN: -BP improving    5. Morbid obesity: -BMI 55 -Discussed need for weight loss   6. Hypokalemia: -K 2.9 today. Agressively replace.  -Mag 1.8. Replete. -Increase spiro to 25 mg daily -BMET this afternoon   7. Possible LV thrombus: -AC with heparin   Length of Stay: 3  [facility], [place], PA-C  05/07/2021, 7:32 AM  Advanced Heart Failure Team Pager [phone] (M-F; 7a - 5p)  Please contact [facility] for night-coverage after hours (5p -7a ) and weekends on [email or web address]  Patient seen and examined with the above-signed Advanced Practice Provider and/or Housestaff. I personally reviewed laboratory data, imaging studies and relevant notes. I independently examined the patient and formulated the important aspects of the plan. I have edited the note to reflect any of my changes or salient  points. I have personally discussed the plan with the patient and/or family.  On milrinone. Diuresing fairly well. Co-ox 71% . CVP 16. Breathing better. Remains in AF. Rate controlled on IV amio   General:  Lying in bed No resp difficulty HEENT: normal Neck: supple. JVP to ear Carotids 2+ bilat; no bruits. No lymphadenopathy or thryomegaly appreciated. Cor: PMI nondisplaced. Irregular rate & rhythm. No rubs, gallops or murmurs. Lungs: clear Abdomen: obese soft, nontender, nondistended. No hepatosplenomegaly. No bruits or masses. Good bowel sounds. Extremities: no cyanosis, clubbing, rash, 2+ edema Neuro: alert & orientedx3, cranial nerves grossly intact. moves all 4 extremities w/o difficulty. Affect pleasant  Making progress but still volume overloaded. Co-ox stable on milrinone. Continue IV lasix. Give metolazone 2.5 x 1 once K supped. R/L cath and TEE/DCCV early next week.   Glori Bickers, MD  9:42 AM

## 2021-05-07 NOTE — Progress Notes (Signed)
Orthopedic Tech Progress Note Patient Details:  Christine Oneal [birth year] [id number] Applied unna boots to BLE. Ortho Devices Type of Ortho Device: Haematologist Ortho Device/Splint Location: BLE Ortho Device/Splint Interventions: Ordered, Application   Post Interventions Patient Tolerated: Well Instructions Provided: Care of device  Petra Kuba 05/07/2021, 4:34 PM

## 2021-05-08 ENCOUNTER — Encounter (HOSPITAL_COMMUNITY)
Admission: EM | Disposition: A | Discharge status: Home Health | Payer: Self-pay | Source: Home / Self Care | Attending: Internal Medicine

## 2021-05-08 LAB — CBC
HCT: 33.1 % — ABNORMAL LOW (ref 36.0–46.0)
Hemoglobin: 10.5 g/dL — ABNORMAL LOW (ref 12.0–15.0)
MCH: 30 pg (ref 26.0–34.0)
MCHC: 31.7 g/dL (ref 30.0–36.0)
MCV: 94.6 fL (ref 80.0–100.0)
Platelets: 367 10*3/uL (ref 150–400)
RBC: 3.5 MIL/uL — ABNORMAL LOW (ref 3.87–5.11)
RDW: 15.2 % (ref 11.5–15.5)
WBC: 8.9 10*3/uL (ref 4.0–10.5)
nRBC: 0 % (ref 0.0–0.2)

## 2021-05-08 LAB — BASIC METABOLIC PANEL
Anion gap: 5 (ref 5–15)
BUN: 12 mg/dL (ref 6–20)
CO2: 30 mmol/L (ref 22–32)
Calcium: 10 mg/dL (ref 8.9–10.3)
Chloride: 99 mmol/L (ref 98–111)
Creatinine, Ser: 0.94 mg/dL (ref 0.44–1.00)
GFR, Estimated: >60 mL/min (ref >60)
Glucose, Bld: 226 mg/dL — ABNORMAL HIGH (ref 70–99)
Potassium: 3.6 mmol/L (ref 3.5–5.1)
Sodium: 134 mmol/L — ABNORMAL LOW (ref 135–145)

## 2021-05-08 LAB — COOXEMETRY PANEL
Carboxyhemoglobin: 1.3 % (ref 0.5–1.5)
Methemoglobin: 0.8 % (ref 0.0–1.5)
O2 Saturation: 59.8 %
Total hemoglobin: 10.6 g/dL — ABNORMAL LOW (ref 12.0–16.0)

## 2021-05-08 LAB — HEPARIN LEVEL (UNFRACTIONATED)
Heparin Unfractionated: 0.76 IU/mL — ABNORMAL HIGH (ref 0.30–0.70)
Heparin Unfractionated: 0.78 IU/mL — ABNORMAL HIGH (ref 0.30–0.70)

## 2021-05-08 LAB — MAGNESIUM: Magnesium: 2 mg/dL (ref 1.7–2.4)

## 2021-05-08 SURGERY — ECHOCARDIOGRAM, TRANSESOPHAGEAL
Anesthesia: Monitor Anesthesia Care

## 2021-05-08 MED ORDER — FUROSEMIDE 10 MG/ML IJ SOLN
80.0000 mg | Freq: Two times a day (BID) | INTRAMUSCULAR | Status: DC
  Administered 2021-05-08 – 2021-05-10 (×5): 80 mg via INTRAVENOUS
  Filled 2021-05-08 (×5): qty 8

## 2021-05-08 MED ORDER — POTASSIUM CHLORIDE CRYS ER 20 MEQ PO TBCR
40.0000 meq | EXTENDED_RELEASE_TABLET | Freq: Once | ORAL | Status: AC
  Administered 2021-05-08: 40 meq via ORAL
  Filled 2021-05-08: qty 2

## 2021-05-08 MED ORDER — METOLAZONE 2.5 MG PO TABS
2.5000 mg | ORAL_TABLET | Freq: Once | ORAL | Status: AC
  Administered 2021-05-08: 2.5 mg via ORAL
  Filled 2021-05-08: qty 1

## 2021-05-08 NOTE — Progress Notes (Signed)
ANTICOAGULATION CONSULT NOTE   Pharmacy Consult for Heparin  Indication: New onset atrial fibrillation  Allergies  Allergen Reactions   Lisinopril-Hydrochlorothiazide Anaphylaxis   Other Anaphylaxis   Lisinopril     Other reaction(s): Angioedema    Patient Measurements: Height: 5\' 3"  (160 cm) Weight: (!) 139.7 kg (307 lb 15.7 oz) (scale A) IBW/kg (Calculated) : 52.4 HEPARIN DW (KG): 89.2   Vital Signs: Temp: 98.3 F (36.8 C) (10/28 1151) Temp Source: Oral (10/28 1151) BP: 142/89 (10/28 1151) Pulse Rate: 100 (10/28 1151)  Labs: Recent Labs     0000 05/05/21 2301 05/06/21 0536 05/06/21 1438 05/07/21 0455 05/07/21 1620 05/08/21 0440 05/08/21 1200  HGB   < >  --  10.4*  --  9.8*  --  10.5*  --   HCT  --   --  32.4*  --  30.6*  --  33.1*  --   PLT  --   --  380  --  345  --  367  --   LABPROT  --  15.6*  --   --   --   --   --   --   INR  --  1.2  --   --   --   --   --   --   HEPARINUNFRC  --   --  0.19*   < > 0.69  --  0.76* 0.78*  CREATININE  --   --  0.93   < > 1.09* 0.97 0.94  --    < > = values in this interval not displayed.     Estimated Creatinine Clearance: 97.6 mL/min (by C-G formula based on SCr of 0.94 mg/dL).   Medical History: Past Medical History:  Diagnosis Date   Abnormal Pap smear of cervix    Allergy    Anemia    Fibroid    Hypertension    Leukocytosis, unspecified 10/18/2013   Obesity    Plantar fasciitis      Assessment: 50 y/o F with new onset atrial fibrillation on heparin. She is noted with acute HF on milrinone and for DCCV when more stable.   Repeat heparin level remains above goal despite rate adjustment earlier this am.  Goal of Therapy:  Heparin level 0.3-0.7 units/ml Monitor platelets by anticoagulation protocol: Yes   Plan:  -Reduce heparin infusion to 1350 units/h -Recheck heparin level with am labs  Arrie Senate, PharmD, BCPS, [facility] Clinical Pharmacist [phone] Please check AMION for all [place]  numbers 05/08/2021

## 2021-05-08 NOTE — Progress Notes (Addendum)
Advanced Heart Failure Rounding Note  PCP-Cardiologist: None   Subjective:    Co-ox 60% on 0.125 milrinone.   Remains on amio drip 60 mg per hour.   Diuresing with 40 mg tid. CVP 13-14.   Denies SOB.    Objective:   Weight Range: (!) 139.7 kg Body mass index is 54.56 kg/m.   Vital Signs:   Temp:  [97.9 F (36.6 C)-98.8 F (37.1 C)] 98.1 F (36.7 C) (10/28 0726) Pulse Rate:  [96-112] 98 (10/28 0726) Resp:  [17-23] 23 (10/28 0726) BP: (110-157)/(79-103) 110/86 (10/28 0726) SpO2:  [97 %-100 %] 100 % (10/28 0726) Weight:  [139.7 kg] 139.7 kg (10/28 0500) Last BM Date: 05/07/21  Weight change: Filed Weights   05/06/21 0516 05/07/21 0515 05/08/21 0500  Weight: (!) 141.7 kg (!) 140.6 kg (!) 139.7 kg    Intake/Output:   Intake/Output Summary (Last 24 hours) at 05/08/2021 0937 Last data filed at 05/08/2021 0726 Gross per 24 hour  Intake 3608.47 ml  Output 3600 ml  Net 8.47 ml      Physical Exam  CVP 13-14 General:  Sitting in the chair. No resp difficulty HEENT: normal Neck: supple. JVP 12-13  Carotids 2+ bilat; no bruits. No lymphadenopathy or thryomegaly appreciated. Cor: PMI nondisplaced. Irregular rate & rhythm. No rubs, gallops or murmurs. Lungs: clear Abdomen: soft, nontender, nondistended. No hepatosplenomegaly. No bruits or masses. Good bowel sounds. Extremities: no cyanosis, clubbing, rash, R and LLE 1+ edema. LUE PICC. Unna boots.  Neuro: alert & orientedx3, cranial nerves grossly intact. moves all 4 extremities w/o difficulty. Affect pleasant   Telemetry    A Fib 110s personally checked    Labs    CBC Recent Labs    05/07/21 0455 05/08/21 0440  WBC 10.0 8.9  HGB 9.8* 10.5*  HCT 30.6* 33.1*  MCV 94.4 94.6  PLT 345 354   Basic Metabolic Panel Recent Labs    05/06/21 1438 05/07/21 0455 05/07/21 1620 05/08/21 0440  NA 139   < > 132* 134*  K 3.5   < > 4.0 3.6  CL 103   < > 98 99  CO2 30   < > 28 30  GLUCOSE 120*   < > 263*  226*  BUN 16   < > 13 12  CREATININE 1.07*   < > 0.97 0.94  CALCIUM 9.8   < > 10.0 10.0  MG 1.8  --   --  2.0   < > = values in this interval not displayed.   Liver Function Tests No results for input(s): AST, ALT, ALKPHOS, BILITOT, PROT, ALBUMIN in the last 72 hours. No results for input(s): LIPASE, AMYLASE in the last 72 hours. Cardiac Enzymes No results for input(s): CKTOTAL, CKMB, CKMBINDEX, TROPONINI in the last 72 hours.  BNP: BNP (last 3 results) Recent Labs    05/03/21 2349  BNP 670.0*    ProBNP (last 3 results) No results for input(s): PROBNP in the last 8760 hours.   D-Dimer No results for input(s): DDIMER in the last 72 hours. Hemoglobin A1C Recent Labs    05/06/21 1438  HGBA1C 5.8*   Fasting Lipid Panel No results for input(s): CHOL, HDL, LDLCALC, TRIG, CHOLHDL, LDLDIRECT in the last 72 hours. Thyroid Function Tests No results for input(s): TSH, T4TOTAL, T3FREE, THYROIDAB in the last 72 hours.  Invalid input(s): FREET3  Other results:   Imaging    No results found.   Medications:     Scheduled Medications:  Chlorhexidine Gluconate Cloth  6 each Topical Daily   digoxin  0.125 mg Oral Daily   furosemide  40 mg Intravenous TID   losartan  50 mg Oral Daily   sodium chloride flush  10-40 mL Intracatheter Q12H   spironolactone  25 mg Oral Daily    Infusions:  amiodarone 60 mg/hr (05/08/21 0650)   heparin 1,500 Units/hr (05/08/21 1308)   milrinone 0.125 mcg/kg/min (05/08/21 0619)    PRN Medications: acetaminophen, albuterol, ALPRAZolam, benzonatate, docusate sodium, guaiFENesin, ondansetron (ZOFRAN) IV, sodium chloride flush    Patient Profile   Christine Oneal is a 50 y.o. female with history of HTN and obesity admitted with atrial fibrillation with RVR and acute systolic HF.   Assessment/Plan  Acute systolic HF with low output/cardiomyopathy: -Suspect etiology likely tachy mediated in setting of AF with RVR. Uncontrolled HTN may also be  playing a role. Myocarditis also a consideration. However, troponin flat and just minimally elevated.   -Echo with LVEF < 20%, moderate LVH, moderately reduced RV, severe MR -R/LHC once diuresed -May need to consider cardiac MRI -Co-ox 60% on 0.125 milrinone. Continue inotrope while diuresing. SBP 110 this morning. No room to titrate meds.  - CVP 13-14. Personally checked. Change lasix to 80 mg twice a day and give 2.5 mg metolazone today.  -Continue spiro to 25 mg daily. -Continue losartan 50 mg daily (no ARNI d/t hx angioedema with ACEi) - Continue digoxin 0.125 mg daily - Consider SGLT2i down the road.  --No BB d/t low output HF -Continue unna boots.    2. Atrial fibrillation with RVR: -New diagnosis this admit, suspect duration for several weeks based on onset of symptoms -TSH WNL -CHADS2-VASc score at least 3 (female, HTN, CHF), on heparin gtt. Eventually switch to eliquis -Continue amio gtt.  -Plan for TEE/DCCV once diuresed if does not chemically convert - Will need formal sleep study as outpatient   3. Mitral regurgitation: -Severe on echo -Suspect functional   4. HTN: -Stable.    5. Morbid obesity: -Body mass index is 54.56 kg/m.  6. Hypokalemia: -K 3.6  -Mag 2. Stable.  -Continue spiro to 25 mg daily   7. Possible LV thrombus: -Continue heparin drip.   RHC/LHC  TEE next week.   Length of Stay: 4  Amy Clegg, NP  05/08/2021, 9:37 AM  Advanced Heart Failure Team Pager [phone] (M-F; 7a - 5p)  Please contact [facility] for night-coverage after hours (5p -7a ) and weekends on [email or web address]  Patient seen and examined with the above-signed Advanced Practice Provider and/or Housestaff. I personally reviewed laboratory data, imaging studies and relevant notes. I independently examined the patient and formulated the important aspects of the plan. I have edited the note to reflect any of my changes or salient points. I have personally discussed the plan with the  patient and/or family.  Remains in AF with RVR despite IV amio. Volume remains elevated with CVP 14. Co-ox ok on low-dose milrinone  General:  Sitting in chair. No resp difficulty HEENT: normal Neck: supple. JVP to ear Carotids 2+ bilat; no bruits. No lymphadenopathy or thryomegaly appreciated. Cor: PMI nondisplaced. irrreg tachy  No rubs, gallops or murmurs. Lungs: clear Abdomen: obese soft, nontender, nondistended. No hepatosplenomegaly. No bruits or masses. Good bowel sounds. Extremities: no cyanosis, clubbing, rash, 2+ edema Neuro: alert & orientedx3, cranial nerves grossly intact. moves all 4 extremities w/o difficulty. Affect pleasant  Continue milrinone and IV amio. Needs aggressive diuresis. Plan R/L cath on Monday followed by TEE/DC-CV  on Tuesday.   Glori Bickers, MD  4:58 PM

## 2021-05-08 NOTE — Plan of Care (Signed)

## 2021-05-08 NOTE — Progress Notes (Signed)
ANTICOAGULATION CONSULT NOTE - Follow Up Consult  Pharmacy Consult for heparin Indication: atrial fibrillation  Labs: Recent Labs    05/05/21 2301 05/06/21 0536 05/06/21 1438 05/06/21 2310 05/07/21 0455 05/07/21 1620 05/08/21 0440  HGB  --  10.4*  --   --  9.8*  --  10.5*  HCT  --  32.4*  --   --  30.6*  --  33.1*  PLT  --  380  --   --  345  --  367  LABPROT 15.6*  --   --   --   --   --   --   INR 1.2  --   --   --   --   --   --   HEPARINUNFRC  --  0.19*   < > 0.64 0.69  --  0.76*  CREATININE  --  0.93   < >  --  1.09* 0.97 0.94   < > = values in this interval not displayed.    Assessment: 50yo female supratherapeutic on heparin with higher heparin level despite decreased rate; no infusion issues or signs of bleeding per RN.  Goal of Therapy:  Heparin level 0.3-0.7 units/ml   Plan:  Will decrease heparin infusion by 1 unit/kgABW/hr to 1500 units/hr and check level in 6 hours.    Wynona Neat, PharmD, BCPS  05/08/2021,5:34 AM

## 2021-05-09 LAB — CBC
HCT: 32.9 % — ABNORMAL LOW (ref 36.0–46.0)
Hemoglobin: 10.8 g/dL — ABNORMAL LOW (ref 12.0–15.0)
MCH: 30.8 pg (ref 26.0–34.0)
MCHC: 32.8 g/dL (ref 30.0–36.0)
MCV: 93.7 fL (ref 80.0–100.0)
Platelets: 371 10*3/uL (ref 150–400)
RBC: 3.51 MIL/uL — ABNORMAL LOW (ref 3.87–5.11)
RDW: 15 % (ref 11.5–15.5)
WBC: 7.6 10*3/uL (ref 4.0–10.5)
nRBC: 0 % (ref 0.0–0.2)

## 2021-05-09 LAB — COOXEMETRY PANEL
Carboxyhemoglobin: 1.3 % (ref 0.5–1.5)
Methemoglobin: 0.9 % (ref 0.0–1.5)
O2 Saturation: 71.8 %
Total hemoglobin: 12.1 g/dL (ref 12.0–16.0)

## 2021-05-09 LAB — BASIC METABOLIC PANEL
Anion gap: 12 (ref 5–15)
BUN: 14 mg/dL (ref 6–20)
CO2: 28 mmol/L (ref 22–32)
Calcium: 10 mg/dL (ref 8.9–10.3)
Chloride: 91 mmol/L — ABNORMAL LOW (ref 98–111)
Creatinine, Ser: 1.09 mg/dL — ABNORMAL HIGH (ref 0.44–1.00)
GFR, Estimated: >60 mL/min (ref >60)
Glucose, Bld: 336 mg/dL — ABNORMAL HIGH (ref 70–99)
Potassium: 3.5 mmol/L (ref 3.5–5.1)
Sodium: 131 mmol/L — ABNORMAL LOW (ref 135–145)

## 2021-05-09 LAB — MAGNESIUM: Magnesium: 2.1 mg/dL (ref 1.7–2.4)

## 2021-05-09 LAB — HEPARIN LEVEL (UNFRACTIONATED): Heparin Unfractionated: 0.54 IU/mL (ref 0.30–0.70)

## 2021-05-09 MED ORDER — POTASSIUM CHLORIDE CRYS ER 20 MEQ PO TBCR
40.0000 meq | EXTENDED_RELEASE_TABLET | Freq: Once | ORAL | Status: AC
  Administered 2021-05-09: 40 meq via ORAL
  Filled 2021-05-09: qty 2

## 2021-05-09 MED ORDER — MAGNESIUM HYDROXIDE 400 MG/5ML PO SUSP
15.0000 mL | Freq: Every day | ORAL | Status: DC | PRN
  Administered 2021-05-09: 15 mL via ORAL
  Filled 2021-05-09: qty 30

## 2021-05-09 NOTE — Progress Notes (Signed)
Progress Note  Patient Name: Christine Oneal Date of Encounter: 05/09/2021  [facility] Cardiologist: Dr Debara Pickett  Subjective   No CP or dyspnea  Inpatient Medications    Scheduled Meds:  Chlorhexidine Gluconate Cloth  6 each Topical Daily   digoxin  0.125 mg Oral Daily   furosemide  80 mg Intravenous BID   losartan  50 mg Oral Daily   sodium chloride flush  10-40 mL Intracatheter Q12H   spironolactone  25 mg Oral Daily   Continuous Infusions:  amiodarone 60 mg/hr (05/09/21 0600)   heparin 1,350 Units/hr (05/09/21 0600)   milrinone 0.125 mcg/kg/min (05/09/21 0600)   PRN Meds: acetaminophen, albuterol, ALPRAZolam, benzonatate, docusate sodium, guaiFENesin, ondansetron (ZOFRAN) IV, sodium chloride flush   Vital Signs    Vitals:   05/08/21 1955 05/08/21 2000 05/08/21 2316 05/09/21 0500  BP: 129/86 129/86 103/70 135/84  Pulse: (!) 104 (!) 104 94 82  Resp:  20 (!) 23 19  Temp: 98.7 F (37.1 C)  (!) 97.3 F (36.3 C) 97.8 F (36.6 C)  TempSrc: Oral  Oral Oral  SpO2: 96%  93% 97%  Weight:    (!) 136.3 kg  Height:        Intake/Output Summary (Last 24 hours) at 05/09/2021 0737 Last data filed at 05/09/2021 0600 Gross per 24 hour  Intake 2231.52 ml  Output 4950 ml  Net -2718.48 ml   Last 3 Weights 05/09/2021 05/08/2021 05/07/2021  Weight (lbs) 300 lb 7.8 oz 307 lb 15.7 oz 309 lb 15.5 oz  Weight (kg) 136.3 kg 139.7 kg 140.6 kg      Telemetry    Atrial fibrillation rate controlled- Personally Reviewed   Physical Exam   GEN: No acute distress.   Neck: No JVD Cardiac: Irregular Respiratory: Clear to auscultation bilaterally. GI: Soft, nontender, non-distended  MS: No edema Neuro:  Nonfocal  Psych: Normal affect   Labs    High Sensitivity Troponin:   Recent Labs  Lab 05/04/21 0007 05/04/21 0149  TROPONINIHS 48* 51*     Chemistry Recent Labs  Lab 05/06/21 1438 05/07/21 0455 05/07/21 1620 05/08/21 0440 05/09/21 0313  NA 139   < > 132*  134* 131*  K 3.5   < > 4.0 3.6 3.5  CL 103   < > 98 99 91*  CO2 30   < > 28 30 28   GLUCOSE 120*   < > 263* 226* 336*  BUN 16   < > 13 12 14   CREATININE 1.07*   < > 0.97 0.94 1.09*  CALCIUM 9.8   < > 10.0 10.0 10.0  MG 1.8  --   --  2.0 2.1  GFRNONAA >60   < > >60 >60 >60  ANIONGAP 6   < > 6 5 12    < > = values in this interval not displayed.   Hematology Recent Labs  Lab 05/07/21 0455 05/08/21 0440 05/09/21 0313  WBC 10.0 8.9 7.6  RBC 3.24* 3.50* 3.51*  HGB 9.8* 10.5* 10.8*  HCT 30.6* 33.1* 32.9*  MCV 94.4 94.6 93.7  MCH 30.2 30.0 30.8  MCHC 32.0 31.7 32.8  RDW 15.1 15.2 15.0  PLT 345 367 371   Thyroid  Recent Labs  Lab 05/04/21 0404  TSH 1.563    BNP Recent Labs  Lab 05/03/21 2349  BNP 670.0*   Patient Profile     50 y.o. female with past medical history of hypertension admitted with acute systolic congestive heart failure and atrial  fibrillation.  Echocardiogram shows ejection fraction less than 20%, apical akinesis and early thrombus cannot be excluded, moderate left ventricular enlargement, moderate left ventricular hypertrophy, moderate RV dysfunction with moderate RV enlargement, mild biatrial enlargement, severe mitral regurgitation, mild to moderate tricuspid regurgitation and mild aortic insufficiency.  Assessment & Plan    1 acute systolic congestive heart failure-Wt 136.3 kg; [id number]/[id number] since admission. Coox 71.8.  Plan to continue Lasix and spironolactone at present dose.  Follow renal function.  Continue milrinone.  We will ultimately need to add [place].  2 cardiomyopathy-etiology unclear.  Possibly tachycardia mediated or hypertensive mediated.  Plan is for right and left cardiac catheterization on Monday to exclude coronary disease.  We will continue losartan at present dose.  Add beta-blocker later as heart failure improves.  3 persistent atrial fibrillation-patient remains in atrial fibrillation.  Continue amiodarone and digoxin.  Heart rate is  controlled.  Continue heparin.  Plan is for TEE guided cardioversion next week.  4 severe mitral regurgitation-patient will need follow-up echocardiograms once CHF improves in sinus restored.  5 question apical thrombus-patient will be anticoagulated for atrial fibrillation.  This will also cover any apical thrombus.  For questions or updates, please contact [place] Please consult [email or web address] for contact info under        Signed, Kirk Ruths, MD  05/09/2021, 7:37 AM

## 2021-05-09 NOTE — Evaluation (Signed)
Physical Therapy Evaluation Patient Details Name: Christine Oneal MRN: [id number] DOB: 11-23-1970 Today's Date: 05/09/2021  History of Present Illness  The pt is a 50 yo female presenting 69 with c/o SOB x3 weeks, found to be in afib with RVR upon arrival of EMS. Plan for TEE guided cardioversion. PMH includes: HTN, obesity, and plantar fasciitis.   Clinical Impression  Pt in bed upon arrival of PT, agreeable to evaluation at this time. Prior to admission the pt was independent with all mobility, but reports progressive decline right before admission where she was limited to short distance ambulation (10-41ft) in her apt and needing increased assist from her daughter for ADLs. The pt now presents with limitations in functional mobility, activity tolerance, and endurance due to above dx, and will continue to benefit from skilled PT to address these deficits. She was able to complete multiple sit-stand transfers without assist or need for UE support, but is limited by fatigue and HR elevation with gait needing frequent standing rest breaks. The pt's HR increased to 138bpm max with ambulation and 147bpm max with x2 step ups, returned to 120s with standing rest. Will continue to benefit from skilled PT acutely and HHPT to maximize safety and independence in home environment.         Recommendations for follow up therapy are one component of a multi-disciplinary discharge planning process, led by the attending physician.  Recommendations may be updated based on patient status, additional functional criteria and insurance authorization.  Follow Up Recommendations Home health PT    Assistance Recommended at Discharge Intermittent Supervision/Assistance  Functional Status Assessment Patient has had a recent decline in their functional status and demonstrates the ability to make significant improvements in function in a reasonable and predictable amount of time.  Equipment Recommendations  Rollator (4  wheels)    Recommendations for Other Services       Precautions / Restrictions Precautions Precautions: Other (comment) Precaution Comments: watch HR Restrictions Weight Bearing Restrictions: No      Mobility  Bed Mobility Overal bed mobility: Needs Assistance Bed Mobility: Supine to Sit     Supine to sit: Supervision     General bed mobility comments: increased time and use of bed rails    Transfers Overall transfer level: Needs assistance Equipment used: None Transfers: Sit to/from Stand Sit to Stand: Supervision           General transfer comment: completed multiple times from sufaces of varying heights no assist    Ambulation/Gait Ambulation/Gait assistance: Supervision Gait Distance (Feet): 200 Feet (frequent standing rest breaks every ~30-50 ft) Assistive device: None Gait Pattern/deviations: Step-to pattern;Decreased stride length;Wide base of support Gait velocity: decreased Gait velocity interpretation: <1.31 ft/sec, indicative of household ambulator General Gait Details: pt with small steps and slowed gait. no overt LOB but very slow movements. HR 120-140s with exertion. recovers well with standing rest  Stairs Stairs: Yes Stairs assistance: Min guard Stair Management: One rail Left;Step to pattern;Forwards Number of Stairs: 2 General stair comments: HR elevation to 142 bpm, good stabiltiy with single UE support  Wheelchair Mobility    Modified Rankin (Stroke Patients Only)       Balance Overall balance assessment: Mild deficits observed, not formally tested                                           Pertinent Vitals/Pain Pain  Assessment: No/denies pain    Home Living Family/patient expects to be discharged to:: Private residence Living Arrangements: Children Available Help at Discharge: Family;Available PRN/intermittently (daughter home until 2PM each day) Type of Home: Apartment Home Access: Stairs to  enter Entrance Stairs-Rails: Left Entrance Stairs-Number of Steps: 14   Home Layout: One level Home Equipment: Grab bars - tub/shower      Prior Function Prior Level of Function : Independent/Modified Independent;Driving             Mobility Comments: progressively more challenging prior to admission but able to do with rest       Hand Dominance   Dominant Hand: Right    Extremity/Trunk Assessment   Upper Extremity Assessment Upper Extremity Assessment: Overall WFL for tasks assessed    Lower Extremity Assessment Lower Extremity Assessment: Overall WFL for tasks assessed (poor muscular endurance)    Cervical / Trunk Assessment Cervical / Trunk Assessment: Other exceptions Cervical / Trunk Exceptions: large body habitus  Communication   Communication: No difficulties  Cognition Arousal/Alertness: Awake/alert Behavior During Therapy: WFL for tasks assessed/performed Overall Cognitive Status: Within Functional Limits for tasks assessed                                          General Comments General comments (skin integrity, edema, etc.): HR 100-120s at rest, high of 147bpm with ambulation and stairs but generally recovers to 120s with standing rest    Exercises     Assessment/Plan    PT Assessment Patient needs continued PT services  PT Problem List Decreased range of motion;Decreased activity tolerance;Decreased mobility;Decreased balance;Decreased coordination;Decreased cognition;Cardiopulmonary status limiting activity       PT Treatment Interventions DME instruction;Gait training;Stair training;Functional mobility training;Therapeutic activities;Therapeutic exercise;Balance training;Patient/family education    PT Goals (Current goals can be found in the Care Plan section)  Acute Rehab PT Goals Patient Stated Goal: return home PT Goal Formulation: With patient Time For Goal Achievement: 05/23/21 Potential to Achieve Goals: Good     Frequency Min 3X/week   Barriers to discharge Inaccessible home environment pt with flight of stairs to enter    Co-evaluation               AM-PAC PT "6 Clicks" Mobility  Outcome Measure Help needed turning from your back to your side while in a flat bed without using bedrails?: None Help needed moving from lying on your back to sitting on the side of a flat bed without using bedrails?: None Help needed moving to and from a bed to a chair (including a wheelchair)?: A Little Help needed standing up from a chair using your arms (e.g., wheelchair or bedside chair)?: A Little Help needed to walk in hospital room?: A Little Help needed climbing 3-5 steps with a railing? : A Little 6 Click Score: 20    End of Session Equipment Utilized During Treatment: Gait belt Activity Tolerance: Patient tolerated treatment well Patient left: in chair;with call bell/phone within reach Nurse Communication: Mobility status PT Visit Diagnosis: Other abnormalities of gait and mobility (R26.89);Muscle weakness (generalized) (M62.81)    Time: 3500-9381 PT Time Calculation (min) (ACUTE ONLY): 39 min   Charges:   PT Evaluation $PT Eval Low Complexity: 1 Low PT Treatments $Gait Training: 8-22 mins        West Carbo, PT, DPT   Acute Rehabilitation Department Pager #: [phone]  Sandra Cockayne 05/09/2021, 5:41 PM

## 2021-05-09 NOTE — Progress Notes (Signed)
CARDIAC REHAB PHASE I   PRE:  Rate/Rhythm: 102 Afib  BP:  Supine:   Sitting: 141/99  Standing:    SaO2: 98% RA  MODE:  Ambulation: 250 ft   POST:  Rate/Rhythm: 141 Afib  BP:  Supine: 150/100 Sitting:   Standing:    SaO2: 99% RA  3917-9217 Patient ambulated 250 feet with assist x1, gait slow, steady, one standing rest break taken. Blood pressure elevated before and after walk, no symptoms with ambulation. To bed after walk with IV intact, call bell within reach.  Sol Passer, MS, ACSM CEP

## 2021-05-09 NOTE — Plan of Care (Signed)

## 2021-05-09 NOTE — Plan of Care (Signed)

## 2021-05-09 NOTE — Progress Notes (Signed)
ANTICOAGULATION CONSULT NOTE   Pharmacy Consult for Heparin  Indication: New onset atrial fibrillation  Allergies  Allergen Reactions   Lisinopril-Hydrochlorothiazide Anaphylaxis   Other Anaphylaxis   Lisinopril     Other reaction(s): Angioedema    Patient Measurements: Height: 5\' 3"  (160 cm) Weight: (!) 136.3 kg (300 lb 7.8 oz) IBW/kg (Calculated) : 52.4 HEPARIN DW (KG): 89.2   Vital Signs: Temp: 98.5 F (36.9 C) (10/29 0750) Temp Source: Oral (10/29 0750) BP: 141/80 (10/29 0750) Pulse Rate: 98 (10/29 0750)  Labs: Recent Labs    05/07/21 0455 05/07/21 1620 05/08/21 0440 05/08/21 1200 05/09/21 0313  HGB 9.8*  --  10.5*  --  10.8*  HCT 30.6*  --  33.1*  --  32.9*  PLT 345  --  367  --  371  HEPARINUNFRC 0.69  --  0.76* 0.78* 0.54  CREATININE 1.09* 0.97 0.94  --  1.09*     Estimated Creatinine Clearance: 82.9 mL/min (A) (by C-G formula based on SCr of 1.09 mg/dL (H)).   Medical History: Past Medical History:  Diagnosis Date   Abnormal Pap smear of cervix    Allergy    Anemia    Fibroid    Hypertension    Leukocytosis, unspecified 10/18/2013   Obesity    Plantar fasciitis      Assessment: 50 y/o F with new onset atrial fibrillation on heparin. She is noted with acute HF on milrinone and planned for R/L [facility] Monday and DCCV when more stable.   Repeat heparin level 0.54 remains at  goal don heparin drip 1350 uts/hr  No bleeding noted   Goal of Therapy:  Heparin level 0.3-0.7 units/ml Monitor platelets by anticoagulation protocol: Yes   Plan:  Continue heparin infusion to 1350 units/h -Recheck heparin level with am labs   [facility] Pharm.D. CPP, BCPS Clinical Pharmacist [phone] 05/09/2021 3:36 PM   Please check AMION for all [place] numbers 05/09/2021

## 2021-05-10 ENCOUNTER — Encounter (HOSPITAL_COMMUNITY): Payer: Self-pay | Admitting: Internal Medicine

## 2021-05-10 LAB — BASIC METABOLIC PANEL
Anion gap: 7 (ref 5–15)
BUN: 15 mg/dL (ref 6–20)
CO2: 29 mmol/L (ref 22–32)
Calcium: 11 mg/dL — ABNORMAL HIGH (ref 8.9–10.3)
Chloride: 97 mmol/L — ABNORMAL LOW (ref 98–111)
Creatinine, Ser: 1.13 mg/dL — ABNORMAL HIGH (ref 0.44–1.00)
GFR, Estimated: 59 mL/min — ABNORMAL LOW (ref >60)
Glucose, Bld: 125 mg/dL — ABNORMAL HIGH (ref 70–99)
Potassium: 3.7 mmol/L (ref 3.5–5.1)
Sodium: 133 mmol/L — ABNORMAL LOW (ref 135–145)

## 2021-05-10 LAB — CBC
HCT: 37.7 % (ref 36.0–46.0)
Hemoglobin: 12 g/dL (ref 12.0–15.0)
MCH: 29.5 pg (ref 26.0–34.0)
MCHC: 31.8 g/dL (ref 30.0–36.0)
MCV: 92.6 fL (ref 80.0–100.0)
Platelets: 339 10*3/uL (ref 150–400)
RBC: 4.07 MIL/uL (ref 3.87–5.11)
RDW: 14.6 % (ref 11.5–15.5)
WBC: 7.2 10*3/uL (ref 4.0–10.5)
nRBC: 0 % (ref 0.0–0.2)

## 2021-05-10 LAB — COOXEMETRY PANEL
Carboxyhemoglobin: 1.2 % (ref 0.5–1.5)
Methemoglobin: 0.8 % (ref 0.0–1.5)
O2 Saturation: 69.7 %
Total hemoglobin: 12.2 g/dL (ref 12.0–16.0)

## 2021-05-10 LAB — HEPARIN LEVEL (UNFRACTIONATED)
Heparin Unfractionated: 0.74 IU/mL — ABNORMAL HIGH (ref 0.30–0.70)
Heparin Unfractionated: 0.49 IU/mL (ref 0.30–0.70)

## 2021-05-10 LAB — MAGNESIUM: Magnesium: 2.4 mg/dL (ref 1.7–2.4)

## 2021-05-10 MED ORDER — ASPIRIN 81 MG PO CHEW
81.0000 mg | CHEWABLE_TABLET | ORAL | Status: AC
  Administered 2021-05-11: 81 mg via ORAL
  Filled 2021-05-10: qty 1

## 2021-05-10 MED ORDER — POTASSIUM CHLORIDE CRYS ER 20 MEQ PO TBCR
40.0000 meq | EXTENDED_RELEASE_TABLET | Freq: Once | ORAL | Status: AC
  Administered 2021-05-10: 40 meq via ORAL
  Filled 2021-05-10: qty 2

## 2021-05-10 NOTE — Progress Notes (Signed)
[place] for Heparin  Indication: New onset atrial fibrillation  Allergies  Allergen Reactions   Lisinopril-Hydrochlorothiazide Anaphylaxis   Other Anaphylaxis   Lisinopril     Other reaction(s): Angioedema    Patient Measurements: Height: 5\' 3"  (160 cm) Weight: 134 kg (295 lb 6.7 oz) IBW/kg (Calculated) : 52.4 HEPARIN DW (KG): 89.2   Vital Signs: Temp: 98 F (36.7 C) (10/30 0450) Temp Source: Oral (10/30 0450) BP: 115/67 (10/30 0450) Pulse Rate: 86 (10/30 0450)  Labs: Recent Labs    05/07/21 1620 05/08/21 0440 05/08/21 0440 05/08/21 1200 05/09/21 0313 05/10/21 0400  HGB  --  10.5*   < >  --  10.8* 12.0  HCT  --  33.1*  --   --  32.9* 37.7  PLT  --  367  --   --  371 339  HEPARINUNFRC  --  0.76*   < > 0.78* 0.54 0.74*  CREATININE 0.97 0.94  --   --  1.09*  --    < > = values in this interval not displayed.     Estimated Creatinine Clearance: 81.9 mL/min (A) (by C-G formula based on SCr of 1.09 mg/dL (H)).  Assessment: 50 y/o F with new onset atrial fibrillation on heparin. She is noted with acute HF on milrinone and planned for R/L [facility] Monday and DCCV when more stable.   Heparin level up to slightly supratherapeutic (0.74) on gtt at 1350 units/hr. No bleeding noted.  Goal of Therapy:  Heparin level 0.3-0.7 units/ml Monitor platelets by anticoagulation protocol: Yes   Plan:  Decrease heparin infusion to 1250 units/h F/u 6 hr heparin level  Sherlon Handing, PharmD, BCPS Please see amion for complete clinical pharmacist phone list 05/10/2021 5:00 AM

## 2021-05-10 NOTE — Progress Notes (Signed)
[place] for Heparin  Indication: New onset atrial fibrillation  Allergies  Allergen Reactions   Lisinopril-Hydrochlorothiazide Anaphylaxis   Other Anaphylaxis   Lisinopril     Other reaction(s): Angioedema    Patient Measurements: Height: 5\' 3"  (160 cm) Weight: 134 kg (295 lb 6.7 oz) IBW/kg (Calculated) : 52.4 HEPARIN DW (KG): 89.2   Vital Signs: Temp: 97.9 F (36.6 C) (10/30 1141) Temp Source: Oral (10/30 1141) BP: 123/91 (10/30 0734) Pulse Rate: 95 (10/30 0901)  Labs: Recent Labs    05/08/21 0440 05/08/21 1200 05/09/21 0313 05/10/21 0400 05/10/21 1254  HGB 10.5*  --  10.8* 12.0  --   HCT 33.1*  --  32.9* 37.7  --   PLT 367  --  371 339  --   HEPARINUNFRC 0.76*   < > 0.54 0.74* 0.49  CREATININE 0.94  --  1.09* 1.13*  --    < > = values in this interval not displayed.     Estimated Creatinine Clearance: 79 mL/min (A) (by C-G formula based on SCr of 1.13 mg/dL (H)).   Medical History: Past Medical History:  Diagnosis Date   Abnormal Pap smear of cervix    Allergy    Anemia    Fibroid    Hypertension    Leukocytosis, unspecified 10/18/2013   Obesity    Plantar fasciitis      Assessment: 50 y/o F with new onset atrial fibrillation on heparin. She is noted with acute HF on milrinone and planned for R/L [facility] Monday and DCCV when more stable.  Heparin level 0.49 and remains at  goal on heparin drip 1250 uts/hr  No bleeding noted   Goal of Therapy:  Heparin level 0.3-0.7 units/ml Monitor platelets by anticoagulation protocol: Yes   Plan:  Continue heparin infusion to 1250 units/h -Recheck heparin level with am labs   [facility] Pharm.D. CPP, BCPS Clinical Pharmacist [phone] 05/10/2021 3:08 PM   Please check AMION for all [place] numbers 05/10/2021

## 2021-05-10 NOTE — Progress Notes (Signed)
Progress Note  Patient Name: Christine Oneal Date of Encounter: 05/10/2021  [facility] Cardiologist: Dr Debara Pickett  Subjective   Pt denies CP; dyspnea continues to improve  Inpatient Medications    Scheduled Meds:  Chlorhexidine Gluconate Cloth  6 each Topical Daily   digoxin  0.125 mg Oral Daily   furosemide  80 mg Intravenous BID   losartan  50 mg Oral Daily   sodium chloride flush  10-40 mL Intracatheter Q12H   spironolactone  25 mg Oral Daily   Continuous Infusions:  amiodarone 60 mg/hr (05/10/21 0523)   heparin 1,250 Units/hr (05/10/21 0523)   milrinone 0.125 mcg/kg/min (05/10/21 0523)   PRN Meds: acetaminophen, albuterol, ALPRAZolam, benzonatate, docusate sodium, guaiFENesin, magnesium hydroxide, ondansetron (ZOFRAN) IV, sodium chloride flush   Vital Signs    Vitals:   05/09/21 2300 05/09/21 2339 05/10/21 0450 05/10/21 0734  BP:  132/87 115/67 (!) 123/91  Pulse: 87 96 86 88  Resp: 20 20 20 20   Temp:  97.8 F (36.6 C) 98 F (36.7 C) 97.8 F (36.6 C)  TempSrc:  Oral Oral Oral  SpO2: 95% 94% 95% 98%  Weight:   134 kg   Height:        Intake/Output Summary (Last 24 hours) at 05/10/2021 0830 Last data filed at 05/10/2021 0523 Gross per 24 hour  Intake 1564.43 ml  Output 2800 ml  Net -1235.57 ml    Last 3 Weights 05/10/2021 05/09/2021 05/08/2021  Weight (lbs) 295 lb 6.7 oz 300 lb 7.8 oz 307 lb 15.7 oz  Weight (kg) 134 kg 136.3 kg 139.7 kg      Telemetry    Atrial fibrillation with PVCs or aberrancy; rate controlled- Personally Reviewed   Physical Exam   GEN: Obese NAD Neck: Supple Cardiac: Irregular, no murmur Respiratory: CTA GI: Soft, NT/ND MS: 1+ edema Neuro:  Grossly intact Psych: Normal affect   Labs    High Sensitivity Troponin:   Recent Labs  Lab 05/04/21 0007 05/04/21 0149  TROPONINIHS 48* 51*      Chemistry Recent Labs  Lab 05/08/21 0440 05/09/21 0313 05/10/21 0400  NA 134* 131* 133*  K 3.6 3.5 3.7  CL 99 91*  97*  CO2 30 28 29   GLUCOSE 226* 336* 125*  BUN 12 14 15   CREATININE 0.94 1.09* 1.13*  CALCIUM 10.0 10.0 11.0*  MG 2.0 2.1 2.4  GFRNONAA >60 >60 59*  ANIONGAP 5 12 7     Hematology Recent Labs  Lab 05/08/21 0440 05/09/21 0313 05/10/21 0400  WBC 8.9 7.6 7.2  RBC 3.50* 3.51* 4.07  HGB 10.5* 10.8* 12.0  HCT 33.1* 32.9* 37.7  MCV 94.6 93.7 92.6  MCH 30.0 30.8 29.5  MCHC 31.7 32.8 31.8  RDW 15.2 15.0 14.6  PLT 367 371 339    Thyroid  Recent Labs  Lab 05/04/21 0404  TSH 1.563     BNP Recent Labs  Lab 05/03/21 2349  BNP 670.0*    Patient Profile     50 y.o. female with past medical history of hypertension admitted with acute systolic congestive heart failure and atrial fibrillation.  Echocardiogram shows ejection fraction less than 20%, apical akinesis and early thrombus cannot be excluded, moderate left ventricular enlargement, moderate left ventricular hypertrophy, moderate RV dysfunction with moderate RV enlargement, mild biatrial enlargement, severe mitral regurgitation, mild to moderate tricuspid regurgitation and mild aortic insufficiency.  Assessment & Plan    1 acute systolic congestive heart failure-Wt 134 kg; I/O-10,044 since admission. Coox 69.7.  Volume status continues to improve.  We will continue milrinone and diuretics at present dose.  We will ultimately need to add [place].  2 cardiomyopathy-etiology unclear.  Possibly tachycardia mediated or hypertensive mediated.  Plan is for right and left cardiac catheterization on Monday to exclude coronary disease.  We will continue losartan at present dose.  Add beta-blocker later as heart failure improves.  3 persistent atrial fibrillation-patient remains in atrial fibrillation.  Continue amiodarone (change to 30 mg/hr) and digoxin.  Heart rate is controlled.  Continue heparin.  Transition to apixaban once all procedures complete.  Plan is for TEE guided cardioversion next week.  4 severe mitral  regurgitation-will be reassessed at time of TEE.  Will likely need follow-up echocardiograms as CHF improves in sinus restored.  5 question apical thrombus-patient will be anticoagulated for atrial fibrillation.  This will also cover any apical thrombus.  For questions or updates, please contact [place] Please consult [email or web address] for contact info under        Signed, Kirk Ruths, MD  05/10/2021, 8:30 AM

## 2021-05-11 ENCOUNTER — Encounter (HOSPITAL_COMMUNITY)
Admission: EM | Disposition: A | Discharge status: Home Health | Payer: Self-pay | Source: Home / Self Care | Attending: Internal Medicine

## 2021-05-11 ENCOUNTER — Encounter (HOSPITAL_COMMUNITY): Payer: Self-pay | Admitting: Internal Medicine

## 2021-05-11 HISTORY — PX: RIGHT/LEFT HEART CATH AND CORONARY ANGIOGRAPHY: CATH118266

## 2021-05-11 LAB — BASIC METABOLIC PANEL
Anion gap: 6 (ref 5–15)
BUN: 17 mg/dL (ref 6–20)
CO2: 30 mmol/L (ref 22–32)
Calcium: 11.5 mg/dL — ABNORMAL HIGH (ref 8.9–10.3)
Chloride: 98 mmol/L (ref 98–111)
Creatinine, Ser: 1.16 mg/dL — ABNORMAL HIGH (ref 0.44–1.00)
GFR, Estimated: 57 mL/min — ABNORMAL LOW (ref >60)
Glucose, Bld: 133 mg/dL — ABNORMAL HIGH (ref 70–99)
Potassium: 3.6 mmol/L (ref 3.5–5.1)
Sodium: 134 mmol/L — ABNORMAL LOW (ref 135–145)

## 2021-05-11 LAB — CBC
HCT: 39.2 % (ref 36.0–46.0)
Hemoglobin: 12.8 g/dL (ref 12.0–15.0)
MCH: 30.3 pg (ref 26.0–34.0)
MCHC: 32.7 g/dL (ref 30.0–36.0)
MCV: 92.9 fL (ref 80.0–100.0)
Platelets: 410 10*3/uL — ABNORMAL HIGH (ref 150–400)
RBC: 4.22 MIL/uL (ref 3.87–5.11)
RDW: 14.6 % (ref 11.5–15.5)
WBC: 9 10*3/uL (ref 4.0–10.5)
nRBC: 0 % (ref 0.0–0.2)

## 2021-05-11 LAB — MAGNESIUM: Magnesium: 2.5 mg/dL — ABNORMAL HIGH (ref 1.7–2.4)

## 2021-05-11 LAB — COOXEMETRY PANEL
Carboxyhemoglobin: 1.3 % (ref 0.5–1.5)
Methemoglobin: 0.7 % (ref 0.0–1.5)
O2 Saturation: 64.5 %
Total hemoglobin: 13.3 g/dL (ref 12.0–16.0)

## 2021-05-11 LAB — CARDIAC CATHETERIZATION: Cath EF Quantitative: 25 %

## 2021-05-11 LAB — HEPARIN LEVEL (UNFRACTIONATED): Heparin Unfractionated: 0.49 IU/mL (ref 0.30–0.70)

## 2021-05-11 SURGERY — RIGHT/LEFT HEART CATH AND CORONARY ANGIOGRAPHY
Anesthesia: LOCAL

## 2021-05-11 MED ORDER — SODIUM CHLORIDE 0.9 % IV SOLN: INTRAVENOUS | Status: DC

## 2021-05-11 MED ORDER — MIDAZOLAM HCL 2 MG/2ML IJ SOLN
INTRAMUSCULAR | Status: AC
  Filled 2021-05-11: qty 2

## 2021-05-11 MED ORDER — HEPARIN SODIUM (PORCINE) 1000 UNIT/ML IJ SOLN
INTRAMUSCULAR | Status: AC
  Filled 2021-05-11: qty 1

## 2021-05-11 MED ORDER — HYDRALAZINE HCL 20 MG/ML IJ SOLN: 10.0000 mg | INTRAMUSCULAR | Status: AC | PRN

## 2021-05-11 MED ORDER — SODIUM CHLORIDE 0.9 % IV SOLN: 250.0000 mL | INTRAVENOUS | Status: DC | PRN

## 2021-05-11 MED ORDER — VERAPAMIL HCL 2.5 MG/ML IV SOLN
INTRAVENOUS | Status: DC | PRN
  Administered 2021-05-11: 10 mL via INTRA_ARTERIAL

## 2021-05-11 MED ORDER — HEPARIN SODIUM (PORCINE) 1000 UNIT/ML IJ SOLN
INTRAMUSCULAR | Status: DC | PRN
  Administered 2021-05-11: 6500 [IU] via INTRAVENOUS

## 2021-05-11 MED ORDER — APIXABAN 5 MG PO TABS
5.0000 mg | ORAL_TABLET | Freq: Two times a day (BID) | ORAL | Status: DC
  Administered 2021-05-11 – 2021-05-14 (×7): 5 mg via ORAL
  Filled 2021-05-11 (×7): qty 1

## 2021-05-11 MED ORDER — ACETAMINOPHEN 325 MG PO TABS: 650.0000 mg | ORAL_TABLET | ORAL | Status: DC | PRN

## 2021-05-11 MED ORDER — LIDOCAINE HCL (PF) 1 % IJ SOLN
INTRAMUSCULAR | Status: DC | PRN
  Administered 2021-05-11 (×2): 2 mL

## 2021-05-11 MED ORDER — VERAPAMIL HCL 2.5 MG/ML IV SOLN
INTRAVENOUS | Status: AC
  Filled 2021-05-11: qty 2

## 2021-05-11 MED ORDER — HEPARIN (PORCINE) IN NACL 1000-0.9 UT/500ML-% IV SOLN
INTRAVENOUS | Status: AC
  Filled 2021-05-11: qty 1000

## 2021-05-11 MED ORDER — FENTANYL CITRATE (PF) 100 MCG/2ML IJ SOLN
INTRAMUSCULAR | Status: AC
  Filled 2021-05-11: qty 2

## 2021-05-11 MED ORDER — MIDAZOLAM HCL 2 MG/2ML IJ SOLN
INTRAMUSCULAR | Status: DC | PRN
  Administered 2021-05-11: 1 mg via INTRAVENOUS

## 2021-05-11 MED ORDER — FENTANYL CITRATE (PF) 100 MCG/2ML IJ SOLN
INTRAMUSCULAR | Status: DC | PRN
  Administered 2021-05-11: 25 ug via INTRAVENOUS

## 2021-05-11 MED ORDER — LIDOCAINE HCL (PF) 1 % IJ SOLN
INTRAMUSCULAR | Status: AC
  Filled 2021-05-11: qty 30

## 2021-05-11 MED ORDER — SODIUM CHLORIDE 0.9% FLUSH: 3.0000 mL | Freq: Two times a day (BID) | INTRAVENOUS | Status: DC

## 2021-05-11 MED ORDER — LABETALOL HCL 5 MG/ML IV SOLN: 10.0000 mg | INTRAVENOUS | Status: AC | PRN

## 2021-05-11 MED ORDER — ONDANSETRON HCL 4 MG/2ML IJ SOLN
4.0000 mg | Freq: Four times a day (QID) | INTRAMUSCULAR | Status: DC | PRN
Start: 2021-05-11 — End: 2021-05-12

## 2021-05-11 MED ORDER — SODIUM CHLORIDE 0.9% FLUSH: 3.0000 mL | INTRAVENOUS | Status: DC | PRN

## 2021-05-11 MED ORDER — SODIUM CHLORIDE 0.9% FLUSH
3.0000 mL | Freq: Two times a day (BID) | INTRAVENOUS | Status: DC
  Administered 2021-05-13: 3 mL via INTRAVENOUS

## 2021-05-11 MED ORDER — SODIUM CHLORIDE 0.9 % IV SOLN: INTRAVENOUS | Status: AC

## 2021-05-11 MED ORDER — SODIUM CHLORIDE 0.9% FLUSH
3.0000 mL | INTRAVENOUS | Status: DC | PRN
Start: 2021-05-11 — End: 2021-05-11

## 2021-05-11 MED ORDER — HEPARIN (PORCINE) IN NACL 1000-0.9 UT/500ML-% IV SOLN
INTRAVENOUS | Status: DC | PRN
  Administered 2021-05-11 (×2): 500 mL

## 2021-05-11 SURGICAL SUPPLY — 10 items
CATH 5FR JL3.5 JR4 ANG PIG MP (CATHETERS) ×2 IMPLANT
CATH BALLN WEDGE 5F 110CM (CATHETERS) ×2 IMPLANT
DEVICE RAD COMP TR BAND LRG (VASCULAR PRODUCTS) ×2 IMPLANT
GLIDESHEATH SLEND SS 6F .021 (SHEATH) ×2 IMPLANT
GUIDEWIRE INQWIRE 1.5J.035X260 (WIRE) ×1 IMPLANT
INQWIRE 1.5J .035X260CM (WIRE) ×2
MAT PREVALON FULL STRYKER (MISCELLANEOUS) ×2 IMPLANT
PACK CARDIAC CATHETERIZATION (CUSTOM PROCEDURE TRAY) ×2 IMPLANT
SHEATH GLIDE SLENDER 4/5FR (SHEATH) ×2 IMPLANT
TRANSDUCER W/STOPCOCK (MISCELLANEOUS) ×2 IMPLANT

## 2021-05-11 NOTE — Discharge Instructions (Addendum)

## 2021-05-11 NOTE — Progress Notes (Signed)
Physical Therapy Treatment Patient Details Name: Christine Oneal MRN: [id number] DOB: [birth year] Today's Date: 05/11/2021   History of Present Illness The pt is a 50 yo female presenting 77 with c/o SOB x3 weeks, found to be in afib with RVR upon arrival of EMS. S/p L/R heart cath 10/31, plan for TEE guided cardioversion 11/1. PMH includes: HTN, obesity, and plantar fasciitis.    PT Comments    The pt was able to make good progress with OOB mobility at this time, completing increased distance hallway ambulation with less standing rest breaks and less HR elevation than prior session. The pt contineus to demo good ability to self-monitor for exertion as well as good dynamic stability with gait and stairs. Continue to recommend skilled PT acutely to progress independence, will trial rollator for improved safety and availability of rest for longer ambulation.    Recommendations for follow up therapy are one component of a multi-disciplinary discharge planning process, led by the attending physician.  Recommendations may be updated based on patient status, additional functional criteria and insurance authorization.  Follow Up Recommendations  Home health PT     Assistance Recommended at Discharge Intermittent Supervision/Assistance  Equipment Recommendations  Rollator (4 wheels);Other (comment) (shower chait)    Recommendations for Other Services       Precautions / Restrictions Precautions Precautions: Other (comment) Precaution Comments: watch HR Restrictions Weight Bearing Restrictions: No     Mobility  Bed Mobility Overal bed mobility: Needs Assistance Bed Mobility: Supine to Sit     Supine to sit: Supervision     General bed mobility comments: increased time and use of bed rails    Transfers Overall transfer level: Needs assistance Equipment used: None Transfers: Sit to/from Stand Sit to Stand: Supervision           General transfer comment: completed multiple  times from sufaces of varying heights no assist    Ambulation/Gait Ambulation/Gait assistance: Supervision Gait Distance (Feet): 175 Feet Assistive device: None Gait Pattern/deviations: Step-to pattern;Decreased stride length;Wide base of support Gait velocity: 0.2 m/s Gait velocity interpretation: <1.31 ft/sec, indicative of household ambulator General Gait Details: pt with small steps but improved pace and decreased need for standing rest breaks at this time.   Stairs Stairs: Yes Stairs assistance: Min guard Stair Management: One rail Left;Step to pattern;Forwards Number of Stairs: 2 (x2) General stair comments: HR elevation to 135 bpm      Balance Overall balance assessment: Mild deficits observed, not formally tested                                          Cognition Arousal/Alertness: Awake/alert Behavior During Therapy: WFL for tasks assessed/performed Overall Cognitive Status: Within Functional Limits for tasks assessed                                 General Comments: slightly decreased awareness as pt frequently urinating and soaking the surface she is sitting on, but does not call for RN or to get cleaned up.        Exercises      General Comments General comments (skin integrity, edema, etc.): HR elevation to 138bpm in session, mostly 110s-120s with gait      Pertinent Vitals/Pain Pain Assessment: No/denies pain     PT Goals (current goals can now be  found in the care plan section) Acute Rehab PT Goals Patient Stated Goal: return home PT Goal Formulation: With patient Time For Goal Achievement: 05/23/21 Potential to Achieve Goals: Good Progress towards PT goals: Progressing toward goals    Frequency    Min 3X/week      PT Plan Current plan remains appropriate       AM-PAC PT "6 Clicks" Mobility   Outcome Measure  Help needed turning from your back to your side while in a flat bed without using bedrails?:  None Help needed moving from lying on your back to sitting on the side of a flat bed without using bedrails?: None Help needed moving to and from a bed to a chair (including a wheelchair)?: A Little Help needed standing up from a chair using your arms (e.g., wheelchair or bedside chair)?: A Little Help needed to walk in hospital room?: A Little Help needed climbing 3-5 steps with a railing? : A Little 6 Click Score: 20    End of Session Equipment Utilized During Treatment: Gait belt Activity Tolerance: Patient tolerated treatment well Patient left: with call bell/phone within reach;in bed Nurse Communication: Mobility status PT Visit Diagnosis: Other abnormalities of gait and mobility (R26.89);Muscle weakness (generalized) (M62.81)     Time: 1610-9604 PT Time Calculation (min) (ACUTE ONLY): 38 min  Charges:  $Gait Training: 23-37 mins $Therapeutic Activity: 8-22 mins                     West Carbo, PT, DPT   Acute Rehabilitation Department Pager #: [phone]   Sandra Cockayne 05/11/2021, 6:18 PM

## 2021-05-11 NOTE — H&P (View-Only) (Signed)
Advanced Heart Failure Rounding Note  PCP-Cardiologist: None   Subjective:    Diuresed well over the weekend. Weight down 27 pounds total.   Co-ox 65% on milrinone 0.125  Breathing better. Denies CP, SOB, orthopnea.   Stil in AF. Rate 90s on IV amio and heparin.   Objective:   Weight Range: 132.2 kg Body mass index is 51.63 kg/m.   Vital Signs:   Temp:  [97.9 F (36.6 C)-98.7 F (37.1 C)] 97.9 F (36.6 C) (10/31 0528) Pulse Rate:  [80-99] 82 (10/31 0528) Resp:  [19-20] 19 (10/31 0528) BP: (105-124)/(74-85) 105/85 (10/31 0528) SpO2:  [93 %-98 %] 98 % (10/31 0735) Weight:  [132.2 kg] 132.2 kg (10/31 0528) Last BM Date: 05/10/21  Weight change: Filed Weights   05/09/21 0500 05/10/21 0450 05/11/21 0528  Weight: (!) 136.3 kg 134 kg 132.2 kg    Intake/Output:   Intake/Output Summary (Last 24 hours) at 05/11/2021 0738 Last data filed at 05/11/2021 0646 Gross per 24 hour  Intake 1321.85 ml  Output 2800 ml  Net -1478.15 ml       Physical Exam   General:  Lying in bed No resp difficulty HEENT: normal Neck: supple. JVP 9 Carotids 2+ bilat; no bruits. No lymphadenopathy or thryomegaly appreciated. Cor: PMI nondisplaced. Irregular rate & rhythm. No rubs, gallops or murmurs. Lungs: clear Abdomen: obese soft, nontender, nondistended. No hepatosplenomegaly. No bruits or masses. Good bowel sounds. Extremities: no cyanosis, clubbing, rash, trace edema Neuro: alert & orientedx3, cranial nerves grossly intact. moves all 4 extremities w/o difficulty. Affect pleasant    Telemetry    A Fib 90-100 Personally reviewed   Labs    CBC Recent Labs    05/10/21 0400 05/11/21 0430  WBC 7.2 9.0  HGB 12.0 12.8  HCT 37.7 39.2  MCV 92.6 92.9  PLT 339 410*    Basic Metabolic Panel Recent Labs    05/10/21 0400 05/11/21 0430  NA 133* 134*  K 3.7 3.6  CL 97* 98  CO2 29 30  GLUCOSE 125* 133*  BUN 15 17  CREATININE 1.13* 1.16*  CALCIUM 11.0* 11.5*  MG 2.4  2.5*    Liver Function Tests No results for input(s): AST, ALT, ALKPHOS, BILITOT, PROT, ALBUMIN in the last 72 hours. No results for input(s): LIPASE, AMYLASE in the last 72 hours. Cardiac Enzymes No results for input(s): CKTOTAL, CKMB, CKMBINDEX, TROPONINI in the last 72 hours.  BNP: BNP (last 3 results) Recent Labs    05/03/21 2349  BNP 670.0*     ProBNP (last 3 results) No results for input(s): PROBNP in the last 8760 hours.   D-Dimer No results for input(s): DDIMER in the last 72 hours. Hemoglobin A1C No results for input(s): HGBA1C in the last 72 hours.  Fasting Lipid Panel No results for input(s): CHOL, HDL, LDLCALC, TRIG, CHOLHDL, LDLDIRECT in the last 72 hours. Thyroid Function Tests No results for input(s): TSH, T4TOTAL, T3FREE, THYROIDAB in the last 72 hours.  Invalid input(s): FREET3  Other results:   Imaging    No results found.   Medications:     Scheduled Medications:  [MAR Hold] Chlorhexidine Gluconate Cloth  6 each Topical Daily   [MAR Hold] digoxin  0.125 mg Oral Daily   [MAR Hold] furosemide  80 mg Intravenous BID   [MAR Hold] losartan  50 mg Oral Daily   [MAR Hold] sodium chloride flush  10-40 mL Intracatheter Q12H   sodium chloride flush  3 mL Intravenous Q12H   [  MAR Hold] spironolactone  25 mg Oral Daily    Infusions:  sodium chloride     sodium chloride 10 mL/hr at 05/11/21 0646   amiodarone 30 mg/hr (05/11/21 0646)   heparin Stopped (05/11/21 0731)   milrinone 0.125 mcg/kg/min (05/11/21 0646)    PRN Medications: sodium chloride, [MAR Hold] acetaminophen, [MAR Hold] albuterol, [MAR Hold] ALPRAZolam, [MAR Hold] benzonatate, [MAR Hold] docusate sodium, [MAR Hold] guaiFENesin, [MAR Hold] magnesium hydroxide, [MAR Hold] ondansetron (ZOFRAN) IV, [MAR Hold] sodium chloride flush, sodium chloride flush    Patient Profile   Christine Oneal is a 50 y.o. female with history of HTN and obesity admitted with atrial fibrillation with RVR and  acute systolic HF.   Assessment/Plan  Acute systolic HF with low output/cardiomyopathy: -Suspect etiology likely tachy mediated in setting of AF with RVR. Uncontrolled HTN may also be playing a role. Myocarditis also a consideration. However, troponin flat and just minimally elevated.   -Echo with LVEF < 20%, moderate LVH, moderately reduced RV, severe MR -Co-ox 65% on 0.125 milrinone - Continues to diurese on IV lasix and metolazone. R/L cath today to assess coronaries and hemodynamics. SCr 1.16  -Continue spiro to 25 mg daily. -Continue losartan 50 mg daily (no ARNI d/t hx angioedema with ACEi) - Continue digoxin 0.125 mg daily - Consider SGLT2i down the road.  --No BB d/t low output HF -Continue unna boots.    2. Atrial fibrillation with RVR: -New diagnosis this admit, suspect duration for several weeks based on onset of symptoms -TSH WNL -CHADS2-VASc score at least 3 (female, HTN, CHF), on heparin gtt. Eventually switch to eliquis -Continue amio gtt. Rate controlled on IV amio  -Plan for TEE/DCCV  tomorrow - Will need formal sleep study as outpatient   3. Mitral regurgitation: -Severe on echo -Suspect functional   4. HTN: -Stable.    5. Morbid obesity: -Body mass index is 51.63 kg/m.  6. Hypokalemia: -K 3.6  -Mag 2. Stable.  -Continue spiro to 25 mg daily   7. Possible LV thrombus: -Continue heparin drip.    Length of Stay: [place], MD  05/11/2021, 7:38 AM  Advanced Heart Failure Team Pager [phone] (M-F; 7a - 5p)  Please contact [facility] for night-coverage after hours (5p -7a ) and weekends on [email or web address]

## 2021-05-11 NOTE — Interval H&P Note (Signed)
History and Physical Interval Note:  05/11/2021 7:49 AM  Christine Oneal  has presented today for surgery, with the diagnosis of CHF.  The various methods of treatment have been discussed with the patient and family. After consideration of risks, benefits and other options for treatment, the patient has consented to  Procedure(s): RIGHT/LEFT HEART CATH AND CORONARY ANGIOGRAPHY (N/A) and possible coronary angioplasty as a surgical intervention.  The patient's history has been reviewed, patient examined, no change in status, stable for surgery.  I have reviewed the patient's chart and labs.  Questions were answered to the patient's satisfaction.     [name]

## 2021-05-11 NOTE — Progress Notes (Signed)
TR band deflation completed, TR Band removed tolerated well. No bleeding noted Continue to monitor for  any sign of bleeding.

## 2021-05-11 NOTE — Progress Notes (Signed)
Advanced Heart Failure Rounding Note  PCP-Cardiologist: None   Subjective:    Diuresed well over the weekend. Weight down 27 pounds total.   Co-ox 65% on milrinone 0.125  Breathing better. Denies CP, SOB, orthopnea.   Stil in AF. Rate 90s on IV amio and heparin.   Objective:   Weight Range: 132.2 kg Body mass index is 51.63 kg/m.   Vital Signs:   Temp:  [97.9 F (36.6 C)-98.7 F (37.1 C)] 97.9 F (36.6 C) (10/31 0528) Pulse Rate:  [80-99] 82 (10/31 0528) Resp:  [19-20] 19 (10/31 0528) BP: (105-124)/(74-85) 105/85 (10/31 0528) SpO2:  [93 %-98 %] 98 % (10/31 0735) Weight:  [132.2 kg] 132.2 kg (10/31 0528) Last BM Date: 05/10/21  Weight change: Filed Weights   05/09/21 0500 05/10/21 0450 05/11/21 0528  Weight: (!) 136.3 kg 134 kg 132.2 kg    Intake/Output:   Intake/Output Summary (Last 24 hours) at 05/11/2021 0738 Last data filed at 05/11/2021 0646 Gross per 24 hour  Intake 1321.85 ml  Output 2800 ml  Net -1478.15 ml       Physical Exam   General:  Lying in bed No resp difficulty HEENT: normal Neck: supple. JVP 9 Carotids 2+ bilat; no bruits. No lymphadenopathy or thryomegaly appreciated. Cor: PMI nondisplaced. Irregular rate & rhythm. No rubs, gallops or murmurs. Lungs: clear Abdomen: obese soft, nontender, nondistended. No hepatosplenomegaly. No bruits or masses. Good bowel sounds. Extremities: no cyanosis, clubbing, rash, trace edema Neuro: alert & orientedx3, cranial nerves grossly intact. moves all 4 extremities w/o difficulty. Affect pleasant    Telemetry    A Fib 90-100 Personally reviewed   Labs    CBC Recent Labs    05/10/21 0400 05/11/21 0430  WBC 7.2 9.0  HGB 12.0 12.8  HCT 37.7 39.2  MCV 92.6 92.9  PLT 339 410*    Basic Metabolic Panel Recent Labs    05/10/21 0400 05/11/21 0430  NA 133* 134*  K 3.7 3.6  CL 97* 98  CO2 29 30  GLUCOSE 125* 133*  BUN 15 17  CREATININE 1.13* 1.16*  CALCIUM 11.0* 11.5*  MG 2.4  2.5*    Liver Function Tests No results for input(s): AST, ALT, ALKPHOS, BILITOT, PROT, ALBUMIN in the last 72 hours. No results for input(s): LIPASE, AMYLASE in the last 72 hours. Cardiac Enzymes No results for input(s): CKTOTAL, CKMB, CKMBINDEX, TROPONINI in the last 72 hours.  BNP: BNP (last 3 results) Recent Labs    05/03/21 2349  BNP 670.0*     ProBNP (last 3 results) No results for input(s): PROBNP in the last 8760 hours.   D-Dimer No results for input(s): DDIMER in the last 72 hours. Hemoglobin A1C No results for input(s): HGBA1C in the last 72 hours.  Fasting Lipid Panel No results for input(s): CHOL, HDL, LDLCALC, TRIG, CHOLHDL, LDLDIRECT in the last 72 hours. Thyroid Function Tests No results for input(s): TSH, T4TOTAL, T3FREE, THYROIDAB in the last 72 hours.  Invalid input(s): FREET3  Other results:   Imaging    No results found.   Medications:     Scheduled Medications:  [MAR Hold] Chlorhexidine Gluconate Cloth  6 each Topical Daily   [MAR Hold] digoxin  0.125 mg Oral Daily   [MAR Hold] furosemide  80 mg Intravenous BID   [MAR Hold] losartan  50 mg Oral Daily   [MAR Hold] sodium chloride flush  10-40 mL Intracatheter Q12H   sodium chloride flush  3 mL Intravenous Q12H   [  MAR Hold] spironolactone  25 mg Oral Daily    Infusions:  sodium chloride     sodium chloride 10 mL/hr at 05/11/21 0646   amiodarone 30 mg/hr (05/11/21 0646)   heparin Stopped (05/11/21 0731)   milrinone 0.125 mcg/kg/min (05/11/21 0646)    PRN Medications: sodium chloride, [MAR Hold] acetaminophen, [MAR Hold] albuterol, [MAR Hold] ALPRAZolam, [MAR Hold] benzonatate, [MAR Hold] docusate sodium, [MAR Hold] guaiFENesin, [MAR Hold] magnesium hydroxide, [MAR Hold] ondansetron (ZOFRAN) IV, [MAR Hold] sodium chloride flush, sodium chloride flush    Patient Profile   Christine Oneal is a 50 y.o. female with history of HTN and obesity admitted with atrial fibrillation with RVR and  acute systolic HF.   Assessment/Plan  Acute systolic HF with low output/cardiomyopathy: -Suspect etiology likely tachy mediated in setting of AF with RVR. Uncontrolled HTN may also be playing a role. Myocarditis also a consideration. However, troponin flat and just minimally elevated.   -Echo with LVEF < 20%, moderate LVH, moderately reduced RV, severe MR -Co-ox 65% on 0.125 milrinone - Continues to diurese on IV lasix and metolazone. R/L cath today to assess coronaries and hemodynamics. SCr 1.16  -Continue spiro to 25 mg daily. -Continue losartan 50 mg daily (no ARNI d/t hx angioedema with ACEi) - Continue digoxin 0.125 mg daily - Consider SGLT2i down the road.  --No BB d/t low output HF -Continue unna boots.    2. Atrial fibrillation with RVR: -New diagnosis this admit, suspect duration for several weeks based on onset of symptoms -TSH WNL -CHADS2-VASc score at least 3 (female, HTN, CHF), on heparin gtt. Eventually switch to eliquis -Continue amio gtt. Rate controlled on IV amio  -Plan for TEE/DCCV  tomorrow - Will need formal sleep study as outpatient   3. Mitral regurgitation: -Severe on echo -Suspect functional   4. HTN: -Stable.    5. Morbid obesity: -Body mass index is 51.63 kg/m.  6. Hypokalemia: -K 3.6  -Mag 2. Stable.  -Continue spiro to 25 mg daily   7. Possible LV thrombus: -Continue heparin drip.    Length of Stay: [place], MD  05/11/2021, 7:38 AM  Advanced Heart Failure Team Pager [phone] (M-F; 7a - 5p)  Please contact [facility] for night-coverage after hours (5p -7a ) and weekends on [email or web address]

## 2021-05-11 NOTE — Progress Notes (Signed)
[place] for Heparin > apixaban Indication: New onset atrial fibrillation  Allergies  Allergen Reactions   Lisinopril-Hydrochlorothiazide Anaphylaxis   Other Anaphylaxis   Lisinopril     Other reaction(s): Angioedema    Patient Measurements: Height: 5\' 3"  (160 cm) Weight: 132.2 kg (291 lb 7.2 oz) IBW/kg (Calculated) : 52.4 HEPARIN DW (KG): 89.2   Vital Signs: Temp: 98.1 F (36.7 C) (10/31 1108) Temp Source: Oral (10/31 1108) BP: 135/89 (10/31 1145) Pulse Rate: 107 (10/31 1220)  Labs: Recent Labs    05/09/21 0313 05/10/21 0400 05/10/21 1254 05/11/21 0430  HGB 10.8* 12.0  --  12.8  HCT 32.9* 37.7  --  39.2  PLT 371 339  --  410*  HEPARINUNFRC 0.54 0.74* 0.49 0.49  CREATININE 1.09* 1.13*  --  1.16*     Estimated Creatinine Clearance: 76.4 mL/min (A) (by C-G formula based on SCr of 1.16 mg/dL (H)).   Medical History: Past Medical History:  Diagnosis Date   Abnormal Pap smear of cervix    Allergy    Anemia    Fibroid    Hypertension    Leukocytosis, unspecified 10/18/2013   Obesity    Plantar fasciitis      Assessment: 50 y/o F with new onset atrial fibrillation on heparin. She is noted with acute HF on milrinone. S/p R/L HC volume status improved, coox 60 on low dose milrinone CI 2.5 - milrinone stopped post cath.  Plan TEE/DCCV tomorrow  Stop heparin post cath and begin apixaban  - plan 3 doses prior to TEE/DCCV  TR band off and No bleeding noted  Cr < 1.5, wt > 60kg age < 80  Goal of Therapy:   Monitor platelets by anticoagulation protocol: Yes   Plan:  Stop heparin  Apixaban 5mg  BID   Bonnita Nasuti Pharm.D. CPP, BCPS Clinical Pharmacist [phone] 05/11/2021 1:26 PM

## 2021-05-11 NOTE — Progress Notes (Signed)
Taken to the cath lab by bed during shift change.

## 2021-05-11 NOTE — Discharge Summary (Addendum)
Advanced Heart Failure Team  Discharge Summary   Patient ID: Christine Oneal MRN: [id number], DOB/AGE: 51-Sep-1972 51 y.o. Admit date: 05/03/2021 D/C date:     05/14/2021   Primary Discharge Diagnoses:  Acute systolic heart failure Nonischemic cardiomyopathy Atrial fibrillation with RVR Mitral regurgitation HTN Morbid obesity Hypokalemia LV thrombus  Hospital Course: [name] is a 50 y.o. female with history of HTN and obesity with BMI of 55.    Presented on 10/23 with complaints of shortness of shortness of breath and cough for at least 3 weeks. Recently treated for suspected upper respiratory infection with antibiotics after a telemedicine visit on 10/19. Found to be in afib with rate 150s. She was initiated on heparin drip and IV amiodarone. BNP 670. Troponin flat, 48 > 51. Chest x-ray with evidence of CHF and small bilateral pleural effusions.   She was admitted under Cardiology service and AHF consulted. D/t concern for low-output HF she was started on IV milrinone. Echo with LVEF < 20%, moderate LVH, moderately reduced RV, severe MR. 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 R/LHC 10/31 with normal coronary arteries, RA 2, PA 31/8, Fick CI 2.5, PA sat 67%Diuresed with IV lasix then transitioned to po torsemide, weight down total of 31 lb. D/C weight 287 lb.  Initiated GDMT with spiro, losartan, imdur/hydralazine (no bidil d/t cost), farxiga and digoxin.     Unsuccessful DCCV despite three shocks on 11/01. IV amio switched to po 200 mg BID. Anticoagulated with Eliquis.   Day of discharge Scr bumped from 1 > 1.5, CVP 1. Given 500 cc bolus of NS. Torsemide held, restarting 11/05 at 20 mg daily.   Seen by PT/OT. Qualifed for HH/PT which was arranged.     Discharge Vitals: Blood pressure 117/80, pulse 87, temperature 98.8 F (37.1 C), temperature source Oral, resp. rate 20, height 5\' 3"  (1.6 m), weight 130.6 kg, last menstrual period  05/01/2021, SpO2 96 %.  Labs: Lab Results  Component Value Date   WBC 8.9 05/14/2021   HGB 11.6 (L) 05/14/2021   HCT 36.5 05/14/2021   MCV 93.4 05/14/2021   PLT 385 05/14/2021    Recent Labs  Lab 05/14/21 0400  NA 136  K 3.7  CL 105  CO2 26  BUN 25*  CREATININE 1.50*  CALCIUM 11.7*  GLUCOSE 125*   Lab Results  Component Value Date   CHOL 202 (H) 02/05/2020   HDL 46 02/05/2020   LDLCALC 139 (H) 02/05/2020   TRIG 93 02/05/2020   BNP (last 3 results) Recent Labs    05/03/21 2349  BNP 670.0*    ProBNP (last 3 results) No results for input(s): PROBNP in the last 8760 hours.   Diagnostic Studies/Procedures   ECHO TEE  Result Date: 05/12/2021    TRANSESOPHOGEAL ECHO REPORT   Patient Name:   Christine Oneal Date of Exam: 05/12/2021 Medical Rec #:  [id number]        Height:       63.0 in Accession #:    [id number]       Weight:       293.9 lb Date of Birth:  September 03, 1970         BSA:          2.277 m Patient Age:    48 years         BP:           114/79 mmHg Patient Gender: F  HR:           94 bpm. Exam Location:  Inpatient Procedure: Transesophageal Echo, Cardiac Doppler and Color Doppler Indications:     Atrial fibrillation  History:         Patient has prior history of Echocardiogram examinations, most                  recent 05/04/2021. CHF, Arrythmias:Atrial Fibrillation; Risk                  Factors:Hypertension and Morbid obesity.  Sonographer:     Dustin Flock RDCS Referring Phys:  [place] Diagnosing Phys: Glori Bickers MD PROCEDURE: The transesophogeal probe was passed without difficulty through the esophogus of the patient. Sedation performed by different physician. The patient was monitored while under deep sedation. Anesthestetic sedation was provided intravenously by Anesthesiology: 226.62mg  of Propofol, 50mg  of Lidocaine. The patient developed no complications during the procedure. IMPRESSIONS  1. Left ventricular ejection  fraction, by estimation, is 25 to 30%. The left ventricle has severely decreased function. The left ventricle demonstrates global hypokinesis.  2. Right ventricular systolic function is normal. The right ventricular size is normal.  3. Left atrial size was moderately dilated. No left atrial/left atrial appendage thrombus was detected.  4. Right atrial size was moderately dilated.  5. The mitral valve is normal in structure. Mild mitral valve regurgitation.  6. The aortic valve is normal in structure. Aortic valve regurgitation is trivial.  7. There is mild (Grade II) plaque involving the descending aorta.  8. Evidence of atrial level shunting detected by color flow Doppler. There is a small patent foramen ovale. FINDINGS  Left Ventricle: Left ventricular ejection fraction, by estimation, is 25 to 30%. The left ventricle has severely decreased function. The left ventricle demonstrates global hypokinesis. The left ventricular internal cavity size was normal in size. Right Ventricle: The right ventricular size is normal. No increase in right ventricular wall thickness. Right ventricular systolic function is normal. Left Atrium: Left atrial size was moderately dilated. No left atrial/left atrial appendage thrombus was detected. Right Atrium: Right atrial size was moderately dilated. Pericardium: There is no evidence of pericardial effusion. Mitral Valve: The mitral valve is normal in structure. Mild mitral valve regurgitation. Tricuspid Valve: The tricuspid valve is normal in structure. Tricuspid valve regurgitation is mild. Aortic Valve: The aortic valve is normal in structure. Aortic valve regurgitation is trivial. Pulmonic Valve: The pulmonic valve was normal in structure. Pulmonic valve regurgitation is trivial. Aorta: The aortic root is normal in size and structure. There is mild (Grade II) plaque involving the descending aorta. IAS/Shunts: Evidence of atrial level shunting detected by color flow Doppler. A small  patent foramen ovale is detected. Glori Bickers MD Electronically signed by Glori Bickers MD Signature Date/Time: 05/12/2021/2:54:34 PM    Final     Discharge Medications   Allergies as of 05/14/2021       Reactions   Lisinopril-hydrochlorothiazide Anaphylaxis   Other Anaphylaxis        Medication List     STOP taking these medications    albuterol 108 (90 Base) MCG/ACT inhaler Commonly known as: VENTOLIN HFA   amLODipine 10 MG tablet Commonly known as: NORVASC   atenolol 50 MG tablet Commonly known as: TENORMIN   azithromycin 250 MG tablet Commonly known as: Zithromax Z-Pak   benzonatate 100 MG capsule Commonly known as: Tessalon Perles   guaiFENesin 600 MG 12 hr tablet Commonly known as: [employer]  metoprolol succinate 50 MG 24 hr tablet Commonly known as: TOPROL-XL   predniSONE 10 MG (21) Tbpk tablet Commonly known as: STERAPRED UNI-PAK 21 TAB       TAKE these medications    amiodarone 200 MG tablet Commonly known as: PACERONE Take 1 tablet (200 mg total) by mouth 2 (two) times daily.   apixaban 5 MG Tabs tablet Commonly known as: ELIQUIS Take 1 tablet (5 mg total) by mouth 2 (two) times daily.   dapagliflozin propanediol 10 MG Tabs tablet Commonly known as: FARXIGA Take 1 tablet (10 mg total) by mouth daily. Start taking on: May 15, 2021   digoxin 0.125 MG tablet Commonly known as: LANOXIN Take 1 tablet (0.125 mg total) by mouth daily. Start taking on: May 15, 2021   hydrALAZINE 25 MG tablet Commonly known as: APRESOLINE Take 1.5 tablets (37.5 mg total) by mouth every 8 (eight) hours.   isosorbide mononitrate 60 MG 24 hr tablet Commonly known as: IMDUR Take 1 tablet (60 mg total) by mouth daily. Start taking on: May 15, 2021   losartan 100 MG tablet Commonly known as: COZAAR Take 1 tablet (100 mg total) by mouth daily. Start taking on: May 15, 2021   potassium chloride SA 20 MEQ tablet Commonly known as:  KLOR-CON Take 2 tablets (40 mEq total) by mouth daily. Start taking on: May 15, 2021   Probiotic Chew Chew 2 capsules by mouth daily.   spironolactone 25 MG tablet Commonly known as: ALDACTONE Take 1 tablet (25 mg total) by mouth daily. Start taking on: May 15, 2021   torsemide 20 MG tablet Commonly known as: Demadex Take 1 tablet (20 mg total) by mouth daily. Start taking on: May 16, 2021               Durable Medical Equipment  (From admission, onward)           Start     Ordered   05/13/21 1143  For home use only DME 4 wheeled rolling walker with seat  Once       Question:  Patient needs a walker to treat with the following condition  Answer:  A-fib [facility])   05/13/21 1142   05/13/21 0853  For home use only DME Tub bench  Once        05/13/21 8119   05/13/21 0852  For home use only DME Walker rolling  Once       Question Answer Comment  Walker: With [place]   Patient needs a walker to treat with the following condition A-fib [facility])      05/13/21 0852   05/12/21 1406  Heart failure home health orders  (Heart failure home health orders / Face to face)  Once       Comments: Heart Failure Follow-up Care:  Verify follow-up appointments per Patient Discharge Instructions. Confirm transportation arranged. Reconcile home medications with discharge medication list. Remove discontinued medications from use. Assist patient/caregiver to manage medications using pill box. Reinforce low sodium food selection Assessments: Vital signs and oxygen saturation at each visit. Assess home environment for safety concerns, caregiver support and availability of low-sodium foods. Consult Education officer, museum, PT/OT, Dietitian, and CNA based on assessments. Perform comprehensive cardiopulmonary assessment. Notify MD for any change in condition or weight gain of 3 pounds in one day or 5 pounds in one week with symptoms. Daily Weights and Symptom Monitoring: Ensure patient has  access to scales. Teach patient/caregiver to weigh daily before breakfast and  after voiding using same scale and record.    Teach patient/caregiver to track weight and symptoms and when to notify Provider. Activity: Develop individualized activity plan with patient/caregiver.   Question Answer Comment  Heart Failure Follow-up Care Advanced Heart Failure (AHF) Clinic at [place] Visits Set up telemonitoring equipment to monitor daily vital signs, weights and oxygen saturation   Obtain the following labs Basic Metabolic Panel   Lab frequency Weekly   Fax lab results to AHF Clinic at [phone]   Diet Low Sodium Heart Healthy   Fluid restrictions: 1500 mL Fluid   Skilled Nurse to notify MD of weight trends weekly for first 2 weeks. May fax or call: AHF Clinic at [phone] (fax) or [phone]      05/12/21 1407            Disposition   The patient will be discharged in stable condition to home. Discharge Instructions     (HEART FAILURE PATIENTS) Call MD:  Anytime you have any of the following symptoms: 1) 3 pound weight gain in 24 hours or 5 pounds in 1 week 2) shortness of breath, with or without a dry hacking cough 3) swelling in the hands, feet or stomach 4) if you have to sleep on extra pillows at night in order to breathe.   Complete by: As directed    Amb referral to AFIB Clinic   Complete by: As directed    Diet - low sodium heart healthy   Complete by: As directed    Heart Failure patients record your daily weight using the same scale at the same time of day   Complete by: As directed    Increase activity slowly   Complete by: As directed    STOP any activity that causes chest pain, shortness of breath, dizziness, sweating, or exessive weakness   Complete by: As directed        Follow-up Information     [place] Follow up on 05/19/2021.   Specialty: Cardiology Why: Advanced Heart Failure Clinic at [facility] 10 am Entrance C, [address] Contact information: [address] [phone] [place] Follow up.   Why: Home Health RN, Physical Therapy-agency will call to arrange visits Contact information: [phone]                  Duration of Discharge Encounter: Greater than 35 minutes   Signed, [facility], LINDSAY N  05/14/2021, 11:14 AM   Patient seen and examined with the above-signed Advanced Practice Provider and/or Housestaff. I personally reviewed laboratory data, imaging studies and relevant notes. I independently examined the patient and formulated the important aspects of the plan. I have edited the note to reflect any of my changes or salient points. I have personally discussed the plan with the patient and/or family.  [place] for d/c today. See my rounding note for further details.   Glori Bickers, MD  1:34 PM

## 2021-05-11 NOTE — Progress Notes (Signed)
Back from the cath lab by bed awake and alert. TR Band to right wrist in placed. Instructed to avoid movement to the affected arm, elevated with pillow and  pulse ox to right thumb.

## 2021-05-11 NOTE — Plan of Care (Signed)

## 2021-05-11 NOTE — Progress Notes (Signed)
Unna boots dressing removed. Awaiting ortho tech to reapply.

## 2021-05-11 NOTE — Progress Notes (Signed)
Unna boots reapplied by ortho tech.

## 2021-05-12 ENCOUNTER — Encounter (HOSPITAL_COMMUNITY): Payer: Self-pay | Admitting: Internal Medicine

## 2021-05-12 ENCOUNTER — Inpatient Hospital Stay (HOSPITAL_COMMUNITY): Payer: 59 | Admitting: Certified Registered Nurse Anesthetist

## 2021-05-12 ENCOUNTER — Encounter (HOSPITAL_COMMUNITY)
Admission: EM | Disposition: A | Discharge status: Home Health | Payer: Self-pay | Source: Home / Self Care | Attending: Internal Medicine

## 2021-05-12 ENCOUNTER — Inpatient Hospital Stay (HOSPITAL_COMMUNITY): Payer: 59

## 2021-05-12 DIAGNOSIS — I340 Nonrheumatic mitral (valve) insufficiency: Secondary | ICD-10-CM

## 2021-05-12 DIAGNOSIS — Q2112 Patent foramen ovale: Secondary | ICD-10-CM

## 2021-05-12 HISTORY — PX: TEE WITHOUT CARDIOVERSION: SHX5443

## 2021-05-12 HISTORY — PX: CARDIOVERSION: SHX1299

## 2021-05-12 LAB — POCT I-STAT EG7
Acid-Base Excess: 0 mmol/L (ref 0.0–2.0)
Acid-Base Excess: 1 mmol/L (ref 0.0–2.0)
Acid-Base Excess: 5 mmol/L — ABNORMAL HIGH (ref 0.0–2.0)
Bicarbonate: 25 mmol/L (ref 20.0–28.0)
Bicarbonate: 26.1 mmol/L (ref 20.0–28.0)
Bicarbonate: 30.3 mmol/L — ABNORMAL HIGH (ref 20.0–28.0)
Calcium, Ion: 1.05 mmol/L — ABNORMAL LOW (ref 1.15–1.40)
Calcium, Ion: 1.12 mmol/L — ABNORMAL LOW (ref 1.15–1.40)
Calcium, Ion: 1.37 mmol/L (ref 1.15–1.40)
HCT: 35 % — ABNORMAL LOW (ref 36.0–46.0)
HCT: 34 % — ABNORMAL LOW (ref 36.0–46.0)
HCT: 38 % (ref 36.0–46.0)
Hemoglobin: 11.9 g/dL — ABNORMAL LOW (ref 12.0–15.0)
Hemoglobin: 11.6 g/dL — ABNORMAL LOW (ref 12.0–15.0)
Hemoglobin: 12.9 g/dL (ref 12.0–15.0)
O2 Saturation: 67 %
O2 Saturation: 69 %
O2 Saturation: 78 %
Potassium: 2.7 mmol/L — CL (ref 3.5–5.1)
Potassium: 2.9 mmol/L — ABNORMAL LOW (ref 3.5–5.1)
Potassium: 3.4 mmol/L — ABNORMAL LOW (ref 3.5–5.1)
Sodium: 141 mmol/L (ref 135–145)
Sodium: 137 mmol/L (ref 135–145)
Sodium: 140 mmol/L (ref 135–145)
TCO2: 26 mmol/L (ref 22–32)
TCO2: 27 mmol/L (ref 22–32)
TCO2: 32 mmol/L (ref 22–32)
pCO2, Ven: 40.5 mmHg — ABNORMAL LOW (ref 44.0–60.0)
pCO2, Ven: 41.5 mmHg — ABNORMAL LOW (ref 44.0–60.0)
pCO2, Ven: 46.5 mmHg (ref 44.0–60.0)
pH, Ven: 7.399 (ref 7.250–7.430)
pH, Ven: 7.406 (ref 7.250–7.430)
pH, Ven: 7.422 (ref 7.250–7.430)
pO2, Ven: 35 mmHg (ref 32.0–45.0)
pO2, Ven: 36 mmHg (ref 32.0–45.0)
pO2, Ven: 42 mmHg (ref 32.0–45.0)

## 2021-05-12 LAB — POCT I-STAT 7, (LYTES, BLD GAS, ICA,H+H)
Acid-Base Excess: 4 mmol/L — ABNORMAL HIGH (ref 0.0–2.0)
Bicarbonate: 28.4 mmol/L — ABNORMAL HIGH (ref 20.0–28.0)
Calcium, Ion: 1.37 mmol/L (ref 1.15–1.40)
HCT: 38 % (ref 36.0–46.0)
Hemoglobin: 12.9 g/dL (ref 12.0–15.0)
O2 Saturation: 99 %
Potassium: 3.3 mmol/L — ABNORMAL LOW (ref 3.5–5.1)
Sodium: 139 mmol/L (ref 135–145)
TCO2: 30 mmol/L (ref 22–32)
pCO2 arterial: 41.4 mmHg (ref 32.0–48.0)
pH, Arterial: 7.444 (ref 7.350–7.450)
pO2, Arterial: 125 mmHg — ABNORMAL HIGH (ref 83.0–108.0)

## 2021-05-12 LAB — CBC
HCT: 38.4 % (ref 36.0–46.0)
Hemoglobin: 12.3 g/dL (ref 12.0–15.0)
MCH: 29.7 pg (ref 26.0–34.0)
MCHC: 32 g/dL (ref 30.0–36.0)
MCV: 92.8 fL (ref 80.0–100.0)
Platelets: 394 10*3/uL (ref 150–400)
RBC: 4.14 MIL/uL (ref 3.87–5.11)
RDW: 14.5 % (ref 11.5–15.5)
WBC: 7.6 10*3/uL (ref 4.0–10.5)
nRBC: 0 % (ref 0.0–0.2)

## 2021-05-12 LAB — COOXEMETRY PANEL
Carboxyhemoglobin: 1.4 % (ref 0.5–1.5)
Methemoglobin: 0.7 % (ref 0.0–1.5)
O2 Saturation: 63 %
Total hemoglobin: 11.4 g/dL — ABNORMAL LOW (ref 12.0–16.0)

## 2021-05-12 LAB — BASIC METABOLIC PANEL
Anion gap: 4 — ABNORMAL LOW (ref 5–15)
BUN: 14 mg/dL (ref 6–20)
CO2: 29 mmol/L (ref 22–32)
Calcium: 11.1 mg/dL — ABNORMAL HIGH (ref 8.9–10.3)
Chloride: 101 mmol/L (ref 98–111)
Creatinine, Ser: 0.96 mg/dL (ref 0.44–1.00)
GFR, Estimated: >60 mL/min (ref >60)
Glucose, Bld: 146 mg/dL — ABNORMAL HIGH (ref 70–99)
Potassium: 3.5 mmol/L (ref 3.5–5.1)
Sodium: 134 mmol/L — ABNORMAL LOW (ref 135–145)

## 2021-05-12 LAB — MAGNESIUM: Magnesium: 2.2 mg/dL (ref 1.7–2.4)

## 2021-05-12 SURGERY — ECHOCARDIOGRAM, TRANSESOPHAGEAL
Anesthesia: General

## 2021-05-12 MED ORDER — TORSEMIDE 20 MG PO TABS
20.0000 mg | ORAL_TABLET | Freq: Every day | ORAL | Status: DC
  Administered 2021-05-13: 20 mg via ORAL
  Filled 2021-05-12: qty 1

## 2021-05-12 MED ORDER — PROPOFOL 500 MG/50ML IV EMUL
INTRAVENOUS | Status: DC | PRN
  Administered 2021-05-12: 75 ug/kg/min via INTRAVENOUS

## 2021-05-12 MED ORDER — LIDOCAINE 2% (20 MG/ML) 5 ML SYRINGE
INTRAMUSCULAR | Status: DC | PRN
  Administered 2021-05-12: 50 mg via INTRAVENOUS

## 2021-05-12 MED ORDER — POTASSIUM CHLORIDE 10 MEQ/100ML IV SOLN
10.0000 meq | INTRAVENOUS | Status: AC
  Administered 2021-05-12 (×4): 10 meq via INTRAVENOUS
  Filled 2021-05-12 (×4): qty 100

## 2021-05-12 MED ORDER — DEXMEDETOMIDINE (PRECEDEX) IN NS 20 MCG/5ML (4 MCG/ML) IV SYRINGE
PREFILLED_SYRINGE | INTRAVENOUS | Status: AC
  Filled 2021-05-12: qty 5

## 2021-05-12 MED ORDER — HYDROCORTISONE 1 % EX CREA
1.0000 "application " | TOPICAL_CREAM | Freq: Three times a day (TID) | CUTANEOUS | Status: DC | PRN
  Administered 2021-05-12 – 2021-05-13 (×3): 1 via TOPICAL
  Filled 2021-05-12: qty 28

## 2021-05-12 MED ORDER — PROPOFOL 10 MG/ML IV BOLUS
INTRAVENOUS | Status: DC | PRN
  Administered 2021-05-12: 20 mg via INTRAVENOUS

## 2021-05-12 MED ORDER — PHENYLEPHRINE 40 MCG/ML (10ML) SYRINGE FOR IV PUSH (FOR BLOOD PRESSURE SUPPORT)
PREFILLED_SYRINGE | INTRAVENOUS | Status: DC | PRN
  Administered 2021-05-12: 80 ug via INTRAVENOUS

## 2021-05-12 MED ORDER — INFLUENZA VAC SPLIT QUAD 0.5 ML IM SUSY
0.5000 mL | PREFILLED_SYRINGE | INTRAMUSCULAR | Status: AC
  Administered 2021-05-14: 0.5 mL via INTRAMUSCULAR
  Filled 2021-05-12: qty 0.5

## 2021-05-12 MED ORDER — SODIUM CHLORIDE 0.9 % IV SOLN: INTRAVENOUS | Status: DC | PRN

## 2021-05-12 MED ORDER — PNEUMOCOCCAL VAC POLYVALENT 25 MCG/0.5ML IJ INJ
0.5000 mL | INJECTION | INTRAMUSCULAR | Status: DC
  Filled 2021-05-12: qty 0.5

## 2021-05-12 MED ORDER — LOSARTAN POTASSIUM 50 MG PO TABS
100.0000 mg | ORAL_TABLET | Freq: Every day | ORAL | Status: DC
  Administered 2021-05-12 – 2021-05-14 (×3): 100 mg via ORAL
  Filled 2021-05-12 (×3): qty 2

## 2021-05-12 MED ORDER — MIDAZOLAM HCL 2 MG/2ML IJ SOLN
INTRAMUSCULAR | Status: AC
  Filled 2021-05-12: qty 2

## 2021-05-12 NOTE — Anesthesia Preprocedure Evaluation (Addendum)
Anesthesia Evaluation  Patient identified by MRN, date of birth, ID band Patient awake    Reviewed: Allergy & Precautions, NPO status , Patient's Chart, lab work & pertinent test results, reviewed documented beta blocker date and time   Airway Mallampati: I  TM Distance: >3 FB Neck ROM: Full    Dental  (+) Dental Advisory Given, Missing   Pulmonary asthma ,    Pulmonary exam normal breath sounds clear to auscultation       Cardiovascular hypertension, Pt. on home beta blockers and Pt. on medications +CHF  + dysrhythmias Atrial Fibrillation  Rhythm:Irregular Rate:Abnormal  Echo 05/04/21: 1. With Definity contrast, the very large papilarry muscles are  visualized. The apex is akinetic. There is possible early thrombus  formmation in the apex. . Left ventricular ejection fraction, by  estimation, is <20%. The left ventricle has severely  decreased function. The left ventricle demonstrates global hypokinesis.  The left ventricular internal cavity size was moderately dilated. There is  moderate concentric left ventricular hypertrophy. Left ventricular  diastolic function could not be  evaluated.  2. Right ventricular systolic function is moderately reduced. The right  ventricular size is moderately enlarged.  3. Left atrial size was mildly dilated.  4. Right atrial size was mildly dilated.  5. The mitral valve is grossly normal. Severe mitral valve regurgitation.  6. Tricuspid valve regurgitation is mild to moderate.  7. The aortic valve is grossly normal. Aortic valve regurgitation is  mild. No aortic stenosis is present.    Neuro/Psych negative neurological ROS     GI/Hepatic negative GI ROS, Neg liver ROS,   Endo/Other  Morbid obesity (BMI 52)  Renal/GU negative Renal ROS     Musculoskeletal negative musculoskeletal ROS (+)   Abdominal   Peds  Hematology  (+) Blood dyscrasia, anemia ,   Anesthesia Other  Findings Day of surgery medications reviewed with the patient.  Reproductive/Obstetrics                            Anesthesia Physical Anesthesia Plan  ASA: 4  Anesthesia Plan: General   Post-op Pain Management:    Induction: Intravenous  PONV Risk Score and Plan: 3 and Propofol infusion and Treatment may vary due to age or medical condition  Airway Management Planned: Nasal Cannula and Natural Airway  Additional Equipment:   Intra-op Plan:   Post-operative Plan:   Informed Consent: I have reviewed the patients History and Physical, chart, labs and discussed the procedure including the risks, benefits and alternatives for the proposed anesthesia with the patient or authorized representative who has indicated his/her understanding and acceptance.     Dental advisory given  Plan Discussed with: CRNA  Anesthesia Plan Comments:        Anesthesia Quick Evaluation

## 2021-05-12 NOTE — Progress Notes (Signed)
  Echocardiogram Echocardiogram Transesophageal has been performed.  Christine Oneal 05/12/2021, 2:31 PM

## 2021-05-12 NOTE — H&P (View-Only) (Signed)
Advanced Heart Failure Rounding Note  PCP-Cardiologist: None   Subjective:    IV lasix stopped 10/31. Diuresed 25 lb this admit.  Milrinone off. Co-ox stable at 63%.  Feeling well today. No dyspnea at rest. Anxious about cardioversion.  Stil in AF. Rate 80s on IV amio. Heparin gtt  R/LHC, 10/31 Findings:   Ao = 156/89 (112) LV =135/7 RA = 2 RV = 27/6 PA = 31/8 (20) PCW = 7 Fick cardiac output/index = 5.6/2.5 PVR = 2.2 WU FA sat = 99% PA sat = 67%. 69% SVC sat = 72%   Assessment: 1. Normal coronary arteries 2. 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 Well-compensated hemodynamics after large-volume diuresis  Objective:   Weight Range: 133.3 kg Body mass index is 52.06 kg/m.   Vital Signs:   Temp:  [97.8 F (36.6 C)-99 F (37.2 C)] 97.9 F (36.6 C) (11/01 0736) Pulse Rate:  [79-107] 88 (11/01 0736) Resp:  [17-24] 19 (11/01 0736) BP: (126-165)/(68-104) 151/86 (11/01 0736) SpO2:  [95 %-99 %] 96 % (11/01 0736) Weight:  [133.3 kg] 133.3 kg (11/01 0505) Last BM Date: 05/12/21  Weight change: Filed Weights   05/10/21 0450 05/11/21 0528 05/12/21 0505  Weight: 134 kg 132.2 kg 133.3 kg    Intake/Output:   Intake/Output Summary (Last 24 hours) at 05/12/2021 0839 Last data filed at 05/12/2021 0715 Gross per 24 hour  Intake 912.96 ml  Output 650 ml  Net 262.96 ml      Physical Exam   General:  Lying in bed. No distress.  HEENT: normal Neck: supple. no JVD. Carotids 2+ bilat; no bruits. No lymphadenopathy or thryomegaly appreciated. Cor: PMI nondisplaced. Irregular rhythm.  No rubs, gallops or murmurs. Lungs: clear Abdomen: soft, nontender, nondistended. No hepatosplenomegaly. No bruits or masses. Good bowel sounds. Extremities: no cyanosis, clubbing, rash, trace edema, UNNA boots on Neuro: alert & orientedx3, cranial nerves grossly intact. moves all 4 extremities w/o difficulty. Affect pleasant     Telemetry   AF  80s-90s (personally reviewed)   Labs    CBC Recent Labs    05/11/21 0430 05/11/21 0800 05/11/21 0804 05/12/21 0427  WBC 9.0  --   --  7.6  HGB 12.8   < > 11.9* 12.3  HCT 39.2   < > 35.0* 38.4  MCV 92.9  --   --  92.8  PLT 410*  --   --  394   < > = values in this interval not displayed.   Basic Metabolic Panel Recent Labs    05/11/21 0430 05/11/21 0800 05/11/21 0804 05/12/21 0427  NA 134*   < > 141 134*  K 3.6   < > 2.7* 3.5  CL 98  --   --  101  CO2 30  --   --  29  GLUCOSE 133*  --   --  146*  BUN 17  --   --  14  CREATININE 1.16*  --   --  0.96  CALCIUM 11.5*  --   --  11.1*  MG 2.5*  --   --  2.2   < > = values in this interval not displayed.   Liver Function Tests No results for input(s): AST, ALT, ALKPHOS, BILITOT, PROT, ALBUMIN in the last 72 hours. No results for input(s): LIPASE, AMYLASE in the last 72 hours. Cardiac Enzymes No results for input(s): CKTOTAL, CKMB, CKMBINDEX, TROPONINI in the last 72 hours.  BNP:  BNP (last 3 results) Recent Labs    05/03/21 2349  BNP 670.0*    ProBNP (last 3 results) No results for input(s): PROBNP in the last 8760 hours.   D-Dimer No results for input(s): DDIMER in the last 72 hours. Hemoglobin A1C No results for input(s): HGBA1C in the last 72 hours.  Fasting Lipid Panel No results for input(s): CHOL, HDL, LDLCALC, TRIG, CHOLHDL, LDLDIRECT in the last 72 hours. Thyroid Function Tests No results for input(s): TSH, T4TOTAL, T3FREE, THYROIDAB in the last 72 hours.  Invalid input(s): FREET3  Other results:   Imaging    No results found.   Medications:     Scheduled Medications:  apixaban  5 mg Oral BID   Chlorhexidine Gluconate Cloth  6 each Topical Daily   digoxin  0.125 mg Oral Daily   losartan  50 mg Oral Daily   sodium chloride flush  10-40 mL Intracatheter Q12H   sodium chloride flush  3 mL Intravenous Q12H   spironolactone  25 mg Oral Daily    Infusions:  sodium chloride 20 mL/hr at  05/12/21 0715   sodium chloride     amiodarone 30 mg/hr (05/12/21 0715)    PRN Medications: sodium chloride, acetaminophen, acetaminophen, albuterol, ALPRAZolam, benzonatate, docusate sodium, guaiFENesin, magnesium hydroxide, ondansetron (ZOFRAN) IV, ondansetron (ZOFRAN) IV, sodium chloride flush, sodium chloride flush    Patient Profile   Aveah is a 50 y.o. female with history of HTN and obesity admitted with atrial fibrillation with RVR and acute systolic HF.   Assessment/Plan  Acute systolic HF with low output/cardiomyopathy: -Suspect etiology likely tachy mediated in setting of AF with RVR. Uncontrolled HTN may also be playing a role. Myocarditis also a consideration. However, troponin flat and just minimally elevated.   -Echo with LVEF < 20%, moderate LVH, moderately reduced RV, severe MR -Co-ox 63% off milrinone -R/LHC with normal coronaries, RA 2, PA 31/8, Fick CI 2.5, PA sat 67% -IV lasix stopped 10/31. Diuresed 25 lb this admit. Up 2 lb overnight but volume appears okay on exam. Will start po torsemide 20 mg daily tomorrow. -Continue Spiro 25 mg daily. -Increase losartan to 100 mg daily d/t elevated BP (no ARNI d/t hx angioedema with ACEi) -Continue digoxin 0.125 mg daily -Consider SGLT2i down the road.  -No BB d/t low output HF -Continue unna boots.    2. Atrial fibrillation with RVR: -New diagnosis this admit, suspect duration for several weeks based on onset of symptoms -TSH WNL -CHADS2-VASc score at least 3 (female, HTN, CHF), now on eliquis 5 mg BID -Continue amio gtt, transition to po post cardioversion -Plan for TEE/DCCV today - Will need formal sleep study as outpatient   3. Mitral regurgitation: -Severe on echo -Suspect functional   4. HTN: -BP elevated this am -Medications as above   5. Morbid obesity: -Body mass index is 52.06 kg/m.  6. Hypokalemia: -K 3.5. Replace prior to cardioversion -Mag 2.2. Stable.  -Continue spiro to 25 mg daily   7.  Possible LV thrombus: -On Eliquis   Length of Stay: [place], [place], PA-C  05/12/2021, 8:39 AM  Advanced Heart Failure Team Pager [phone] (M-F; 7a - 5p)  Please contact [facility] for night-coverage after hours (5p -7a ) and weekends on [email or web address]   Patient seen and examined with the above-signed Advanced Practice Provider and/or Housestaff. I personally reviewed laboratory data, imaging studies and relevant notes. I independently examined the patient and formulated the important aspects of the plan. I have  edited the note to reflect any of my changes or salient points. I have personally discussed the plan with the patient and/or family.  Underwent cath yesterday. No CAD. Milrinone and IV lasix stopped with low filling pressures and normal CO. Remains on IV amio. Switched to Eliquis yesterday. Will get 3rd dose this am.   Co-ox stable off milrinone   Denies CP or SOB. Anxious about TEE/DC-CV  General:  Sitting up. No resp difficulty HEENT: normal Neck: supple. no JVD. Carotids 2+ bilat; no bruits. No lymphadenopathy or thryomegaly appreciated. Cor: PMI nondisplaced. Irregular rate & rhythm. No rubs, gallops or murmurs. Lungs: clear Abdomen: obese soft, nontender, nondistended. No hepatosplenomegaly. No bruits or masses. Good bowel sounds. Extremities: no cyanosis, clubbing, rash, edema Neuro: alert & orientedx3, cranial nerves grossly intact. moves all 4 extremities w/o difficulty. Affect pleasant  Volume status much improved. Co-ox stable off milrinone. On IV amio for AF. Will plan TEE/DC-CV today. Procedure discussed. Continue to titrate GDMT.   Glori Bickers, MD  9:06 AM

## 2021-05-12 NOTE — Progress Notes (Addendum)
Advanced Heart Failure Rounding Note  PCP-Cardiologist: None   Subjective:    IV lasix stopped 10/31. Diuresed 25 lb this admit.  Milrinone off. Co-ox stable at 63%.  Feeling well today. No dyspnea at rest. Anxious about cardioversion.  Stil in AF. Rate 80s on IV amio. Heparin gtt  R/LHC, 10/31 Findings:   Ao = 156/89 (112) LV =135/7 RA = 2 RV = 27/6 PA = 31/8 (20) PCW = 7 Fick cardiac output/index = 5.6/2.5 PVR = 2.2 WU FA sat = 99% PA sat = 67%. 69% SVC sat = 72%   Assessment: 1. Normal coronary arteries 2. 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 Well-compensated hemodynamics after large-volume diuresis  Objective:   Weight Range: 133.3 kg Body mass index is 52.06 kg/m.   Vital Signs:   Temp:  [97.8 F (36.6 C)-99 F (37.2 C)] 97.9 F (36.6 C) (11/01 0736) Pulse Rate:  [79-107] 88 (11/01 0736) Resp:  [17-24] 19 (11/01 0736) BP: (126-165)/(68-104) 151/86 (11/01 0736) SpO2:  [95 %-99 %] 96 % (11/01 0736) Weight:  [133.3 kg] 133.3 kg (11/01 0505) Last BM Date: 05/12/21  Weight change: Filed Weights   05/10/21 0450 05/11/21 0528 05/12/21 0505  Weight: 134 kg 132.2 kg 133.3 kg    Intake/Output:   Intake/Output Summary (Last 24 hours) at 05/12/2021 0839 Last data filed at 05/12/2021 0715 Gross per 24 hour  Intake 912.96 ml  Output 650 ml  Net 262.96 ml      Physical Exam   General:  Lying in bed. No distress.  HEENT: normal Neck: supple. no JVD. Carotids 2+ bilat; no bruits. No lymphadenopathy or thryomegaly appreciated. Cor: PMI nondisplaced. Irregular rhythm.  No rubs, gallops or murmurs. Lungs: clear Abdomen: soft, nontender, nondistended. No hepatosplenomegaly. No bruits or masses. Good bowel sounds. Extremities: no cyanosis, clubbing, rash, trace edema, UNNA boots on Neuro: alert & orientedx3, cranial nerves grossly intact. moves all 4 extremities w/o difficulty. Affect pleasant     Telemetry   AF  80s-90s (personally reviewed)   Labs    CBC Recent Labs    05/11/21 0430 05/11/21 0800 05/11/21 0804 05/12/21 0427  WBC 9.0  --   --  7.6  HGB 12.8   < > 11.9* 12.3  HCT 39.2   < > 35.0* 38.4  MCV 92.9  --   --  92.8  PLT 410*  --   --  394   < > = values in this interval not displayed.   Basic Metabolic Panel Recent Labs    05/11/21 0430 05/11/21 0800 05/11/21 0804 05/12/21 0427  NA 134*   < > 141 134*  K 3.6   < > 2.7* 3.5  CL 98  --   --  101  CO2 30  --   --  29  GLUCOSE 133*  --   --  146*  BUN 17  --   --  14  CREATININE 1.16*  --   --  0.96  CALCIUM 11.5*  --   --  11.1*  MG 2.5*  --   --  2.2   < > = values in this interval not displayed.   Liver Function Tests No results for input(s): AST, ALT, ALKPHOS, BILITOT, PROT, ALBUMIN in the last 72 hours. No results for input(s): LIPASE, AMYLASE in the last 72 hours. Cardiac Enzymes No results for input(s): CKTOTAL, CKMB, CKMBINDEX, TROPONINI in the last 72 hours.  BNP:  BNP (last 3 results) Recent Labs    05/03/21 2349  BNP 670.0*    ProBNP (last 3 results) No results for input(s): PROBNP in the last 8760 hours.   D-Dimer No results for input(s): DDIMER in the last 72 hours. Hemoglobin A1C No results for input(s): HGBA1C in the last 72 hours.  Fasting Lipid Panel No results for input(s): CHOL, HDL, LDLCALC, TRIG, CHOLHDL, LDLDIRECT in the last 72 hours. Thyroid Function Tests No results for input(s): TSH, T4TOTAL, T3FREE, THYROIDAB in the last 72 hours.  Invalid input(s): FREET3  Other results:   Imaging    No results found.   Medications:     Scheduled Medications:  apixaban  5 mg Oral BID   Chlorhexidine Gluconate Cloth  6 each Topical Daily   digoxin  0.125 mg Oral Daily   losartan  50 mg Oral Daily   sodium chloride flush  10-40 mL Intracatheter Q12H   sodium chloride flush  3 mL Intravenous Q12H   spironolactone  25 mg Oral Daily    Infusions:  sodium chloride 20 mL/hr at  05/12/21 0715   sodium chloride     amiodarone 30 mg/hr (05/12/21 0715)    PRN Medications: sodium chloride, acetaminophen, acetaminophen, albuterol, ALPRAZolam, benzonatate, docusate sodium, guaiFENesin, magnesium hydroxide, ondansetron (ZOFRAN) IV, ondansetron (ZOFRAN) IV, sodium chloride flush, sodium chloride flush    Patient Profile   Christine Oneal is a 50 y.o. female with history of HTN and obesity admitted with atrial fibrillation with RVR and acute systolic HF.   Assessment/Plan  Acute systolic HF with low output/cardiomyopathy: -Suspect etiology likely tachy mediated in setting of AF with RVR. Uncontrolled HTN may also be playing a role. Myocarditis also a consideration. However, troponin flat and just minimally elevated.   -Echo with LVEF < 20%, moderate LVH, moderately reduced RV, severe MR -Co-ox 63% off milrinone -R/LHC with normal coronaries, RA 2, PA 31/8, Fick CI 2.5, PA sat 67% -IV lasix stopped 10/31. Diuresed 25 lb this admit. Up 2 lb overnight but volume appears okay on exam. Will start po torsemide 20 mg daily tomorrow. -Continue Spiro 25 mg daily. -Increase losartan to 100 mg daily d/t elevated BP (no ARNI d/t hx angioedema with ACEi) -Continue digoxin 0.125 mg daily -Consider SGLT2i down the road.  -No BB d/t low output HF -Continue unna boots.    2. Atrial fibrillation with RVR: -New diagnosis this admit, suspect duration for several weeks based on onset of symptoms -TSH WNL -CHADS2-VASc score at least 3 (female, HTN, CHF), now on eliquis 5 mg BID -Continue amio gtt, transition to po post cardioversion -Plan for TEE/DCCV today - Will need formal sleep study as outpatient   3. Mitral regurgitation: -Severe on echo -Suspect functional   4. HTN: -BP elevated this am -Medications as above   5. Morbid obesity: -Body mass index is 52.06 kg/m.  6. Hypokalemia: -K 3.5. Replace prior to cardioversion -Mag 2.2. Stable.  -Continue spiro to 25 mg daily   7.  Possible LV thrombus: -On Eliquis   Length of Stay: [place], [place], PA-C  05/12/2021, 8:39 AM  Advanced Heart Failure Team Pager [phone] (M-F; 7a - 5p)  Please contact [facility] for night-coverage after hours (5p -7a ) and weekends on [email or web address]   Patient seen and examined with the above-signed Advanced Practice Provider and/or Housestaff. I personally reviewed laboratory data, imaging studies and relevant notes. I independently examined the patient and formulated the important aspects of the plan. I have  edited the note to reflect any of my changes or salient points. I have personally discussed the plan with the patient and/or family.  Underwent cath yesterday. No CAD. Milrinone and IV lasix stopped with low filling pressures and normal CO. Remains on IV amio. Switched to Eliquis yesterday. Will get 3rd dose this am.   Co-ox stable off milrinone   Denies CP or SOB. Anxious about TEE/DC-CV  General:  Sitting up. No resp difficulty HEENT: normal Neck: supple. no JVD. Carotids 2+ bilat; no bruits. No lymphadenopathy or thryomegaly appreciated. Cor: PMI nondisplaced. Irregular rate & rhythm. No rubs, gallops or murmurs. Lungs: clear Abdomen: obese soft, nontender, nondistended. No hepatosplenomegaly. No bruits or masses. Good bowel sounds. Extremities: no cyanosis, clubbing, rash, edema Neuro: alert & orientedx3, cranial nerves grossly intact. moves all 4 extremities w/o difficulty. Affect pleasant  Volume status much improved. Co-ox stable off milrinone. On IV amio for AF. Will plan TEE/DC-CV today. Procedure discussed. Continue to titrate GDMT.   Glori Bickers, MD  9:06 AM

## 2021-05-12 NOTE — Anesthesia Postprocedure Evaluation (Signed)
Anesthesia Post Note  Patient: TEEGHAN HAMMER  Procedure(s) Performed: TRANSESOPHAGEAL ECHOCARDIOGRAM (TEE) CARDIOVERSION     Patient location during evaluation: Endoscopy Anesthesia Type: General Level of consciousness: awake and alert Pain management: pain level controlled Vital Signs Assessment: post-procedure vital signs reviewed and stable Respiratory status: spontaneous breathing, nonlabored ventilation, respiratory function stable and patient connected to nasal cannula oxygen Cardiovascular status: blood pressure returned to baseline and stable Postop Assessment: no apparent nausea or vomiting Anesthetic complications: no   No notable events documented.  Last Vitals:  Vitals:   05/12/21 1408 05/12/21 1427  BP: 114/79 127/88  Pulse: 94 80  Resp: (!) 24 (!) 22  Temp: (!) 36.1 C   SpO2: 96% 98%    Last Pain:  Vitals:   05/12/21 1408  TempSrc: Temporal  PainSc: 0-No pain                 Catalina Gravel

## 2021-05-12 NOTE — Transfer of Care (Signed)
Immediate Anesthesia Transfer of Care Note  Patient: Christine Oneal  Procedure(s) Performed: TRANSESOPHAGEAL ECHOCARDIOGRAM (TEE) CARDIOVERSION  Patient Location: Endoscopy Unit  Anesthesia Type:MAC  Level of Consciousness: awake and alert   Airway & Oxygen Therapy: Patient Spontanous Breathing  Post-op Assessment: Report given to RN and Post -op Vital signs reviewed and stable  Post vital signs: Reviewed and stable  Last Vitals:  Vitals Value Taken Time  BP 114/79 05/12/21 1408  Temp 36.1 C 05/12/21 1408  Pulse 86 05/12/21 1410  Resp 19 05/12/21 1410  SpO2 96 % 05/12/21 1410  Vitals shown include unvalidated device data.  Last Pain:  Vitals:   05/12/21 1408  TempSrc: Temporal  PainSc: 0-No pain      Patients Stated Pain Goal: 0 (88/91/69 4503)  Complications: No notable events documented.

## 2021-05-12 NOTE — Progress Notes (Signed)
PT Cancellation Note  Patient Details Name: Christine Oneal MRN: [id number] DOB: 1970/08/15   Cancelled Treatment:    Reason Eval/Treat Not Completed: Patient at procedure or test/unavailable   Shary Decamp [facility] 05/12/2021, 2:28 PM [place] Pager [phone] Office [phone]

## 2021-05-12 NOTE — Anesthesia Procedure Notes (Signed)
Procedure Name: MAC Date/Time: 05/12/2021 1:40 PM Performed by: Reece Agar, CRNA Pre-anesthesia Checklist: Patient identified, Emergency Drugs available, Suction available and Patient being monitored Oxygen Delivery Method: Nasal cannula

## 2021-05-12 NOTE — TOC Initial Note (Addendum)
Transition of Care [facility]) - Initial/Assessment Note    Patient Details  Name: Christine Oneal MRN: [id number] Date of Birth: July 06, 1971  [facility]) CM/SW Contact:    Erenest Rasher, RN Phone Number: [phone] 05/12/2021, 11:54 AM  Clinical Narrative:                 HF TOC CM spoke to pt and states she does have insurance. Will continue to follow for dc needs. Her Medicaid is Psychologist, educational. Pt will call her Caseworker in am to follow up.   Expected Discharge Plan: Home/Self Care Barriers to Discharge: Continued Medical Work up   Patient Goals and CMS Choice Patient states their goals for this hospitalization and ongoing recovery are:: remain independent CMS Medicare.gov Compare Post Acute Care list provided to:: Patient    Expected Discharge Plan and Services Expected Discharge Plan: Home/Self Care In-house Referral: Clinical Social Work Discharge Planning Services: CM Consult   Living arrangements for the past 2 months: Single Family Home Expected Discharge Date: 05/11/21                                    Prior Living Arrangements/Services Living arrangements for the past 2 months: Single Family Home Lives with:: Adult Children Patient language and need for interpreter reviewed:: Yes Do you feel safe going back to the place where you live?: Yes      Need for Family Participation in Patient Care: No (Comment) Care giver support system in place?: No (comment)   Criminal Activity/Legal Involvement Pertinent to Current Situation/Hospitalization: No - Comment as needed  Activities of Daily Living Home Assistive Devices/Equipment: None ADL Screening (condition at time of admission) Patient's cognitive ability adequate to safely complete daily activities?: Yes Is the patient deaf or have difficulty hearing?: No Does the patient have difficulty seeing, even when wearing glasses/contacts?: No Does the patient have difficulty concentrating,  remembering, or making decisions?: No Patient able to express need for assistance with ADLs?: Yes Does the patient have difficulty dressing or bathing?: No Independently performs ADLs?: Yes (appropriate for developmental age) Does the patient have difficulty walking or climbing stairs?: Yes Weakness of Legs: Both Weakness of Arms/Hands: None  Permission Sought/Granted Permission sought to share information with : Case Manager, Family Supports, PCP Permission granted to share information with : Yes, Verbal Permission Granted  Share Information with NAME: Leonor Liv     Permission granted to share info w Relationship: daughter  Permission granted to share info w Contact Information: [phone]  Emotional Assessment Appearance:: Appears stated age Attitude/Demeanor/Rapport: Gracious, Engaged Affect (typically observed): Accepting Orientation: : Oriented to Self, Oriented to Place, Oriented to  Time, Oriented to Situation   Psych Involvement: No (comment)  Admission diagnosis:  Atrial fibrillation ([place]) [I48.91] Atrial fibrillation with RVR (HCC) [N23.55] Acute systolic (congestive) heart failure (HCC) [I50.21] Acute congestive heart failure, unspecified heart failure type [facility]) [I50.9] Patient Active Problem List   Diagnosis Date Noted   Acute systolic (congestive) heart failure ([place]) 05/05/2021   Atrial fibrillation with RVR ([place]) 05/04/2021   Acute congestive heart failure ([place])    Irregular heart beats 03/18/2020   Anemia 09/04/2017   BMI 50.0-59.9, adult ([place]) 09/03/2017   Reactive airway disease 09/03/2017   Vitamin D deficiency 09/03/2017   Leukocytosis 10/18/2013   Essential hypertension 10/24/2007   EXTERNAL HEMORRHOIDS 10/24/2007   ANAL FISSURE 10/24/2007   PCP:  Maximiano Coss, NP Pharmacy:   [id number] - [address], [place] [address] Phone: [phone] Fax:  [phone]  [id number] - [place], [address] [place] [address] Phone: [phone] Fax: [phone]     Social Determinants of Health ([place]) Interventions    Readmission Risk Interventions No flowsheet data found.

## 2021-05-12 NOTE — CV Procedure (Signed)
   TRANSESOPHAGEAL ECHOCARDIOGRAM GUIDED DIRECT CURRENT CARDIOVERSION  NAME:  Christine Oneal   MRN: [id number] DOB:  1971/03/03   ADMIT DATE: 05/03/2021  INDICATIONS:  Atrial fibrillation  PROCEDURE:   Informed consent was obtained prior to the procedure. The risks, benefits and alternatives for the procedure were discussed and the patient comprehended these risks.  Risks include, but are not limited to, cough, sore throat, vomiting, nausea, somnolence, esophageal and stomach trauma or perforation, bleeding, low blood pressure, aspiration, pneumonia, infection, trauma to the teeth and death.    After a procedural time-out, the oropharynx was anesthetized and the patient was sedated by the anesthesia service. The transesophageal probe was inserted in the esophagus and stomach without difficulty and multiple views were obtained.   FINDINGS:  LEFT VENTRICLE: EF = 30-35%  RIGHT VENTRICLE: Normal  LEFT ATRIUM: Moderately dilated   LEFT ATRIAL APPENDAGE: No clot   RIGHT ATRIUM: Normal   AORTIC VALVE:  [place]. Trivial AI   MITRAL VALVE:    Mild MR  TRICUSPID VALVE: Mild TR  PULMONIC VALVE: Trivial PR  INTERATRIAL SEPTUM: Small PFO  PERICARDIUM: No effusion  DESCENDING AORTA: Mild plaque   CARDIOVERSION:     Indications:  Atrial Fibrillation  Procedure Details:  Once the TEE was complete, the patient had the defibrillator pads placed in the anterior and posterior position. Once an appropriate level of sedation was achieved, the patient received a biphasic, synchronized 200J shock with manual pressure held on the anterior pad with no conversion to sinus rhythm The pads were repositioned and we attempted 2 more times be remained unsuccessful at converting her. No apparent complications.   Glori Bickers, MD  2:08 PM

## 2021-05-12 NOTE — Interval H&P Note (Signed)
History and Physical Interval Note:  05/12/2021 9:08 AM  Christine Oneal  has presented today for surgery, with the diagnosis of AF  The various methods of treatment have been discussed with the patient and family. After consideration of risks, benefits and other options for treatment, the patient has consented to  transesophageal echocardiogram and electrical cardioversion as a surgical intervention.  The patient's history has been reviewed, patient examined, no change in status, stable for surgery.  I have reviewed the patient's chart and labs.  Questions were answered to the patient's satisfaction.     [name]

## 2021-05-12 NOTE — Plan of Care (Signed)
  Problem: Education: Goal: Knowledge of General Education information will improve Description: Including pain rating scale, medication(s)/side effects and non-pharmacologic comfort measures Outcome: Progressing   Problem: Health Behavior/Discharge Planning: Goal: Ability to manage health-related needs will improve Outcome: Progressing   Problem: Clinical Measurements: Goal: Ability to maintain clinical measurements within normal limits will improve Outcome: Progressing Goal: Will remain free from infection Outcome: Progressing Goal: Diagnostic test results will improve Outcome: Progressing Goal: Respiratory complications will improve Outcome: Progressing Goal: Cardiovascular complication will be avoided Outcome: Progressing   Problem: Activity: Goal: Risk for activity intolerance will decrease Outcome: Progressing   Problem: Nutrition: Goal: Adequate nutrition will be maintained Outcome: Progressing   Problem: Coping: Goal: Level of anxiety will decrease Outcome: Progressing   Problem: Elimination: Goal: Will not experience complications related to bowel motility Outcome: Progressing   Problem: Pain Managment: Goal: General experience of comfort will improve Outcome: Progressing   

## 2021-05-12 NOTE — Progress Notes (Signed)
Mobility Specialist Progress Note    05/12/21 1054  Mobility  Activity Ambulated in hall  Level of Assistance Standby assist, set-up cues, supervision of patient - no hands on  Assistive Device  (hallway rails)  Distance Ambulated (ft) 330 ft  Mobility Ambulated independently in hallway  Mobility Response Tolerated well  Mobility performed by Mobility specialist  Bed Position Chair  $Mobility charge 1 Mobility   Pre-Mobility: 83 HR, 144/76 BP, 98% SpO2 During Mobility: 128 HR Post-Mobility: 105 HR, 144/96 BP, 97% SpO2  Pt received in bed and agreeable. No complaints on walk. Pt had slow pace and practice pursed lip breathing. Returned to chair with call bell in reach.   Hildred Alamin Mobility Specialist  Mobility Specialist Phone: [phone]

## 2021-05-13 ENCOUNTER — Encounter (HOSPITAL_COMMUNITY): Payer: Self-pay | Admitting: Internal Medicine

## 2021-05-13 LAB — BASIC METABOLIC PANEL
Anion gap: 5 (ref 5–15)
BUN: 14 mg/dL (ref 6–20)
CO2: 26 mmol/L (ref 22–32)
Calcium: 11.5 mg/dL — ABNORMAL HIGH (ref 8.9–10.3)
Chloride: 101 mmol/L (ref 98–111)
Creatinine, Ser: 0.98 mg/dL (ref 0.44–1.00)
GFR, Estimated: >60 mL/min (ref >60)
Glucose, Bld: 199 mg/dL — ABNORMAL HIGH (ref 70–99)
Potassium: 3.9 mmol/L (ref 3.5–5.1)
Sodium: 132 mmol/L — ABNORMAL LOW (ref 135–145)

## 2021-05-13 LAB — CBC
HCT: 38.1 % (ref 36.0–46.0)
Hemoglobin: 12 g/dL (ref 12.0–15.0)
MCH: 29.6 pg (ref 26.0–34.0)
MCHC: 31.5 g/dL (ref 30.0–36.0)
MCV: 94.1 fL (ref 80.0–100.0)
Platelets: 399 10*3/uL (ref 150–400)
RBC: 4.05 MIL/uL (ref 3.87–5.11)
RDW: 14.2 % (ref 11.5–15.5)
WBC: 6.8 10*3/uL (ref 4.0–10.5)
nRBC: 0 % (ref 0.0–0.2)

## 2021-05-13 LAB — COOXEMETRY PANEL
Carboxyhemoglobin: 1.8 % — ABNORMAL HIGH (ref 0.5–1.5)
Methemoglobin: 0.8 % (ref 0.0–1.5)
O2 Saturation: 63 %
Total hemoglobin: 10.6 g/dL — ABNORMAL LOW (ref 12.0–16.0)

## 2021-05-13 LAB — DIGOXIN LEVEL: Digoxin Level: 0.5 ng/mL — ABNORMAL LOW (ref 0.8–2.0)

## 2021-05-13 LAB — MAGNESIUM: Magnesium: 2.1 mg/dL (ref 1.7–2.4)

## 2021-05-13 MED ORDER — TORSEMIDE 20 MG PO TABS
40.0000 mg | ORAL_TABLET | Freq: Every day | ORAL | Status: DC
  Administered 2021-05-13: 40 mg via ORAL
  Filled 2021-05-13: qty 2

## 2021-05-13 MED ORDER — EMPAGLIFLOZIN 10 MG PO TABS: 10.0000 mg | ORAL_TABLET | Freq: Every day | ORAL | Status: DC

## 2021-05-13 MED ORDER — POTASSIUM CHLORIDE CRYS ER 20 MEQ PO TBCR
40.0000 meq | EXTENDED_RELEASE_TABLET | Freq: Every day | ORAL | Status: DC
  Administered 2021-05-13: 40 meq via ORAL
  Filled 2021-05-13: qty 2

## 2021-05-13 MED ORDER — DAPAGLIFLOZIN PROPANEDIOL 10 MG PO TABS
10.0000 mg | ORAL_TABLET | Freq: Every day | ORAL | Status: DC
  Administered 2021-05-13 – 2021-05-14 (×2): 10 mg via ORAL
  Filled 2021-05-13 (×2): qty 1

## 2021-05-13 MED ORDER — ISOSORB DINITRATE-HYDRALAZINE 20-37.5 MG PO TABS
1.0000 | ORAL_TABLET | Freq: Three times a day (TID) | ORAL | Status: DC
  Administered 2021-05-13 – 2021-05-14 (×4): 1 via ORAL
  Filled 2021-05-13 (×4): qty 1

## 2021-05-13 MED ORDER — AMIODARONE HCL 200 MG PO TABS
200.0000 mg | ORAL_TABLET | Freq: Two times a day (BID) | ORAL | Status: DC
  Administered 2021-05-13 – 2021-05-14 (×3): 200 mg via ORAL
  Filled 2021-05-13 (×3): qty 1

## 2021-05-13 NOTE — Progress Notes (Addendum)
Advanced Heart Failure Rounding Note  PCP-Cardiologist: None   Subjective:    11/01: No LAA thrombus on TEE. Unsuccessful DCCV after 3 shocks  IV lasix stopped 10/31. Down 28 lb this admit.  Milrinone off.   CVP 5  Hypertensive.   Feels okay. No dyspnea. Walking halls every day.  R/LHC, 10/31 Findings:   Ao = 156/89 (112) LV =135/7 RA = 2 RV = 27/6 PA = 31/8 (20) PCW = 7 Fick cardiac output/index = 5.6/2.5 PVR = 2.2 WU FA sat = 99% PA sat = 67%. 69% SVC sat = 72%   Assessment: 1. Normal coronary arteries 2. 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 Well-compensated hemodynamics after large-volume diuresis  Objective:   Weight Range: 132.3 kg Body mass index is 51.67 kg/m.   Vital Signs:   Temp:  [97 F (36.1 C)-98.6 F (37 C)] 98.5 F (36.9 C) (11/02 0744) Pulse Rate:  [80-106] 83 (11/02 0744) Resp:  [16-24] 20 (11/02 0744) BP: (114-169)/(48-102) 141/96 (11/02 0744) SpO2:  [90 %-100 %] 90 % (11/02 0744) Weight:  [132.3 kg-133.3 kg] 132.3 kg (11/02 0235) Last BM Date: 05/11/21  Weight change: Filed Weights   05/12/21 0505 05/12/21 1253 05/13/21 0235  Weight: 133.3 kg 133.3 kg 132.3 kg    Intake/Output:   Intake/Output Summary (Last 24 hours) at 05/13/2021 0903 Last data filed at 05/13/2021 0300 Gross per 24 hour  Intake 1310.82 ml  Output 1100 ml  Net 210.82 ml      Physical Exam   General:  Sitting up in chair HEENT: normal Neck: supple. no JVD. Carotids 2+ bilat; no bruits. No lymphadenopathy or thryomegaly appreciated. Cor: PMI nondisplaced. Irregular rhythm.  No rubs, gallops, 2/6 MR murmur Lungs: clear Abdomen: soft, nontender, nondistended, obese. No hepatosplenomegaly.  Extremities: no cyanosis, clubbing, rash, trace edema, UNNA boots on Neuro: alert & orientedx3, cranial nerves grossly intact. moves all 4 extremities w/o difficulty. Affect pleasant     Telemetry   AF 90s (personally  reviewed)   Labs    CBC Recent Labs    05/12/21 0427 05/13/21 0443  WBC 7.6 6.8  HGB 12.3 12.0  HCT 38.4 38.1  MCV 92.8 94.1  PLT 394 962   Basic Metabolic Panel Recent Labs    05/12/21 0427 05/13/21 0443  NA 134* 132*  K 3.5 3.9  CL 101 101  CO2 29 26  GLUCOSE 146* 199*  BUN 14 14  CREATININE 0.96 0.98  CALCIUM 11.1* 11.5*  MG 2.2 2.1   Liver Function Tests No results for input(s): AST, ALT, ALKPHOS, BILITOT, PROT, ALBUMIN in the last 72 hours. No results for input(s): LIPASE, AMYLASE in the last 72 hours. Cardiac Enzymes No results for input(s): CKTOTAL, CKMB, CKMBINDEX, TROPONINI in the last 72 hours.  BNP: BNP (last 3 results) Recent Labs    05/03/21 2349  BNP 670.0*    ProBNP (last 3 results) No results for input(s): PROBNP in the last 8760 hours.   D-Dimer No results for input(s): DDIMER in the last 72 hours. Hemoglobin A1C No results for input(s): HGBA1C in the last 72 hours.  Fasting Lipid Panel No results for input(s): CHOL, HDL, LDLCALC, TRIG, CHOLHDL, LDLDIRECT in the last 72 hours. Thyroid Function Tests No results for input(s): TSH, T4TOTAL, T3FREE, THYROIDAB in the last 72 hours.  Invalid input(s): FREET3  Other results:   Imaging    ECHO TEE  Result Date: 05/12/2021  TRANSESOPHOGEAL ECHO REPORT   Patient Name:   Christine Oneal Date of Exam: 05/12/2021 Medical Rec #:  [id number]        Height:       63.0 in Accession #:    [id number]       Weight:       293.9 lb Date of Birth:  [birth year]         BSA:          2.277 m Patient Age:    50 years         BP:           114/79 mmHg Patient Gender: F                HR:           94 bpm. Exam Location:  Inpatient Procedure: Transesophageal Echo, Cardiac Doppler and Color Doppler Indications:     Atrial fibrillation  History:         Patient has prior history of Echocardiogram examinations, most                  recent 05/04/2021. CHF, Arrythmias:Atrial Fibrillation; Risk                   Factors:Hypertension and Morbid obesity.  Sonographer:     Dustin Flock RDCS Referring Phys:  [place] Diagnosing Phys: Glori Bickers MD PROCEDURE: The transesophogeal probe was passed without difficulty through the esophogus of the patient. Sedation performed by different physician. The patient was monitored while under deep sedation. Anesthestetic sedation was provided intravenously by Anesthesiology: 226.62mg  of Propofol, 50mg  of Lidocaine. The patient developed no complications during the procedure. IMPRESSIONS  1. Left ventricular ejection fraction, by estimation, is 25 to 30%. The left ventricle has severely decreased function. The left ventricle demonstrates global hypokinesis.  2. Right ventricular systolic function is normal. The right ventricular size is normal.  3. Left atrial size was moderately dilated. No left atrial/left atrial appendage thrombus was detected.  4. Right atrial size was moderately dilated.  5. The mitral valve is normal in structure. Mild mitral valve regurgitation.  6. The aortic valve is normal in structure. Aortic valve regurgitation is trivial.  7. There is mild (Grade II) plaque involving the descending aorta.  8. Evidence of atrial level shunting detected by color flow Doppler. There is a small patent foramen ovale. FINDINGS  Left Ventricle: Left ventricular ejection fraction, by estimation, is 25 to 30%. The left ventricle has severely decreased function. The left ventricle demonstrates global hypokinesis. The left ventricular internal cavity size was normal in size. Right Ventricle: The right ventricular size is normal. No increase in right ventricular wall thickness. Right ventricular systolic function is normal. Left Atrium: Left atrial size was moderately dilated. No left atrial/left atrial appendage thrombus was detected. Right Atrium: Right atrial size was moderately dilated. Pericardium: There is no evidence of pericardial effusion. Mitral Valve:  The mitral valve is normal in structure. Mild mitral valve regurgitation. Tricuspid Valve: The tricuspid valve is normal in structure. Tricuspid valve regurgitation is mild. Aortic Valve: The aortic valve is normal in structure. Aortic valve regurgitation is trivial. Pulmonic Valve: The pulmonic valve was normal in structure. Pulmonic valve regurgitation is trivial. Aorta: The aortic root is normal in size and structure. There is mild (Grade II) plaque involving the descending aorta. IAS/Shunts: Evidence of atrial level shunting detected by color flow Doppler. A small patent foramen ovale is detected. Quillian Quince  Starlene Consuegra MD Electronically signed by Glori Bickers MD Signature Date/Time: 05/12/2021/2:54:34 PM    Final      Medications:     Scheduled Medications:  apixaban  5 mg Oral BID   Chlorhexidine Gluconate Cloth  6 each Topical Daily   digoxin  0.125 mg Oral Daily   influenza vac split quadrivalent PF  0.5 mL Intramuscular Tomorrow-1000   losartan  100 mg Oral Daily   pneumococcal 23 valent vaccine  0.5 mL Intramuscular Tomorrow-1000   sodium chloride flush  10-40 mL Intracatheter Q12H   sodium chloride flush  3 mL Intravenous Q12H   spironolactone  25 mg Oral Daily   torsemide  20 mg Oral Daily    Infusions:  sodium chloride     amiodarone 30 mg/hr (05/12/21 2213)    PRN Medications: sodium chloride, acetaminophen, albuterol, ALPRAZolam, benzonatate, docusate sodium, guaiFENesin, hydrocortisone cream, magnesium hydroxide, ondansetron (ZOFRAN) IV, sodium chloride flush, sodium chloride flush    Patient Profile   Christine Oneal is a 50 y.o. female with history of HTN and obesity admitted with atrial fibrillation with RVR and acute systolic HF.   Assessment/Plan  Acute systolic HF with low output/cardiomyopathy: -Suspect etiology likely tachy mediated in setting of AF with RVR. Uncontrolled HTN may also be playing a role. Myocarditis also a consideration. However, troponin flat and just  minimally elevated.   -Echo with LVEF < 20%, moderate LVH, moderately reduced RV, severe MR -Now off milrinone -R/LHC with normal coronaries, RA 2, PA 31/8, Fick CI 2.5, PA sat 67% -IV lasix stopped 10/31. Down 28 lb this admit. CVP 5 today. Start po torsemide 40 mg daily. -Continue Spiro 25 mg daily. -On losartan 100 mg daily (no ARNI d/t hx angioedema with ACEi) -Continue digoxin 0.125 mg daily -Will add bidil 1 tablet TID -Add Farxiga 10 mg daily -No BB d/t low output HF -Continue unna boots.    2. Atrial fibrillation with RVR: -New diagnosis this admit, suspect duration for several weeks based on onset of symptoms -TSH WNL -CHADS2-VASc score at least 3 (female, HTN, CHF), now on eliquis 5 mg BID -Unsuccessful DCCV 11/01, may reattempt restoration of SR in the future -Will switch IV amio to 200 mg BID -Will need formal sleep study as outpatient   3. Mitral regurgitation: -Severe on echo -Suspect functional   4. HTN: -BP elevated this am -Medications as above   5. Morbid obesity: -Body mass index is 51.67 kg/m.  6. Hypokalemia: -K 3.9 today. Replace -Mag 2.1. Stable.  -Continue spiro to 25 mg daily   7. Possible LV thrombus: -On Eliquis   Hopefully home tomorrow with HH/PT   Length of Stay: [place], [place], PA-C  05/13/2021, 9:03 AM  Advanced Heart Failure Team Pager [phone] (M-F; 7a - 5p)  Please contact [facility] for night-coverage after hours (5p -7a ) and weekends on [email or web address]  Patient seen and examined with the above-signed Advanced Practice Provider and/or Housestaff. I personally reviewed laboratory data, imaging studies and relevant notes. I independently examined the patient and formulated the important aspects of the plan. I have edited the note to reflect any of my changes or salient points. I have personally discussed the plan with the patient and/or family.  Failed DC-CV x 3 yesterday. Remains on IV amio. Rate controlled. Denies SOB,  orthopnea or PND  General:  Sitting in chair  No resp difficulty HEENT: normal Neck: supple. no JVD. Carotids 2+ bilat; no bruits. No lymphadenopathy or thryomegaly appreciated. Cor:  PMI nondisplaced. Irregular rate & rhythm. No rubs, gallops or murmurs. Lungs: clear Abdomen: obese soft, nontender, nondistended. No hepatosplenomegaly. No bruits or masses. Good bowel sounds. Extremities: no cyanosis, clubbing, rash, edema Neuro: alert & orientedx3, cranial nerves grossly intact. moves all 4 extremities w/o difficulty. Affect pleasant  Volume status much improved. Failed DC-CV. Will pursue rate control strategy for now. Will leave on amio for the short-term to ensure rate control while her heart attempts to recover. Will slowly transition to b-blocker.   Multiple med changes today discussed at length with PharmD and patient.  Will make sure she is stable with these med changes and possible d/c in am.   Total time spent 35 minutes. Over half that time spent discussing above.    Glori Bickers, MD  3:38 PM

## 2021-05-13 NOTE — TOC Initial Note (Signed)
Transition of Care [facility]) - Initial/Assessment Note    Patient Details  Name: Christine Oneal MRN: [id number] Date of Birth: 12-Mar-1971  Transition of Care [facility]) CM/SW Contact:    Erenest Rasher, RN Phone Number: [phone] 05/13/2021, 11:44 AM  Clinical Narrative:                  HF TOC CM spoke to pt and she provided information on her [employer] Health Plan. Information load into Epic by admission dept. [facility]. Pt has RW in the room and they will ship tub bench to her home. HF Pharmacy team is working on medications. Please send medications to [facility] pharmacy at dc. Pt will receive meds prior to dc.        Expected Discharge Plan: [place] Barriers to Discharge: No Barriers Identified   Patient Goals and CMS Choice Patient states their goals for this hospitalization and ongoing recovery are:: remain independent CMS Medicare.gov Compare Post Acute Care list provided to:: Patient Choice offered to / list presented to : Patient  Expected Discharge Plan and Services Expected Discharge Plan: [place] In-house Referral: Clinical Social Work Discharge Planning Services: CM Consult Post Acute Care Choice: [place] arrangements for the past 2 months: Apartment Expected Discharge Date: 05/11/21                 DME Agency: [employer] Date DME Agency Contacted: 05/13/21 Time DME Agency Contacted: 1143   [place] Arranged: RN, PT [place] Agency: [place] ([place]) Date [place]: 05/13/21 Time [place]: 1143 Representative spoke with at [place]: Elijio Miles  Prior Living Arrangements/Services Living arrangements for the past 2 months: [place] with:: Adult Children Patient language and need for interpreter reviewed:: Yes Do you feel safe going back to the place where you live?: Yes      Need for Family Participation in Patient Care: No  (Comment) Care giver support system in place?: No (comment)   Criminal Activity/Legal Involvement Pertinent to Current Situation/Hospitalization: No - Comment as needed  Activities of Daily Living Home Assistive Devices/Equipment: None ADL Screening (condition at time of admission) Patient's cognitive ability adequate to safely complete daily activities?: Yes Is the patient deaf or have difficulty hearing?: No Does the patient have difficulty seeing, even when wearing glasses/contacts?: No Does the patient have difficulty concentrating, remembering, or making decisions?: No Patient able to express need for assistance with ADLs?: Yes Does the patient have difficulty dressing or bathing?: No Independently performs ADLs?: Yes (appropriate for developmental age) Does the patient have difficulty walking or climbing stairs?: Yes Weakness of Legs: Both Weakness of Arms/Hands: None  Permission Sought/Granted Permission sought to share information with : Case Manager, Family Supports, PCP Permission granted to share information with : Yes, Verbal Permission Granted  Share Information with NAME: Leonor Liv     Permission granted to share info w Relationship: daughter  Permission granted to share info w Contact Information: [phone]  Emotional Assessment Appearance:: Appears stated age Attitude/Demeanor/Rapport: Gracious, Engaged Affect (typically observed): Accepting Orientation: : Oriented to Self, Oriented to Place, Oriented to  Time, Oriented to Situation   Psych Involvement: No (comment)  Admission diagnosis:  Atrial fibrillation ([place]) [I48.91] Atrial fibrillation with RVR (HCC) [G26.94] Acute systolic (congestive) heart failure (HCC) [I50.21] Acute congestive heart failure, unspecified heart failure type [facility]) [I50.9] Patient Active Problem List   Diagnosis Date Noted  Acute systolic (congestive) heart failure ([place]) 05/05/2021   Atrial fibrillation with RVR ([place])  05/04/2021   Acute congestive heart failure (HCC)    Irregular heart beats 03/18/2020   Anemia 09/04/2017   BMI 50.0-59.9, adult ([place]) 09/03/2017   Reactive airway disease 09/03/2017   Vitamin D deficiency 09/03/2017   Leukocytosis 10/18/2013   Essential hypertension 10/24/2007   EXTERNAL HEMORRHOIDS 10/24/2007   ANAL FISSURE 10/24/2007   PCP:  Maximiano Coss, NP Pharmacy:   [facility] [id number] - [address], [place] [address] [place] [address] Phone: [phone] Fax: [phone]  [id number] [place], [place] [address] Phone: [phone] Fax: [phone]  [facility] [address] [place] [address] Phone: [phone] Fax: [phone]     Social Determinants of Health (SDOH) Interventions    Readmission Risk Interventions No flowsheet data found.

## 2021-05-13 NOTE — Progress Notes (Signed)
1320-1400 Cardiac Rehab Pt had recently walked in hall. Completed CHF education with pt. We discussed CHF, zones, when to call the MD,911,low sodium diet, fluid restrictions and walking guidelines. She voices understanding. I placed the CHF education video for her to watch.

## 2021-05-13 NOTE — TOC CM/SW Note (Addendum)
HF TOC CM sent referral to [place] rep, Ramond Marrow to review for charity [facility]. Sent referral to [employer] to assist with Medicaid. Pt will need MATCH to assist with medications. Contacted [employer] for charity RW and tub bench for home.   [place], Heart Failure TOC CM [phone]

## 2021-05-13 NOTE — Progress Notes (Signed)
Physical Therapy Treatment Patient Details Name: Christine Oneal MRN: [id number] DOB: 1971-06-24 Today's Date: 05/13/2021   History of Present Illness The pt is a 50 yo female presenting 27 with c/o SOB x3 weeks, found to be in afib with RVR upon arrival of EMS. S/p L/R heart cath 10/31, pt underwent TEE guided cardioversion 11/1. PMH includes: HTN, obesity, and plantar fasciitis.    PT Comments    Pt tolerates treatment well, ambulating for limited community distances without the use of an assistive device or need for rest breaks. Pt denies symptoms of fatigue or SOB, although tachycardic up to 130 during session. Pt will benefit from continued PT services to improve gait speed and progress stair training. PT recommends discharge home with HHPT when medically ready.   Recommendations for follow up therapy are one component of a multi-disciplinary discharge planning process, led by the attending physician.  Recommendations may be updated based on patient status, additional functional criteria and insurance authorization.  Follow Up Recommendations  Home health PT     Assistance Recommended at Discharge Intermittent Supervision/Assistance  Equipment Recommendations   (shower chair, pt was already delivered a  2 wheeled RW despite PT recommendations for 4 wheeled walker)    Recommendations for Other Services       Precautions / Restrictions Precautions Precautions: Other (comment) Precaution Comments: watch HR Restrictions Weight Bearing Restrictions: No     Mobility  Bed Mobility Overal bed mobility: Modified Independent Bed Mobility: Supine to Sit     Supine to sit: Modified independent (Device/Increase time)     General bed mobility comments: increased time, use of rail    Transfers Overall transfer level: Independent Equipment used: None Transfers: Sit to/from Stand Sit to Stand: Independent                Ambulation/Gait Ambulation/Gait assistance:  Modified independent (Device/Increase time) Gait Distance (Feet): 350 Feet Assistive device: None Gait Pattern/deviations: WFL(Within Functional Limits)   Gait velocity interpretation: <1.8 ft/sec, indicate of risk for recurrent falls General Gait Details: pt with slowed step-through gait, widened BOS and reduced stride length   Stairs             Wheelchair Mobility    Modified Rankin (Stroke Patients Only)       Balance Overall balance assessment: Mild deficits observed, not formally tested                                          Cognition Arousal/Alertness: Awake/alert Behavior During Therapy: WFL for tasks assessed/performed Overall Cognitive Status: Within Functional Limits for tasks assessed                                          Exercises      General Comments General comments (skin integrity, edema, etc.): tachy up to 130 during session, pt denies symptoms. SpO2 97-100% when mobilizing      Pertinent Vitals/Pain Pain Assessment: No/denies pain    Home Living                          Prior Function            PT Goals (current goals can now be found in the care plan section) Progress towards  PT goals: Progressing toward goals    Frequency    Min 3X/week      PT Plan Current plan remains appropriate    Co-evaluation              AM-PAC PT "6 Clicks" Mobility   Outcome Measure  Help needed turning from your back to your side while in a flat bed without using bedrails?: None Help needed moving from lying on your back to sitting on the side of a flat bed without using bedrails?: None Help needed moving to and from a bed to a chair (including a wheelchair)?: None Help needed standing up from a chair using your arms (e.g., wheelchair or bedside chair)?: None Help needed to walk in hospital room?: None Help needed climbing 3-5 steps with a railing? : A Little 6 Click Score: 23    End  of Session   Activity Tolerance: Patient tolerated treatment well Patient left: in chair;with call bell/phone within reach Nurse Communication: Mobility status PT Visit Diagnosis: Other abnormalities of gait and mobility (R26.89);Muscle weakness (generalized) (M62.81)     Time: 8550-1586 PT Time Calculation (min) (ACUTE ONLY): 16 min  Charges:  $Gait Training: 8-22 mins                     Zenaida Niece, PT, DPT Acute Rehabilitation Pager: [phone] Office [place] 05/13/2021, 4:31 PM

## 2021-05-13 NOTE — Progress Notes (Signed)
Mobility Specialist Progress Note    05/13/21 1147  Mobility  Activity Ambulated in hall  Level of Assistance Independent after set-up  Assistive Device None  Distance Ambulated (ft) 380 ft  Mobility Ambulated independently in hallway  Mobility Response Tolerated well  Mobility performed by Mobility specialist  Bed Position Chair  $Mobility charge 1 Mobility   Pre-Mobility: 92 HR, 132/92 BP, 95% SpO2 During Mobility: 130 HR Post-Mobility: 107 HR, 120/90 BP, 93% SpO2  Pt received in chair and agreeable. No complaints on walk. HR ranged from 118 to 130 during. Returned to chair with call bell in reach.   Hildred Alamin Mobility Specialist  Mobility Specialist Phone: [phone]

## 2021-05-14 ENCOUNTER — Other Ambulatory Visit (HOSPITAL_COMMUNITY): Payer: Self-pay

## 2021-05-14 LAB — COOXEMETRY PANEL
Carboxyhemoglobin: 1.4 % (ref 0.5–1.5)
Methemoglobin: 0.9 % (ref 0.0–1.5)
O2 Saturation: 63.8 %
Total hemoglobin: 11.5 g/dL — ABNORMAL LOW (ref 12.0–16.0)

## 2021-05-14 LAB — BASIC METABOLIC PANEL
Anion gap: 5 (ref 5–15)
BUN: 25 mg/dL — ABNORMAL HIGH (ref 6–20)
CO2: 26 mmol/L (ref 22–32)
Calcium: 11.7 mg/dL — ABNORMAL HIGH (ref 8.9–10.3)
Chloride: 105 mmol/L (ref 98–111)
Creatinine, Ser: 1.5 mg/dL — ABNORMAL HIGH (ref 0.44–1.00)
GFR, Estimated: 42 mL/min — ABNORMAL LOW (ref >60)
Glucose, Bld: 125 mg/dL — ABNORMAL HIGH (ref 70–99)
Potassium: 3.7 mmol/L (ref 3.5–5.1)
Sodium: 136 mmol/L (ref 135–145)

## 2021-05-14 LAB — MAGNESIUM: Magnesium: 2.1 mg/dL (ref 1.7–2.4)

## 2021-05-14 LAB — CBC
HCT: 36.5 % (ref 36.0–46.0)
Hemoglobin: 11.6 g/dL — ABNORMAL LOW (ref 12.0–15.0)
MCH: 29.7 pg (ref 26.0–34.0)
MCHC: 31.8 g/dL (ref 30.0–36.0)
MCV: 93.4 fL (ref 80.0–100.0)
Platelets: 385 10*3/uL (ref 150–400)
RBC: 3.91 MIL/uL (ref 3.87–5.11)
RDW: 14.5 % (ref 11.5–15.5)
WBC: 8.9 10*3/uL (ref 4.0–10.5)
nRBC: 0 % (ref 0.0–0.2)

## 2021-05-14 MED ORDER — TORSEMIDE 20 MG PO TABS
20.0000 mg | ORAL_TABLET | Freq: Every day | ORAL | 0 refills | Status: DC
  Filled 2021-05-14: qty 30, 30d supply, fill #0

## 2021-05-14 MED ORDER — ISOSORBIDE MONONITRATE 20 MG PO TABS
20.0000 mg | ORAL_TABLET | Freq: Two times a day (BID) | ORAL | Status: DC
  Filled 2021-05-14: qty 1

## 2021-05-14 MED ORDER — HYDRALAZINE HCL 25 MG PO TABS
37.5000 mg | ORAL_TABLET | Freq: Three times a day (TID) | ORAL | 0 refills | Status: DC
  Filled 2021-05-14: qty 135, 30d supply, fill #0

## 2021-05-14 MED ORDER — DAPAGLIFLOZIN PROPANEDIOL 10 MG PO TABS
10.0000 mg | ORAL_TABLET | Freq: Every day | ORAL | 0 refills | Status: DC
  Filled 2021-05-14: qty 30, 30d supply, fill #0

## 2021-05-14 MED ORDER — ISOSORBIDE MONONITRATE ER 60 MG PO TB24
60.0000 mg | ORAL_TABLET | Freq: Every day | ORAL | 0 refills | Status: DC
  Filled 2021-05-14: qty 30, 30d supply, fill #0

## 2021-05-14 MED ORDER — SODIUM CHLORIDE 0.9 % IV BOLUS
500.0000 mL | Freq: Once | INTRAVENOUS | Status: AC
  Administered 2021-05-14: 500 mL via INTRAVENOUS

## 2021-05-14 MED ORDER — HYDRALAZINE HCL 25 MG PO TABS
37.5000 mg | ORAL_TABLET | Freq: Three times a day (TID) | ORAL | 3 refills | Status: DC
  Filled 2021-05-14: qty 405, 90d supply, fill #0

## 2021-05-14 MED ORDER — POTASSIUM CHLORIDE CRYS ER 20 MEQ PO TBCR
40.0000 meq | EXTENDED_RELEASE_TABLET | Freq: Every day | ORAL | 0 refills | Status: DC
  Filled 2021-05-14: qty 60, 30d supply, fill #0

## 2021-05-14 MED ORDER — AMIODARONE HCL 200 MG PO TABS
200.0000 mg | ORAL_TABLET | Freq: Two times a day (BID) | ORAL | 0 refills | Status: DC
  Filled 2021-05-14: qty 60, 30d supply, fill #0

## 2021-05-14 MED ORDER — DIGOXIN 125 MCG PO TABS
0.1250 mg | ORAL_TABLET | Freq: Every day | ORAL | 0 refills | Status: DC
  Filled 2021-05-14: qty 30, 30d supply, fill #0

## 2021-05-14 MED ORDER — POTASSIUM CHLORIDE CRYS ER 20 MEQ PO TBCR: 40.0000 meq | EXTENDED_RELEASE_TABLET | Freq: Every day | ORAL | Status: DC

## 2021-05-14 MED ORDER — LOSARTAN POTASSIUM 100 MG PO TABS
100.0000 mg | ORAL_TABLET | Freq: Every day | ORAL | 0 refills | Status: DC
  Filled 2021-05-14: qty 30, 30d supply, fill #0

## 2021-05-14 MED ORDER — HYDRALAZINE HCL 25 MG PO TABS
37.5000 mg | ORAL_TABLET | Freq: Three times a day (TID) | ORAL | Status: DC
  Administered 2021-05-14: 37.5 mg via ORAL
  Filled 2021-05-14: qty 2

## 2021-05-14 MED ORDER — ISOSORB DINITRATE-HYDRALAZINE 20-37.5 MG PO TABS
1.0000 | ORAL_TABLET | Freq: Three times a day (TID) | ORAL | 0 refills | Status: DC
  Filled 2021-05-14: qty 90, 30d supply, fill #0

## 2021-05-14 MED ORDER — ISOSORBIDE MONONITRATE ER 60 MG PO TB24
60.0000 mg | ORAL_TABLET | Freq: Every day | ORAL | 3 refills | Status: DC
  Filled 2021-05-14: qty 90, 90d supply, fill #0

## 2021-05-14 MED ORDER — APIXABAN 5 MG PO TABS
5.0000 mg | ORAL_TABLET | Freq: Two times a day (BID) | ORAL | 0 refills | Status: DC
  Filled 2021-05-14: qty 60, 30d supply, fill #0

## 2021-05-14 MED ORDER — SPIRONOLACTONE 25 MG PO TABS
25.0000 mg | ORAL_TABLET | Freq: Every day | ORAL | 0 refills | Status: DC
  Filled 2021-05-14: qty 30, 30d supply, fill #0

## 2021-05-14 MED ORDER — ISOSORBIDE MONONITRATE ER 60 MG PO TB24: 60.0000 mg | ORAL_TABLET | Freq: Every day | ORAL | Status: DC

## 2021-05-14 MED ORDER — POTASSIUM CHLORIDE CRYS ER 20 MEQ PO TBCR
40.0000 meq | EXTENDED_RELEASE_TABLET | ORAL | Status: AC
  Administered 2021-05-14 (×2): 40 meq via ORAL
  Filled 2021-05-14 (×2): qty 2

## 2021-05-14 NOTE — Progress Notes (Signed)
At bedside to remove PICC per order.   RN at bedside states pt is receiving a NS bolus at this time.   RN to place and IV Team consult when ready for PICC to be removed.

## 2021-05-14 NOTE — TOC Progression Note (Addendum)
Transition of Care [facility]) - Progression Note    Patient Details  Name: NINI CAVAN MRN: [id number] Date of Birth: 09/09/1970  Transition of Care [facility]) CM/SW Contact  Zenon Mayo, RN Phone Number: 05/14/2021, 12:28 PM  Clinical Narrative:    Lucienne Minks with [facility] of dc today. She has orders and patient has his DME. Copay for eliquis is 20.00 next month per [place].   Expected Discharge Plan: [place] Barriers to Discharge: No Barriers Identified  Expected Discharge Plan and Services Expected Discharge Plan: [place] In-house Referral: Clinical Social Work Discharge Planning Services: CM Consult Post Acute Care Choice: [place] arrangements for the past 2 months: Apartment Expected Discharge Date: 05/14/21                 DME Agency: [employer] Date DME Agency Contacted: 05/13/21 Time DME Agency Contacted: 1143   [place]: RN, PT [place] Agency: [place] ([place]) Date [place]: 05/13/21 Time [place]: 1143 Representative spoke with at [place]: Elijio Miles   Social Determinants of Health ([place]) Interventions    Readmission Risk Interventions No flowsheet data found.

## 2021-05-14 NOTE — Progress Notes (Addendum)
Advanced Heart Failure Rounding Note  PCP-Cardiologist: None   Subjective:    11/01: No LAA thrombus on TEE. Unsuccessful DCCV after 3 shocks 11/2: switched to po amio. Started on farxiga and torsemide was increased to 40 mg daily.    CVP 1. Weight down another 4 pounds. Creatinine trending up 1>1.5  Denies SOB.  Marland Kitchen  R/LHC, 10/31 Findings:  Ao = 156/89 (112) LV =135/7 RA = 2 RV = 27/6 PA = 31/8 (20) PCW = 7 Fick cardiac output/index = 5.6/2.5 PVR = 2.2 WU FA sat = 99% PA sat = 67%. 69% SVC sat = 72%  Assessment: 1. Normal coronary arteries 2. 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 Well-compensated hemodynamics after large-volume diuresis  Objective:   Weight Range: 130.6 kg Body mass index is 51 kg/m.   Vital Signs:   Temp:  [97.9 F (36.6 C)-98.8 F (37.1 C)] 98.8 F (37.1 C) (11/03 0729) Pulse Rate:  [87-100] 87 (11/03 0313) Resp:  [18-22] 20 (11/03 0729) BP: (106-149)/(58-98) 117/80 (11/03 0729) SpO2:  [94 %-99 %] 96 % (11/03 0313) Weight:  [130.6 kg] 130.6 kg (11/03 0500) Last BM Date: 05/11/21  Weight change: Filed Weights   05/12/21 1253 05/13/21 0235 05/14/21 0500  Weight: 133.3 kg 132.3 kg 130.6 kg    Intake/Output:   Intake/Output Summary (Last 24 hours) at 05/14/2021 5883 Last data filed at 05/14/2021 0600 Gross per 24 hour  Intake 252.07 ml  Output 2100 ml  Net -1847.93 ml      Physical Exam  CVP 1 personally checked  General:  Well appearing. No resp difficulty HEENT: normal Neck: supple. no JVD. Carotids 2+ bilat; no bruits. No lymphadenopathy or thryomegaly appreciated. Cor: PMI nondisplaced. Irregular rate & rhythm. No rubs, gallops or murmurs. Lungs: clear Abdomen: soft, nontender, nondistended. No hepatosplenomegaly. No bruits or masses. Good bowel sounds. Extremities: no cyanosis, clubbing, rash, R and LLE unna boots.  Neuro: alert & orientedx3, cranial nerves grossly intact. moves all 4  extremities w/o difficulty. Affect pleasant   Telemetry    Afib 90-100s with occasional PVCs.    Labs    CBC Recent Labs    05/13/21 0443 05/14/21 0400  WBC 6.8 8.9  HGB 12.0 11.6*  HCT 38.1 36.5  MCV 94.1 93.4  PLT 399 254   Basic Metabolic Panel Recent Labs    05/13/21 0443 05/14/21 0400  NA 132* 136  K 3.9 3.7  CL 101 105  CO2 26 26  GLUCOSE 199* 125*  BUN 14 25*  CREATININE 0.98 1.50*  CALCIUM 11.5* 11.7*  MG 2.1 2.1   Liver Function Tests No results for input(s): AST, ALT, ALKPHOS, BILITOT, PROT, ALBUMIN in the last 72 hours. No results for input(s): LIPASE, AMYLASE in the last 72 hours. Cardiac Enzymes No results for input(s): CKTOTAL, CKMB, CKMBINDEX, TROPONINI in the last 72 hours.  BNP: BNP (last 3 results) Recent Labs    05/03/21 2349  BNP 670.0*    ProBNP (last 3 results) No results for input(s): PROBNP in the last 8760 hours.   D-Dimer No results for input(s): DDIMER in the last 72 hours. Hemoglobin A1C No results for input(s): HGBA1C in the last 72 hours.  Fasting Lipid Panel No results for input(s): CHOL, HDL, LDLCALC, TRIG, CHOLHDL, LDLDIRECT in the last 72 hours. Thyroid Function Tests No results for input(s): TSH, T4TOTAL, T3FREE, THYROIDAB in the last 72 hours.  Invalid input(s): FREET3  Other results:  Imaging    No results found.   Medications:     Scheduled Medications:  amiodarone  200 mg Oral BID   apixaban  5 mg Oral BID   Chlorhexidine Gluconate Cloth  6 each Topical Daily   dapagliflozin propanediol  10 mg Oral Daily   digoxin  0.125 mg Oral Daily   influenza vac split quadrivalent PF  0.5 mL Intramuscular Tomorrow-1000   isosorbide-hydrALAZINE  1 tablet Oral TID   losartan  100 mg Oral Daily   pneumococcal 23 valent vaccine  0.5 mL Intramuscular Tomorrow-1000   potassium chloride  40 mEq Oral Daily   sodium chloride flush  10-40 mL Intracatheter Q12H   sodium chloride flush  3 mL Intravenous Q12H    spironolactone  25 mg Oral Daily   torsemide  40 mg Oral Daily    Infusions:  sodium chloride      PRN Medications: sodium chloride, acetaminophen, albuterol, ALPRAZolam, benzonatate, docusate sodium, guaiFENesin, hydrocortisone cream, magnesium hydroxide, ondansetron (ZOFRAN) IV, sodium chloride flush, sodium chloride flush    Patient Profile   Christine Oneal is a 50 y.o. female with history of HTN and obesity admitted with atrial fibrillation with RVR and acute systolic HF.   Assessment/Plan  Acute systolic HF with low output/cardiomyopathy: -Suspect etiology likely tachy mediated in setting of AF with RVR. Uncontrolled HTN may also be playing a role. Myocarditis also a consideration. However, troponin flat and just minimally elevated.   -Echo with LVEF < 20%, moderate LVH, moderately reduced RV, severe MR -Now off milrinone -R/LHC with normal coronaries, RA 2, PA 31/8, Fick CI 2.5, PA sat 67% -IV lasix stopped 10/31. Down 32 lb this admit. CVP 1 today. Creatinine bump. Hold torsemide today. Tomorrow start torsemide 20 mg daily.  -Continue Spiro 25 mg daily. -On losartan 100 mg daily (no ARNI d/t hx angioedema with ACEi) -Continue digoxin 0.125 mg daily -Continue bidil 1 tablet TID -Continue Farxiga 10 mg daily -No BB d/t low output HF -Continue unna boots.    2. Atrial fibrillation with RVR: -New diagnosis this admit, suspect duration for several weeks based on onset of symptoms -TSH WNL -CHADS2-VASc score at least 3 (female, HTN, CHF), now on eliquis 5 mg BID -Unsuccessful DCCV 11/01, may reattempt restoration of SR in the future -Continue  amio to 200 mg BID on eliquis 5 mg twice a day.  -Will need formal sleep study as outpatient   3. Mitral regurgitation: -Severe on echo -Suspect functional   4. HTN: Stable.    5. Morbid obesity: -Body mass index is 51 kg/m.  6. Hypokalemia: -K 3.7 . K replaced.  -Mag 2.1. Stable.  -Continue spiro to 25 mg daily   7. Possible LV  thrombus: -On Eliquis  8. AKI Creatinine 1>1.5 with addition of farxiga and torsemide. Suspect she is dry. Holding torsemide as noted above.   She has follow up next week in HF clinic 11/8 at 1000    Length of Stay: [place], NP  05/14/2021, 8:22 AM  Advanced Heart Failure Team Pager [phone] (M-F; 7a - 5p)  Please contact [facility] for night-coverage after hours (5p -7a ) and weekends on [email or web address]    Patient seen and examined with the above-signed Advanced Practice Provider and/or Housestaff. I personally reviewed laboratory data, imaging studies and relevant notes. I independently examined the patient and formulated the important aspects of the plan. I have edited the note to reflect any of my changes or salient points. I have  personally discussed the plan with the patient and/or family.  Much improved. Remains in AF rates in 90s. Denies CP, SOB, orthopnea. CVP 1  General:  Well appearing. No resp difficulty HEENT: normal Neck: supple. no JVD. Carotids 2+ bilat; no bruits. No lymphadenopathy or thryomegaly appreciated. Cor: PMI nondisplaced. Irregular rate & rhythm. No rubs, gallops or murmurs. Lungs: clear Abdomen: soft, nontender, nondistended. No hepatosplenomegaly. No bruits or masses. Good bowel sounds. Extremities: no cyanosis, clubbing, rash, edema Neuro: alert & orientedx3, cranial nerves grossly intact. moves all 4 extremities w/o difficulty. Affect pleasant  She is a bit dry. Give 500cc NS today then ok for d/c. D/c meds reviewed with PharmD. Cut back torsemide to 20 daily.  F/u HF clinic  Glori Bickers, MD  10:13 AM

## 2021-05-14 NOTE — Progress Notes (Signed)
Pt just ambulated with MT. HR up to 140s, which has been typical for her. Reviewed daily wts, signs of fluid, low sodium. Encouraged ambulation at home. [phone] Yves Dill CES, ACSM /12:03 PM 05/14/2021

## 2021-05-14 NOTE — Progress Notes (Signed)
Discharge instructions (including medications) discussed with and copy provided to patient/caregiver 

## 2021-05-14 NOTE — Progress Notes (Signed)
Mobility Specialist Progress Note    05/14/21 1110  Mobility  Activity Ambulated in hall  Level of Assistance Independent after set-up  Assistive Device None  Distance Ambulated (ft) 380 ft  Mobility Ambulated independently in hallway  Mobility Response Tolerated well  Mobility performed by Mobility specialist  $Mobility charge 1 Mobility   During Mobility: 144 HR Post-Mobility: 107 HR  Pt received in bed with RN present and agreeable. No complaints on walk. Got tachy into the 140s at turn around point and again when getting back to bed but had no symptoms. Left with call bell in reach.   Christine Oneal Mobility Specialist  Mobility Specialist Phone: [phone]

## 2021-05-14 NOTE — TOC CM/SW Note (Signed)
HF TOC CM spoke to pt and dtr will be at home to assist her after dc. Pt scheduled for dc on 11/3. Meds will come up from [place] to pt's room prior to dc. [place] arranged with [place]. Pt has RW in room and tub bench will be shipped to her home. [place], Heart Failure TOC CM [phone]

## 2021-05-14 NOTE — Plan of Care (Signed)
  Problem: Education: Goal: Knowledge of General Education information will improve Description: Including pain rating scale, medication(s)/side effects and non-pharmacologic comfort measures Outcome: Adequate for Discharge   

## 2021-05-18 NOTE — Progress Notes (Incomplete)
ADVANCED HF CLINIC CONSULT NOTE  Referring Physician: Primary Care: Primary Cardiologist:  HPI: She presented to ED on 10/23 with complaints of shortness of shortness of breath and cough for at least 3 weeks. Reports multiple family members had been sick and believes she had a "cold". Recently treated for suspected upper respiratory infection with antibiotics after a telemedicine visit on 10/19. She called EMS d/t worsening symptoms. Found to be in afib with rate 150s. She was initiated on heparin drip and IV amiodarone. BNP 670. Troponin flat, 48 > 51. Chest x-ray with evidence of CHF and small bilateral pleural effusions. Resp panel negative for flu and COVID-19. She was admitted under Cardiology service. Initiated on IV lasix.    Echo with LVEF < 20%, akinetic apex with possible early thrombus formation, moderate concentric LVH, RV moderately reduced, severe MR, mild to moderate TR.   Concern for low-output HF yesterday. PICC line placed.She was started on 0.25 milrinone. Diuresing well with TID IV lasix.  R/LHC, 10/31 Findings:  Ao = 156/89 (112) LV =135/7 RA = 2 RV = 27/6 PA = 31/8 (20) PCW = 7 Fick cardiac output/index = 5.6/2.5 PVR = 2.2 WU FA sat = 99% PA sat = 67%. 69% SVC sat = 72%  Assessment: 1. Normal coronary arteries 2. 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 Well-compensated hemodynamics after large-volume diuresis      Review of Systems: [y] = yes, [ ]  = no   General: Weight gain [ ] ; Weight loss [ ] ; Anorexia [ ] ; Fatigue [ ] ; Fever [ ] ; Chills [ ] ; Weakness [ ]   Cardiac: Chest pain/pressure [ ] ; Resting SOB [ ] ; Exertional SOB [ ] ; Orthopnea [ ] ; Pedal Edema [ ] ; Palpitations [ ] ; Syncope [ ] ; Presyncope [ ] ; Paroxysmal nocturnal dyspnea[ ]   Pulmonary: Cough [ ] ; Wheezing[ ] ; Hemoptysis[ ] ; Sputum [ ] ; Snoring [ ]   GI: Vomiting[ ] ; Dysphagia[ ] ; Melena[ ] ; Hematochezia [ ] ; Heartburn[ ] ; Abdominal pain [ ] ; Constipation [ ] ;  Diarrhea [ ] ; BRBPR [ ]   GU: Hematuria[ ] ; Dysuria [ ] ; Nocturia[ ]   Vascular: Pain in legs with walking [ ] ; Pain in feet with lying flat [ ] ; Non-healing sores [ ] ; Stroke [ ] ; TIA [ ] ; Slurred speech [ ] ;  Neuro: Headaches[ ] ; Vertigo[ ] ; Seizures[ ] ; Paresthesias[ ] ;Blurred vision [ ] ; Diplopia [ ] ; Vision changes [ ]   Ortho/Skin: Arthritis [ ] ; Joint pain [ ] ; Muscle pain [ ] ; Joint swelling [ ] ; Back Pain [ ] ; Rash [ ]   Psych: Depression[ ] ; Anxiety[ ]   Heme: Bleeding problems [ ] ; Clotting disorders [ ] ; Anemia [ ]   Endocrine: Diabetes [ ] ; Thyroid dysfunction[ ]    Past Medical History:  Diagnosis Date   Abnormal Pap smear of cervix    Allergy    Anemia    Fibroid    Hypertension    Leukocytosis, unspecified 10/18/2013   Obesity    Plantar fasciitis     Current Outpatient Medications  Medication Sig Dispense Refill   amiodarone (PACERONE) 200 MG tablet Take 1 tablet (200 mg total) by mouth 2 (two) times daily. 60 tablet 0   apixaban (ELIQUIS) 5 MG TABS tablet Take 1 tablet (5 mg total) by mouth 2 (two) times daily. 60 tablet 0   dapagliflozin propanediol (FARXIGA) 10 MG TABS tablet Take 1 tablet (10 mg total) by mouth daily. 30 tablet 0   digoxin (LANOXIN) 0.125 MG tablet  Take 1 tablet (0.125 mg total) by mouth daily. 30 tablet 0   hydrALAZINE (APRESOLINE) 25 MG tablet Take 1.5 tablets (37.5 mg total) by mouth every 8 (eight) hours. 135 tablet 0   isosorbide mononitrate (IMDUR) 60 MG 24 hr tablet Take 1 tablet (60 mg total) by mouth daily. 30 tablet 0   losartan (COZAAR) 100 MG tablet Take 1 tablet (100 mg total) by mouth daily. 30 tablet 0   potassium chloride SA (KLOR-CON) 20 MEQ tablet Take 2 tablets (40 mEq total) by mouth daily. 60 tablet 0   Probiotic CHEW Chew 2 capsules by mouth daily.     spironolactone (ALDACTONE) 25 MG tablet Take 1 tablet (25 mg total) by mouth daily. 30 tablet 0   torsemide (DEMADEX) 20 MG tablet Take 1 tablet (20 mg total) by mouth daily. 30  tablet 0   No current facility-administered medications for this visit.    Allergies  Allergen Reactions   Lisinopril-Hydrochlorothiazide Anaphylaxis   Other Anaphylaxis      Social History   Socioeconomic History   Marital status: Divorced    Spouse name: Not on file   Number of children: 4   Years of education: Not on file   Highest education level: Not on file  Occupational History   Occupation: CNA    Employer: Information systems manager   Tobacco Use   Smoking status: Never   Smokeless tobacco: Never  Vaping Use   Vaping Use: Never used  Substance and Sexual Activity   Alcohol use: No   Drug use: No   Sexual activity: Yes    Partners: Male    Birth control/protection: Surgical    Comment: BTL  Other Topics Concern   Not on file  Social History Narrative   Rhylee has 4 children. Three live at home with her.   Social Determinants of Health   Financial Resource Strain: Not on file  Food Insecurity: Not on file  Transportation Needs: Not on file  Physical Activity: Not on file  Stress: Not on file  Social Connections: Not on file  Intimate Partner Violence: Not on file      Family History  Problem Relation Age of Onset   Hypertension Mother    Cervical cancer Mother    Hypertension Father    Stroke Father    Uterine cancer Maternal Grandmother    Diabetes Sister    Hypertension Brother    Hypertension Brother     There were no vitals filed for this visit.  PHYSICAL EXAM: General:  Well appearing. No respiratory difficulty HEENT: normal Neck: supple. no JVD. Carotids 2+ bilat; no bruits. No lymphadenopathy or thryomegaly appreciated. Cor: PMI nondisplaced. Regular rate & rhythm. No rubs, gallops or murmurs. Lungs: clear Abdomen: soft, nontender, nondistended. No hepatosplenomegaly. No bruits or masses. Good bowel sounds. Extremities: no cyanosis, clubbing, rash, edema Neuro: alert & oriented x 3, cranial nerves grossly intact. moves all 4 extremities w/o  difficulty. Affect pleasant.  ECG:   ASSESSMENT & PLAN:  Acute systolic HF with low output/cardiomyopathy: -Suspect etiology likely tachy mediated in setting of AF with RVR. Uncontrolled HTN may also be playing a role. Myocarditis also a consideration. However, troponin flat and just minimally elevated.   -Echo with LVEF < 20%, moderate LVH, moderately reduced RV, severe MR -Now off milrinone -R/LHC with normal coronaries, RA 2, PA 31/8, Fick CI 2.5, PA sat 67% -IV lasix stopped 10/31. Down 32 lb this admit. CVP 1 today. Creatinine bump. Hold torsemide today.  Tomorrow start torsemide 20 mg daily.  -Continue Spiro 25 mg daily. -On losartan 100 mg daily (no ARNI d/t hx angioedema with ACEi) -Continue digoxin 0.125 mg daily -Continue bidil 1 tablet TID -Continue Farxiga 10 mg daily -No BB d/t low output HF -Continue unna boots.    2. Atrial fibrillation with RVR: -New diagnosis this admit, suspect duration for several weeks based on onset of symptoms -TSH WNL -CHADS2-VASc score at least 3 (female, HTN, CHF), now on eliquis 5 mg BID -Unsuccessful DCCV 11/01, may reattempt restoration of SR in the future -Continue  amio to 200 mg BID on eliquis 5 mg twice a day.  -Will need formal sleep study as outpatient   3. Mitral regurgitation: -Severe on echo -Suspect functional   4. HTN: Stable.    5. Morbid obesity: -Body mass index is 51 kg/m.   6. Hypokalemia: -K 3.7 . K replaced.  -Mag 2.1. Stable.  -Continue spiro to 25 mg daily   7. Possible LV thrombus: -On Eliquis   8. AKI Creatinine 1>1.5 with addition of farxiga and torsemide. Suspect she is dry. Holding torsemide as noted above.    She has follow up next week in HF clinic 11/8 at 1000

## 2021-05-19 ENCOUNTER — Other Ambulatory Visit (HOSPITAL_COMMUNITY): Payer: Self-pay

## 2021-05-19 ENCOUNTER — Encounter (HOSPITAL_COMMUNITY): Payer: Medicaid Other

## 2021-05-22 ENCOUNTER — Telehealth: Payer: Self-pay

## 2021-05-22 NOTE — Telephone Encounter (Signed)
Transition Care Management Follow-up Telephone Call Date of discharge and from where: 05/14/2021[facility] How have you been since you were released from the hospital? " I've been doing pretty good." Any questions or concerns? Yes Patient had concern/ question regarding swelling in lower extremities.  Discussed with patient. Advised to continue to weigh daily and report weight gain of 3 lbs overnight 5 lbs in a week to doctor.  Advised to follow her fluid restriction advisement from her provider. Discussed low salt diet.   Items Reviewed: Did the pt receive and understand the discharge instructions provided? Yes  Medications obtained and verified? Yes  Other? No  Any new allergies since your discharge? No  Dietary orders reviewed? Yes Do you have support at home? Yes   Home Care and Equipment/Supplies: Were home health services ordered? yes If so, what is the name of the agency? [facility]  Has the agency set up a time to come to the patient's home? yes Were any new equipment or medical supplies ordered?  Yes: patient states walker and shower chair given to her What is the name of the medical supply agency? [facility]  Were you able to get the supplies/equipment? yes Do you have any questions related to the use of the equipment or supplies? No  Functional Questionnaire: (I = Independent and D = Dependent) ADLs: I  Bathing/Dressing- I  Meal Prep- I  Eating- I  Maintaining continence- I  Transferring/Ambulation- I  Managing Meds- I  Follow up appointments reviewed:  PCP Hospital f/u appt confirmed? Yes  Scheduled to see Dr. Maximiano Coss on 05/25/2021 @ 1:10pm. [facility] f/u appt confirmed? Yes  Scheduled to see [place] on 05/28/2021 @ 9:00am . Are transportation arrangements needed? No  If their condition worsens, is the pt aware to call PCP or go to the Emergency Dept.? Yes Was the patient provided with contact information for the PCP's office or  ED? Yes Was to pt encouraged to call back with questions or concerns? Yes  Quinn Plowman RN,BSN,CCM RN Case Manager [facility]  [phone]

## 2021-05-25 ENCOUNTER — Ambulatory Visit (INDEPENDENT_AMBULATORY_CARE_PROVIDER_SITE_OTHER): Payer: 59 | Admitting: Registered Nurse

## 2021-05-25 ENCOUNTER — Inpatient Hospital Stay (HOSPITAL_COMMUNITY)
Admission: EM | Admit: 2021-05-25 | Discharge: 2021-05-31 | DRG: 682 | Disposition: A | Discharge status: Home Health | Payer: 59 | Source: Ambulatory Visit | Attending: Internal Medicine | Admitting: Internal Medicine

## 2021-05-25 ENCOUNTER — Encounter (HOSPITAL_COMMUNITY): Payer: Self-pay | Admitting: Emergency Medicine

## 2021-05-25 ENCOUNTER — Telehealth: Payer: Self-pay | Admitting: Internal Medicine

## 2021-05-25 ENCOUNTER — Other Ambulatory Visit: Payer: Self-pay

## 2021-05-25 ENCOUNTER — Encounter: Payer: Self-pay | Admitting: Registered Nurse

## 2021-05-25 DIAGNOSIS — Z6841 Body Mass Index (BMI) 40.0 and over, adult: Secondary | ICD-10-CM

## 2021-05-25 DIAGNOSIS — Z888 Allergy status to other drugs, medicaments and biological substances status: Secondary | ICD-10-CM

## 2021-05-25 DIAGNOSIS — Z79899 Other long term (current) drug therapy: Secondary | ICD-10-CM

## 2021-05-25 DIAGNOSIS — I509 Heart failure, unspecified: Secondary | ICD-10-CM | POA: Diagnosis not present

## 2021-05-25 DIAGNOSIS — N179 Acute kidney failure, unspecified: Secondary | ICD-10-CM | POA: Diagnosis not present

## 2021-05-25 DIAGNOSIS — I5023 Acute on chronic systolic (congestive) heart failure: Secondary | ICD-10-CM | POA: Diagnosis present

## 2021-05-25 DIAGNOSIS — Z7901 Long term (current) use of anticoagulants: Secondary | ICD-10-CM

## 2021-05-25 DIAGNOSIS — Z20822 Contact with and (suspected) exposure to covid-19: Secondary | ICD-10-CM | POA: Diagnosis present

## 2021-05-25 DIAGNOSIS — D75839 Thrombocytosis, unspecified: Secondary | ICD-10-CM | POA: Diagnosis present

## 2021-05-25 DIAGNOSIS — Z8249 Family history of ischemic heart disease and other diseases of the circulatory system: Secondary | ICD-10-CM

## 2021-05-25 DIAGNOSIS — I4819 Other persistent atrial fibrillation: Secondary | ICD-10-CM | POA: Diagnosis present

## 2021-05-25 DIAGNOSIS — Z09 Encounter for follow-up examination after completed treatment for conditions other than malignant neoplasm: Secondary | ICD-10-CM

## 2021-05-25 DIAGNOSIS — I10 Essential (primary) hypertension: Secondary | ICD-10-CM | POA: Diagnosis present

## 2021-05-25 DIAGNOSIS — I428 Other cardiomyopathies: Secondary | ICD-10-CM | POA: Diagnosis present

## 2021-05-25 DIAGNOSIS — E210 Primary hyperparathyroidism: Secondary | ICD-10-CM | POA: Diagnosis present

## 2021-05-25 DIAGNOSIS — K5900 Constipation, unspecified: Secondary | ICD-10-CM | POA: Diagnosis present

## 2021-05-25 DIAGNOSIS — E871 Hypo-osmolality and hyponatremia: Secondary | ICD-10-CM | POA: Diagnosis present

## 2021-05-25 DIAGNOSIS — Z87892 Personal history of anaphylaxis: Secondary | ICD-10-CM

## 2021-05-25 DIAGNOSIS — I4892 Unspecified atrial flutter: Secondary | ICD-10-CM | POA: Diagnosis present

## 2021-05-25 DIAGNOSIS — Z9851 Tubal ligation status: Secondary | ICD-10-CM

## 2021-05-25 DIAGNOSIS — E876 Hypokalemia: Secondary | ICD-10-CM | POA: Diagnosis present

## 2021-05-25 DIAGNOSIS — I110 Hypertensive heart disease with heart failure: Secondary | ICD-10-CM | POA: Diagnosis present

## 2021-05-25 DIAGNOSIS — I5022 Chronic systolic (congestive) heart failure: Secondary | ICD-10-CM | POA: Diagnosis present

## 2021-05-25 DIAGNOSIS — I4891 Unspecified atrial fibrillation: Secondary | ICD-10-CM | POA: Diagnosis present

## 2021-05-25 DIAGNOSIS — E869 Volume depletion, unspecified: Secondary | ICD-10-CM | POA: Diagnosis present

## 2021-05-25 HISTORY — DX: Heart failure, unspecified: I50.9

## 2021-05-25 LAB — CBC WITH DIFFERENTIAL/PLATELET
Abs Immature Granulocytes: 0.02 10*3/uL (ref 0.00–0.07)
Basophils Absolute: 0 10*3/uL (ref 0.0–0.1)
Basophils Relative: 0 %
Eosinophils Absolute: 0.1 10*3/uL (ref 0.0–0.5)
Eosinophils Relative: 1 %
HCT: 37.8 % (ref 36.0–46.0)
Hemoglobin: 12.3 g/dL (ref 12.0–15.0)
Immature Granulocytes: 0 %
Lymphocytes Relative: 23 %
Lymphs Abs: 1.9 10*3/uL (ref 0.7–4.0)
MCH: 30.8 pg (ref 26.0–34.0)
MCHC: 32.5 g/dL (ref 30.0–36.0)
MCV: 94.5 fL (ref 80.0–100.0)
Monocytes Absolute: 0.9 10*3/uL (ref 0.1–1.0)
Monocytes Relative: 10 %
Neutro Abs: 5.5 10*3/uL (ref 1.7–7.7)
Neutrophils Relative %: 66 %
Platelets: 460 10*3/uL — ABNORMAL HIGH (ref 150–400)
RBC: 4 MIL/uL (ref 3.87–5.11)
RDW: 14.6 % (ref 11.5–15.5)
WBC: 8.4 10*3/uL (ref 4.0–10.5)
nRBC: 0 % (ref 0.0–0.2)

## 2021-05-25 LAB — COMPREHENSIVE METABOLIC PANEL
ALT: 22 U/L (ref 0–35)
ALT: 25 U/L (ref 0–44)
AST: 19 U/L (ref 0–37)
AST: 21 U/L (ref 15–41)
Albumin: 4.3 g/dL (ref 3.5–5.2)
Albumin: 3.9 g/dL (ref 3.5–5.0)
Alkaline Phosphatase: 105 U/L (ref 39–117)
Alkaline Phosphatase: 89 U/L (ref 38–126)
Anion gap: 9 (ref 5–15)
BUN: 29 mg/dL — ABNORMAL HIGH (ref 6–23)
BUN: 26 mg/dL — ABNORMAL HIGH (ref 6–20)
CO2: 27 mEq/L (ref 19–32)
CO2: 25 mmol/L (ref 22–32)
Calcium: 13.9 mg/dL (ref 8.4–10.5)
Calcium: 13.5 mg/dL (ref 8.9–10.3)
Chloride: 104 mEq/L (ref 96–112)
Chloride: 106 mmol/L (ref 98–111)
Creatinine, Ser: 1.65 mg/dL — ABNORMAL HIGH (ref 0.40–1.20)
Creatinine, Ser: 1.63 mg/dL — ABNORMAL HIGH (ref 0.44–1.00)
GFR, Estimated: 38 mL/min — ABNORMAL LOW (ref >60)
GFR: 35.68 mL/min — ABNORMAL LOW (ref >60.00)
Glucose, Bld: 96 mg/dL (ref 70–99)
Glucose, Bld: 101 mg/dL — ABNORMAL HIGH (ref 70–99)
Potassium: 4.9 mEq/L (ref 3.5–5.1)
Potassium: 4.2 mmol/L (ref 3.5–5.1)
Sodium: 140 mEq/L (ref 135–145)
Sodium: 140 mmol/L (ref 135–145)
Total Bilirubin: 1 mg/dL (ref 0.2–1.2)
Total Bilirubin: 0.7 mg/dL (ref 0.3–1.2)
Total Protein: 8.1 g/dL (ref 6.0–8.3)
Total Protein: 8.2 g/dL — ABNORMAL HIGH (ref 6.5–8.1)

## 2021-05-25 LAB — LIPID PANEL
Cholesterol: 193 mg/dL (ref 0–200)
HDL: 47.6 mg/dL (ref >39.00)
LDL Cholesterol: 125 mg/dL — ABNORMAL HIGH (ref 0–99)
NonHDL: 144.96
Total CHOL/HDL Ratio: 4
Triglycerides: 102 mg/dL (ref 0.0–149.0)
VLDL: 20.4 mg/dL (ref 0.0–40.0)

## 2021-05-25 NOTE — Patient Instructions (Addendum)
Christine Oneal -   What a busy few weeks it's been for you -  I do want to repeat some labs to ensure stability, in particular, ensuring renal function is steady. We'll know these results by tomorrow.  Otherwise, labs from the hospital were generally good by discharge.  Follow up with cardiology as scheduled - I'll be following along as they will CC me on the charts.   See you in a few months, sooner if you need anything!  Thanks,  Rich     If you have lab work done today you will be contacted with your lab results within the next 2 weeks.  If you have not heard from [place] then please contact us. The fastest way to get your results is to register for My Chart.   IF you received an x-ray today, you will receive an invoice from [facility]. Please contact [facility] at [phone] with questions or concerns regarding your invoice.   IF you received labwork today, you will receive an invoice from [facility]. Please contact [facility] at [phone] with questions or concerns regarding your invoice.   Our billing staff will not be able to assist you with questions regarding bills from these companies.  You will be contacted with the lab results as soon as they are available. The fastest way to get your results is to activate your My Chart account. Instructions are located on the last page of this paperwork. If you have not heard from [place] regarding the results in 2 weeks, please contact this office.

## 2021-05-25 NOTE — Progress Notes (Addendum)
Established Patient Office Visit  Subjective:  Patient ID: Christine Oneal, female    DOB: Oct 26, 1970  Age: 50 y.o. MRN: [id number]  CC:  Chief Complaint  Patient presents with   Hospitalization Follow-up    Patient states she is here for a HFU patient states she was having a shortness of breath and was diagnosed with heart failure. Want to discuss medications as well.     HPI [name] presents for HFU  Presented on 05/03/21 with shob and cough ongoing x 3 weeks. Had been seen via E-visit outside of [facility] for suspected viral upper respiratory illness, then with abx for suspected bacterial component with z pack.  On presentation, found in afib with HR 150s.  Started IV amiodarone. BNP 670, stable troponin. CXR showed stable CHF. Admitted under Cards service, AHF consulted.  Echo showed LVF <20%, moderate reduction in RV, severe MR.  Cardiomyopathy felt to be tachymediated in setting of AF with uncontrolled HTN. IV lasix to diurese then switched to po torsemide.  Spironolactone, losartan, imdur/hydralazine, farxiga, and digoxin started.  Unsuccessful Dccv. Increase amiodarone to 200mg  po bid. Anticoagulated with eliquis.   Has been coordinated with [facility]. Doing well with this Given 4 wheel rolling walker with seat, shower bench. Using regularly. No falls.   No acute concerns at today's visit. Feeling stable since d/c. She will have follow up with Cardiology APP on 05/28/21.  On review of labs last BMET in hospital showed elevated glucose to 125, GFR dec to 42, Cr to 1.50. fortunately A1c was 5.8. CBC stable. Digoxin level subtherapeutic. Magnesium wnl. Will recheck metabolic panel today.   Past Medical History:  Diagnosis Date   Abnormal Pap smear of cervix    Allergy    Anemia    Fibroid    Hypertension    Leukocytosis, unspecified 10/18/2013   Obesity    Plantar fasciitis     Past Surgical History:  Procedure Laterality Date   CARDIOVERSION N/A 05/12/2021    Procedure: CARDIOVERSION;  Surgeon: Jolaine Artist, MD;  Location: [facility] ENDOSCOPY;  Service: Cardiovascular;  Laterality: N/A;   COLPOSCOPY     RIGHT/LEFT HEART CATH AND CORONARY ANGIOGRAPHY N/A 05/11/2021   Procedure: RIGHT/LEFT HEART CATH AND CORONARY ANGIOGRAPHY;  Surgeon: Jolaine Artist, MD;  Location: [place] CV LAB;  Service: Cardiovascular;  Laterality: N/A;   TEE WITHOUT CARDIOVERSION N/A 05/12/2021   Procedure: TRANSESOPHAGEAL ECHOCARDIOGRAM (TEE);  Surgeon: Jolaine Artist, MD;  Location: [facility] ENDOSCOPY;  Service: Cardiovascular;  Laterality: N/A;   TUBAL LIGATION  2001    Family History  Problem Relation Age of Onset   Hypertension Mother    Cervical cancer Mother    Hypertension Father    Stroke Father    Uterine cancer Maternal Grandmother    Diabetes Sister    Hypertension Brother    Hypertension Brother     Social History   Socioeconomic History   Marital status: Divorced    Spouse name: Not on file   Number of children: 4   Years of education: Not on file   Highest education level: Not on file  Occupational History   Occupation: CNA    Employer: Information systems manager   Tobacco Use   Smoking status: Never   Smokeless tobacco: Never  Vaping Use   Vaping Use: Never used  Substance and Sexual Activity   Alcohol use: No   Drug use: No   Sexual activity: Yes    Partners: Male  Birth control/protection: Surgical    Comment: BTL  Other Topics Concern   Not on file  Social History Narrative   [name] has 4 children. Three live at home with her.   Social Determinants of Health   Financial Resource Strain: Not on file  Food Insecurity: Not on file  Transportation Needs: Not on file  Physical Activity: Not on file  Stress: Not on file  Social Connections: Not on file  Intimate Partner Violence: Not on file    No facility-administered medications prior to visit.   Outpatient Medications Prior to Visit  Medication Sig Dispense Refill   amiodarone  (PACERONE) 200 MG tablet Take 1 tablet (200 mg total) by mouth 2 (two) times daily. 60 tablet 0   apixaban (ELIQUIS) 5 MG TABS tablet Take 1 tablet (5 mg total) by mouth 2 (two) times daily. 60 tablet 0   dapagliflozin propanediol (FARXIGA) 10 MG TABS tablet Take 1 tablet (10 mg total) by mouth daily. 30 tablet 0   digoxin (LANOXIN) 0.125 MG tablet Take 1 tablet (0.125 mg total) by mouth daily. 30 tablet 0   hydrALAZINE (APRESOLINE) 25 MG tablet Take 1.5 tablets (37.5 mg total) by mouth every 8 (eight) hours. 135 tablet 0   isosorbide mononitrate (IMDUR) 60 MG 24 hr tablet Take 1 tablet (60 mg total) by mouth daily. 30 tablet 0   losartan (COZAAR) 100 MG tablet Take 1 tablet (100 mg total) by mouth daily. 30 tablet 0   potassium chloride SA (KLOR-CON) 20 MEQ tablet Take 2 tablets (40 mEq total) by mouth daily. 60 tablet 0   Probiotic CHEW Chew 2 capsules by mouth daily.     spironolactone (ALDACTONE) 25 MG tablet Take 1 tablet (25 mg total) by mouth daily. 30 tablet 0   torsemide (DEMADEX) 20 MG tablet Take 1 tablet (20 mg total) by mouth daily. 30 tablet 0    Allergies  Allergen Reactions   Lisinopril-Hydrochlorothiazide Anaphylaxis   Other Anaphylaxis    ROS Review of Systems  Constitutional: Negative.   HENT: Negative.    Eyes: Negative.   Respiratory: Negative.    Cardiovascular: Negative.   Gastrointestinal: Negative.   Genitourinary: Negative.   Musculoskeletal: Negative.   Skin: Negative.   Neurological: Negative.   Psychiatric/Behavioral: Negative.    All other systems reviewed and are negative.    Objective:    Physical Exam Vitals and nursing note reviewed.  Constitutional:      General: She is not in acute distress.    Appearance: Normal appearance. She is normal weight. She is not ill-appearing, toxic-appearing or diaphoretic.  Cardiovascular:     Rate and Rhythm: Normal rate and regular rhythm.     Heart sounds: Normal heart sounds. No murmur heard.   No  friction rub. No gallop.  Pulmonary:     Effort: Pulmonary effort is normal. No respiratory distress.     Breath sounds: Normal breath sounds. No stridor. No wheezing, rhonchi or rales.  Chest:     Chest wall: No tenderness.  Skin:    General: Skin is warm and dry.  Neurological:     General: No focal deficit present.     Mental Status: She is alert and oriented to person, place, and time. Mental status is at baseline.  Psychiatric:        Mood and Affect: Mood normal.        Behavior: Behavior normal.        Thought Content: Thought content normal.  Judgment: Judgment normal.    BP 132/78   Pulse 78   Temp 97.6 F (36.4 C) (Temporal)   Resp 18   Ht 5\' 3"  (1.6 m)   Wt 282 lb 12.8 oz (128.3 kg)   LMP 05/01/2021 (Exact Date)   SpO2 99%   BMI 50.10 kg/m  Wt Readings from Last 3 Encounters:  05/25/21 282 lb (127.9 kg)  05/25/21 282 lb 12.8 oz (128.3 kg)  05/14/21 287 lb 14.7 oz (130.6 kg)     Health Maintenance Due  Topic Date Due   COVID-19 Vaccine (1) Never done   COLONOSCOPY (Pts 45-38yrs Insurance coverage will need to be confirmed)  02/28/2017   MAMMOGRAM  Never done   Zoster Vaccines- Shingrix (1 of 2) Never done   PAP SMEAR-Modifier  06/16/2020    There are no preventive care reminders to display for this patient.  Lab Results  Component Value Date   TSH 3.322 05/26/2021   Lab Results  Component Value Date   WBC 8.4 05/25/2021   HGB 12.3 05/25/2021   HCT 37.8 05/25/2021   MCV 94.5 05/25/2021   PLT 460 (H) 05/25/2021   Lab Results  Component Value Date   NA 134 (L) 05/26/2021   K 4.1 05/26/2021   CO2 23 05/26/2021   GLUCOSE 144 (H) 05/26/2021   BUN 20 05/26/2021   CREATININE 1.28 (H) 05/26/2021   BILITOT 0.7 05/25/2021   ALKPHOS 89 05/25/2021   AST 21 05/25/2021   ALT 25 05/25/2021   PROT 8.2 (H) 05/25/2021   ALBUMIN 3.9 05/25/2021   CALCIUM 12.4 (H) 05/26/2021   ANIONGAP 7 05/26/2021   GFR 35.68 (L) 05/25/2021   Lab Results   Component Value Date   CHOL 193 05/25/2021   Lab Results  Component Value Date   HDL 47.60 05/25/2021   Lab Results  Component Value Date   LDLCALC 125 (H) 05/25/2021   Lab Results  Component Value Date   TRIG 102.0 05/25/2021   Lab Results  Component Value Date   CHOLHDL 4 05/25/2021   Lab Results  Component Value Date   HGBA1C 5.8 (H) 05/06/2021      Assessment & Plan:   Problem List Items Addressed This Visit       Cardiovascular and Mediastinum   Acute congestive heart failure ([place])     Other   BMI 50.0-59.9, adult ([place]) - Primary   Other Visit Diagnoses     Hospital discharge follow-up       Morbid obesity ([place])           No orders of the defined types were placed in this encounter.   Follow-up: Return in about 3 months (around 08/25/2021) for CHf follow up.   PLAN Labs collected. Will follow up with the patient as warranted. Will monitor calcium levels on labs to decide if further intervention necessary. Return in 3 mo for med check Follow up with cardiology as scheduled Reinforced lifestyle modifications for best control of chronic conditions Patient encouraged to call clinic with any questions, comments, or concerns.  Maximiano Coss, NP

## 2021-05-25 NOTE — Telephone Encounter (Signed)
Called by [facility] with critical calcium level of 13.9  Chart reviewed and patient called;  she denies any history of thiazides, lithium , vitamin D and calcium.  Recently hospitalized,  seen today for hospital follow up.  Calcium level was  repeatedly mildly elevated during hospitalization but not worked up or even mentioned in hospital discharge.  Given the jump from 11.7 to 13.9 I have advised patient to go to ER to have it rechecked

## 2021-05-25 NOTE — ED Provider Notes (Signed)
Emergency Medicine Provider Triage Evaluation Note  Christine Oneal , a 50 y.o. female  was evaluated in triage.  Patient was seen at her PCP on Thursday and they called her with results today to say that should her calcium was too high.  She was recently diagnosed with atrial fibrillation, never had symptoms associated with this. She was in atrial flutter per EMS. She has no complaints at this time.   Review of Systems  Positive: None  Negative: Chest pain, SOB, abdominal pain, palpitations, lightheadedness, headache  Physical Exam  LMP 05/01/2021 (Exact Date)  Gen:   Awake, no distress   Resp:  Normal effort  MSK:   Moves extremities without difficulty  Other:    Medical Decision Making  Medically screening exam initiated at 8:59 PM.  Appropriate orders placed.  CAELEN HIGINBOTHAM was informed that the remainder of the evaluation will be completed by another provider, this initial triage assessment does not replace that evaluation, and the importance of remaining in the ED until their evaluation is complete.     Estill Cotta 05/25/21 2059    Hayden Rasmussen, MD 05/25/21 [id number]

## 2021-05-25 NOTE — ED Triage Notes (Signed)
Patient here from home, PCP called and stated she had his Ca and she is in atrial flutter per EMS.  Patient denies shortness of breath or chest pain.  Patient with recent diagnosis of Afib.

## 2021-05-26 ENCOUNTER — Emergency Department (HOSPITAL_COMMUNITY): Payer: 59

## 2021-05-26 DIAGNOSIS — I5020 Unspecified systolic (congestive) heart failure: Secondary | ICD-10-CM

## 2021-05-26 DIAGNOSIS — E210 Primary hyperparathyroidism: Secondary | ICD-10-CM | POA: Diagnosis present

## 2021-05-26 DIAGNOSIS — I5023 Acute on chronic systolic (congestive) heart failure: Secondary | ICD-10-CM | POA: Diagnosis present

## 2021-05-26 DIAGNOSIS — Z6841 Body Mass Index (BMI) 40.0 and over, adult: Secondary | ICD-10-CM | POA: Diagnosis not present

## 2021-05-26 DIAGNOSIS — K5900 Constipation, unspecified: Secondary | ICD-10-CM | POA: Diagnosis present

## 2021-05-26 DIAGNOSIS — Z9851 Tubal ligation status: Secondary | ICD-10-CM | POA: Diagnosis not present

## 2021-05-26 DIAGNOSIS — I5022 Chronic systolic (congestive) heart failure: Secondary | ICD-10-CM | POA: Diagnosis not present

## 2021-05-26 DIAGNOSIS — E871 Hypo-osmolality and hyponatremia: Secondary | ICD-10-CM | POA: Diagnosis present

## 2021-05-26 DIAGNOSIS — D75839 Thrombocytosis, unspecified: Secondary | ICD-10-CM | POA: Diagnosis present

## 2021-05-26 DIAGNOSIS — E876 Hypokalemia: Secondary | ICD-10-CM | POA: Diagnosis present

## 2021-05-26 DIAGNOSIS — Z7901 Long term (current) use of anticoagulants: Secondary | ICD-10-CM | POA: Diagnosis not present

## 2021-05-26 DIAGNOSIS — I4892 Unspecified atrial flutter: Secondary | ICD-10-CM | POA: Diagnosis present

## 2021-05-26 DIAGNOSIS — E869 Volume depletion, unspecified: Secondary | ICD-10-CM | POA: Diagnosis present

## 2021-05-26 DIAGNOSIS — I4891 Unspecified atrial fibrillation: Secondary | ICD-10-CM | POA: Diagnosis not present

## 2021-05-26 DIAGNOSIS — Z8249 Family history of ischemic heart disease and other diseases of the circulatory system: Secondary | ICD-10-CM | POA: Diagnosis not present

## 2021-05-26 DIAGNOSIS — N179 Acute kidney failure, unspecified: Secondary | ICD-10-CM | POA: Diagnosis present

## 2021-05-26 DIAGNOSIS — Z888 Allergy status to other drugs, medicaments and biological substances status: Secondary | ICD-10-CM | POA: Diagnosis not present

## 2021-05-26 DIAGNOSIS — Z20822 Contact with and (suspected) exposure to covid-19: Secondary | ICD-10-CM | POA: Diagnosis present

## 2021-05-26 DIAGNOSIS — I480 Paroxysmal atrial fibrillation: Secondary | ICD-10-CM

## 2021-05-26 DIAGNOSIS — I4819 Other persistent atrial fibrillation: Secondary | ICD-10-CM | POA: Diagnosis present

## 2021-05-26 DIAGNOSIS — I10 Essential (primary) hypertension: Secondary | ICD-10-CM | POA: Diagnosis not present

## 2021-05-26 DIAGNOSIS — I428 Other cardiomyopathies: Secondary | ICD-10-CM | POA: Diagnosis present

## 2021-05-26 DIAGNOSIS — Z87892 Personal history of anaphylaxis: Secondary | ICD-10-CM | POA: Diagnosis not present

## 2021-05-26 DIAGNOSIS — I110 Hypertensive heart disease with heart failure: Secondary | ICD-10-CM | POA: Diagnosis present

## 2021-05-26 DIAGNOSIS — Z79899 Other long term (current) drug therapy: Secondary | ICD-10-CM | POA: Diagnosis not present

## 2021-05-26 LAB — BASIC METABOLIC PANEL
Anion gap: 7 (ref 5–15)
BUN: 20 mg/dL (ref 6–20)
CO2: 23 mmol/L (ref 22–32)
Calcium: 12.8 mg/dL — ABNORMAL HIGH (ref 8.9–10.3)
Chloride: 104 mmol/L (ref 98–111)
Creatinine, Ser: 1.28 mg/dL — ABNORMAL HIGH (ref 0.44–1.00)
GFR, Estimated: 51 mL/min — ABNORMAL LOW (ref >60)
Glucose, Bld: 144 mg/dL — ABNORMAL HIGH (ref 70–99)
Potassium: 4.1 mmol/L (ref 3.5–5.1)
Sodium: 134 mmol/L — ABNORMAL LOW (ref 135–145)

## 2021-05-26 LAB — MAGNESIUM: Magnesium: 2.7 mg/dL — ABNORMAL HIGH (ref 1.7–2.4)

## 2021-05-26 LAB — RESP PANEL BY RT-PCR (FLU A&B, COVID) ARPGX2
Influenza A by PCR: NEGATIVE
Influenza B by PCR: NEGATIVE
SARS Coronavirus 2 by RT PCR: NEGATIVE

## 2021-05-26 LAB — VITAMIN D 25 HYDROXY (VIT D DEFICIENCY, FRACTURES): Vit D, 25-Hydroxy: 16.19 ng/mL — ABNORMAL LOW (ref 30–100)

## 2021-05-26 LAB — CALCIUM: Calcium: 12.4 mg/dL — ABNORMAL HIGH (ref 8.9–10.3)

## 2021-05-26 LAB — TSH: TSH: 3.322 u[IU]/mL (ref 0.350–4.500)

## 2021-05-26 LAB — PHOSPHORUS: Phosphorus: 2.1 mg/dL — ABNORMAL LOW (ref 2.5–4.6)

## 2021-05-26 MED ORDER — ONDANSETRON HCL 4 MG PO TABS: 4.0000 mg | ORAL_TABLET | Freq: Four times a day (QID) | ORAL | Status: DC | PRN

## 2021-05-26 MED ORDER — ONDANSETRON HCL 4 MG/2ML IJ SOLN: 4.0000 mg | Freq: Four times a day (QID) | INTRAMUSCULAR | Status: DC | PRN

## 2021-05-26 MED ORDER — ACETAMINOPHEN 650 MG RE SUPP: 650.0000 mg | Freq: Four times a day (QID) | RECTAL | Status: DC | PRN

## 2021-05-26 MED ORDER — AMIODARONE HCL 200 MG PO TABS
200.0000 mg | ORAL_TABLET | Freq: Two times a day (BID) | ORAL | Status: DC
  Administered 2021-05-26: 200 mg via ORAL
  Filled 2021-05-26: qty 1

## 2021-05-26 MED ORDER — HYDRALAZINE HCL 25 MG PO TABS
37.5000 mg | ORAL_TABLET | Freq: Three times a day (TID) | ORAL | Status: DC
  Administered 2021-05-26 – 2021-05-31 (×17): 37.5 mg via ORAL
  Filled 2021-05-26 (×17): qty 2

## 2021-05-26 MED ORDER — AMIODARONE HCL IN DEXTROSE 360-4.14 MG/200ML-% IV SOLN
30.0000 mg/h | INTRAVENOUS | Status: DC
  Administered 2021-05-26 – 2021-05-30 (×9): 30 mg/h via INTRAVENOUS
  Filled 2021-05-26 (×9): qty 200

## 2021-05-26 MED ORDER — ACETAMINOPHEN 325 MG PO TABS
650.0000 mg | ORAL_TABLET | Freq: Four times a day (QID) | ORAL | Status: DC | PRN
  Administered 2021-05-26 – 2021-05-27 (×2): 650 mg via ORAL
  Filled 2021-05-26 (×2): qty 2

## 2021-05-26 MED ORDER — APIXABAN 5 MG PO TABS
5.0000 mg | ORAL_TABLET | Freq: Two times a day (BID) | ORAL | Status: DC
  Administered 2021-05-26 – 2021-05-31 (×11): 5 mg via ORAL
  Filled 2021-05-26 (×11): qty 1

## 2021-05-26 MED ORDER — ISOSORBIDE MONONITRATE ER 60 MG PO TB24
60.0000 mg | ORAL_TABLET | Freq: Every day | ORAL | Status: DC
  Administered 2021-05-26 – 2021-05-31 (×6): 60 mg via ORAL
  Filled 2021-05-26 (×4): qty 1
  Filled 2021-05-26: qty 2
  Filled 2021-05-26: qty 1

## 2021-05-26 MED ORDER — SODIUM CHLORIDE 0.9 % IV SOLN
90.0000 mg | Freq: Once | INTRAVENOUS | Status: AC
  Administered 2021-05-26: 90 mg via INTRAVENOUS
  Filled 2021-05-26: qty 10

## 2021-05-26 MED ORDER — ZOLEDRONIC ACID 4 MG/5ML IV CONC: 4.0000 mg | Freq: Once | INTRAVENOUS | Status: DC

## 2021-05-26 MED ORDER — LACTATED RINGERS IV SOLN: INTRAVENOUS | Status: DC

## 2021-05-26 NOTE — H&P (Signed)
History and Physical    Christine Oneal:[id number] DOB: 14-Feb-1971 DOA: 05/25/2021  PCP: Maximiano Coss, NP  Patient coming from: Home  I have personally briefly reviewed patient's old medical records in [place]  Chief Complaint: Hypercalcemia  HPI: Christine Oneal is a 50 y.o. female with medical history significant of HTN, recently diagnosed HFrEF with EF 20%, also A.Fib.  Pt just hospitalized earlier this month.  Started on numerous meds and diuretics, discharged.  Pt went to PCP for follow up.  Having some constipation and palpitations (a.Fib), but no other new symptoms.  At PCP labs showed hypercalcemia and AKI.  Pt sent in to ED.  No CP, SOB, decreased UOP, abd pain, lethargy, AMS.   ED Course: Pt with calcium 13.5 and creat 1.6 up from 0.8 baseline.  Remains in a.fib, rate 108.   Review of Systems: As per HPI, otherwise all review of systems negative.  Past Medical History:  Diagnosis Date   Abnormal Pap smear of cervix    Allergy    Anemia    Fibroid    Hypertension    Leukocytosis, unspecified 10/18/2013   Obesity    Plantar fasciitis     Past Surgical History:  Procedure Laterality Date   CARDIOVERSION N/A 05/12/2021   Procedure: CARDIOVERSION;  Surgeon: Jolaine Artist, MD;  Location: [facility] ENDOSCOPY;  Service: Cardiovascular;  Laterality: N/A;   COLPOSCOPY     RIGHT/LEFT HEART CATH AND CORONARY ANGIOGRAPHY N/A 05/11/2021   Procedure: RIGHT/LEFT HEART CATH AND CORONARY ANGIOGRAPHY;  Surgeon: Jolaine Artist, MD;  Location: [place] CV LAB;  Service: Cardiovascular;  Laterality: N/A;   TEE WITHOUT CARDIOVERSION N/A 05/12/2021   Procedure: TRANSESOPHAGEAL ECHOCARDIOGRAM (TEE);  Surgeon: Jolaine Artist, MD;  Location: [facility] ENDOSCOPY;  Service: Cardiovascular;  Laterality: N/A;   TUBAL LIGATION  2001     reports that she has never smoked. She has never used smokeless tobacco. She reports that she does not drink alcohol and does not  use drugs.  Allergies  Allergen Reactions   Lisinopril-Hydrochlorothiazide Anaphylaxis   Other Anaphylaxis    Family History  Problem Relation Age of Onset   Hypertension Mother    Cervical cancer Mother    Hypertension Father    Stroke Father    Uterine cancer Maternal Grandmother    Diabetes Sister    Hypertension Brother    Hypertension Brother      Prior to Admission medications   Medication Sig Start Date End Date Taking? Authorizing Provider  acetaminophen (TYLENOL) 500 MG tablet Take 1,000 mg by mouth every 6 (six) hours as needed for moderate pain or headache.   Yes [provider]  amiodarone (PACERONE) 200 MG tablet Take 1 tablet (200 mg total) by mouth 2 (two) times daily. 05/14/21  Yes Joette Catching, PA-C  apixaban (ELIQUIS) 5 MG TABS tablet Take 1 tablet (5 mg total) by mouth 2 (two) times daily. 05/14/21  Yes Joette Catching, PA-C  dapagliflozin propanediol (FARXIGA) 10 MG TABS tablet Take 1 tablet (10 mg total) by mouth daily. 05/15/21  Yes Joette Catching, PA-C  digoxin (LANOXIN) 0.125 MG tablet Take 1 tablet (0.125 mg total) by mouth daily. 05/15/21  Yes Joette Catching, PA-C  hydrALAZINE (APRESOLINE) 25 MG tablet Take 1.5 tablets (37.5 mg total) by mouth every 8 (eight) hours. 05/14/21  Yes Joette Catching, PA-C  isosorbide mononitrate (IMDUR) 60 MG 24 hr tablet Take 1 tablet (60 mg total) by mouth  daily. 05/15/21  Yes Joette Catching, PA-C  losartan (COZAAR) 100 MG tablet Take 1 tablet (100 mg total) by mouth daily. 05/15/21  Yes Joette Catching, PA-C  potassium chloride SA (KLOR-CON) 20 MEQ tablet Take 2 tablets (40 mEq total) by mouth daily. 05/15/21  Yes Joette Catching, PA-C  Probiotic CHEW Chew 2 capsules by mouth daily.   Yes [provider]  spironolactone (ALDACTONE) 25 MG tablet Take 1 tablet (25 mg total) by mouth daily. 05/15/21  Yes Joette Catching, PA-C  torsemide (DEMADEX) 20 MG tablet  Take 1 tablet (20 mg total) by mouth daily. 05/16/21  Yes Joette Catching, [place]    Physical Exam: Vitals:   05/26/21 0230 05/26/21 0345 05/26/21 0430 05/26/21 0515  BP: 121/76 113/70 124/69 (!) 133/94  Pulse: 78 91 75 85  Resp: 19 16 (!) 22 (!) 25  Temp:      TempSrc:      SpO2: 95% 99% 99% 99%  Weight:      Height:        Constitutional: NAD, calm, comfortable Eyes: PERRL, lids and conjunctivae normal ENMT: Mucous membranes are moist. Posterior pharynx clear of any exudate or lesions.Normal dentition.  Neck: normal, supple, no masses, no thyromegaly Respiratory: clear to auscultation bilaterally, no wheezing, no crackles. Normal respiratory effort. No accessory muscle use.  Cardiovascular: Irr, Irr, trace edema Abdomen: no tenderness, no masses palpated. No hepatosplenomegaly. Bowel sounds positive.  Musculoskeletal: no clubbing / cyanosis. No joint deformity upper and lower extremities. Good ROM, no contractures. Normal muscle tone.  Skin: no rashes, lesions, ulcers. No induration Neurologic: CN 2-12 grossly intact. Sensation intact, DTR normal. Strength 5/5 in all 4.  Psychiatric: Normal judgment and insight. Alert and oriented x 3. Normal mood.    Labs on Admission: I have personally reviewed following labs and imaging studies  CBC: Recent Labs  Lab 05/25/21 2100  WBC 8.4  NEUTROABS 5.5  HGB 12.3  HCT 37.8  MCV 94.5  PLT 315*   Basic Metabolic Panel: Recent Labs  Lab 05/25/21 1414 05/25/21 2100  NA 140 140  K 4.9 4.2  CL 104 106  CO2 27 25  GLUCOSE 96 101*  BUN 29* 26*  CREATININE 1.65* 1.63*  CALCIUM 13.9* 13.5*   GFR: Estimated Creatinine Clearance: 53.2 mL/min (A) (by C-G formula based on SCr of 1.63 mg/dL (H)). Liver Function Tests: Recent Labs  Lab 05/25/21 1414 05/25/21 2100  AST 19 21  ALT 22 25  ALKPHOS 105 89  BILITOT 1.0 0.7  PROT 8.1 8.2*  ALBUMIN 4.3 3.9   No results for input(s): LIPASE, AMYLASE in the last 168 hours. No  results for input(s): AMMONIA in the last 168 hours. Coagulation Profile: No results for input(s): INR, PROTIME in the last 168 hours. Cardiac Enzymes: No results for input(s): CKTOTAL, CKMB, CKMBINDEX, TROPONINI in the last 168 hours. BNP (last 3 results) No results for input(s): PROBNP in the last 8760 hours. HbA1C: No results for input(s): HGBA1C in the last 72 hours. CBG: No results for input(s): GLUCAP in the last 168 hours. Lipid Profile: Recent Labs    05/25/21 1414  CHOL 193  HDL 47.60  LDLCALC 125*  TRIG 102.0  CHOLHDL 4   Thyroid Function Tests: No results for input(s): TSH, T4TOTAL, FREET4, T3FREE, THYROIDAB in the last 72 hours. Anemia Panel: No results for input(s): VITAMINB12, FOLATE, FERRITIN, TIBC, IRON, RETICCTPCT in the last 72 hours. Urine analysis:    Component Value Date/Time  COLORURINE YELLOW 04/02/2018 1949   APPEARANCEUR CLOUDY (A) 04/02/2018 1949   LABSPEC 1.013 04/02/2018 1949   PHURINE 8.0 04/02/2018 1949   GLUCOSEU NEGATIVE 04/02/2018 1949   HGBUR NEGATIVE 04/02/2018 1949   [place] NEGATIVE 04/02/2018 1949   KETONESUR NEGATIVE 04/02/2018 1949   PROTEINUR NEGATIVE 04/02/2018 1949   UROBILINOGEN 1.0 03/25/2018 1723   NITRITE NEGATIVE 04/02/2018 1949   LEUKOCYTESUR NEGATIVE 04/02/2018 1949    Radiological Exams on Admission: No results found.  EKG: Independently reviewed.  Assessment/Plan Principal Problem:   Hypercalcemia Active Problems:   Essential hypertension   Unspecified atrial fibrillation (HCC)   Chronic systolic CHF (congestive heart failure) (HCC)   AKI (acute kidney injury) (HCC)    Hypercalcemia - EDP spoke with cards, they recd holding diuretics, doing IVF at no more than 2L in 24h IVF: NS at 100 cc/hr Will hold diuretics for now; however, may want to consider restarting loop diuretic to increase calcium excretion. Pt not on thiazide diuretic at baseline to explain her hypercalcemia. Regarding cause of  hypercalcemia: Wilder Glade has had case reports of causing this EDP informs me that aldactone can very rarely cause hypercalcemia (dont see this in its known side effects though). Check PTH Check PTHrp Check CXR Hold off on full body CT scan, though if work up un-revealing, this would be next step to look for occult malignancy. Tele monitor Check calcium Q12H Pamidronate being given Dont really have indication at this point for calcitonin as mental status is baseline, calcium < 14. AKI - Hold losartan Hold diuretics Gentle hydration Strict intake and output Repeat BMP in AM A.Fib - Tele monitor Cont amiodarone Hold digoxin (hypercalcemia increases risk of digoxin toxicity). Consider BB if HR increases? Cont eliquis Chronic systolic CHF - Holding diuretics Holding ARB Cont imdur Cont hydralazine  DVT prophylaxis: Eliquis Code Status: Full Family Communication: No family in room Disposition Plan: Home after AKI improved and calcium improved Consults called: None Admission status: Place in 46    Shalom Mcguiness, [place] [facility]  How to contact the [facility] Attending or Consulting provider [place] or covering provider during after hours [place], for this patient?  Check the care team in [facility] and look for a) attending/consulting [facility] provider listed and b) the [facility] team listed Log into [email or web address]  Amion Physician Scheduling and messaging for groups and whole hospitals  On call and physician scheduling software for group practices, residents, hospitalists and other medical providers for call, clinic, rotation and shift schedules. On[employer] is a hospital-wide system for scheduling doctors and paging doctors on call. EasyPlot is for scientific plotting and data analysis.  [email or web address]  and use [place]'s universal password to access. If you do not have the password, please contact the hospital operator.  Locate the [facility] provider you are looking for under [facility] and page to a number that you can be directly reached. If you still have difficulty reaching the provider, please page the [facility] (Director on Call) for the Hospitalists listed on amion for assistance.  05/26/2021, 5:41 AM

## 2021-05-26 NOTE — Consult Note (Signed)
Cardiology Consultation:   Patient ID: TEGAN BRITAIN MRN: [id number]; DOB: 09/21/1970  Admit date: 05/25/2021 Date of Consult: 05/26/2021  PCP:  Maximiano Coss, NP   [facility] Providers Cardiologist:  None        Patient Profile:   JENAVEVE FENSTERMAKER is a 50 y.o. female with a hx of NICM (EF <20%), A. fib/a flutter (on apixaban), HTN, and obesity who is being seen 05/26/2021 for the evaluation of hypercalcemia in the setting of HFrEF at the request of [place].  History of Present Illness:   Ms. Wiersma was recently hospitalized last month at [facility] for newly diagnosed decompensated HFrEF.  She underwent LHC which was negative for obstructive coronary disease, so it was thought that her HFrEF secondary to tachycardia in the setting of atrial fibrillation +/- uncontrolled hypertension.  She underwent aggressive diuresis and started on GDMT.  She underwent DCCV but it was unsuccessful in cardioverting her despite being loaded with amiodarone.  She was discharged on 11/3 torsemide 20 mg daily and spironolactone 25 mg daily.  Is important note that her creatinine bump to 1.5 at the time of discharge so torsemide was held until 11/5.  She continue her recovery at home and follow-up with her PCP yesterday for hospital follow-up.  As a part of this routine labs were collected and she returned home.  She received a call stating that her labs were abnormal and that her calcium was elevated and was instructed to come to the ED for further evaluation.  The patient endorses having constipation, but denies all other symptoms.  She denies chest pain, shortness of breath, syncope, presyncope, diarrhea, nausea, vomiting, urinary symptoms, weakness, or numbness.  She does report taking double of her torsemide on Monday morning due to concerns with increasing lower extremity edema, but otherwise is taking all her medications as prescribed.  In the ED her VS were stable but she was noted to be  in atrial flutter with variable block.  Labs notable for AKI to 1.6 and serum calcium of 13.9.  Cardiology is consulted to assist with fluid management for hypercalcemia in the setting of heart failure.   Past Medical History:  Diagnosis Date   Abnormal Pap smear of cervix    Allergy    Anemia    Fibroid    Hypertension    Leukocytosis, unspecified 10/18/2013   Obesity    Plantar fasciitis     Past Surgical History:  Procedure Laterality Date   CARDIOVERSION N/A 05/12/2021   Procedure: CARDIOVERSION;  Surgeon: Jolaine Artist, MD;  Location: [facility] ENDOSCOPY;  Service: Cardiovascular;  Laterality: N/A;   COLPOSCOPY     RIGHT/LEFT HEART CATH AND CORONARY ANGIOGRAPHY N/A 05/11/2021   Procedure: RIGHT/LEFT HEART CATH AND CORONARY ANGIOGRAPHY;  Surgeon: Jolaine Artist, MD;  Location: [place] CV LAB;  Service: Cardiovascular;  Laterality: N/A;   TEE WITHOUT CARDIOVERSION N/A 05/12/2021   Procedure: TRANSESOPHAGEAL ECHOCARDIOGRAM (TEE);  Surgeon: Jolaine Artist, MD;  Location: [facility] ENDOSCOPY;  Service: Cardiovascular;  Laterality: N/A;   TUBAL LIGATION  2001     Home Medications:  Prior to Admission medications   Medication Sig Start Date End Date Taking? Authorizing Provider  acetaminophen (TYLENOL) 500 MG tablet Take 1,000 mg by mouth every 6 (six) hours as needed for moderate pain or headache.   Yes [provider]  amiodarone (PACERONE) 200 MG tablet Take 1 tablet (200 mg total) by mouth 2 (two) times daily. 05/14/21  Yes Marlyce Huge  Elmyra Ricks, PA-C  apixaban (ELIQUIS) 5 MG TABS tablet Take 1 tablet (5 mg total) by mouth 2 (two) times daily. 05/14/21  Yes Joette Catching, PA-C  dapagliflozin propanediol (FARXIGA) 10 MG TABS tablet Take 1 tablet (10 mg total) by mouth daily. 05/15/21  Yes Joette Catching, PA-C  digoxin (LANOXIN) 0.125 MG tablet Take 1 tablet (0.125 mg total) by mouth daily. 05/15/21  Yes Joette Catching, PA-C  hydrALAZINE (APRESOLINE)  25 MG tablet Take 1.5 tablets (37.5 mg total) by mouth every 8 (eight) hours. 05/14/21  Yes Joette Catching, PA-C  isosorbide mononitrate (IMDUR) 60 MG 24 hr tablet Take 1 tablet (60 mg total) by mouth daily. 05/15/21  Yes Joette Catching, PA-C  losartan (COZAAR) 100 MG tablet Take 1 tablet (100 mg total) by mouth daily. 05/15/21  Yes Joette Catching, PA-C  potassium chloride SA (KLOR-CON) 20 MEQ tablet Take 2 tablets (40 mEq total) by mouth daily. 05/15/21  Yes Joette Catching, PA-C  Probiotic CHEW Chew 2 capsules by mouth daily.   Yes [provider]  spironolactone (ALDACTONE) 25 MG tablet Take 1 tablet (25 mg total) by mouth daily. 05/15/21  Yes Joette Catching, PA-C  torsemide (DEMADEX) 20 MG tablet Take 1 tablet (20 mg total) by mouth daily. 05/16/21  Yes Joette Catching, PA-C    Inpatient Medications: Scheduled Meds:  amiodarone  200 mg Oral BID   apixaban  5 mg Oral BID   hydrALAZINE  37.5 mg Oral Q8H   isosorbide mononitrate  60 mg Oral Daily   Continuous Infusions:  lactated ringers 100 mL/hr at 05/26/21 0506   pamidronate 90 mg (05/26/21 0603)   PRN Meds: acetaminophen **OR** acetaminophen, ondansetron **OR** ondansetron (ZOFRAN) IV  Allergies:    Allergies  Allergen Reactions   Lisinopril-Hydrochlorothiazide Anaphylaxis   Other Anaphylaxis    Social History:   Social History   Socioeconomic History   Marital status: Divorced    Spouse name: Not on file   Number of children: 4   Years of education: Not on file   Highest education level: Not on file  Occupational History   Occupation: CNA    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 Three live at home with her.   Social Determinants of Health   Financial Resource Strain: Not on file  Food Insecurity: Not on file  Transportation Needs: Not on file  Physical Activity: Not on file  Stress: Not on file  Social Connections: Not on file  Intimate Partner Violence: Not on file    Family History:    Family History  Problem Relation Age of Onset   Hypertension Mother    Cervical cancer Mother    Hypertension Father    Stroke Father    Uterine cancer Maternal Grandmother    Diabetes Sister    Hypertension Brother    Hypertension Brother      ROS:  Please see the history of present illness.   All other ROS reviewed and negative.     Physical Exam/Data:  Vitals:   05/26/21 0345 05/26/21 0430 05/26/21 0515 05/26/21 0600  BP: 113/70 124/69 (!) 133/94 126/72  Pulse: 91 75 85 (!) 108  Resp: 16 (!) 22 (!) 25 14  Temp:      TempSrc:      SpO2: 99% 99% 99% 99%  Weight:      Height:       No intake or output data in the 24 hours ending 05/26/21 0733 Last 3 Weights 05/25/2021 05/25/2021 05/14/2021  Weight (lbs) 282 lb 282 lb 12.8 oz 287 lb 14.7 oz  Weight (kg) 127.914 kg 128.277 kg 130.6 kg     Body mass index is 49.95 kg/m.  General:  Well nourished, well developed, in no acute distress, very pleasant HEENT: normal Neck: no JVD Vascular: No carotid bruits; Distal pulses 2+ bilaterally Cardiac:  normal S1, S2; RRR; no murmur  Lungs: Bibasilar Rales, no wheezing, or rhonchi Abd: soft, nontender, no hepatomegaly, obese Ext: Very trace bilateral edema Musculoskeletal:  No deformities, BUE and BLE strength normal and equal Skin: warm and dry  Neuro:  CNs 2-12 intact, no focal abnormalities noted Psych:  Normal affect   EKG:  The EKG was personally reviewed and demonstrates: Atrial flutter with variable block Telemetry:  Telemetry was personally reviewed and demonstrates: Atrial flutter with variable  block  Relevant CV Studies:  LHC 05/11/21:  Assessment: 1. Normal coronary arteries 2. 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 Well-compensated hemodynamics after large-volume diuresis   TTE 05/04/21:  IMPRESSIONS     1. With Definity contrast, the very large papilarry muscles are  visualized. The apex is akinetic. There is possible early thrombus  formmation in the apex. . Left ventricular ejection fraction, by  estimation, is <20%. The left ventricle has severely  decreased function. The left ventricle demonstrates global hypokinesis.  The left ventricular internal cavity size was moderately dilated. There is  moderate concentric left ventricular hypertrophy. Left ventricular  diastolic function could not be  evaluated.   2. Right ventricular systolic function is moderately reduced. The right  ventricular size is moderately enlarged.   3. Left atrial size was mildly dilated.   4. Right atrial size was mildly dilated.   5. The mitral valve is grossly normal. Severe mitral valve regurgitation.   6. Tricuspid valve regurgitation is mild to moderate.   7. The aortic valve is grossly normal. Aortic valve regurgitation is  mild. No aortic stenosis is present.   Laboratory Data:  High Sensitivity Troponin:   Recent Labs  Lab 05/04/21 0007 05/04/21 0149  TROPONINIHS 48* 51*     Chemistry Recent Labs  Lab 05/25/21 1414 05/25/21 2100 05/26/21 0457  NA 140 140  --   K 4.9 4.2  --   CL 104 106  --   CO2 27 25  --   GLUCOSE 96 101*  --   BUN 29* 26*  --   CREATININE 1.65* 1.63*  --   CALCIUM 13.9* 13.5*  --   MG  --   --  2.7*  GFRNONAA  --  38*  --   ANIONGAP  --  9  --     Recent Labs  Lab 05/25/21 1414 05/25/21 2100  PROT 8.1 8.2*  ALBUMIN 4.3 3.9  AST 19 21  ALT 22 25  ALKPHOS 105 89  BILITOT 1.0 0.7   Lipids  Recent Labs  Lab 05/25/21 1414  CHOL 193  TRIG 102.0  HDL  47.60  LDLCALC 125*  CHOLHDL 4     Hematology Recent Labs  Lab 05/25/21 2100  WBC 8.4  RBC 4.00  HGB 12.3  HCT 37.8  MCV 94.5  MCH 30.8  MCHC 32.5  RDW 14.6  PLT 460*   Thyroid  Recent Labs  Lab 05/26/21 0457  TSH 3.322    BNPNo results for input(s): BNP, PROBNP in the last 168 hours.  DDimer No results for input(s): DDIMER in the last 168 hours.   Radiology/Studies:  DG CHEST PORT 1 VIEW  Result Date: 05/26/2021 CLINICAL DATA:  Hypercalcemia. EXAM: PORTABLE CHEST 1 VIEW COMPARISON:  05/05/2021 FINDINGS: 0504 hours. The lungs are clear without focal pneumonia, edema, pneumothorax or pleural effusion. Cardiopericardial silhouette is at upper limits of normal for size. The visualized bony structures of the thorax show no acute abnormality. Telemetry leads overlie the chest. IMPRESSION: No active disease. Electronically Signed   By: Misty Stanley M.D.   On: 05/26/2021 05:46     Assessment and Plan:   TYRIANA HELMKAMP is a 50 y.o. female with a hx of NICM (EF <20%), A. fib/a flutter (on apixaban), HTN, and obesity who is being seen 05/26/2021 for the evaluation of hypercalcemia in the setting of HFrEF at the request of [place].  #Hypercalcemia #HFrEF (EF <20%) :: Patient noted to be hypercalcemic on hospital follow-up labs along with an AKI.  Cardiology is asked to weigh in on volume resuscitation in the setting of hypercalcemia.  I will certainly defer overall treatment to the primary team regarding hypercalcemia; however, given her severely reduced EF I would try to limit her volume resuscitation to no more than 2 L daily.  Would also consider alternatives to reducing her calcium if she becomes symptomatic.  In the setting of her AKI I recommend holding her home torsemide and spironolactone.  Further work-up for hypercalcemia per primary team -Continue hydralazine and Imdur -Hold spironolactone and torsemide -Strict I's and O's -Daily weights  #Aflutter With Variable Block :: Still in atrial  flutter because she had underwent unsuccessful DCCV last hospitalization.  I do wonder if it was unsuccessful due to her volume status not being optimized (I am not sure).  It may be reasonable to retry cardioversion while inpatient.  I would not recommend attempting DCCV at this time however given her hypercalcemia and electrolyte derangements. -Continue apixaban 5 mg twice daily -Consider DCCV later this admission.   Risk Assessment/Risk Scores:        New York Heart Association (NYHA) Functional Class NYHA Class I  CHA2DS2-VASc Score = 3   This indicates a 3.2% annual risk of stroke. The patient's score is based upon:           For questions or updates, please contact [place] Please consult [email or web address] for contact info under    Signed, Hershal Coria, MD  05/26/2021 7:33 AM

## 2021-05-26 NOTE — Consult Note (Addendum)
Advanced Heart Failure Team Consult Note   Primary Physician: Maximiano Coss, NP PCP-Cardiologist:  Dr. Haroldine Laws   Reason for Consultation: Atrial Fibrillation w/ RVR and Systolic Heart Failure   HPI:    Christine Oneal is seen today for evaluation of atrial fibrillation w/ RVR and systolic heart failure at the request of Dr. Benny Lennert, Internal Medicine.   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  Recently admitted from 10/23- 11/3 w/ new atrial fibrillation and CHF. She was admitted under Cardiology service and AHF consulted. D/t concern for low-output HF she was started on IV milrinone. Echo with LVEF < 20%, moderate LVH, moderately reduced RV, severe MR. 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 R/LHC 10/31 with normal coronary arteries, RA 2, PA 31/8, Fick CI 2.5, PA sat 67%. Diuresed with IV lasix then transitioned to po torsemide, weight down total of 31 lb. D/C weight 287 lb.  Initiated GDMT with spiro, losartan, imdur/hydralazine (no bidil d/t cost), farxiga and digoxin.     Unsuccessful DCCV despite three shocks on 11/01. IV amio switched to po 200 mg BID. Anticoagulated with Eliquis.    Day of discharge Scr bumped from 1 > 1.5, CVP 1. Given 500 cc bolus of NS. Torsemide held w/ intent to restart on 11/05 at 20 mg daily.  Pt had post hospital f/u w/ her PCP on 11/14 and complained of constipation and palpitations. Labs showed severe hypercalcemia and AKI. Ca 13.9. SCr 1.65 (baseline 0.9). Referred to the ED for admission.   Remains in persistent Afib, V-rates 120s-130s.  CXR shows no active disease. She reports 2 lb wt gain since discharge. No significant dyspnea. Diuretics, losartan, Farxia and digoxin on hold for AKI.     Review of Systems: [y] = yes, _0  = no   General: Weight gain _1 ; Weight loss _2 ; Anorexia _3 ; Fatigue _4 ; Fever _5 ; Chills _6 ; Weakness _7    Cardiac: Chest pain/pressure _8 ; Resting SOB _9 ; Exertional SOB _10 ; Orthopnea _11 ; Pedal Edema _12 ; Palpitations [ Y]; Syncope _13 ; Presyncope _14 ; Paroxysmal nocturnal dyspnea_15   Pulmonary: Cough _16 ; Wheezing_17 ; Hemoptysis_18 ; Sputum _19 ; Snoring _20   GI: Vomiting_21 ; Dysphagia_22 ; Melena_23 ; Hematochezia _24 ; Heartburn_25 ; Abdominal pain _26 ; Constipation [Y ]; Diarrhea _27 ; BRBPR _28   GU: Hematuria_29 ; Dysuria _30 ; Nocturia_31   Vascular: Pain in legs with walking _32 ; Pain in feet with lying flat _33 ; Non-healing sores _34 ; Stroke _35 ; TIA _36 ; Slurred speech _37 ;  Neuro: Headaches_38 ; Vertigo_39 ; Seizures_40 ; Paresthesias_41 ;Blurred vision _42 ; Diplopia _43 ; Vision changes _44   Ortho/Skin: Arthritis _45 ; Joint pain _46 ; Muscle pain _47 ; Joint swelling _48 ; Back Pain _49 ; Rash _50   Psych: Depression_51 ; Anxiety_52   Heme: Bleeding problems _53 ; Clotting disorders _54 ; Anemia _55   Endocrine: Diabetes _56 ; Thyroid dysfunction_57   Home Medications Prior to Admission medications   Medication Sig Start Date End Date Taking? Authorizing Provider  acetaminophen (TYLENOL) 500 MG tablet Take 1,000 mg by mouth every 6 (six) hours as needed for moderate pain or headache.   Yes [provider]  amiodarone (PACERONE) 200 MG tablet Take 1 tablet (200 mg total) by mouth 2 (two)  times daily. 05/14/21  Yes Joette Catching, PA-C  apixaban (ELIQUIS) 5 MG TABS tablet Take 1 tablet (5 mg total) by mouth 2 (two) times daily. 05/14/21  Yes Joette Catching, PA-C  dapagliflozin propanediol (FARXIGA) 10 MG TABS tablet Take 1 tablet (10 mg total) by mouth daily. 05/15/21  Yes Joette Catching, PA-C  digoxin (LANOXIN) 0.125 MG tablet Take 1 tablet (0.125 mg total) by mouth daily. 05/15/21  Yes Joette Catching, PA-C  hydrALAZINE (APRESOLINE) 25 MG tablet Take 1.5 tablets (37.5 mg total) by mouth every 8 (eight) hours. 05/14/21  Yes Joette Catching, PA-C  isosorbide mononitrate  (IMDUR) 60 MG 24 hr tablet Take 1 tablet (60 mg total) by mouth daily. 05/15/21  Yes Joette Catching, PA-C  losartan (COZAAR) 100 MG tablet Take 1 tablet (100 mg total) by mouth daily. 05/15/21  Yes Joette Catching, PA-C  potassium chloride SA (KLOR-CON) 20 MEQ tablet Take 2 tablets (40 mEq total) by mouth daily. 05/15/21  Yes Joette Catching, PA-C  Probiotic CHEW Chew 2 capsules by mouth daily.   Yes [provider]  spironolactone (ALDACTONE) 25 MG tablet Take 1 tablet (25 mg total) by mouth daily. 05/15/21  Yes Joette Catching, PA-C  torsemide (DEMADEX) 20 MG tablet Take 1 tablet (20 mg total) by mouth daily. 05/16/21  Yes Joette Catching, PA-C    Past Medical History: Past Medical History:  Diagnosis Date   Abnormal Pap smear of cervix    Allergy    Anemia    Fibroid    Hypertension    Leukocytosis, unspecified 10/18/2013   Obesity    Plantar fasciitis     Past Surgical History: Past Surgical History:  Procedure Laterality Date   CARDIOVERSION N/A 05/12/2021   Procedure: CARDIOVERSION;  Surgeon: Jolaine Artist, MD;  Location: [facility] ENDOSCOPY;  Service: Cardiovascular;  Laterality: N/A;   COLPOSCOPY     RIGHT/LEFT HEART CATH AND CORONARY ANGIOGRAPHY N/A 05/11/2021   Procedure: RIGHT/LEFT HEART CATH AND CORONARY ANGIOGRAPHY;  Surgeon: Jolaine Artist, MD;  Location: [place] CV LAB;  Service: Cardiovascular;  Laterality: N/A;   TEE WITHOUT CARDIOVERSION N/A 05/12/2021   Procedure: TRANSESOPHAGEAL ECHOCARDIOGRAM (TEE);  Surgeon: Jolaine Artist, MD;  Location: [facility] ENDOSCOPY;  Service: Cardiovascular;  Laterality: N/A;   TUBAL LIGATION  2001    Family History: Family History  Problem Relation Age of Onset   Hypertension Mother    Cervical cancer Mother    Hypertension Father    Stroke Father    Uterine cancer Maternal Grandmother    Diabetes Sister    Hypertension Brother    Hypertension Brother     Social History: Social  History   Socioeconomic History   Marital status: Divorced    Spouse name: Not on file   Number of children: 4   Years of education: Not on file   Highest education level: Not on file  Occupational History   Occupation: CNA    Employer: Information systems manager   Tobacco Use   Smoking status: Never   Smokeless tobacco: Never  Vaping Use   Vaping Use: Never used  Substance and Sexual Activity   Alcohol use: No   Drug use: No   Sexual activity: Yes    Partners: Male    Birth control/protection: Surgical    Comment: BTL  Other Topics Concern   Not on file  Social History Narrative   Jimi has 4 children. Three live at home with  her.   Social Determinants of Health   Financial Resource Strain: Not on file  Food Insecurity: Not on file  Transportation Needs: Not on file  Physical Activity: Not on file  Stress: Not on file  Social Connections: Not on file    Allergies:  Allergies  Allergen Reactions   Lisinopril-Hydrochlorothiazide Anaphylaxis   Other Anaphylaxis    Objective:    Vital Signs:   Temp:  [98.3 F (36.8 C)] 98.3 F (36.8 C) (11/14 2238) Pulse Rate:  [74-108] 108 (11/15 0900) Resp:  [12-25] 18 (11/15 0900) BP: (112-154)/(69-94) 120/87 (11/15 0900) SpO2:  [95 %-100 %] 98 % (11/15 0900) Weight:  [127.9 kg] 127.9 kg (11/14 2346)    Weight change: Filed Weights   05/25/21 2346  Weight: 127.9 kg    Intake/Output:  No intake or output data in the 24 hours ending 05/26/21 1017    Physical Exam    General:  Well appearing. No resp difficulty HEENT: normal Neck: supple. JVP . Carotids 2+ bilat; no bruits. No lymphadenopathy or thyromegaly appreciated. Cor: PMI nondisplaced. Regular rate & rhythm. No rubs, gallops or murmurs. Lungs: clear Abdomen: soft, nontender, nondistended. No hepatosplenomegaly. No bruits or masses. Good bowel sounds. Extremities: no cyanosis, clubbing, rash, edema Neuro: alert & orientedx3, cranial nerves grossly intact. moves all 4  extremities w/o difficulty. Affect pleasant   Telemetry   Atrial Fibrillation 120s-130s   EKG    3:1 AFL 93 bpm   Labs   Basic Metabolic Panel: Recent Labs  Lab 05/25/21 1414 05/25/21 2100 05/26/21 0457  NA 140 140  --   K 4.9 4.2  --   CL 104 106  --   CO2 27 25  --   GLUCOSE 96 101*  --   BUN 29* 26*  --   CREATININE 1.65* 1.63*  --   CALCIUM 13.9* 13.5*  --   MG  --   --  2.7*  PHOS  --   --  2.1*    Liver Function Tests: Recent Labs  Lab 05/25/21 1414 05/25/21 2100  AST 19 21  ALT 22 25  ALKPHOS 105 89  BILITOT 1.0 0.7  PROT 8.1 8.2*  ALBUMIN 4.3 3.9   No results for input(s): LIPASE, AMYLASE in the last 168 hours. No results for input(s): AMMONIA in the last 168 hours.  CBC: Recent Labs  Lab 05/25/21 2100  WBC 8.4  NEUTROABS 5.5  HGB 12.3  HCT 37.8  MCV 94.5  PLT 460*    Cardiac Enzymes: No results for input(s): CKTOTAL, CKMB, CKMBINDEX, TROPONINI in the last 168 hours.  BNP: BNP (last 3 results) Recent Labs    05/03/21 2349  BNP 670.0*    ProBNP (last 3 results) No results for input(s): PROBNP in the last 8760 hours.   CBG: No results for input(s): GLUCAP in the last 168 hours.  Coagulation Studies: No results for input(s): LABPROT, INR in the last 72 hours.   Imaging   DG CHEST PORT 1 VIEW  Result Date: 05/26/2021 CLINICAL DATA:  Hypercalcemia. EXAM: PORTABLE CHEST 1 VIEW COMPARISON:  05/05/2021 FINDINGS: 0504 hours. The lungs are clear without focal pneumonia, edema, pneumothorax or pleural effusion. Cardiopericardial silhouette is at upper limits of normal for size. The visualized bony structures of the thorax show no acute abnormality. Telemetry leads overlie the chest. IMPRESSION: No active disease. Electronically Signed   By: Misty Stanley M.D.   On: 05/26/2021 05:46     Medications:  Current Medications:  amiodarone  200 mg Oral BID   apixaban  5 mg Oral BID   hydrALAZINE  37.5 mg Oral Q8H   isosorbide  mononitrate  60 mg Oral Daily    Infusions:  lactated ringers 100 mL/hr at 05/26/21 0506      Assessment/Plan   Hypercalcemia - Ca 13.9  - IM conducting w/u. Checking PTH, PTHrp and CXR - Holding Farxiga and spiro  - Consider bone scan. ? Multiple myeloma  2. AKI  - Baseline SCr 0.9 - SCr 1.65 on admit - holding diuretics. Fargixa, losartan, Arlyce Harman and digoxin  - gentle IVF hydration  - BP ok, no hypotension  - follow BMP   3. Atrial Fibrillation/ Flutter w/ RVR  - persistent, diagnosed last admit 10/22 - Unsuccessful DCCV 11/01 despite 3 attempts - TSH normal - on po amio 200 BID w/ rates now 130s   - switch to IV amio - hold dig w/ AKI and hypercalcemia  - Eliquis 5 mg bid   4. Chronic Systolic Heart Failure  -Echo 10/22 with LVEF < 20%, moderate LVH, moderately reduced RV, severe MR -R/LHC previous admit with normal coronaries, RA 2, PA 31/8, Fick CI 2.5, PA sat 67% -Suspect etiology likely tachy mediated in setting of AF with RVR. Uncontrolled HTN may also be playing a role. Myocarditis also a consideration. However, troponin previous admit flat and just minimally elevated.   - Given hypercalcemia, consider w/u for amyloid/ multiple myeloma panel. cMRI once AKI resolves - She does not tolerate rapid afib/FL. Start amio gtt for rate control - volume status ok. W/ AKI may be a little dry. Allow gentle hydration w/ IVFs - hold diuretics, losartan, spiro, Farxiga and digoxin w/ AKI  - continue hydral/imur but monitor BP w/ rapid Afib    Length of Stay: 0  Brittainy Simmons, PA-C  05/26/2021, 10:17 AM  Advanced Heart Failure Team Pager [phone] (M-F; 7a - 5p)  Please contact [facility] for night-coverage after hours (4p -7a ) and weekends on [email or web address]  Patient seen with PA, agree with the above note.  She was recently in the hospital with acute systolic CHF in the setting of atrial fibrillation with RVR.  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  She was diuresed and  DCCV was attempted but failed.  She was sent home on amiodarone in rate-controlled AF with hope to repeat DCCV after further amiodarone loading.   She was doing well at home, says that dyspnea was much improved.  However, PCP drew labs yesterday and noted AKI with creatinine 0.98 => 1.62 and hypercalcemia with Ca 13.9.  Of note, she had mild hypercalcemia starting on 05/10/21 but in the 10's range.  She was admitted and started on NS 100 cc/hr and given pamidronate IV.    No history of malignancy, CXR clear, she has not been in a thiazide diuretic, no Ca supplement or vitamin D at home.   Currently in AF rate 100s-110s.   General: NAD Neck: JVP 8 cm, no thyromegaly or thyroid nodule.  Lungs: Clear to auscultation bilaterally with normal respiratory effort. CV: Nondisplaced PMI.  Heart mildly tachy, irregular S1/S2, no S3/S4, no murmur.  Trace ankle edema.  No carotid bruit.  Normal pedal pulses.  Abdomen: Soft, nontender, no hepatosplenomegaly, no distention.  Skin: Intact without lesions or rashes.  Neurologic: Alert and oriented x 3.  Psych: Normal affect. Extremities: No clubbing or cyanosis.  HEENT: Normal.   1. Hypercalcemia: Ultimate cause uncertain.  No thiazide diuretic, no  Ca supplement or vitamin D, no known malignancy and clear CXR.  Suspect there is an underlying predisposition to hypercalcemia with precipitation of marked hypercalcemia by aggressive diuresis/volume depletion/AKI.   - She has received pamidronate IV, last Ca 13.5 so think we can hold off on calcitriol.  - Agree with IV normal saline 100 cc/hr.  Would limit to 1 L then reassess volume status and calcium.  If Ca still significantly elevated, can given NS along with Lasix IV to prevent development of volume overload.  - Need workup for hyperparathyroidism => check PTH.   - Less likely malignancy but will send 1,25 hydroxyvitamin D level, PTHrp, myeloma panel, and urine immunofixation.  2. AKI: Creatinine 0.98  baseline => 1.63 in setting of diuresis with acute systolic CHF.  She does not look volume overloaded on exam today.  - Hold torsemide, gentle hydration as above.  3. Chronic systolic CHF: Nonischemic CMP, ?tachy-mediated.  Last study was TEE in 11/22 showing E 25-30%, normal RV, mild MR.  She is not significantly volume overloaded on exam, has AKI.  - Hold torsemide as above.  - With AKI, hold losartan, spironolactone, Farxiga.  - Increased risk digoxin toxicity with hyperCa, hold digoxin.  - Continue hydralazine/Imdur for now.  4. Atrial fibrillation with RVR: Failed DCCV last admission.  Has been on amiodarone. . - Transition to amiodarone gtt for rate control.  - Continue Eliquis.   - Now that she has loaded more amiodarone, consider repeat DCCV this admission.   Loralie Champagne 05/26/2021 11:43 AM

## 2021-05-26 NOTE — ED Notes (Signed)
PT ambulated to bathroom with steady gait

## 2021-05-26 NOTE — ED Notes (Signed)
Cardiology at bedside.

## 2021-05-26 NOTE — ED Provider Notes (Signed)
[facility] EMERGENCY DEPARTMENT Provider Note   CSN: [id number] Arrival date & time: 05/25/21  2044     History Chief Complaint  Patient presents with   Abnormal Lab   Atrial Fibrillation    Christine Oneal is a 50 y.o. female who presents the prompting of her primary care doctor with concern for hypercalcemia noted on routine laboratory studies.  Patient states that she has been having some constipation, however this is not new for her.  Additional endorses sensation of palpitations with exertion today but states they resolved at rest and she has not had a recurrence since this afternoon.  She denies any other symptoms including chest pain or shortness of breath, decreased urinary output, abdominal pain, polyuria or dysuria, confusion, lethargy, or hallucinations.  I have personally reviewed this patient's medical records.  She has history of recent diagnosis of atrial fibrillation atrial fibrillation and CHF with LVEF of 20%, now on Pacerone and Eliquis.  Patient also has history of hypertension.  She was recently admitted in early November to heart failure service secondary to A. fib with RVR CHF felt to be consistent with tachycardia mediated and uncontrolled hypertension.  Left heart catheterization on October 31, which revealed normal coronary arteries.  She underwent attempted cardioversion unsuccessfully.  Was started on spironolactone, losartan, Imdur, hydralazine, Farxiga and digoxin at her admission. No hx of thiazide diuretic usage, but has had recent fluid restriction in addition to new prescription of torsemide.   HPI     Past Medical History:  Diagnosis Date   Abnormal Pap smear of cervix    Allergy    Anemia    Fibroid    Hypertension    Leukocytosis, unspecified 10/18/2013   Obesity    Plantar fasciitis     Patient Active Problem List   Diagnosis Date Noted   Acute systolic (congestive) heart failure ([place]) 05/05/2021   Atrial fibrillation  with RVR ([place]) 05/04/2021   Acute congestive heart failure ([place])    Irregular heart beats 03/18/2020   Anemia 09/04/2017   BMI 50.0-59.9, adult ([place]) 09/03/2017   Reactive airway disease 09/03/2017   Vitamin D deficiency 09/03/2017   Leukocytosis 10/18/2013   Essential hypertension 10/24/2007   EXTERNAL HEMORRHOIDS 10/24/2007   ANAL FISSURE 10/24/2007    Past Surgical History:  Procedure Laterality Date   CARDIOVERSION N/A 05/12/2021   Procedure: CARDIOVERSION;  Surgeon: Jolaine Artist, MD;  Location: [facility];  Service: Cardiovascular;  Laterality: N/A;   COLPOSCOPY     RIGHT/LEFT HEART CATH AND CORONARY ANGIOGRAPHY N/A 05/11/2021   Procedure: RIGHT/LEFT HEART CATH AND CORONARY ANGIOGRAPHY;  Surgeon: Jolaine Artist, MD;  Location: [facility] CV LAB;  Service: Cardiovascular;  Laterality: N/A;   TEE WITHOUT CARDIOVERSION N/A 05/12/2021   Procedure: TRANSESOPHAGEAL ECHOCARDIOGRAM (TEE);  Surgeon: Jolaine Artist, MD;  Location: [facility] ENDOSCOPY;  Service: Cardiovascular;  Laterality: N/A;   TUBAL LIGATION  2001     OB History     Gravida  4   Para  4   Term  4   Preterm      AB      Living  4      SAB      IAB      Ectopic      Multiple      Live Births              Family History  Problem Relation Age of Onset   Hypertension Mother  Cervical cancer Mother    Hypertension Father    Stroke Father    Uterine cancer Maternal Grandmother    Diabetes Sister    Hypertension Brother    Hypertension Brother     Social History   Tobacco Use   Smoking status: Never   Smokeless tobacco: Never  Vaping Use   Vaping Use: Never used  Substance Use Topics   Alcohol use: No   Drug use: No    Home Medications Prior to Admission medications   Medication Sig Start Date End Date Taking? Authorizing Provider  acetaminophen (TYLENOL) 500 MG tablet Take 1,000 mg by mouth every 6 (six) hours as needed for moderate pain or headache.   Yes  [provider]  amiodarone (PACERONE) 200 MG tablet Take 1 tablet (200 mg total) by mouth 2 (two) times daily. 05/14/21  Yes Joette Catching, PA-C  apixaban (ELIQUIS) 5 MG TABS tablet Take 1 tablet (5 mg total) by mouth 2 (two) times daily. 05/14/21  Yes Joette Catching, PA-C  dapagliflozin propanediol (FARXIGA) 10 MG TABS tablet Take 1 tablet (10 mg total) by mouth daily. 05/15/21  Yes Joette Catching, PA-C  digoxin (LANOXIN) 0.125 MG tablet Take 1 tablet (0.125 mg total) by mouth daily. 05/15/21  Yes Joette Catching, PA-C  hydrALAZINE (APRESOLINE) 25 MG tablet Take 1.5 tablets (37.5 mg total) by mouth every 8 (eight) hours. 05/14/21  Yes Joette Catching, PA-C  isosorbide mononitrate (IMDUR) 60 MG 24 hr tablet Take 1 tablet (60 mg total) by mouth daily. 05/15/21  Yes Joette Catching, PA-C  losartan (COZAAR) 100 MG tablet Take 1 tablet (100 mg total) by mouth daily. 05/15/21  Yes Joette Catching, PA-C  potassium chloride SA (KLOR-CON) 20 MEQ tablet Take 2 tablets (40 mEq total) by mouth daily. 05/15/21  Yes Joette Catching, PA-C  Probiotic CHEW Chew 2 capsules by mouth daily.   Yes [provider]  spironolactone (ALDACTONE) 25 MG tablet Take 1 tablet (25 mg total) by mouth daily. 05/15/21  Yes Joette Catching, PA-C  torsemide (DEMADEX) 20 MG tablet Take 1 tablet (20 mg total) by mouth daily. 05/16/21  Yes Joette Catching, PA-C    Allergies    Lisinopril-hydrochlorothiazide and Other  Review of Systems   Review of Systems  Constitutional: Negative.   HENT: Negative.    Respiratory: Negative.    Cardiovascular:  Positive for palpitations. Negative for chest pain and leg swelling.  Gastrointestinal:  Positive for constipation. Negative for abdominal distention, abdominal pain, anal bleeding, blood in stool, diarrhea, nausea, rectal pain and vomiting.       Denies melena  Genitourinary: Negative.   Musculoskeletal: Negative.    Skin: Negative.   Neurological: Negative.   Hematological: Negative.   Psychiatric/Behavioral: Negative.     Physical Exam Updated Vital Signs BP 124/69   Pulse 75   Temp 98.3 F (36.8 C) (Oral)   Resp (!) 22   Ht 5\' 3"  (1.6 m)   Wt 127.9 kg   LMP 05/01/2021 (Exact Date)   SpO2 99%   BMI 49.95 kg/m   Physical Exam Vitals and nursing note reviewed.  Constitutional:      General: She is sleeping.     Appearance: She is obese. She is not ill-appearing or toxic-appearing.  HENT:     Head: Normocephalic and atraumatic.     Nose: Nose normal. No congestion.     Mouth/Throat:     Mouth: Mucous membranes  are moist.     Pharynx: Oropharynx is clear. Uvula midline. No oropharyngeal exudate or posterior oropharyngeal erythema.     Tonsils: No tonsillar exudate.  Eyes:     General: Lids are normal. Vision grossly intact.        Right eye: No discharge.        Left eye: No discharge.     Extraocular Movements: Extraocular movements intact.     Conjunctiva/sclera: Conjunctivae normal.     Pupils: Pupils are equal, round, and reactive to light.  Neck:     Trachea: Trachea and phonation normal.  Cardiovascular:     Rate and Rhythm: Normal rate and regular rhythm.     Pulses: Normal pulses.     Heart sounds: Normal heart sounds. No murmur heard. Pulmonary:     Effort: Pulmonary effort is normal. No respiratory distress.     Breath sounds: Normal breath sounds. No wheezing or rales.  Abdominal:     General: Bowel sounds are normal. There is no distension.     Palpations: Abdomen is soft.     Tenderness: There is no abdominal tenderness. There is no right CVA tenderness, left CVA tenderness or guarding.  Musculoskeletal:        General: No deformity.     Cervical back: Normal range of motion and neck supple. No edema, rigidity, tenderness or crepitus. No pain with movement, spinous process tenderness or muscular tenderness.     Right lower leg: No edema.     Left lower leg: No  edema.  Lymphadenopathy:     Cervical: No cervical adenopathy.  Skin:    General: Skin is warm and dry.     Capillary Refill: Capillary refill takes less than 2 seconds.  Neurological:     General: No focal deficit present.     Mental Status: She is oriented to person, place, and time and easily aroused. Mental status is at baseline.     GCS: GCS eye subscore is 4. GCS verbal subscore is 5. GCS motor subscore is 6.     Sensory: Sensation is intact.     Motor: Motor function is intact.     Gait: Gait is intact.  Psychiatric:        Attention and Perception: Attention normal.        Mood and Affect: Mood normal.     Comments: Does not appear to be responding to internal stimuli.    ED Results / Procedures / Treatments   Labs (all labs ordered are listed, but only abnormal results are displayed) Labs Reviewed  CBC WITH DIFFERENTIAL/PLATELET - Abnormal; Notable for the following components:      Result Value   Platelets 460 (*)    All other components within normal limits  COMPREHENSIVE METABOLIC PANEL - Abnormal; Notable for the following components:   Glucose, Bld 101 (*)    BUN 26 (*)    Creatinine, Ser 1.63 (*)    Calcium 13.5 (*)    Total Protein 8.2 (*)    GFR, Estimated 38 (*)    All other components within normal limits  RESP PANEL BY RT-PCR (FLU A&B, COVID) ARPGX2  PHOSPHORUS  MAGNESIUM  PARATHYROID HORMONE, INTACT (NO CA)  TSH  PTH-RELATED PEPTIDE    EKG EKG: A flutter, no STEMI.  Radiology No results found.  Procedures Procedures   Medications Ordered in ED Medications  lactated ringers infusion (has no administration in time range)  zoledronic acid (ZOMETA) 4 mg in sodium chloride  0.9 % 100 mL IVPB (has no administration in time range)  hydrALAZINE (APRESOLINE) tablet 37.5 mg (has no administration in time range)  apixaban (ELIQUIS) tablet 5 mg (has no administration in time range)  amiodarone (PACERONE) tablet 200 mg (has no administration in time  range)  isosorbide mononitrate (IMDUR) 24 hr tablet 60 mg (has no administration in time range)    ED Course  I have reviewed the triage vital signs and the nursing notes.  Pertinent labs & imaging results that were available during my care of the patient were reviewed by me and considered in my medical decision making (see chart for details).  Clinical Course as of 05/26/21 0458  Tue May 26, 2021  0129 Consult to cardiologist, Dr. Conley Canal, who is agreeable to seeing this patient and providing recommendations after his evaluation and chart review.  I appreciate his collaboration of care of this patient. [RS]  [phone] Cardiologist, Dr. Conley Canal, cardiologist states that patient will require medical admission for management of her hypercalcemia.  He recommends absolute maximum fluid administration of 2 L within a 24-hour period right now.  Recommends diuretics are held. I appreciate his collaboration in the care of this patient. [RS]  7124 EKG 12-Lead [RS]  5809 Consult to hospitalist, Dr. Alcario Drought, who is agreeable to seeing this patient and admitting her to his service. I appreciate his collaboration in the care of this patient.  [RS]    Clinical Course User Index [RS] Zaven Klemens, Sharlene Dory   MDM Rules/Calculators/A&P                         50 year old female who presents with concern for elevated calcium on outpatient laboratory studies.  Differential diagnosis includes for hypercalcemia includes but is not limited to thyroid or parathyroid anomaly, malignancy, milk-alkali syndrome, thiazide diuretics, lithium medications, sarcoidosis.  Hypertensive intake, mild aortic 103.   Cardiopulmonary exam is normal, abdominal exam is benign.  Patient is neurovascular intact in all 4 extremities.  ANO x4, no focal deficit on neuro exam.  CBC with mild thrombocytosis to 460.  CMP with hypercalcemia 13.5.  No other electrolyte arrangements.  Creatinine 1.6, increased from apparent baseline of 1,  however has been trending upward since 2 weeks ago.  Will add PTH, TSH, mag, phosphorus.   Extensive review the patient's medications following recent admission where she was newly diagnosed with A. fib as well as CHF likely secondary to her dysrhythmia and her hypertension.  Patient is not on thiazide diuretics but does have a fluid restriction.  Consultation with cardiologist as above.  No clear etiology for hypercalcemia at this time but per cardiology recommendation we will admit to the hospitalist for management of her hypercalcemia and further work-up.  We will hold off on bolus at this time pending discussion with hospitalist.  Disposition plan discussed with the patient by attending physician, Dr. Sedonia Small.  Ikeya voiced understanding of her medical evaluation and treatment plan.  Each of her questions was answered to her expressed satisfaction.  She is amenable to plan for admission at this time.  This chart was dictated using voice recognition software, Dragon. Despite the best efforts of this provider to proofread and correct errors, errors may still occur which can change documentation meaning.  Final Clinical Impression(s) / ED Diagnoses Final diagnoses:  Hypercalcemia    Rx / DC Orders ED Discharge Orders     None        Lerin Jech, Eugene Garnet  R, PA-C 05/26/21 0458    Maudie Flakes, MD 05/26/21 [phone]

## 2021-05-26 NOTE — Progress Notes (Signed)
The patient is a 50 yr old woman who was recently discharged from this facility after a stay for atrial fibrillation with RVR. The patient had a lab draw on her post discharge visit to her PCP which demonstrated hypercalcemia. The patient was directed to come to the ED. Prior to the visit with her PCP the patient had experienced constipation and some palpitations, but no other symptoms. As her TEE demonstrated EF of 20% she had been discharged on diuretics and other new medications.   In the ED the patient was found to be in atrial fibrillation with rate of 108. Calcium was found to be 13.5. Her creatinine had increased to 1.6 from her baseline on 0.8.   [facility] hospitalists were consulted to admit the patient for further evaluation and treatment.   The patient was admitted this morning by my colleague, Dr. Alcario Drought.  The patient is being roomed in the ED pending availability of a bed upstairs. She is receiving cautious IV fluids. Cardiology has been consulted. PTH and PTHrp are pending. CXR has demonstrated no active disease. CT chest is planned.  Vitals signs are within normal limits. Calcium and other electrolytes are being followed.  The patient is awake, alert, and oriented x 3. No acute distress. Heart and lung sounds are within normal limits. Abdomen is soft, non-tender, non-distended. Positive for mild edema of lower extremities bilaterally.

## 2021-05-27 ENCOUNTER — Other Ambulatory Visit (HOSPITAL_COMMUNITY): Payer: Self-pay

## 2021-05-27 ENCOUNTER — Encounter (HOSPITAL_COMMUNITY): Payer: Self-pay | Admitting: Internal Medicine

## 2021-05-27 DIAGNOSIS — I4892 Unspecified atrial flutter: Secondary | ICD-10-CM

## 2021-05-27 DIAGNOSIS — I4891 Unspecified atrial fibrillation: Secondary | ICD-10-CM

## 2021-05-27 LAB — COMPREHENSIVE METABOLIC PANEL
ALT: 24 U/L (ref 0–44)
AST: 23 U/L (ref 15–41)
Albumin: 3 g/dL — ABNORMAL LOW (ref 3.5–5.0)
Alkaline Phosphatase: 83 U/L (ref 38–126)
Anion gap: 6 (ref 5–15)
BUN: 22 mg/dL — ABNORMAL HIGH (ref 6–20)
CO2: 23 mmol/L (ref 22–32)
Calcium: 11.7 mg/dL — ABNORMAL HIGH (ref 8.9–10.3)
Chloride: 102 mmol/L (ref 98–111)
Creatinine, Ser: 1.14 mg/dL — ABNORMAL HIGH (ref 0.44–1.00)
GFR, Estimated: 58 mL/min — ABNORMAL LOW (ref >60)
Glucose, Bld: 121 mg/dL — ABNORMAL HIGH (ref 70–99)
Potassium: 3.9 mmol/L (ref 3.5–5.1)
Sodium: 131 mmol/L — ABNORMAL LOW (ref 135–145)
Total Bilirubin: 0.4 mg/dL (ref 0.3–1.2)
Total Protein: 6.6 g/dL (ref 6.5–8.1)

## 2021-05-27 LAB — PARATHYROID HORMONE, INTACT (NO CA): PTH: 91 pg/mL — ABNORMAL HIGH (ref 15–65)

## 2021-05-27 LAB — CALCIUM: Calcium: 11.8 mg/dL — ABNORMAL HIGH (ref 8.9–10.3)

## 2021-05-27 LAB — CALCIUM, IONIZED: Calcium, Ionized, Serum: 7.1 mg/dL — ABNORMAL HIGH (ref 4.5–5.6)

## 2021-05-27 MED ORDER — POLYETHYLENE GLYCOL 3350 17 G PO PACK
17.0000 g | PACK | Freq: Two times a day (BID) | ORAL | Status: DC
  Administered 2021-05-27 – 2021-05-30 (×7): 17 g via ORAL
  Filled 2021-05-27 (×7): qty 1

## 2021-05-27 MED ORDER — SENNOSIDES-DOCUSATE SODIUM 8.6-50 MG PO TABS
1.0000 | ORAL_TABLET | Freq: Two times a day (BID) | ORAL | Status: DC
  Administered 2021-05-27 – 2021-05-30 (×7): 1 via ORAL
  Filled 2021-05-27 (×7): qty 1

## 2021-05-27 NOTE — Progress Notes (Addendum)
Advanced Heart Failure Rounding Note  PCP-Cardiologist: None   Subjective:    Feels well. Up ambulating halls without any dyspnea.   Scr improving, 1.6 > 1.3 > 1.1. Continuing to hold Torsemide and digoxin.   Received IV fluids and IV pamidronate, calcium coming down.   Still getting fluids at 100 ml/hr  BP elevated  Hx AF but rhythm this admit looks like ? AFL, rate better controlled with amio gtt   Objective:   Weight Range: 129.9 kg Body mass index is 50.73 kg/m.   Vital Signs:   Temp:  [98.2 F (36.8 C)-98.4 F (36.9 C)] 98.4 F (36.9 C) (11/16 0809) Pulse Rate:  [71-115] 72 (11/16 0809) Resp:  [14-27] 19 (11/16 0809) BP: (109-159)/(55-85) 156/85 (11/16 0809) SpO2:  [94 %-100 %] 100 % (11/16 0809) Weight:  [129.9 kg] 129.9 kg (11/16 0042) Last BM Date: 05/26/21  Weight change: Filed Weights   05/25/21 2346 05/27/21 0042  Weight: 127.9 kg 129.9 kg    Intake/Output:   Intake/Output Summary (Last 24 hours) at 05/27/2021 0947 Last data filed at 05/27/2021 0644 Gross per 24 hour  Intake 989.12 ml  Output 350 ml  Net 639.12 ml      Physical Exam    General:  No distress. Sitting up in bed. HEENT: Normal Neck: Supple. No JVD. Carotids 2+ bilat; no bruits. No lymphadenopathy or thyromegaly appreciated. Cor: PMI nondisplaced. Irregular rhythm, rate 70s-80s. No rubs, gallops or murmurs. Lungs: Clear Abdomen: Soft, nontender, nondistended. No hepatosplenomegaly. No bruits or masses. Good bowel sounds. Extremities: No cyanosis, clubbing, rash, edema Neuro: Alert & orientedx3, cranial nerves grossly intact. moves all 4 extremities w/o difficulty. Affect pleasant   Telemetry   AFL 70s-80s   Labs    CBC Recent Labs    05/25/21 2100  WBC 8.4  NEUTROABS 5.5  HGB 12.3  HCT 37.8  MCV 94.5  PLT 962*   Basic Metabolic Panel Recent Labs    05/26/21 0457 05/26/21 1055 05/26/21 1212 05/27/21 0210  NA  --  134*  --  131*  K  --  4.1  --   3.9  CL  --  104  --  102  CO2  --  23  --  23  GLUCOSE  --  144*  --  121*  BUN  --  20  --  22*  CREATININE  --  1.28*  --  1.14*  CALCIUM  --  12.8* 12.4* 11.7*  11.8*  MG 2.7*  --   --   --   PHOS 2.1*  --   --   --    Liver Function Tests Recent Labs    05/25/21 2100 05/27/21 0210  AST 21 23  ALT 25 24  ALKPHOS 89 83  BILITOT 0.7 0.4  PROT 8.2* 6.6  ALBUMIN 3.9 3.0*   No results for input(s): LIPASE, AMYLASE in the last 72 hours. Cardiac Enzymes No results for input(s): CKTOTAL, CKMB, CKMBINDEX, TROPONINI in the last 72 hours.  BNP: BNP (last 3 results) Recent Labs    05/03/21 2349  BNP 670.0*    ProBNP (last 3 results) No results for input(s): PROBNP in the last 8760 hours.   D-Dimer No results for input(s): DDIMER in the last 72 hours. Hemoglobin A1C No results for input(s): HGBA1C in the last 72 hours. Fasting Lipid Panel Recent Labs    05/25/21 1414  CHOL 193  HDL 47.60  LDLCALC 125*  TRIG 102.0  CHOLHDL 4  Thyroid Function Tests Recent Labs    05/26/21 0457  TSH 3.322    Other results:   Imaging    No results found.   Medications:     Scheduled Medications:  apixaban  5 mg Oral BID   hydrALAZINE  37.5 mg Oral Q8H   isosorbide mononitrate  60 mg Oral Daily    Infusions:  amiodarone 30 mg/hr (05/27/21 0403)   lactated ringers 100 mL/hr at 05/26/21 2354    PRN Medications: acetaminophen **OR** acetaminophen, ondansetron **OR** ondansetron (ZOFRAN) IV    Patient Profile   51 y.o. 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 Recently discharged from hospital 11/03 and readmitted on 11/14 with AKI and hypercalcemia  Assessment/Plan   1. Hypercalcemia: Ultimate cause uncertain.  No thiazide diuretic, no Ca supplement or vitamin D, no known malignancy and clear CXR.  Suspect there is an underlying predisposition to hypercalcemia with precipitation of marked  hypercalcemia by aggressive diuresis/volume depletion/AKI.   - She has received pamidronate IV, Ca improved from 13.9 > 11.7 - IV lactated ringers 100 cc/hr since yesterday am. Need to be cautious to avoid volume overload. ? Duration fluids with improvement in Ca. - Need workup for hyperparathyroidism => PT pending   - Less likely malignancy 1, 25 hydroxyvitamin D level low, PTHrp, myeloma panel, and urine immunofixation pending.   2. AKI: Creatinine 0.9 at baseline => 1.63 in setting of diuresis with acute systolic CHF.   - Holding Torsemide, spiro, losartan and digoxin - Scr improving, down to 1.1 this am  3. Chronic systolic CHF: Nonischemic CMP, ?tachy-mediated.  Last study was TEE in 11/22 showing E 25-30%, normal RV, mild MR.  She is not significantly volume overloaded on exam, has AKI.  - Holding torsemide as above.  - With AKI, holding losartan, spironolactone, [place]. Add back slowly as renal function returns to baseline - Increased risk digoxin toxicity with hyperCa, holding digoxin.  - Continue hydralazine/Imdur for now.   4. Atrial fibrillation with RVR/now AFL with RVR: Failed DCCV last admission.  Has been on amiodarone.  - Rate controlled with amio gtt at 30/hr - Continue Eliquis.  Has not missed any doses of Eliquis at home - Now that she has loaded more amiodarone, consider repeat DCCV this admission.     Length of Stay: 1  FINCH, LINDSAY N, PA-C  05/27/2021, 9:47 AM  Advanced Heart Failure Team Pager [phone] (M-F; 7a - 5p)  Please contact [facility] for night-coverage after hours (5p -7a ) and weekends on [email or web address]  Patient seen and examined with the above-signed Advanced Practice Provider and/or Housestaff. I personally reviewed laboratory data, imaging studies and relevant notes. I independently examined the patient and formulated the important aspects of the plan. I have edited the note to reflect any of my changes or salient points. I have personally  discussed the plan with the patient and/or family.  Has been receiving IVF and pamidronate for hypercalcemia. Denies SOB, orthopnea or PND. Scr improving. Remains in AF/AFL with RVR on IV amio. On Eliquis. No bleeding  General:  Sitting up in bed No resp difficulty HEENT: normal Neck: supple. Unable to JVP Carotids 2+ bilat; no bruits. No lymphadenopathy or thryomegaly appreciated. Cor: Irr tachy No rubs, gallops or murmurs. Lungs: clear Abdomen: obese soft, nontender, nondistended. No hepatosplenomegaly. No bruits or masses. Good bowel sounds. Extremities: no cyanosis, clubbing, rash, tr edema Neuro: alert & orientedx3, cranial nerves grossly intact. moves all 4 extremities w/o difficulty.  Affect pleasant  Serum calcium improving. Management per TRH. Volume status hard to assess but looks ok for now. Will likely need to stop fluids soon. Remains in AFL with RVR. Failed DC-CV last admit. Once she is stabilized metabolically will plan repeat DC-CV.   Glori Bickers, MD  4:40 PM

## 2021-05-27 NOTE — Progress Notes (Signed)
Progress Note    Christine Oneal  ASN:[id number] DOB: [birth year]  DOA: 05/25/2021 PCP: Maximiano Coss, NP    Brief Narrative:     Medical records reviewed and are as summarized below:  Christine Oneal is an 50 y.o. female  with medical history significant of HTN, recently diagnosed HFrEF with EF 20%, also A.Fib.  Pt just hospitalized earlier this month.  Started on numerous meds and diuretics, discharged. Pt went to PCP for follow up.  Having some constipation and palpitations (a.Fib), but no other new symptoms. At PCP labs showed hypercalcemia and AKI.  Pt sent in to ED.  Assessment/Plan:   Principal Problem:   Hypercalcemia Active Problems:   Essential hypertension   Unspecified atrial fibrillation (HCC)   Chronic systolic CHF (congestive heart failure) (HCC)   AKI (acute kidney injury) ([place])   Hypercalcemia - -EDP spoke with cards, they recd holding diuretics, doing IVF at no more than 2L in 24h -Pt not on thiazide diuretic at baseline to explain her hypercalcemia. -Regarding cause of hypercalcemia: Wilder Glade has had case reports of causing this EDP informs me that aldactone can very rarely cause hypercalcemia (dont see this in its known side effects though). PTH pending PTHrp pending -Pamidronate being given  AKI - -Hold losartan -Hold diuretics -Gentle hydration -Strict intake and output -improving   A.Fib - -Cont amiodarone -Hold digoxin (hypercalcemia increases risk of digoxin toxicity). -Cont eliquis  Chronic systolic CHF - -Holding diuretics -Holding ARB -Cont imdur -Cont hydralazine -cards consult  Obesity morbid Body mass index is 50.73 kg/m.   Family Communication/Anticipated D/C date and plan/Code Status   DVT prophylaxis: Lovenox ordered. Code Status: Full Code.  Disposition Plan: Status is: Inpatient  Remains inpatient appropriate because: calcium elevated          Medical Consultants:   cards    Subjective:   No  complaints- walked well  Objective:    Vitals:   05/27/21 0042 05/27/21 0336 05/27/21 0809 05/27/21 1123  BP: (!) 159/70 126/76 (!) 156/85 (!) 145/72  Pulse: 76 71 72 80  Resp: 17 16 19 19   Temp: 98.4 F (36.9 C) 98.2 F (36.8 C) 98.4 F (36.9 C) 98.3 F (36.8 C)  TempSrc: Oral Oral Oral Oral  SpO2: 98% 98% 100% 99%  Weight: 129.9 kg     Height: 5\' 3"  (1.6 m)       Intake/Output Summary (Last 24 hours) at 05/27/2021 1218 Last data filed at 05/27/2021 1013 Gross per 24 hour  Intake 834 ml  Output 350 ml  Net 484 ml   Filed Weights   05/25/21 2346 05/27/21 0042  Weight: 127.9 kg 129.9 kg    Exam:  General: Appearance:    Severely obese female in no acute distress     Lungs:     respirations unlabored  Heart:    Normal heart rate.    MS:   All extremities are intact.    Neurologic:   Awake, alert, oriented x 3. No apparent focal neurological           defect.      Data Reviewed:   I have personally reviewed following labs and imaging studies:  Labs: Labs show the following:   Basic Metabolic Panel: Recent Labs  Lab 05/25/21 1414 05/25/21 2100 05/26/21 0457 05/26/21 1055 05/26/21 1212 05/27/21 0210  NA 140 140  --  134*  --  131*  K 4.9 4.2  --  4.1  --  3.9  CL 104 106  --  104  --  102  CO2 27 25  --  23  --  23  GLUCOSE 96 101*  --  144*  --  121*  BUN 29* 26*  --  20  --  22*  CREATININE 1.65* 1.63*  --  1.28*  --  1.14*  CALCIUM 13.9* 13.5*  --  12.8* 12.4* 11.7*  11.8*  MG  --   --  2.7*  --   --   --   PHOS  --   --  2.1*  --   --   --    GFR Estimated Creatinine Clearance: 76.9 mL/min (A) (by C-G formula based on SCr of 1.14 mg/dL (H)). Liver Function Tests: Recent Labs  Lab 05/25/21 1414 05/25/21 2100 05/27/21 0210  AST 19 21 23   ALT 22 25 24   ALKPHOS 105 89 83  BILITOT 1.0 0.7 0.4  PROT 8.1 8.2* 6.6  ALBUMIN 4.3 3.9 3.0*   No results for input(s): LIPASE, AMYLASE in the last 168 hours. No results for input(s): AMMONIA in  the last 168 hours. Coagulation profile No results for input(s): INR, PROTIME in the last 168 hours.  CBC: Recent Labs  Lab 05/25/21 2100  WBC 8.4  NEUTROABS 5.5  HGB 12.3  HCT 37.8  MCV 94.5  PLT 460*   Cardiac Enzymes: No results for input(s): CKTOTAL, CKMB, CKMBINDEX, TROPONINI in the last 168 hours. BNP (last 3 results) No results for input(s): PROBNP in the last 8760 hours. CBG: No results for input(s): GLUCAP in the last 168 hours. D-Dimer: No results for input(s): DDIMER in the last 72 hours. Hgb A1c: No results for input(s): HGBA1C in the last 72 hours. Lipid Profile: Recent Labs    05/25/21 1414  CHOL 193  HDL 47.60  LDLCALC 125*  TRIG 102.0  CHOLHDL 4   Thyroid function studies: Recent Labs    05/26/21 0457  TSH 3.322   Anemia work up: No results for input(s): VITAMINB12, FOLATE, FERRITIN, TIBC, IRON, RETICCTPCT in the last 72 hours. Sepsis Labs: Recent Labs  Lab 05/25/21 2100  WBC 8.4    Microbiology Recent Results (from the past 240 hour(s))  Resp Panel by RT-PCR (Flu A&B, Covid) Nasopharyngeal Swab     Status: None   Collection Time: 05/26/21  3:24 AM   Specimen: Nasopharyngeal Swab; Nasopharyngeal(NP) swabs in vial transport medium  Result Value Ref Range Status   SARS Coronavirus 2 by RT PCR NEGATIVE NEGATIVE Final    Comment: (NOTE) SARS-CoV-2 target nucleic acids are NOT DETECTED.  The SARS-CoV-2 RNA is generally detectable in upper respiratory specimens during the acute phase of infection. The lowest concentration of SARS-CoV-2 viral copies this assay can detect is 138 copies/mL. A negative result does not preclude SARS-Cov-2 infection and should not be used as the sole basis for treatment or other patient management decisions. A negative result may occur with  improper specimen collection/handling, submission of specimen other than nasopharyngeal swab, presence of viral mutation(s) within the areas targeted by this assay, and  inadequate number of viral copies(<138 copies/mL). A negative result must be combined with clinical observations, patient history, and epidemiological information. The expected result is Negative.  Fact Sheet for Patients:  EntrepreneurPulse.com.au  Fact Sheet for Healthcare Providers:  IncredibleEmployment.be  This test is no t yet approved or cleared by the [place] FDA and  has been authorized for detection and/or diagnosis of SARS-CoV-2 by FDA under an Emergency Use Authorization (  EUA). This EUA will remain  in effect (meaning this test can be used) for the duration of the COVID-19 declaration under Section 564(b)(1) of the Act, 21 U.S.C.section 360bbb-3(b)(1), unless the authorization is terminated  or revoked sooner.       Influenza A by PCR NEGATIVE NEGATIVE Final   Influenza B by PCR NEGATIVE NEGATIVE Final    Comment: (NOTE) The Xpert Xpress SARS-CoV-2/FLU/RSV plus assay is intended as an aid in the diagnosis of influenza from Nasopharyngeal swab specimens and should not be used as a sole basis for treatment. Nasal washings and aspirates are unacceptable for Xpert Xpress SARS-CoV-2/FLU/RSV testing.  Fact Sheet for Patients: EntrepreneurPulse.com.au  Fact Sheet for Healthcare Providers: IncredibleEmployment.be  This test is not yet approved or cleared by the [place] FDA and has been authorized for detection and/or diagnosis of SARS-CoV-2 by FDA under an Emergency Use Authorization (EUA). This EUA will remain in effect (meaning this test can be used) for the duration of the COVID-19 declaration under Section 564(b)(1) of the Act, 21 U.S.C. section 360bbb-3(b)(1), unless the authorization is terminated or revoked.  Performed at [facility], [place] [address] [place], [place] [address]     Procedures and diagnostic studies:  DG CHEST PORT 1 VIEW  Result Date:  05/26/2021 CLINICAL DATA:  Hypercalcemia. EXAM: PORTABLE CHEST 1 VIEW COMPARISON:  05/05/2021 FINDINGS: 0504 hours. The lungs are clear without focal pneumonia, edema, pneumothorax or pleural effusion. Cardiopericardial silhouette is at upper limits of normal for size. The visualized bony structures of the thorax show no acute abnormality. Telemetry leads overlie the chest. IMPRESSION: No active disease. Electronically Signed   By: Misty Stanley M.D.   On: 05/26/2021 05:46    Medications:    apixaban  5 mg Oral BID   hydrALAZINE  37.5 mg Oral Q8H   isosorbide mononitrate  60 mg Oral Daily   Continuous Infusions:  amiodarone 30 mg/hr (05/27/21 0403)   lactated ringers 100 mL/hr at 05/26/21 2354     LOS: 1 day   Geradine Girt  [facility]   How to contact the [facility] Attending or Consulting provider [place] or covering provider during after hours [place], for this patient?  Check the care team in [facility] and look for a) attending/consulting TRH provider listed and b) the [facility] team listed Log into [email or web address] and use [place]'s universal password to access. If you do not have the password, please contact the hospital operator. Locate the [facility] provider you are looking for under [facility] and page to a number that you can be directly reached. If you still have difficulty reaching the provider, please page the [facility] (Director on Call) for the Hospitalists listed on amion for assistance.  05/27/2021, 12:18 PM

## 2021-05-27 NOTE — Progress Notes (Signed)
Mobility Specialist Progress Note:   05/27/21 1013  Mobility  Activity Ambulated in hall  Level of Assistance Standby assist, set-up cues, supervision of patient - no hands on  Assistive Device None  Distance Ambulated (ft) 470 ft  Mobility Ambulated with assistance in hallway  Mobility Response Tolerated well  Mobility performed by Mobility specialist  $Mobility charge 1 Mobility   Pt received in bed willing to participate in mobility. No complaints of pain and asymptomatic. Pt returned to bed with call bell in reach and all needs met.   [facility] Public librarian Phone [phone] Secondary Phone [phone]

## 2021-05-27 NOTE — TOC Initial Note (Signed)
Transition of Care [facility]) - Initial/Assessment Note    Patient Details  Name: Christine Oneal MRN: [id number] Date of Birth: 1971/04/04  Transition of [facility]) CM/SW Contact:    Erenest Rasher, RN Phone Number: [phone] 05/27/2021, 3:35 PM  Clinical Narrative:                 HF TOC CM spoke to pt and she lives at home with dtr. States she is active with [facility] and PT. She is requesting an aide. Contacted [facility] rep, Kenzie for resumption of care at [employer]. Will request HH order from attending. Will need HH RN, PT and aide orders with [facility]. Has RW at home.   Expected Discharge Plan: [place] Barriers to Discharge: Continued Medical Work up   Patient Goals and CMS Choice Patient states their goals for this hospitalization and ongoing recovery are:: wants to remain independent CMS Medicare.gov Compare Post Acute Care list provided to:: Patient Choice offered to / list presented to : Patient  Expected Discharge Plan and Services Expected Discharge Plan: [place] In-house Referral: Clinical Social Work Discharge Planning Services: CM Consult Post Acute Care Choice: [place] arrangements for the past 2 months: [place]: [place] ([place]) Date [place]: 05/27/21 Time [place]: 1532 Representative spoke with at [place]: Elijio Miles  Prior Living Arrangements/Services Living arrangements for the past 2 months: [place] with:: Adult Children Patient language and need for interpreter reviewed:: Yes Do you feel safe going back to the place where you live?: Yes      Need for Family Participation in Patient Care: No (Comment) Care giver support system in place?: No (comment) Current home services: DME, Home PT, Home RN (Rolling walker, shower chair) Criminal Activity/Legal Involvement Pertinent to Current  Situation/Hospitalization: No - Comment as needed  Activities of Daily Living Home Assistive Devices/Equipment: None ADL Screening (condition at time of admission) Patient's cognitive ability adequate to safely complete daily activities?: Yes Is the patient deaf or have difficulty hearing?: No Does the patient have difficulty seeing, even when wearing glasses/contacts?: No Does the patient have difficulty concentrating, remembering, or making decisions?: No Patient able to express need for assistance with ADLs?: Yes Does the patient have difficulty dressing or bathing?: No Independently performs ADLs?: Yes (appropriate for developmental age) Does the patient have difficulty walking or climbing stairs?: Yes Weakness of Legs: Both Weakness of Arms/Hands: None  Permission Sought/Granted Permission sought to share information with : Case Manager, PCP, Family Supports Permission granted to share information with : Yes, Verbal Permission Granted  Share Information with NAME: Leonor Liv  Permission granted to share info w AGENCY: [place] granted to share info w Relationship: daughter  Permission granted to share info w Contact Information: [phone]  Emotional Assessment Appearance:: Appears stated age Attitude/Demeanor/Rapport: Engaged Affect (typically observed): Accepting Orientation: : Oriented to Self, Oriented to Place, Oriented to  Time, Oriented to Situation   Psych Involvement: No (comment)  Admission diagnosis:  Hypercalcemia [E83.52] Patient Active Problem List   Diagnosis Date Noted   Hypercalcemia 48/07/6551   Chronic systolic CHF (congestive heart failure) ([place]) 05/26/2021   AKI (acute kidney injury) ([place]) 74/82/7078   Acute systolic (congestive) heart failure ([place]) 05/05/2021  Unspecified atrial fibrillation ([place]) 05/04/2021   Acute congestive heart failure ([place])    Irregular heart beats 03/18/2020   Anemia 09/04/2017   BMI 50.0-59.9, adult  ([place]) 09/03/2017   Reactive airway disease 09/03/2017   Vitamin D deficiency 09/03/2017   Leukocytosis 10/18/2013   Essential hypertension 10/24/2007   EXTERNAL HEMORRHOIDS 10/24/2007   ANAL FISSURE 10/24/2007   PCP:  Maximiano Coss, NP Pharmacy:   [facility] [address] [place] [address] Phone: [phone] Fax: [phone]     Social Determinants of Health (SDOH) Interventions    Readmission Risk Interventions No flowsheet data found.

## 2021-05-28 ENCOUNTER — Inpatient Hospital Stay (HOSPITAL_COMMUNITY): Payer: 59

## 2021-05-28 ENCOUNTER — Encounter (HOSPITAL_COMMUNITY): Payer: Medicaid Other

## 2021-05-28 LAB — MULTIPLE MYELOMA PANEL, SERUM
Albumin SerPl Elph-Mcnc: 3.1 g/dL (ref 2.9–4.4)
Albumin/Glob SerPl: 1 (ref 0.7–1.7)
Alpha 1: 0.2 g/dL (ref 0.0–0.4)
Alpha2 Glob SerPl Elph-Mcnc: 0.7 g/dL (ref 0.4–1.0)
B-Globulin SerPl Elph-Mcnc: 1.1 g/dL (ref 0.7–1.3)
Gamma Glob SerPl Elph-Mcnc: 1.4 g/dL (ref 0.4–1.8)
Globulin, Total: 3.4 g/dL (ref 2.2–3.9)
IgA: 380 mg/dL — ABNORMAL HIGH (ref 87–352)
IgG (Immunoglobin G), Serum: 1571 mg/dL (ref 586–1602)
IgM (Immunoglobulin M), Srm: 59 mg/dL (ref 26–217)
Total Protein ELP: 6.5 g/dL (ref 6.0–8.5)

## 2021-05-28 LAB — CBC
HCT: 34.2 % — ABNORMAL LOW (ref 36.0–46.0)
Hemoglobin: 10.9 g/dL — ABNORMAL LOW (ref 12.0–15.0)
MCH: 30.1 pg (ref 26.0–34.0)
MCHC: 31.9 g/dL (ref 30.0–36.0)
MCV: 94.5 fL (ref 80.0–100.0)
Platelets: 414 10*3/uL — ABNORMAL HIGH (ref 150–400)
RBC: 3.62 MIL/uL — ABNORMAL LOW (ref 3.87–5.11)
RDW: 14.6 % (ref 11.5–15.5)
WBC: 6.8 10*3/uL (ref 4.0–10.5)
nRBC: 0 % (ref 0.0–0.2)

## 2021-05-28 LAB — BASIC METABOLIC PANEL
Anion gap: 7 (ref 5–15)
Anion gap: 9 (ref 5–15)
BUN: 14 mg/dL (ref 6–20)
BUN: 11 mg/dL (ref 6–20)
CO2: 22 mmol/L (ref 22–32)
CO2: 22 mmol/L (ref 22–32)
Calcium: 11.3 mg/dL — ABNORMAL HIGH (ref 8.9–10.3)
Calcium: 11.2 mg/dL — ABNORMAL HIGH (ref 8.9–10.3)
Chloride: 103 mmol/L (ref 98–111)
Chloride: 101 mmol/L (ref 98–111)
Creatinine, Ser: 1.14 mg/dL — ABNORMAL HIGH (ref 0.44–1.00)
Creatinine, Ser: 1.01 mg/dL — ABNORMAL HIGH (ref 0.44–1.00)
GFR, Estimated: 58 mL/min — ABNORMAL LOW (ref >60)
GFR, Estimated: >60 mL/min (ref >60)
Glucose, Bld: 98 mg/dL (ref 70–99)
Glucose, Bld: 127 mg/dL — ABNORMAL HIGH (ref 70–99)
Potassium: 4 mmol/L (ref 3.5–5.1)
Potassium: 3.2 mmol/L — ABNORMAL LOW (ref 3.5–5.1)
Sodium: 132 mmol/L — ABNORMAL LOW (ref 135–145)
Sodium: 132 mmol/L — ABNORMAL LOW (ref 135–145)

## 2021-05-28 LAB — CALCITRIOL (1,25 DI-OH VIT D): Vit D, 1,25-Dihydroxy: 54.5 pg/mL (ref 24.8–81.5)

## 2021-05-28 MED ORDER — POLYETHYLENE GLYCOL 3350 17 G PO PACK: 17.0000 g | PACK | Freq: Every day | ORAL | Status: DC | PRN

## 2021-05-28 MED ORDER — FUROSEMIDE 10 MG/ML IJ SOLN
80.0000 mg | Freq: Two times a day (BID) | INTRAMUSCULAR | Status: DC
Start: 2021-05-28 — End: 2021-05-30
  Administered 2021-05-28 – 2021-05-30 (×5): 80 mg via INTRAVENOUS
  Filled 2021-05-28 (×5): qty 8

## 2021-05-28 MED ORDER — CHOLECALCIFEROL 10 MCG (400 UNIT) PO TABS
400.0000 [IU] | ORAL_TABLET | Freq: Every day | ORAL | Status: DC
  Administered 2021-05-28 – 2021-05-31 (×4): 400 [IU] via ORAL
  Filled 2021-05-28 (×4): qty 1

## 2021-05-28 MED ORDER — POTASSIUM CHLORIDE CRYS ER 20 MEQ PO TBCR
40.0000 meq | EXTENDED_RELEASE_TABLET | Freq: Once | ORAL | Status: AC
  Administered 2021-05-28: 17:00:00 40 meq via ORAL
  Filled 2021-05-28: qty 2

## 2021-05-28 NOTE — Progress Notes (Signed)
Mobility Specialist Progress Note:   05/28/21 1500  Mobility  Activity Ambulated in hall  Level of Assistance Standby assist, set-up cues, supervision of patient - no hands on  Assistive Device None  Distance Ambulated (ft) 470 ft  Mobility Ambulated with assistance in hallway  Mobility Response Tolerated well  Mobility performed by Mobility specialist  $Mobility charge 1 Mobility   Pt received in bd willing to participate in mobility. No complaints of pain. Pt returned to bed with call bell in reach and all need met.   [facility] Public librarian Phone [phone] Secondary Phone [phone]

## 2021-05-28 NOTE — Progress Notes (Addendum)
Advanced Heart Failure Rounding Note  PCP-Cardiologist: None   Subjective:    Scr improved, 1.65 > 1.6 > 1.3 > 1.1  Ca continues to trend down, 13.9 > 13 > 12.4 > 11.7 > 11.3  Received IV pamidronate + continues on IVF. Weight up 9 lb since admit.  Denies dyspnea. Has been up ambulating. Only complaint is constipation.  Atrial flutter 70s-80s on amio gtt at 30/hr   Objective:   Weight Range: 132 kg Body mass index is 51.55 kg/m.   Vital Signs:   Temp:  [98.2 F (36.8 C)-98.7 F (37.1 C)] 98.2 F (36.8 C) (11/17 0429) Pulse Rate:  [72-84] 73 (11/17 0429) Resp:  [16-19] 16 (11/17 0429) BP: (118-156)/(61-85) 148/77 (11/17 0429) SpO2:  [98 %-100 %] 100 % (11/17 0429) Weight:  [132 kg] 132 kg (11/17 0429) Last BM Date: 05/26/21  Weight change: Filed Weights   05/25/21 2346 05/27/21 0042 05/28/21 0429  Weight: 127.9 kg 129.9 kg 132 kg    Intake/Output:   Intake/Output Summary (Last 24 hours) at 05/28/2021 0707 Last data filed at 05/28/2021 0600 Gross per 24 hour  Intake 3166.01 ml  Output 1400 ml  Net 1766.01 ml      Physical Exam    General:  Well appearing. No resp difficulty HEENT: normal Neck: supple. JVP difficult to assess but appears to be ~ 8 cm. Carotids 2+ bilat; no bruits.  Cor: PMI nondisplaced. Irregular rhythm.  No rubs, gallops or murmurs. Lungs: clear Abdomen: soft, nontender, nondistended. No hepatosplenomegaly.  Extremities: no cyanosis, clubbing, rash, trace edema Neuro: alert & orientedx3, cranial nerves grossly intact. moves all 4 extremities w/o difficulty. Affect pleasant    Telemetry   AFL 70s-80s (personally reviewed)   Labs    CBC Recent Labs    05/25/21 2100 05/28/21 0238  WBC 8.4 6.8  NEUTROABS 5.5  --   HGB 12.3 10.9*  HCT 37.8 34.2*  MCV 94.5 94.5  PLT 460* 025*   Basic Metabolic Panel Recent Labs    05/26/21 0457 05/26/21 1055 05/27/21 0210 05/28/21 0238  NA  --    < > 131* 132*  K  --    < >  3.9 4.0  CL  --    < > 102 103  CO2  --    < > 23 22  GLUCOSE  --    < > 121* 98  BUN  --    < > 22* 14  CREATININE  --    < > 1.14* 1.14*  CALCIUM  --    < > 11.7*  11.8* 11.3*  MG 2.7*  --   --   --   PHOS 2.1*  --   --   --    < > = values in this interval not displayed.   Liver Function Tests Recent Labs    05/25/21 2100 05/27/21 0210  AST 21 23  ALT 25 24  ALKPHOS 89 83  BILITOT 0.7 0.4  PROT 8.2* 6.6  ALBUMIN 3.9 3.0*   No results for input(s): LIPASE, AMYLASE in the last 72 hours. Cardiac Enzymes No results for input(s): CKTOTAL, CKMB, CKMBINDEX, TROPONINI in the last 72 hours.  BNP: BNP (last 3 results) Recent Labs    05/03/21 2349  BNP 670.0*    ProBNP (last 3 results) No results for input(s): PROBNP in the last 8760 hours.   D-Dimer No results for input(s): DDIMER in the last 72 hours. Hemoglobin A1C No results for  input(s): HGBA1C in the last 72 hours. Fasting Lipid Panel Recent Labs    05/25/21 1414  CHOL 193  HDL 47.60  LDLCALC 125*  TRIG 102.0  CHOLHDL 4   Thyroid Function Tests Recent Labs    05/26/21 0457  TSH 3.322    Other results:   Imaging    No results found.   Medications:     Scheduled Medications:  apixaban  5 mg Oral BID   hydrALAZINE  37.5 mg Oral Q8H   isosorbide mononitrate  60 mg Oral Daily   polyethylene glycol  17 g Oral BID   senna-docusate  1 tablet Oral BID    Infusions:  amiodarone 30 mg/hr (05/28/21 0303)   lactated ringers 50 mL/hr at 05/27/21 1512    PRN Medications: acetaminophen **OR** acetaminophen, ondansetron **OR** ondansetron (ZOFRAN) IV    Patient Profile   50 y.o. 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 Recently discharged from hospital 11/03 and readmitted on 11/14 with AKI and hypercalcemia  Assessment/Plan   1. Hypercalcemia: Ultimate cause uncertain.  No thiazide diuretic, no Ca supplement or vitamin D,  no known malignancy and clear CXR.  Suspect there is an underlying predisposition to hypercalcemia with precipitation of marked hypercalcemia by aggressive diuresis/volume depletion/AKI.   - She has received pamidronate IV, Ca improved from 13.9 > 11.7 > 11.3 - IVF 100 cc/hr since 11/15 am, decreased to 50/hr yesterday afternoon. Weight trending up. Will stop IV fluids and start IV lasix. - Need workup for hyperparathyroidism => PTH elevated at 91.  - Less likely malignancy 1, 25 hydroxyvitamin D level low, PTHrp, myeloma panel, and urine immunofixation pending.  - Management per TRH  2. AKI: Creatinine 0.9 at baseline => 1.63 in setting of diuresis with acute systolic CHF and multiple medications.  Scr improved to 1.1. - Holding Torsemide, spiro, losartan and digoxin - IV lasix today (see below)  3. Chronic systolic CHF: Nonischemic CMP, ?tachy-mediated.  Last study was TEE in 11/22 showing E 25-30%, normal RV, mild MR.   - Holding torsemide as above. Discontinue IV fluids. Weight now up 9 lb. Start IV lasix 80 mg BID. Watch renal function/electrolytes. BMET this afternoon. - With AKI, holding losartan, spironolactone, Wilder Glade. Add back slowly if renal function remains stable with diuresis - Increased risk digoxin toxicity with hyperCa, continue holding digoxin.  - Continue hydralazine/Imdur for now.   4. Atrial fibrillation with RVR/now AFL with RVR: Failed DCCV last admission.  Has been on amiodarone.  - Rate controlled with amio gtt at 30/hr - Continue Eliquis.  Has not missed any doses of Eliquis at home - Now that she has loaded more amiodarone, will plan for DCCV 11/18   Deconditioning PT/OT CR  TOC following    Length of Stay: 2  FINCH, LINDSAY N, PA-C  05/28/2021, 7:07 AM  Advanced Heart Failure Team Pager [phone] (M-F; 7a - 5p)  Please contact [facility] for night-coverage after hours (5p -7a ) and weekends on [email or web address]  Patient seen and examined with the  above-signed Advanced Practice Provider and/or Housestaff. I personally reviewed laboratory data, imaging studies and relevant notes. I independently examined the patient and formulated the important aspects of the plan. I have edited the note to reflect any of my changes or salient points. I have personally discussed the plan with the patient and/or family.  Continues to receive IVF. Weight up 9 pounds. Denies SOB, orthopnea or PND. Serum Ca improving. Remains in AF on  IV amio.   General:  Sitting up in bed  No resp difficulty HEENT: normal Neck: supple. JVP hard to see. Looks up  Carotids 2+ bilat; no bruits. No lymphadenopathy or thryomegaly appreciated. Cor: PMI nondisplaced. Irregular rate & rhythm. No rubs, gallops or murmurs. Lungs: clear Abdomen: soft, nontender, nondistended. No hepatosplenomegaly. No bruits or masses. Good bowel sounds. Extremities: no cyanosis, clubbing, rash, trace edema Neuro: alert & orientedx3, cranial nerves grossly intact. moves all 4 extremities w/o difficulty. Affect pleasant  Volume up. Will start IV lasix (will also help lower calcium via calciuric effects). Continue IV amio and Eliquis. Plan repeat attempt at [facility] tomorrow.   Glori Bickers, MD  3:33 PM

## 2021-05-28 NOTE — Progress Notes (Signed)
Progress Note    Christine Oneal  TWS:[id number] DOB: 24-May-1971  DOA: 05/25/2021 PCP: Maximiano Coss, NP    Brief Narrative:     Medical records reviewed and are as summarized below:  Christine Oneal is an 50 y.o. female  with medical history significant of HTN, recently diagnosed HFrEF with EF 20%, also A.Fib.  Pt just hospitalized earlier this month.  Started on numerous meds and diuretics, discharged. Pt went to PCP for follow up.  Having some constipation and palpitations (a.Fib), but no other new symptoms. At PCP labs showed hypercalcemia and AKI.  Pt sent in to ED.   Assessment/Plan:   Principal Problem:   Hypercalcemia Active Problems:   Essential hypertension   Unspecified atrial fibrillation (HCC)   Chronic systolic CHF (congestive heart failure) (HCC)   AKI (acute kidney injury) (HCC)   Hypercalcemia - -Pt not on thiazide diuretic at baseline to explain her hypercalcemia. -PTH elevated to 91, U/S of thyroid shows: ? Posterior to the left thyroid lobe, there is a hypoechoic ovoid lesion that measures up to approximately 2 cm in size. Differential considerations include enlarged parathyroid gland versus lymph node. 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 -Cont eliquis  Chronic systolic CHF - -Cont imdur -Cont hydralazine -cards consult- resume diuretic  Obesity morbid Body mass index is 51.55 kg/m.   Family Communication/Anticipated D/C date and plan/Code Status   DVT prophylaxis: Lovenox ordered. Code Status: Full Code.  Disposition Plan: Status is: Inpatient  Remains inpatient appropriate because: calcium elevated          Medical Consultants:    cards    Subjective:  No sOB, no CP  Objective:    Vitals:   05/27/21 1955 05/28/21 0001 05/28/21 0429 05/28/21 1149  BP: 127/61 121/72 (!) 148/77 (!) 134/96  Pulse: 84 77 73 96  Resp: 18  16 18   Temp: 98.7 F (37.1 C) 98.5 F (36.9 C) 98.2 F (36.8 C) 98.6 F (37 C)  TempSrc: Oral Oral Oral Oral  SpO2: 100% 98% 100% 100%  Weight:   132 kg   Height:        Intake/Output Summary (Last 24 hours) at 05/28/2021 1301 Last data filed at 05/28/2021 1238 Gross per 24 hour  Intake 1663.58 ml  Output 3200 ml  Net -1536.42 ml   Filed Weights   05/25/21 2346 05/27/21 0042 05/28/21 0429  Weight: 127.9 kg 129.9 kg 132 kg    Exam:  General: Appearance:    Severely obese female in no acute distress     Lungs:     respirations unlabored  Heart:    Normal heart rate.   MS:   All extremities are intact.    Neurologic:   Awake, alert, oriented x 3     Data Reviewed:   I have personally reviewed following labs and imaging studies:  Labs: Labs show the following:   Basic Metabolic Panel: Recent Labs  Lab 05/25/21 1414 05/25/21 2100 05/26/21 0457 05/26/21 1055 05/26/21 1212 05/27/21 0210 05/28/21 0238  NA 140 140  --  134*  --  131* 132*  K 4.9 4.2  --  4.1  --  3.9 4.0  CL 104 106  --  104  --  102 103  CO2 27 25  --  23  --  23 22  GLUCOSE 96 101*  --  144*  --  121* 98  BUN 29* 26*  --  20  --  22* 14  CREATININE 1.65* 1.63*  --  1.28*  --  1.14* 1.14*  CALCIUM 13.9* 13.5*  --  12.8* 12.4* 11.7*  11.8* 11.3*  MG  --   --  2.7*  --   --   --   --   PHOS  --   --  2.1*  --   --   --   --    GFR Estimated Creatinine Clearance: 77.6 mL/min (A) (by C-G formula based on SCr of 1.14 mg/dL (H)). Liver Function Tests: Recent Labs  Lab 05/25/21 1414 05/25/21 2100 05/27/21 0210  AST 19 21 23   ALT 22 25 24   ALKPHOS 105 89 83  BILITOT 1.0 0.7 0.4  PROT 8.1 8.2* 6.6  ALBUMIN 4.3 3.9 3.0*   No results for input(s): LIPASE, AMYLASE in the last 168  hours. No results for input(s): AMMONIA in the last 168 hours. Coagulation profile No results for input(s): INR, PROTIME in the last 168 hours.  CBC: Recent Labs  Lab 05/25/21 2100 05/28/21 0238  WBC 8.4 6.8  NEUTROABS 5.5  --   HGB 12.3 10.9*  HCT 37.8 34.2*  MCV 94.5 94.5  PLT 460* 414*   Cardiac Enzymes: No results for input(s): CKTOTAL, CKMB, CKMBINDEX, TROPONINI in the last 168 hours. BNP (last 3 results) No results for input(s): PROBNP in the last 8760 hours. CBG: No results for input(s): GLUCAP in the last 168 hours. D-Dimer: No results for input(s): DDIMER in the last 72 hours. Hgb A1c: No results for input(s): HGBA1C in the last 72 hours. Lipid Profile: Recent Labs    05/25/21 1414  CHOL 193  HDL 47.60  LDLCALC 125*  TRIG 102.0  CHOLHDL 4   Thyroid function studies: Recent Labs    05/26/21 0457  TSH 3.322   Anemia work up: No results for input(s): VITAMINB12, FOLATE, FERRITIN, TIBC, IRON, RETICCTPCT in the last 72 hours. Sepsis Labs: Recent Labs  Lab 05/25/21 2100 05/28/21 0238  WBC 8.4 6.8    Microbiology Recent Results (from the past 240 hour(s))  Resp Panel by RT-PCR (Flu A&B, Covid) Nasopharyngeal Swab     Status: None   Collection Time: 05/26/21  3:24 AM   Specimen: Nasopharyngeal Swab; Nasopharyngeal(NP) swabs in vial transport medium  Result Value Ref Range Status   SARS Coronavirus 2 by RT PCR NEGATIVE NEGATIVE Final    Comment: (NOTE) SARS-CoV-2 target nucleic acids are NOT DETECTED.  The SARS-CoV-2 RNA is generally detectable in upper respiratory specimens during the acute phase of infection. The lowest concentration of SARS-CoV-2 viral copies this assay can detect is 138 copies/mL. A negative result does not preclude SARS-Cov-2 infection and should not be used as the sole basis for treatment or other patient management decisions. A negative result may occur with  improper specimen collection/handling, submission of specimen  other than nasopharyngeal swab, presence of viral mutation(s) within the areas targeted by this assay, and inadequate number of viral copies(<138 copies/mL). A negative result must be combined with clinical observations, patient history, and epidemiological information. The expected result is Negative.  Fact Sheet for Patients:  EntrepreneurPulse.com.au  Fact Sheet for Healthcare Providers:  IncredibleEmployment.be  This test is no t yet approved or cleared by the [place] and  has been authorized for  detection and/or diagnosis of SARS-CoV-2 by FDA under an Emergency Use Authorization (EUA). This EUA will remain  in effect (meaning this test can be used) for the duration of the COVID-19 declaration under Section 564(b)(1) of the Act, 21 U.S.C.section 360bbb-3(b)(1), unless the authorization is terminated  or revoked sooner.       Influenza A by PCR NEGATIVE NEGATIVE Final   Influenza B by PCR NEGATIVE NEGATIVE Final    Comment: (NOTE) The Xpert Xpress SARS-CoV-2/FLU/RSV plus assay is intended as an aid in the diagnosis of influenza from Nasopharyngeal swab specimens and should not be used as a sole basis for treatment. Nasal washings and aspirates are unacceptable for Xpert Xpress SARS-CoV-2/FLU/RSV testing.  Fact Sheet for Patients: EntrepreneurPulse.com.au  Fact Sheet for Healthcare Providers: IncredibleEmployment.be  This test is not yet approved or cleared by the [place] FDA and has been authorized for detection and/or diagnosis of SARS-CoV-2 by FDA under an Emergency Use Authorization (EUA). This EUA will remain in effect (meaning this test can be used) for the duration of the COVID-19 declaration under Section 564(b)(1) of the Act, 21 U.S.C. section 360bbb-3(b)(1), unless the authorization is terminated or revoked.  Performed at [facility], [place] [address], [place],  [place] [address]     Procedures and diagnostic studies:  US THYROID  Result Date: 05/28/2021 CLINICAL DATA:  Hypercalcemia EXAM: THYROID ULTRASOUND TECHNIQUE: Ultrasound examination of the thyroid gland and adjacent soft tissues was performed. COMPARISON:  None. FINDINGS: Parenchymal Echotexture: Mildly heterogenous Isthmus: 0.4 cm Right lobe: 4.6 x 1.6 x 1.5 cm Left lobe: 3.8 x 1.5 x 4 cm _________________________________________________________ Estimated total number of nodules >/= 1 cm: 0 Number of spongiform nodules >/=  2 cm not described below (TR1): 0 Number of mixed cystic and solid nodules >/= 1.5 cm not described below (TR2): 0 _________________________________________________________ Subcentimeter hypoechoic nodule in the posterior aspect the right thyroid lobe does not meet criteria for further dedicated follow-up or biopsy. No other discrete nodules are seen within the thyroid gland. Posterior to the left thyroid lobe, there is a hypoechoic ovoid lesion the measures up to 2.1 x 0.8 x 1.0 cm. IMPRESSION: 1. Normal sonographic appearance of the thyroid gland. 2. Posterior to the left thyroid lobe, there is a hypoechoic ovoid lesion that measures up to approximately 2 cm in size. Differential considerations include enlarged parathyroid gland versus lymph node. Electronically Signed   By: Albin Felling M.D.   On: 05/28/2021 12:26    Medications:    apixaban  5 mg Oral BID   cholecalciferol  400 Units Oral Daily   furosemide  80 mg Intravenous BID   hydrALAZINE  37.5 mg Oral Q8H   isosorbide mononitrate  60 mg Oral Daily   polyethylene glycol  17 g Oral BID   senna-docusate  1 tablet Oral BID   Continuous Infusions:  amiodarone 30 mg/hr (05/28/21 0303)     LOS: 2 days   Geradine Girt  [facility]   How to contact the [facility] Attending or Consulting provider [place] or covering provider during after hours [place], for this patient?  Check the care team in [facility] and look for a)  attending/consulting [facility] provider listed and b) the [facility] team listed Log into [email or web address] and use [place] universal password to access. If you do not have the password, please contact the hospital operator. Locate the [facility] provider you are looking for under [facility] and page to a number that you  can be directly reached. If you still have difficulty reaching the provider, please page the [facility] (Director on Call) for the Hospitalists listed on amion for assistance.  05/28/2021, 1:01 PM

## 2021-05-28 NOTE — H&P (View-Only) (Signed)
Advanced Heart Failure Rounding Note  PCP-Cardiologist: None   Subjective:    Scr improved, 1.65 > 1.6 > 1.3 > 1.1  Ca continues to trend down, 13.9 > 13 > 12.4 > 11.7 > 11.3  Received IV pamidronate + continues on IVF. Weight up 9 lb since admit.  Denies dyspnea. Has been up ambulating. Only complaint is constipation.  Atrial flutter 70s-80s on amio gtt at 30/hr   Objective:   Weight Range: 132 kg Body mass index is 51.55 kg/m.   Vital Signs:   Temp:  [98.2 F (36.8 C)-98.7 F (37.1 C)] 98.2 F (36.8 C) (11/17 0429) Pulse Rate:  [72-84] 73 (11/17 0429) Resp:  [16-19] 16 (11/17 0429) BP: (118-156)/(61-85) 148/77 (11/17 0429) SpO2:  [98 %-100 %] 100 % (11/17 0429) Weight:  [132 kg] 132 kg (11/17 0429) Last BM Date: 05/26/21  Weight change: Filed Weights   05/25/21 2346 05/27/21 0042 05/28/21 0429  Weight: 127.9 kg 129.9 kg 132 kg    Intake/Output:   Intake/Output Summary (Last 24 hours) at 05/28/2021 0707 Last data filed at 05/28/2021 0600 Gross per 24 hour  Intake 3166.01 ml  Output 1400 ml  Net 1766.01 ml      Physical Exam    General:  Well appearing. No resp difficulty HEENT: normal Neck: supple. JVP difficult to assess but appears to be ~ 8 cm. Carotids 2+ bilat; no bruits.  Cor: PMI nondisplaced. Irregular rhythm.  No rubs, gallops or murmurs. Lungs: clear Abdomen: soft, nontender, nondistended. No hepatosplenomegaly.  Extremities: no cyanosis, clubbing, rash, trace edema Neuro: alert & orientedx3, cranial nerves grossly intact. moves all 4 extremities w/o difficulty. Affect pleasant    Telemetry   AFL 70s-80s (personally reviewed)   Labs    CBC Recent Labs    05/25/21 2100 05/28/21 0238  WBC 8.4 6.8  NEUTROABS 5.5  --   HGB 12.3 10.9*  HCT 37.8 34.2*  MCV 94.5 94.5  PLT 460* 767*   Basic Metabolic Panel Recent Labs    05/26/21 0457 05/26/21 1055 05/27/21 0210 05/28/21 0238  NA  --    < > 131* 132*  K  --    < >  3.9 4.0  CL  --    < > 102 103  CO2  --    < > 23 22  GLUCOSE  --    < > 121* 98  BUN  --    < > 22* 14  CREATININE  --    < > 1.14* 1.14*  CALCIUM  --    < > 11.7*  11.8* 11.3*  MG 2.7*  --   --   --   PHOS 2.1*  --   --   --    < > = values in this interval not displayed.   Liver Function Tests Recent Labs    05/25/21 2100 05/27/21 0210  AST 21 23  ALT 25 24  ALKPHOS 89 83  BILITOT 0.7 0.4  PROT 8.2* 6.6  ALBUMIN 3.9 3.0*   No results for input(s): LIPASE, AMYLASE in the last 72 hours. Cardiac Enzymes No results for input(s): CKTOTAL, CKMB, CKMBINDEX, TROPONINI in the last 72 hours.  BNP: BNP (last 3 results) Recent Labs    05/03/21 2349  BNP 670.0*    ProBNP (last 3 results) No results for input(s): PROBNP in the last 8760 hours.   D-Dimer No results for input(s): DDIMER in the last 72 hours. Hemoglobin A1C No results for  input(s): HGBA1C in the last 72 hours. Fasting Lipid Panel Recent Labs    05/25/21 1414  CHOL 193  HDL 47.60  LDLCALC 125*  TRIG 102.0  CHOLHDL 4   Thyroid Function Tests Recent Labs    05/26/21 0457  TSH 3.322    Other results:   Imaging    No results found.   Medications:     Scheduled Medications:  apixaban  5 mg Oral BID   hydrALAZINE  37.5 mg Oral Q8H   isosorbide mononitrate  60 mg Oral Daily   polyethylene glycol  17 g Oral BID   senna-docusate  1 tablet Oral BID    Infusions:  amiodarone 30 mg/hr (05/28/21 0303)   lactated ringers 50 mL/hr at 05/27/21 1512    PRN Medications: acetaminophen **OR** acetaminophen, ondansetron **OR** ondansetron (ZOFRAN) IV    Patient Profile   50 y.o. 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 Recently discharged from hospital 11/03 and readmitted on 11/14 with AKI and hypercalcemia  Assessment/Plan   1. Hypercalcemia: Ultimate cause uncertain.  No thiazide diuretic, no Ca supplement or vitamin D,  no known malignancy and clear CXR.  Suspect there is an underlying predisposition to hypercalcemia with precipitation of marked hypercalcemia by aggressive diuresis/volume depletion/AKI.   - She has received pamidronate IV, Ca improved from 13.9 > 11.7 > 11.3 - IVF 100 cc/hr since 11/15 am, decreased to 50/hr yesterday afternoon. Weight trending up. Will stop IV fluids and start IV lasix. - Need workup for hyperparathyroidism => PTH elevated at 91.  - Less likely malignancy 1, 25 hydroxyvitamin D level low, PTHrp, myeloma panel, and urine immunofixation pending.  - Management per TRH  2. AKI: Creatinine 0.9 at baseline => 1.63 in setting of diuresis with acute systolic CHF and multiple medications.  Scr improved to 1.1. - Holding Torsemide, spiro, losartan and digoxin - IV lasix today (see below)  3. Chronic systolic CHF: Nonischemic CMP, ?tachy-mediated.  Last study was TEE in 11/22 showing E 25-30%, normal RV, mild MR.   - Holding torsemide as above. Discontinue IV fluids. Weight now up 9 lb. Start IV lasix 80 mg BID. Watch renal function/electrolytes. BMET this afternoon. - With AKI, holding losartan, spironolactone, [place]. Add back slowly if renal function remains stable with diuresis - Increased risk digoxin toxicity with hyperCa, continue holding digoxin.  - Continue hydralazine/Imdur for now.   4. Atrial fibrillation with RVR/now AFL with RVR: Failed DCCV last admission.  Has been on amiodarone.  - Rate controlled with amio gtt at 30/hr - Continue Eliquis.  Has not missed any doses of Eliquis at home - Now that she has loaded more amiodarone, will plan for DCCV 11/18   Deconditioning PT/OT CR  TOC following    Length of Stay: 2  FINCH, LINDSAY N, PA-C  05/28/2021, 7:07 AM  Advanced Heart Failure Team Pager [phone] (M-F; 7a - 5p)  Please contact [facility] for night-coverage after hours (5p -7a ) and weekends on [email or web address]  Patient seen and examined with the  above-signed Advanced Practice Provider and/or Housestaff. I personally reviewed laboratory data, imaging studies and relevant notes. I independently examined the patient and formulated the important aspects of the plan. I have edited the note to reflect any of my changes or salient points. I have personally discussed the plan with the patient and/or family.  Continues to receive IVF. Weight up 9 pounds. Denies SOB, orthopnea or PND. Serum Ca improving. Remains in AF on  IV amio.   General:  Sitting up in bed  No resp difficulty HEENT: normal Neck: supple. JVP hard to see. Looks up  Carotids 2+ bilat; no bruits. No lymphadenopathy or thryomegaly appreciated. Cor: PMI nondisplaced. Irregular rate & rhythm. No rubs, gallops or murmurs. Lungs: clear Abdomen: soft, nontender, nondistended. No hepatosplenomegaly. No bruits or masses. Good bowel sounds. Extremities: no cyanosis, clubbing, rash, trace edema Neuro: alert & orientedx3, cranial nerves grossly intact. moves all 4 extremities w/o difficulty. Affect pleasant  Volume up. Will start IV lasix (will also help lower calcium via calciuric effects). Continue IV amio and Eliquis. Plan repeat attempt at [facility] tomorrow.   Glori Bickers, MD  3:33 PM

## 2021-05-29 ENCOUNTER — Encounter (HOSPITAL_COMMUNITY)
Admission: EM | Disposition: A | Discharge status: Home Health | Payer: Self-pay | Source: Ambulatory Visit | Attending: Internal Medicine

## 2021-05-29 ENCOUNTER — Inpatient Hospital Stay (HOSPITAL_COMMUNITY): Payer: 59 | Admitting: Anesthesiology

## 2021-05-29 ENCOUNTER — Encounter (HOSPITAL_COMMUNITY): Payer: Self-pay | Admitting: Internal Medicine

## 2021-05-29 HISTORY — PX: CARDIOVERSION: SHX1299

## 2021-05-29 LAB — CBC
HCT: 31.1 % — ABNORMAL LOW (ref 36.0–46.0)
Hemoglobin: 10.1 g/dL — ABNORMAL LOW (ref 12.0–15.0)
MCH: 29.9 pg (ref 26.0–34.0)
MCHC: 32.5 g/dL (ref 30.0–36.0)
MCV: 92 fL (ref 80.0–100.0)
Platelets: 390 10*3/uL (ref 150–400)
RBC: 3.38 MIL/uL — ABNORMAL LOW (ref 3.87–5.11)
RDW: 14.7 % (ref 11.5–15.5)
WBC: 6.7 10*3/uL (ref 4.0–10.5)
nRBC: 0 % (ref 0.0–0.2)

## 2021-05-29 LAB — BASIC METABOLIC PANEL
Anion gap: 8 (ref 5–15)
BUN: 11 mg/dL (ref 6–20)
CO2: 24 mmol/L (ref 22–32)
Calcium: 10.4 mg/dL — ABNORMAL HIGH (ref 8.9–10.3)
Chloride: 102 mmol/L (ref 98–111)
Creatinine, Ser: 1.05 mg/dL — ABNORMAL HIGH (ref 0.44–1.00)
GFR, Estimated: >60 mL/min (ref >60)
Glucose, Bld: 108 mg/dL — ABNORMAL HIGH (ref 70–99)
Potassium: 3.4 mmol/L — ABNORMAL LOW (ref 3.5–5.1)
Sodium: 134 mmol/L — ABNORMAL LOW (ref 135–145)

## 2021-05-29 SURGERY — CARDIOVERSION
Anesthesia: General

## 2021-05-29 MED ORDER — SPIRONOLACTONE 12.5 MG HALF TABLET
12.5000 mg | ORAL_TABLET | Freq: Every day | ORAL | Status: DC
  Administered 2021-05-29 – 2021-05-30 (×2): 12.5 mg via ORAL
  Filled 2021-05-29 (×3): qty 1

## 2021-05-29 MED ORDER — PROPOFOL 10 MG/ML IV BOLUS
INTRAVENOUS | Status: DC | PRN
  Administered 2021-05-29: 50 mg via INTRAVENOUS
  Administered 2021-05-29: 100 mg via INTRAVENOUS

## 2021-05-29 MED ORDER — LIDOCAINE 2% (20 MG/ML) 5 ML SYRINGE
INTRAMUSCULAR | Status: DC | PRN
  Administered 2021-05-29: 100 mg via INTRAVENOUS

## 2021-05-29 MED ORDER — SODIUM CHLORIDE 0.9 % IV SOLN: INTRAVENOUS | Status: DC | PRN

## 2021-05-29 NOTE — Progress Notes (Addendum)
Progress Note    Christine Oneal  VHQ:[id number] DOB: 1970/09/09  DOA: 05/25/2021 PCP: Maximiano Coss, NP    Brief Narrative:     Medical records reviewed and are as summarized below:  Christine Oneal is an 50 y.o. female  with medical history significant of HTN, recently diagnosed HFrEF with EF 20%, also A.Fib.  Pt just hospitalized earlier this month.  Started on numerous meds and diuretics, discharged. Pt went to PCP for follow up.  Having some constipation and palpitations (a.Fib), but no other new symptoms. At PCP labs showed hypercalcemia and AKI.  Pt sent in to ED.   Assessment/Plan:   Principal Problem:   Hypercalcemia Active Problems:   Essential hypertension   Unspecified atrial fibrillation (HCC)   Chronic systolic CHF (congestive heart failure) (HCC)   AKI (acute kidney injury) (HCC)   Hypercalcemia - -Pt not on thiazide diuretic at baseline to explain her hypercalcemia. -PTH elevated to 91, U/S of thyroid shows: ? Posterior to the left thyroid lobe, there is a hypoechoic ovoid lesion that measures up to approximately 2 cm in size. Differential considerations include enlarged parathyroid gland versus lymph node. 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 -Cont eliquis -s/p cardioversion  Chronic systolic CHF - -Cont imdur -Cont hydralazine -cards consult- resume diuretic  Obesity morbid Body mass index is 51.11 kg/m.  Hyponatremia -very mild  Family Communication/Anticipated D/C date and plan/Code Status   DVT prophylaxis: Lovenox ordered. Code Status: Full Code.  Disposition Plan: Status is: Inpatient  Remains inpatient appropriate because: calcium elevated , home in  AM?         Medical Consultants:   cards    Subjective:  No complaints  Objective:    Vitals:   05/29/21 0739 05/29/21 0851 05/29/21 0901 05/29/21 1148  BP:  (!) 129/56 (!) 150/69 126/66  Pulse:  84 71 73  Resp:  (!) 21 19 18   Temp: 98.2 F (36.8 C) 97.6 F (36.4 C)  98.1 F (36.7 C)  TempSrc: Other (Comment) Temporal  Oral  SpO2:   100% 100%  Weight:      Height:        Intake/Output Summary (Last 24 hours) at 05/29/2021 1411 Last data filed at 05/29/2021 1333 Gross per 24 hour  Intake 1329.26 ml  Output 2350 ml  Net -1020.74 ml   Filed Weights   05/27/21 0042 05/28/21 0429 05/29/21 0432  Weight: 129.9 kg 132 kg 130.9 kg    Exam:  General: Appearance:    Severely obese female in no acute distress     Lungs:     respirations unlabored  Heart:    Normal heart rate. .   MS:   All extremities are intact.    Neurologic:   Awake, alert, oriented x 3. No apparent focal neurological           defect.        Data Reviewed:   I have personally reviewed following labs and imaging studies:  Labs: Labs show the following:   Basic Metabolic Panel: Recent Labs  Lab 05/26/21 0457 05/26/21 1055 05/26/21 1212 05/27/21 0210 05/28/21 0238 05/28/21 1230 05/29/21 0248  NA  --  134*  --  131* 132* 132* 134*  K  --  4.1  --  3.9 4.0 3.2* 3.4*  CL  --  104  --  102 103 101 102  CO2  --  23  --  23 22 22 24   GLUCOSE  --  144*  --  121* 98 127* 108*  BUN  --  20  --  22* 14 11 11   CREATININE  --  1.28*  --  1.14* 1.14* 1.01* 1.05*  CALCIUM  --  12.8* 12.4* 11.7*  11.8* 11.3* 11.2* 10.4*  MG 2.7*  --   --   --   --   --   --   PHOS 2.1*  --   --   --   --   --   --    GFR Estimated Creatinine Clearance: 83.9 mL/min (A) (by C-G formula based on SCr of 1.05 mg/dL (H)). Liver Function Tests: Recent Labs  Lab 05/25/21 1414 05/25/21 2100 05/27/21 0210  AST 19 21 23   ALT 22 25 24   ALKPHOS 105 89 83  BILITOT 1.0 0.7 0.4  PROT 8.1 8.2* 6.6  ALBUMIN  4.3 3.9 3.0*   No results for input(s): LIPASE, AMYLASE in the last 168 hours. No results for input(s): AMMONIA in the last 168 hours. Coagulation profile No results for input(s): INR, PROTIME in the last 168 hours.  CBC: Recent Labs  Lab 05/25/21 2100 05/28/21 0238 05/29/21 0248  WBC 8.4 6.8 6.7  NEUTROABS 5.5  --   --   HGB 12.3 10.9* 10.1*  HCT 37.8 34.2* 31.1*  MCV 94.5 94.5 92.0  PLT 460* 414* 390   Cardiac Enzymes: No results for input(s): CKTOTAL, CKMB, CKMBINDEX, TROPONINI in the last 168 hours. BNP (last 3 results) No results for input(s): PROBNP in the last 8760 hours. CBG: No results for input(s): GLUCAP in the last 168 hours. D-Dimer: No results for input(s): DDIMER in the last 72 hours. Hgb A1c: No results for input(s): HGBA1C in the last 72 hours. Lipid Profile: No results for input(s): CHOL, HDL, LDLCALC, TRIG, CHOLHDL, LDLDIRECT in the last 72 hours.  Thyroid function studies: No results for input(s): TSH, T4TOTAL, T3FREE, THYROIDAB in the last 72 hours.  Invalid input(s): FREET3  Anemia work up: No results for input(s): VITAMINB12, FOLATE, FERRITIN, TIBC, IRON, RETICCTPCT in the last 72 hours. Sepsis Labs: Recent Labs  Lab 05/25/21 2100 05/28/21 0238 05/29/21 0248  WBC 8.4 6.8 6.7    Microbiology Recent Results (from the past 240 hour(s))  Resp Panel by RT-PCR (Flu A&B, Covid) Nasopharyngeal Swab     Status: None   Collection Time: 05/26/21  3:24 AM   Specimen: Nasopharyngeal Swab; Nasopharyngeal(NP) swabs in vial transport medium  Result Value Ref Range Status   SARS Coronavirus 2 by RT PCR NEGATIVE NEGATIVE Final    Comment: (NOTE) SARS-CoV-2 target nucleic acids are NOT DETECTED.  The SARS-CoV-2 RNA is generally detectable in upper respiratory specimens during the acute phase of infection. The lowest concentration of SARS-CoV-2 viral copies this assay can detect is 138 copies/mL. A negative result does not preclude  SARS-Cov-2 infection and should not be used as the sole basis for treatment or other patient management decisions. A negative result may occur with  improper specimen collection/handling, submission of specimen other than nasopharyngeal swab, presence of viral mutation(s) within the areas targeted by this assay, and inadequate number of viral copies(<138 copies/mL). A negative result must be combined with clinical observations, patient history, and epidemiological information. The expected result is Negative.  Fact Sheet  for Patients:  EntrepreneurPulse.com.au  Fact Sheet for Healthcare Providers:  IncredibleEmployment.be  This test is no t yet approved or cleared by the [place] FDA and  has been authorized for detection and/or diagnosis of SARS-CoV-2 by FDA under an Emergency Use Authorization (EUA). This EUA will remain  in effect (meaning this test can be used) for the duration of the COVID-19 declaration under Section 564(b)(1) of the Act, 21 U.S.C.section 360bbb-3(b)(1), unless the authorization is terminated  or revoked sooner.       Influenza A by PCR NEGATIVE NEGATIVE Final   Influenza B by PCR NEGATIVE NEGATIVE Final    Comment: (NOTE) The Xpert Xpress SARS-CoV-2/FLU/RSV plus assay is intended as an aid in the diagnosis of influenza from Nasopharyngeal swab specimens and should not be used as a sole basis for treatment. Nasal washings and aspirates are unacceptable for Xpert Xpress SARS-CoV-2/FLU/RSV testing.  Fact Sheet for Patients: EntrepreneurPulse.com.au  Fact Sheet for Healthcare Providers: IncredibleEmployment.be  This test is not yet approved or cleared by the [place] FDA and has been authorized for detection and/or diagnosis of SARS-CoV-2 by FDA under an Emergency Use Authorization (EUA). This EUA will remain in effect (meaning this test can be used) for the duration of  the COVID-19 declaration under Section 564(b)(1) of the Act, 21 U.S.C. section 360bbb-3(b)(1), unless the authorization is terminated or revoked.  Performed at [facility], [place] [address] [place], [place] [address]     Procedures and diagnostic studies:  US THYROID  Result Date: 05/28/2021 CLINICAL DATA:  Hypercalcemia EXAM: THYROID ULTRASOUND TECHNIQUE: Ultrasound examination of the thyroid gland and adjacent soft tissues was performed. COMPARISON:  None. FINDINGS: Parenchymal Echotexture: Mildly heterogenous Isthmus: 0.4 cm Right lobe: 4.6 x 1.6 x 1.5 cm Left lobe: 3.8 x 1.5 x 4 cm _________________________________________________________ Estimated total number of nodules >/= 1 cm: 0 Number of spongiform nodules >/=  2 cm not described below (TR1): 0 Number of mixed cystic and solid nodules >/= 1.5 cm not described below (TR2): 0 _________________________________________________________ Subcentimeter hypoechoic nodule in the posterior aspect the right thyroid lobe does not meet criteria for further dedicated follow-up or biopsy. No other discrete nodules are seen within the thyroid gland. Posterior to the left thyroid lobe, there is a hypoechoic ovoid lesion the measures up to 2.1 x 0.8 x 1.0 cm. IMPRESSION: 1. Normal sonographic appearance of the thyroid gland. 2. Posterior to the left thyroid lobe, there is a hypoechoic ovoid lesion that measures up to approximately 2 cm in size. Differential considerations include enlarged parathyroid gland versus lymph node. Electronically Signed   By: Albin Felling M.D.   On: 05/28/2021 12:26    Medications:    apixaban  5 mg Oral BID   cholecalciferol  400 Units Oral Daily   furosemide  80 mg Intravenous BID   hydrALAZINE  37.5 mg Oral Q8H   isosorbide mononitrate  60 mg Oral Daily   polyethylene glycol  17 g Oral BID   senna-docusate  1 tablet Oral BID   spironolactone  12.5 mg Oral Daily   Continuous Infusions:  amiodarone 30 mg/hr  (05/29/21 1258)     LOS: 3 days   Geradine Girt  [facility]   How to contact the [facility] Attending or Consulting provider [place] or covering provider during after hours [place], for this patient?  Check the care team in [facility] and look for a) attending/consulting [facility] provider listed and b) the [facility] team listed  Log into [email or web address] and use [place]'s universal password to access. If you do not have the password, please contact the hospital operator. Locate the [facility] provider you are looking for under [facility] and page to a number that you can be directly reached. If you still have difficulty reaching the provider, please page the [facility] (Director on Call) for the Hospitalists listed on amion for assistance.  05/29/2021, 2:11 PM

## 2021-05-29 NOTE — Anesthesia Preprocedure Evaluation (Addendum)
Anesthesia Evaluation  Patient identified by MRN, date of birth, ID band Patient awake    Reviewed: Allergy & Precautions, NPO status , Patient's Chart, lab work & pertinent test results, reviewed documented beta blocker date and time   Airway Mallampati: I  TM Distance: >3 FB Neck ROM: Full    Dental  (+) Dental Advisory Given, Missing   Pulmonary asthma ,    Pulmonary exam normal breath sounds clear to auscultation       Cardiovascular hypertension, Pt. on home beta blockers and Pt. on medications +CHF  + dysrhythmias Atrial Fibrillation  Rhythm:Irregular Rate:Abnormal  ECHO 11/1 1. Left ventricular ejection fraction, by estimation, is 25 to 30%. The  left ventricle has severely decreased function. The left ventricle  demonstrates global hypokinesis.  2. Right ventricular systolic function is normal. The right ventricular  size is normal.  3. Left atrial size was moderately dilated. No left atrial/left atrial  appendage thrombus was detected.  4. Right atrial size was moderately dilated.  5. The mitral valve is normal in structure. Mild mitral valve  regurgitation.  6. The aortic valve is normal in structure. Aortic valve regurgitation is  trivial.  7. There is mild (Grade II) plaque involving the descending aorta.  8. Evidence of atrial level shunting detected by color flow Doppler.  There is a small patent foramen ovale.    Neuro/Psych negative neurological ROS     GI/Hepatic negative GI ROS, Neg liver ROS,   Endo/Other  Morbid obesity (BMI 52)  Renal/GU negative Renal ROS     Musculoskeletal negative musculoskeletal ROS (+)   Abdominal   Peds  Hematology  (+) Blood dyscrasia, anemia ,   Anesthesia Other Findings   Reproductive/Obstetrics                            Anesthesia Physical  Anesthesia Plan  ASA: 4  Anesthesia Plan: General   Post-op Pain Management:     Induction: Intravenous  PONV Risk Score and Plan: 3 and Propofol infusion and Treatment may vary due to age or medical condition  Airway Management Planned: Nasal Cannula and Natural Airway  Additional Equipment: None  Intra-op Plan:   Post-operative Plan:   Informed Consent: I have reviewed the patients History and Physical, chart, labs and discussed the procedure including the risks, benefits and alternatives for the proposed anesthesia with the patient or authorized representative who has indicated his/her understanding and acceptance.     Dental advisory given  Plan Discussed with: CRNA and Anesthesiologist  Anesthesia Plan Comments:         Anesthesia Quick Evaluation

## 2021-05-29 NOTE — Progress Notes (Signed)
Advanced Heart Failure Rounding Note  PCP-Cardiologist: None   Subjective:    IV lasix started yesterday. Out over 3.1L. Weight down 3 pounds. Denies SOB, orthopnea or PND.   Serum calcium 10.4  Remains in AF on IV amio   Objective:   Weight Range: 130.9 kg Body mass index is 51.11 kg/m.   Vital Signs:   Temp:  [98.1 F (36.7 C)-98.6 F (37 C)] 98.2 F (36.8 C) (11/18 0739) Pulse Rate:  [71-96] 72 (11/18 0730) Resp:  [18-21] 21 (11/18 0730) BP: (109-175)/(59-96) 175/80 (11/18 0730) SpO2:  [100 %] 100 % (11/18 0730) Weight:  [130.9 kg] 130.9 kg (11/18 0432) Last BM Date: 05/29/21  Weight change: Filed Weights   05/27/21 0042 05/28/21 0429 05/29/21 0432  Weight: 129.9 kg 132 kg 130.9 kg    Intake/Output:   Intake/Output Summary (Last 24 hours) at 05/29/2021 0807 Last data filed at 05/29/2021 0600 Gross per 24 hour  Intake 1946.07 ml  Output 3150 ml  Net -1203.93 ml       Physical Exam    General:  Lying in bed No resp difficulty HEENT: normal Neck: supple. no JVD. Carotids 2+ bilat; no bruits. No lymphadenopathy or thryomegaly appreciated. Cor: PMI nondisplaced. Irregular rate & rhythm. No rubs, gallops or murmurs. Lungs: clear Abdomen: obese soft, nontender, nondistended. No hepatosplenomegaly. No bruits or masses. Good bowel sounds. Extremities: no cyanosis, clubbing, rash, tr edema Neuro: alert & orientedx3, cranial nerves grossly intact. moves all 4 extremities w/o difficulty. Affect pleasant    Telemetry   AFL 70-80s (personally reviewed)   Labs    CBC Recent Labs    05/28/21 0238 05/29/21 0248  WBC 6.8 6.7  HGB 10.9* 10.1*  HCT 34.2* 31.1*  MCV 94.5 92.0  PLT 414* 638    Basic Metabolic Panel Recent Labs    05/28/21 1230 05/29/21 0248  NA 132* 134*  K 3.2* 3.4*  CL 101 102  CO2 22 24  GLUCOSE 127* 108*  BUN 11 11  CREATININE 1.01* 1.05*  CALCIUM 11.2* 10.4*    Liver Function Tests Recent Labs    05/27/21 0210   AST 23  ALT 24  ALKPHOS 83  BILITOT 0.4  PROT 6.6  ALBUMIN 3.0*    No results for input(s): LIPASE, AMYLASE in the last 72 hours. Cardiac Enzymes No results for input(s): CKTOTAL, CKMB, CKMBINDEX, TROPONINI in the last 72 hours.  BNP: BNP (last 3 results) Recent Labs    05/03/21 2349  BNP 670.0*     ProBNP (last 3 results) No results for input(s): PROBNP in the last 8760 hours.   D-Dimer No results for input(s): DDIMER in the last 72 hours. Hemoglobin A1C No results for input(s): HGBA1C in the last 72 hours. Fasting Lipid Panel No results for input(s): CHOL, HDL, LDLCALC, TRIG, CHOLHDL, LDLDIRECT in the last 72 hours.  Thyroid Function Tests No results for input(s): TSH, T4TOTAL, T3FREE, THYROIDAB in the last 72 hours.  Invalid input(s): FREET3   Other results:   Imaging    US THYROID  Result Date: 05/28/2021 CLINICAL DATA:  Hypercalcemia EXAM: THYROID ULTRASOUND TECHNIQUE: Ultrasound examination of the thyroid gland and adjacent soft tissues was performed. COMPARISON:  None. FINDINGS: Parenchymal Echotexture: Mildly heterogenous Isthmus: 0.4 cm Right lobe: 4.6 x 1.6 x 1.5 cm Left lobe: 3.8 x 1.5 x 4 cm _________________________________________________________ Estimated total number of nodules >/= 1 cm: 0 Number of spongiform nodules >/=  2 cm not described below (TR1): 0 Number  of mixed cystic and solid nodules >/= 1.5 cm not described below (TR2): 0 _________________________________________________________ Subcentimeter hypoechoic nodule in the posterior aspect the right thyroid lobe does not meet criteria for further dedicated follow-up or biopsy. No other discrete nodules are seen within the thyroid gland. Posterior to the left thyroid lobe, there is a hypoechoic ovoid lesion the measures up to 2.1 x 0.8 x 1.0 cm. IMPRESSION: 1. Normal sonographic appearance of the thyroid gland. 2. Posterior to the left thyroid lobe, there is a hypoechoic ovoid lesion that  measures up to approximately 2 cm in size. Differential considerations include enlarged parathyroid gland versus lymph node. Electronically Signed   By: Albin Felling M.D.   On: 05/28/2021 12:26     Medications:     Scheduled Medications:  [MAR Hold] apixaban  5 mg Oral BID   [MAR Hold] cholecalciferol  400 Units Oral Daily   [MAR Hold] furosemide  80 mg Intravenous BID   [MAR Hold] hydrALAZINE  37.5 mg Oral Q8H   [MAR Hold] isosorbide mononitrate  60 mg Oral Daily   [MAR Hold] polyethylene glycol  17 g Oral BID   [MAR Hold] senna-docusate  1 tablet Oral BID    Infusions:  amiodarone 30 mg/hr (05/29/21 0019)    PRN Medications: [MAR Hold] acetaminophen **OR** [MAR Hold] acetaminophen, [MAR Hold] ondansetron **OR** [MAR Hold] ondansetron (ZOFRAN) IV, [MAR Hold] polyethylene glycol    Patient Profile   50 y.o. 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 Recently discharged from hospital 11/03 and readmitted on 11/14 with AKI and hypercalcemia  Assessment/Plan   1. Hypercalcemia: Ultimate cause uncertain.  No thiazide diuretic, no Ca supplement or vitamin D, no known malignancy and clear CXR.  Suspect there is an underlying predisposition to hypercalcemia with precipitation of marked hypercalcemia by aggressive diuresis/volume depletion/AKI.   - She has received pamidronate IV, Ca improved from 13.9 > 11.7 > 11.3 > 10.4  - management per TR[facility] - Less likely malignancy 1, 25 hydroxyvitamin D level low, PTHrp, myeloma panel, and urine immunofixation pending.   2. AKI: Creatinine 0.9 at baseline => 1.63 in setting of diuresis with acute systolic CHF and multiple medications.  Scr improved to 1.05 today - Holding Torsemide, spiro, losartan and digoxin. Can restart after DC-CV  3. Acute on Chronic systolic CHF: Nonischemic CMP, ?tachy-mediated.  Last study was TEE in 11/22 showing E 25-30%, normal RV, mild MR.   - Was  volume depleted on admit. Now volume up with IVF for HyperCa - Responding well to IV lasix. Continue one more day - Restart GDMT   4. Atrial fibrillation with RVR/now AFL with RVR: Failed DCCV last admission.  Has been on amiodarone.  - Rate controlled with amio gtt at 30/hr - Continue Eliquis.  Has not missed any doses of Eliquis at home - Now that she has loaded more amiodarone - Plan for repeat DC-CV today. Procedure discussed.   5. Hypokalemia.  - K 3.4 will supp  - add spiro  Length of Stay: 3  Glori Bickers, MD  05/29/2021, 8:07 AM  Advanced Heart Failure Team Pager [phone] (M-F; 7a - 5p)  Please contact [facility] for night-coverage after hours (5p -7a ) and weekends on [email or web address]

## 2021-05-29 NOTE — Interval H&P Note (Signed)
History and Physical Interval Note:  05/29/2021 7:49 AM  Christine Oneal  has presented today for surgery, with the diagnosis of atrial fibrillation.  The various methods of treatment have been discussed with the patient and family. After consideration of risks, benefits and other options for treatment, the patient has consented to  Procedure(s): CARDIOVERSION (N/A) as a surgical intervention.  The patient's history has been reviewed, patient examined, no change in status, stable for surgery.  I have reviewed the patient's chart and labs.  Questions were answered to the patient's satisfaction.     Chanie Soucek

## 2021-05-29 NOTE — Anesthesia Postprocedure Evaluation (Signed)
Anesthesia Post Note  Patient: Christine Oneal  Procedure(s) Performed: CARDIOVERSION     Patient location during evaluation: PACU Anesthesia Type: General Level of consciousness: awake and alert Pain management: pain level controlled Vital Signs Assessment: post-procedure vital signs reviewed and stable Respiratory status: spontaneous breathing, nonlabored ventilation, respiratory function stable and patient connected to nasal cannula oxygen Cardiovascular status: blood pressure returned to baseline and stable Postop Assessment: no apparent nausea or vomiting Anesthetic complications: no   No notable events documented.  Last Vitals:  Vitals:   05/29/21 0730 05/29/21 0739  BP: (!) 175/80   Pulse: 72   Resp: (!) 21   Temp:  36.8 C  SpO2: 100%     Last Pain:  Vitals:   05/29/21 0739  TempSrc: Other (Comment)  PainSc:                  Joslynne Klatt

## 2021-05-29 NOTE — Progress Notes (Signed)
Endo present to take pt to cardioversion. Pt transported in wheelchair with IV pump.

## 2021-05-29 NOTE — CV Procedure (Signed)
    DIRECT CURRENT CARDIOVERSION  NAME:  Christine Oneal   MRN: [id number] DOB:  1970-11-22   ADMIT DATE: 05/25/2021   INDICATIONS: Atrial flutter   PROCEDURE:   Informed consent was obtained prior to the procedure. The risks, benefits and alternatives for the procedure were discussed and the patient comprehended these risks. Once an appropriate time out was taken, the patient had the defibrillator pads placed in the anterior and posterior position. The patient then underwent sedation by the anesthesia service. Once an appropriate level of sedation was achieved, the patient received a single biphasic, synchronized 200J shock with prompt conversion to sinus rhythm. Patient then reverted to NSR briefly. She received further sedation and underwent repeat shock with several beats of NSR followed by return of AFL. No apparent complications.  Glori Bickers, MD  8:59 AM

## 2021-05-29 NOTE — Transfer of Care (Signed)
Immediate Anesthesia Transfer of Care Note  Patient: Christine Oneal  Procedure(s) Performed: CARDIOVERSION  Patient Location: PACU and Endoscopy Unit  Anesthesia Type:General  Level of Consciousness: awake and patient cooperative  Airway & Oxygen Therapy: Patient Spontanous Breathing  Post-op Assessment: Report given to RN and Post -op Vital signs reviewed and stable  Post vital signs: Reviewed and stable  Last Vitals:  Vitals Value Taken Time  BP 109/54   Temp    Pulse 84   Resp 22   SpO2 99     Last Pain:  Vitals:   05/29/21 0739  TempSrc: Other (Comment)  PainSc:          Complications: No notable events documented.

## 2021-05-30 LAB — MAGNESIUM: Magnesium: 2 mg/dL (ref 1.7–2.4)

## 2021-05-30 LAB — BASIC METABOLIC PANEL
Anion gap: 7 (ref 5–15)
BUN: 12 mg/dL (ref 6–20)
CO2: 25 mmol/L (ref 22–32)
Calcium: 10 mg/dL (ref 8.9–10.3)
Chloride: 102 mmol/L (ref 98–111)
Creatinine, Ser: 1.2 mg/dL — ABNORMAL HIGH (ref 0.44–1.00)
GFR, Estimated: 55 mL/min — ABNORMAL LOW (ref >60)
Glucose, Bld: 106 mg/dL — ABNORMAL HIGH (ref 70–99)
Potassium: 3.1 mmol/L — ABNORMAL LOW (ref 3.5–5.1)
Sodium: 134 mmol/L — ABNORMAL LOW (ref 135–145)

## 2021-05-30 LAB — CALCIUM, URINE, 24 HOUR
Calcium, 24 hour urine: 419 mg/24 hr — ABNORMAL HIGH (ref 0–320)
Calcium, Ur: 16.1 mg/dL
Total Volume: 2600

## 2021-05-30 MED ORDER — POTASSIUM CHLORIDE CRYS ER 20 MEQ PO TBCR
40.0000 meq | EXTENDED_RELEASE_TABLET | Freq: Once | ORAL | Status: AC
  Administered 2021-05-30: 40 meq via ORAL
  Filled 2021-05-30: qty 2

## 2021-05-30 MED ORDER — AMIODARONE HCL 200 MG PO TABS
200.0000 mg | ORAL_TABLET | Freq: Two times a day (BID) | ORAL | Status: DC
  Administered 2021-05-30 – 2021-05-31 (×2): 200 mg via ORAL
  Filled 2021-05-30 (×2): qty 1

## 2021-05-30 MED ORDER — SENNOSIDES-DOCUSATE SODIUM 8.6-50 MG PO TABS: 1.0000 | ORAL_TABLET | Freq: Every evening | ORAL | Status: DC | PRN

## 2021-05-30 MED ORDER — LOSARTAN POTASSIUM 50 MG PO TABS
50.0000 mg | ORAL_TABLET | Freq: Every day | ORAL | Status: DC
  Administered 2021-05-30 – 2021-05-31 (×2): 50 mg via ORAL
  Filled 2021-05-30 (×2): qty 1

## 2021-05-30 MED ORDER — TORSEMIDE 20 MG PO TABS
20.0000 mg | ORAL_TABLET | Freq: Every day | ORAL | Status: DC
  Administered 2021-05-30 – 2021-05-31 (×2): 20 mg via ORAL
  Filled 2021-05-30 (×2): qty 1

## 2021-05-30 MED ORDER — SPIRONOLACTONE 25 MG PO TABS
25.0000 mg | ORAL_TABLET | Freq: Every day | ORAL | Status: DC
  Administered 2021-05-30 – 2021-05-31 (×2): 25 mg via ORAL
  Filled 2021-05-30 (×2): qty 1

## 2021-05-30 NOTE — Progress Notes (Signed)
Progress Note    Christine Oneal  GUR:[id number] DOB: March 23, 1971  DOA: 05/25/2021 PCP: Maximiano Coss, NP    Brief Narrative:     Medical records reviewed and are as summarized below:  Christine Oneal is an 50 y.o. female  with medical history significant of HTN, recently diagnosed HFrEF with EF 20%, also A.Fib.  Pt just hospitalized earlier this month.  Started on numerous meds and diuretics, discharged. Pt went to PCP for follow up.  Having some constipation and palpitations (a.Fib), but no other new symptoms. At PCP labs showed hypercalcemia and AKI.  Pt sent in to ED.   Assessment/Plan:   Principal Problem:   Hypercalcemia Active Problems:   Essential hypertension   Unspecified atrial fibrillation (HCC)   Chronic systolic CHF (congestive heart failure) (HCC)   AKI (acute kidney injury) (HCC)   Hypercalcemia - -Pt not on thiazide diuretic at baseline to explain her hypercalcemia. -PTH elevated to 91, U/S of thyroid shows: ? Posterior to the left thyroid lobe, there is a hypoechoic ovoid lesion that measures up to approximately 2 cm in size. Differential considerations include enlarged parathyroid gland versus lymph node. 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 -Cont eliquis -s/p cardioversion but unfort went back into a fib with RVR  Chronic systolic CHF - -Cont imdur -Cont hydralazine -cards consult- resume diuretic  Obesity morbid Body mass index is 51.26 kg/m.  Hyponatremia -very mild    Family Communication/Anticipated D/C date and plan/Code Status   DVT prophylaxis: Lovenox ordered. Code Status: Full Code.  Disposition Plan: Status is: Inpatient  Remains inpatient appropriate because:home  in AM?         Medical Consultants:   cards    Subjective:  Having loose BMs now   Objective:    Vitals:   05/29/21 1148 05/29/21 2001 05/30/21 0417 05/30/21 1205  BP: 126/66 103/66 133/70 (!) 122/98  Pulse: 73 77 71 94  Resp: 18 20 20 20   Temp: 98.1 F (36.7 C) 98.8 F (37.1 C) 98.6 F (37 C) 98.9 F (37.2 C)  TempSrc: Oral Oral Oral Oral  SpO2: 100% 98% 97% 100%  Weight:   131.3 kg   Height:        Intake/Output Summary (Last 24 hours) at 05/30/2021 1545 Last data filed at 05/30/2021 1426 Gross per 24 hour  Intake 1454.96 ml  Output 1650 ml  Net -195.04 ml   Filed Weights   05/28/21 0429 05/29/21 0432 05/30/21 0417  Weight: 132 kg 130.9 kg 131.3 kg    Exam:  General: Appearance:    Severely obese female in no acute distress     Lungs:     respirations unlabored  Heart:    Normal heart rate. .   MS:   All extremities are intact.    Neurologic:   Awake, alert, oriented x 3. No apparent focal neurological           defect.        Data Reviewed:   I have personally reviewed following labs and imaging studies:  Labs: Labs show the following:   Basic Metabolic Panel: Recent Labs  Lab 05/26/21 0457 05/26/21 1055 05/27/21 0210 05/28/21 0238 05/28/21 1230 05/29/21 0248 05/30/21 0226  NA  --    < >  131* 132* 132* 134* 134*  K  --    < > 3.9 4.0 3.2* 3.4* 3.1*  CL  --    < > 102 103 101 102 102  CO2  --    < > 23 22 22 24 25   GLUCOSE  --    < > 121* 98 127* 108* 106*  BUN  --    < > 22* 14 11 11 12   CREATININE  --    < > 1.14* 1.14* 1.01* 1.05* 1.20*  CALCIUM  --    < > 11.7*  11.8* 11.3* 11.2* 10.4* 10.0  MG 2.7*  --   --   --   --   --  2.0  PHOS 2.1*  --   --   --   --   --   --    < > = values in this interval not displayed.   GFR Estimated Creatinine Clearance: 73.5 mL/min (A) (by C-G formula based on SCr of 1.2 mg/dL (H)). Liver Function Tests: Recent Labs  Lab 05/25/21 1414 05/25/21 2100 05/27/21 0210  AST 19 21 23   ALT  22 25 24   ALKPHOS 105 89 83  BILITOT 1.0 0.7 0.4  PROT 8.1 8.2* 6.6  ALBUMIN 4.3 3.9 3.0*   No results for input(s): LIPASE, AMYLASE in the last 168 hours. No results for input(s): AMMONIA in the last 168 hours. Coagulation profile No results for input(s): INR, PROTIME in the last 168 hours.  CBC: Recent Labs  Lab 05/25/21 2100 05/28/21 0238 05/29/21 0248  WBC 8.4 6.8 6.7  NEUTROABS 5.5  --   --   HGB 12.3 10.9* 10.1*  HCT 37.8 34.2* 31.1*  MCV 94.5 94.5 92.0  PLT 460* 414* 390   Cardiac Enzymes: No results for input(s): CKTOTAL, CKMB, CKMBINDEX, TROPONINI in the last 168 hours. BNP (last 3 results) No results for input(s): PROBNP in the last 8760 hours. CBG: No results for input(s): GLUCAP in the last 168 hours. D-Dimer: No results for input(s): DDIMER in the last 72 hours. Hgb A1c: No results for input(s): HGBA1C in the last 72 hours. Lipid Profile: No results for input(s): CHOL, HDL, LDLCALC, TRIG, CHOLHDL, LDLDIRECT in the last 72 hours.  Thyroid function studies: No results for input(s): TSH, T4TOTAL, T3FREE, THYROIDAB in the last 72 hours.  Invalid input(s): FREET3  Anemia work up: No results for input(s): VITAMINB12, FOLATE, FERRITIN, TIBC, IRON, RETICCTPCT in the last 72 hours. Sepsis Labs: Recent Labs  Lab 05/25/21 2100 05/28/21 0238 05/29/21 0248  WBC 8.4 6.8 6.7    Microbiology Recent Results (from the past 240 hour(s))  Resp Panel by RT-PCR (Flu A&B, Covid) Nasopharyngeal Swab     Status: None   Collection Time: 05/26/21  3:24 AM   Specimen: Nasopharyngeal Swab; Nasopharyngeal(NP) swabs in vial transport medium  Result Value Ref Range Status   SARS Coronavirus 2 by RT PCR NEGATIVE NEGATIVE Final    Comment: (NOTE) SARS-CoV-2 target nucleic acids are NOT DETECTED.  The SARS-CoV-2 RNA is generally detectable in upper respiratory specimens during the acute phase of infection. The lowest concentration of SARS-CoV-2 viral copies this assay can  detect is 138 copies/mL. A negative result does not preclude SARS-Cov-2 infection and should not be used as the sole basis for treatment or other patient management decisions. A negative result may occur with  improper specimen collection/handling, submission of specimen other than nasopharyngeal swab, presence of viral mutation(s) within the areas targeted by this assay, and  inadequate number of viral copies(<138 copies/mL). A negative result must be combined with clinical observations, patient history, and epidemiological information. The expected result is Negative.  Fact Sheet for Patients:  EntrepreneurPulse.com.au  Fact Sheet for Healthcare Providers:  IncredibleEmployment.be  This test is no t yet approved or cleared by the [place] FDA and  has been authorized for detection and/or diagnosis of SARS-CoV-2 by FDA under an Emergency Use Authorization (EUA). This EUA will remain  in effect (meaning this test can be used) for the duration of the COVID-19 declaration under Section 564(b)(1) of the Act, 21 U.S.C.section 360bbb-3(b)(1), unless the authorization is terminated  or revoked sooner.       Influenza A by PCR NEGATIVE NEGATIVE Final   Influenza B by PCR NEGATIVE NEGATIVE Final    Comment: (NOTE) The Xpert Xpress SARS-CoV-2/FLU/RSV plus assay is intended as an aid in the diagnosis of influenza from Nasopharyngeal swab specimens and should not be used as a sole basis for treatment. Nasal washings and aspirates are unacceptable for Xpert Xpress SARS-CoV-2/FLU/RSV testing.  Fact Sheet for Patients: EntrepreneurPulse.com.au  Fact Sheet for Healthcare Providers: IncredibleEmployment.be  This test is not yet approved or cleared by the [place] FDA and has been authorized for detection and/or diagnosis of SARS-CoV-2 by FDA under an Emergency Use Authorization (EUA). This EUA will remain in  effect (meaning this test can be used) for the duration of the COVID-19 declaration under Section 564(b)(1) of the Act, 21 U.S.C. section 360bbb-3(b)(1), unless the authorization is terminated or revoked.  Performed at [facility], [place] [address], [place], [place] [address]     Procedures and diagnostic studies:  No results found.  Medications:    amiodarone  200 mg Oral BID   apixaban  5 mg Oral BID   cholecalciferol  400 Units Oral Daily   hydrALAZINE  37.5 mg Oral Q8H   isosorbide mononitrate  60 mg Oral Daily   losartan  50 mg Oral Daily   polyethylene glycol  17 g Oral BID   potassium chloride  40 mEq Oral Once   senna-docusate  1 tablet Oral BID   spironolactone  12.5 mg Oral Daily   spironolactone  25 mg Oral Daily   torsemide  20 mg Oral Daily   Continuous Infusions:     LOS: 4 days   Geradine Girt  [facility]   How to contact the [facility] Attending or Consulting provider [place] or covering provider during after hours [place], for this patient?  Check the care team in [facility] and look for a) attending/consulting [facility] provider listed and b) the [facility] team listed Log into [email or web address] and use [place]'s universal password to access. If you do not have the password, please contact the hospital operator. Locate the [facility] provider you are looking for under [facility] and page to a number that you can be directly reached. If you still have difficulty reaching the provider, please page the [facility] (Director on Call) for the Hospitalists listed on amion for assistance.  05/30/2021, 3:45 PM

## 2021-05-30 NOTE — Progress Notes (Addendum)
Advanced Heart Failure Rounding Note  PCP-Cardiologist: None   Subjective:    Underwent attempted DC-CV yesterday. Brief return to NSR then back to AF. Rates in 54s on IV amio.   Remains on IV lasix. Weight unchanged.   Denies CP, SOB, orthopnea or PND. SCr up slightly to 1.2   Objective:   Weight Range: 131.3 kg Body mass index is 51.26 kg/m.   Vital Signs:   Temp:  [98.1 F (36.7 C)-98.8 F (37.1 C)] 98.6 F (37 C) (11/19 0417) Pulse Rate:  [71-77] 71 (11/19 0417) Resp:  [18-20] 20 (11/19 0417) BP: (103-133)/(66-70) 133/70 (11/19 0417) SpO2:  [97 %-100 %] 97 % (11/19 0417) Weight:  [131.3 kg] 131.3 kg (11/19 0417) Last BM Date: 05/29/21  Weight change: Filed Weights   05/28/21 0429 05/29/21 0432 05/30/21 0417  Weight: 132 kg 130.9 kg 131.3 kg    Intake/Output:   Intake/Output Summary (Last 24 hours) at 05/30/2021 1143 Last data filed at 05/30/2021 0925 Gross per 24 hour  Intake 1714.18 ml  Output 2200 ml  Net -485.82 ml       Physical Exam    General:  Sitting in bed  No resp difficulty HEENT: normal Neck: supple. Hard to see JVP  Carotids 2+ bilat; no bruits. No lymphadenopathy or thryomegaly appreciated. Cor: PMI nondisplaced. Irregular  No rubs, gallops or murmurs. Lungs: clear Abdomen:obese soft, nontender, nondistended. No hepatosplenomegaly. No bruits or masses. Good bowel sounds. Extremities: no cyanosis, clubbing, rash, edema Neuro: alert & orientedx3, cranial nerves grossly intact. moves all 4 extremities w/o difficulty. Affect pleasant    Telemetry   AFL 70-80s (personally reviewed)   Labs    CBC Recent Labs    05/28/21 0238 05/29/21 0248  WBC 6.8 6.7  HGB 10.9* 10.1*  HCT 34.2* 31.1*  MCV 94.5 92.0  PLT 414* 062    Basic Metabolic Panel Recent Labs    05/29/21 0248 05/30/21 0226  NA 134* 134*  K 3.4* 3.1*  CL 102 102  CO2 24 25  GLUCOSE 108* 106*  BUN 11 12  CREATININE 1.05* 1.20*  CALCIUM 10.4* 10.0  MG   --  2.0    Liver Function Tests No results for input(s): AST, ALT, ALKPHOS, BILITOT, PROT, ALBUMIN in the last 72 hours.  No results for input(s): LIPASE, AMYLASE in the last 72 hours. Cardiac Enzymes No results for input(s): CKTOTAL, CKMB, CKMBINDEX, TROPONINI in the last 72 hours.  BNP: BNP (last 3 results) Recent Labs    05/03/21 2349  BNP 670.0*     ProBNP (last 3 results) No results for input(s): PROBNP in the last 8760 hours.   D-Dimer No results for input(s): DDIMER in the last 72 hours. Hemoglobin A1C No results for input(s): HGBA1C in the last 72 hours. Fasting Lipid Panel No results for input(s): CHOL, HDL, LDLCALC, TRIG, CHOLHDL, LDLDIRECT in the last 72 hours.  Thyroid Function Tests No results for input(s): TSH, T4TOTAL, T3FREE, THYROIDAB in the last 72 hours.  Invalid input(s): FREET3   Other results:   Imaging    No results found.   Medications:     Scheduled Medications:  apixaban  5 mg Oral BID   cholecalciferol  400 Units Oral Daily   furosemide  80 mg Intravenous BID   hydrALAZINE  37.5 mg Oral Q8H   isosorbide mononitrate  60 mg Oral Daily   polyethylene glycol  17 g Oral BID   potassium chloride  40 mEq Oral Once  senna-docusate  1 tablet Oral BID   spironolactone  12.5 mg Oral Daily    Infusions:  amiodarone 30 mg/hr (05/30/21 0708)    PRN Medications: acetaminophen **OR** acetaminophen, ondansetron **OR** ondansetron (ZOFRAN) IV, polyethylene glycol    Patient Profile   50 y.o. 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 Recently discharged from hospital 11/03 and readmitted on 11/14 with AKI and hypercalcemia  Assessment/Plan   1. Hypercalcemia: Ultimate cause uncertain.  No thiazide diuretic, no Ca supplement or vitamin D, no known malignancy and clear CXR.  Suspect there is an underlying predisposition to hypercalcemia with precipitation of marked  hypercalcemia by aggressive diuresis/volume depletion/AKI.   - She has received pamidronate IV, Ca improved from 13.9 >>> 10.0  - management per [facility] - Less likely malignancy 1, 25 hydroxyvitamin D level low, PTHrp, myeloma panel, and urine immunofixation pending.   2. AKI: Creatinine 0.9 at baseline => 1.63 in setting of diuresis with acute systolic CHF and multiple medications.  Scr 1.05 -> 1.20  today - Holding Torsemide, spiro, losartan and digoxin.  - Resume spiro and losartan - Switch IV lasix back to torsemide  3. Acute on Chronic systolic CHF: Nonischemic CMP, ?tachy-mediated.  Last study was TEE in 11/22 showing E 25-30%, normal RV, mild MR.   - Was volume depleted on admit. Now volume up with IVF for HyperCa - Volume status looks ok. Stop IV lasix  - Restart GDMT  as above  4. Atrial fibrillation with RVR/now AFL with RVR: Failed DCCV last admission.  Has been on amiodarone.  - Underwent attempted DC-CV yesterday. Brief return to NSR then back to AF. Rates in 17s on IV amio. Will switch to po  - Continue Eliquis.    5. Hypokalemia.  - K 3.1 will supp  - Continue spiro  Stable for d/c home tomorrow from our standpoint.   Length of Stay: [place], MD  05/30/2021, 11:43 AM  Advanced Heart Failure Team Pager [phone] (M-F; 7a - 5p)  Please contact [facility] for night-coverage after hours (5p -7a ) and weekends on [email or web address]

## 2021-05-31 LAB — BASIC METABOLIC PANEL
Anion gap: 7 (ref 5–15)
BUN: 13 mg/dL (ref 6–20)
CO2: 25 mmol/L (ref 22–32)
Calcium: 9.8 mg/dL (ref 8.9–10.3)
Chloride: 102 mmol/L (ref 98–111)
Creatinine, Ser: 1.2 mg/dL — ABNORMAL HIGH (ref 0.44–1.00)
GFR, Estimated: 55 mL/min — ABNORMAL LOW (ref >60)
Glucose, Bld: 101 mg/dL — ABNORMAL HIGH (ref 70–99)
Potassium: 3.7 mmol/L (ref 3.5–5.1)
Sodium: 134 mmol/L — ABNORMAL LOW (ref 135–145)

## 2021-05-31 MED ORDER — ISOSORBIDE MONONITRATE ER 30 MG PO TB24: 30.0000 mg | ORAL_TABLET | Freq: Every day | ORAL | 0 refills | Status: DC

## 2021-05-31 MED ORDER — LOSARTAN POTASSIUM 50 MG PO TABS: 50.0000 mg | ORAL_TABLET | Freq: Every day | ORAL | 0 refills | Status: DC

## 2021-05-31 MED ORDER — HYDRALAZINE HCL 25 MG PO TABS: 25.0000 mg | ORAL_TABLET | Freq: Three times a day (TID) | ORAL | 0 refills | Status: DC

## 2021-05-31 MED ORDER — POTASSIUM CHLORIDE CRYS ER 20 MEQ PO TBCR: 20.0000 meq | EXTENDED_RELEASE_TABLET | ORAL | 0 refills | Status: DC

## 2021-05-31 MED ORDER — CHOLECALCIFEROL 10 MCG (400 UNIT) PO TABS: 400.0000 [IU] | ORAL_TABLET | Freq: Every day | ORAL | 0 refills | Status: DC

## 2021-05-31 MED ORDER — TORSEMIDE 20 MG PO TABS: 20.0000 mg | ORAL_TABLET | ORAL | 0 refills | Status: DC

## 2021-05-31 NOTE — Discharge Summary (Signed)
Physician Discharge Summary  SAUL DORSI EHM:[id number] DOB: 04/26/1971 DOA: 05/25/2021  PCP: Maximiano Coss, NP  Admit date: 05/25/2021 Discharge date: 05/31/2021  Admitted From: home Discharge disposition: home   Recommendations for Outpatient Follow-Up:   Referral to endocrine to follow up on hypercalcermia work up BMP 1 week Referral to a fib clinic   Discharge Diagnosis:   Principal Problem:   Hypercalcemia Active Problems:   Essential hypertension   Unspecified atrial fibrillation (HCC)   Chronic systolic CHF (congestive heart failure) ([place])   AKI (acute kidney injury) ([place])    Discharge Condition: Improved.  Diet recommendation: Low sodium, heart healthy  Wound care: None.  Code status: Full.   History of Present Illness:   Christine Oneal is a 50 y.o. female with medical history significant of HTN, recently diagnosed HFrEF with EF 20%, also A.Fib.  Pt just hospitalized earlier this month.  Started on numerous meds and diuretics, discharged.   Pt went to PCP for follow up.  Having some constipation and palpitations (a.Fib), but no other new symptoms.   At PCP labs showed hypercalcemia and AKI.  Pt sent in to ED.   No CP, SOB, decreased UOP, abd pain, lethargy, AMS.   Hospital Course by Problem:   Hypercalcemia - -Pt not on thiazide diuretic at baseline to explain her hypercalcemia. -PTH elevated to 91, U/S of thyroid shows: ? Posterior to the left thyroid lobe, there is a hypoechoic ovoid lesion that measures up to approximately 2 cm in size. Differential considerations include enlarged parathyroid gland versus lymph node. 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  Scr 1.05 -> stable @  1.20 -seem med changes   Acute on Chronic systolic CHF: Nonischemic CMP, ?tachy-mediated.  Last study was TEE in 11/22 showing E 25-30%, normal RV, mild MR.   -meds per cards: Amiodarone 200 bid Eliquis 5 bid Arlyce Harman 25 daily Losartan 50 daily (decreased from 100 daily) Hydralazine 25 tid (decreased form 37.5 tid) Imdur 30 daily (decreased from 60 daily)  Farxiga 10 daily Torsemide 20mg  MWF (decreased from daily) Kdur 13meq MWF with torsemide   Atrial fibrillation with RVR/now AFL with RVR: Failed DCCV last admission.  Has been on amiodarone.  - Underwent attempted DC-CV 11/18 Brief return to NSR then back to AF. Now back in NSR - Continue po amio - Continue Eliquis.   - cards to refer to AF Clinic on d/c to discuss possible ablation. Given young age would likel to avoid long-term exposure to [facility], if possible   Hypokalemia.  -repleted - Continue spiro    Medical Consultants:   cards   Discharge Exam:   Vitals:   05/31/21 0453 05/31/21 1122  BP: (!) 101/55 134/90  Pulse: (!) 55 93  Resp: 18 20  Temp: 98.2 F (36.8 C) 98.7 F (37.1 C)  SpO2: 97% 100%   Vitals:   05/30/21 1205 05/30/21 2007 05/31/21 0453 05/31/21 1122  BP: (!) 122/98 (!) 111/51 (!) 101/55 134/90  Pulse: 94 92 (!) 55 93  Resp: 20 18 18 20   Temp: 98.9 F (37.2 C) 98.4 F (36.9 C) 98.2 F (36.8 C) 98.7 F (37.1 C)  TempSrc: Oral Oral Oral Oral  SpO2: 100% 99% 97% 100%  Weight:  130.8 kg   Height:        General exam: Appears calm and comfortable.   The results of significant diagnostics from this hospitalization (including imaging, microbiology, ancillary and laboratory) are listed below for reference.     Procedures and Diagnostic Studies:   DG CHEST PORT 1 VIEW  Result Date: 05/26/2021 CLINICAL DATA:  Hypercalcemia. EXAM: PORTABLE CHEST 1 VIEW COMPARISON:  05/05/2021 FINDINGS: 0504 hours. The lungs are clear without focal pneumonia, edema, pneumothorax or pleural effusion.  Cardiopericardial silhouette is at upper limits of normal for size. The visualized bony structures of the thorax show no acute abnormality. Telemetry leads overlie the chest. IMPRESSION: No active disease. Electronically Signed   By: Misty Stanley M.D.   On: 05/26/2021 05:46     Labs:   Basic Metabolic Panel: Recent Labs  Lab 05/26/21 0457 05/26/21 1055 05/28/21 0238 05/28/21 1230 05/29/21 0248 05/30/21 0226 05/31/21 0230  NA  --    < > 132* 132* 134* 134* 134*  K  --    < > 4.0 3.2* 3.4* 3.1* 3.7  CL  --    < > 103 101 102 102 102  CO2  --    < > 22 22 24 25 25   GLUCOSE  --    < > 98 127* 108* 106* 101*  BUN  --    < > 14 11 11 12 13   CREATININE  --    < > 1.14* 1.01* 1.05* 1.20* 1.20*  CALCIUM  --    < > 11.3* 11.2* 10.4* 10.0 9.8  MG 2.7*  --   --   --   --  2.0  --   PHOS 2.1*  --   --   --   --   --   --    < > = values in this interval not displayed.   GFR Estimated Creatinine Clearance: 73.4 mL/min (A) (by C-G formula based on SCr of 1.2 mg/dL (H)). Liver Function Tests: Recent Labs  Lab 05/25/21 2100 05/27/21 0210  AST 21 23  ALT 25 24  ALKPHOS 89 83  BILITOT 0.7 0.4  PROT 8.2* 6.6  ALBUMIN 3.9 3.0*   No results for input(s): LIPASE, AMYLASE in the last 168 hours. No results for input(s): AMMONIA in the last 168 hours. Coagulation profile No results for input(s): INR, PROTIME in the last 168 hours.  CBC: Recent Labs  Lab 05/25/21 2100 05/28/21 0238 05/29/21 0248  WBC 8.4 6.8 6.7  NEUTROABS 5.5  --   --   HGB 12.3 10.9* 10.1*  HCT 37.8 34.2* 31.1*  MCV 94.5 94.5 92.0  PLT 460* 414* 390   Cardiac Enzymes: No results for input(s): CKTOTAL, CKMB, CKMBINDEX, TROPONINI in the last 168 hours. BNP: Invalid input(s): POCBNP CBG: No results for input(s): GLUCAP in the last 168 hours. D-Dimer No results for input(s): DDIMER in the last 72 hours. Hgb A1c No results for input(s): HGBA1C in the last 72 hours. Lipid Profile No results for input(s):  CHOL, HDL, LDLCALC, TRIG, CHOLHDL, LDLDIRECT in the last 72 hours. Thyroid function studies No results for input(s): TSH, T4TOTAL, T3FREE, THYROIDAB in the last 72 hours.  Invalid input(s): FREET3 Anemia work up No results for input(s): VITAMINB12, FOLATE, FERRITIN, TIBC, IRON, RETICCTPCT in the last 72 hours. Microbiology Recent Results (from the past 240 hour(s))  Resp Panel by RT-PCR (Flu A&B, Covid) Nasopharyngeal Swab     Status: None   Collection Time: 05/26/21  3:24 AM  Specimen: Nasopharyngeal Swab; Nasopharyngeal(NP) swabs in vial transport medium  Result Value Ref Range Status   SARS Coronavirus 2 by RT PCR NEGATIVE NEGATIVE Final    Comment: (NOTE) SARS-CoV-2 target nucleic acids are NOT DETECTED.  The SARS-CoV-2 RNA is generally detectable in upper respiratory specimens during the acute phase of infection. The lowest concentration of SARS-CoV-2 viral copies this assay can detect is 138 copies/mL. A negative result does not preclude SARS-Cov-2 infection and should not be used as the sole basis for treatment or other patient management decisions. A negative result may occur with  improper specimen collection/handling, submission of specimen other than nasopharyngeal swab, presence of viral mutation(s) within the areas targeted by this assay, and inadequate number of viral copies(<138 copies/mL). A negative result must be combined with clinical observations, patient history, and epidemiological information. The expected result is Negative.  Fact Sheet for Patients:  EntrepreneurPulse.com.au  Fact Sheet for Healthcare Providers:  IncredibleEmployment.be  This test is no t yet approved or cleared by the [place] FDA and  has been authorized for detection and/or diagnosis of SARS-CoV-2 by FDA under an Emergency Use Authorization (EUA). This EUA will remain  in effect (meaning this test can be used) for the duration of the COVID-19  declaration under Section 564(b)(1) of the Act, 21 U.S.C.section 360bbb-3(b)(1), unless the authorization is terminated  or revoked sooner.       Influenza A by PCR NEGATIVE NEGATIVE Final   Influenza B by PCR NEGATIVE NEGATIVE Final    Comment: (NOTE) The Xpert Xpress SARS-CoV-2/FLU/RSV plus assay is intended as an aid in the diagnosis of influenza from Nasopharyngeal swab specimens and should not be used as a sole basis for treatment. Nasal washings and aspirates are unacceptable for Xpert Xpress SARS-CoV-2/FLU/RSV testing.  Fact Sheet for Patients: EntrepreneurPulse.com.au  Fact Sheet for Healthcare Providers: IncredibleEmployment.be  This test is not yet approved or cleared by the [place] FDA and has been authorized for detection and/or diagnosis of SARS-CoV-2 by FDA under an Emergency Use Authorization (EUA). This EUA will remain in effect (meaning this test can be used) for the duration of the COVID-19 declaration under Section 564(b)(1) of the Act, 21 U.S.C. section 360bbb-3(b)(1), unless the authorization is terminated or revoked.  Performed at [facility], [place] [address] [place], [place] [address]      Discharge Instructions:   Discharge Instructions     (HEART FAILURE PATIENTS) Call MD:  Anytime you have any of the following symptoms: 1) 3 pound weight gain in 24 hours or 5 pounds in 1 week 2) shortness of breath, with or without a dry hacking cough 3) swelling in the hands, feet or stomach 4) if you have to sleep on extra pillows at night in order to breathe.   Complete by: As directed    Ambulatory referral to Endocrinology   Complete by: As directed    Diet - low sodium heart healthy   Complete by: As directed    Heart Failure patients record your daily weight using the same scale at the same time of day   Complete by: As directed    Increase activity slowly   Complete by: As directed       Allergies as  of 05/31/2021       Reactions   Lisinopril-hydrochlorothiazide Anaphylaxis   Other Anaphylaxis        Medication List     STOP taking these medications    digoxin 0.125 MG tablet Commonly known  as: LANOXIN       TAKE these medications    acetaminophen 500 MG tablet Commonly known as: TYLENOL Take 1,000 mg by mouth every 6 (six) hours as needed for moderate pain or headache.   amiodarone 200 MG tablet Commonly known as: PACERONE Take 1 tablet (200 mg total) by mouth 2 (two) times daily.   cholecalciferol 10 MCG (400 UNIT) Tabs tablet Commonly known as: VITAMIN D3 Take 1 tablet (400 Units total) by mouth daily.   Eliquis 5 MG Tabs tablet Generic drug: apixaban Take 1 tablet (5 mg total) by mouth 2 (two) times daily.   Farxiga 10 MG Tabs tablet Generic drug: dapagliflozin propanediol Take 1 tablet (10 mg total) by mouth daily.   hydrALAZINE 25 MG tablet Commonly known as: APRESOLINE Take 1 tablet (25 mg total) by mouth 3 (three) times daily. What changed:  how much to take when to take this   isosorbide mononitrate 30 MG 24 hr tablet Commonly known as: IMDUR Take 1 tablet (30 mg total) by mouth daily. What changed:  medication strength how much to take   losartan 50 MG tablet Commonly known as: COZAAR Take 1 tablet (50 mg total) by mouth daily. What changed:  medication strength how much to take   potassium chloride SA 20 MEQ tablet Commonly known as: KLOR-CON Take 1 tablet (20 mEq total) by mouth every Monday, Wednesday, and Friday. What changed:  how much to take when to take this   Probiotic Chew Chew 2 capsules by mouth daily.   spironolactone 25 MG tablet Commonly known as: ALDACTONE Take 1 tablet (25 mg total) by mouth daily.   torsemide 20 MG tablet Commonly known as: Demadex Take 1 tablet (20 mg total) by mouth every Monday, Wednesday, and Friday. What changed: when to take this        [place] Follow up.   Why: Home Health RN, Physical Therapy and aide-will call to arrange visit Contact information: [phone]                 Time coordinating discharge: 35 min  Signed:  Geradine Girt DO  [facility] 06/01/2021, 2:29 PM

## 2021-05-31 NOTE — TOC Transition Note (Signed)
Transition of Care [facility]) - CM/SW Discharge Note   Patient Details  Name: Christine Oneal MRN: [id number] Date of Birth: Mar 02, 1971  Transition of Care [facility]) CM/SW Contact:  Maebelle Munroe, RN Phone Number: 05/31/2021, 3:15 PM   Clinical Narrative:  [facility] team for discharge planning. Spoke to patient at bedside. She is alert, pleasant and her own historian. She voices that she will return to daughter's home where she resides. She shares daughter will transport her post discharge. Pt shares she has had HHPT, RN and HHA prior to coming in the hospital. Confirmed that services will be resumed post discharged. Pt agrees with the current plan of call. No further needs identified.    Final next level of care: [place] Barriers to Discharge: No Barriers Identified   Patient Goals and CMS Choice Patient states their goals for this hospitalization and ongoing recovery are:: Returning home with daughter CMS Medicare.gov Compare Post Acute Care list provided to:: Patient Choice offered to / list presented to : Patient  Discharge Placement                       Discharge Plan and Services In-house Referral: Clinical Social Work Discharge Planning Services: CM Consult Post Acute Care Choice: [place]: RN, PT ([place]) [facility] Agency: [place] ([place]) Date [place]: 05/31/21 Time [place]: 1500 Representative spoke with at [place]: Floydene [phone]  Social Determinants of Health (SDOH) Interventions Food Insecurity Interventions: Intervention Not Indicated Transportation Interventions: Intervention Not Indicated   Readmission Risk Interventions No flowsheet data found.

## 2021-05-31 NOTE — Progress Notes (Addendum)
Advanced Heart Failure Rounding Note  PCP-Cardiologist: None   Subjective:    Underwent attempted DC-CV 11/18. Brief return to NSR then back to AF  Converted back to NSR this am   IV lasix stopped yesterday.   Feels fine. Denies SOB, palpitations, orthopnea or PND. Weight stable,   Objective:   Weight Range: 130.8 kg Body mass index is 51.09 kg/m.   Vital Signs:   Temp:  [98.2 F (36.8 C)-98.9 F (37.2 C)] 98.2 F (36.8 C) (11/20 0453) Pulse Rate:  [55-94] 55 (11/20 0453) Resp:  [18-20] 18 (11/20 0453) BP: (101-122)/(51-98) 101/55 (11/20 0453) SpO2:  [97 %-100 %] 97 % (11/20 0453) Weight:  [130.8 kg] 130.8 kg (11/20 0453) Last BM Date: 05/30/21  Weight change: Filed Weights   05/29/21 0432 05/30/21 0417 05/31/21 0453  Weight: 130.9 kg 131.3 kg 130.8 kg    Intake/Output:   Intake/Output Summary (Last 24 hours) at 05/31/2021 0955 Last data filed at 05/31/2021 0801 Gross per 24 hour  Intake 1365.41 ml  Output 1300 ml  Net 65.41 ml       Physical Exam    General:  Sitting up in bed No resp difficulty HEENT: normal Neck: supple. JVP hard to see Carotids 2+ bilat; no bruits. No lymphadenopathy or thryomegaly appreciated. Cor: PMI nondisplaced. Regular rate & rhythm. No rubs, gallops or murmurs. Lungs: clear Abdomen: obese soft, nontender, nondistended. No hepatosplenomegaly. No bruits or masses. Good bowel sounds. Extremities: no cyanosis, clubbing, rash, edema Neuro: alert & orientedx3, cranial nerves grossly intact. moves all 4 extremities w/o difficulty. Affect pleasant  Telemetry   Sinus 90-105(personally reviewed)   Labs    CBC Recent Labs    05/29/21 0248  WBC 6.7  HGB 10.1*  HCT 31.1*  MCV 92.0  PLT 740    Basic Metabolic Panel Recent Labs    05/30/21 0226 05/31/21 0230  NA 134* 134*  K 3.1* 3.7  CL 102 102  CO2 25 25  GLUCOSE 106* 101*  BUN 12 13  CREATININE 1.20* 1.20*  CALCIUM 10.0 9.8  MG 2.0  --     Liver  Function Tests No results for input(s): AST, ALT, ALKPHOS, BILITOT, PROT, ALBUMIN in the last 72 hours.  No results for input(s): LIPASE, AMYLASE in the last 72 hours. Cardiac Enzymes No results for input(s): CKTOTAL, CKMB, CKMBINDEX, TROPONINI in the last 72 hours.  BNP: BNP (last 3 results) Recent Labs    05/03/21 2349  BNP 670.0*     ProBNP (last 3 results) No results for input(s): PROBNP in the last 8760 hours.   D-Dimer No results for input(s): DDIMER in the last 72 hours. Hemoglobin A1C No results for input(s): HGBA1C in the last 72 hours. Fasting Lipid Panel No results for input(s): CHOL, HDL, LDLCALC, TRIG, CHOLHDL, LDLDIRECT in the last 72 hours.  Thyroid Function Tests No results for input(s): TSH, T4TOTAL, T3FREE, THYROIDAB in the last 72 hours.  Invalid input(s): FREET3   Other results:   Imaging    No results found.   Medications:     Scheduled Medications:  amiodarone  200 mg Oral BID   apixaban  5 mg Oral BID   cholecalciferol  400 Units Oral Daily   hydrALAZINE  37.5 mg Oral Q8H   isosorbide mononitrate  60 mg Oral Daily   losartan  50 mg Oral Daily   spironolactone  25 mg Oral Daily   torsemide  20 mg Oral Daily    Infusions:  PRN Medications: acetaminophen **OR** acetaminophen, ondansetron **OR** ondansetron (ZOFRAN) IV, polyethylene glycol, senna-docusate    Patient Profile   50 y.o. 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 Recently discharged from hospital 11/03 and readmitted on 11/14 with AKI and hypercalcemia  Assessment/Plan   1. Hypercalcemia: Ultimate cause uncertain.  No thiazide diuretic, no Ca supplement or vitamin D, no known malignancy and clear CXR.  Suspect there is an underlying predisposition to hypercalcemia with precipitation of marked hypercalcemia by aggressive diuresis/volume depletion/AKI.   - She has received pamidronate IV, Ca improved  from 13.9 >>> 9.8 - management per TRH - Less likely malignancy 1, 25 hydroxyvitamin D level low, PTHrp, myeloma panel, and urine immunofixation pending.   2. AKI: Creatinine 0.9 at baseline => 1.63 in setting of diuresis with acute systolic CHF and multiple medications.  Scr 1.05 -> stable @ 1.20  today - Holding Torsemide, spiro, losartan and digoxin.  Arlyce Harman and losartan resumed yesterday.  - Will add back torsemide. Will not resume digoxin    3. Acute on Chronic systolic CHF: Nonischemic CMP, ?tachy-mediated.  Last study was TEE in 11/22 showing E 25-30%, normal RV, mild MR.   - Was volume depleted on admit - Volume status looks good today - Restarting GDMT  as above  4. Atrial fibrillation with RVR/now AFL with RVR: Failed DCCV last admission.  Has been on amiodarone.  - Underwent attempted DC-CV 11/18 Brief return to NSR then back to AF. Now back in NSR this am  - Continue po amio - Continue Eliquis.   - Will refer to AF Clinic on d/c to discuss possible ablation. Given young age would likel to avoid long-term exposure to [facility], if possible  5. Hypokalemia.  - K 3.7 will [place] for d/c home today from HF perspective  HF meds  Amiodarone 200 bid Eliquis 5 bid Spiro 25 daily Losartan 50 daily (decreased from 100 daily) Hydralazine 25 tid (decreased form 37.5 tid) Imdur 30 daily (decreased from 60 daily)  Farxiga 10 daily Torsemide 20mg  MWF (decreased from daily) Kdur 71meq MWF with torsemide  Will arrange f/u in HF Clinic  Length of Stay: [place], [place]  05/31/2021, 9:55 AM  Advanced Heart Failure Team Pager [phone] (M-F; 7a - 5p)  Please contact [facility] for night-coverage after hours (5p -7a ) and weekends on [email or web address]

## 2021-06-01 ENCOUNTER — Encounter (HOSPITAL_COMMUNITY): Payer: Self-pay | Admitting: Internal Medicine

## 2021-06-02 LAB — PTH-RELATED PEPTIDE: PTH-related peptide: <2 pmol/L

## 2021-06-09 ENCOUNTER — Other Ambulatory Visit: Payer: Self-pay

## 2021-06-09 ENCOUNTER — Ambulatory Visit (INDEPENDENT_AMBULATORY_CARE_PROVIDER_SITE_OTHER): Payer: 59 | Admitting: Registered Nurse

## 2021-06-09 ENCOUNTER — Encounter: Payer: Self-pay | Admitting: Registered Nurse

## 2021-06-09 VITALS — BP 137/93 | HR 67 | Temp 98.2°F | Resp 18 | Ht 63.0 in | Wt 288.0 lb

## 2021-06-09 DIAGNOSIS — Z6841 Body Mass Index (BMI) 40.0 and over, adult: Secondary | ICD-10-CM

## 2021-06-09 DIAGNOSIS — I509 Heart failure, unspecified: Secondary | ICD-10-CM

## 2021-06-09 DIAGNOSIS — N179 Acute kidney failure, unspecified: Secondary | ICD-10-CM

## 2021-06-09 DIAGNOSIS — Z09 Encounter for follow-up examination after completed treatment for conditions other than malignant neoplasm: Secondary | ICD-10-CM

## 2021-06-09 LAB — BASIC METABOLIC PANEL
BUN: 12 mg/dL (ref 6–23)
CO2: 25 mEq/L (ref 19–32)
Calcium: 10.5 mg/dL (ref 8.4–10.5)
Chloride: 103 mEq/L (ref 96–112)
Creatinine, Ser: 1.13 mg/dL (ref 0.40–1.20)
GFR: 56.18 mL/min — ABNORMAL LOW (ref >60.00)
Glucose, Bld: 80 mg/dL (ref 70–99)
Potassium: 3.9 mEq/L (ref 3.5–5.1)
Sodium: 136 mEq/L (ref 135–145)

## 2021-06-09 NOTE — Patient Instructions (Addendum)
[name] -   Christine Oneal to see you, sorry it was so soon! I'll let you know how today's labs look. If endocrinology doesn't call you, I'll see about getting in touch with them I'll follow along with the cardiology chart from this coming Thursday.  Please call with any concerns  Thank you  Rich     If you have lab work done today you will be contacted with your lab results within the next 2 weeks.  If you have not heard from [place] then please contact us. The fastest way to get your results is to register for My Chart.   IF you received an x-ray today, you will receive an invoice from [facility]. Please contact [facility] at [phone] with questions or concerns regarding your invoice.   IF you received labwork today, you will receive an invoice from [facility]. Please contact [facility] at [phone] with questions or concerns regarding your invoice.   Our billing staff will not be able to assist you with questions regarding bills from these companies.  You will be contacted with the lab results as soon as they are available. The fastest way to get your results is to activate your My Chart account. Instructions are located on the last page of this paperwork. If you have not heard from [place] regarding the results in 2 weeks, please contact this office.

## 2021-06-09 NOTE — Progress Notes (Signed)
Established Patient Office Visit  Subjective:  Patient ID: Christine Oneal, female    DOB: 05-21-1971  Age: 50 y.o. MRN: [id number]  CC:  Chief Complaint  Patient presents with   Hospitalization Follow-up    Patient states she is here for hospital follow up for elevated calcium    HPI Christine Oneal presents for HFU  Initially presented to ER on 05/03/21 with shob and cough ongoing x 3 weeks. Had been seen via E-visit and tx with abx. ER found her HR to be 150s with afib. Started IV amiodarone. BNP to 670, stable troponin, cxr showing CHF.  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  IV lasix then switched to torsemide Started on spironolactone, losartan, imdur/hydralazine, farxiga, and digoxin Unsuccessfull dccv. Increased amiodarone to 200mg  po bid, started on eliquis.  Hypercalcemica noted in hospitalization. Was to have this rechecked at follow up visit.  Experienced decreased GFR in hospital as well, had planned to follow up in outpatient setting.   Unfortunately these labs showed continually elevated Ca with result 13.9 and GFR continued to decline to 35.68. She was referred back to ER by MD on call.  During admission labs confirmed elevated Ca. Low phosphorus. Mild elevation in magnesium. PTH elevated to 91. MM panel unremarkable.  She has been referred to Endo for work up for hypercalcemia. Appt pending. She does have outpatient follow up with cardiology scheduled for 06/11/21.  Has been started on home health PT. Doing well. Wants to contine Has rolling walker with seat and shower bench. No falls at home.  Past Medical History:  Diagnosis Date   Abnormal Pap smear of cervix    Allergy    Anemia    CHF (congestive heart failure) (HCC)    Fibroid    Hypertension    Leukocytosis, unspecified 10/18/2013   Obesity    Plantar fasciitis     Past Surgical History:   Procedure Laterality Date   CARDIOVERSION N/A 05/12/2021   Procedure: CARDIOVERSION;  Surgeon: Jolaine Artist, MD;  Location: [facility] ENDOSCOPY;  Service: Cardiovascular;  Laterality: N/A;   CARDIOVERSION N/A 05/29/2021   Procedure: CARDIOVERSION;  Surgeon: Jolaine Artist, MD;  Location: [facility] ENDOSCOPY;  Service: Cardiovascular;  Laterality: N/A;   COLPOSCOPY     RIGHT/LEFT HEART CATH AND CORONARY ANGIOGRAPHY N/A 05/11/2021   Procedure: RIGHT/LEFT HEART CATH AND CORONARY ANGIOGRAPHY;  Surgeon: Jolaine Artist, MD;  Location: [place] CV LAB;  Service: Cardiovascular;  Laterality: N/A;   TEE WITHOUT CARDIOVERSION N/A 05/12/2021   Procedure: TRANSESOPHAGEAL ECHOCARDIOGRAM (TEE);  Surgeon: Jolaine Artist, MD;  Location: [facility] ENDOSCOPY;  Service: Cardiovascular;  Laterality: N/A;   TUBAL LIGATION  2001    Family History  Problem Relation Age of Onset   Hypertension Mother    Cervical cancer Mother    Hypertension Father    Stroke Father    Uterine cancer Maternal Grandmother    Diabetes Sister    Hypertension Brother    Hypertension Brother     Social History   Socioeconomic History   Marital status: Divorced    Spouse name: Not on file   Number of children: 4   Years of education: Not on file   Highest education level: Not on file  Occupational History   Occupation: CNA    Employer: Information systems manager   Tobacco Use   Smoking status: Never   Smokeless tobacco: Never  Vaping Use   Vaping Use: Never used  Substance and Sexual Activity   Alcohol use: No   Drug use: No   Sexual activity: Yes    Partners: Male    Birth control/protection: Surgical    Comment: BTL  Other Topics Concern   Not on file  Social History Narrative   Paytyn has 4 children. Three live at home with her.   Social Determinants of Health   Financial Resource Strain: Not on file  Food Insecurity: No Food Insecurity   Worried About Charity fundraiser in the Last Year: Never true   Ran Out of  Food in the Last Year: Never true  Transportation Needs: No Transportation Needs   Lack of Transportation (Medical): No   Lack of Transportation (Non-Medical): No  Physical Activity: Not on file  Stress: Not on file  Social Connections: Not on file  Intimate Partner Violence: Not on file    Outpatient Medications Prior to Visit  Medication Sig Dispense Refill   acetaminophen (TYLENOL) 500 MG tablet Take 1,000 mg by mouth every 6 (six) hours as needed for moderate pain or headache.     amiodarone (PACERONE) 200 MG tablet Take 1 tablet (200 mg total) by mouth 2 (two) times daily. 60 tablet 0   apixaban (ELIQUIS) 5 MG TABS tablet Take 1 tablet (5 mg total) by mouth 2 (two) times daily. 60 tablet 0   cholecalciferol (VITAMIN D3) 10 MCG (400 UNIT) TABS tablet Take 1 tablet (400 Units total) by mouth daily. 30 tablet 0   dapagliflozin propanediol (FARXIGA) 10 MG TABS tablet Take 1 tablet (10 mg total) by mouth daily. 30 tablet 0   hydrALAZINE (APRESOLINE) 25 MG tablet Take 1 tablet (25 mg total) by mouth 3 (three) times daily. 90 tablet 0   isosorbide mononitrate (IMDUR) 30 MG 24 hr tablet Take 1 tablet (30 mg total) by mouth daily. 30 tablet 0   losartan (COZAAR) 50 MG tablet Take 1 tablet (50 mg total) by mouth daily. 30 tablet 0   potassium chloride SA (KLOR-CON) 20 MEQ tablet Take 1 tablet (20 mEq total) by mouth every Monday, Wednesday, and Friday. 30 tablet 0   Probiotic CHEW Chew 2 capsules by mouth daily.     spironolactone (ALDACTONE) 25 MG tablet Take 1 tablet (25 mg total) by mouth daily. 30 tablet 0   torsemide (DEMADEX) 20 MG tablet Take 1 tablet (20 mg total) by mouth every Monday, Wednesday, and Friday. 30 tablet 0   No facility-administered medications prior to visit.    Allergies  Allergen Reactions   Lisinopril-Hydrochlorothiazide Anaphylaxis   Other Anaphylaxis    ROS Review of Systems  Constitutional: Negative.   HENT: Negative.    Eyes: Negative.   Respiratory:  Negative.    Cardiovascular: Negative.   Gastrointestinal: Negative.   Genitourinary: Negative.   Musculoskeletal: Negative.   Skin: Negative.   Neurological: Negative.   Psychiatric/Behavioral: Negative.    All other systems reviewed and are negative.    Objective:    Physical Exam Vitals and nursing note reviewed.  Constitutional:      General: She is not in acute distress.    Appearance: Normal appearance. She is normal weight. She is not ill-appearing, toxic-appearing or diaphoretic.  Cardiovascular:     Rate and Rhythm: Normal rate and regular rhythm.     Heart sounds: Normal heart sounds. No murmur heard.   No friction rub. No gallop.  Pulmonary:     Effort:  Pulmonary effort is normal. No respiratory distress.     Breath sounds: Normal breath sounds. No stridor. No wheezing, rhonchi or rales.  Chest:     Chest wall: No tenderness.  Skin:    General: Skin is warm and dry.  Neurological:     General: No focal deficit present.     Mental Status: She is alert and oriented to person, place, and time. Mental status is at baseline.  Psychiatric:        Mood and Affect: Mood normal.        Behavior: Behavior normal.        Thought Content: Thought content normal.        Judgment: Judgment normal.    BP (!) 137/93   Pulse 67   Temp 98.2 F (36.8 C) (Temporal)   Resp 18   Ht 5\' 3"  (1.6 m)   Wt 288 lb (130.6 kg)   SpO2 99%   BMI 51.02 kg/m  Wt Readings from Last 3 Encounters:  06/09/21 288 lb (130.6 kg)  05/31/21 288 lb 6.4 oz (130.8 kg)  05/25/21 282 lb 12.8 oz (128.3 kg)     Health Maintenance Due  Topic Date Due   COVID-19 Vaccine (1) Never done   COLONOSCOPY (Pts 45-16yrs Insurance coverage will need to be confirmed)  02/28/2017   MAMMOGRAM  Never done   Zoster Vaccines- Shingrix (1 of 2) Never done   PAP SMEAR-Modifier  06/16/2020    There are no preventive care reminders to display for this patient.  Lab Results  Component Value Date   TSH 3.322  05/26/2021   Lab Results  Component Value Date   WBC 6.7 05/29/2021   HGB 10.1 (L) 05/29/2021   HCT 31.1 (L) 05/29/2021   MCV 92.0 05/29/2021   PLT 390 05/29/2021   Lab Results  Component Value Date   NA 134 (L) 05/31/2021   K 3.7 05/31/2021   CO2 25 05/31/2021   GLUCOSE 101 (H) 05/31/2021   BUN 13 05/31/2021   CREATININE 1.20 (H) 05/31/2021   BILITOT 0.4 05/27/2021   ALKPHOS 83 05/27/2021   AST 23 05/27/2021   ALT 24 05/27/2021   PROT 6.6 05/27/2021   ALBUMIN 3.0 (L) 05/27/2021   CALCIUM 9.8 05/31/2021   ANIONGAP 7 05/31/2021   GFR 35.68 (L) 05/25/2021   Lab Results  Component Value Date   CHOL 193 05/25/2021   Lab Results  Component Value Date   HDL 47.60 05/25/2021   Lab Results  Component Value Date   LDLCALC 125 (H) 05/25/2021   Lab Results  Component Value Date   TRIG 102.0 05/25/2021   Lab Results  Component Value Date   CHOLHDL 4 05/25/2021   Lab Results  Component Value Date   HGBA1C 5.8 (H) 05/06/2021      Assessment & Plan:   Problem List Items Addressed This Visit       Cardiovascular and Mediastinum   Acute congestive heart failure ([place])     Genitourinary   AKI (acute kidney injury) ([place])   Relevant Orders   Basic Metabolic Panel (BMET)     Other   Morbid obesity with BMI of 50.0-59.9, adult ([place])   Hypercalcemia   Relevant Orders   Basic Metabolic Panel (BMET)   Other Visit Diagnoses     Hospital discharge follow-up    -  Primary       No orders of the defined types were placed in this encounter.   Follow-up:  Return in about 3 months (around 09/08/2021) for follow up .   PLAN Repeat BMP to ensure stability in Ca and GFR. Follow up pending results.  Encourage patient to continue healthy lifestyle choices. Low threshold for follow up in office or ER. Patient encouraged to call clinic with any questions, comments, or concerns.  Maximiano Coss, NP

## 2021-06-10 NOTE — Progress Notes (Addendum)
PCP: Maximiano Coss, NP Primary Cardiologist: Dr. 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   Recently admitted from 10/23- 11/3 w/ new atrial fibrillation and CHF. She was admitted under Cardiology service and AHF consulted. D/t concern for low-output HF she was started on IV milrinone. Echo with LVEF < 20%, moderate LVH, moderately reduced RV, severe MR. 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 R/LHC 10/31 with normal coronary arteries, RA 2, PA 31/8, Fick CI 2.5, PA sat 67%. Diuresed with IV lasix then transitioned to po torsemide, weight down total of 31 lb. D/C weight 287 lb.  Initiated GDMT with spiro, losartan, imdur/hydralazine (no bidil d/t cost), farxiga and digoxin.     Unsuccessful DCCV despite three shocks on 11/01. IV amio switched to po 200 mg BID. Anticoagulated with Eliquis.    Day of discharge Scr bumped from 1 > 1.5, CVP 1. Given 500 cc bolus of NS. Torsemide held w/ intent to restart on 11/05 at 20 mg daily.   Pt had post hospital f/u w/ her PCP on 11/14 and complained of constipation and palpitations. Labs showed severe hypercalcemia and AKI secondary to overdiuresis as well as multiple medications.  Ca 13.9. SCr 1.65 (baseline 0.9). Given IV hydration and IV pamidronate. Referred to the ED and was admitted under hospitalist service. Scr gradually improved to 1.20. Restarted on spiro, losartan and torsemide prior to discharge. Remained in AF with RVR on admit, underwernt DCCV on 11/18 with brief return to NSR then back to AF. Back in NSR on day of discharge.  She is here today for HF f/u. Patient reports feeling great. Dyspnea markedly improved from several weeks ago. No longer having orthopnea. No lower extremity edema. Denies CP or palpitations. May get a little lightheaded if she changes positions too quickly. Has been watching fluid and sodium intake.  Home weight 285 lb, reports 289 lb at discharge.   She expressed gratitude for all of her care during recent admissions.   Taking all medications as prescribed. She has been working with home PT.   ROS: All systems negative except as listed in HPI, PMH and Problem List.  SH:  Social History   Socioeconomic History   Marital status: Divorced    Spouse name: Not on file   Number of children: 4   Years of education: Not on file   Highest education level: Not on file  Occupational History   Occupation: CNA    Employer: Information systems manager   Tobacco Use   Smoking status: Never   Smokeless tobacco: Never  Vaping Use   Vaping Use: Never used  Substance and Sexual Activity   Alcohol use: No   Drug use: No   Sexual activity: Yes    Partners: Male    Birth control/protection: Surgical    Comment: BTL  Other Topics Concern   Not on file  Social History Narrative   Aryanna has 4 children. Three live at home with her.   Social Determinants of Health   Financial Resource Strain: Not on file  Food Insecurity: No Food Insecurity   Worried About Charity fundraiser in the Last Year: Never true   Ran Out of Food in the Last Year: Never true  Transportation Needs: No Transportation Needs   Lack of Transportation (Medical): No   Lack of Transportation (Non-Medical): No  Physical Activity: Not on file  Stress:  Not on file  Social Connections: Not on file  Intimate Partner Violence: Not on file    FH:  Family History  Problem Relation Age of Onset   Hypertension Mother    Cervical cancer Mother    Hypertension Father    Stroke Father    Uterine cancer Maternal Grandmother    Diabetes Sister    Hypertension Brother    Hypertension Brother     Past Medical History:  Diagnosis Date   Abnormal Pap smear of cervix    Allergy    Anemia    CHF (congestive heart failure) (HCC)    Fibroid    Hypertension    Leukocytosis, unspecified 10/18/2013   Obesity    Plantar fasciitis      Current Outpatient Medications  Medication Sig Dispense Refill   acetaminophen (TYLENOL) 500 MG tablet Take 1,000 mg by mouth every 6 (six) hours as needed for moderate pain or headache.     cholecalciferol (VITAMIN D3) 10 MCG (400 UNIT) TABS tablet Take 1 tablet (400 Units total) by mouth daily. 30 tablet 0   Probiotic CHEW Chew 2 capsules by mouth daily.     amiodarone (PACERONE) 200 MG tablet Take 1 tablet (200 mg total) by mouth 2 (two) times daily. 60 tablet 3   apixaban (ELIQUIS) 5 MG TABS tablet Take 1 tablet (5 mg total) by mouth 2 (two) times daily. 60 tablet 3   dapagliflozin propanediol (FARXIGA) 10 MG TABS tablet Take 1 tablet (10 mg total) by mouth daily. 30 tablet 3   hydrALAZINE (APRESOLINE) 25 MG tablet Take 1 tablet (25 mg total) by mouth 3 (three) times daily. 90 tablet 3   isosorbide mononitrate (IMDUR) 30 MG 24 hr tablet Take 1 tablet (30 mg total) by mouth daily. 30 tablet 3   losartan (COZAAR) 50 MG tablet Take 1 tablet (50 mg total) by mouth 2 (two) times daily. 60 tablet 3   [START ON 06/12/2021] potassium chloride SA (KLOR-CON M) 20 MEQ tablet Take 1 tablet (20 mEq total) by mouth every Monday, Wednesday, and Friday. 30 tablet 3   spironolactone (ALDACTONE) 25 MG tablet Take 1 tablet (25 mg total) by mouth daily. 30 tablet 3   [START ON 06/12/2021] torsemide (DEMADEX) 20 MG tablet Take 1 tablet (20 mg total) by mouth every Monday, Wednesday, and Friday. 30 tablet 3   No current facility-administered medications for this encounter.    Vitals:   06/11/21 1015  BP: 140/76  Pulse: 85  SpO2: 100%  Weight: 131.3 kg    PHYSICAL EXAM:  General:  Well appearing HEENT: normal Neck: supple. JVP flat. Carotids 2+ bilaterally; no bruits.  Cor: PMI normal. Regular rate & rhythm. No rubs, gallops, 2/6 holosystolic murmur Lungs: clear Abdomen: soft, nontender, nondistended. Obese. Extremities: no cyanosis, clubbing, rash, edema Neuro: alert & orientedx3, cranial nerves  grossly intact. Moves all 4 extremities w/o difficulty. Affect pleasant.   ECG: NSR 86 bpm, Qtc 449 ms   ASSESSMENT & PLAN:  1. Chronic systolic CHF: Nonischemic CMP, ?tachy-mediated.  Last study was TEE in 11/22 showing E 25-30%, normal RV, mild MR.   - Volume status looks good today. Currently NYHA II. Taking 20 mg Torsemide three times weekly (M, W, F) - Continue hydralazine 25 mg TID - Continue imdur 30 mg daily - Continue farxiga 10 mg daily - Continue spiro 25 mg daily - Increase losartan to 50 mg BID - All HF medications refilled - Plan to add beta blocker at  f/u visit if remains stable - BMET in 1 week - Referred for home sleep study - Echo in 2 months to reassess LV function and EF  2. Atrial fibrillation with RVR/AFL with RVR:  - Failed DCCV 10/22.  - Underwent attempted DC-CV 05/29/21 Brief return to NSR then back to AF.  - Converted with amio 06/01/21 - SR on ECG today - Continue po amio 200 mg BID for now - Continue Eliquis.   - Will refer to AF Clinic on d/c to discuss possible ablation. Given young age would likel to avoid long-term exposure to Operating Room Services, if possible  3. Hypercalcemia:  - Up to 13.9 prompting admission 11/22.  - Received IV fluids and IV pamidronate - Improved to 9.8 on d/c, 10.5 on labs 2 days ago.  - Not on thiazide diuretic - Planning to see endocrine  4. Hx AKI:  - Creatinine 0.9 at baseline => 1.63 during recent admit in setting of diuresis with acute systolic CHF and multiple medications.   - Cr 1.2 at discharge, stable at 1.13 2 days ago - BMET in 1 week since increasing Losartan - Now just on torsemide three times a week  5. HTN: - BP above goal. - Losartan increased as above   Follow-up: 4 weeks with APP to reassess volume and potentially add beta blocker, 2 months with Dr. Haroldine Laws with echo

## 2021-06-11 ENCOUNTER — Other Ambulatory Visit: Payer: Self-pay

## 2021-06-11 ENCOUNTER — Telehealth (HOSPITAL_COMMUNITY): Payer: Self-pay | Admitting: *Deleted

## 2021-06-11 ENCOUNTER — Ambulatory Visit (HOSPITAL_COMMUNITY)
Admit: 2021-06-11 | Discharge: 2021-06-11 | Disposition: A | Discharge status: Home / Self Care | Payer: 59 | Attending: Family Medicine | Admitting: Family Medicine

## 2021-06-11 ENCOUNTER — Encounter (HOSPITAL_COMMUNITY): Payer: Self-pay

## 2021-06-11 VITALS — BP 140/76 | HR 85 | Wt 289.4 lb

## 2021-06-11 DIAGNOSIS — I110 Hypertensive heart disease with heart failure: Secondary | ICD-10-CM | POA: Insufficient documentation

## 2021-06-11 DIAGNOSIS — Z79899 Other long term (current) drug therapy: Secondary | ICD-10-CM | POA: Diagnosis not present

## 2021-06-11 DIAGNOSIS — I480 Paroxysmal atrial fibrillation: Secondary | ICD-10-CM | POA: Diagnosis not present

## 2021-06-11 DIAGNOSIS — I4891 Unspecified atrial fibrillation: Secondary | ICD-10-CM | POA: Diagnosis not present

## 2021-06-11 DIAGNOSIS — N179 Acute kidney failure, unspecified: Secondary | ICD-10-CM | POA: Diagnosis not present

## 2021-06-11 DIAGNOSIS — Z7901 Long term (current) use of anticoagulants: Secondary | ICD-10-CM | POA: Insufficient documentation

## 2021-06-11 DIAGNOSIS — I5022 Chronic systolic (congestive) heart failure: Secondary | ICD-10-CM | POA: Diagnosis not present

## 2021-06-11 DIAGNOSIS — I10 Essential (primary) hypertension: Secondary | ICD-10-CM

## 2021-06-11 MED ORDER — ISOSORBIDE MONONITRATE ER 30 MG PO TB24: 30.0000 mg | ORAL_TABLET | Freq: Every day | ORAL | 3 refills | Status: DC

## 2021-06-11 MED ORDER — SPIRONOLACTONE 25 MG PO TABS: 25.0000 mg | ORAL_TABLET | Freq: Every day | ORAL | 3 refills | Status: DC

## 2021-06-11 MED ORDER — HYDRALAZINE HCL 25 MG PO TABS
25.0000 mg | ORAL_TABLET | Freq: Three times a day (TID) | ORAL | 3 refills | Status: DC
Start: 2021-06-11 — End: 2021-09-07

## 2021-06-11 MED ORDER — LOSARTAN POTASSIUM 50 MG PO TABS: 50.0000 mg | ORAL_TABLET | Freq: Two times a day (BID) | ORAL | 3 refills | Status: DC

## 2021-06-11 MED ORDER — TORSEMIDE 20 MG PO TABS: 20.0000 mg | ORAL_TABLET | ORAL | 3 refills | Status: DC

## 2021-06-11 MED ORDER — AMIODARONE HCL 200 MG PO TABS: 200.0000 mg | ORAL_TABLET | Freq: Two times a day (BID) | ORAL | 3 refills | Status: DC

## 2021-06-11 MED ORDER — APIXABAN 5 MG PO TABS: 5.0000 mg | ORAL_TABLET | Freq: Two times a day (BID) | ORAL | 3 refills | Status: DC

## 2021-06-11 MED ORDER — POTASSIUM CHLORIDE CRYS ER 20 MEQ PO TBCR: 20.0000 meq | EXTENDED_RELEASE_TABLET | ORAL | 3 refills | Status: DC

## 2021-06-11 MED ORDER — DAPAGLIFLOZIN PROPANEDIOL 10 MG PO TABS
10.0000 mg | ORAL_TABLET | Freq: Every day | ORAL | 3 refills | Status: DC
Start: 2021-06-11 — End: 2021-10-12

## 2021-06-11 NOTE — Patient Instructions (Signed)
INCREASE Losartan to 50 mg one tab twice a day  Labs needed in 7-10 days  Your physician has recommended that you have a sleep study. This test records several body functions during sleep, including: brain activity, eye movement, oxygen and carbon dioxide blood levels, heart rate and rhythm, breathing rate and rhythm, the flow of air through your mouth and nose, snoring, body muscle movements, and chest and belly movement.  You have been referred to [facility] -they will be in contact with an appointment  Your physician recommends that you schedule a follow-up appointment in: 4 weeks  in the Advanced Practitioners (PA/NP) Clinic and in 2-3 months with Dr Haroldine Laws with and echo  Your physician has requested that you have an echocardiogram. Echocardiography is a painless test that uses sound waves to create images of your heart. It provides your doctor with information about the size and shape of your heart and how well your heart's chambers and valves are working. This procedure takes approximately one hour. There are no restrictions for this procedure.  Do the following things EVERYDAY: Weigh yourself in the morning before breakfast. Write it down and keep it in a log. Take your medicines as prescribed Eat low salt foods--Limit salt (sodium) to 2000 mg per day.  Stay as active as you can everyday Limit all fluids for the day to less than 2 liters  At the [facility], you and your health needs are our priority. As part of our continuing mission to provide you with exceptional heart care, we have created designated Provider Care Teams. These Care Teams include your primary Cardiologist (physician) and Advanced Practice Providers (APPs- Physician Assistants and Nurse Practitioners) who all work together to provide you with the care you need, when you need it.   You may see any of the following providers on your designated Care Team at your next follow up: Dr Glori Bickers Dr Haynes Kerns, NP Lyda Jester, [place] [facility], [place] Audry Riles, PharmD   Please be sure to bring in all your medications bottles to every appointment.   If you have any questions or concerns before your next appointment please send [place] a message through [place] or call our office at [phone].    TO LEAVE A MESSAGE FOR THE NURSE SELECT OPTION 2, PLEASE LEAVE A MESSAGE INCLUDING: YOUR NAME DATE OF BIRTH CALL BACK NUMBER REASON FOR CALL**this is important as we prioritize the call backs  YOU WILL RECEIVE A CALL BACK THE SAME DAY AS LONG AS YOU CALL BEFORE 4:00 PM

## 2021-06-11 NOTE — Progress Notes (Signed)
Home sleep study device given to patient along with instuctions to perform the test.  Patient agrees to wait until I call her regarding insurance approval to proceed.

## 2021-06-11 NOTE — Telephone Encounter (Signed)
V.O for home health skilled nursing given to [facility]

## 2021-06-16 ENCOUNTER — Telehealth (HOSPITAL_COMMUNITY): Payer: Self-pay | Admitting: Surgery

## 2021-06-16 ENCOUNTER — Telehealth (HOSPITAL_COMMUNITY): Payer: Self-pay | Admitting: *Deleted

## 2021-06-16 MED ORDER — CARVEDILOL 6.25 MG PO TABS: 6.2500 mg | ORAL_TABLET | Freq: Two times a day (BID) | ORAL | 6 refills | Status: DC

## 2021-06-16 NOTE — Telephone Encounter (Signed)
Spoke with [facility], she informed me of patient bp 176/95 at 3pm and 174/96 at 4pm.  HR was 83. Spoke with Allena Katz NP and she gave order for Coreg 6.25mg  Twice daily For patient to start and record Bp Twice daily. Prescription sent to pharmacy.

## 2021-06-16 NOTE — Telephone Encounter (Signed)
Patient called for Stop [facility] assessment.  Date:  06/16/2021 STOP BANG RISK ASSESSMENT S (snore) Have you been told that you snore?   no    NO   T (tired) Are you often tired, fatigued, or sleepy during the day? yes  YES  O (obstruction) Do you stop breathing, choke, or gasp during sleep?  no NO   P (pressure) Do you have or are you being treated for high blood pressure?  yes YES   B (BMI) Is your body index greater than 35 kg/m? YES   A (age) Are you 51 years old or older? yes YES   N (neck) Do you have a neck circumference greater than 16 inches?  no  NO   G (gender) Are you a female?  no NO   TOTAL STOP/BANG "YES" ANSWERS 4                                                                       For Office Use Only              Procedure Order Form    YES to 3+ Stop Bang questions OR two clinical symptoms - patient qualifies for WatchPAT (CPT 95800)             Clinical Notes: Will consult Sleep Specialist and refer for management of therapy due to patient increased risk of Sleep Apnea. Ordering a sleep study due to the following two clinical symptoms: Excessive daytime sleepiness G47.10 / Gastroesophageal reflux K21.9 / Nocturia R35.1 / Morning Headaches G44.221 / Difficulty concentrating R41.840 / Memory problems or poor judgment G31.84 / Personality changes or irritability R45.4 / Loud snoring R06.83 / Depression F32.9 / Unrefreshed by sleep G47.8 / Impotence N52.9 / History of high blood pressure R03.0 / Insomnia G47.00

## 2021-06-16 NOTE — Telephone Encounter (Signed)
Request for sleep study auth faxed to [employer] at [phone]

## 2021-06-16 NOTE — Telephone Encounter (Signed)
Pts HHRN Christine Oneal left vm stating pts bp was slightly elevated 154/85 and pt c/o headache pain level 3/10.  RN advised pt to monitor bp but wanted to know if we wanted to make any medication changes.  Call back # [phone]   Please advise (routed to [facility])

## 2021-06-17 ENCOUNTER — Telehealth (HOSPITAL_COMMUNITY): Payer: Self-pay | Admitting: *Deleted

## 2021-06-17 ENCOUNTER — Telehealth (HOSPITAL_COMMUNITY): Payer: Self-pay | Admitting: Surgery

## 2021-06-17 NOTE — Telephone Encounter (Signed)
No pre cert reqd for home sleep study

## 2021-06-17 NOTE — Telephone Encounter (Signed)
I called patient to inform her that insurance pre cert is not required and that she can proceed with her ordered home sleep study.

## 2021-06-17 NOTE — Telephone Encounter (Signed)
Ms. Blevins called asking to rescheduled her HF lab appt for tomorrow.  I rescheduled to her requested date of Monday Dec 12th.

## 2021-06-18 ENCOUNTER — Other Ambulatory Visit (HOSPITAL_COMMUNITY): Payer: 59

## 2021-06-19 ENCOUNTER — Other Ambulatory Visit: Payer: Self-pay | Admitting: Registered Nurse

## 2021-06-22 ENCOUNTER — Other Ambulatory Visit: Payer: Self-pay

## 2021-06-22 ENCOUNTER — Ambulatory Visit (HOSPITAL_COMMUNITY)
Admission: RE | Admit: 2021-06-22 | Discharge: 2021-06-22 | Disposition: A | Discharge status: Home / Self Care | Payer: 59 | Source: Ambulatory Visit | Attending: Internal Medicine | Admitting: Internal Medicine

## 2021-06-22 DIAGNOSIS — I5022 Chronic systolic (congestive) heart failure: Secondary | ICD-10-CM | POA: Diagnosis present

## 2021-06-22 LAB — BASIC METABOLIC PANEL
Anion gap: 7 (ref 5–15)
BUN: 17 mg/dL (ref 6–20)
CO2: 25 mmol/L (ref 22–32)
Calcium: 10.2 mg/dL (ref 8.9–10.3)
Chloride: 107 mmol/L (ref 98–111)
Creatinine, Ser: 1.38 mg/dL — ABNORMAL HIGH (ref 0.44–1.00)
GFR, Estimated: 46 mL/min — ABNORMAL LOW (ref >60)
Glucose, Bld: 103 mg/dL — ABNORMAL HIGH (ref 70–99)
Potassium: 4.3 mmol/L (ref 3.5–5.1)
Sodium: 139 mmol/L (ref 135–145)

## 2021-06-23 ENCOUNTER — Telehealth (HOSPITAL_COMMUNITY): Payer: Self-pay | Admitting: Surgery

## 2021-06-23 MED ORDER — TORSEMIDE 20 MG PO TABS: 20.0000 mg | ORAL_TABLET | ORAL | 3 refills | Status: DC | PRN

## 2021-06-23 MED ORDER — POTASSIUM CHLORIDE CRYS ER 20 MEQ PO TBCR
20.0000 meq | EXTENDED_RELEASE_TABLET | ORAL | 3 refills | Status: DC | PRN
Start: 2021-06-23 — End: 2022-09-13

## 2021-06-23 NOTE — Telephone Encounter (Signed)
-----   Message from Joette Catching, [place] sent at 06/22/2021  1:08 PM EST ----- Scr up slightly. If weight going down and has no swelling in legs, would decrease Torsemide to jsut as needed. Take KCL only when taking torsemide.

## 2021-06-23 NOTE — Telephone Encounter (Signed)
Patient called and results reviewed as well as recommendations per Marlyce Huge PA.  Patient says her weight has been stable.  I have updated Torsemide and Potassium to as needed on medication list  in [facility].

## 2021-06-24 ENCOUNTER — Encounter (INDEPENDENT_AMBULATORY_CARE_PROVIDER_SITE_OTHER): Payer: 59 | Admitting: Nurse Practitioner

## 2021-06-24 DIAGNOSIS — R0683 Snoring: Secondary | ICD-10-CM

## 2021-06-24 DIAGNOSIS — G4733 Obstructive sleep apnea (adult) (pediatric): Secondary | ICD-10-CM

## 2021-06-25 ENCOUNTER — Ambulatory Visit (HOSPITAL_COMMUNITY): Payer: 59 | Admitting: Physician Assistant

## 2021-06-26 ENCOUNTER — Telehealth (INDEPENDENT_AMBULATORY_CARE_PROVIDER_SITE_OTHER): Payer: 59 | Admitting: Registered Nurse

## 2021-06-26 ENCOUNTER — Encounter: Payer: Self-pay | Admitting: Registered Nurse

## 2021-06-26 VITALS — Wt 280.4 lb

## 2021-06-26 DIAGNOSIS — J069 Acute upper respiratory infection, unspecified: Secondary | ICD-10-CM | POA: Diagnosis not present

## 2021-06-26 MED ORDER — DM-GUAIFENESIN ER 30-600 MG PO TB12: 1.0000 | ORAL_TABLET | Freq: Two times a day (BID) | ORAL | 0 refills | Status: DC

## 2021-06-26 MED ORDER — AZELASTINE HCL 0.1 % NA SOLN: 1.0000 | Freq: Two times a day (BID) | NASAL | 12 refills | Status: AC

## 2021-06-26 MED ORDER — AMOXICILLIN-POT CLAVULANATE 875-125 MG PO TABS: 1.0000 | ORAL_TABLET | Freq: Two times a day (BID) | ORAL | 0 refills | Status: DC

## 2021-06-26 NOTE — Progress Notes (Signed)
Telemedicine Encounter- SOAP NOTE Established Patient  This telephone encounter was conducted with the patient's (or proxy's) verbal consent via audio telecommunications: yes/no: Yes Patient was instructed to have this encounter in a suitably private space; and to only have persons present to whom they give permission to participate. In addition, patient identity was confirmed by use of name plus two identifiers (DOB and address).  I discussed the limitations, risks, security and privacy concerns of performing an evaluation and management service by telephone and the availability of in person appointments. I also discussed with the patient that there may be a patient responsible charge related to this service. The patient expressed understanding and agreed to proceed.  I spent a total of 16 minutes talking with the patient or their proxy.  Patient at home Provider in office  Participants: Kathrin Ruddy, NP and [name]  Chief Complaint  Patient presents with   Nasal Congestion    Patient states since [place] she has been experiencing some nasal congestion and cough. Pt has not taken any medication at this time but getting a little better .    Subjective   Christine Oneal is a 50 y.o. established patient. Telephone visit today for cough  HPI Cough and congestion Onset on Tues-Wednes  Congestion in nose. Some pnd leading to cough.   Notes grandbaby with similar symptoms - tested negative for flu and covid.  Has not taken any medication for relief given her concerns about her medical conditions.  Patient Active Problem List   Diagnosis Date Noted   Hypercalcemia 16/04/9603   Chronic systolic CHF (congestive heart failure) ([place]) 05/26/2021   AKI (acute kidney injury) ([place]) 54/03/8118   Acute systolic (congestive) heart failure ([place]) 05/05/2021   Unspecified atrial fibrillation ([place]) 05/04/2021   Acute congestive heart failure ([place])    Irregular heart beats 03/18/2020    Anemia 09/04/2017   Morbid obesity with BMI of 50.0-59.9, adult ([place]) 09/03/2017   Reactive airway disease 09/03/2017   Vitamin D deficiency 09/03/2017   Leukocytosis 10/18/2013   Essential hypertension 10/24/2007   EXTERNAL HEMORRHOIDS 10/24/2007   ANAL FISSURE 10/24/2007    Past Medical History:  Diagnosis Date   Abnormal Pap smear of cervix    Allergy    Anemia    CHF (congestive heart failure) (HCC)    Fibroid    Hypertension    Leukocytosis, unspecified 10/18/2013   Obesity    Plantar fasciitis     Current Outpatient Medications  Medication Sig Dispense Refill   acetaminophen (TYLENOL) 500 MG tablet Take 1,000 mg by mouth every 6 (six) hours as needed for moderate pain or headache.     amiodarone (PACERONE) 200 MG tablet Take 1 tablet (200 mg total) by mouth 2 (two) times daily. 60 tablet 3   amoxicillin-clavulanate (AUGMENTIN) 875-125 MG tablet Take 1 tablet by mouth 2 (two) times daily. 20 tablet 0   apixaban (ELIQUIS) 5 MG TABS tablet Take 1 tablet (5 mg total) by mouth 2 (two) times daily. 60 tablet 3   azelastine (ASTELIN) 0.1 % nasal spray Place 1 spray into both nostrils 2 (two) times daily. Use in each nostril as directed 30 mL 12   carvedilol (COREG) 6.25 MG tablet Take 1 tablet (6.25 mg total) by mouth 2 (two) times daily. 90 tablet 6   cholecalciferol (VITAMIN D3) 10 MCG (400 UNIT) TABS tablet Take 1 tablet (400 Units total) by mouth daily. 30 tablet 0   dapagliflozin propanediol (FARXIGA) 10  MG TABS tablet Take 1 tablet (10 mg total) by mouth daily. 30 tablet 3   dextromethorphan-guaiFENesin (MUCINEX DM) 30-600 MG 12hr tablet Take 1 tablet by mouth 2 (two) times daily. 20 tablet 0   hydrALAZINE (APRESOLINE) 25 MG tablet Take 1 tablet (25 mg total) by mouth 3 (three) times daily. 90 tablet 3   isosorbide mononitrate (IMDUR) 30 MG 24 hr tablet Take 1 tablet (30 mg total) by mouth daily. 30 tablet 3   losartan (COZAAR) 50 MG tablet Take 1 tablet (50 mg total) by  mouth 2 (two) times daily. 60 tablet 3   potassium chloride SA (KLOR-CON M) 20 MEQ tablet Take 1 tablet (20 mEq total) by mouth as needed (Take when taking Torsemide). 30 tablet 3   Probiotic CHEW Chew 2 capsules by mouth daily.     spironolactone (ALDACTONE) 25 MG tablet Take 1 tablet (25 mg total) by mouth daily. 30 tablet 3   torsemide (DEMADEX) 20 MG tablet Take 1 tablet (20 mg total) by mouth as needed (when weight is up or notes swelling). 30 tablet 3   No current facility-administered medications for this visit.    Allergies  Allergen Reactions   Lisinopril-Hydrochlorothiazide Anaphylaxis   Other Anaphylaxis    Social History   Socioeconomic History   Marital status: Divorced    Spouse name: Not on file   Number of children: 4   Years of education: Not on file   Highest education level: Not on file  Occupational History   Occupation: CNA    Employer: Information systems manager   Tobacco Use   Smoking status: Never   Smokeless tobacco: Never  Vaping Use   Vaping Use: Never used  Substance and Sexual Activity   Alcohol use: No   Drug use: No   Sexual activity: Yes    Partners: Male    Birth control/protection: Surgical    Comment: BTL  Other Topics Concern   Not on file  Social History Narrative   Sophronia has 4 children. Three live at home with her.   Social Determinants of Health   Financial Resource Strain: Not on file  Food Insecurity: No Food Insecurity   Worried About Charity fundraiser in the Last Year: Never true   Ran Out of Food in the Last Year: Never true  Transportation Needs: No Transportation Needs   Lack of Transportation (Medical): No   Lack of Transportation (Non-Medical): No  Physical Activity: Not on file  Stress: Not on file  Social Connections: Not on file  Intimate Partner Violence: Not on file    Review of Systems  Constitutional: Negative.   HENT:  Positive for congestion. Negative for ear discharge, ear pain, hearing loss, nosebleeds, sinus  pain, sore throat and tinnitus.   Eyes: Negative.   Respiratory: Negative.  Negative for stridor.   Cardiovascular: Negative.   Gastrointestinal: Negative.   Genitourinary: Negative.   Musculoskeletal: Negative.   Skin: Negative.   Neurological: Negative.   Endo/Heme/Allergies: Negative.   Psychiatric/Behavioral: Negative.    All other systems reviewed and are negative.  Objective   Vitals as reported by the patient: Today's Vitals   06/26/21 1033  Weight: 280 lb 6.4 oz (127.2 kg)    Ronnell was seen today for nasal congestion.  Diagnoses and all orders for this visit:  Upper respiratory tract infection, unspecified type -     azelastine (ASTELIN) 0.1 % nasal spray; Place 1 spray into both nostrils 2 (two) times daily. Use in  each nostril as directed -     dextromethorphan-guaiFENesin (MUCINEX DM) 30-600 MG 12hr tablet; Take 1 tablet by mouth 2 (two) times daily. -     amoxicillin-clavulanate (AUGMENTIN) 875-125 MG tablet; Take 1 tablet by mouth 2 (two) times daily.    PLAN Supportive relief with mucinex and azelastine. Neti pot and tylenol recommended If no improvement in 48-72 hours, start augmentin po bid  Return if worsening or failing to improve. Patient encouraged to call clinic with any questions, comments, or concerns.  I discussed the assessment and treatment plan with the patient. The patient was provided an opportunity to ask questions and all were answered. The patient agreed with the plan and demonstrated an understanding of the instructions.   The patient was advised to call back or seek an in-person evaluation if the symptoms worsen or if the condition fails to improve as anticipated.  I provided 13 minutes of non-face-to-face time during this encounter.  Maximiano Coss, NP

## 2021-06-26 NOTE — Patient Instructions (Signed)
° ° ° °  If you have lab work done today you will be contacted with your lab results within the next 2 weeks.  If you have not heard from us then please contact us. The fastest way to get your results is to register for My Chart. ° ° °IF you received an x-ray today, you will receive an invoice from [facility]. Please contact [facility] at [phone] with questions or concerns regarding your invoice.  ° °IF you received labwork today, you will receive an invoice from [facility]. Please contact [facility] at [phone] with questions or concerns regarding your invoice.  ° °Our billing staff will not be able to assist you with questions regarding bills from these companies. ° °You will be contacted with the lab results as soon as they are available. The fastest way to get your results is to activate your My Chart account. Instructions are located on the last page of this paperwork. If you have not heard from us regarding the results in 2 weeks, please contact this office. °  ° ° ° °

## 2021-06-27 NOTE — Procedures (Signed)
° °  Sleep Study Report  Patient Information  Study Date: 06/24/21 Patient Name: Christine Oneal Patient ID: [id number] Birth Date: Aug 06, 2070 Age: 50 Gender: Female Referring Physician: Roderic Palau, NP  TEST DESCRIPTION: Home sleep apnea testing was completed using the WatchPat, a Type 1 device, utilizing peripheral arterial tonometry (PAT), chest movement, actigraphy, pulse oximetry, pulse rate, body position and snore. AHI was calculated with apnea and hypopnea using valid sleep time as the denominator. RDI includes apneas, hypopneas, and RERAs. The data acquired and the scoring of sleep and all associated events were performed in accordance with the recommended standards and specifications as outlined in the AASM Manual for the Scoring of Sleep and Associated Events 2.2.0 (2015).  FINDINGS: 1. No evidence of Obstructive Sleep Apnea with AHI 3.2/hr. 2. No Central Sleep Apnea. 3. Oxygen desaturations as low as 85%. 4. Minimal snoring was present. O2 sats were < 88% for < 0.4 minutes. 5. Total sleep time was 6 hrs and 0 min. 6. 27.5% of total sleep time was spent in REM sleep. 7. Normal sleep onset latency at 19 min. 8. Normal REM sleep onset latency at 84 min. 9. Total awakenings were 1.  DIAGNOSIS: Normal study with no significant sleep disordered breathing.  RECOMMENDATIONS: 1. Normal study with no significant sleep disordered breathing.  2. Healthy sleep recommendations include: adequate nightly sleep (normal 7-9 hrs/night), avoidance of caffeine after noon and alcohol near bedtime, and maintaining a sleep environment that is cool, dark and quiet.  3. Weight loss for overweight patients is recommended.  4. Snoring recommendations include: weight loss where appropriate, side sleeping, and avoidance of alcohol before bed.  5. Operation of motor vehicle or dangerous equipment must be avoided when feeling drowsy, excessively sleepy, or mentally fatigued.  6. An ENT  consultation which may be useful for specific causes of and possible treatment of bothersome snoring .  7. Weight loss may be of benefit in reducing the severity of snoring.   Signature: Electronically Signed: 06/27/21 Fransico Him, MD; [facility]; [place], American Board of Sleep Medicine

## 2021-06-29 ENCOUNTER — Encounter (HOSPITAL_COMMUNITY): Payer: Self-pay | Admitting: Nurse Practitioner

## 2021-06-29 ENCOUNTER — Other Ambulatory Visit: Payer: Self-pay

## 2021-06-29 ENCOUNTER — Ambulatory Visit (HOSPITAL_COMMUNITY)
Admission: RE | Admit: 2021-06-29 | Discharge: 2021-06-29 | Disposition: A | Discharge status: Home / Self Care | Payer: 59 | Source: Ambulatory Visit | Attending: Physician Assistant | Admitting: Physician Assistant

## 2021-06-29 VITALS — BP 144/92 | HR 95 | Ht 63.0 in | Wt 285.4 lb

## 2021-06-29 DIAGNOSIS — I509 Heart failure, unspecified: Secondary | ICD-10-CM | POA: Diagnosis not present

## 2021-06-29 DIAGNOSIS — D6869 Other thrombophilia: Secondary | ICD-10-CM | POA: Diagnosis not present

## 2021-06-29 DIAGNOSIS — I4819 Other persistent atrial fibrillation: Secondary | ICD-10-CM | POA: Diagnosis present

## 2021-06-29 DIAGNOSIS — Z7901 Long term (current) use of anticoagulants: Secondary | ICD-10-CM | POA: Diagnosis not present

## 2021-06-29 NOTE — Patient Instructions (Signed)
Continue Amiodarone 200 mg Twice daily for this week  Monday 07/06/21 decrease Amiodarone to 200 mg Daily  You have been referred to EP to discuss getting an ablation, they will call you for an appointment

## 2021-06-29 NOTE — Progress Notes (Addendum)
Primary Care Physician: Maximiano Coss, NP Referring Physician: [facility] hospital f/u  [facility]- [name] is a 50 y.o. 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 She was placed on amiodarone and cardioverted. She was in SR on discharge and again on  12/5. On presentation ekg, she was in Afib, rate controlled. She was asymptomatic. This appointment was made at time of d/c to be considered for an ablation to prevent long term amio use. She does not drink alcohol, consume large amts of caffeine or smoke. She finished a home sleep study but has not heard the results. She continues on amiodarone 200 mg bid. She is on eliquis 5 mg bid for a CHA2DS2VASc score of 3.   On ascultation pt was regular, repeat ekg showed NSR at a rate of 82 bpm.    Today, she denies symptoms of palpitations, chest pain, shortness of breath, orthopnea, PND, lower extremity edema, dizziness, presyncope, syncope, or neurologic sequela. The patient is tolerating medications without difficulties and is otherwise without complaint today.   Past Medical History:  Diagnosis Date   Abnormal Pap smear of cervix    Allergy    Anemia    CHF (congestive heart failure) (HCC)    Fibroid    Hypertension    Leukocytosis, unspecified 10/18/2013   Obesity    Plantar fasciitis    Past Surgical History:  Procedure Laterality Date   CARDIOVERSION N/A 05/12/2021   Procedure: CARDIOVERSION;  Surgeon: Jolaine Artist, MD;  Location: [facility] ENDOSCOPY;  Service: Cardiovascular;  Laterality: N/A;   CARDIOVERSION N/A 05/29/2021   Procedure: CARDIOVERSION;  Surgeon: Jolaine Artist, MD;  Location: [facility] ENDOSCOPY;  Service: Cardiovascular;  Laterality: N/A;   COLPOSCOPY     RIGHT/LEFT HEART CATH AND CORONARY ANGIOGRAPHY N/A 05/11/2021   Procedure: RIGHT/LEFT HEART CATH AND CORONARY ANGIOGRAPHY;  Surgeon: Jolaine Artist, MD;  Location: [facility] LAB;   Service: Cardiovascular;  Laterality: N/A;   TEE WITHOUT CARDIOVERSION N/A 05/12/2021   Procedure: TRANSESOPHAGEAL ECHOCARDIOGRAM (TEE);  Surgeon: Jolaine Artist, MD;  Location: [facility] ENDOSCOPY;  Service: Cardiovascular;  Laterality: N/A;   TUBAL LIGATION  2001    Current Outpatient Medications  Medication Sig Dispense Refill   acetaminophen (TYLENOL) 500 MG tablet Take 1,000 mg by mouth every 6 (six) hours as needed for moderate pain or headache.     amiodarone (PACERONE) 200 MG tablet Take 1 tablet (200 mg total) by mouth 2 (two) times daily. 60 tablet 3   amoxicillin-clavulanate (AUGMENTIN) 875-125 MG tablet Take 1 tablet by mouth 2 (two) times daily. 20 tablet 0   apixaban (ELIQUIS) 5 MG TABS tablet Take 1 tablet (5 mg total) by mouth 2 (two) times daily. 60 tablet 3   azelastine (ASTELIN) 0.1 % nasal spray Place 1 spray into both nostrils 2 (two) times daily. Use in each nostril as directed 30 mL 12   carvedilol (COREG) 6.25 MG tablet Take 1 tablet (6.25 mg total) by mouth 2 (two) times daily. 90 tablet 6   cholecalciferol (VITAMIN D3) 10 MCG (400 UNIT) TABS tablet Take 1 tablet (400 Units total) by mouth daily. 30 tablet 0   dapagliflozin propanediol (FARXIGA) 10 MG TABS tablet Take 1 tablet (10 mg total) by mouth daily. 30 tablet 3   dextromethorphan-guaiFENesin (MUCINEX DM) 30-600 MG 12hr tablet Take 1 tablet by mouth 2 (two) times daily. 20 tablet 0   hydrALAZINE (  APRESOLINE) 25 MG tablet Take 1 tablet (25 mg total) by mouth 3 (three) times daily. 90 tablet 3   isosorbide mononitrate (IMDUR) 30 MG 24 hr tablet Take 1 tablet (30 mg total) by mouth daily. 30 tablet 3   losartan (COZAAR) 50 MG tablet Take 1 tablet (50 mg total) by mouth 2 (two) times daily. 60 tablet 3   potassium chloride SA (KLOR-CON M) 20 MEQ tablet Take 1 tablet (20 mEq total) by mouth as needed (Take when taking Torsemide). 30 tablet 3   Probiotic CHEW Chew 2 capsules by mouth daily.     spironolactone (ALDACTONE)  25 MG tablet Take 1 tablet (25 mg total) by mouth daily. 30 tablet 3   torsemide (DEMADEX) 20 MG tablet Take 1 tablet (20 mg total) by mouth as needed (when weight is up or notes swelling). 30 tablet 3   No current facility-administered medications for this encounter.    Allergies  Allergen Reactions   Lisinopril-Hydrochlorothiazide Anaphylaxis   Other Anaphylaxis    Social History   Socioeconomic History   Marital status: Divorced    Spouse name: Not on file   Number of children: 4   Years of education: Not on file   Highest education level: Not on file  Occupational History   Occupation: CNA    Employer: Information systems manager   Tobacco Use   Smoking status: Never   Smokeless tobacco: Never  Vaping Use   Vaping Use: Never used  Substance and Sexual Activity   Alcohol use: No   Drug use: No   Sexual activity: Yes    Partners: Male    Birth control/protection: Surgical    Comment: BTL  Other Topics Concern   Not on file  Social History Narrative   Christine Oneal has 4 children. Three live at home with her.   Social Determinants of Health   Financial Resource Strain: Not on file  Food Insecurity: No Food Insecurity   Worried About Charity fundraiser in the Last Year: Never true   Ran Out of Food in the Last Year: Never true  Transportation Needs: No Transportation Needs   Lack of Transportation (Medical): No   Lack of Transportation (Non-Medical): No  Physical Activity: Not on file  Stress: Not on file  Social Connections: Not on file  Intimate Partner Violence: Not on file    Family History  Problem Relation Age of Onset   Hypertension Mother    Cervical cancer Mother    Hypertension Father    Stroke Father    Uterine cancer Maternal Grandmother    Diabetes Sister    Hypertension Brother    Hypertension Brother     ROS- All systems are reviewed and negative except as per the HPI above  Physical Exam: Vitals:   06/29/21 0914  BP: (!) 144/92  Pulse: 95  Weight:  129.5 kg  Height: 5\' 3"  (1.6 m)   Wt Readings from Last 3 Encounters:  06/29/21 129.5 kg  06/26/21 127.2 kg  06/11/21 131.3 kg    Labs: Lab Results  Component Value Date   NA 139 06/22/2021   K 4.3 06/22/2021   CL 107 06/22/2021   CO2 25 06/22/2021   GLUCOSE 103 (H) 06/22/2021   BUN 17 06/22/2021   CREATININE 1.38 (H) 06/22/2021   CALCIUM 10.2 06/22/2021   PHOS 2.1 (L) 05/26/2021   MG 2.0 05/30/2021   Lab Results  Component Value Date   INR 1.2 05/05/2021   Lab Results  Component Value Date   CHOL 193 05/25/2021   HDL 47.60 05/25/2021   LDLCALC 125 (H) 05/25/2021   TRIG 102.0 05/25/2021     GEN- The patient is well appearing, alert and oriented x 3 today.   Head- normocephalic, atraumatic Eyes-  Sclera clear, conjunctiva pink Ears- hearing intact Oropharynx- clear Neck- supple, no JVP Lymph- no cervical lymphadenopathy Lungs- Clear to ausculation bilaterally, normal work of breathing Heart- Regular rate and rhythm, no murmurs, rubs or gallops, PMI not laterally displaced GI- soft, NT, ND, + BS Extremities- no clubbing, cyanosis, or edema MS- no significant deformity or atrophy Skin- no rash or lesion Psych- euthymic mood, full affect Neuro- strength and sensation are intact  Initially ekg showed afib at 95 bpm, qrs int 90 ms, qtc 407 ms  Then ekg repeated as was regular on auscultation and showed  NSR at 82 bpm, pr int 144 ms, qrs int 94 bpm, qtc 455 ms   Echo- 10/24/221. With Definity contrast, the very large papilarry muscles are  visualized. The apex is akinetic. There is possible early thrombus  formmation in the apex. . Left ventricular ejection fraction, by  estimation, is <20%. The left ventricle has severely  decreased function. The left ventricle demonstrates global hypokinesis.  The left ventricular internal cavity size was moderately dilated. There is  moderate concentric left ventricular hypertrophy. Left ventricular  diastolic function could  not be  evaluated.   2. Right ventricular systolic function is moderately reduced. The right  ventricular size is moderately enlarged.   3. Left atrial size was mildly dilated.   4. Right atrial size was mildly dilated.   5. The mitral valve is grossly normal. Severe mitral valve regurgitation.   6. Tricuspid valve regurgitation is mild to moderate.   7. The aortic valve is grossly normal. Aortic valve regurgitation is  mild. No aortic stenosis is present.   TEE - 05/12/21- 1. Left ventricular ejection fraction, by estimation, is 25 to 30%. The  left ventricle has severely decreased function. The left ventricle  demonstrates global hypokinesis.   2. Right ventricular systolic function is normal. The right ventricular  size is normal.   3. Left atrial size was moderately dilated. No left atrial/left atrial  appendage thrombus was detected.   4. Right atrial size was moderately dilated.   5. The mitral valve is normal in structure. Mild mitral valve  regurgitation.   6. The aortic valve is normal in structure. Aortic valve regurgitation is  trivial.   7. There is mild (Grade II) plaque involving the descending aorta.   8. Evidence of atrial level shunting detected by color flow Doppler.  There is a small patent foramen ovale.   Assessment and Plan:  1. New onset persistent  afib associated with CHF/TCM  Pt was loaded on amio and cardioversion during hospitalization in early November  showed afib to SR then back to afib. She was in SR the next am. She was found to be in SR on 12/5 and afib initially  on presentation  to the afib clinic this am but then returned to [place] prior to end of visit.  She has been loading on amiodarone at 200 mg bid since early November so starting on 07/06/21, decrease amiodarone to one tablet daily I discussed pursuing ablation with her to avoid potential long term  SE profile of amiodarone and she would like to proceed Sleep study pending   2. BMI of  50.56 Weight loss/ exercise encouraged  3. CHA2DS2VASc  score of 3 Continue eliquis 5 mg bid   4. Acute on chronic HF ? Tachy mediated  Appears normovolemic today  Per Dr. Haroldine Laws    Referred to EP to be considered for ablation    Butch Penny C. Naoki Migliaccio, [facility] [address] [place], [place] [address] [phone]

## 2021-07-03 NOTE — Progress Notes (Signed)
PCP: Maximiano Coss, NP Primary Cardiologist: Dr. Haroldine Laws  HPI: Christine Oneal is a 50 y.o. 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   Admitted from 05/03/21- 05/14/21 w/ new atrial fibrillation and CHF. She was admitted under Cardiology service and AHF consulted. D/t concern for low-output HF she was started on IV milrinone. Echo with LVEF < 20%, moderate LVH, moderately reduced RV, severe MR. 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 R/LHC 10/31 with normal coronary arteries, RA 2, PA 31/8, Fick CI 2.5, PA sat 67%. Diuresed with IV lasix then transitioned to po torsemide, weight down total of 31 lb. D/C weight 287 lb.  Initiated GDMT with spiro, losartan, imdur/hydralazine (no bidil d/t cost), farxiga and digoxin.     Unsuccessful DCCV x 3 shocks 11/22. IV amio switched to po 200 mg bid. Anticoagulated with Eliquis.    Pt had post hospital f/u w/ her PCP on 11/14 and complained of constipation and palpitations. Labs showed severe hypercalcemia and AKI secondary to overdiuresis as well as multiple medications.  Ca 13.9. SCr 1.65 (baseline 0.9). Given IV hydration and IV pamidronate. Referred to the ED and was admitted under hospitalist service. Scr gradually improved to 1.20. Restarted on spiro, losartan and torsemide prior to discharge. Remained in AF with RVR on admit, underwernt DCCV on 11/18 with brief return to NSR then back to AF. Back in NSR on day of discharge.  Today she returns for HF follow up. No significant dyspnea walking on flat ground or with ADLs, but does get fatigued with increased activity. Sometimes feels when she is out of rhythm, but denies obvious palpitations. Denies abnormal bleeding, CP, dizziness, edema, or PND/Orthopnea. Appetite ok. No fever or chills. Weight at home 277 pounds. Taking all medications. She is working with home PT. Took torsemide yesterday, usually takes  about once a week.  Cardiac Studies: - Echo (10/22): EF < 20%, RV moderately down, severe MR, mild to mod TR - TEE in (11/22): EF 25-30%, normal RV, mild MR.    - RHC (10/22):  Findings: Ao = 156/89 (112) LV =135/7 RA = 2 RV = 27/6 PA = 31/8 (20) PCW = 7 Fick cardiac output/index = 5.6/2.5 PVR = 2.2 WU FA sat = 99% PA sat = 67%. 69% SVC sat = 72%   Assessment: 1. Normal coronary arteries 2. 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 Well-compensated hemodynamics after large-volume diuresis  ROS: All systems negative except as listed in HPI, PMH and Problem List.  SH:  Social History   Socioeconomic History   Marital status: Divorced    Spouse name: Not on file   Number of children: 4   Years of education: Not on file   Highest education level: Not on file  Occupational History   Occupation: CNA    Employer: Information systems manager   Tobacco Use   Smoking status: Never   Smokeless tobacco: Never  Vaping Use   Vaping Use: Never used  Substance and Sexual Activity   Alcohol use: No   Drug use: No   Sexual activity: Yes    Partners: Male    Birth control/protection: Surgical    Comment: BTL  Other Topics Concern   Not on file  Social History Narrative   Christine Oneal has 4 children. Three live at home with her.   Social Determinants of Health   Financial Resource Strain: Not  on file  Food Insecurity: No Food Insecurity   Worried About Charity fundraiser in the Last Year: Never true   Ran Out of Food in the Last Year: Never true  Transportation Needs: No Transportation Needs   Lack of Transportation (Medical): No   Lack of Transportation (Non-Medical): No  Physical Activity: Not on file  Stress: Not on file  Social Connections: Not on file  Intimate Partner Violence: Not on file    FH:  Family History  Problem Relation Age of Onset   Hypertension Mother    Cervical cancer Mother    Hypertension Father    Stroke Father    Uterine cancer  Maternal Grandmother    Diabetes Sister    Hypertension Brother    Hypertension Brother     Past Medical History:  Diagnosis Date   Abnormal Pap smear of cervix    Allergy    Anemia    CHF (congestive heart failure) (HCC)    Fibroid    Hypertension    Leukocytosis, unspecified 10/18/2013   Obesity    Plantar fasciitis     Current Outpatient Medications  Medication Sig Dispense Refill   acetaminophen (TYLENOL) 500 MG tablet Take 1,000 mg by mouth every 6 (six) hours as needed for moderate pain or headache.     amiodarone (PACERONE) 200 MG tablet Take 200 mg by mouth daily.     amoxicillin-clavulanate (AUGMENTIN) 875-125 MG tablet Take 1 tablet by mouth 2 (two) times daily. 20 tablet 0   apixaban (ELIQUIS) 5 MG TABS tablet Take 1 tablet (5 mg total) by mouth 2 (two) times daily. 60 tablet 3   azelastine (ASTELIN) 0.1 % nasal spray Place 1 spray into both nostrils 2 (two) times daily. Use in each nostril as directed 30 mL 12   carvedilol (COREG) 6.25 MG tablet Take 1 tablet (6.25 mg total) by mouth 2 (two) times daily. 90 tablet 6   cholecalciferol (VITAMIN D3) 10 MCG (400 UNIT) TABS tablet Take 1 tablet (400 Units total) by mouth daily. 30 tablet 0   dapagliflozin propanediol (FARXIGA) 10 MG TABS tablet Take 1 tablet (10 mg total) by mouth daily. 30 tablet 3   dextromethorphan-guaiFENesin (MUCINEX DM) 30-600 MG 12hr tablet Take 1 tablet by mouth 2 (two) times daily. 20 tablet 0   hydrALAZINE (APRESOLINE) 25 MG tablet Take 1 tablet (25 mg total) by mouth 3 (three) times daily. 90 tablet 3   isosorbide mononitrate (IMDUR) 30 MG 24 hr tablet Take 1 tablet (30 mg total) by mouth daily. 30 tablet 3   losartan (COZAAR) 50 MG tablet Take 1 tablet (50 mg total) by mouth 2 (two) times daily. 60 tablet 3   potassium chloride SA (KLOR-CON M) 20 MEQ tablet Take 1 tablet (20 mEq total) by mouth as needed (Take when taking Torsemide). 30 tablet 3   Probiotic CHEW Chew 2 capsules by mouth daily.      spironolactone (ALDACTONE) 25 MG tablet Take 1 tablet (25 mg total) by mouth daily. 30 tablet 3   torsemide (DEMADEX) 20 MG tablet Take 1 tablet (20 mg total) by mouth as needed (when weight is up or notes swelling). 30 tablet 3   No current facility-administered medications for this encounter.   BP 120/72    Pulse 84    Ht 5\' 3"  (1.6 m)    Wt 127.7 kg    SpO2 100%    BMI 49.88 kg/m   Wt Readings from Last 3 Encounters:  07/09/21 127.7 kg  07/08/21 126.8 kg  06/29/21 129.5 kg   PHYSICAL EXAM: General:  NAD. No resp difficulty, walked into clinic. HEENT: Normal Neck: Supple. No JVD. Carotids 2+ bilat; no bruits. No lymphadenopathy or thryomegaly appreciated. Cor: PMI nondisplaced. Regular rate & rhythm. No rubs, gallops, 2/6 HSM Lungs: Clear Abdomen: Soft, nontender, nondistended. No hepatosplenomegaly. No bruits or masses. Good bowel sounds. Extremities: No cyanosis, clubbing, rash, trace BLE edema Neuro: Alert & oriented x 3, cranial nerves grossly intact. Moves all 4 extremities w/o difficulty. Affect pleasant.  ASSESSMENT & PLAN:  1. Chronic systolic CHF:  - Nonischemic CMP, ? tachy-mediated.   - Echo (10/22): EF < 20%, RV moderately down, severe MR, mild to mod TR - R/LHC (05/11/21): normal cors, RA 2, PA 31/8, Fick CI 2.5, PA sat 67% - TEE in (11/22): EF 25-30%, normal RV, mild MR.   - NYHA II. Volume looks good today. - Continue torsemide 20 mg daily PRN. - Continue hydralazine 25 mg tid + Imdur 30 mg daily. - Continue Farxiga 10 mg daily. - Continue spiro 25 mg daily. - Continue losartan 50 mg bid. - Continue carvedilol 6.25 mg bid. - Referred for home sleep study - Given Rx for compression hose as needed. - Echo in 2 months to reassess LV function and EF - Labs today.  2. Atrial fibrillation with RVR/AFL:  - Failed DCCV 10/22.  - Underwent attempted DC-CV 05/29/21 Brief return to NSR then back to AF.  - Converted with amio 06/01/21 - Regular on exam today. -  CHA2DS2VASc score of 3. - Continue Eliquis.   - Continue amiodarone 200 mg daily for now (given young age would like to avoid long-term exposure to [facility], if possible). - She has been referred to EP for ablation consideration.  3. H/o Hypercalcemia:  - Up to 13.9 prompting admission 11/22.  - Received IV fluids and IV pamidronate. - Not on thiazide diuretic. - Resolved on recent labs. - Planning to see endocrine for further work up, given # to Dr. Quin Hoop office to follow up  4. HTN: - Well-controlled - Continue current medications.  Follow-up with Dr. Haroldine Laws + echo in 2 months as scheduled.  Allena Katz, FNP-BC 07/09/21

## 2021-07-07 ENCOUNTER — Telehealth: Payer: Self-pay

## 2021-07-07 NOTE — Telephone Encounter (Signed)
Error

## 2021-07-08 ENCOUNTER — Telehealth (INDEPENDENT_AMBULATORY_CARE_PROVIDER_SITE_OTHER): Payer: 59 | Admitting: Registered Nurse

## 2021-07-08 ENCOUNTER — Encounter: Payer: Self-pay | Admitting: Registered Nurse

## 2021-07-08 ENCOUNTER — Other Ambulatory Visit: Payer: Self-pay

## 2021-07-08 VITALS — BP 156/89 | Wt 279.6 lb

## 2021-07-08 DIAGNOSIS — I5022 Chronic systolic (congestive) heart failure: Secondary | ICD-10-CM

## 2021-07-08 DIAGNOSIS — R635 Abnormal weight gain: Secondary | ICD-10-CM

## 2021-07-08 NOTE — Patient Instructions (Signed)
° ° ° °  If you have lab work done today you will be contacted with your lab results within the next 2 weeks.  If you have not heard from us then please contact us. The fastest way to get your results is to register for My Chart. ° ° °IF you received an x-ray today, you will receive an invoice from [facility]. Please contact  Radiology at [phone] with questions or concerns regarding your invoice.  ° °IF you received labwork today, you will receive an invoice from [facility]. Please contact [facility] at [phone] with questions or concerns regarding your invoice.  ° °Our billing staff will not be able to assist you with questions regarding bills from these companies. ° °You will be contacted with the lab results as soon as they are available. The fastest way to get your results is to activate your My Chart account. Instructions are located on the last page of this paperwork. If you have not heard from us regarding the results in 2 weeks, please contact this office. °  ° ° ° °

## 2021-07-08 NOTE — Progress Notes (Signed)
Telemedicine Encounter- SOAP NOTE Established Patient  This telephone encounter was conducted with the patient's (or proxy's) verbal consent via audio telecommunications: yes/no: Yes Patient was instructed to have this encounter in a suitably private space; and to only have persons present to whom they give permission to participate. In addition, patient identity was confirmed by use of name plus two identifiers (DOB and address).  I discussed the limitations, risks, security and privacy concerns of performing an evaluation and management service by telephone and the availability of in person appointments. I also discussed with the patient that there may be a patient responsible charge related to this service. The patient expressed understanding and agreed to proceed.  I spent a total of 15 minutes talking with the patient or their proxy.  Patient at home Provider in office  Participants: Kathrin Ruddy, NP and [name]  Chief Complaint  Patient presents with   Weight Check    Patient states she would like to discuss her weight     Subjective   MAYARA Oneal is a 50 y.o. established patient. Telephone visit today for weight check   HPI Weight - notes she forgot to write it down for 2-3 days. It was at 276lbs within the past few days Yesterday was at 284lbs.  Notes she did take a torsemide 20mg  po qd  Today was 279.6lbs.  No symptoms - denies worsening swelling, chest congestion, doe, headaches, visual changes.   Otherwise, notes persistent upper respiratory symptoms - some ongoing congestion, some pnd/cough. These are stable, though. Not new since weight gain  Patient Active Problem List   Diagnosis Date Noted   Hypercalcemia 39/76/7341   Chronic systolic CHF (congestive heart failure) ([place]) 05/26/2021   AKI (acute kidney injury) ([place]) 93/79/0240   Acute systolic (congestive) heart failure ([place]) 05/05/2021   Unspecified atrial fibrillation ([place]) 05/04/2021    Acute congestive heart failure ([place])    Irregular heart beats 03/18/2020   Anemia 09/04/2017   Morbid obesity with BMI of 50.0-59.9, adult ([place]) 09/03/2017   Reactive airway disease 09/03/2017   Vitamin D deficiency 09/03/2017   Leukocytosis 10/18/2013   Essential hypertension 10/24/2007   EXTERNAL HEMORRHOIDS 10/24/2007   ANAL FISSURE 10/24/2007    Past Medical History:  Diagnosis Date   Abnormal Pap smear of cervix    Allergy    Anemia    CHF (congestive heart failure) (HCC)    Fibroid    Hypertension    Leukocytosis, unspecified 10/18/2013   Obesity    Plantar fasciitis     Current Outpatient Medications  Medication Sig Dispense Refill   acetaminophen (TYLENOL) 500 MG tablet Take 1,000 mg by mouth every 6 (six) hours as needed for moderate pain or headache.     amiodarone (PACERONE) 200 MG tablet Take 200 mg by mouth daily.     amoxicillin-clavulanate (AUGMENTIN) 875-125 MG tablet Take 1 tablet by mouth 2 (two) times daily. 20 tablet 0   apixaban (ELIQUIS) 5 MG TABS tablet Take 1 tablet (5 mg total) by mouth 2 (two) times daily. 60 tablet 3   azelastine (ASTELIN) 0.1 % nasal spray Place 1 spray into both nostrils 2 (two) times daily. Use in each nostril as directed 30 mL 12   carvedilol (COREG) 6.25 MG tablet Take 1 tablet (6.25 mg total) by mouth 2 (two) times daily. 90 tablet 6   cholecalciferol (VITAMIN D3) 10 MCG (400 UNIT) TABS tablet Take 1 tablet (400 Units total) by mouth daily. [name]  tablet 0   dapagliflozin propanediol (FARXIGA) 10 MG TABS tablet Take 1 tablet (10 mg total) by mouth daily. 30 tablet 3   dextromethorphan-guaiFENesin (MUCINEX DM) 30-600 MG 12hr tablet Take 1 tablet by mouth 2 (two) times daily. 20 tablet 0   hydrALAZINE (APRESOLINE) 25 MG tablet Take 1 tablet (25 mg total) by mouth 3 (three) times daily. 90 tablet 3   isosorbide mononitrate (IMDUR) 30 MG 24 hr tablet Take 1 tablet (30 mg total) by mouth daily. 30 tablet 3   losartan (COZAAR) 50 MG tablet  Take 1 tablet (50 mg total) by mouth 2 (two) times daily. 60 tablet 3   potassium chloride SA (KLOR-CON M) 20 MEQ tablet Take 1 tablet (20 mEq total) by mouth as needed (Take when taking Torsemide). 30 tablet 3   Probiotic CHEW Chew 2 capsules by mouth daily.     spironolactone (ALDACTONE) 25 MG tablet Take 1 tablet (25 mg total) by mouth daily. 30 tablet 3   torsemide (DEMADEX) 20 MG tablet Take 1 tablet (20 mg total) by mouth as needed (when weight is up or notes swelling). 30 tablet 3   No current facility-administered medications for this visit.    Allergies  Allergen Reactions   Lisinopril-Hydrochlorothiazide Anaphylaxis   Other Anaphylaxis    Social History   Socioeconomic History   Marital status: Divorced    Spouse name: Not on file   Number of children: 4   Years of education: Not on file   Highest education level: Not on file  Occupational History   Occupation: CNA    Employer: Information systems manager   Tobacco Use   Smoking status: Never   Smokeless tobacco: Never  Vaping Use   Vaping Use: Never used  Substance and Sexual Activity   Alcohol use: No   Drug use: No   Sexual activity: Yes    Partners: Male    Birth control/protection: Surgical    Comment: BTL  Other Topics Concern   Not on file  Social History Narrative   Christine Oneal has 4 children. Three live at home with her.   Social Determinants of Health   Financial Resource Strain: Not on file  Food Insecurity: No Food Insecurity   Worried About Charity fundraiser in the Last Year: Never true   Ran Out of Food in the Last Year: Never true  Transportation Needs: No Transportation Needs   Lack of Transportation (Medical): No   Lack of Transportation (Non-Medical): No  Physical Activity: Not on file  Stress: Not on file  Social Connections: Not on file  Intimate Partner Violence: Not on file    ROS  Objective   Vitals as reported by the patient: Today's Vitals   07/08/21 1611  BP: (!) 156/89  Weight: 279 lb  9.6 oz (126.8 kg)    Sharia was seen today for weight check.  Diagnoses and all orders for this visit:  Weight gain  Chronic systolic CHF (congestive heart failure) ([place])    PLAN Ok to continue torsemide as needed. Continue to monitor symptoms for acute exacrebation of HF.  Follow up with me and cardiology as scheduled. Patient encouraged to call clinic with any questions, comments, or concerns.  I discussed the assessment and treatment plan with the patient. The patient was provided an opportunity to ask questions and all were answered. The patient agreed with the plan and demonstrated an understanding of the instructions.   The patient was advised to call back or seek an  in-person evaluation if the symptoms worsen or if the condition fails to improve as anticipated.  I provided 15 minutes of non-face-to-face time during this encounter.  Maximiano Coss, NP

## 2021-07-09 ENCOUNTER — Encounter (HOSPITAL_COMMUNITY): Payer: Self-pay

## 2021-07-09 ENCOUNTER — Ambulatory Visit (HOSPITAL_COMMUNITY)
Admission: RE | Admit: 2021-07-09 | Discharge: 2021-07-09 | Disposition: A | Discharge status: Home / Self Care | Payer: 59 | Source: Ambulatory Visit | Attending: Family Medicine | Admitting: Family Medicine

## 2021-07-09 VITALS — BP 120/72 | HR 84 | Ht 63.0 in | Wt 281.6 lb

## 2021-07-09 DIAGNOSIS — I5021 Acute systolic (congestive) heart failure: Secondary | ICD-10-CM

## 2021-07-09 DIAGNOSIS — I4891 Unspecified atrial fibrillation: Secondary | ICD-10-CM | POA: Insufficient documentation

## 2021-07-09 DIAGNOSIS — Z7901 Long term (current) use of anticoagulants: Secondary | ICD-10-CM | POA: Insufficient documentation

## 2021-07-09 DIAGNOSIS — I5022 Chronic systolic (congestive) heart failure: Secondary | ICD-10-CM | POA: Diagnosis not present

## 2021-07-09 DIAGNOSIS — I480 Paroxysmal atrial fibrillation: Secondary | ICD-10-CM

## 2021-07-09 DIAGNOSIS — I110 Hypertensive heart disease with heart failure: Secondary | ICD-10-CM | POA: Insufficient documentation

## 2021-07-09 DIAGNOSIS — I428 Other cardiomyopathies: Secondary | ICD-10-CM | POA: Diagnosis not present

## 2021-07-09 DIAGNOSIS — Z79899 Other long term (current) drug therapy: Secondary | ICD-10-CM | POA: Insufficient documentation

## 2021-07-09 DIAGNOSIS — Z7984 Long term (current) use of oral hypoglycemic drugs: Secondary | ICD-10-CM | POA: Insufficient documentation

## 2021-07-09 DIAGNOSIS — I10 Essential (primary) hypertension: Secondary | ICD-10-CM

## 2021-07-09 LAB — BASIC METABOLIC PANEL
Anion gap: 7 (ref 5–15)
BUN: 18 mg/dL (ref 6–20)
CO2: 24 mmol/L (ref 22–32)
Calcium: 10.4 mg/dL — ABNORMAL HIGH (ref 8.9–10.3)
Chloride: 103 mmol/L (ref 98–111)
Creatinine, Ser: 1.26 mg/dL — ABNORMAL HIGH (ref 0.44–1.00)
GFR, Estimated: 52 mL/min — ABNORMAL LOW (ref >60)
Glucose, Bld: 105 mg/dL — ABNORMAL HIGH (ref 70–99)
Potassium: 3.7 mmol/L (ref 3.5–5.1)
Sodium: 134 mmol/L — ABNORMAL LOW (ref 135–145)

## 2021-07-09 NOTE — Patient Instructions (Addendum)
Thank you for coming in today  Labs were done today, if any labs are abnormal the clinic will call you  You were given a prescription for compression hose  Your physician recommends that you schedule a follow-up appointment in: please keep follow up appointment   At the [facility], you and your health needs are our priority. As part of our continuing mission to provide you with exceptional heart care, we have created designated Provider Care Teams. These Care Teams include your primary Cardiologist (physician) and Advanced Practice Providers (APPs- Physician Assistants and Nurse Practitioners) who all work together to provide you with the care you need, when you need it.   You may see any of the following providers on your designated Care Team at your next follow up: Dr Glori Bickers Dr Haynes Kerns, NP Lyda Jester, [place] [facility], [place] Audry Riles, PharmD   Please be sure to bring in all your medications bottles to every appointment.   If you have any questions or concerns before your next appointment please send [place] a message through [place] or call our office at [phone].    TO LEAVE A MESSAGE FOR THE NURSE SELECT OPTION 2, PLEASE LEAVE A MESSAGE INCLUDING: YOUR NAME DATE OF BIRTH CALL BACK NUMBER REASON FOR CALL**this is important as we prioritize the call backs  YOU WILL RECEIVE A CALL BACK THE SAME DAY AS LONG AS YOU CALL BEFORE 4:00 PM

## 2021-07-14 ENCOUNTER — Ambulatory Visit: Payer: 59

## 2021-07-14 DIAGNOSIS — I5022 Chronic systolic (congestive) heart failure: Secondary | ICD-10-CM

## 2021-07-15 ENCOUNTER — Encounter: Payer: Self-pay | Admitting: Registered Nurse

## 2021-07-22 ENCOUNTER — Telehealth: Payer: Self-pay | Admitting: *Deleted

## 2021-07-22 NOTE — Telephone Encounter (Signed)
Left message to return a call to discuss itamar sleep study results and recommendations.

## 2021-07-22 NOTE — Telephone Encounter (Signed)
-----   Message from Cleon Gustin, [place] sent at 07/01/2021 10:48 AM EST -----  ----- Message ----- From: Sueanne Margarita, MD Sent: 06/27/2021   8:53 AM EST To: Freada Bergeron, CMA  Normal home sleep study so in lab PSG will be ordered

## 2021-07-31 ENCOUNTER — Other Ambulatory Visit (HOSPITAL_COMMUNITY): Payer: Self-pay | Admitting: Physician Assistant

## 2021-08-06 NOTE — Telephone Encounter (Signed)
Patient returned a call to me and was given her itamar results and Dr Landis Gandy recommendation to do a in lab [place]. She agrees to proceed.

## 2021-08-06 NOTE — Telephone Encounter (Signed)
Left second message to return a call to me to discuss HST results and recommendations.

## 2021-08-06 NOTE — Telephone Encounter (Signed)
-----   Message from Cleon Gustin, [place] sent at 07/01/2021 10:48 AM EST -----  ----- Message ----- From: Sueanne Margarita, MD Sent: 06/27/2021   8:53 AM EST To: Freada Bergeron, CMA  Normal home sleep study so in lab PSG will be ordered

## 2021-08-06 NOTE — Telephone Encounter (Signed)
-----   Message from Cleon Gustin, [place] sent at 07/01/2021 10:48 AM EST -----  ----- Message ----- From: Sueanne Margarita, MD Sent: 06/27/2021   8:53 AM EST To: Freada Bergeron, CMA  Normal home sleep study so in lab PSG will be ordered

## 2021-08-12 ENCOUNTER — Other Ambulatory Visit: Payer: Self-pay | Admitting: Cardiology

## 2021-08-12 ENCOUNTER — Telehealth: Payer: Self-pay | Admitting: *Deleted

## 2021-08-12 DIAGNOSIS — I509 Heart failure, unspecified: Secondary | ICD-10-CM

## 2021-08-12 DIAGNOSIS — I10 Essential (primary) hypertension: Secondary | ICD-10-CM

## 2021-08-12 DIAGNOSIS — I5022 Chronic systolic (congestive) heart failure: Secondary | ICD-10-CM

## 2021-08-12 NOTE — Telephone Encounter (Signed)
Prior Authorization for [employer] sent to [employer] via Fax. [phone].

## 2021-08-21 ENCOUNTER — Telehealth: Payer: Self-pay | Admitting: Registered Nurse

## 2021-08-21 NOTE — Telephone Encounter (Signed)
FMLA form is in the back folder to be signed .

## 2021-08-21 NOTE — Telephone Encounter (Signed)
I have placed FMLA forms in the bin upfront with a charge sheet.  °

## 2021-08-24 ENCOUNTER — Other Ambulatory Visit: Payer: Self-pay

## 2021-08-24 ENCOUNTER — Encounter: Payer: Self-pay | Admitting: Internal Medicine

## 2021-08-24 ENCOUNTER — Ambulatory Visit (INDEPENDENT_AMBULATORY_CARE_PROVIDER_SITE_OTHER): Payer: 59 | Admitting: Internal Medicine

## 2021-08-24 VITALS — BP 126/80 | HR 73 | Ht 63.0 in | Wt 271.0 lb

## 2021-08-24 DIAGNOSIS — E210 Primary hyperparathyroidism: Secondary | ICD-10-CM | POA: Diagnosis not present

## 2021-08-24 NOTE — Progress Notes (Deleted)
Electrophysiology Office Note:    Date:  08/24/2021   ID:  Christine Oneal, DOB [birth year], MRN [id number]  PCP:  Maximiano Coss, NP  [facility] Cardiologist:  None  [facility] Electrophysiologist:  None   Referring MD: Sherran Needs, NP   Chief Complaint: Atrial fibrillation  History of Present Illness:    Christine Oneal is a 51 y.o. female who presents for an evaluation of atrial fibrillation at the request of Roderic Palau. Their medical history includes hypertension, chronic systolic heart failure.  Her cardiomyopathy was felt to be related to tachycardia/atrial arrhythmias so she was started on amiodarone and underwent a cardioversion.  She follows with Dr. Kae Heller in the heart failure clinic for her chronic systolic heart failure.     Past Medical History:  Diagnosis Date   Abnormal Pap smear of cervix    Allergy    Anemia    CHF (congestive heart failure) (HCC)    Fibroid    Hypertension    Leukocytosis, unspecified 10/18/2013   Obesity    Plantar fasciitis     Past Surgical History:  Procedure Laterality Date   CARDIOVERSION N/A 05/12/2021   Procedure: CARDIOVERSION;  Surgeon: Jolaine Artist, MD;  Location: [facility] ENDOSCOPY;  Service: Cardiovascular;  Laterality: N/A;   CARDIOVERSION N/A 05/29/2021   Procedure: CARDIOVERSION;  Surgeon: Jolaine Artist, MD;  Location: [facility] ENDOSCOPY;  Service: Cardiovascular;  Laterality: N/A;   COLPOSCOPY     RIGHT/LEFT HEART CATH AND CORONARY ANGIOGRAPHY N/A 05/11/2021   Procedure: RIGHT/LEFT HEART CATH AND CORONARY ANGIOGRAPHY;  Surgeon: Jolaine Artist, MD;  Location: [place] CV LAB;  Service: Cardiovascular;  Laterality: N/A;   TEE WITHOUT CARDIOVERSION N/A 05/12/2021   Procedure: TRANSESOPHAGEAL ECHOCARDIOGRAM (TEE);  Surgeon: Jolaine Artist, MD;  Location: [facility] ENDOSCOPY;  Service: Cardiovascular;  Laterality: N/A;   TUBAL LIGATION  2001    Current Medications: No outpatient medications have been  marked as taking for the 08/25/21 encounter (Appointment) with Vickie Epley, MD.     Allergies:   Lisinopril-hydrochlorothiazide and Other   Social History   Socioeconomic History   Marital status: Divorced    Spouse name: Not on file   Number of children: 4   Years of education: Not on file   Highest education level: Not on file  Occupational History   Occupation: CNA    Employer: Information systems manager   Tobacco Use   Smoking status: Never   Smokeless tobacco: Never  Vaping Use   Vaping Use: Never used  Substance and Sexual Activity   Alcohol use: No   Drug use: No   Sexual activity: Yes    Partners: Male    Birth control/protection: Surgical    Comment: BTL  Other Topics Concern   Not on file  Social History Narrative   Christine Oneal has 4 children. Three live at home with her.   Social Determinants of Health   Financial Resource Strain: Not on file  Food Insecurity: No Food Insecurity   Worried About Charity fundraiser in the Last Year: Never true   Ran Out of Food in the Last Year: Never true  Transportation Needs: No Transportation Needs   Lack of Transportation (Medical): No   Lack of Transportation (Non-Medical): No  Physical Activity: Not on file  Stress: Not on file  Social Connections: Not on file     Family History: The patient's family history includes Cervical cancer in her mother; Diabetes in her sister; Hypertension in  her brother, brother, father, and mother; Stroke in her father; Uterine cancer in her maternal grandmother.  ROS:   Please see the history of present illness.    All other systems reviewed and are negative.  EKGs/Labs/Other Studies Reviewed:    The following studies were reviewed today:  May 12, 2021 transesophageal echo Left ventricular function severely reduced, 25 to 30% Global hypokinesis Moderately dilated left atrium No left atrial appendage thrombus Mild MR Atheromatous plaque of the aorta Small PFO  May 11, 2021 left  and right heart catheterization No significant coronary artery disease  EKG:  The ekg ordered today demonstrates ***   Recent Labs: 05/03/2021: B Natriuretic Peptide 670.0 05/26/2021: TSH 3.322 05/27/2021: ALT 24 05/29/2021: Hemoglobin 10.1; Platelets 390 05/30/2021: Magnesium 2.0 07/09/2021: BUN 18; Creatinine, Ser 1.26; Potassium 3.7; Sodium 134  Recent Lipid Panel    Component Value Date/Time   CHOL 193 05/25/2021 1414   CHOL 202 (H) 02/05/2020 1417   TRIG 102.0 05/25/2021 1414   HDL 47.60 05/25/2021 1414   HDL 46 02/05/2020 1417   CHOLHDL 4 05/25/2021 1414   VLDL 20.4 05/25/2021 1414   LDLCALC 125 (H) 05/25/2021 1414   LDLCALC 139 (H) 02/05/2020 1417    Physical Exam:    VS:  There were no vitals taken for this visit.    Wt Readings from Last 3 Encounters:  08/24/21 271 lb (122.9 kg)  07/09/21 281 lb 9.6 oz (127.7 kg)  07/08/21 279 lb 9.6 oz (126.8 kg)     GEN: *** Well nourished, well developed in no acute distress HEENT: Normal NECK: No JVD; No carotid bruits LYMPHATICS: No lymphadenopathy CARDIAC: ***RRR, no murmurs, rubs, gallops RESPIRATORY:  Clear to auscultation without rales, wheezing or rhonchi  ABDOMEN: Soft, non-tender, non-distended MUSCULOSKELETAL:  No edema; No deformity  SKIN: Warm and dry NEUROLOGIC:  Alert and oriented x 3 PSYCHIATRIC:  Normal affect       ASSESSMENT:    No diagnosis found. PLAN:    In order of problems listed above:   ?  Weight loss ?  Gastric bypass Continue amiodarone while losing weight.  Reassess weight loss in 6 months.      Total time spent with patient today *** minutes. This includes reviewing records, evaluating the patient and coordinating care.  Medication Adjustments/Labs and Tests Ordered: Current medicines are reviewed at length with the patient today.  Concerns regarding medicines are outlined above.  No orders of the defined types were placed in this encounter.  No orders of the defined  types were placed in this encounter.    Signed, Hilton Cork. Quentin Ore, MD, [facility], [facility] 08/24/2021 12:03 PM    Electrophysiology [facility]

## 2021-08-24 NOTE — Progress Notes (Signed)
Name: Christine Oneal  MRN/ DOB: [id number], March 12, 1971    Age/ Sex: 51 y.o., female    PCP: Maximiano Coss, NP   Reason for Endocrinology Evaluation: Hypercalcemia      Date of Initial Endocrinology Evaluation: 08/24/2021     HPI: Christine Oneal is a 51 y.o. female with a past medical history of A. Fib, CHF, OSA. The patient presented for initial endocrinology clinic visit on 08/24/2021 for consultative assistance with her hypercalcemia .   Christine Oneal indicates that she was first diagnosed with hypercalcemia in 2021, with a serum a max calcium of 13.9 mg/dL ( uncorrected) in 05/2021 with elevated PTH 91 pg/mL .   Of note, the pt was hospitalized 05/2021 and pamidronate was given   she has  experienced symptoms of polyuria, polydipsia but no constipation   She denies  use of over the counter calcium (including supplements, Tums, Rolaids, or other calcium containing antacids), lithium, or HCTZ.   She is on vitamin D supplements 400 iu daily     She denies  history of kidney stones, kidney disease, liver disease, granulomatous disease. She denies  osteoporosis or prior fractures. Daily dietary calcium intake: 2 servings . She denies  family history of osteoporosis, parathyroid disease,  Sister with  thyroid disease.    HISTORY:  Past Medical History:  Past Medical History:  Diagnosis Date   Abnormal Pap smear of cervix    Allergy    Anemia    CHF (congestive heart failure) (HCC)    Fibroid    Hypertension    Leukocytosis, unspecified 10/18/2013   Obesity    Plantar fasciitis    Past Surgical History:  Past Surgical History:  Procedure Laterality Date   CARDIOVERSION N/A 05/12/2021   Procedure: CARDIOVERSION;  Surgeon: Jolaine Artist, MD;  Location: [place];  Service: Cardiovascular;  Laterality: N/A;   CARDIOVERSION N/A 05/29/2021   Procedure: CARDIOVERSION;  Surgeon: Jolaine Artist, MD;  Location: [facility] ENDOSCOPY;  Service: Cardiovascular;   Laterality: N/A;   COLPOSCOPY     RIGHT/LEFT HEART CATH AND CORONARY ANGIOGRAPHY N/A 05/11/2021   Procedure: RIGHT/LEFT HEART CATH AND CORONARY ANGIOGRAPHY;  Surgeon: Jolaine Artist, MD;  Location: [place] CV LAB;  Service: Cardiovascular;  Laterality: N/A;   TEE WITHOUT CARDIOVERSION N/A 05/12/2021   Procedure: TRANSESOPHAGEAL ECHOCARDIOGRAM (TEE);  Surgeon: Jolaine Artist, MD;  Location: [facility] ENDOSCOPY;  Service: Cardiovascular;  Laterality: N/A;   TUBAL LIGATION  2001    Social History:  reports that she has never smoked. She has never used smokeless tobacco. She reports that she does not drink alcohol and does not use drugs. Family History: family history includes Cervical cancer in her mother; Diabetes in her sister; Hypertension in her brother, brother, father, and mother; Stroke in her father; Uterine cancer in her maternal grandmother.   HOME MEDICATIONS: Allergies as of 08/24/2021       Reactions   Lisinopril-hydrochlorothiazide Anaphylaxis   Other Anaphylaxis        Medication List        Accurate as of August 24, 2021 12:51 PM. If you have any questions, ask your nurse or doctor.          STOP taking these medications    amoxicillin-clavulanate 875-125 MG tablet Commonly known as: AUGMENTIN Stopped by: Dorita Sciara, MD       TAKE these medications    acetaminophen 500 MG tablet Commonly known as: TYLENOL Take 1,000 mg by  mouth every 6 (six) hours as needed for moderate pain or headache.   amiodarone 200 MG tablet Commonly known as: PACERONE Take 200 mg by mouth daily.   apixaban 5 MG Tabs tablet Commonly known as: ELIQUIS Take 1 tablet (5 mg total) by mouth 2 (two) times daily.   azelastine 0.1 % nasal spray Commonly known as: ASTELIN Place 1 spray into both nostrils 2 (two) times daily. Use in each nostril as directed   carvedilol 6.25 MG tablet Commonly known as: COREG Take 1 tablet (6.25 mg total) by mouth 2 (two) times  daily.   cholecalciferol 10 MCG (400 UNIT) Tabs tablet Commonly known as: VITAMIN D3 Take 1 tablet (400 Units total) by mouth daily.   dapagliflozin propanediol 10 MG Tabs tablet Commonly known as: FARXIGA Take 1 tablet (10 mg total) by mouth daily.   dextromethorphan-guaiFENesin 30-600 MG 12hr tablet Commonly known as: MUCINEX DM Take 1 tablet by mouth 2 (two) times daily.   hydrALAZINE 25 MG tablet Commonly known as: APRESOLINE Take 1 tablet (25 mg total) by mouth 3 (three) times daily.   isosorbide mononitrate 30 MG 24 hr tablet Commonly known as: IMDUR Take 1 tablet (30 mg total) by mouth daily.   losartan 50 MG tablet Commonly known as: COZAAR Take 1 tablet (50 mg total) by mouth 2 (two) times daily.   potassium chloride SA 20 MEQ tablet Commonly known as: KLOR-CON M Take 1 tablet (20 mEq total) by mouth as needed (Take when taking Torsemide).   Probiotic Chew Chew 2 capsules by mouth daily.   spironolactone 25 MG tablet Commonly known as: ALDACTONE TAKE 1 TABLET (25 MG TOTAL) BY MOUTH DAILY.   torsemide 20 MG tablet Commonly known as: Demadex Take 1 tablet (20 mg total) by mouth as needed (when weight is up or notes swelling).          REVIEW OF SYSTEMS: A comprehensive ROS was conducted with the patient and is negative except as per HPI     OBJECTIVE:  VS: BP 126/80 (BP Location: Left Arm, Patient Position: Sitting, Cuff Size: Large)    Pulse 73    Ht 5\' 3"  (1.6 m)    Wt 271 lb (122.9 kg)    SpO2 94%    BMI 48.01 kg/m    Wt Readings from Last 3 Encounters:  08/24/21 271 lb (122.9 kg)  07/09/21 281 lb 9.6 oz (127.7 kg)  07/08/21 279 lb 9.6 oz (126.8 kg)     EXAM: General: Pt appears well and is in NAD  Eyes: External eye exam normal without stare, lid lag or exophthalmos.  EOM intact.  PERRL.  Neck: General: Supple without adenopathy. Thyroid: Thyroid size normal.  No goiter or nodules appreciated. No thyroid bruit.  Lungs: Clear with good BS  bilat with no rales, rhonchi, or wheezes  Heart: Auscultation: RRR.  Abdomen: Normoactive bowel sounds, soft, nontender, without masses or organomegaly palpable  Extremities:  BL LE: No pretibial edema normal ROM and strength.  Mental Status: Judgment, insight: Intact Orientation: Oriented to time, place, and person Mood and affect: No depression, anxiety, or agitation     DATA REVIEWED:   Latest Reference Range & Units 07/09/21 09:42  Sodium 135 - 145 mmol/L 134 (L)  Potassium 3.5 - 5.1 mmol/L 3.7  Chloride 98 - 111 mmol/L 103  CO2 22 - 32 mmol/L 24  Glucose 70 - 99 mg/dL 105 (H)  BUN 6 - 20 mg/dL 18  Creatinine 0.44 - 1.00  mg/dL 1.26 (H)  Calcium 8.9 - 10.3 mg/dL 10.4 (H)  Anion gap 5 - 15  7      Latest Reference Range & Units 05/28/21 09:45  Calcium, 24 hour urine 0 - 320 mg/24 hr 419 (H)     Thyroid Ultrasound 05/28/2021   Estimated total number of nodules >/= 1 cm: 0   Number of spongiform nodules >/=  2 cm not described below (TR1): 0   Number of mixed cystic and solid nodules >/= 1.5 cm not described below (TR2): 0   _________________________________________________________   Subcentimeter hypoechoic nodule in the posterior aspect the right thyroid lobe does not meet criteria for further dedicated follow-up or biopsy. No other discrete nodules are seen within the thyroid gland.   Posterior to the left thyroid lobe, there is a hypoechoic ovoid lesion the measures up to 2.1 x 0.8 x 1.0 cm.   IMPRESSION: 1. Normal sonographic appearance of the thyroid gland. 2. Posterior to the left thyroid lobe, there is a hypoechoic ovoid lesion that measures up to approximately 2 cm in size. Differential considerations include enlarged parathyroid gland versus lymph node.    ASSESSMENT/PLAN/RECOMMENDATIONS:   Primary hyperparathyroidism:  -Patient is status post pamidronate in November 2022 for severe hypercalcemia up to 13.9 mg/DL -Patient needs further  evaluation to determine surgical candidacy -24-hour urine calcium excretion elevated at 419 mg -We will proceed with DXA -We will also proceed with KUB for nephrocalcinosis or nephrolithiasis     Recommendations - Encouraged hydration  - AVOID CALCIUM SUPPLEMENTS, AVOID LOW CALCIUM DIET - Maintain normal dietary calcium intake (2-3 servings of dairy a day)   Patient return for labs as we do not have a phlebotomist today  Follow-up in 3 months  Signed electronically by: Mack Guise, MD  [facility] Endocrinology  [facility] [address] [place], [place] [address] Phone: [phone] FAX: [phone]   CC: Maximiano Coss, NP [address] [place] [address] Phone: [phone] Fax: [phone]   Return to Endocrinology clinic as below: Future Appointments  Date Time Provider [place]  08/25/2021 10:00 AM Vickie Epley, MD [facility]  08/27/2021  9:00 AM [facility] DEXA 1 [facility]  09/09/2021  9:00 AM [facility] ECHO OP 1 [facility]  09/09/2021 10:00 AM Bensimhon, Shaune Pascal, MD [facility] None  11/24/2021  9:50 AM [place] Wine[place], Melanie Crazier, MD [facility] None

## 2021-08-24 NOTE — Patient Instructions (Signed)
-   Stay Hydrated  - Avoid over the counter calcium tablet  - Consume 2-3 servings of calcium daily ( low fat dairy, green leafy vegetables )    

## 2021-08-25 ENCOUNTER — Encounter: Payer: Self-pay | Admitting: Cardiology

## 2021-08-25 ENCOUNTER — Ambulatory Visit: Payer: 59 | Admitting: Cardiology

## 2021-08-25 VITALS — BP 122/60 | HR 63 | Ht 63.0 in | Wt 273.0 lb

## 2021-08-25 DIAGNOSIS — I10 Essential (primary) hypertension: Secondary | ICD-10-CM

## 2021-08-25 DIAGNOSIS — I4819 Other persistent atrial fibrillation: Secondary | ICD-10-CM | POA: Diagnosis not present

## 2021-08-25 DIAGNOSIS — I5022 Chronic systolic (congestive) heart failure: Secondary | ICD-10-CM

## 2021-08-25 LAB — TSH: TSH: 2.05 u[IU]/mL (ref 0.450–4.500)

## 2021-08-25 LAB — COMPREHENSIVE METABOLIC PANEL WITH GFR
ALT: 17 [IU]/L (ref 0–32)
AST: 19 [IU]/L (ref 0–40)
Albumin/Globulin Ratio: 1.3 (ref 1.2–2.2)
Albumin: 4.1 g/dL (ref 3.8–4.9)
Alkaline Phosphatase: 110 [IU]/L (ref 44–121)
BUN/Creatinine Ratio: 16 (ref 9–23)
BUN: 24 mg/dL (ref 6–24)
Bilirubin Total: 0.9 mg/dL (ref 0.0–1.2)
CO2: 21 mmol/L (ref 20–29)
Calcium: 11.1 mg/dL — ABNORMAL HIGH (ref 8.7–10.2)
Chloride: 104 mmol/L (ref 96–106)
Creatinine, Ser: 1.49 mg/dL — ABNORMAL HIGH (ref 0.57–1.00)
Globulin, Total: 3.1 g/dL (ref 1.5–4.5)
Glucose: 92 mg/dL (ref 70–99)
Potassium: 4.2 mmol/L (ref 3.5–5.2)
Sodium: 137 mmol/L (ref 134–144)
Total Protein: 7.2 g/dL (ref 6.0–8.5)
eGFR: 42 mL/min/{1.73_m2} — ABNORMAL LOW

## 2021-08-25 LAB — T4, FREE: Free T4: 1.86 ng/dL — ABNORMAL HIGH (ref 0.82–1.77)

## 2021-08-25 NOTE — Patient Instructions (Addendum)
Medication Instructions:  Your physician recommends that you continue on your current medications as directed. Please refer to the Current Medication list given to you today. *If you need a refill on your cardiac medications before your next appointment, please call your pharmacy*  Lab Work: None. If you have labs (blood work) drawn today and your tests are completely normal, you will receive your results only by: [place] (if you have MyChart) OR A paper copy in the mail If you have any lab test that is abnormal or we need to change your treatment, we will call you to review the results.  Testing/Procedures: CMP, TSH, Free T4  Follow-Up: At [facility], you and your health needs are our priority.  As part of our continuing mission to provide you with exceptional heart care, we have created designated Provider Care Teams.  These Care Teams include your primary Cardiologist (physician) and Advanced Practice Providers (APPs -  Physician Assistants and Nurse Practitioners) who all work together to provide you with the care you need, when you need it.  Your physician wants you to follow-up in: 4 months with  one of the following Advanced Practice Providers on your designated Care Team:    Tommye Standard, [place] Legrand Como "Jonni Sanger" [place], [place]  We recommend signing up for the patient portal called "MyChart".  Sign up information is provided on this After Visit Summary.  MyChart is used to connect with patients for Virtual Visits (Telemedicine).  Patients are able to view lab/test results, encounter notes, upcoming appointments, etc.  Non-urgent messages can be sent to your provider as well.   To learn more about what you can do with MyChart, go to [email or web address].    Any Other Special Instructions Will Be Listed Below (If Applicable).

## 2021-08-25 NOTE — Progress Notes (Signed)
Electrophysiology Office Note:    Date:  08/25/2021   ID:  Christine Oneal, DOB 03/02/1971, MRN [id number]  PCP:  Maximiano Coss, NP  [facility] Cardiologist:  None  [facility] Electrophysiologist:  None   Referring MD: Sherran Needs, NP   Chief Complaint: Atrial fibrillation  History of Present Illness:    Christine Oneal is a 51 y.o. female who presents for an evaluation of atrial fibrillation at the request of Roderic Palau. Their medical history includes hypertension, chronic systolic heart failure.  Her cardiomyopathy was felt to be related to tachycardia/atrial arrhythmias so she was started on amiodarone and underwent a cardioversion.  She follows with Dr. Haroldine Laws in the heart failure clinic for her chronic systolic heart failure.  Overall, she is feeling okay. Every once in a blue moon she will feel palpitations.  Since 05/04/21 at her ED visit her amiodarone was decreased to one tablet daily. At this time she denies any side effects.  Her peak weight was 319 lbs on 10/24. She has been trying to lose weight with regular walking and being more conscientious of her diet.  She denies any chest pain, shortness of breath, or peripheral edema. No lightheadedness, headaches, syncope, orthopnea, snoring, or PND.      Past Medical History:  Diagnosis Date   Abnormal Pap smear of cervix    Allergy    Anemia    CHF (congestive heart failure) (HCC)    Fibroid    Hypertension    Leukocytosis, unspecified 10/18/2013   Obesity    Plantar fasciitis     Past Surgical History:  Procedure Laterality Date   CARDIOVERSION N/A 05/12/2021   Procedure: CARDIOVERSION;  Surgeon: Jolaine Artist, MD;  Location: [facility] ENDOSCOPY;  Service: Cardiovascular;  Laterality: N/A;   CARDIOVERSION N/A 05/29/2021   Procedure: CARDIOVERSION;  Surgeon: Jolaine Artist, MD;  Location: [facility] ENDOSCOPY;  Service: Cardiovascular;  Laterality: N/A;   COLPOSCOPY     RIGHT/LEFT HEART CATH AND  CORONARY ANGIOGRAPHY N/A 05/11/2021   Procedure: RIGHT/LEFT HEART CATH AND CORONARY ANGIOGRAPHY;  Surgeon: Jolaine Artist, MD;  Location: [place] CV LAB;  Service: Cardiovascular;  Laterality: N/A;   TEE WITHOUT CARDIOVERSION N/A 05/12/2021   Procedure: TRANSESOPHAGEAL ECHOCARDIOGRAM (TEE);  Surgeon: Jolaine Artist, MD;  Location: [facility] ENDOSCOPY;  Service: Cardiovascular;  Laterality: N/A;   TUBAL LIGATION  2001    Current Medications: Current Meds  Medication Sig   acetaminophen (TYLENOL) 500 MG tablet Take 1,000 mg by mouth every 6 (six) hours as needed for moderate pain or headache.   amiodarone (PACERONE) 200 MG tablet Take 200 mg by mouth daily.   apixaban (ELIQUIS) 5 MG TABS tablet Take 1 tablet (5 mg total) by mouth 2 (two) times daily.   carvedilol (COREG) 6.25 MG tablet Take 1 tablet (6.25 mg total) by mouth 2 (two) times daily.   cholecalciferol (VITAMIN D3) 10 MCG (400 UNIT) TABS tablet Take 1 tablet (400 Units total) by mouth daily.   dapagliflozin propanediol (FARXIGA) 10 MG TABS tablet Take 1 tablet (10 mg total) by mouth daily.   hydrALAZINE (APRESOLINE) 25 MG tablet Take 1 tablet (25 mg total) by mouth 3 (three) times daily.   isosorbide mononitrate (IMDUR) 30 MG 24 hr tablet Take 1 tablet (30 mg total) by mouth daily.   losartan (COZAAR) 50 MG tablet Take 1 tablet (50 mg total) by mouth 2 (two) times daily.   potassium chloride SA (KLOR-CON M) 20 MEQ tablet Take  1 tablet (20 mEq total) by mouth as needed (Take when taking Torsemide).   Probiotic CHEW Chew 2 capsules by mouth daily.   spironolactone (ALDACTONE) 25 MG tablet TAKE 1 TABLET (25 MG TOTAL) BY MOUTH DAILY.   torsemide (DEMADEX) 20 MG tablet Take 1 tablet (20 mg total) by mouth as needed (when weight is up or notes swelling).     Allergies:   Lisinopril-hydrochlorothiazide and Other   Social History   Socioeconomic History   Marital status: Divorced    Spouse name: Not on file   Number of children:  4   Years of education: Not on file   Highest education level: Not on file  Occupational History   Occupation: CNA    Employer: Information systems manager   Tobacco Use   Smoking status: Never   Smokeless tobacco: Never  Vaping Use   Vaping Use: Never used  Substance and Sexual Activity   Alcohol use: No   Drug use: No   Sexual activity: Yes    Partners: Male    Birth control/protection: Surgical    Comment: BTL  Other Topics Concern   Not on file  Social History Narrative   Morning has 4 children. Three live at home with her.   Social Determinants of Health   Financial Resource Strain: Not on file  Food Insecurity: No Food Insecurity   Worried About Charity fundraiser in the Last Year: Never true   Ran Out of Food in the Last Year: Never true  Transportation Needs: No Transportation Needs   Lack of Transportation (Medical): No   Lack of Transportation (Non-Medical): No  Physical Activity: Not on file  Stress: Not on file  Social Connections: Not on file     Family History: The patient's family history includes Cervical cancer in her mother; Diabetes in her sister; Hypertension in her brother, brother, father, and mother; Stroke in her father; Uterine cancer in her maternal grandmother.  ROS:   Please see the history of present illness.    (+) Rare palpitations All other systems reviewed and are negative.  EKGs/Labs/Other Studies Reviewed:    The following studies were reviewed today:  May 12, 2021 transesophageal echo Left ventricular function severely reduced, 25 to 30% Global hypokinesis Moderately dilated left atrium No left atrial appendage thrombus Mild MR Atheromatous plaque of the aorta Small PFO  May 11, 2021 left and right heart catheterization No significant coronary artery disease  EKG:  EKG is personally reviewed. 08/25/2021: Sinus rhythm, PAC, PVC   Recent Labs: 05/03/2021: B Natriuretic Peptide 670.0 05/26/2021: TSH 3.322 05/27/2021: ALT  24 05/29/2021: Hemoglobin 10.1; Platelets 390 05/30/2021: Magnesium 2.0 07/09/2021: BUN 18; Creatinine, Ser 1.26; Potassium 3.7; Sodium 134   Recent Lipid Panel    Component Value Date/Time   CHOL 193 05/25/2021 1414   CHOL 202 (H) 02/05/2020 1417   TRIG 102.0 05/25/2021 1414   HDL 47.60 05/25/2021 1414   HDL 46 02/05/2020 1417   CHOLHDL 4 05/25/2021 1414   VLDL 20.4 05/25/2021 1414   LDLCALC 125 (H) 05/25/2021 1414   LDLCALC 139 (H) 02/05/2020 1417    Physical Exam:    VS:  BP 122/60    Pulse 63    Ht 5\' 3"  (1.6 m)    Wt 273 lb (123.8 kg)    SpO2 99%    BMI 48.36 kg/m     Wt Readings from Last 3 Encounters:  08/25/21 273 lb (123.8 kg)  08/24/21 271 lb (122.9 kg)  07/09/21 281 lb 9.6 oz (127.7 kg)     GEN: Well nourished, well developed in no acute distress.  Morbidly obese HEENT: Normal NECK: No JVD; No carotid bruits LYMPHATICS: No lymphadenopathy CARDIAC: RRR, no murmurs, rubs, gallops RESPIRATORY:  Clear to auscultation without rales, wheezing or rhonchi  ABDOMEN: Soft, non-tender, non-distended MUSCULOSKELETAL:  No edema; No deformity  SKIN: Warm and dry NEUROLOGIC:  Alert and oriented x 3 PSYCHIATRIC:  Normal affect       ASSESSMENT:    1. Persistent atrial fibrillation ([place])   2. Chronic systolic CHF (congestive heart failure) ([place])   3. Primary hypertension   4. Morbid obesity ([place])    PLAN:    In order of problems listed above:  #Persistent atrial fibrillation Symptomatic.  In the setting of her chronic systolic heart failure, a rhythm control strategy is indicated.  We had a long discussion about the management options for her atrial fibrillation.  For now, I would recommend continuing amiodarone.  While she is on amiodarone, I would focus her efforts on weight loss so that she would become eligible for an A-fib ablation.  Ideally, we would get her weight down closer to to a BMI less than 40 to maximize her chances of a successful A-fib ablation.  I  discussed referral to a weight loss clinic here at [facility] and she is interested.  I will put this in today.  We will have her follow-up with an APP in about 4 months.  If she is making progress with her weight loss, would plan to schedule an appointment with her to discuss catheter ablation.  We will get blood work today for amiodarone monitoring.  #Chronic systolic heart failure NYHA class II.  Warm and dry on exam.  Continue current medicines.  Rhythm control as above.  #Hypertension Controlled.  Continue current medicines  Follow-up with APP in 4 months.  Will need repeat blood work at that appointment.    Medication Adjustments/Labs and Tests Ordered: Current medicines are reviewed at length with the patient today.  Concerns regarding medicines are outlined above.   Orders Placed This Encounter  Procedures   EKG 12-Lead   No orders of the defined types were placed in this encounter.  I,Mathew Stumpf,acting as a Education administrator for Vickie Epley, MD.,have documented all relevant documentation on the behalf of Vickie Epley, MD,as directed by  Vickie Epley, MD while in the presence of Vickie Epley, MD.  I, Vickie Epley, MD, have reviewed all documentation for this visit. The documentation on 08/25/21 for the exam, diagnosis, procedures, and orders are all accurate and complete.   Signed, Hilton Cork. Quentin Ore, MD, [facility] 08/25/2021 11:18 AM    Electrophysiology [facility]

## 2021-08-27 ENCOUNTER — Inpatient Hospital Stay: Admission: RE | Admit: 2021-08-27 | Payer: 59 | Source: Ambulatory Visit

## 2021-08-27 NOTE — Telephone Encounter (Signed)
PA received from [employer]. Auth # [id number]. Valid dates 08/12/21 to 11/09/21. Appointment scheduled and patient notified of details.

## 2021-08-28 ENCOUNTER — Other Ambulatory Visit (HOSPITAL_COMMUNITY): Payer: Self-pay | Admitting: *Deleted

## 2021-08-28 DIAGNOSIS — I5022 Chronic systolic (congestive) heart failure: Secondary | ICD-10-CM

## 2021-08-31 ENCOUNTER — Telehealth: Payer: Self-pay

## 2021-08-31 ENCOUNTER — Other Ambulatory Visit: Payer: Self-pay

## 2021-08-31 ENCOUNTER — Ambulatory Visit (INDEPENDENT_AMBULATORY_CARE_PROVIDER_SITE_OTHER)
Admission: RE | Admit: 2021-08-31 | Discharge: 2021-08-31 | Disposition: A | Discharge status: Home / Self Care | Payer: 59 | Source: Ambulatory Visit | Attending: Internal Medicine | Admitting: Internal Medicine

## 2021-08-31 DIAGNOSIS — E210 Primary hyperparathyroidism: Secondary | ICD-10-CM | POA: Diagnosis not present

## 2021-08-31 NOTE — Telephone Encounter (Signed)
Patient will contact her insurance to find out which imaging place she can use and will give [place] a call back so we can send order.

## 2021-09-07 ENCOUNTER — Other Ambulatory Visit: Payer: Self-pay | Admitting: Registered Nurse

## 2021-09-07 MED ORDER — LOSARTAN POTASSIUM 50 MG PO TABS: 50.0000 mg | ORAL_TABLET | Freq: Two times a day (BID) | ORAL | 3 refills | Status: DC

## 2021-09-07 MED ORDER — HYDRALAZINE HCL 25 MG PO TABS: 25.0000 mg | ORAL_TABLET | Freq: Three times a day (TID) | ORAL | 3 refills | Status: DC

## 2021-09-07 NOTE — Telephone Encounter (Signed)
Patient is requesting a refill of the following medications: Requested Prescriptions   Pending Prescriptions Disp Refills   hydrALAZINE (APRESOLINE) 25 MG tablet 90 tablet 3    Sig: Take 1 tablet (25 mg total) by mouth 3 (three) times daily.   losartan (COZAAR) 50 MG tablet 60 tablet 3    Sig: Take 1 tablet (50 mg total) by mouth 2 (two) times daily.    Date of patient request: 09/07/2021 Last office visit: 06/09/2021 Date of last refill: 06/11/2021 Last refill amount: 60 tablets 3 refills Follow up time period per chart: none

## 2021-09-07 NOTE — Telephone Encounter (Signed)
Pt called in asking for refills on the hydralazine and losartan. Pt uses [facility] on [address].   She also wanted to know when Delfino Lovett wanted her to f/up with him. Didn't see any notes at her last video visit.   Please advise

## 2021-09-08 NOTE — Telephone Encounter (Signed)
Pt called stating that Christine Oneal was going to send her paperwork to the [facility] and she still haven't heard anything, she states that she is doing a follow up on that and also could be reach at [phone]  Please advice  Thank You

## 2021-09-09 ENCOUNTER — Encounter (HOSPITAL_COMMUNITY): Payer: Self-pay | Admitting: Internal Medicine

## 2021-09-09 ENCOUNTER — Ambulatory Visit (HOSPITAL_BASED_OUTPATIENT_CLINIC_OR_DEPARTMENT_OTHER)
Admission: RE | Admit: 2021-09-09 | Discharge: 2021-09-09 | Disposition: A | Discharge status: Home / Self Care | Payer: 59 | Source: Ambulatory Visit | Attending: Internal Medicine | Admitting: Internal Medicine

## 2021-09-09 ENCOUNTER — Ambulatory Visit (HOSPITAL_COMMUNITY)
Admission: RE | Admit: 2021-09-09 | Discharge: 2021-09-09 | Disposition: A | Discharge status: Home / Self Care | Payer: 59 | Source: Ambulatory Visit | Attending: Internal Medicine | Admitting: Internal Medicine

## 2021-09-09 ENCOUNTER — Other Ambulatory Visit (HOSPITAL_COMMUNITY): Payer: Self-pay

## 2021-09-09 ENCOUNTER — Other Ambulatory Visit: Payer: Self-pay

## 2021-09-09 VITALS — BP 150/94 | HR 65 | Wt 273.0 lb

## 2021-09-09 DIAGNOSIS — I5022 Chronic systolic (congestive) heart failure: Secondary | ICD-10-CM | POA: Diagnosis not present

## 2021-09-09 DIAGNOSIS — Z79899 Other long term (current) drug therapy: Secondary | ICD-10-CM | POA: Diagnosis not present

## 2021-09-09 DIAGNOSIS — R0602 Shortness of breath: Secondary | ICD-10-CM | POA: Insufficient documentation

## 2021-09-09 DIAGNOSIS — I110 Hypertensive heart disease with heart failure: Secondary | ICD-10-CM | POA: Insufficient documentation

## 2021-09-09 DIAGNOSIS — E669 Obesity, unspecified: Secondary | ICD-10-CM | POA: Diagnosis not present

## 2021-09-09 DIAGNOSIS — Z7901 Long term (current) use of anticoagulants: Secondary | ICD-10-CM | POA: Insufficient documentation

## 2021-09-09 DIAGNOSIS — I4891 Unspecified atrial fibrillation: Secondary | ICD-10-CM | POA: Insufficient documentation

## 2021-09-09 DIAGNOSIS — Z7984 Long term (current) use of oral hypoglycemic drugs: Secondary | ICD-10-CM | POA: Insufficient documentation

## 2021-09-09 DIAGNOSIS — I4819 Other persistent atrial fibrillation: Secondary | ICD-10-CM

## 2021-09-09 DIAGNOSIS — I10 Essential (primary) hypertension: Secondary | ICD-10-CM | POA: Diagnosis not present

## 2021-09-09 DIAGNOSIS — I428 Other cardiomyopathies: Secondary | ICD-10-CM | POA: Diagnosis not present

## 2021-09-09 DIAGNOSIS — R0683 Snoring: Secondary | ICD-10-CM | POA: Diagnosis not present

## 2021-09-09 DIAGNOSIS — Z6841 Body Mass Index (BMI) 40.0 and over, adult: Secondary | ICD-10-CM | POA: Diagnosis not present

## 2021-09-09 LAB — COMPREHENSIVE METABOLIC PANEL
ALT: 25 U/L (ref 0–44)
AST: 22 U/L (ref 15–41)
Albumin: 3.9 g/dL (ref 3.5–5.0)
Alkaline Phosphatase: 89 U/L (ref 38–126)
Anion gap: 8 (ref 5–15)
BUN: 18 mg/dL (ref 6–20)
CO2: 25 mmol/L (ref 22–32)
Calcium: 10.5 mg/dL — ABNORMAL HIGH (ref 8.9–10.3)
Chloride: 108 mmol/L (ref 98–111)
Creatinine, Ser: 1.5 mg/dL — ABNORMAL HIGH (ref 0.44–1.00)
GFR, Estimated: 42 mL/min — ABNORMAL LOW (ref >60)
Glucose, Bld: 96 mg/dL (ref 70–99)
Potassium: 4.4 mmol/L (ref 3.5–5.1)
Sodium: 141 mmol/L (ref 135–145)
Total Bilirubin: 1.1 mg/dL (ref 0.3–1.2)
Total Protein: 7.8 g/dL (ref 6.5–8.1)

## 2021-09-09 LAB — ECHOCARDIOGRAM COMPLETE
Area-P 1/2: 2.96 cm2
Calc EF: 54.3 %
S' Lateral: 3.2 cm
Single Plane A2C EF: 50.9 %
Single Plane A4C EF: 55.8 %

## 2021-09-09 LAB — BRAIN NATRIURETIC PEPTIDE: B Natriuretic Peptide: 30.6 pg/mL (ref 0.0–100.0)

## 2021-09-09 MED ORDER — LOSARTAN POTASSIUM 50 MG PO TABS: 50.0000 mg | ORAL_TABLET | Freq: Two times a day (BID) | ORAL | 3 refills | Status: DC

## 2021-09-09 MED ORDER — HYDRALAZINE HCL 50 MG PO TABS: 50.0000 mg | ORAL_TABLET | Freq: Three times a day (TID) | ORAL | 6 refills | Status: DC

## 2021-09-09 NOTE — Patient Instructions (Addendum)
Continue Losartan 50 mg Twice daily  ? ?Increase Hydralazine to 50 mg Three times a day  ? ?Labs done today, your results will be available in MyChart, we will contact you for abnormal readings. ? ?Your physician recommends that you schedule a follow-up appointment in: 4 months ? ?If you have any questions or concerns before your next appointment please send [place] a message through [place] or call our office at [phone].   ? ?TO LEAVE A MESSAGE FOR THE NURSE SELECT OPTION 2, PLEASE LEAVE A MESSAGE INCLUDING: ?YOUR NAME ?DATE OF BIRTH ?CALL BACK NUMBER ?REASON FOR CALL**this is important as we prioritize the call backs ? ?YOU WILL RECEIVE A CALL BACK THE SAME DAY AS LONG AS YOU CALL BEFORE 4:00 PM ? ?At the [facility], you and your health needs are our priority. As part of our continuing mission to provide you with exceptional heart care, we have created designated Provider Care Teams. These Care Teams include your primary Cardiologist (physician) and Advanced Practice Providers (APPs- Physician Assistants and Nurse Practitioners) who all work together to provide you with the care you need, when you need it.  ? ?You may see any of the following providers on your designated Care Team at your next follow up: ?Dr Glori Bickers ?Dr Loralie Champagne ?Darrick Grinder, NP ?Lyda Jester, PA ?Jessica Milford,NP ?Marlyce Huge, PA ?Audry Riles, PharmD ? ? ?Please be sure to bring in all your medications bottles to every appointment.  ? ? ?

## 2021-09-09 NOTE — Progress Notes (Addendum)
ADVANCED HF CLINIC NOTE  PCP: Maximiano Coss, NP Primary Cardiologist: Dr. Haroldine Laws  HPI: Christine Oneal is a 51 y.o. 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   Admitted from 05/03/21- 05/14/21 w/ new atrial fibrillation and CHF. She was admitted under Cardiology service and AHF consulted. D/t concern for low-output HF she was started on IV milrinone. Echo with LVEF < 20%, moderate LVH, moderately reduced RV, severe MR. 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 R/LHC 10/31 with normal coronary arteries, RA 2, PA 31/8, Fick CI 2.5, PA sat 67%. Diuresed with IV lasix then transitioned to po torsemide, weight down total of 31 lb. D/C weight 287 lb.  Initiated GDMT with spiro, losartan, imdur/hydralazine (no bidil d/t cost), farxiga and digoxin.     Unsuccessful DCCV x 3 shocks 11/22. IV amio switched to po 200 mg bid. Anticoagulated with Eliquis.    Pt had post hospital f/u w/ her PCP on 11/14 and complained of constipation and palpitations. Labs showed severe hypercalcemia and AKI secondary to overdiuresis as well as multiple medications.  Ca 13.9. SCr 1.65 (baseline 0.9). Given IV hydration and IV pamidronate. Referred to the ED and was admitted under hospitalist service. Scr gradually improved to 1.20. Restarted on spiro, losartan and torsemide prior to discharge. Remained in AF with RVR on admit, underwernt DCCV on 05/29/21 with brief return to NSR then back to AF. Back in NSR on day of discharge.  Today she returns for HF follow up. Doing well. Able to do all ADLs and go to store without problem. If she goes up steps knees hurt and mild SOB. No CP. Saw Dr. Quentin Ore 08/25/21. Felt not great candidate for ablation due to BMI. Recommended continue amio. Would reconsider ablation if BMI < 40.   Echo today 09/09/21 EF 50-55% Personally reviewed   Cardiac Studies: - Echo (10/22): EF < 20%, RV  moderately down, severe MR, mild to mod TR - TEE in (11/22): EF 25-30%, normal RV, mild MR.    - RHC (10/22):  Findings: Ao = 156/89 (112) LV =135/7 RA = 2 RV = 27/6 PA = 31/8 (20) PCW = 7 Fick cardiac output/index = 5.6/2.5 PVR = 2.2 WU FA sat = 99% PA sat = 67%. 69% SVC sat = 72%   Assessment: 1. Normal coronary arteries 2. 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 Well-compensated hemodynamics after large-volume diuresis  ROS: All systems negative except as listed in HPI, PMH and Problem List.  SH:  Social History   Socioeconomic History   Marital status: Divorced    Spouse name: Not on file   Number of children: 4   Years of education: Not on file   Highest education level: Not on file  Occupational History   Occupation: CNA    Employer: Information systems manager   Tobacco Use   Smoking status: Never   Smokeless tobacco: Never  Vaping Use   Vaping Use: Never used  Substance and Sexual Activity   Alcohol use: No   Drug use: No   Sexual activity: Yes    Partners: Male    Birth control/protection: Surgical    Comment: BTL  Other Topics Concern   Not on file  Social History Narrative   Lillianna has 4 children. Three live at home with her.   Social Determinants of Health   Financial Resource Strain:  Not on file  Food Insecurity: No Food Insecurity   Worried About [place] in the Last Year: Never true   Ran Out of Food in the Last Year: Never true  Transportation Needs: No Transportation Needs   Lack of Transportation (Medical): No   Lack of Transportation (Non-Medical): No  Physical Activity: Not on file  Stress: Not on file  Social Connections: Not on file  Intimate Partner Violence: Not on file    FH:  Family History  Problem Relation Age of Onset   Hypertension Mother    Cervical cancer Mother    Hypertension Father    Stroke Father    Uterine cancer Maternal Grandmother    Diabetes Sister    Hypertension Brother     Hypertension Brother     Past Medical History:  Diagnosis Date   Abnormal Pap smear of cervix    Allergy    Anemia    CHF (congestive heart failure) (HCC)    Fibroid    Hypertension    Leukocytosis, unspecified 10/18/2013   Obesity    Plantar fasciitis     Current Outpatient Medications  Medication Sig Dispense Refill   acetaminophen (TYLENOL) 500 MG tablet Take 1,000 mg by mouth every 6 (six) hours as needed for moderate pain or headache.     amiodarone (PACERONE) 200 MG tablet Take 200 mg by mouth daily.     apixaban (ELIQUIS) 5 MG TABS tablet Take 1 tablet (5 mg total) by mouth 2 (two) times daily. 60 tablet 3   azelastine (ASTELIN) 0.1 % nasal spray Place 1 spray into both nostrils 2 (two) times daily. Use in each nostril as directed 30 mL 12   carvedilol (COREG) 6.25 MG tablet Take 1 tablet (6.25 mg total) by mouth 2 (two) times daily. 90 tablet 6   cholecalciferol (VITAMIN D3) 10 MCG (400 UNIT) TABS tablet Take 1 tablet (400 Units total) by mouth daily. 30 tablet 0   dapagliflozin propanediol (FARXIGA) 10 MG TABS tablet Take 1 tablet (10 mg total) by mouth daily. 30 tablet 3   dextromethorphan-guaiFENesin (MUCINEX DM) 30-600 MG 12hr tablet Take 1 tablet by mouth 2 (two) times daily as needed for cough.     hydrALAZINE (APRESOLINE) 25 MG tablet Take 1 tablet (25 mg total) by mouth 3 (three) times daily. 90 tablet 3   isosorbide mononitrate (IMDUR) 30 MG 24 hr tablet Take 1 tablet (30 mg total) by mouth daily. 30 tablet 3   losartan (COZAAR) 50 MG tablet Take 1 tablet (50 mg total) by mouth 2 (two) times daily. 60 tablet 3   potassium chloride SA (KLOR-CON M) 20 MEQ tablet Take 1 tablet (20 mEq total) by mouth as needed (Take when taking Torsemide). 30 tablet 3   Probiotic CHEW Chew 2 capsules by mouth daily.     spironolactone (ALDACTONE) 25 MG tablet TAKE 1 TABLET (25 MG TOTAL) BY MOUTH DAILY. 90 tablet 1   torsemide (DEMADEX) 20 MG tablet Take 1 tablet (20 mg total) by mouth as  needed (when weight is up or notes swelling). 30 tablet 3   No current facility-administered medications for this encounter.   BP (!) 150/94    Pulse 65    Wt 123.8 kg (273 lb)    SpO2 100%    BMI 48.36 kg/m   Wt Readings from Last 3 Encounters:  09/09/21 123.8 kg (273 lb)  08/25/21 123.8 kg (273 lb)  08/24/21 122.9 kg (271 lb)  PHYSICAL EXAM: General:  Well appearing. No resp difficulty HEENT: normal Neck: supple. no JVD. Carotids 2+ bilat; no bruits. No lymphadenopathy or thryomegaly appreciated. Cor: PMI nondisplaced. Regular rate & rhythm. No rubs, gallops or murmurs. Lungs: clear Abdomen: obesity, soft, nontender, nondistended. No hepatosplenomegaly. No bruits or masses. Good bowel sounds. Extremities: no cyanosis, clubbing, rash, edema Neuro: alert & orientedx3, cranial nerves grossly intact. moves all 4 extremities w/o difficulty. Affect pleasant   ECG: NSR 60 Personally reviewed   ASSESSMENT & PLAN:   1. Chronic systolic CHF with recovered EF:  - Nonischemic CMP,  tachy-mediated.   - Echo (10/22): EF < 20%, RV moderately down, severe MR, mild to mod TR - R/LHC (05/11/21): normal cors, RA 2, PA 31/8, Fick CI 2.5, PA sat 67% - TEE in (11/22): EF 25-30%, normal RV, mild MR.   - Echo today 09/09/21 EF 50-55% Personally reviewed - NYHA II. Volume looks good today. - Continue torsemide 20 mg daily PRN. - BP up. Increase hydralazine to 50 mg tid + Imdur 30 mg daily. - Continue Farxiga 10 mg daily. - Continue spiro 25 mg daily. - Continue losartan 50 mg bid (had angioedema with Entresto) - Continue carvedilol 6.25 mg bid.  2. Atrial fibrillation with RVR/AFL:  - Failed DCCV 10/22.  - Underwent attempted DC-CV 05/29/21 Brief return to NSR then back to AF.  - Converted with amio 06/01/21 - Saw Dr. Quentin Ore 08/25/21. Felt not great candidate for ablation due to BMI. Recommended continue amio. Would reconsider ablation if BMI < 40.  - Remains in NSR today by ECG - CHA2DS2VASc  score of 3. - Continue Eliquis.   - Continue amiodarone 200 mg daily for now (given young age would like to avoid long-term exposure to [facility], if possible). - Saw Dr. Quentin Ore 08/25/21. Felt not great candidate for ablation due to BMI. Recommended continue amio. Would reconsider ablation if BMI < 40.   3. H/o Hypercalcemia:  - Up to 13.9 prompting admission 11/22.  - Received IV fluids and IV pamidronate. - Not on thiazide diuretic. - Resolved on recent labs. - Planning to see endocrine for further work up, given # to Dr. Quin Hoop office to follow up  4. HTN: - BP up. Increase hydralazine - Continue current medications.  5. Snoring - HST looked ok but pending repeat in hospital  6. Obesity - Body mass index is 48.36 kg/m. - Has referral for [facility]  Glori Bickers MD  09/09/21

## 2021-09-09 NOTE — Telephone Encounter (Signed)
Do you have these papers by any chance ? ? ? ?Pt called stating that Christine Oneal was going to send her paperwork to the [facility] and she still haven't heard anything, she states that she is doing a follow up on that and also could be reach at [phone] ?  ?Please advice ?  ?Thank You ?

## 2021-09-10 ENCOUNTER — Encounter: Payer: Self-pay | Admitting: Registered Nurse

## 2021-09-10 NOTE — Telephone Encounter (Signed)
Called patient to let her know paperwork would be faxed  ?

## 2021-09-10 NOTE — Telephone Encounter (Signed)
Paperwork is a denial of FMLA and short term leave. Unfortunately we can't do much with this ? ?Thanks, ? ?Rich

## 2021-09-11 NOTE — Telephone Encounter (Signed)
Can I still apply for social security disability  ?

## 2021-09-15 ENCOUNTER — Other Ambulatory Visit: Payer: Self-pay

## 2021-09-15 ENCOUNTER — Telehealth (INDEPENDENT_AMBULATORY_CARE_PROVIDER_SITE_OTHER): Payer: 59 | Admitting: Registered Nurse

## 2021-09-15 ENCOUNTER — Encounter: Payer: Self-pay | Admitting: Registered Nurse

## 2021-09-15 DIAGNOSIS — N898 Other specified noninflammatory disorders of vagina: Secondary | ICD-10-CM | POA: Diagnosis not present

## 2021-09-15 DIAGNOSIS — L299 Pruritus, unspecified: Secondary | ICD-10-CM | POA: Diagnosis not present

## 2021-09-15 DIAGNOSIS — L290 Pruritus ani: Secondary | ICD-10-CM | POA: Diagnosis not present

## 2021-09-15 DIAGNOSIS — Z8719 Personal history of other diseases of the digestive system: Secondary | ICD-10-CM | POA: Diagnosis not present

## 2021-09-15 MED ORDER — HYDROCORTISONE 2.5 % EX CREA: TOPICAL_CREAM | Freq: Two times a day (BID) | CUTANEOUS | 1 refills | Status: DC

## 2021-09-15 NOTE — Progress Notes (Signed)
? ? ?Telemedicine Encounter- SOAP NOTE Established Patient ? ?This telephone encounter was conducted with the patient's (or proxy's) verbal consent via audio telecommunications: yes/no: Yes ?Patient was instructed to have this encounter in a suitably private space; and to only have persons present to whom they give permission to participate. In addition, patient identity was confirmed by use of name plus two identifiers (DOB and address).  I discussed the limitations, risks, security and privacy concerns of performing an evaluation and management service by telephone and the availability of in person appointments. I also discussed with the patient that there may be a patient responsible charge related to this service. The patient expressed understanding and agreed to proceed. ? ?I spent a total of 14 minutes talking with the patient or their proxy. ? ?Patient at home ?Provider in office ? ?Participants: Christine Ruddy, NP and Christine Oneal ? ?Chief Complaint  ?Patient presents with  ? Hemorrhoids  ?  Patient states she has been having with her hemorrhoids for about a month off and on with some bleeding and pain while making a bowel movements. Patient wanted to know if it was something she could use  ? ? ?Subjective  ? ?Christine Oneal is a 51 y.o. established patient. Telephone visit today for hemorrhoids ? ?HPI ?Anal fissures/hemorrhoids ?Ongoing on and off x 2 mo ?Brbpr when wiping ?She has used preparation H in the past with good effect.  ?Denies melena, abdominal pain, unexpected weight changes, nvd. ? ?Also notes vaginal itching. ?Has been ongoing since  change in detergent ?No other vaginal or pelvic symptoms ?No sti exposure that she's aware of ?No urinary changes. ?No constitutional symptoms. ? ?Patient Active Problem List  ? Diagnosis Date Noted  ? Hypercalcemia 05/26/2021  ? Chronic systolic CHF (congestive heart failure) ([place]) 05/26/2021  ? AKI (acute kidney injury) ([place]) 05/26/2021  ? Acute systolic  (congestive) heart failure ([place]) 05/05/2021  ? Unspecified atrial fibrillation ([place]) 05/04/2021  ? Acute congestive heart failure ([place])   ? Irregular heart beats 03/18/2020  ? Anemia 09/04/2017  ? Morbid obesity with BMI of 50.0-59.9, adult ([place]) 09/03/2017  ? Reactive airway disease 09/03/2017  ? Vitamin D deficiency 09/03/2017  ? Leukocytosis 10/18/2013  ? Essential hypertension 10/24/2007  ? EXTERNAL HEMORRHOIDS 10/24/2007  ? ANAL FISSURE 10/24/2007  ? ? ?Past Medical History:  ?Diagnosis Date  ? Abnormal Pap smear of cervix   ? Allergy   ? Anemia   ? CHF (congestive heart failure) ([place])   ? Fibroid   ? Hypertension   ? Leukocytosis, unspecified 10/18/2013  ? Obesity   ? Plantar fasciitis   ? ? ?Current Outpatient Medications  ?Medication Sig Dispense Refill  ? hydrocortisone 2.5 % cream Apply topically 2 (two) times daily. 30 g 1  ? acetaminophen (TYLENOL) 500 MG tablet Take 1,000 mg by mouth every 6 (six) hours as needed for moderate pain or headache.    ? amiodarone (PACERONE) 200 MG tablet Take 200 mg by mouth daily.    ? apixaban (ELIQUIS) 5 MG TABS tablet Take 1 tablet (5 mg total) by mouth 2 (two) times daily. 60 tablet 3  ? azelastine (ASTELIN) 0.1 % nasal spray Place 1 spray into both nostrils 2 (two) times daily. Use in each nostril as directed 30 mL 12  ? carvedilol (COREG) 6.25 MG tablet Take 1 tablet (6.25 mg total) by mouth 2 (two) times daily. 90 tablet 6  ? cholecalciferol (VITAMIN D3) 10 MCG (400 UNIT) TABS tablet Take  1 tablet (400 Units total) by mouth daily. 30 tablet 0  ? dapagliflozin propanediol (FARXIGA) 10 MG TABS tablet Take 1 tablet (10 mg total) by mouth daily. 30 tablet 3  ? dextromethorphan-guaiFENesin (MUCINEX DM) 30-600 MG 12hr tablet Take 1 tablet by mouth 2 (two) times daily as needed for cough.    ? hydrALAZINE (APRESOLINE) 50 MG tablet Take 1 tablet (50 mg total) by mouth 3 (three) times daily. 90 tablet 6  ? isosorbide mononitrate (IMDUR) 30 MG 24 hr tablet Take 1 tablet (30  mg total) by mouth daily. 30 tablet 3  ? losartan (COZAAR) 50 MG tablet Take 1 tablet (50 mg total) by mouth in the morning and at bedtime. 90 tablet 3  ? potassium chloride SA (KLOR-CON M) 20 MEQ tablet Take 1 tablet (20 mEq total) by mouth as needed (Take when taking Torsemide). 30 tablet 3  ? Probiotic CHEW Chew 2 capsules by mouth daily.    ? spironolactone (ALDACTONE) 25 MG tablet TAKE 1 TABLET (25 MG TOTAL) BY MOUTH DAILY. 90 tablet 1  ? torsemide (DEMADEX) 20 MG tablet Take 1 tablet (20 mg total) by mouth as needed (when weight is up or notes swelling). 30 tablet 3  ? ?No current facility-administered medications for this visit.  ? ? ?Allergies  ?Allergen Reactions  ? Lisinopril-Hydrochlorothiazide Anaphylaxis  ?  Angioedema  ? Other Anaphylaxis  ? ? ?Social History  ? ?Socioeconomic History  ? Marital status: Divorced  ?  Spouse name: Not on file  ? Number of children: 4  ? Years of education: Not on file  ? Highest education level: Not on file  ?Occupational History  ? Occupation: CNA  ?  Employer: [employer]   ?Tobacco Use  ? Smoking status: Never  ? Smokeless tobacco: Never  ?Vaping Use  ? Vaping Use: Never used  ?Substance and Sexual Activity  ? Alcohol use: No  ? Drug use: No  ? Sexual activity: Yes  ?  Partners: Male  ?  Birth control/protection: Surgical  ?  Comment: BTL  ?Other Topics Concern  ? Not on file  ?Social History Narrative  ? Christine Oneal has 4 children. Three live at home with her.  ? ?Social Determinants of Health  ? ?Financial Resource Strain: Not on file  ?Food Insecurity: No Food Insecurity  ? Worried About Charity fundraiser in the Last Year: Never true  ? Ran Out of Food in the Last Year: Never true  ?Transportation Needs: No Transportation Needs  ? Lack of Transportation (Medical): No  ? Lack of Transportation (Non-Medical): No  ?Physical Activity: Not on file  ?Stress: Not on file  ?Social Connections: Not on file  ?Intimate Partner Violence: Not on file  ? ? ?ROS ?Per hpi   ? ?Objective  ? ?Vitals as reported by the patient: ?There were no vitals filed for this visit. ? ?Vestal was seen today for hemorrhoids. ? ?Diagnoses and all orders for this visit: ? ?Rectal itching ?-     hydrocortisone 2.5 % cream; Apply topically 2 (two) times daily. ? ?History of hemorrhoids ?-     hydrocortisone 2.5 % cream; Apply topically 2 (two) times daily. ? ?Vaginal itching ? ? ? ?PLAN ?Hydrocortisone 2.5% topical bid prn ?Discussed constipation relief, avoidance of hemorrhoid triggers ?Return to previous detergent. Ok to use [employer]. If worsening or failing to improve, pt will return to in office visit ?Patient encouraged to call clinic with any questions, comments, or concerns. ? ?I discussed  the assessment and treatment plan with the patient. The patient was provided an opportunity to ask questions and all were answered. The patient agreed with the plan and demonstrated an understanding of the instructions. ?  ?The patient was advised to call back or seek an in-person evaluation if the symptoms worsen or if the condition fails to improve as anticipated. ? ?I provided 14 minutes of non-face-to-face time during this encounter. ? ?Maximiano Coss, NP ? ?

## 2021-09-15 NOTE — Patient Instructions (Addendum)
Christine Oneal - ? ?Great to speak with you ? ?Call if things don't get better ? ?Use hydrocortisone cream twice daily as needed ?Ok to use OTC witch hazel to help ? ?Itching likely related to change in detergent. ?Ok to use monistat - no major risk in this.  ?If no improvement, let me know ? ?Thanks, ? ?Rich  ? ? ? ?If you have lab work done today you will be contacted with your lab results within the next 2 weeks.  If you have not heard from [place] then please contact us. The fastest way to get your results is to register for My Chart. ? ? ?IF you received an x-ray today, you will receive an invoice from [facility]. Please contact [facility] at [phone] with questions or concerns regarding your invoice.  ? ?IF you received labwork today, you will receive an invoice from [facility]. Please contact [facility] at [phone] with questions or concerns regarding your invoice.  ? ?Our billing staff will not be able to assist you with questions regarding bills from these companies. ? ?You will be contacted with the lab results as soon as they are available. The fastest way to get your results is to activate your My Chart account. Instructions are located on the last page of this paperwork. If you have not heard from [place] regarding the results in 2 weeks, please contact this office. ?  ? ? ?

## 2021-09-21 ENCOUNTER — Encounter (HOSPITAL_BASED_OUTPATIENT_CLINIC_OR_DEPARTMENT_OTHER): Payer: 59 | Admitting: Cardiology

## 2021-10-06 ENCOUNTER — Ambulatory Visit: Payer: 59 | Admitting: Registered Nurse

## 2021-10-09 ENCOUNTER — Other Ambulatory Visit: Payer: Self-pay | Admitting: Registered Nurse

## 2021-10-09 DIAGNOSIS — J069 Acute upper respiratory infection, unspecified: Secondary | ICD-10-CM

## 2021-10-12 ENCOUNTER — Other Ambulatory Visit (HOSPITAL_COMMUNITY): Payer: Self-pay | Admitting: Physician Assistant

## 2021-10-12 ENCOUNTER — Other Ambulatory Visit: Payer: Self-pay | Admitting: Registered Nurse

## 2021-10-13 ENCOUNTER — Other Ambulatory Visit (HOSPITAL_COMMUNITY): Payer: Self-pay | Admitting: Physician Assistant

## 2021-10-16 ENCOUNTER — Other Ambulatory Visit (HOSPITAL_COMMUNITY): Payer: Self-pay | Admitting: *Deleted

## 2021-10-16 MED ORDER — APIXABAN 5 MG PO TABS: 5.0000 mg | ORAL_TABLET | Freq: Two times a day (BID) | ORAL | 6 refills | Status: DC

## 2021-10-21 ENCOUNTER — Ambulatory Visit (HOSPITAL_BASED_OUTPATIENT_CLINIC_OR_DEPARTMENT_OTHER): Payer: 59 | Attending: Cardiology | Admitting: Cardiology

## 2021-10-21 DIAGNOSIS — I480 Paroxysmal atrial fibrillation: Secondary | ICD-10-CM | POA: Insufficient documentation

## 2021-10-21 DIAGNOSIS — I5022 Chronic systolic (congestive) heart failure: Secondary | ICD-10-CM | POA: Diagnosis not present

## 2021-10-21 DIAGNOSIS — I110 Hypertensive heart disease with heart failure: Secondary | ICD-10-CM | POA: Insufficient documentation

## 2021-10-21 DIAGNOSIS — R0683 Snoring: Secondary | ICD-10-CM | POA: Insufficient documentation

## 2021-10-21 DIAGNOSIS — I10 Essential (primary) hypertension: Secondary | ICD-10-CM

## 2021-10-21 DIAGNOSIS — I509 Heart failure, unspecified: Secondary | ICD-10-CM

## 2021-10-23 ENCOUNTER — Encounter (HOSPITAL_COMMUNITY): Payer: Self-pay

## 2021-10-23 NOTE — Procedures (Signed)
? ?  Patient Name: Christine Oneal, Christine Oneal ?Study Date: 10/21/2021 ?Gender: Female ?D.O.B: 1970/11/24 ?Age (years): 51 ?Referring Provider: Fransico Him MD, ABSM ?Height (inches): 63 ?Interpreting Physician: Fransico Him MD, ABSM ?Weight (lbs): 263.40 ?RPSGT: Laren Everts ?BMI: 47 ?MRN: [id number] ?Neck Size: 16.00 ? ?CLINICAL INFORMATION ?Sleep Study Type: NPSG ? ?Indication for sleep study: Congestive Heart Failure, Obesity ? ?Epworth Sleepiness Score: 4 ? ?SLEEP STUDY TECHNIQUE ?As per the AASM Manual for the Scoring of Sleep and Associated Events v2.3 (April 2016) with a hypopnea requiring 4% desaturations. ? ?The channels recorded and monitored were frontal, central and occipital EEG, electrooculogram (EOG), submentalis EMG (chin), nasal and oral airflow, thoracic and abdominal wall motion, anterior tibialis EMG, snore microphone, electrocardiogram, and pulse oximetry. ? ?MEDICATIONS ?Medications self-administered by patient taken the night of the study : N/A ? ?SLEEP ARCHITECTURE ?The study was initiated at 10:46:28 PM and ended at 4:47:33 AM. ? ?Sleep onset time was 22.9 minutes and the sleep efficiency was 67.6%. The total sleep time was 244 minutes. ? ?Stage REM latency was 74.5 minutes. ? ?The patient spent 7.0% of the night in stage N1 sleep, 68.9% in stage N2 sleep, 0.0% in stage N3 and 24.2% in REM. ? ?Alpha intrusion was absent. ? ?Supine sleep was 22.07%. ? ?RESPIRATORY PARAMETERS ?The overall apnea/hypopnea index (AHI) was 2.2 per hour. There were 3 total apneas, including 1 obstructive, 2 central and 0 mixed apneas. There were 6 hypopneas and 35 RERAs. ? ?The AHI during Stage REM sleep was 7.1 per hour. ? ?AHI while supine was 3.3 per hour. ? ?The mean oxygen saturation was 92.8%. The minimum SpO2 during sleep was 88.0%. ? ?soft snoring was noted during this study. ? ?CARDIAC DATA ?The 2 lead EKG demonstrated sinus rhythm. The mean heart rate was 59.0 beats per minute. Other EKG findings include: PVCs,  PAF, PACs. ? ?LEG MOVEMENT DATA ?The total PLMS were 0 with a resulting PLMS index of 0.0. Associated arousal with leg movement index was 0.0 . ? ?IMPRESSIONS ?- No significant obstructive sleep apnea occurred during this study (AHI = 2.2/h). ?- No significant central sleep apnea occurred during this study (CAI = 0.5/h). ?- The patient had minimal or no oxygen desaturation during the study (Min O2 = 88.0%) ?- The patient snored with soft snoring volume. ?- PACs, PAF and PVCs were noted during this study. ?- Clinically significant periodic limb movements did not occur during sleep. No significant associated arousals. ? ?DIAGNOSIS ?- Normal study ?- PAF noted during study ? ?RECOMMENDATIONS ?- Avoid alcohol, sedatives and other CNS depressants that may worsen sleep apnea and disrupt normal sleep architecture. ?- Sleep hygiene should be reviewed to assess factors that may improve sleep quality. ?- Weight management and regular exercise should be initiated or continued if appropriate. ? ?[Electronically signed] 10/23/2021 09:18 AM ? ?Fransico Him MD, ABSM ?Diplomate, Tax adviser of Sleep Medicine ?NPI: [id number] ?

## 2021-10-27 ENCOUNTER — Ambulatory Visit: Payer: 59 | Admitting: Registered Nurse

## 2021-10-27 ENCOUNTER — Telehealth: Payer: Self-pay | Admitting: *Deleted

## 2021-10-27 NOTE — Telephone Encounter (Signed)
Called results left message to call back. 

## 2021-10-27 NOTE — Telephone Encounter (Signed)
-----   Message from Lauralee Evener, [place] sent at 10/23/2021  9:35 AM EDT ----- ? ?----- Message ----- ?From: Sueanne Margarita, MD ?Sent: 10/23/2021   9:20 AM EDT ?To: Cv Div Sleep Studies ? ?Please let patient know that sleep study showed no significant sleep apnea.   ?  ? ?

## 2021-10-28 ENCOUNTER — Telehealth: Payer: Self-pay | Admitting: *Deleted

## 2021-10-28 ENCOUNTER — Encounter (HOSPITAL_BASED_OUTPATIENT_CLINIC_OR_DEPARTMENT_OTHER): Payer: 59 | Admitting: Cardiology

## 2021-10-28 NOTE — Telephone Encounter (Signed)
Left message with daughter to ask patient to call back. ?

## 2021-10-28 NOTE — Telephone Encounter (Signed)
-----   Message from Lauralee Evener, [place] sent at 10/23/2021  9:35 AM EDT ----- ? ?----- Message ----- ?From: Sueanne Margarita, MD ?Sent: 10/23/2021   9:20 AM EDT ?To: Cv Div Sleep Studies ? ?Please let patient know that sleep study showed no significant sleep apnea.   ?  ? ?

## 2021-10-28 NOTE — Telephone Encounter (Signed)
Patient called back and received her result. ?

## 2021-10-29 ENCOUNTER — Encounter: Payer: Self-pay | Admitting: Internal Medicine

## 2021-10-29 ENCOUNTER — Telehealth: Payer: Self-pay | Admitting: Registered Nurse

## 2021-10-29 ENCOUNTER — Ambulatory Visit (INDEPENDENT_AMBULATORY_CARE_PROVIDER_SITE_OTHER): Payer: 59 | Admitting: Internal Medicine

## 2021-10-29 DIAGNOSIS — J069 Acute upper respiratory infection, unspecified: Secondary | ICD-10-CM

## 2021-10-29 MED ORDER — FLUTICASONE PROPIONATE 50 MCG/ACT NA SUSP: 2.0000 | Freq: Every day | NASAL | 6 refills | Status: DC

## 2021-10-29 NOTE — Progress Notes (Signed)
? ?  Subjective:  ? ?Patient ID: MINDA FAAS, female    DOB: 1971-05-12, 51 y.o.   MRN: [id number] ? ?HPI ?The patient is a 51 YO female coming in for sick visit.  ? ?Review of Systems  ?Constitutional:  Positive for activity change and appetite change. Negative for chills, fatigue, fever and unexpected weight change.  ?HENT:  Positive for congestion, postnasal drip, rhinorrhea and sinus pressure. Negative for ear discharge, ear pain, sinus pain, sneezing, sore throat, tinnitus, trouble swallowing and voice change.   ?Eyes: Negative.   ?Respiratory:  Positive for cough and shortness of breath. Negative for chest tightness and wheezing.   ?Cardiovascular: Negative.   ?Gastrointestinal: Negative.   ?Musculoskeletal:  Positive for myalgias.  ?Neurological: Negative.   ? ?Objective:  ?Physical Exam ?Constitutional:   ?   Appearance: She is well-developed.  ?HENT:  ?   Head: Normocephalic and atraumatic.  ?   Comments: Oropharynx with redness and clear drainage, nose with swollen turbinates, TMs normal bilaterally.  ?Neck:  ?   Thyroid: No thyromegaly.  ?Cardiovascular:  ?   Rate and Rhythm: Normal rate and regular rhythm.  ?Pulmonary:  ?   Effort: Pulmonary effort is normal. No respiratory distress.  ?   Breath sounds: Normal breath sounds. No wheezing or rales.  ?Abdominal:  ?   General: Bowel sounds are normal. There is no distension.  ?   Palpations: Abdomen is soft.  ?   Tenderness: There is no abdominal tenderness. There is no rebound.  ?Musculoskeletal:     ?   General: Tenderness present.  ?   Cervical back: Normal range of motion.  ?Lymphadenopathy:  ?   Cervical: No cervical adenopathy.  ?Skin: ?   General: Skin is warm and dry.  ?Neurological:  ?   Mental Status: She is alert and oriented to person, place, and time.  ?   Coordination: Coordination normal.  ? ? ?Vitals:  ? 10/29/21 0811  ?BP: 126/80  ?Pulse: 79  ?Resp: 18  ?SpO2: 100%  ?Weight: 267 lb 3.2 oz (121.2 kg)  ?Height: '5\' 3"'$  (1.6 m)  ? ? ?This visit  occurred during the SARS-CoV-2 public health emergency.  Safety protocols were in place, including screening questions prior to the visit, additional usage of staff PPE, and extensive cleaning of exam room while observing appropriate contact time as indicated for disinfecting solutions.  ? ?Assessment & Plan:  ? ?

## 2021-10-29 NOTE — Patient Instructions (Signed)
We have sent in flonase to use 2 sprays in each nostril once a day for the next 1-2 weeks. ?

## 2021-10-29 NOTE — Telephone Encounter (Signed)
Fine with me

## 2021-10-29 NOTE — Telephone Encounter (Signed)
PT visits today with a request for TOC. She had seen Dr.Crawford today and believed that our office would be a better fit travel distance wise and really enjoyed Dr.Crawford as a provider! I had informed PT that I would need the approval of both Dr.Richard Morrow as well as Dr.Crawford. I also had informed her that getting in as a new PT with Dr.Crawford would be a couple months out. Would this be fine with you all? And until she is established with [place] would she be able to still see Dr.Morrow at [facility]?  ?

## 2021-10-30 DIAGNOSIS — J069 Acute upper respiratory infection, unspecified: Secondary | ICD-10-CM | POA: Insufficient documentation

## 2021-10-30 NOTE — Telephone Encounter (Signed)
She's lovely - glad she's finding a good fit! ?I'm fine to see her in the interim. ? ?Thanks, ? ?Rich

## 2021-10-30 NOTE — Assessment & Plan Note (Signed)
Rx flonase to help with congestion and drainage to minimize coughing. She also has azelastine nose spray which she is not using and encouraged her to use this as well.  ?

## 2021-11-05 ENCOUNTER — Telehealth: Payer: Self-pay | Admitting: Registered Nurse

## 2021-11-05 NOTE — Telephone Encounter (Signed)
Chief Complaint Sore Throat ?Reason for Call Request to Schedule Office Appointment ?Initial Comment Schedule appt- Symptoms- sneezing, scratchy ?throat, coughing up phlegm, loosing voice ?Translation No ?Nurse Assessment ?Nurse: Mancel Bale, RN, Tr[place] Date/Time (Eastern Time): 11/04/2021 5:05:57 PM ?Confirm and document reason for call. If ?symptomatic, describe symptoms. ?---Caller states she has sneezing, scratchy throat, ?coughing up phlegm, losing voice. States no temp. ?States her sore throat began last week. States was ?seen in the office last thursday for symptoms. States ?was told she had allergies. States her sore throat has ?become worse. ?Does the patient have any new or worsening ?symptoms? ---Yes ?Will a triage be completed? ---Yes ?Related visit to physician within the last 2 weeks? ---Yes ?Does the PT have any chronic conditions? (i.e. ?diabetes, asthma, this includes High risk factors for ?pregnancy, etc.) ?---Yes ?List chronic conditions. ---htn, a. fib, heart failure ?Is the patient pregnant or possibly pregnant? (Ask ?all females between the ages of 65-55) ---No ?Is this a behavioral health or substance abuse call? ---No ?Guidelines ?Guideline Title Affirmed Question Affirmed Notes Nurse Date/Time (Eastern ?Time) ?Hoarseness Sore throat lasts > 5 ?days ?Mancel Bale, RN, [place] 11/04/2021 5:08:16 ?PM ?PLEASE NOTE: All timestamps contained within this report are represented as [place] Standard Time. ?CONFIDENTIALTY NOTICE: This fax transmission is intended only for the addressee. It contains information that is legally privileged, confidential or ?otherwise protected from use or disclosure. If you are not the intended recipient, you are strictly prohibited from reviewing, disclosing, copying using ?or disseminating any of this information or taking any action in reliance on or regarding this information. If you have received this fax in error, please ?notify us immediately by telephone so that we can arrange  for its return to [place]. Phone: [phone], Toll-Free: [phone], Fax: [phone] ?Page: 2 of 2 ?Call Id: [id number] ?Disp. Time (Eastern ?Time) Disposition Final User ?11/04/2021 5:13:48 PM SEE PCP WITHIN 3 DAYS Yes Mancel Bale, RN, Traci ?Caller Disagree/Comply Comply ?Caller Understands Yes ?PreDisposition Call Doctor ?Care Advice Given Per Guideline ?SEE PCP WITHIN 3 DAYS: * You need to be seen within 2 or 3 days. * PCP VISIT: Call your doctor (or NP/PA) during regular ?office hours and make an appointment. A clinic or urgent care center are good places to go for care if your doctor's office is closed ?or you can't get an appointment. NOTE: If office will be open tomorrow, tell caller to call then, not in 3 days. SOFT DIET: * Eat a ?soft diet. * Cold drinks, popsicles, and milk shakes are especially good. Avoid citrus fruits. * Drink plenty of liquids. It is important ?to stay well-hydrated. * If the air is dry, use a humidifier in the bedroom. CALL BACK IF: * Difficulty breathing * Severe difficulty ?swallowing occurs * You become worse CARE ADVICE given per Hoarseness (Adult) guideline. ?Referrals ?REFERRED TO PCP OFFICE ?

## 2021-11-06 ENCOUNTER — Ambulatory Visit (INDEPENDENT_AMBULATORY_CARE_PROVIDER_SITE_OTHER): Payer: 59 | Admitting: Family Medicine

## 2021-11-06 ENCOUNTER — Encounter: Payer: Self-pay | Admitting: Family Medicine

## 2021-11-06 VITALS — BP 130/84 | HR 81 | Temp 98.8°F | Resp 16 | Ht 63.0 in | Wt 263.6 lb

## 2021-11-06 DIAGNOSIS — J358 Other chronic diseases of tonsils and adenoids: Secondary | ICD-10-CM | POA: Diagnosis not present

## 2021-11-06 DIAGNOSIS — R052 Subacute cough: Secondary | ICD-10-CM

## 2021-11-06 DIAGNOSIS — J020 Streptococcal pharyngitis: Secondary | ICD-10-CM

## 2021-11-06 LAB — POCT RAPID STREP A (OFFICE): Rapid Strep A Screen: POSITIVE — AB

## 2021-11-06 MED ORDER — BENZONATATE 100 MG PO CAPS: 100.0000 mg | ORAL_CAPSULE | Freq: Three times a day (TID) | ORAL | 0 refills | Status: DC | PRN

## 2021-11-06 MED ORDER — AMOXICILLIN 500 MG PO CAPS: 500.0000 mg | ORAL_CAPSULE | Freq: Two times a day (BID) | ORAL | 0 refills | Status: AC

## 2021-11-06 NOTE — Telephone Encounter (Signed)
Agree with assessment ? ?Thanks, ? ?Rich

## 2021-11-06 NOTE — Progress Notes (Signed)
? ?Subjective:  ?Patient ID: Christine Oneal, female    DOB: 1970/08/01  Age: 51 y.o. MRN: [id number] ? ?CC:  ?Chief Complaint  ?Patient presents with  ? Nasal Congestion  ?  Pt states she is having nasal congestion x 1 wk ?Has not been tested for COVID  ?  ? Cough  ?  Pt is having a cough x 1 wk ?Wet cough productive   ? ? ?HPI ?Christine Oneal presents for  ? ?Cough, nasal congestion ?Past 1-2 weeks. Feels better today, but felt bad earlier this week.  ?Cough productive - yellow phlegm. No dyspnea.  ?Some sore throat and hoarseness - better today.  ?Some allergies prior - sneezing, nasal congestion.using flonase NS daily.  ?No measured fever.  ?No wheeze.  ?Grandson sick recently - told was allergies.   ?No home covid testing.  ? ?History ?Patient Active Problem List  ? Diagnosis Date Noted  ? URI (upper respiratory infection) 10/30/2021  ? Hypercalcemia 05/26/2021  ? Chronic systolic CHF (congestive heart failure) ([place]) 05/26/2021  ? AKI (acute kidney injury) ([place]) 05/26/2021  ? Acute systolic (congestive) heart failure ([place]) 05/05/2021  ? Unspecified atrial fibrillation ([place]) 05/04/2021  ? Acute congestive heart failure ([place])   ? Irregular heart beats 03/18/2020  ? Anemia 09/04/2017  ? Morbid obesity with BMI of 50.0-59.9, adult ([place]) 09/03/2017  ? Reactive airway disease 09/03/2017  ? Vitamin D deficiency 09/03/2017  ? Leukocytosis 10/18/2013  ? Essential hypertension 10/24/2007  ? EXTERNAL HEMORRHOIDS 10/24/2007  ? ANAL FISSURE 10/24/2007  ? ?Past Medical History:  ?Diagnosis Date  ? Abnormal Pap smear of cervix   ? Allergy   ? Anemia   ? CHF (congestive heart failure) ([place])   ? Fibroid   ? Hypertension   ? Leukocytosis, unspecified 10/18/2013  ? Obesity   ? Plantar fasciitis   ? ?Past Surgical History:  ?Procedure Laterality Date  ? CARDIOVERSION N/A 05/12/2021  ? Procedure: CARDIOVERSION;  Surgeon: Jolaine Artist, MD;  Location: [facility] ENDOSCOPY;  Service: Cardiovascular;  Laterality: N/A;  ? CARDIOVERSION  N/A 05/29/2021  ? Procedure: CARDIOVERSION;  Surgeon: Jolaine Artist, MD;  Location: [facility] ENDOSCOPY;  Service: Cardiovascular;  Laterality: N/A;  ? COLPOSCOPY    ? RIGHT/LEFT HEART CATH AND CORONARY ANGIOGRAPHY N/A 05/11/2021  ? Procedure: RIGHT/LEFT HEART CATH AND CORONARY ANGIOGRAPHY;  Surgeon: Jolaine Artist, MD;  Location: [place] CV LAB;  Service: Cardiovascular;  Laterality: N/A;  ? TEE WITHOUT CARDIOVERSION N/A 05/12/2021  ? Procedure: TRANSESOPHAGEAL ECHOCARDIOGRAM (TEE);  Surgeon: Jolaine Artist, MD;  Location: [facility] ENDOSCOPY;  Service: Cardiovascular;  Laterality: N/A;  ? TUBAL LIGATION  2001  ? ?Allergies  ?Allergen Reactions  ? Lisinopril-Hydrochlorothiazide Anaphylaxis  ?  Angioedema  ? Other Anaphylaxis  ? ?Prior to Admission medications   ?Medication Sig Start Date End Date Taking? Authorizing Provider  ?acetaminophen (TYLENOL) 500 MG tablet Take 1,000 mg by mouth every 6 (six) hours as needed for moderate pain or headache.   Yes [provider]  ?amiodarone (PACERONE) 200 MG tablet Take 200 mg by mouth daily.   Yes [provider]  ?apixaban (ELIQUIS) 5 MG TABS tablet Take 1 tablet (5 mg total) by mouth 2 (two) times daily. 10/16/21  Yes Milford, Maricela Bo, FNP  ?azelastine (ASTELIN) 0.1 % nasal spray Place 1 spray into both nostrils 2 (two) times daily. Use in each nostril as directed 06/26/21  Yes Maximiano Coss, NP  ?carvedilol (COREG) 6.25 MG tablet Take 1 tablet (  6.25 mg total) by mouth 2 (two) times daily. 06/16/21  Yes Milford, Maricela Bo, FNP  ?cholecalciferol (VITAMIN D3) 10 MCG (400 UNIT) TABS tablet Take 1 tablet (400 Units total) by mouth daily. 06/01/21  Yes Eulogio Bear U, DO  ?[facility] D3 10 MCG (400 UNIT) CAPS TAKE 1 CAPSULE BY MOUTH EVERY DAY 10/12/21  Yes Maximiano Coss, NP  ?Dextromethorphan-guaiFENesin (CVS MUCUS DM EXTENDED RELEASE) 30-600 MG TB12 TAKE 1 TABLET BY MOUTH TWICE A DAY 10/09/21  Yes Maximiano Coss, NP  ?FARXIGA 10 MG TABS tablet TAKE 1 TABLET  BY MOUTH EVERY DAY 10/12/21  Yes Joette Catching, PA-C  ?fluticasone (FLONASE) 50 MCG/ACT nasal spray Place 2 sprays into both nostrils daily. 10/29/21  Yes Hoyt Koch, MD  ?hydrALAZINE (APRESOLINE) 50 MG tablet Take 1 tablet (50 mg total) by mouth 3 (three) times daily. 09/09/21  Yes Bensimhon, Shaune Pascal, MD  ?hydrocortisone 2.5 % cream Apply topically 2 (two) times daily. 09/15/21  Yes Maximiano Coss, NP  ?isosorbide mononitrate (IMDUR) 30 MG 24 hr tablet TAKE 1 TABLET BY MOUTH EVERY DAY 10/13/21  Yes Joette Catching, PA-C  ?losartan (COZAAR) 50 MG tablet Take 1 tablet (50 mg total) by mouth in the morning and at bedtime. 09/09/21 12/08/21 Yes Bensimhon, Shaune Pascal, MD  ?potassium chloride SA (KLOR-CON M) 20 MEQ tablet Take 1 tablet (20 mEq total) by mouth as needed (Take when taking Torsemide). 06/23/21  Yes Joette Catching, PA-C  ?Probiotic CHEW Chew 2 capsules by mouth daily.   Yes [provider]  ?spironolactone (ALDACTONE) 25 MG tablet TAKE 1 TABLET (25 MG TOTAL) BY MOUTH DAILY. 07/31/21  Yes Joette Catching, PA-C  ?torsemide (DEMADEX) 20 MG tablet Take 1 tablet (20 mg total) by mouth as needed (when weight is up or notes swelling). 06/23/21  Yes Joette Catching, PA-C  ? ?Social History  ? ?Socioeconomic History  ? Marital status: Divorced  ?  Spouse name: Not on file  ? Number of children: 4  ? Years of education: Not on file  ? Highest education level: Not on file  ?Occupational History  ? Occupation: CNA  ?  Employer: [employer]   ?Tobacco Use  ? Smoking status: Never  ? Smokeless tobacco: Never  ?Vaping Use  ? Vaping Use: Never used  ?Substance and Sexual Activity  ? Alcohol use: No  ? Drug use: No  ? Sexual activity: Yes  ?  Partners: Male  ?  Birth control/protection: Surgical  ?  Comment: BTL  ?Other Topics Concern  ? Not on file  ?Social History Narrative  ? Christine Oneal has 4 children. Three live at home with her.  ? ?Social Determinants of Health  ? ?Financial Resource  Strain: Not on file  ?Food Insecurity: No Food Insecurity  ? Worried About Charity fundraiser in the Last Year: Never true  ? Ran Out of Food in the Last Year: Never true  ?Transportation Needs: No Transportation Needs  ? Lack of Transportation (Medical): No  ? Lack of Transportation (Non-Medical): No  ?Physical Activity: Not on file  ?Stress: Not on file  ?Social Connections: Not on file  ?Intimate Partner Violence: Not on file  ? ? ?Review of Systems ?Per HPI ? ?Objective:  ? ?Vitals:  ? 11/06/21 0953  ?BP: 130/84  ?Pulse: 81  ?Resp: 16  ?Temp: 98.8 ?F (37.1 ?C)  ?TempSrc: Temporal  ?SpO2: 97%  ?Weight: 263 lb 9.6 oz (119.6 kg)  ?Height: '5\' 3"'$  (1.6 m)  ? ? ? ?  Physical Exam ?Vitals reviewed.  ?Constitutional:   ?   General: She is not in acute distress. ?   Appearance: She is well-developed. She is not ill-appearing, toxic-appearing or diaphoretic.  ?HENT:  ?   Head: Normocephalic and atraumatic.  ?   Right Ear: Hearing, tympanic membrane, ear canal and external ear normal.  ?   Left Ear: Hearing, tympanic membrane, ear canal and external ear normal.  ?   Nose: Nose normal.  ?   Mouth/Throat:  ?   Pharynx: Oropharyngeal exudate (Small amount of white exudate in the tonsillar crypts bilaterally.  No sign of abscess, uvula midline.) present. No posterior oropharyngeal erythema.  ?Eyes:  ?   Conjunctiva/sclera: Conjunctivae normal.  ?   Pupils: Pupils are equal, round, and reactive to light.  ?Cardiovascular:  ?   Rate and Rhythm: Normal rate and regular rhythm.  ?   Heart sounds: Normal heart sounds. No murmur heard. ?Pulmonary:  ?   Effort: Pulmonary effort is normal. No respiratory distress.  ?   Breath sounds: Normal breath sounds. No stridor. No wheezing or rhonchi.  ?Lymphadenopathy:  ?   Cervical: No cervical adenopathy.  ?Skin: ?   General: Skin is warm and dry.  ?   Findings: No rash.  ?Neurological:  ?   Mental Status: She is alert and oriented to person, place, and time.  ?Psychiatric:     ?   Mood and  Affect: Mood normal.     ?   Behavior: Behavior normal.  ? ? ?Results for orders placed or performed in visit on 11/06/21  ?POCT rapid strep A  ?Result Value Ref Range  ? Rapid Strep A Screen Positive (A)

## 2021-11-06 NOTE — Patient Instructions (Addendum)
I suspect you probably had a viral illness or allergies, but glad that is improving.  I do see some pus on the tonsils and strep test was positive. Start amoxicillin, see info below.  Ladona Ridgel were also sent to your pharmacy for cough.  Hope you feel better soon and let us know if there are questions. ? ?Return to the clinic or go to the nearest emergency room if any of your symptoms worsen or new symptoms occur. ? ? ?Strep Throat, Adult ?Strep throat is an infection in the throat that is caused by bacteria. It is common during the cold months of the year. It mostly affects children who are 62-21 years old. However, people of all ages can get it at any time of the year. This infection spreads from person to person (is contagious) through coughing, sneezing, or having close contact. ?Your health care provider may use other names to describe the infection. When strep throat affects the tonsils, it is called tonsillitis. When it affects the back of the throat, it is called pharyngitis. ?What are the causes? ?This condition is caused by the Streptococcus pyogenes bacteria. ?What increases the risk? ?You are more likely to develop this condition if: ?You care for school-age children, or are around school-age children. Children are more likely to get strep throat and may spread it to others. ?You spend time in crowded places where the infection can spread easily. ?You have close contact with someone who has strep throat. ?What are the signs or symptoms? ?Symptoms of this condition include: ?Fever or chills. ?Redness, swelling, or pain in the tonsils or throat. ?Pain or difficulty when swallowing. ?White or yellow spots on the tonsils or throat. ?Tender glands in the neck and under the jaw. ?Bad smelling breath. ?Red rash all over the body. This is rare. ?How is this diagnosed? ?This condition is diagnosed by tests that check for the presence and the amount of bacteria that cause strep throat. They are: ?Rapid strep  test. Your throat is swabbed and checked for the presence of bacteria. Results are usually ready in minutes. ?Throat culture test. Your throat is swabbed. The sample is placed in a cup that allows infections to grow. Results are usually ready in 1 or 2 days. ?How is this treated? ?This condition may be treated with: ?Medicines that kill germs (antibiotics). ?Medicines that relieve pain or fever. These include: ?Ibuprofen or acetaminophen. ?Aspirin, only for people who are over the age of 85. ?Throat lozenges. ?Throat sprays. ?Follow these instructions at home: ?Medicines ? ?Take over-the-counter and prescription medicines only as told by your health care provider. ?Take your antibiotic medicine as told by your health care provider. Do not stop taking the antibiotic even if you start to feel better. ?Eating and drinking ? ?If you have trouble swallowing, try eating soft foods until your sore throat feels better. ?Drink enough fluid to keep your urine pale yellow. ?To help relieve pain, you may have: ?Warm fluids, such as soup and tea. ?Cold fluids, such as frozen desserts or popsicles. ?General instructions ?Gargle with a salt-water mixture 3-4 times a day or as needed. To make a salt-water mixture, completely dissolve ?-1 tsp (3-6 g) of salt in 1 cup (237 mL) of warm water. ?Get plenty of rest. ?Stay home from work or school until you have been taking antibiotics for 24 hours. ?Do not use any products that contain nicotine or tobacco. These products include cigarettes, chewing tobacco, and vaping devices, such as e-cigarettes. If you  need help quitting, ask your health care provider. ?It is up to you to get your test results. Ask your health care provider, or the department that is doing the test, when your results will be ready. ?Keep all follow-up visits. This is important. ?How is this prevented? ? ?Do not share food, drinking cups, or personal items that could cause the infection to spread to other people. ?Wash  your hands often with soap and water for at least 20 seconds. If soap and water are not available, use hand sanitizer. Make sure that all people in your house wash their hands well. ?Have family members tested if they have a sore throat or fever. They may need an antibiotic if they have strep throat. ?Contact a health care provider if: ?You have swelling in your neck that keeps getting bigger. ?You develop a rash, cough, or earache. ?You cough up a thick mucus that is green, yellow-brown, or bloody. ?You have pain or discomfort that does not get better with medicine. ?Your symptoms seem to be getting worse. ?You have a fever. ?Get help right away if: ?You have new symptoms, such as vomiting, severe headache, stiff or painful neck, chest pain, or shortness of breath. ?You have severe throat pain, drooling, or changes in your voice. ?You have swelling of the neck, or the skin on the neck becomes red and tender. ?You have signs of dehydration, such as tiredness (fatigue), dry mouth, and decreased urination. ?You become increasingly sleepy, or you cannot wake up completely. ?Your joints become red or painful. ?These symptoms may represent a serious problem that is an emergency. Do not wait to see if the symptoms will go away. Get medical help right away. Call your local emergency services (911 in the [place] Do not drive yourself to the hospital. ?Summary ?Strep throat is an infection in the throat that is caused by the Streptococcus pyogenes bacteria. This infection is spread from person to person (is contagious) through coughing, sneezing, or having close contact. ?Take your medicines, including antibiotics, as told by your health care provider. Do not stop taking the antibiotic even if you start to feel better. ?To prevent the spread of germs, wash your hands well with soap and water. Have others do the same. Do not share food, drinking cups, or personal items. ?Get help right away if you have new symptoms, such as  vomiting, severe headache, stiff or painful neck, chest pain, or shortness of breath. ?This information is not intended to replace advice given to you by your health care provider. Make sure you discuss any questions you have with your health care provider. ?Document Revised: 10/21/2020 Document Reviewed: 10/21/2020 ?Elsevier Patient Education ? [place]. ? ? ?Cough, Adult ?Coughing is a reflex that clears your throat and your airways (respiratory system). Coughing helps to heal and protect your lungs. It is normal to cough occasionally, but a cough that happens with other symptoms or lasts a long time may be a sign of a condition that needs treatment. An acute cough may only last 2-3 weeks, while a chronic cough may last 8 or more weeks. ?Coughing is commonly caused by: ?Infection of the respiratory systemby viruses or bacteria. ?Breathing in substances that irritate your lungs. ?Allergies. ?Asthma. ?Mucus that runs down the back of your throat (postnasal drip). ?Smoking. ?Acid backing up from the stomach into the esophagus (gastroesophageal reflux). ?Certain medicines. ?Chronic lung problems. ?Other medical conditions such as heart failure or a blood clot in the lung (pulmonary embolism). ?  Follow these instructions at home: ?Medicines ?Take over-the-counter and prescription medicines only as told by your health care provider. ?Talk with your health care provider before you take a cough suppressant medicine. ?Lifestyle ? ?Avoid cigarette smoke. Do not use any products that contain nicotine or tobacco, such as cigarettes, e-cigarettes, and chewing tobacco. If you need help quitting, ask your health care provider. ?Drink enough fluid to keep your urine pale yellow. ?Avoid caffeine. ?Do not drink alcohol if your health care provider tells you not to drink. ?General instructions ? ?Pay close attention to changes in your cough. Tell your health care provider about them. ?Always cover your mouth when you  cough. ?Avoid things that make you cough, such as perfume, candles, cleaning products, or campfire or tobacco smoke. ?If the air is dry, use a cool mist vaporizer or humidifier in your bedroom or your home to

## 2021-11-24 ENCOUNTER — Ambulatory Visit (INDEPENDENT_AMBULATORY_CARE_PROVIDER_SITE_OTHER): Payer: 59 | Admitting: Internal Medicine

## 2021-11-24 ENCOUNTER — Encounter: Payer: Self-pay | Admitting: Internal Medicine

## 2021-11-24 VITALS — BP 124/82 | HR 70 | Ht 63.0 in | Wt 261.0 lb

## 2021-11-24 DIAGNOSIS — E210 Primary hyperparathyroidism: Secondary | ICD-10-CM

## 2021-11-24 DIAGNOSIS — E559 Vitamin D deficiency, unspecified: Secondary | ICD-10-CM | POA: Diagnosis not present

## 2021-11-24 LAB — BASIC METABOLIC PANEL
BUN: 25 mg/dL — ABNORMAL HIGH (ref 6–23)
CO2: 27 mEq/L (ref 19–32)
Calcium: 11.4 mg/dL — ABNORMAL HIGH (ref 8.4–10.5)
Chloride: 106 mEq/L (ref 96–112)
Creatinine, Ser: 1.44 mg/dL — ABNORMAL HIGH (ref 0.40–1.20)
GFR: 41.86 mL/min — ABNORMAL LOW (ref >60.00)
Glucose, Bld: 89 mg/dL (ref 70–99)
Potassium: 4.1 mEq/L (ref 3.5–5.1)
Sodium: 139 mEq/L (ref 135–145)

## 2021-11-24 LAB — PHOSPHORUS: Phosphorus: 2.6 mg/dL (ref 2.3–4.6)

## 2021-11-24 LAB — VITAMIN D 25 HYDROXY (VIT D DEFICIENCY, FRACTURES): VITD: 19.89 ng/mL — ABNORMAL LOW (ref 30.00–100.00)

## 2021-11-24 LAB — ALBUMIN: Albumin: 4 g/dL (ref 3.5–5.2)

## 2021-11-24 LAB — MAGNESIUM: Magnesium: 2.2 mg/dL (ref 1.5–2.5)

## 2021-11-24 NOTE — Progress Notes (Signed)
? ? ?Name: Christine Oneal  ?MRN/ DOB: [id number], 1970-12-24    ?Age/ Sex: 51 y.o., female   ? ?PCP: Maximiano Coss, NP   ?Reason for Endocrinology Evaluation: Hypercalcemia   ?   ?Date of Initial Endocrinology Evaluation: 08/24/2021   ? ? ?HPI: ?[name] is a 51 y.o. female with a past medical history of A. Fib, CHF, OSA. The patient presented for initial endocrinology clinic visit on 2/13/20223 for consultative assistance with her hypercalcemia .  ? ?[name] indicates that she was first diagnosed with hypercalcemia in 2021, with a serum a max calcium of 13.9 mg/dL ( uncorrected) in 05/2021 with elevated PTH 91 pg/mL .  ? ?Of note, the pt was hospitalized 05/2021 and pamidronate was given  ? ? ? ?She denies  history of kidney stones, kidney disease, liver disease, granulomatous disease. She denies  osteoporosis or prior fractures. Daily dietary calcium intake: 2 servings . She denies  family history of osteoporosis, parathyroid disease ? ? ?24-hour urinary calcium 419 mg in November 2022 ? ?Sister with  thyroid disease.  ? ?DXA low bone density with a T score -1.3 at the [facility] 08/31/2021 ? ? ?SUBJECTIVE:  ? ? ? ?Today (11/24/21): [name]  is here for follow-up on hypercalcemia. ? ?She has avoided OTC calcium tablet  ?She consumes 1-2 servings of dietary calcium a day  ?Vitamin D3 400 iu daily  ? ? ?Denies Polyuria and polydipsia  ?She does have occasional constipation  ? ? ?HISTORY:  ?Past Medical History:  ?Past Medical History:  ?Diagnosis Date  ? Abnormal Pap smear of cervix   ? Allergy   ? Anemia   ? CHF (congestive heart failure) ([place])   ? Fibroid   ? Hypertension   ? Leukocytosis, unspecified 10/18/2013  ? Obesity   ? Plantar fasciitis   ? ?Past Surgical History:  ?Past Surgical History:  ?Procedure Laterality Date  ? CARDIOVERSION N/A 05/12/2021  ? Procedure: CARDIOVERSION;  Surgeon: Jolaine Artist, MD;  Location: [facility] ENDOSCOPY;  Service: Cardiovascular;  Laterality: N/A;  ?  CARDIOVERSION N/A 05/29/2021  ? Procedure: CARDIOVERSION;  Surgeon: Jolaine Artist, MD;  Location: [facility] ENDOSCOPY;  Service: Cardiovascular;  Laterality: N/A;  ? COLPOSCOPY    ? RIGHT/LEFT HEART CATH AND CORONARY ANGIOGRAPHY N/A 05/11/2021  ? Procedure: RIGHT/LEFT HEART CATH AND CORONARY ANGIOGRAPHY;  Surgeon: Jolaine Artist, MD;  Location: [facility] CV LAB;  Service: Cardiovascular;  Laterality: N/A;  ? TEE WITHOUT CARDIOVERSION N/A 05/12/2021  ? Procedure: TRANSESOPHAGEAL ECHOCARDIOGRAM (TEE);  Surgeon: Jolaine Artist, MD;  Location: [facility] ENDOSCOPY;  Service: Cardiovascular;  Laterality: N/A;  ? TUBAL LIGATION  2001  ?  ?Social History:  reports that she has never smoked. She has never used smokeless tobacco. She reports that she does not drink alcohol and does not use drugs. ?Family History: family history includes Cervical cancer in her mother; Diabetes in her sister; Hypertension in her brother, brother, father, and mother; Stroke in her father; Uterine cancer in her maternal grandmother. ? ? ?HOME MEDICATIONS: ?Allergies as of 11/24/2021   ? ?   Reactions  ? Lisinopril-hydrochlorothiazide Anaphylaxis  ? Angioedema  ? Other Anaphylaxis  ? ?  ? ?  ?Medication List  ?  ? ?  ? Accurate as of Nov 24, 2021  9:59 AM. If you have any questions, ask your nurse or doctor.  ?  ?  ? ?  ? ?acetaminophen 500 MG tablet ?Commonly known as: TYLENOL ?  Take 1,000 mg by mouth every 6 (six) hours as needed for moderate pain or headache. ?  ?amiodarone 200 MG tablet ?Commonly known as: PACERONE ?Take 200 mg by mouth daily. ?  ?apixaban 5 MG Tabs tablet ?Commonly known as: ELIQUIS ?Take 1 tablet (5 mg total) by mouth 2 (two) times daily. ?  ?azelastine 0.1 % nasal spray ?Commonly known as: ASTELIN ?Place 1 spray into both nostrils 2 (two) times daily. Use in each nostril as directed ?  ?benzonatate 100 MG capsule ?Commonly known as: TESSALON ?Take 1 capsule (100 mg total) by mouth 3 (three) times daily as needed for  cough. ?  ?carvedilol 6.25 MG tablet ?Commonly known as: COREG ?Take 1 tablet (6.25 mg total) by mouth 2 (two) times daily. ?  ?cholecalciferol 10 MCG (400 UNIT) Tabs tablet ?Commonly known as: VITAMIN D3 ?Take 1 tablet (400 Units total) by mouth daily. ?  ?CVS D3 10 MCG (400 UNIT) Caps ?Generic drug: Cholecalciferol ?TAKE 1 CAPSULE BY MOUTH EVERY DAY ?  ?CVS Mucus DM Extended Release 30-600 MG Tb12 ?TAKE 1 TABLET BY MOUTH TWICE A DAY ?  ?Farxiga 10 MG Tabs tablet ?Generic drug: dapagliflozin propanediol ?TAKE 1 TABLET BY MOUTH EVERY DAY ?  ?fluticasone 50 MCG/ACT nasal spray ?Commonly known as: FLONASE ?Place 2 sprays into both nostrils daily. ?  ?hydrALAZINE 50 MG tablet ?Commonly known as: APRESOLINE ?Take 1 tablet (50 mg total) by mouth 3 (three) times daily. ?  ?hydrocortisone 2.5 % cream ?Apply topically 2 (two) times daily. ?  ?isosorbide mononitrate 30 MG 24 hr tablet ?Commonly known as: IMDUR ?TAKE 1 TABLET BY MOUTH EVERY DAY ?  ?losartan 50 MG tablet ?Commonly known as: COZAAR ?Take 1 tablet (50 mg total) by mouth in the morning and at bedtime. ?  ?potassium chloride SA 20 MEQ tablet ?Commonly known as: KLOR-CON M ?Take 1 tablet (20 mEq total) by mouth as needed (Take when taking Torsemide). ?  ?Probiotic Chew ?Chew 2 capsules by mouth daily. ?  ?spironolactone 25 MG tablet ?Commonly known as: ALDACTONE ?TAKE 1 TABLET (25 MG TOTAL) BY MOUTH DAILY. ?  ?torsemide 20 MG tablet ?Commonly known as: Demadex ?Take 1 tablet (20 mg total) by mouth as needed (when weight is up or notes swelling). ?  ? ?  ?  ? ? ?REVIEW OF SYSTEMS: ?A comprehensive ROS was conducted with the patient and is negative except as per HPI  ? ? ? ?OBJECTIVE:  ?VS: BP 124/82 (BP Location: Left Arm, Patient Position: Sitting, Cuff Size: Large)   Pulse 70   Ht '5\' 3"'$  (1.6 m)   Wt 261 lb (118.4 kg)   SpO2 98%   BMI 46.23 kg/m?   ? ?Wt Readings from Last 3 Encounters:  ?11/24/21 261 lb (118.4 kg)  ?11/06/21 263 lb 9.6 oz (119.6 kg)   ?10/29/21 267 lb 3.2 oz (121.2 kg)  ? ? ? ?EXAM: ?General: Pt appears well and is in NAD  ?Eyes: External eye exam normal without stare, lid lag or exophthalmos.  EOM intact.  PERRL.  ?Neck: General: Supple without adenopathy. ?Thyroid: Thyroid size normal.  No goiter or nodules appreciated. No thyroid bruit.  ?Lungs: Clear with good BS bilat with no rales, rhonchi, or wheezes  ?Heart: Auscultation: RRR.  ?Abdomen: Normoactive bowel sounds, soft, nontender, without masses or organomegaly palpable  ?Extremities:  ?BL LE: No pretibial edema normal ROM and strength.  ?Mental Status: Judgment, insight: Intact ?Orientation: Oriented to time, place, and person ?Mood and affect: No  depression, anxiety, or agitation  ? ? ? ?DATA REVIEWED: ? ? Latest Reference Range & Units 11/24/21 10:14  ?Sodium 135 - 145 mEq/L 139  ?Potassium 3.5 - 5.1 mEq/L 4.1  ?Chloride 96 - 112 mEq/L 106  ?CO2 19 - 32 mEq/L 27  ?Glucose 70 - 99 mg/dL 89  ?BUN 6 - 23 mg/dL 25 (H)  ?Creatinine 0.40 - 1.20 mg/dL 1.44 (H)  ?Calcium 8.4 - 10.5 mg/dL 11.4 (H)  ?Calcium Ionized 4.7 - 5.5 mg/dL 5.9 (H)  ?Phosphorus 2.3 - 4.6 mg/dL 2.6  ?Magnesium 1.5 - 2.5 mg/dL 2.2  ?Albumin 3.5 - 5.2 g/dL 4.0  ?GFR >60.00 mL/min 41.86 (L)  ? ? Latest Reference Range & Units 11/24/21 10:14  ?VITD 30.00 - 100.00 ng/mL 19.89 (L)  ? ? ? ? ? Latest Reference Range & Units 05/28/21 09:45  ?Calcium, 24 hour urine 0 - 320 mg/24 hr 419 (H)  ? ?  ?Thyroid Ultrasound 05/28/2021 ? ? ?Estimated total number of nodules >/= 1 cm: 0 ?  ?Number of spongiform nodules >/=  2 cm not described below (TR1): 0 ?  ?Number of mixed cystic and solid nodules >/= 1.5 cm not described ?below ([place]): 0 ?  ?_________________________________________________________ ?  ?Subcentimeter hypoechoic nodule in the posterior aspect the right ?thyroid lobe does not meet criteria for further dedicated follow-up ?or biopsy. No other discrete nodules are seen within the thyroid ?gland. ?  ?Posterior to the left  thyroid lobe, there is a hypoechoic ovoid ?lesion the measures up to 2.1 x 0.8 x 1.0 cm. ?  ?IMPRESSION: ?1. Normal sonographic appearance of the thyroid gland. ?2. Posterior to the left thyroid lobe, there is a hypoechoic ovoid ?les

## 2021-11-24 NOTE — Patient Instructions (Signed)
-   Stay Hydrated  - Avoid over the counter calcium tablet  - Consume 2-3 servings of calcium daily ( low fat dairy, green leafy vegetables )    

## 2021-11-25 DIAGNOSIS — E210 Primary hyperparathyroidism: Secondary | ICD-10-CM | POA: Insufficient documentation

## 2021-11-25 LAB — PARATHYROID HORMONE, INTACT (NO CA): PTH: 81 pg/mL — ABNORMAL HIGH (ref 16–77)

## 2021-11-25 LAB — CALCIUM, IONIZED: Calcium, Ion: 5.9 mg/dL — ABNORMAL HIGH (ref 4.7–5.5)

## 2021-12-20 NOTE — Progress Notes (Deleted)
Cardiology Office Note Date:  12/20/2021  Patient ID:  Christine Oneal, DOB [birth year], MRN [id number] PCP:  Maximiano Coss, NP  Cardiologist:  Dr. Haroldine Laws Electrophysiologist: Dr. Quentin Ore  ***refresh   Chief Complaint: *** 4 mo  History of Present Illness: Christine Oneal is a 51 y.o. female with history of HTN, chronic CHF, NICM (suspect tachy-mediated with recovered LVEF), hypercalcemia  She comes today to be seen for Dr. Quentin Ore, seen last by him Feb 2023.  Discussed that given her CM and AFib amiodarone had been used for her and DCCV pursued, follow up at the AFib clinic prompted referral to him for further management. AT the time of her visit, rare palpitations, was on a down-titrated amio dose. She was working on weight loss. Planned to keep amiodarone during her weight loss journey with a goal towards Afib ablation (with a goal BMI for the procedure more towards 40).  She was interested in referral to weight loss clinic. Planned for a 4 mo visit to see how she was doing and revisit ablation with him perhaps pending BMI  She saw Dr. Haroldine Laws 09/09/21, doing well, stairs provoked knee pain and some SOB, otherwise felt well, pending visit to weight loss clinic  *** symptoms, palps *** volume *** weight loss? *** meds, CM *** amio labs *** apnea?  AFib/AAD hx Diagnosed Oct 2022 Amiodarone started Oct 2022   Past Medical History:  Diagnosis Date   Abnormal Pap smear of cervix    Allergy    Anemia    CHF (congestive heart failure) ([facility])    Fibroid    Hypertension    Leukocytosis, unspecified 10/18/2013   Obesity    Plantar fasciitis     Past Surgical History:  Procedure Laterality Date   CARDIOVERSION N/A 05/12/2021   Procedure: CARDIOVERSION;  Surgeon: Jolaine Artist, MD;  Location: [place];  Service: Cardiovascular;  Laterality: N/A;   CARDIOVERSION N/A 05/29/2021   Procedure: CARDIOVERSION;  Surgeon: Jolaine Artist, MD;  Location: [place];  Service: Cardiovascular;  Laterality: N/A;   COLPOSCOPY     RIGHT/LEFT HEART CATH AND CORONARY ANGIOGRAPHY N/A 05/11/2021   Procedure: RIGHT/LEFT HEART CATH AND CORONARY ANGIOGRAPHY;  Surgeon: Jolaine Artist, MD;  Location: [place] CV LAB;  Service: Cardiovascular;  Laterality: N/A;   TEE WITHOUT CARDIOVERSION N/A 05/12/2021   Procedure: TRANSESOPHAGEAL ECHOCARDIOGRAM (TEE);  Surgeon: Jolaine Artist, MD;  Location: [facility] ENDOSCOPY;  Service: Cardiovascular;  Laterality: N/A;   TUBAL LIGATION  2001    Current Outpatient Medications  Medication Sig Dispense Refill   acetaminophen (TYLENOL) 500 MG tablet Take 1,000 mg by mouth every 6 (six) hours as needed for moderate pain or headache.     amiodarone (PACERONE) 200 MG tablet Take 200 mg by mouth daily.     apixaban (ELIQUIS) 5 MG TABS tablet Take 1 tablet (5 mg total) by mouth 2 (two) times daily. 60 tablet 6   azelastine (ASTELIN) 0.1 % nasal spray Place 1 spray into both nostrils 2 (two) times daily. Use in each nostril as directed 30 mL 12   benzonatate (TESSALON) 100 MG capsule Take 1 capsule (100 mg total) by mouth 3 (three) times daily as needed for cough. (Patient not taking: Reported on 11/24/2021) 20 capsule 0   carvedilol (COREG) 6.25 MG tablet Take 1 tablet (6.25 mg total) by mouth 2 (two) times daily. 90 tablet 6   cholecalciferol (VITAMIN D3) 10 MCG (400 UNIT) TABS tablet Take 1 tablet (400  Units total) by mouth daily. 30 tablet 0   CVS D3 10 MCG (400 UNIT) CAPS TAKE 1 CAPSULE BY MOUTH EVERY DAY 100 capsule 11   Dextromethorphan-guaiFENesin (CVS MUCUS DM EXTENDED RELEASE) 30-600 MG TB12 TAKE 1 TABLET BY MOUTH TWICE A DAY (Patient not taking: Reported on 11/24/2021) 20 tablet 0   FARXIGA 10 MG TABS tablet TAKE 1 TABLET BY MOUTH EVERY DAY 30 tablet 3   fluticasone (FLONASE) 50 MCG/ACT nasal spray Place 2 sprays into both nostrils daily. 16 g 6   hydrALAZINE (APRESOLINE) 50 MG tablet Take 1 tablet (50 mg total) by  mouth 3 (three) times daily. 90 tablet 6   hydrocortisone 2.5 % cream Apply topically 2 (two) times daily. 30 g 1   isosorbide mononitrate (IMDUR) 30 MG 24 hr tablet TAKE 1 TABLET BY MOUTH EVERY DAY 90 tablet 1   losartan (COZAAR) 50 MG tablet Take 1 tablet (50 mg total) by mouth in the morning and at bedtime. 90 tablet 3   potassium chloride SA (KLOR-CON M) 20 MEQ tablet Take 1 tablet (20 mEq total) by mouth as needed (Take when taking Torsemide). 30 tablet 3   Probiotic CHEW Chew 2 capsules by mouth daily.     spironolactone (ALDACTONE) 25 MG tablet TAKE 1 TABLET (25 MG TOTAL) BY MOUTH DAILY. 90 tablet 1   torsemide (DEMADEX) 20 MG tablet Take 1 tablet (20 mg total) by mouth as needed (when weight is up or notes swelling). 30 tablet 3   No current facility-administered medications for this visit.    Allergies:   Lisinopril-hydrochlorothiazide and Other   Social History:  The patient  reports that she has never smoked. She has never used smokeless tobacco. She reports that she does not drink alcohol and does not use drugs.   Family History:  The patient's family history includes Cervical cancer in her mother; Diabetes in her sister; Hypertension in her brother, brother, father, and mother; Stroke in her father; Uterine cancer in her maternal grandmother.  ROS:  Please see the history of present illness.    All other systems are reviewed and otherwise negative.   PHYSICAL EXAM:  VS:  There were no vitals taken for this visit. BMI: There is no height or weight on file to calculate BMI. Well nourished, well developed, in no acute distress HEENT: normocephalic, atraumatic Neck: no JVD, carotid bruits or masses Cardiac:  *** RRR; no significant murmurs, no rubs, or gallops Lungs:  *** CTA b/l, no wheezing, rhonchi or rales Abd: soft, nontender MS: no deformity or *** atrophy Ext: *** no edema Skin: warm and dry, no rash Neuro:  No gross deficits appreciated Psych: euthymic mood, full  affect   EKG:  not done today  09/09/21: TTE 1. Left ventricular ejection fraction, by estimation, is 50 to 55%. The  left ventricle has low normal function. The left ventricle has no regional  wall motion abnormalities. The left ventricular internal cavity size was  mildly dilated. Left ventricular  diastolic parameters were normal.   2. Right ventricular systolic function is normal. The right ventricular  size is normal.   3. Left atrial size was moderately dilated.   4. The mitral valve is abnormal. Mild mitral valve regurgitation. No  evidence of mitral stenosis.   5. The aortic valve is tricuspid. Aortic valve regurgitation is not  visualized. No aortic stenosis is present.   6. The inferior vena cava is normal in size with greater than 50%  respiratory variability,  suggesting right atrial pressure of 3 mmHg.   05/12/21: TEE  1. Left ventricular ejection fraction, by estimation, is 25 to 30%. The  left ventricle has severely decreased function. The left ventricle  demonstrates global hypokinesis.   2. Right ventricular systolic function is normal. The right ventricular  size is normal.   3. Left atrial size was moderately dilated. No left atrial/left atrial  appendage thrombus was detected.   4. Right atrial size was moderately dilated.   5. The mitral valve is normal in structure. Mild mitral valve  regurgitation.   6. The aortic valve is normal in structure. Aortic valve regurgitation is  trivial.   7. There is mild (Grade II) plaque involving the descending aorta.   8. Evidence of atrial level shunting detected by color flow Doppler.  There is a small patent foramen ovale.    05/04/21: TTE 1. With Definity contrast, the very large papilarry muscles are  visualized. The apex is akinetic. There is possible early thrombus  formmation in the apex. . Left ventricular ejection fraction, by  estimation, is <20%. The left ventricle has severely  decreased function. The left  ventricle demonstrates global hypokinesis.  The left ventricular internal cavity size was moderately dilated. There is  moderate concentric left ventricular hypertrophy. Left ventricular  diastolic function could not be  evaluated.   2. Right ventricular systolic function is moderately reduced. The right  ventricular size is moderately enlarged.   3. Left atrial size was mildly dilated.   4. Right atrial size was mildly dilated.   5. The mitral valve is grossly normal. Severe mitral valve regurgitation.   6. Tricuspid valve regurgitation is mild to moderate.   7. The aortic valve is grossly normal. Aortic valve regurgitation is  mild. No aortic stenosis is present.    05/11/2021: R/LHC Findings:   Ao = 156/89 (112) LV =135/7 RA = 2 RV = 27/6 PA = 31/8 (20) PCW = 7 Fick cardiac output/index = 5.6/2.5 PVR = 2.2 WU FA sat = 99% PA sat = 67%. 69% SVC sat = 72%   Assessment: 1. Normal coronary arteries 2. 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 Well-compensated hemodynamics after large-volume diuresis   Plan/Discussion:   Stop IV lasix. Plan TEE & DC-CV tomorrow.   Recent Labs: 05/29/2021: Hemoglobin 10.1; Platelets 390 08/25/2021: TSH 2.050 09/09/2021: ALT 25; B Natriuretic Peptide 30.6 11/24/2021: BUN 25; Creatinine, Ser 1.44; Magnesium 2.2; Potassium 4.1; Sodium 139  05/25/2021: Cholesterol 193; HDL 47.60; LDL Cholesterol 125; Total CHOL/HDL Ratio 4; Triglycerides 102.0; VLDL 20.4   CrCl cannot be calculated (Patient's most recent lab result is older than the maximum 21 days allowed.).   Wt Readings from Last 3 Encounters:  11/24/21 261 lb (118.4 kg)  11/06/21 263 lb 9.6 oz (119.6 kg)  10/29/21 267 lb 3.2 oz (121.2 kg)     Other studies reviewed: Additional studies/records reviewed today include: summarized above  ASSESSMENT AND PLAN:  Persistent Afib CHA2DS2Vasc is 2, on Eliquis, *** appropriately dosed ***  NICM Recovered lVEF Felt  to be tachy-mediated and rhythm control important ***  Obesity ***   Disposition: F/u with ***  Current medicines are reviewed at length with the patient today.  The patient did not have any concerns regarding medicines.  Venetia Night, PA-C 12/20/2021 5:08 PM     [place] [id number] [phone] (office)  [phone] (fax)

## 2021-12-23 ENCOUNTER — Ambulatory Visit: Payer: 59 | Admitting: Physician Assistant

## 2021-12-25 ENCOUNTER — Encounter: Payer: Self-pay | Admitting: Physician Assistant

## 2021-12-25 ENCOUNTER — Ambulatory Visit (INDEPENDENT_AMBULATORY_CARE_PROVIDER_SITE_OTHER): Payer: 59 | Admitting: Physician Assistant

## 2021-12-25 VITALS — BP 136/72 | HR 68 | Ht 63.0 in | Wt 255.0 lb

## 2021-12-25 DIAGNOSIS — I4819 Other persistent atrial fibrillation: Secondary | ICD-10-CM | POA: Diagnosis not present

## 2021-12-25 DIAGNOSIS — I428 Other cardiomyopathies: Secondary | ICD-10-CM

## 2021-12-25 NOTE — Patient Instructions (Signed)
Medication Instructions:    Your physician recommends that you continue on your current medications as directed. Please refer to the Current Medication list given to you today.  *If you need a refill on your cardiac medications before your next appointment, please call your pharmacy*   Lab Work: [place]    If you have labs (blood work) drawn today and your tests are completely normal, you will receive your results only by: [place] (if you have MyChart) OR A paper copy in the mail If you have any lab test that is abnormal or we need to change your treatment, we will call you to review the results.   Testing/Procedures: NONE ORDERED  TODAY   Follow-Up: At [facility], you and your health needs are our priority.  As part of our continuing mission to provide you with exceptional heart care, we have created designated Provider Care Teams.  These Care Teams include your primary Cardiologist (physician) and Advanced Practice Providers (APPs -  Physician Assistants and Nurse Practitioners) who all work together to provide you with the care you need, when you need it.  We recommend signing up for the patient portal called "MyChart".  Sign up information is provided on this After Visit Summary.  MyChart is used to connect with patients for Virtual Visits (Telemedicine).  Patients are able to view lab/test results, encounter notes, upcoming appointments, etc.  Non-urgent messages can be sent to your provider as well.   To learn more about what you can do with MyChart, go to [email or web address].    Your next appointment:   4 month(s)  The format for your next appointment:   In Person  Provider:   Tommye Standard, PA-C    Other Instructions   Important Information About Sugar

## 2021-12-25 NOTE — Progress Notes (Signed)
Cardiology Office Note Date:  12/25/2021  Patient ID:  Christine Oneal, DOB 1971-03-26, MRN [id number] PCP:  Maximiano Coss, NP  Cardiologist:  Dr. Haroldine Laws Electrophysiologist: Dr. Quentin Ore   Chief Complaint:  4 mo  History of Present Illness: Christine Oneal is a 51 y.o. female with history of HTN, chronic CHF, NICM (suspect tachy-mediated with recovered LVEF), hypercalcemia, AFib  She comes today to be seen for Dr. Quentin Ore, seen last by him Feb 2023.  Discussed that given her CM and AFib amiodarone had been used for her and DCCV pursued, follow up at the AFib clinic prompted referral to him for further management. AT the time of her visit, rare palpitations, was on a down-titrated amio dose. She was working on weight loss. Planned to keep amiodarone during her weight loss journey with a goal towards Afib ablation (with a goal BMI for the procedure more towards 40).  She was interested in referral to weight loss clinic. Planned for a 4 mo visit to see how she was doing and revisit ablation with him perhaps pending BMI  She saw Dr. Haroldine Laws 09/09/21, doing well, stairs provoked knee pain and some SOB, otherwise felt well, pending visit to weight loss clinic  TODAY She is doing well. Has lost about 18lbs since she saw Dr. Quentin Ore. Strategy mostly to now has been salt reduction and healthier eating/food choices. No formal exercise, sounds like she has some knee trouble. She does not this she has had Afib. No CP, palpitations or SOB No near syncope or syncope. No bleeding or signs f bleeding  AFib/AAD hx Diagnosed Oct 2022 Amiodarone started Oct 2022   Past Medical History:  Diagnosis Date   Abnormal Pap smear of cervix    Allergy    Anemia    CHF (congestive heart failure) (HCC)    Fibroid    Hypertension    Leukocytosis, unspecified 10/18/2013   Obesity    Plantar fasciitis     Past Surgical History:  Procedure Laterality Date   CARDIOVERSION N/A 05/12/2021    Procedure: CARDIOVERSION;  Surgeon: Jolaine Artist, MD;  Location: [facility];  Service: Cardiovascular;  Laterality: N/A;   CARDIOVERSION N/A 05/29/2021   Procedure: CARDIOVERSION;  Surgeon: Jolaine Artist, MD;  Location: [facility];  Service: Cardiovascular;  Laterality: N/A;   COLPOSCOPY     RIGHT/LEFT HEART CATH AND CORONARY ANGIOGRAPHY N/A 05/11/2021   Procedure: RIGHT/LEFT HEART CATH AND CORONARY ANGIOGRAPHY;  Surgeon: Jolaine Artist, MD;  Location: [facility];  Service: Cardiovascular;  Laterality: N/A;   TEE WITHOUT CARDIOVERSION N/A 05/12/2021   Procedure: TRANSESOPHAGEAL ECHOCARDIOGRAM (TEE);  Surgeon: Jolaine Artist, MD;  Location: [facility] ENDOSCOPY;  Service: Cardiovascular;  Laterality: N/A;   TUBAL LIGATION  2001    Current Outpatient Medications  Medication Sig Dispense Refill   acetaminophen (TYLENOL) 500 MG tablet Take 1,000 mg by mouth every 6 (six) hours as needed for moderate pain or headache.     amiodarone (PACERONE) 200 MG tablet Take 200 mg by mouth daily.     apixaban (ELIQUIS) 5 MG TABS tablet Take 1 tablet (5 mg total) by mouth 2 (two) times daily. 60 tablet 6   azelastine (ASTELIN) 0.1 % nasal spray Place 1 spray into both nostrils 2 (two) times daily. Use in each nostril as directed 30 mL 12   benzonatate (TESSALON) 100 MG capsule Take 1 capsule (100 mg total) by mouth 3 (three) times daily as needed for cough. 20 capsule 0  carvedilol (COREG) 6.25 MG tablet Take 1 tablet (6.25 mg total) by mouth 2 (two) times daily. 90 tablet 6   cholecalciferol (VITAMIN D3) 10 MCG (400 UNIT) TABS tablet Take 1 tablet (400 Units total) by mouth daily. 30 tablet 0   [employer] D3 10 MCG (400 UNIT) CAPS TAKE 1 CAPSULE BY MOUTH EVERY DAY 100 capsule 11   Dextromethorphan-guaiFENesin (CVS MUCUS DM EXTENDED RELEASE) 30-600 MG TB12 TAKE 1 TABLET BY MOUTH TWICE A DAY 20 tablet 0   FARXIGA 10 MG TABS tablet TAKE 1 TABLET BY MOUTH EVERY DAY 30 tablet 3   fluticasone  (FLONASE) 50 MCG/ACT nasal spray Place 2 sprays into both nostrils daily. 16 g 6   hydrALAZINE (APRESOLINE) 50 MG tablet Take 1 tablet (50 mg total) by mouth 3 (three) times daily. 90 tablet 6   hydrocortisone 2.5 % cream Apply topically 2 (two) times daily. 30 g 1   isosorbide mononitrate (IMDUR) 30 MG 24 hr tablet TAKE 1 TABLET BY MOUTH EVERY DAY 90 tablet 1   potassium chloride SA (KLOR-CON M) 20 MEQ tablet Take 1 tablet (20 mEq total) by mouth as needed (Take when taking Torsemide). 30 tablet 3   Probiotic CHEW Chew 2 capsules by mouth daily.     spironolactone (ALDACTONE) 25 MG tablet TAKE 1 TABLET (25 MG TOTAL) BY MOUTH DAILY. 90 tablet 1   torsemide (DEMADEX) 20 MG tablet Take 1 tablet (20 mg total) by mouth as needed (when weight is up or notes swelling). 30 tablet 3   losartan (COZAAR) 50 MG tablet Take 1 tablet (50 mg total) by mouth in the morning and at bedtime. 90 tablet 3   No current facility-administered medications for this visit.    Allergies:   Lisinopril-hydrochlorothiazide and Other   Social History:  The patient  reports that she has never smoked. She has never used smokeless tobacco. She reports that she does not drink alcohol and does not use drugs.   Family History:  The patient's family history includes Cervical cancer in her mother; Diabetes in her sister; Hypertension in her brother, brother, father, and mother; Stroke in her father; Uterine cancer in her maternal grandmother.  ROS:  Please see the history of present illness.    All other systems are reviewed and otherwise negative.   PHYSICAL EXAM:  VS:  BP 136/72   Pulse 68   Ht '5\' 3"'$  (1.6 m)   Wt 255 lb (115.7 kg)   SpO2 98%   BMI 45.17 kg/m  BMI: Body mass index is 45.17 kg/m. Well nourished, well developed, in no acute distress HEENT: normocephalic, atraumatic Neck: no JVD, carotid bruits or masses Cardiac:  RRR; no significant murmurs, no rubs, or gallops Lungs:   CTA b/l, no wheezing, rhonchi or  rales Abd: soft, nontender MS: no deformity or  atrophy Ext:  no edema Skin: warm and dry, no rash Neuro:  No gross deficits appreciated Psych: euthymic mood, full affect   EKG:  not done today  09/09/21: TTE 1. Left ventricular ejection fraction, by estimation, is 50 to 55%. The  left ventricle has low normal function. The left ventricle has no regional  wall motion abnormalities. The left ventricular internal cavity size was  mildly dilated. Left ventricular  diastolic parameters were normal.   2. Right ventricular systolic function is normal. The right ventricular  size is normal.   3. Left atrial size was moderately dilated.   4. The mitral valve is abnormal. Mild mitral valve  regurgitation. No  evidence of mitral stenosis.   5. The aortic valve is tricuspid. Aortic valve regurgitation is not  visualized. No aortic stenosis is present.   6. The inferior vena cava is normal in size with greater than 50%  respiratory variability, suggesting right atrial pressure of 3 mmHg.   05/12/21: TEE  1. Left ventricular ejection fraction, by estimation, is 25 to 30%. The  left ventricle has severely decreased function. The left ventricle  demonstrates global hypokinesis.   2. Right ventricular systolic function is normal. The right ventricular  size is normal.   3. Left atrial size was moderately dilated. No left atrial/left atrial  appendage thrombus was detected.   4. Right atrial size was moderately dilated.   5. The mitral valve is normal in structure. Mild mitral valve  regurgitation.   6. The aortic valve is normal in structure. Aortic valve regurgitation is  trivial.   7. There is mild (Grade II) plaque involving the descending aorta.   8. Evidence of atrial level shunting detected by color flow Doppler.  There is a small patent foramen ovale.    05/04/21: TTE 1. With Definity contrast, the very large papilarry muscles are  visualized. The apex is akinetic. There is possible  early thrombus  formmation in the apex. . Left ventricular ejection fraction, by  estimation, is <20%. The left ventricle has severely  decreased function. The left ventricle demonstrates global hypokinesis.  The left ventricular internal cavity size was moderately dilated. There is  moderate concentric left ventricular hypertrophy. Left ventricular  diastolic function could not be  evaluated.   2. Right ventricular systolic function is moderately reduced. The right  ventricular size is moderately enlarged.   3. Left atrial size was mildly dilated.   4. Right atrial size was mildly dilated.   5. The mitral valve is grossly normal. Severe mitral valve regurgitation.   6. Tricuspid valve regurgitation is mild to moderate.   7. The aortic valve is grossly normal. Aortic valve regurgitation is  mild. No aortic stenosis is present.    05/11/2021: R/LHC Findings:   Ao = 156/89 (112) LV =135/7 RA = 2 RV = 27/6 PA = 31/8 (20) PCW = 7 Fick cardiac output/index = 5.6/2.5 PVR = 2.2 WU FA sat = 99% PA sat = 67%. 69% SVC sat = 72%   Assessment: 1. Normal coronary arteries 2. 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 Well-compensated hemodynamics after large-volume diuresis   Plan/Discussion:   Stop IV lasix. Plan TEE & DC-CV tomorrow.   Recent Labs: 05/29/2021: Hemoglobin 10.1; Platelets 390 08/25/2021: TSH 2.050 09/09/2021: ALT 25; B Natriuretic Peptide 30.6 11/24/2021: BUN 25; Creatinine, Ser 1.44; Magnesium 2.2; Potassium 4.1; Sodium 139  05/25/2021: Cholesterol 193; HDL 47.60; LDL Cholesterol 125; Total CHOL/HDL Ratio 4; Triglycerides 102.0; VLDL 20.4   CrCl cannot be calculated (Patient's most recent lab result is older than the maximum 21 days allowed.).   Wt Readings from Last 3 Encounters:  12/25/21 255 lb (115.7 kg)  11/24/21 261 lb (118.4 kg)  11/06/21 263 lb 9.6 oz (119.6 kg)     Other studies reviewed: Additional studies/records reviewed  today include: summarized above  ASSESSMENT AND PLAN:  Persistent Afib CHA2DS2Vasc is 2, on Eliquis,  appropriately dosed None by symptoms  She denies sleep apnea No ETOH We discussed today strategy with amiodarone is a short term one, and rational for that. Discussed importance of ongoing weight loss both  for her general heath, AFib management, and procedural benefits  Amio labs are up to date  NICM Recovered lVEF Felt to be tachy-mediated and rhythm control important No symptoms or exam findings of volume OL  Obesity Discussed at length weight loss strategies and efforts   Disposition: F/u with Dr. Sophronia Simas team inn 4 mo again, sooner if needed  Current medicines are reviewed at length with the patient today.  The patient did not have any concerns regarding medicines.  Venetia Night, PA-C 12/25/2021 2:07 PM     [place] [address] [place] [address] [phone] (office)  [phone] (fax)

## 2022-01-11 ENCOUNTER — Other Ambulatory Visit: Payer: Self-pay | Admitting: Registered Nurse

## 2022-01-13 ENCOUNTER — Other Ambulatory Visit (HOSPITAL_COMMUNITY): Payer: Self-pay | Admitting: Physician Assistant

## 2022-01-13 ENCOUNTER — Encounter (HOSPITAL_COMMUNITY): Payer: 59

## 2022-01-13 ENCOUNTER — Other Ambulatory Visit (HOSPITAL_COMMUNITY): Payer: Self-pay | Admitting: *Deleted

## 2022-01-13 DIAGNOSIS — I4819 Other persistent atrial fibrillation: Secondary | ICD-10-CM

## 2022-01-13 MED ORDER — AMIODARONE HCL 200 MG PO TABS: 200.0000 mg | ORAL_TABLET | Freq: Every day | ORAL | 3 refills | Status: DC

## 2022-01-13 MED ORDER — LOSARTAN POTASSIUM 50 MG PO TABS: 50.0000 mg | ORAL_TABLET | Freq: Two times a day (BID) | ORAL | 3 refills | Status: DC

## 2022-01-14 MED ORDER — AMIODARONE HCL 200 MG PO TABS: 200.0000 mg | ORAL_TABLET | Freq: Every day | ORAL | 1 refills | Status: DC

## 2022-01-14 NOTE — Addendum Note (Signed)
Addended by: Sivan Cuello, Sharlot Gowda on: 01/14/2022 03:36 PM   Modules accepted: Orders

## 2022-01-20 ENCOUNTER — Telehealth: Payer: Self-pay

## 2022-01-20 MED ORDER — VITAMIN D3 25 MCG (1000 UT) PO CAPS: 1000.0000 [IU] | ORAL_CAPSULE | Freq: Every day | ORAL | 3 refills | Status: DC

## 2022-01-20 NOTE — Telephone Encounter (Signed)
Patient would like a script for the Vitamin D 47mg.

## 2022-02-05 ENCOUNTER — Encounter (HOSPITAL_COMMUNITY): Payer: 59

## 2022-02-15 ENCOUNTER — Telehealth (HOSPITAL_COMMUNITY): Payer: Self-pay | Admitting: Pharmacy Technician

## 2022-02-15 ENCOUNTER — Other Ambulatory Visit (HOSPITAL_COMMUNITY): Payer: Self-pay

## 2022-02-15 ENCOUNTER — Other Ambulatory Visit (HOSPITAL_COMMUNITY): Payer: Self-pay | Admitting: Physician Assistant

## 2022-02-15 NOTE — Telephone Encounter (Signed)
Advanced Heart Failure Patient Advocate Encounter  Prior Authorization for Eliquis has been submitted and approved.    PA# [id number]  Effective dates: 02/15/22 through 02/15/23  Charlann Boxer, CPhT

## 2022-02-17 ENCOUNTER — Encounter (INDEPENDENT_AMBULATORY_CARE_PROVIDER_SITE_OTHER): Payer: Self-pay

## 2022-02-18 ENCOUNTER — Ambulatory Visit: Payer: 59 | Admitting: Nurse Practitioner

## 2022-02-19 ENCOUNTER — Telehealth: Payer: Self-pay | Admitting: Registered Nurse

## 2022-02-19 ENCOUNTER — Encounter: Payer: Self-pay | Admitting: Nurse Practitioner

## 2022-02-19 ENCOUNTER — Ambulatory Visit (INDEPENDENT_AMBULATORY_CARE_PROVIDER_SITE_OTHER): Payer: Commercial Managed Care - HMO | Admitting: Nurse Practitioner

## 2022-02-19 VITALS — BP 120/74 | HR 66 | Temp 97.4°F | Wt 256.2 lb

## 2022-02-19 DIAGNOSIS — K649 Unspecified hemorrhoids: Secondary | ICD-10-CM | POA: Diagnosis not present

## 2022-02-19 MED ORDER — HYDROCORTISONE ACETATE 25 MG RE SUPP: 25.0000 mg | Freq: Two times a day (BID) | RECTAL | 0 refills | Status: DC

## 2022-02-19 NOTE — Assessment & Plan Note (Signed)
She has a history of hemorrhoids and is having a flareup of symptoms.  We will have her start Anusol suppositories twice a day for 6 days and increase her MiraLAX to daily for 7 days.  Encouraged her to drink plenty of fluids.  Discussed sitz bath's that can be done for comfort.  Follow-up if symptoms worsen or do not improve.

## 2022-02-19 NOTE — Telephone Encounter (Signed)
Pt is a no show for 02/18/22 with [facility] for a Same Day, I sent a no show letter.

## 2022-02-19 NOTE — Progress Notes (Signed)
   Acute Office Visit  Subjective:     Patient ID: Christine Oneal, female    DOB: 26-Feb-1971, 51 y.o.   MRN: [id number]  Chief Complaint  Patient presents with   Hemorrhoids    Pt c/o bleeding from hemorrhoids w/ pain during bowl movements x1 wk     HPI Patient is in today for hemorrhoids and bleeding during bowel movements.  She has a history of hemorrhoids that flareup intermittently.  She has been noticing rectal pain and bleeding for about a week with bowel movements. She has noticed increase in constipation. She takes miralax as needed, sometimes 4 times a week. She has tried hemorrhoid ointment over-the-counter which did not help with her symptoms.  ROS See pertinent positives and negatives per HPI.     Objective:    BP 120/74   Pulse 66   Temp (!) 97.4 F (36.3 C) (Temporal)   Wt 256 lb 3.2 oz (116.2 kg)   SpO2 99%   BMI 45.38 kg/m    Physical Exam Vitals and nursing note reviewed.  Constitutional:      General: She is not in acute distress.    Appearance: Normal appearance.  HENT:     Head: Normocephalic.  Eyes:     Conjunctiva/sclera: Conjunctivae normal.  Cardiovascular:     Rate and Rhythm: Normal rate and regular rhythm.     Pulses: Normal pulses.     Heart sounds: Normal heart sounds.  Pulmonary:     Effort: Pulmonary effort is normal.     Breath sounds: Normal breath sounds.  Musculoskeletal:     Cervical back: Normal range of motion.  Skin:    General: Skin is warm.  Neurological:     General: No focal deficit present.     Mental Status: She is alert and oriented to person, place, and time.  Psychiatric:        Mood and Affect: Mood normal.        Behavior: Behavior normal.        Thought Content: Thought content normal.        Judgment: Judgment normal.       Assessment & Plan:   Problem List Items Addressed This Visit       Cardiovascular and Mediastinum   Hemorrhoids - Primary    She has a history of hemorrhoids and is having a  flareup of symptoms.  We will have her start Anusol suppositories twice a day for 6 days and increase her MiraLAX to daily for 7 days.  Encouraged her to drink plenty of fluids.  Discussed sitz bath's that can be done for comfort.  Follow-up if symptoms worsen or do not improve.       Meds ordered this encounter  Medications   hydrocortisone (ANUSOL-HC) 25 MG suppository    Sig: Place 1 suppository (25 mg total) rectally 2 (two) times daily.    Dispense:  12 suppository    Refill:  0    Return if symptoms worsen or fail to improve.  Charyl Dancer, NP

## 2022-02-19 NOTE — Patient Instructions (Signed)
It was great to see you!  Start annusol suppository twice a day for 6 days.   Increase your miralax to daily for 7 days, then you can go back to as needed.   You can do sitz baths as needed for the discomfort  Let's follow-up if your symptoms worsen or don't improve.   Take care,  Vance Peper, NP

## 2022-02-24 NOTE — Telephone Encounter (Signed)
No show for acute visit ([facility] pt TOC scheduled with Esm[facility]) - generated fee

## 2022-03-01 ENCOUNTER — Other Ambulatory Visit (HOSPITAL_COMMUNITY): Payer: Self-pay | Admitting: Family Medicine

## 2022-03-01 ENCOUNTER — Other Ambulatory Visit (HOSPITAL_COMMUNITY): Payer: Self-pay | Admitting: Physician Assistant

## 2022-03-01 NOTE — Progress Notes (Incomplete)
ADVANCED HF CLINIC NOTE  PCP: Maximiano Coss, NP Primary Cardiologist: Dr. Haroldine Laws  HPI: Christine Oneal is a 51 y.o. 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   Admitted from 05/03/21- 05/14/21 w/ new atrial fibrillation and CHF. She was admitted under Cardiology service and AHF consulted. D/t concern for low-output HF she was started on IV milrinone. Echo with LVEF < 20%, moderate LVH, moderately reduced RV, severe MR. 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 R/LHC 10/31 with normal coronary arteries, RA 2, PA 31/8, Fick CI 2.5, PA sat 67%. Diuresed with IV lasix then transitioned to po torsemide, weight down total of 31 lb. D/C weight 287 lb.  Initiated GDMT with spiro, losartan, imdur/hydralazine (no bidil d/t cost), farxiga and digoxin.     Unsuccessful DCCV x 3 shocks 11/22. IV amio switched to po 200 mg bid. Anticoagulated with Eliquis.    Pt had post hospital f/u w/ her PCP on 11/14 and complained of constipation and palpitations. Labs showed severe hypercalcemia and AKI secondary to overdiuresis as well as multiple medications.  Ca 13.9. SCr 1.65 (baseline 0.9). Given IV hydration and IV pamidronate. Referred to the ED and was admitted under hospitalist service. Scr gradually improved to 1.20. Restarted on spiro, losartan and torsemide prior to discharge. Remained in AF with RVR on admit, underwernt DCCV on 05/29/21 with brief return to NSR then back to AF. Back in NSR on day of discharge.  Today she returns for HF follow up. Doing well. Able to do all ADLs and go to store without problem. If she goes up steps knees hurt and mild SOB. No CP. Saw Dr. Quentin Ore 08/25/21. Felt not great candidate for ablation due to BMI. Recommended continue amio. Would reconsider ablation if BMI < 40.   Echo today 09/09/21 EF 50-55% Personally reviewed   Cardiac Studies: - Echo (10/22): EF < 20%, RV  moderately down, severe MR, mild to mod TR - TEE in (11/22): EF 25-30%, normal RV, mild MR.    - RHC (10/22):  Findings: Ao = 156/89 (112) LV =135/7 RA = 2 RV = 27/6 PA = 31/8 (20) PCW = 7 Fick cardiac output/index = 5.6/2.5 PVR = 2.2 WU FA sat = 99% PA sat = 67%. 69% SVC sat = 72%   Assessment: 1. Normal coronary arteries 2. 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 Well-compensated hemodynamics after large-volume diuresis  ROS: All systems negative except as listed in HPI, PMH and Problem List.  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 Three live at home with her.   Social Determinants of Health   Financial Resource Strain:  Not on file  Food Insecurity: No Food Insecurity (05/31/2021)   Hunger Vital Sign    Worried About Running Out of Food in the Last Year: Never true    Ran Out of Food in the Last Year: Never true  Transportation Needs: No Transportation Needs (05/31/2021)   PRAPARE - Hydrologist (Medical): No    Lack of Transportation (Non-Medical): No  Physical Activity: Not on file  Stress: Not on file  Social Connections: Not on file  Intimate Partner Violence: Not on file    FH:  Family History  Problem Relation Age of Onset   Hypertension Mother    Cervical cancer Mother    Hypertension Father    Stroke Father    Uterine  cancer Maternal Grandmother    Diabetes Sister    Hypertension Brother    Hypertension Brother     Past Medical History:  Diagnosis Date   Abnormal Pap smear of cervix    Allergy    Anemia    CHF (congestive heart failure) (HCC)    Fibroid    Hypertension    Leukocytosis, unspecified 10/18/2013   Obesity    Plantar fasciitis     Current Outpatient Medications  Medication Sig Dispense Refill   acetaminophen (TYLENOL) 500 MG tablet Take 1,000 mg by mouth every 6 (six) hours as needed for moderate pain or headache.     amiodarone (PACERONE) 200 MG tablet Take 1 tablet (200 mg total) by mouth daily. 90 tablet 1   amLODipine (NORVASC) 10 MG tablet Take 1 tablet by mouth daily.     apixaban (ELIQUIS) 5 MG TABS tablet Take 1 tablet (5 mg total) by mouth 2 (two) times daily. 60 tablet 6   atenolol (TENORMIN) 50 MG tablet Take 1 tablet by mouth daily.     azelastine (ASTELIN) 0.1 % nasal spray Place 1 spray into both nostrils 2 (two) times daily. Use in each nostril as directed 30 mL 12   benzonatate (TESSALON) 100 MG capsule Take 1 capsule (100 mg total) by mouth 3 (three) times daily as needed for cough. 20 capsule 0   carvedilol (COREG) 6.25 MG tablet Take 1 tablet (6.25 mg total) by mouth 2 (two) times daily. 90 tablet 6   Cholecalciferol (VITAMIN D3) 25 MCG (1000 UT) CAPS Take 1 capsule (1,000 Units total) by mouth daily. 90 capsule 3   Dextromethorphan-guaiFENesin ([employer] MUCUS DM EXTENDED RELEASE) 30-600 MG TB12 TAKE 1 TABLET BY MOUTH TWICE A DAY 20 tablet 0   FARXIGA 10 MG TABS tablet TAKE 1 TABLET BY MOUTH EVERY DAY 30 tablet 3   fluticasone (FLONASE) 50 MCG/ACT nasal spray Place 2 sprays into both nostrils daily. 16 g 6   hydrALAZINE (APRESOLINE) 50 MG tablet Take 1 tablet (50 mg total) by mouth 3 (three) times daily. 90 tablet 6   hydrocortisone (ANUSOL-HC) 25 MG suppository Place 1 suppository (25 mg total) rectally 2 (two) times daily. 12 suppository 0   hydrocortisone 2.5 % cream  Apply topically 2 (two) times daily. 30 g 1   isosorbide mononitrate (IMDUR) 30 MG 24 hr tablet TAKE 1 TABLET BY MOUTH EVERY DAY 90 tablet 1   losartan (COZAAR) 50 MG tablet Take 1 tablet (50 mg total) by mouth in the morning and at bedtime. 90 tablet 3   meloxicam (MOBIC) 15 MG tablet 1 tablet by mouth as needed     potassium chloride SA (KLOR-CON M) 20 MEQ tablet Take 1 tablet (  20 mEq total) by mouth as needed (Take when taking Torsemide). 30 tablet 3   Probiotic CHEW Chew 2 capsules by mouth daily.     spironolactone (ALDACTONE) 25 MG tablet TAKE 1 TABLET (25 MG TOTAL) BY MOUTH DAILY. 90 tablet 1   torsemide (DEMADEX) 20 MG tablet Take 1 tablet (20 mg total) by mouth as needed (when weight is up or notes swelling). 30 tablet 3   No current facility-administered medications for this visit.   There were no vitals taken for this visit.  Wt Readings from Last 3 Encounters:  02/19/22 116.2 kg (256 lb 3.2 oz)  12/25/21 115.7 kg (255 lb)  11/24/21 118.4 kg (261 lb)   PHYSICAL EXAM: General:  Well appearing. No resp difficulty HEENT: normal Neck: supple. no JVD. Carotids 2+ bilat; no bruits. No lymphadenopathy or thryomegaly appreciated. Cor: PMI nondisplaced. Regular rate & rhythm. No rubs, gallops or murmurs. Lungs: clear Abdomen: obesity, soft, nontender, nondistended. No hepatosplenomegaly. No bruits or masses. Good bowel sounds. Extremities: no cyanosis, clubbing, rash, edema Neuro: alert & orientedx3, cranial nerves grossly intact. moves all 4 extremities w/o difficulty. Affect pleasant   ECG: NSR 60 Personally reviewed   ASSESSMENT & PLAN:   1. Chronic systolic CHF with recovered EF:  - Nonischemic CMP,  tachy-mediated.   - Echo (10/22): EF < 20%, RV moderately down, severe MR, mild to mod TR - R/LHC (05/11/21): normal cors, RA 2, PA 31/8, Fick CI 2.5, PA sat 67% - TEE in (11/22): EF 25-30%, normal RV, mild MR.   - Echo today 09/09/21 EF 50-55% Personally reviewed - NYHA II.  Volume looks good today. - Continue torsemide 20 mg daily PRN. - BP up. Increase hydralazine to 50 mg tid + Imdur 30 mg daily. - Continue Farxiga 10 mg daily. - Continue spiro 25 mg daily. - Continue losartan 50 mg bid (had angioedema with Entresto) - Continue carvedilol 6.25 mg bid.  2. Atrial fibrillation with RVR/AFL:  - Failed DCCV 10/22.  - Underwent attempted DC-CV 05/29/21 Brief return to NSR then back to AF.  - Converted with amio 06/01/21 - Saw Dr. Quentin Ore 08/25/21. Felt not great candidate for ablation due to BMI. Recommended continue amio. Would reconsider ablation if BMI < 40.  - Remains in NSR today by ECG - CHA2DS2VASc score of 3. - Continue Eliquis.   - Continue amiodarone 200 mg daily for now (given young age would like to avoid long-term exposure to [facility], if possible). - Saw Dr. Quentin Ore 08/25/21. Felt not great candidate for ablation due to BMI. Recommended continue amio. Would reconsider ablation if BMI < 40.   3. H/o Hypercalcemia:  - Up to 13.9 prompting admission 11/22.  - Received IV fluids and IV pamidronate. - Not on thiazide diuretic. - Resolved on recent labs. - Planning to see endocrine for further work up, given # to Dr. Quin Hoop office to follow up  4. HTN: - BP up. Increase hydralazine - Continue current medications.  5. Snoring - HST looked ok but pending repeat in hospital  6. Obesity - There is no height or weight on file to calculate BMI. - Has referral for [facility]  Maricela Bo Luverne MD  03/01/22

## 2022-03-02 ENCOUNTER — Ambulatory Visit (HOSPITAL_COMMUNITY)
Admission: RE | Admit: 2022-03-02 | Discharge: 2022-03-02 | Disposition: A | Discharge status: Home / Self Care | Payer: Commercial Managed Care - HMO | Source: Ambulatory Visit | Attending: Family Medicine | Admitting: Family Medicine

## 2022-03-02 ENCOUNTER — Encounter (HOSPITAL_COMMUNITY): Payer: Self-pay

## 2022-03-02 ENCOUNTER — Telehealth: Payer: Self-pay | Admitting: Pharmacist

## 2022-03-02 VITALS — BP 130/78 | HR 68 | Wt 255.8 lb

## 2022-03-02 DIAGNOSIS — Z79899 Other long term (current) drug therapy: Secondary | ICD-10-CM | POA: Diagnosis not present

## 2022-03-02 DIAGNOSIS — I5022 Chronic systolic (congestive) heart failure: Secondary | ICD-10-CM | POA: Diagnosis not present

## 2022-03-02 DIAGNOSIS — I110 Hypertensive heart disease with heart failure: Secondary | ICD-10-CM | POA: Insufficient documentation

## 2022-03-02 DIAGNOSIS — R0683 Snoring: Secondary | ICD-10-CM | POA: Diagnosis not present

## 2022-03-02 DIAGNOSIS — R000 Tachycardia, unspecified: Secondary | ICD-10-CM | POA: Insufficient documentation

## 2022-03-02 DIAGNOSIS — Z6841 Body Mass Index (BMI) 40.0 and over, adult: Secondary | ICD-10-CM | POA: Insufficient documentation

## 2022-03-02 DIAGNOSIS — E669 Obesity, unspecified: Secondary | ICD-10-CM | POA: Diagnosis not present

## 2022-03-02 DIAGNOSIS — I428 Other cardiomyopathies: Secondary | ICD-10-CM | POA: Diagnosis not present

## 2022-03-02 DIAGNOSIS — I10 Essential (primary) hypertension: Secondary | ICD-10-CM

## 2022-03-02 DIAGNOSIS — Z7984 Long term (current) use of oral hypoglycemic drugs: Secondary | ICD-10-CM | POA: Insufficient documentation

## 2022-03-02 DIAGNOSIS — E210 Primary hyperparathyroidism: Secondary | ICD-10-CM | POA: Diagnosis not present

## 2022-03-02 DIAGNOSIS — Z7901 Long term (current) use of anticoagulants: Secondary | ICD-10-CM | POA: Insufficient documentation

## 2022-03-02 DIAGNOSIS — I4819 Other persistent atrial fibrillation: Secondary | ICD-10-CM | POA: Insufficient documentation

## 2022-03-02 LAB — BASIC METABOLIC PANEL
Anion gap: 6 (ref 5–15)
BUN: 25 mg/dL — ABNORMAL HIGH (ref 6–20)
CO2: 25 mmol/L (ref 22–32)
Calcium: 10.1 mg/dL (ref 8.9–10.3)
Chloride: 107 mmol/L (ref 98–111)
Creatinine, Ser: 1.45 mg/dL — ABNORMAL HIGH (ref 0.44–1.00)
GFR, Estimated: 43 mL/min — ABNORMAL LOW (ref >60)
Glucose, Bld: 88 mg/dL (ref 70–99)
Potassium: 4.1 mmol/L (ref 3.5–5.1)
Sodium: 138 mmol/L (ref 135–145)

## 2022-03-02 NOTE — Telephone Encounter (Signed)
Pt referred to PharmD to start [facility] therapy for weight loss. She does not have DM so her insurance will not cover Ozempic or Mounjaro. Mancel Parsons is still on national backorder and her plan does not cover med well once it is available (copay > $1,000/month).   Called pt and left message advising her that her insurance will not cover [place] therapy for weight loss.

## 2022-03-02 NOTE — Patient Instructions (Addendum)
It was great to see you today! No medication changes are needed at this time.  Labs today We will only contact you if something comes back abnormal or we need to make some changes. Otherwise no news is good news!  You have been referred to [facility] Team -they will be in contact regarding an appointment  Please follow up with [facility] regarding your surgical referral to Dr Harlow Asa Phone: [phone] Address: [address], [place], [place] [address]  Your physician recommends that you schedule a follow-up appointment in: 3 months with Dr Haroldine Laws  Do the following things EVERYDAY: Weigh yourself in the morning before breakfast. Write it down and keep it in a log. Take your medicines as prescribed Eat low salt foods--Limit salt (sodium) to 2000 mg per day.  Stay as active as you can everyday Limit all fluids for the day to less than 2 liters   At the [facility], you and your health needs are our priority. As part of our continuing mission to provide you with exceptional heart care, we have created designated Provider Care Teams. These Care Teams include your primary Cardiologist (physician) and Advanced Practice Providers (APPs- Physician Assistants and Nurse Practitioners) who all work together to provide you with the care you need, when you need it.   You may see any of the following providers on your designated Care Team at your next follow up: Dr Glori Bickers Dr Haynes Kerns, NP Lyda Jester, [place] [facility] [place], [place] Audry Riles, PharmD   Please be sure to bring in all your medications bottles to every appointment.   If you have any questions or concerns before your next appointment please send [place] a message through [place] or call our office at [phone].    TO LEAVE A MESSAGE FOR THE NURSE SELECT OPTION 2, PLEASE LEAVE A MESSAGE INCLUDING: YOUR NAME DATE OF BIRTH CALL BACK  NUMBER REASON FOR CALL**this is important as we prioritize the call backs  YOU WILL RECEIVE A CALL BACK THE SAME DAY AS LONG AS YOU CALL BEFORE 4:00 PM

## 2022-03-02 NOTE — Addendum Note (Signed)
Encounter addended by: Rafael Bihari, FNP on: 03/02/2022 4:12 PM  Actions taken: Clinical Note Signed

## 2022-03-29 ENCOUNTER — Other Ambulatory Visit (HOSPITAL_COMMUNITY): Payer: Self-pay | Admitting: Physician Assistant

## 2022-04-05 ENCOUNTER — Ambulatory Visit: Payer: 59 | Admitting: Nurse Practitioner

## 2022-04-08 ENCOUNTER — Ambulatory Visit (INDEPENDENT_AMBULATORY_CARE_PROVIDER_SITE_OTHER): Payer: Commercial Managed Care - HMO | Admitting: Nurse Practitioner

## 2022-04-08 ENCOUNTER — Ambulatory Visit: Payer: 59 | Admitting: Nurse Practitioner

## 2022-04-08 ENCOUNTER — Encounter: Payer: Self-pay | Admitting: Nurse Practitioner

## 2022-04-08 VITALS — BP 124/70 | HR 61 | Temp 96.7°F | Wt 251.6 lb

## 2022-04-08 DIAGNOSIS — M79671 Pain in right foot: Secondary | ICD-10-CM | POA: Diagnosis not present

## 2022-04-08 DIAGNOSIS — M79672 Pain in left foot: Secondary | ICD-10-CM | POA: Diagnosis not present

## 2022-04-08 DIAGNOSIS — K649 Unspecified hemorrhoids: Secondary | ICD-10-CM | POA: Diagnosis not present

## 2022-04-08 MED ORDER — HYDROCORTISONE ACETATE 25 MG RE SUPP: 25.0000 mg | Freq: Two times a day (BID) | RECTAL | 1 refills | Status: DC

## 2022-04-08 NOTE — Assessment & Plan Note (Signed)
Symptoms are improving, she just has a little bit of slight bleeding intermittently right now.  We will refill her Anusol suppository to use twice a day as needed.  Continue using MiraLAX daily as needed for constipation and drinking plenty of fluids.

## 2022-04-08 NOTE — Patient Instructions (Signed)
It was great to see you!  Continue taking the miralax daily and drinking plenty of water.  Keep elevating your feet if they swell or using ice and tylenol to help with the pain.   Let's follow-up if your symptoms worsen or don't improve.   Take care,  Vance Peper, NP

## 2022-04-08 NOTE — Progress Notes (Signed)
Acute Office Visit  Subjective:     Patient ID: Christine Oneal, female    DOB: 11/27/1970, 51 y.o.   MRN: [id number]  Chief Complaint  Patient presents with   Hemorrhoids    4 wk f/u hemorrhoids, pt states bleeding has gotten better. Pt c/o LT foot pain/swelling due to recent injury.     HPI Patient is in today for follow-up on hemorrhoids and foot pain.   She states that her rectal bleeding and hemorrhoids have improved. She found that leafy greens are helping her constipation. Taking miralax daily as needed. She would like a refill on her annsuol suppository.   She was helping her daughter move and part of the u-haul ramp fell on her left foot. This happened about 3 weeks ago. She noticed swelling. She states that the pain and swelling have improved. She states that standing for long periods of time makes it worse. Elevated her foot helps.   She tripped and fell and hurt her right foot near the toes 1 week ago. She states that the pain has improved on this as well. She did notice some bruising. She has been taking tylenol as needed for pain.   ROS See pertinent positives and negatives per HPI.     Objective:    BP 124/70   Pulse 61   Temp (!) 96.7 F (35.9 C) (Temporal)   Wt 251 lb 9.6 oz (114.1 kg)   SpO2 99%   BMI 44.57 kg/m    Physical Exam Vitals and nursing note reviewed.  Constitutional:      General: She is not in acute distress.    Appearance: Normal appearance.  HENT:     Head: Normocephalic.  Eyes:     Conjunctiva/sclera: Conjunctivae normal.  Pulmonary:     Effort: Pulmonary effort is normal.  Musculoskeletal:     Cervical back: Normal range of motion.  Skin:    General: Skin is warm.  Neurological:     General: No focal deficit present.     Mental Status: She is alert and oriented to person, place, and time.  Psychiatric:        Mood and Affect: Mood normal.        Behavior: Behavior normal.        Thought Content: Thought content normal.         Judgment: Judgment normal.       Assessment & Plan:   Problem List Items Addressed This Visit       Cardiovascular and Mediastinum   Hemorrhoids - Primary    Symptoms are improving, she just has a little bit of slight bleeding intermittently right now.  We will refill her Anusol suppository to use twice a day as needed.  Continue using MiraLAX daily as needed for constipation and drinking plenty of fluids.      Other Visit Diagnoses     Left foot pain       After u-haul ramp fell on foot 3 weeks ago. Swelling improved. Continue elevated foot, taking tylenol and using ice as needed.    Right foot pain       After fall. Some bruising noted near pain, however no tenderness with palpation. Continue elevated, using ice and tylenol as needed.        Meds ordered this encounter  Medications   DISCONTD: hydrocortisone (ANUSOL-HC) 25 MG suppository    Sig: Place 1 suppository (25 mg total) rectally 2 (two) times daily.    Dispense:  12 suppository    Refill:  1   hydrocortisone (ANUSOL-HC) 25 MG suppository    Sig: Place 1 suppository (25 mg total) rectally 2 (two) times daily.    Dispense:  12 suppository    Refill:  1    Return if symptoms worsen or fail to improve.  Charyl Dancer, NP

## 2022-04-16 ENCOUNTER — Ambulatory Visit (INDEPENDENT_AMBULATORY_CARE_PROVIDER_SITE_OTHER): Payer: Commercial Managed Care - HMO | Admitting: Internal Medicine

## 2022-04-16 ENCOUNTER — Encounter: Payer: Self-pay | Admitting: Internal Medicine

## 2022-04-16 VITALS — BP 116/68 | HR 65 | Temp 98.5°F | Ht 63.0 in | Wt 250.0 lb

## 2022-04-16 DIAGNOSIS — Z1211 Encounter for screening for malignant neoplasm of colon: Secondary | ICD-10-CM | POA: Diagnosis not present

## 2022-04-16 DIAGNOSIS — Z1231 Encounter for screening mammogram for malignant neoplasm of breast: Secondary | ICD-10-CM | POA: Diagnosis not present

## 2022-04-16 DIAGNOSIS — K649 Unspecified hemorrhoids: Secondary | ICD-10-CM

## 2022-04-16 DIAGNOSIS — Z0001 Encounter for general adult medical examination with abnormal findings: Secondary | ICD-10-CM

## 2022-04-16 DIAGNOSIS — I10 Essential (primary) hypertension: Secondary | ICD-10-CM

## 2022-04-16 DIAGNOSIS — I5022 Chronic systolic (congestive) heart failure: Secondary | ICD-10-CM

## 2022-04-16 DIAGNOSIS — Z23 Encounter for immunization: Secondary | ICD-10-CM | POA: Diagnosis not present

## 2022-04-16 MED ORDER — FLUCONAZOLE 150 MG PO TABS: 150.0000 mg | ORAL_TABLET | Freq: Once | ORAL | 0 refills | Status: AC

## 2022-04-16 NOTE — Patient Instructions (Signed)
Think about getting the covid-19 booster shot.

## 2022-04-16 NOTE — Progress Notes (Signed)
   Subjective:   Patient ID: Christine Oneal, female    DOB: 09-25-1970, 51 y.o.   MRN: [id number]  HPI The patient is a 51 YO female coming in for [facility] and physical.  PMH, Bernard, social history reviewed and updated  Review of Systems  Constitutional: Negative.   HENT: Negative.    Eyes: Negative.   Respiratory:  Negative for cough, chest tightness and shortness of breath.   Cardiovascular:  Negative for chest pain, palpitations and leg swelling.  Gastrointestinal:  Negative for abdominal distention, abdominal pain, constipation, diarrhea, nausea and vomiting.  Musculoskeletal: Negative.   Skin: Negative.   Neurological: Negative.   Psychiatric/Behavioral: Negative.      Objective:  Physical Exam Constitutional:      Appearance: She is well-developed.  HENT:     Head: Normocephalic and atraumatic.  Cardiovascular:     Rate and Rhythm: Normal rate and regular rhythm.  Pulmonary:     Effort: Pulmonary effort is normal. No respiratory distress.     Breath sounds: Normal breath sounds. No wheezing or rales.  Abdominal:     General: Bowel sounds are normal. There is no distension.     Palpations: Abdomen is soft.     Tenderness: There is no abdominal tenderness. There is no rebound.  Musculoskeletal:     Cervical back: Normal range of motion.  Skin:    General: Skin is warm and dry.  Neurological:     Mental Status: She is alert and oriented to person, place, and time.     Coordination: Coordination normal.     Vitals:   04/16/22 0854  BP: 116/68  Pulse: 65  Temp: 98.5 F (36.9 C)  SpO2: 99%  Weight: 250 lb (113.4 kg)  Height: '5\' 3"'$  (1.6 m)    Assessment & Plan:  Flu shot given at visit

## 2022-04-17 DIAGNOSIS — Z0001 Encounter for general adult medical examination with abnormal findings: Secondary | ICD-10-CM | POA: Insufficient documentation

## 2022-04-17 NOTE — Assessment & Plan Note (Signed)
Flu shot given. Covid-19 encouraged. Shingrix counseled. Tetanus counseled. Cologuard ordered. Mammogram ordered, pap smear will do next year once heart conditions settled. Counseled about sun safety and mole surveillance. Counseled about the dangers of distracted driving. Given 10 year screening recommendations.

## 2022-04-17 NOTE — Assessment & Plan Note (Signed)
Uses suppositories if needed.

## 2022-04-17 NOTE — Assessment & Plan Note (Signed)
BP at goal on coreg 6.25 mg BID and hydralazine 50 mg TID and imdur 30 mg daily and losartan 50 mg BID and spironolactone 25 mg daily and torsemide prn only. Recent labs so none today. Continue.

## 2022-04-17 NOTE — Assessment & Plan Note (Signed)
Recent EF improved once in sinus not A fib. She is on appropriate treatment and follows up with cardiology.

## 2022-04-17 NOTE — Assessment & Plan Note (Signed)
She is working on weight loss for her health through diet and exercise. Encouraged today. She does not have coverage for GLP-1 as her cardiologist looked into this.

## 2022-05-03 ENCOUNTER — Other Ambulatory Visit: Payer: Self-pay | Admitting: Surgery

## 2022-05-03 ENCOUNTER — Other Ambulatory Visit (HOSPITAL_COMMUNITY): Payer: Self-pay | Admitting: Surgery

## 2022-05-03 DIAGNOSIS — E213 Hyperparathyroidism, unspecified: Secondary | ICD-10-CM

## 2022-05-03 NOTE — Progress Notes (Deleted)
Electrophysiology Office Follow up Visit Note:    Date:  05/03/2022   ID:  Christine Oneal, DOB Mar 29, 1971, MRN [id number]  PCP:  Hoyt Koch, MD  [facility] Cardiologist:  None  [facility] Electrophysiologist:  Vickie Epley, MD    Interval History:    Christine Oneal is a 51 y.o. female who presents for a follow up visit. They were last seen in clinic 12/25/2021 for persistent AF by Renee. I have previously met the patient. She takes amiodarone for rhythm control but the long term plan has previously been to pursue ablation.       Past Medical History:  Diagnosis Date   Abnormal Pap smear of cervix    Allergy    Anemia    CHF (congestive heart failure) (HCC)    Fibroid    Hypertension    Leukocytosis, unspecified 10/18/2013   Obesity    Plantar fasciitis     Past Surgical History:  Procedure Laterality Date   CARDIOVERSION N/A 05/12/2021   Procedure: CARDIOVERSION;  Surgeon: Jolaine Artist, MD;  Location: [facility] ENDOSCOPY;  Service: Cardiovascular;  Laterality: N/A;   CARDIOVERSION N/A 05/29/2021   Procedure: CARDIOVERSION;  Surgeon: Jolaine Artist, MD;  Location: [facility] ENDOSCOPY;  Service: Cardiovascular;  Laterality: N/A;   COLPOSCOPY     RIGHT/LEFT HEART CATH AND CORONARY ANGIOGRAPHY N/A 05/11/2021   Procedure: RIGHT/LEFT HEART CATH AND CORONARY ANGIOGRAPHY;  Surgeon: Jolaine Artist, MD;  Location: [facility];  Service: Cardiovascular;  Laterality: N/A;   TEE WITHOUT CARDIOVERSION N/A 05/12/2021   Procedure: TRANSESOPHAGEAL ECHOCARDIOGRAM (TEE);  Surgeon: Jolaine Artist, MD;  Location: [facility] ENDOSCOPY;  Service: Cardiovascular;  Laterality: N/A;   TUBAL LIGATION  2001    Current Medications: No outpatient medications have been marked as taking for the 05/04/22 encounter (Appointment) with Vickie Epley, MD.     Allergies:   Lisinopril-hydrochlorothiazide and Other   Social History   Socioeconomic History   Marital  status: Divorced    Spouse name: Not on file   Number of children: 4   Years of education: Not on file   Highest education level: Not on file  Occupational History   Occupation: CNA    Employer: Information systems manager   Tobacco Use   Smoking status: Never   Smokeless tobacco: Never  Vaping Use   Vaping Use: Never used  Substance and Sexual Activity   Alcohol use: No   Drug use: No   Sexual activity: Yes    Partners: Male    Birth control/protection: Surgical    Comment: BTL  Other Topics Concern   Not on file  Social History Narrative   Mychele has 4 children. Three live at home with her.   Social Determinants of Health   Financial Resource Strain: Not on file  Food Insecurity: No Food Insecurity (05/31/2021)   Hunger Vital Sign    Worried About Running Out of Food in the Last Year: Never true    Ran Out of Food in the Last Year: Never true  Transportation Needs: No Transportation Needs (05/31/2021)   PRAPARE - Hydrologist (Medical): No    Lack of Transportation (Non-Medical): No  Physical Activity: Not on file  Stress: Not on file  Social Connections: Not on file     Family History: The patient's family history includes Cervical cancer in her mother; Diabetes in her sister; Hypertension in her brother, brother, father, and mother; Stroke in  her father; Uterine cancer in her maternal grandmother.  ROS:   Please see the history of present illness.    All other systems reviewed and are negative.  EKGs/Labs/Other Studies Reviewed:    The following studies were reviewed today:  09/09/2021 echo EF 50 RV normal Mild MR Moderately dilated LA   EKG:  The ekg ordered today demonstrates ***  Recent Labs: 05/29/2021: Hemoglobin 10.1; Platelets 390 08/25/2021: TSH 2.050 09/09/2021: ALT 25; B Natriuretic Peptide 30.6 11/24/2021: Magnesium 2.2 03/02/2022: BUN 25; Creatinine, Ser 1.45; Potassium 4.1; Sodium 138  Recent Lipid Panel    Component Value  Date/Time   CHOL 193 05/25/2021 1414   CHOL 202 (H) 02/05/2020 1417   TRIG 102.0 05/25/2021 1414   HDL 47.60 05/25/2021 1414   HDL 46 02/05/2020 1417   CHOLHDL 4 05/25/2021 1414   VLDL 20.4 05/25/2021 1414   LDLCALC 125 (H) 05/25/2021 1414   LDLCALC 139 (H) 02/05/2020 1417    Physical Exam:    VS:  There were no vitals taken for this visit.    Wt Readings from Last 3 Encounters:  04/16/22 250 lb (113.4 kg)  04/08/22 251 lb 9.6 oz (114.1 kg)  03/02/22 255 lb 12.8 oz (116 kg)     GEN: *** Well nourished, well developed in no acute distress HEENT: Normal NECK: No JVD; No carotid bruits LYMPHATICS: No lymphadenopathy CARDIAC: ***RRR, no murmurs, rubs, gallops RESPIRATORY:  Clear to auscultation without rales, wheezing or rhonchi  ABDOMEN: Soft, non-tender, non-distended MUSCULOSKELETAL:  No edema; No deformity  SKIN: Warm and dry NEUROLOGIC:  Alert and oriented x 3 PSYCHIATRIC:  Normal affect        ASSESSMENT:    1. Chronic systolic CHF (congestive heart failure) (HCC)   2. Persistent atrial fibrillation ([place])   3. Morbid obesity ([place])   4. Encounter for long-term (current) use of high-risk medication    PLAN:    In order of problems listed above:  #chronic systolic heart failure NYHA II. Warm and dry. Rhythm control strategy indicated. Continue current medications  #AF, persistent Rhythm control indicated as above given history of tachy medicated cardiomyopathy  Continue eliquis for stroke ppx.  Continue amiodarone. Check CMP, TSH and FT4 today.  *** Discussed treatment options today for his AF including antiarrhythmic drug therapy and ablation. Discussed risks, recovery and likelihood of success. Discussed potential need for repeat ablation procedures and antiarrhythmic drugs after an initial ablation. They wish to proceed with scheduling.  Risk, benefits, and alternatives to EP study and radiofrequency ablation for afib were also discussed in detail  today. These risks include but are not limited to stroke, bleeding, vascular damage, tamponade, perforation, damage to the esophagus, lungs, and other structures, pulmonary vein stenosis, worsening renal function, and death. The patient understands these risk and wishes to proceed.  We will therefore proceed with catheter ablation at the next available time.  Carto, ICE, anesthesia are requested for the procedure.  Will also obtain CT PV protocol prior to the procedure to exclude LAA thrombus and further evaluate atrial anatomy.    Medication Adjustments/Labs and Tests Ordered: Current medicines are reviewed at length with the patient today.  Concerns regarding medicines are outlined above.  No orders of the defined types were placed in this encounter.  No orders of the defined types were placed in this encounter.    Signed, Lars Mage, MD, [facility], [facility] 05/03/2022 9:33 PM    Electrophysiology [facility]

## 2022-05-04 ENCOUNTER — Ambulatory Visit: Payer: Commercial Managed Care - HMO | Attending: Cardiology | Admitting: Cardiology

## 2022-05-04 ENCOUNTER — Encounter: Payer: Self-pay | Admitting: Cardiology

## 2022-05-04 VITALS — BP 126/71 | HR 80 | Ht 63.0 in | Wt 245.0 lb

## 2022-05-04 DIAGNOSIS — I5022 Chronic systolic (congestive) heart failure: Secondary | ICD-10-CM | POA: Diagnosis not present

## 2022-05-04 DIAGNOSIS — I4819 Other persistent atrial fibrillation: Secondary | ICD-10-CM | POA: Diagnosis not present

## 2022-05-04 DIAGNOSIS — Z79899 Other long term (current) drug therapy: Secondary | ICD-10-CM

## 2022-05-04 NOTE — Patient Instructions (Signed)
Medication Instructions:  None  *If you need a refill on your cardiac medications before your next appointment, please call your pharmacy*   Lab Work: None  If you have labs (blood work) drawn today and your tests are completely normal, you will receive your results only by: [place] (if you have MyChart) OR A paper copy in the mail If you have any lab test that is abnormal or we need to change your treatment, we will call you to review the results.   Testing/Procedures: Your physician has requested that you have cardiac CT. Cardiac computed tomography (CT) is a painless test that uses an x-ray machine to take clear, detailed pictures of your heart. For further information please visit [email or web address]. Please follow instruction sheet as given.  Your physician has recommended that you have an ablation. Catheter ablation is a medical procedure used to treat some cardiac arrhythmias (irregular heartbeats). During catheter ablation, a long, thin, flexible tube is put into a blood vessel in your groin (upper thigh), or neck. This tube is called an ablation catheter. It is then guided to your heart through the blood vessel. Radio frequency waves destroy small areas of heart tissue where abnormal heartbeats may cause an arrhythmia to start. Please see the instruction sheet given to you today.    Follow-Up: At [facility], you and your health needs are our priority.  As part of our continuing mission to provide you with exceptional heart care, we have created designated Provider Care Teams.  These Care Teams include your primary Cardiologist (physician) and Advanced Practice Providers (APPs -  Physician Assistants and Nurse Practitioners) who all work together to provide you with the care you need, when you need it.  We recommend signing up for the patient portal called "MyChart".  Sign up information is provided on this After Visit Summary.  MyChart is used to connect with  patients for Virtual Visits (Telemedicine).  Patients are able to view lab/test results, encounter notes, upcoming appointments, etc.  Non-urgent messages can be sent to your provider as well.   To learn more about what you can do with MyChart, go to [email or web address].    Your next appointment:   The ablation date you picked is Jan 23, preop lab date Jan 2.    Important Information About Sugar

## 2022-05-04 NOTE — Progress Notes (Signed)
Electrophysiology Office Follow up Visit Note:    Date:  05/04/2022   ID:  Christine Oneal, DOB [birth year], MRN [id number]  PCP:  Hoyt Koch, MD  [facility] Cardiologist:  None  [facility] Electrophysiologist:  Vickie Epley, MD    Interval History:    Christine Oneal is a 51 y.o. female who presents for a follow up visit. They were last seen in clinic 12/25/2021 for persistent AF by Christine Oneal. I have previously met the patient. She takes amiodarone for rhythm control but the long term plan has previously been to pursue ablation.  Today, the patient states states that she has been alright, she believes that her rhythm was off last week but has generally been more controlled recently  She has lost 10 pounds since the last visit, she says it's due to diet and staying more active.  She is wondering if ablation is an option for her which we discussed.  She denies any chest pain, shortness of breath, or peripheral edema. No lightheadedness, headaches, syncope, orthopnea, or PND.      Past Medical History:  Diagnosis Date   Abnormal Pap smear of cervix    Allergy    Anemia    CHF (congestive heart failure) (HCC)    Fibroid    Hypertension    Leukocytosis, unspecified 10/18/2013   Obesity    Plantar fasciitis     Past Surgical History:  Procedure Laterality Date   CARDIOVERSION N/A 05/12/2021   Procedure: CARDIOVERSION;  Surgeon: Jolaine Artist, MD;  Location: [facility] ENDOSCOPY;  Service: Cardiovascular;  Laterality: N/A;   CARDIOVERSION N/A 05/29/2021   Procedure: CARDIOVERSION;  Surgeon: Jolaine Artist, MD;  Location: [facility] ENDOSCOPY;  Service: Cardiovascular;  Laterality: N/A;   COLPOSCOPY     RIGHT/LEFT HEART CATH AND CORONARY ANGIOGRAPHY N/A 05/11/2021   Procedure: RIGHT/LEFT HEART CATH AND CORONARY ANGIOGRAPHY;  Surgeon: Jolaine Artist, MD;  Location: [place] CV LAB;  Service: Cardiovascular;  Laterality: N/A;   TEE WITHOUT CARDIOVERSION N/A  05/12/2021   Procedure: TRANSESOPHAGEAL ECHOCARDIOGRAM (TEE);  Surgeon: Jolaine Artist, MD;  Location: [facility] ENDOSCOPY;  Service: Cardiovascular;  Laterality: N/A;   TUBAL LIGATION  2001    Current Medications: Current Meds  Medication Sig   acetaminophen (TYLENOL) 500 MG tablet Take 1,000 mg by mouth every 6 (six) hours as needed for moderate pain or headache.   amiodarone (PACERONE) 200 MG tablet Take 1 tablet (200 mg total) by mouth daily.   apixaban (ELIQUIS) 5 MG TABS tablet Take 1 tablet (5 mg total) by mouth 2 (two) times daily.   azelastine (ASTELIN) 0.1 % nasal spray Place 1 spray into both nostrils 2 (two) times daily. Use in each nostril as directed   benzonatate (TESSALON) 100 MG capsule Take 1 capsule (100 mg total) by mouth 3 (three) times daily as needed for cough.   carvedilol (COREG) 6.25 MG tablet TAKE 1 TABLET BY MOUTH TWICE A DAY   Cholecalciferol (VITAMIN D3) 25 MCG (1000 UT) CAPS Take 1 capsule (1,000 Units total) by mouth daily.   Dextromethorphan-guaiFENesin ([employer] MUCUS DM EXTENDED RELEASE) 30-600 MG TB12 TAKE 1 TABLET BY MOUTH TWICE A DAY (Patient taking differently: As needed)   FARXIGA 10 MG TABS tablet TAKE 1 TABLET BY MOUTH EVERY DAY   fluticasone (FLONASE) 50 MCG/ACT nasal spray Place 2 sprays into both nostrils daily. (Patient taking differently: Place 2 sprays into both nostrils daily. As needed)   hydrALAZINE (APRESOLINE) 50 MG tablet  Take 1 tablet (50 mg total) by mouth 3 (three) times daily.   hydrocortisone (ANUSOL-HC) 25 MG suppository Place 1 suppository (25 mg total) rectally 2 (two) times daily.   hydrocortisone 2.5 % cream Apply topically 2 (two) times daily.   isosorbide mononitrate (IMDUR) 30 MG 24 hr tablet TAKE 1 TABLET BY MOUTH EVERY DAY   losartan (COZAAR) 50 MG tablet Take 1 tablet (50 mg total) by mouth in the morning and at bedtime.   potassium chloride SA (KLOR-CON M) 20 MEQ tablet Take 1 tablet (20 mEq total) by mouth as needed (Take when  taking Torsemide).   Probiotic CHEW Chew 2 capsules by mouth daily.   spironolactone (ALDACTONE) 25 MG tablet TAKE 1 TABLET (25 MG TOTAL) BY MOUTH DAILY.   torsemide (DEMADEX) 20 MG tablet Take 1 tablet (20 mg total) by mouth as needed (when weight is up or notes swelling).     Allergies:   Lisinopril-hydrochlorothiazide and Other   Social History   Socioeconomic History   Marital status: Divorced    Spouse name: Not on file   Number of children: 4   Years of education: Not on file   Highest education level: Not on file  Occupational History   Occupation: CNA    Employer: [employer] systems [employer]   Tobacco Use   Smoking status: Never   Smokeless tobacco: Never  Vaping Use   Vaping Use: Never used  Substance and Sexual Activity   Alcohol use: No   Drug use: No   Sexual activity: Yes    Partners: Male    Birth control/protection: Surgical    Comment: BTL  Other Topics Concern   Not on file  Social History Narrative   Rayen has 4 children. Three live at home with her.   Social Determinants of Health   Financial Resource Strain: Not on file  Food Insecurity: No Food Insecurity (05/31/2021)   Hunger Vital Sign    Worried About Running Out of Food in the Last Year: Never true    Ran Out of Food in the Last Year: Never true  Transportation Needs: No Transportation Needs (05/31/2021)   PRAPARE - Hydrologist (Medical): No    Lack of Transportation (Non-Medical): No  Physical Activity: Not on file  Stress: Not on file  Social Connections: Not on file     Family History: The patient's family history includes Cervical cancer in her mother; Diabetes in her sister; Hypertension in her brother, brother, father, and mother; Stroke in her father; Uterine cancer in her maternal grandmother.  ROS:   Please see the history of present illness.     All other systems reviewed and are negative.  EKGs/Labs/Other Studies Reviewed:    The following studies were  reviewed today:  09/09/2021 echo EF 50 RV normal Mild MR Moderately dilated LA    Recent Labs: 05/29/2021: Hemoglobin 10.1; Platelets 390 08/25/2021: TSH 2.050 09/09/2021: ALT 25; B Natriuretic Peptide 30.6 11/24/2021: Magnesium 2.2 03/02/2022: BUN 25; Creatinine, Ser 1.45; Potassium 4.1; Sodium 138  Recent Lipid Panel    Component Value Date/Time   CHOL 193 05/25/2021 1414   CHOL 202 (H) 02/05/2020 1417   TRIG 102.0 05/25/2021 1414   HDL 47.60 05/25/2021 1414   HDL 46 02/05/2020 1417   CHOLHDL 4 05/25/2021 1414   VLDL 20.4 05/25/2021 1414   LDLCALC 125 (H) 05/25/2021 1414   LDLCALC 139 (H) 02/05/2020 1417    Physical Exam:    VS:  BP  126/71   Pulse 80   Ht _0  (1.6 m)   Wt 245 lb (111.1 kg)   SpO2 99%   BMI 43.40 kg/m     Wt Readings from Last 3 Encounters:  05/04/22 245 lb (111.1 kg)  04/16/22 250 lb (113.4 kg)  04/08/22 251 lb 9.6 oz (114.1 kg)     GEN: Well nourished, well developed in no acute distress HEENT: Normal NECK: No JVD; No carotid bruits LYMPHATICS: No lymphadenopathy CARDIAC: RRR, no murmurs, rubs, gallops RESPIRATORY:  Clear to auscultation without rales, wheezing or rhonchi  ABDOMEN: Soft, non-tender, non-distended MUSCULOSKELETAL:  No edema; No deformity  SKIN: Warm and dry NEUROLOGIC:  Alert and oriented x 3 PSYCHIATRIC:  Normal affect     ASSESSMENT:    1. Chronic systolic CHF (congestive heart failure) (HCC)   2. Persistent atrial fibrillation ([place])   3. Morbid obesity ([place])   4. Encounter for long-term (current) use of high-risk medication    PLAN:    In order of problems listed above:  #chronic systolic heart failure NYHA II. Warm and dry. Rhythm control strategy indicated. Continue current medications  #AF, persistent Rhythm control indicated as above given history of tachy medicated cardiomyopathy  Continue eliquis for stroke ppx.  Continue amiodarone. Check CMP, TSH and FT4 today.   Discussed treatment options  today for her AF including antiarrhythmic drug therapy and ablation. Discussed risks, recovery and likelihood of success. Discussed potential need for repeat ablation procedures and antiarrhythmic drugs after an initial ablation. They wish to proceed with scheduling.  Risk, benefits, and alternatives to EP study and radiofrequency ablation for afib were also discussed in detail today. These risks include but are not limited to stroke, bleeding, vascular damage, tamponade, perforation, damage to the esophagus, lungs, and other structures, pulmonary vein stenosis, worsening renal function, and death. The patient understands these risk and wishes to proceed.  We will therefore proceed with catheter ablation at the next available time.  Carto, ICE, anesthesia are requested for the procedure.  Will also obtain CT PV protocol prior to the procedure to exclude LAA thrombus and further evaluate atrial anatomy.  #Obesity Discussed the link between obesity and ablation success rates.  We discussed how even a 5% reduction in weight may have an impact on ablation success rates.    Medication Adjustments/Labs and Tests Ordered: Current medicines are reviewed at length with the patient today.  Concerns regarding medicines are outlined above.  Orders Placed This Encounter  Procedures   CT CARDIAC MORPH/PULM VEIN W/CM&W/O CA SCORE   CBC w/Diff   Basic Metabolic Panel (BMET)   No orders of the defined types were placed in this encounter.  I,Coren O'Brien,acting as a Education administrator for Vickie Epley, MD.,have documented all relevant documentation on the behalf of Vickie Epley, MD,as directed by  Vickie Epley, MD while in the presence of Vickie Epley, MD.   I, Vickie Epley, MD, have reviewed all documentation for this visit. The documentation on 05/04/22 for the exam, diagnosis, procedures, and orders are all accurate and complete.   Signed, Lars Mage, MD, [facility], [facility] 05/04/2022 1:51 PM     Electrophysiology [facility]

## 2022-05-07 ENCOUNTER — Telehealth: Payer: Commercial Managed Care - HMO | Admitting: Internal Medicine

## 2022-05-07 ENCOUNTER — Telehealth: Payer: Commercial Managed Care - HMO | Admitting: Family

## 2022-05-07 DIAGNOSIS — R21 Rash and other nonspecific skin eruption: Secondary | ICD-10-CM

## 2022-05-08 ENCOUNTER — Telehealth: Payer: Commercial Managed Care - HMO | Admitting: Family

## 2022-05-08 DIAGNOSIS — R21 Rash and other nonspecific skin eruption: Secondary | ICD-10-CM | POA: Diagnosis not present

## 2022-05-08 MED ORDER — TRIAMCINOLONE ACETONIDE 0.1 % EX CREA: 1.0000 | TOPICAL_CREAM | Freq: Two times a day (BID) | CUTANEOUS | 0 refills | Status: AC

## 2022-05-08 NOTE — Progress Notes (Signed)
E Visit for Rash  We are sorry that you are not feeling well. Here is how we plan to help!  Based on what you shared with me it looks like you have contact dermatitis.  Contact dermatitis is a skin rash caused by something that touches the skin and causes irritation or inflammation.  Your skin may be red, swollen, dry, cracked, and itch.  The rash should go away in a few days but can last a few weeks.  If you get a rash, it's important to figure out what caused it so the irritant can be avoided in the future.  I recommend you take Benadryl 25 mg - 50 mg every 4 hours to control the symptoms but if they last over 24 hours it is best that you see an office based provider for follow up.  I have sent in Kenalog cream (a steroid cream) you will apply twice a day.   HOME CARE:  Take cool showers and avoid direct sunlight. Apply cool compress or wet dressings. Take a bath in an oatmeal bath.  Sprinkle content of one Aveeno packet under running faucet with comfortably warm water.  Bathe for 15-20 minutes, 1-2 times daily.  Pat dry with a towel. Do not rub the rash. Use hydrocortisone cream. Take an antihistamine like Benadryl for widespread rashes that itch.  The adult dose of Benadryl is 25-50 mg by mouth 4 times daily. Caution:  This type of medication may cause sleepiness.  Do not drink alcohol, drive, or operate dangerous machinery while taking antihistamines.  Do not take these medications if you have prostate enlargement.  Read package instructions thoroughly on all medications that you take.  GET HELP RIGHT AWAY IF:  Symptoms don't go away after treatment. Severe itching that persists. If you rash spreads or swells. If you rash begins to smell. If it blisters and opens or develops a yellow-brown crust. You develop a fever. You have a sore throat. You become short of breath.  MAKE SURE YOU:  Understand these instructions. Will watch your condition. Will get help right away if you are  not doing well or get worse.  Thank you for choosing an e-visit.  Your e-visit answers were reviewed by a board certified advanced clinical practitioner to complete your personal care plan. Depending upon the condition, your plan could have included both over the counter or prescription medications.  Please review your pharmacy choice. Make sure the pharmacy is open so you can pick up prescription now. If there is a problem, you may contact your provider through [employer] and have the prescription routed to another pharmacy.  Your safety is important to [place]. If you have drug allergies check your prescription carefully.   For the next 24 hours you can use MyChart to ask questions about today's visit, request a non-urgent call back, or ask for a work or school excuse. You will get an email in the next two days asking about your experience. I hope that your e-visit has been valuable and will speed your recovery.  Approximately 5 minutes was spent documenting and reviewing patient's chart.

## 2022-05-08 NOTE — Progress Notes (Signed)
Hello, Please attach a picture and any more details on the rash so I can treat you appropriately.   Evelina Dun, FNP

## 2022-05-10 ENCOUNTER — Other Ambulatory Visit (HOSPITAL_COMMUNITY): Payer: Self-pay | Admitting: *Deleted

## 2022-05-10 MED ORDER — HYDRALAZINE HCL 50 MG PO TABS: 50.0000 mg | ORAL_TABLET | Freq: Three times a day (TID) | ORAL | 6 refills | Status: DC

## 2022-05-11 ENCOUNTER — Telehealth: Payer: Commercial Managed Care - HMO | Admitting: Internal Medicine

## 2022-05-14 ENCOUNTER — Telehealth (INDEPENDENT_AMBULATORY_CARE_PROVIDER_SITE_OTHER): Payer: Commercial Managed Care - HMO | Admitting: Internal Medicine

## 2022-05-14 ENCOUNTER — Encounter: Payer: Self-pay | Admitting: Internal Medicine

## 2022-05-14 DIAGNOSIS — M79672 Pain in left foot: Secondary | ICD-10-CM | POA: Diagnosis not present

## 2022-05-14 DIAGNOSIS — N946 Dysmenorrhea, unspecified: Secondary | ICD-10-CM | POA: Insufficient documentation

## 2022-05-14 MED ORDER — NORETHINDRONE ACET-ETHINYL EST 1-20 MG-MCG PO TABS: 1.0000 | ORAL_TABLET | Freq: Every day | ORAL | 11 refills | Status: DC

## 2022-05-14 NOTE — Assessment & Plan Note (Signed)
Injury to foot and residual pain. Ordered x-ray to assess for fracture.

## 2022-05-14 NOTE — Progress Notes (Signed)
Virtual Visit via Video Note  I connected with Christine Oneal on 05/14/22 at  3:00 PM EDT by a video enabled telemedicine application and verified that I am speaking with the correct person using two identifiers.  The patient and the provider were at separate locations throughout the entire encounter. Patient location: home, Provider location: work   I discussed the limitations of evaluation and management by telemedicine and the availability of in person appointments. The patient expressed understanding and agreed to proceed. The patient and the provider were the only parties present for the visit unless noted in HPI below.  History of Present Illness: The patient is a 51 y.o. female with visit for heavy periods and left foot pain after injury.   Observations/Objective: Appearance: normal, breathing appears normal, casual grooming, abdomen does appear distended  Assessment and Plan: See problem oriented charting  Follow Up Instructions: rx ocp and left foot x-ray  I discussed the assessment and treatment plan with the patient. The patient was provided an opportunity to ask questions and all were answered. The patient agreed with the plan and demonstrated an understanding of the instructions.   The patient was advised to call back or seek an in-person evaluation if the symptoms worsen or if the condition fails to improve as anticipated.  Hoyt Koch, MD

## 2022-05-14 NOTE — Assessment & Plan Note (Signed)
Has heavy periods and is likely perimenopausal. Ordered low dose OCP to take daily. She is already on eliquis for A fib so DVT risk is mitigated.

## 2022-05-15 ENCOUNTER — Other Ambulatory Visit (HOSPITAL_COMMUNITY): Payer: Self-pay | Admitting: Family Medicine

## 2022-05-17 ENCOUNTER — Telehealth (HOSPITAL_COMMUNITY): Payer: Self-pay

## 2022-05-17 NOTE — Telephone Encounter (Signed)
Per Dr. Haroldine Laws patient needs to be scheduled for an echo soon for cardiac clearance for surgery, tried calling patient to schedule , no answer, left message to return call.

## 2022-05-18 ENCOUNTER — Encounter (HOSPITAL_COMMUNITY): Payer: Commercial Managed Care - HMO

## 2022-05-25 ENCOUNTER — Ambulatory Visit (INDEPENDENT_AMBULATORY_CARE_PROVIDER_SITE_OTHER): Payer: Commercial Managed Care - HMO | Admitting: Internal Medicine

## 2022-05-25 ENCOUNTER — Encounter: Payer: Self-pay | Admitting: Internal Medicine

## 2022-05-25 ENCOUNTER — Encounter (HOSPITAL_COMMUNITY)
Admission: RE | Admit: 2022-05-25 | Discharge: 2022-05-25 | Disposition: A | Discharge status: Home / Self Care | Payer: Commercial Managed Care - HMO | Source: Ambulatory Visit | Attending: Surgery | Admitting: Surgery

## 2022-05-25 VITALS — BP 120/76 | HR 66 | Ht 63.0 in | Wt 238.0 lb

## 2022-05-25 DIAGNOSIS — E210 Primary hyperparathyroidism: Secondary | ICD-10-CM

## 2022-05-25 DIAGNOSIS — E213 Hyperparathyroidism, unspecified: Secondary | ICD-10-CM | POA: Insufficient documentation

## 2022-05-25 LAB — BASIC METABOLIC PANEL
BUN: 20 mg/dL (ref 6–23)
CO2: 24 mEq/L (ref 19–32)
Calcium: 10.5 mg/dL (ref 8.4–10.5)
Chloride: 107 mEq/L (ref 96–112)
Creatinine, Ser: 1.35 mg/dL — ABNORMAL HIGH (ref 0.40–1.20)
GFR: 45.07 mL/min — ABNORMAL LOW (ref >60.00)
Glucose, Bld: 89 mg/dL (ref 70–99)
Potassium: 4.3 mEq/L (ref 3.5–5.1)
Sodium: 137 mEq/L (ref 135–145)

## 2022-05-25 LAB — VITAMIN D 25 HYDROXY (VIT D DEFICIENCY, FRACTURES): VITD: 21.27 ng/mL — ABNORMAL LOW (ref 30.00–100.00)

## 2022-05-25 LAB — ALBUMIN: Albumin: 3.9 g/dL (ref 3.5–5.2)

## 2022-05-25 MED ORDER — TECHNETIUM TC 99M SESTAMIBI - CARDIOLITE
25.1000 | Freq: Once | INTRAVENOUS | Status: AC | PRN
  Administered 2022-05-25: 25.1 via INTRAVENOUS

## 2022-05-25 NOTE — Progress Notes (Signed)
Name: Christine Oneal  MRN/ DOB: [id number], Dec 29, 1970    Age/ Sex: 51 y.o., female    PCP: Hoyt Koch, MD   Reason for Endocrinology Evaluation: Hypercalcemia      Date of Initial Endocrinology Evaluation: 08/24/2021     HPI: [name] is a 51 y.o. female with a past medical history of A. Fib, CHF, OSA. The patient presented for initial endocrinology clinic visit on 2/13/20223 for consultative assistance with her hypercalcemia .   Christine Oneal indicates that she was first diagnosed with hypercalcemia in 2021, with a serum a max calcium of 13.9 mg/dL ( uncorrected) in 05/2021 with elevated PTH 91 pg/mL .   Of note, the pt was hospitalized 05/2021 and pamidronate was given     She denies  history of kidney stones, kidney disease, liver disease, granulomatous disease. She denies  osteoporosis or prior fractures. Daily dietary calcium intake: 2 servings . She denies  family history of osteoporosis, parathyroid disease   24-hour urinary calcium 419 mg in November 2022  Sister with  thyroid disease.   DXA low bone density with a T score -1.3 at the [facility] 08/31/2021   SUBJECTIVE:     Today (05/25/22): Christine Oneal  is here for follow-up on hypercalcemia.   She was seen by Dr. Harlow Asa in October 2023 for additional evaluation of parathyroidectomy, scheduled for a scan today   Was seen by cardiology for A. Fib follow up   Denies palpitations  Pending cardiac ablation 07/2022 Constipation is improving  Denies Polyuria and polydipsia   She has avoided OTC calcium tablet  She consumes 1-2 servings of dietary calcium a day  Vitamin D3 1000 iu daily     HISTORY:  Past Medical History:  Past Medical History:  Diagnosis Date   Abnormal Pap smear of cervix    Allergy    Anemia    CHF (congestive heart failure) (HCC)    Fibroid    Hypertension    Leukocytosis, unspecified 10/18/2013   Obesity    Plantar fasciitis    Past Surgical History:  Past  Surgical History:  Procedure Laterality Date   CARDIOVERSION N/A 05/12/2021   Procedure: CARDIOVERSION;  Surgeon: Jolaine Artist, MD;  Location: [place];  Service: Cardiovascular;  Laterality: N/A;   CARDIOVERSION N/A 05/29/2021   Procedure: CARDIOVERSION;  Surgeon: Jolaine Artist, MD;  Location: [facility] ENDOSCOPY;  Service: Cardiovascular;  Laterality: N/A;   COLPOSCOPY     RIGHT/LEFT HEART CATH AND CORONARY ANGIOGRAPHY N/A 05/11/2021   Procedure: RIGHT/LEFT HEART CATH AND CORONARY ANGIOGRAPHY;  Surgeon: Jolaine Artist, MD;  Location: [place] CV LAB;  Service: Cardiovascular;  Laterality: N/A;   TEE WITHOUT CARDIOVERSION N/A 05/12/2021   Procedure: TRANSESOPHAGEAL ECHOCARDIOGRAM (TEE);  Surgeon: Jolaine Artist, MD;  Location: [facility] ENDOSCOPY;  Service: Cardiovascular;  Laterality: N/A;   TUBAL LIGATION  2001    Social History:  reports that she has never smoked. She has never used smokeless tobacco. She reports that she does not drink alcohol and does not use drugs. Family History: family history includes Cervical cancer in her mother; Diabetes in her sister; Hypertension in her brother, brother, father, and mother; Stroke in her father; Uterine cancer in her maternal grandmother.   HOME MEDICATIONS: Allergies as of 05/25/2022       Reactions   Lisinopril-hydrochlorothiazide Anaphylaxis   Angioedema   Other Anaphylaxis        Medication List  Accurate as of May 25, 2022 10:07 AM. If you have any questions, ask your nurse or doctor.          acetaminophen 500 MG tablet Commonly known as: TYLENOL Take 1,000 mg by mouth every 6 (six) hours as needed for moderate pain or headache.   amiodarone 200 MG tablet Commonly known as: PACERONE Take 1 tablet (200 mg total) by mouth daily.   azelastine 0.1 % nasal spray Commonly known as: ASTELIN Place 1 spray into both nostrils 2 (two) times daily. Use in each nostril as directed   benzonatate 100 MG  capsule Commonly known as: TESSALON Take 1 capsule (100 mg total) by mouth 3 (three) times daily as needed for cough.   carvedilol 6.25 MG tablet Commonly known as: COREG TAKE 1 TABLET BY MOUTH TWICE A DAY   [employer] Mucus DM Extended Release 30-600 MG Tb12 TAKE 1 TABLET BY MOUTH TWICE A DAY What changed:  how much to take how to take this when to take this additional instructions   Eliquis 5 MG Tabs tablet Generic drug: apixaban TAKE 1 TABLET BY MOUTH TWICE DAILY   Farxiga 10 MG Tabs tablet Generic drug: dapagliflozin propanediol TAKE 1 TABLET BY MOUTH EVERY DAY   fluticasone 50 MCG/ACT nasal spray Commonly known as: FLONASE Place 2 sprays into both nostrils daily. What changed: additional instructions   hydrALAZINE 50 MG tablet Commonly known as: APRESOLINE Take 1 tablet (50 mg total) by mouth 3 (three) times daily.   hydrocortisone 2.5 % cream Apply topically 2 (two) times daily.   hydrocortisone 25 MG suppository Commonly known as: ANUSOL-HC Place 1 suppository (25 mg total) rectally 2 (two) times daily.   isosorbide mononitrate 30 MG 24 hr tablet Commonly known as: IMDUR TAKE 1 TABLET BY MOUTH EVERY DAY   losartan 50 MG tablet Commonly known as: COZAAR Take 1 tablet (50 mg total) by mouth in the morning and at bedtime.   norethindrone-ethinyl estradiol 1-20 MG-MCG tablet Commonly known as: MICROGESTIN Take 1 tablet by mouth daily.   potassium chloride SA 20 MEQ tablet Commonly known as: KLOR-CON M Take 1 tablet (20 mEq total) by mouth as needed (Take when taking Torsemide).   Probiotic Chew Chew 2 capsules by mouth daily.   spironolactone 25 MG tablet Commonly known as: ALDACTONE TAKE 1 TABLET (25 MG TOTAL) BY MOUTH DAILY.   torsemide 20 MG tablet Commonly known as: Demadex Take 1 tablet (20 mg total) by mouth as needed (when weight is up or notes swelling).   triamcinolone cream 0.1 % Commonly known as: KENALOG Apply 1 Application topically 2  (two) times daily.   Vitamin D3 25 MCG (1000 UT) Caps Take 1 capsule (1,000 Units total) by mouth daily.          REVIEW OF SYSTEMS: A comprehensive ROS was conducted with the patient and is negative except as per HPI     OBJECTIVE:  VS: BP 120/76 (BP Location: Left Arm, Patient Position: Sitting, Cuff Size: Large)   Pulse 66   Ht _0  (1.6 m)   Wt 238 lb (108 kg)   SpO2 99%   BMI 42.16 kg/m    Wt Readings from Last 3 Encounters:  05/25/22 238 lb (108 kg)  05/04/22 245 lb (111.1 kg)  04/16/22 250 lb (113.4 kg)     EXAM: General: Pt appears well and is in NAD  Eyes: External eye exam normal without stare, lid lag or exophthalmos.  EOM intact.  PERRL.  Neck: General: Supple without adenopathy. Thyroid: Thyroid size normal.  No goiter or nodules appreciated. No thyroid bruit.  Lungs: Clear with good BS bilat with no rales, rhonchi, or wheezes  Heart: Auscultation: RRR.  Abdomen: Normoactive bowel sounds, soft, nontender, without masses or organomegaly palpable  Extremities:  BL LE: No pretibial edema normal ROM and strength.  Mental Status: Judgment, insight: Intact Orientation: Oriented to time, place, and person Mood and affect: No depression, anxiety, or agitation     DATA REVIEWED:  Latest Reference Range & Units 05/25/22 10:14  Sodium 135 - 145 mEq/L 137  Potassium 3.5 - 5.1 mEq/L 4.3  Chloride 96 - 112 mEq/L 107  CO2 19 - 32 mEq/L 24  Glucose 70 - 99 mg/dL 89  BUN 6 - 23 mg/dL 20  Creatinine 0.40 - 1.20 mg/dL 1.35 (H)  Calcium 8.4 - 10.5 mg/dL 10.5  Albumin 3.5 - 5.2 g/dL 3.9  GFR >60.00 mL/min 45.07 (L)    Latest Reference Range & Units 05/25/22 10:14  VITD 30.00 - 100.00 ng/mL 21.27 (L)     Latest Reference Range & Units 05/28/21 09:45  Calcium, 24 hour urine 0 - 320 mg/24 hr 419 (H)     Thyroid Ultrasound 05/28/2021   Estimated total number of nodules >/= 1 cm: 0   Number of spongiform nodules >/=  2 cm not described below (TR1): 0    Number of mixed cystic and solid nodules >/= 1.5 cm not described below (TR2): 0   _________________________________________________________   Subcentimeter hypoechoic nodule in the posterior aspect the right thyroid lobe does not meet criteria for further dedicated follow-up or biopsy. No other discrete nodules are seen within the thyroid gland.   Posterior to the left thyroid lobe, there is a hypoechoic ovoid lesion the measures up to 2.1 x 0.8 x 1.0 cm.   IMPRESSION: 1. Normal sonographic appearance of the thyroid gland. 2. Posterior to the left thyroid lobe, there is a hypoechoic ovoid lesion that measures up to approximately 2 cm in size. Differential considerations include enlarged parathyroid gland versus lymph node.    ASSESSMENT/PLAN/RECOMMENDATIONS:   Primary hyperparathyroidism:  -Patient is status post pamidronate in November 2022 for severe hypercalcemia up to 13.9 mg/DL -Patient meets criteria for surgical intervention based on 24-hour urine calcium excretion elevated at 419 mg and a GFR <60 -DXA showed low bone density but no osteoporosis --Repeat serum calcium shows continued elevation at 10.58 mg/DL (corrected) , GFR remains low -She has already met with the surgeon and pending parathyroid imaging today   Recommendations - Encouraged hydration  - AVOID CALCIUM SUPPLEMENTS, AVOID LOW CALCIUM DIET - Maintain normal dietary calcium intake (2-3 servings of dairy a day)  2.  Vitamin D deficiency:  -Vitamin D remains low will increase -Increase vitamin D  2000 IU daily     Follow-up in 6 months  Signed electronically by: Mack Guise, MD  [facility] End[facility]  [address] [place], [place] [address] Phone: [phone] FAX: [phone]   CC: Hoyt Koch, [place] [address] Phone: [phone] Fax: [phone]   Return to Endocrinology clinic as  below: Future Appointments  Date Time Provider [place]  05/25/2022 12:00 PM MC-NM INJ 1 MC-NM [facility]  05/25/2022  2:00 PM MC-NM 1 MC-NM [facility]  06/22/2022  3:00 PM Bensimhon, Shaune Pascal, MD MC-HVSC None  06/24/2022 10:40 AM GI-BCG MM 2 GI-BCGMM GI-BREAST CE  07/13/2022  7:30 AM [facility]  LAB [facility]  07/27/2022 11:30 AM DW[facility]-CT 1 DWB-CT [facility]B  10/15/2022 10:00 AM Hoyt Koch, MD [facility]-GR None

## 2022-05-25 NOTE — Patient Instructions (Signed)
-   Stay Hydrated  - Avoid over the counter calcium tablet  - Consume 2-3 servings of calcium daily ( low fat dairy, green leafy vegetables )

## 2022-05-26 LAB — PARATHYROID HORMONE, INTACT (NO CA): PTH: 77 pg/mL (ref 16–77)

## 2022-05-26 LAB — CALCIUM, IONIZED: Calcium, Ion: 5.9 mg/dL — ABNORMAL HIGH (ref 4.7–5.5)

## 2022-06-01 NOTE — Progress Notes (Signed)
Nuclear scan confirms left parathyroid adenoma and raises possibility of a second gland adenoma in the right inferior position.  Would address both at time of neck exploration.  Discussed surgery again today with patient - likely outpatient procedure at [facility].  Will need cardiac clearance from her cardiologist.  She is scheduled to see Dr. Haroldine Laws on 12/14 in office, and possible cardiac procedure in January.  Will need advice regarding timing of parathyroid surgery and management of anticoagulation.  Claiborne Billings - please submit request for cardiac clearance.  Will enter orders for surgery and send to schedulers pending cardiology evaluation and input.  Armandina Gemma, MD [facility] [place] practice Office: [phone]

## 2022-06-14 ENCOUNTER — Ambulatory Visit: Payer: Commercial Managed Care - HMO | Admitting: Internal Medicine

## 2022-06-21 ENCOUNTER — Other Ambulatory Visit: Payer: Self-pay | Admitting: Nurse Practitioner

## 2022-06-21 ENCOUNTER — Telehealth: Payer: Self-pay | Admitting: Nurse Practitioner

## 2022-06-21 MED ORDER — DAPAGLIFLOZIN PROPANEDIOL 10 MG PO TABS: 10.0000 mg | ORAL_TABLET | Freq: Every day | ORAL | 6 refills | Status: DC

## 2022-06-21 NOTE — Telephone Encounter (Signed)
   Pt called this evening to report that she is out of her [place] and in need of a refill.  Chart reviewed.  Farxiga '10mg'$  PO Daily, #30, 6 refills sent to [facility] in [place] @ her request.  Lab Results  Component Value Date   CREATININE 1.35 (H) 05/25/2022   BUN 20 05/25/2022   NA 137 05/25/2022   K 4.3 05/25/2022   CL 107 05/25/2022   CO2 24 05/25/2022     Caller verbalized understanding and was grateful for the call back.  Murray Hodgkins, NP 06/21/2022, 6:10 PM

## 2022-06-22 ENCOUNTER — Encounter (HOSPITAL_COMMUNITY): Payer: Self-pay | Admitting: Internal Medicine

## 2022-06-22 ENCOUNTER — Ambulatory Visit (HOSPITAL_COMMUNITY)
Admission: RE | Admit: 2022-06-22 | Discharge: 2022-06-22 | Disposition: A | Discharge status: Home / Self Care | Payer: Commercial Managed Care - HMO | Source: Ambulatory Visit | Attending: Internal Medicine | Admitting: Internal Medicine

## 2022-06-22 VITALS — BP 110/70 | HR 88 | Wt 239.8 lb

## 2022-06-22 DIAGNOSIS — R0683 Snoring: Secondary | ICD-10-CM | POA: Insufficient documentation

## 2022-06-22 DIAGNOSIS — Z7901 Long term (current) use of anticoagulants: Secondary | ICD-10-CM | POA: Insufficient documentation

## 2022-06-22 DIAGNOSIS — I5040 Unspecified combined systolic (congestive) and diastolic (congestive) heart failure: Secondary | ICD-10-CM | POA: Insufficient documentation

## 2022-06-22 DIAGNOSIS — Z6841 Body Mass Index (BMI) 40.0 and over, adult: Secondary | ICD-10-CM | POA: Diagnosis not present

## 2022-06-22 DIAGNOSIS — I110 Hypertensive heart disease with heart failure: Secondary | ICD-10-CM | POA: Diagnosis not present

## 2022-06-22 DIAGNOSIS — I4819 Other persistent atrial fibrillation: Secondary | ICD-10-CM | POA: Diagnosis not present

## 2022-06-22 DIAGNOSIS — E210 Primary hyperparathyroidism: Secondary | ICD-10-CM | POA: Diagnosis not present

## 2022-06-22 DIAGNOSIS — I5022 Chronic systolic (congestive) heart failure: Secondary | ICD-10-CM

## 2022-06-22 DIAGNOSIS — I10 Essential (primary) hypertension: Secondary | ICD-10-CM | POA: Diagnosis not present

## 2022-06-22 DIAGNOSIS — R000 Tachycardia, unspecified: Secondary | ICD-10-CM | POA: Insufficient documentation

## 2022-06-22 DIAGNOSIS — E669 Obesity, unspecified: Secondary | ICD-10-CM | POA: Diagnosis not present

## 2022-06-22 DIAGNOSIS — Z79899 Other long term (current) drug therapy: Secondary | ICD-10-CM | POA: Diagnosis not present

## 2022-06-22 NOTE — Progress Notes (Signed)
ADVANCED HF CLINIC NOTE  PCP: Maximiano Coss, NP Primary Cardiologist: Dr. Haroldine Laws  HPI: Christine Oneal is a 51 y.o. 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   Admitted from 05/03/21- 05/14/21 w/ new atrial fibrillation and CHF. She was admitted under Cardiology service and AHF consulted. D/t concern for low-output HF she was started on IV milrinone. Echo with LVEF < 20%, moderate LVH, moderately reduced RV, severe MR. 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 R/LHC 10/31 with normal coronary arteries, RA 2, PA 31/8, Fick CI 2.5, PA sat 67%. Diuresed with IV lasix then transitioned to po torsemide, weight down total of 31 lb. D/C weight 287 lb.     Unsuccessful DCCV x 3 shocks 11/22. IV amio switched to po 200 mg bid. Anticoagulated with Eliquis.    Pt had post hospital f/u w/ her PCP on 11/14 and complained of constipation and palpitations. Labs showed severe hypercalcemia and AKI secondary to overdiuresis as well as multiple medications.  Ca 13.9. SCr 1.65 (baseline 0.9). Given IV hydration and IV pamidronate. Remained in AF with RVR on admit, underwernt DCCV on 05/29/21 with brief return to NSR then back to AF. Back in NSR on day of discharge.  Echo 09/09/21 EF 50-55%   Saw Dr. Quentin Ore in 10/23 and scheduled for AF ablation on 08/03/22  Today she returns for HF follow up. Feels good. Denies CP or SOB. Following with Dr. Harlow Asa for parathyroid surgery (not scheduled yet). Denies edema, orthopnea or PND. Rare palpitation. Compliant with meds.   Cardiac Studies: - Echo (10/22): EF < 20%, RV moderately down, severe MR, mild to mod TR  - TEE in (11/22): EF 25-30%, normal RV, mild MR.    - RHC (10/22):   Ao = 156/89 (112) LV =135/7 RA = 2 RV = 27/6 PA = 31/8 (20) PCW = 7 Fick cardiac output/index = 5.6/2.5 PVR = 2.2 WU FA sat = 99% PA sat = 67%. 69% SVC sat = 72%   1. Normal  coronary arteries 2. 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 Well-compensated hemodynamics after large-volume diuresis  ROS: All systems negative except as listed in HPI, PMH and Problem List.  SH:  Social History   Socioeconomic History   Marital status: Divorced    Spouse name: Not on file   Number of children: 4   Years of education: Not on file   Highest education level: Not on file  Occupational History   Occupation: CNA    Employer: Information systems manager   Tobacco Use   Smoking status: Never   Smokeless tobacco: Never  Vaping Use   Vaping Use: Never used  Substance and Sexual Activity   Alcohol use: No   Drug use: No   Sexual activity: Yes    Partners: Male    Birth control/protection: Surgical    Comment: BTL  Other Topics Concern   Not on file  Social History Narrative   Christine Oneal has 4 children. Three live at home with her.   Social Determinants of Health   Financial Resource Strain: Not on file  Food Insecurity: No Food Insecurity (05/31/2021)   Hunger Vital Sign    Worried About Running Out of Food in the Last Year: Never true    Ran Out of Food in the Last Year: Never true  Transportation Needs: No Transportation Needs (05/31/2021)  PRAPARE - Hydrologist (Medical): No    Lack of Transportation (Non-Medical): No  Physical Activity: Not on file  Stress: Not on file  Social Connections: Not on file  Intimate Partner Violence: Not on file   FH:  Family History  Problem Relation Age of Onset   Hypertension Mother    Cervical cancer Mother    Hypertension Father    Stroke Father    Uterine cancer Maternal Grandmother    Diabetes Sister    Hypertension Brother    Hypertension Brother    Past Medical History:  Diagnosis Date   Abnormal Pap smear of cervix    Allergy    Anemia    CHF (congestive heart failure) (HCC)    Fibroid    Hypertension    Leukocytosis, unspecified 10/18/2013   Obesity    Plantar  fasciitis    Current Outpatient Medications  Medication Sig Dispense Refill   acetaminophen (TYLENOL) 500 MG tablet Take 1,000 mg by mouth every 6 (six) hours as needed for moderate pain or headache.     amiodarone (PACERONE) 200 MG tablet Take 1 tablet (200 mg total) by mouth daily. 90 tablet 1   amLODipine (NORVASC) 10 MG tablet Take 1 tablet by mouth daily.     apixaban (ELIQUIS) 5 MG TABS tablet Take 1 tablet (5 mg total) by mouth 2 (two) times daily. 60 tablet 6   azelastine (ASTELIN) 0.1 % nasal spray Place 1 spray into both nostrils 2 (two) times daily. Use in each nostril as directed 30 mL 12   benzonatate (TESSALON) 100 MG capsule Take 1 capsule (100 mg total) by mouth 3 (three) times daily as needed for cough. 20 capsule 0   carvedilol (COREG) 6.25 MG tablet TAKE 1 TABLET BY MOUTH TWICE A DAY 180 tablet 3   Cholecalciferol (VITAMIN D3) 25 MCG (1000 UT) CAPS Take 1 capsule (1,000 Units total) by mouth daily. 90 capsule 3   Dextromethorphan-guaiFENesin (CVS MUCUS DM EXTENDED RELEASE) 30-600 MG TB12 TAKE 1 TABLET BY MOUTH TWICE A DAY (Patient taking differently: As needed) 20 tablet 0   FARXIGA 10 MG TABS tablet TAKE 1 TABLET BY MOUTH EVERY DAY 30 tablet 3   fluticasone (FLONASE) 50 MCG/ACT nasal spray Place 2 sprays into both nostrils daily. (Patient taking differently: Place 2 sprays into both nostrils daily. As needed) 16 g 6   hydrALAZINE (APRESOLINE) 50 MG tablet Take 1 tablet (50 mg total) by mouth 3 (three) times daily. 90 tablet 6   hydrocortisone (ANUSOL-HC) 25 MG suppository Place 1 suppository (25 mg total) rectally 2 (two) times daily. 12 suppository 0   hydrocortisone 2.5 % cream Apply topically 2 (two) times daily. 30 g 1   isosorbide mononitrate (IMDUR) 30 MG 24 hr tablet TAKE 1 TABLET BY MOUTH EVERY DAY 90 tablet 1   losartan (COZAAR) 50 MG tablet Take 1 tablet (50 mg total) by mouth in the morning and at bedtime. 90 tablet 3   meloxicam (MOBIC) 15 MG tablet 1 tablet by  mouth as needed     potassium chloride SA (KLOR-CON M) 20 MEQ tablet Take 1 tablet (20 mEq total) by mouth as needed (Take when taking Torsemide). 30 tablet 3   Probiotic CHEW Chew 2 capsules by mouth daily.     spironolactone (ALDACTONE) 25 MG tablet TAKE 1 TABLET (25 MG TOTAL) BY MOUTH DAILY. 90 tablet 3   torsemide (DEMADEX) 20 MG tablet Take 1 tablet (20 mg  total) by mouth as needed (when weight is up or notes swelling). 30 tablet 3   No current facility-administered medications for this encounter.   BP 130/78   Pulse 68   Wt 116 kg (255 lb 12.8 oz)   SpO2 99%   BMI 45.31 kg/m   Wt Readings from Last 3 Encounters:  03/02/22 116 kg (255 lb 12.8 oz)  02/19/22 116.2 kg (256 lb 3.2 oz)  12/25/21 115.7 kg (255 lb)   PHYSICAL EXAM: General:  Well appearing. No resp difficulty HEENT: normal Neck: supple. no JVD. Carotids 2+ bilat; no bruits. No lymphadenopathy or thryomegaly appreciated. Cor: PMI nondisplaced. Irregular rate & rhythm. No rubs, gallops or murmurs. Lungs: clear Abdomen: obese soft, nontender, nondistended. No hepatosplenomegaly. No bruits or masses. Good bowel sounds. Extremities: no cyanosis, clubbing, rash, edema Neuro: alert & orientedx3, cranial nerves grossly intact. moves all 4 extremities w/o difficulty. Affect pleasant  ECG: AF 68 Personally reviewed   ASSESSMENT & PLAN:   1. Chronic systolic CHF with recovered EF:  - Nonischemic CMP,  tachy-mediated.   - Echo (10/22): EF < 20%, RV moderately down, severe MR, mild to mod TR - R/LHC (05/11/21): normal cors, RA 2, PA 31/8, Fick CI 2.5, PA sat 67% - TEE in (11/22): EF 25-30%, normal RV, mild MR.   - Echo 09/09/21 EF 50-55% -> EF has normalized  - NYHA II Volume status ok  - Continue torsemide 20 mg daily PRN.  - Continue hydralazine 50 mg tid + Imdur 30 mg daily. - Continue Farxiga 10 mg daily. - Continue spiro 25 mg daily. - Continue losartan 50 mg bid (had angioedema with Entresto) - Continue carvedilol  6.25 mg bid. - Recent labs reviewed from 05/25/22 and are stable - Will repeat echo prior to ablation to make sure EF still ok with return of AF  2. Atrial fibrillation, persistent: - Failed DCCV 10/22.  - Underwent attempted DC-CV 05/29/21 Brief return to NSR then back to AF.  - Converted with amio 06/01/21 - Back in AF today but well rate controlled - Saw Dr. Quentin Ore in 10/23 and scheduled for AF ablation on 08/03/22 - CHA2DS2VASc score of 3. - Continue Eliquis.  - Remains on amio for now   3. Primary hyperparathyroidism - Ca up to 13.9 prompting admission 11/22.  - Received IV fluids and IV pamidronate. - Not on thiazide diuretic. - Followed by Dr. Kelton Pillar, and referred for surgical evaluation.  - Following with Dr. Harlow Asa for parathyroid surgery (not scheduled yet). Will need to wait at least 4 weeks after AF ablation before we can stop anti-coagulation   4. HTN: - Controlled. - Continue current medications.  5. Snoring - HST looked ok   6. Obesity - Body mass index is 45.31 kg/m. - Has been referrred to PharmD for semaglutide to help with weight loss (a1c 5.8 on 10/22). Insurance has not approved  Glori Bickers, MD  3:09 PM

## 2022-06-22 NOTE — Patient Instructions (Signed)
Medication Changes:  None, continue current medications  Lab Work:  none  Testing/Procedures:  Your physician has requested that you have an echocardiogram. Echocardiography is a painless test that uses sound waves to create images of your heart. It provides your doctor with information about the size and shape of your heart and how well your heart's chambers and valves are working. This procedure takes approximately one hour. There are no restrictions for this procedure. Please do NOT wear cologne, perfume, aftershave, or lotions (deodorant is allowed). Please arrive 15 minutes prior to your appointment time.   Referrals:  none  Special Instructions // Education:  Do the following things EVERYDAY: Weigh yourself in the morning before breakfast. Write it down and keep it in a log. Take your medicines as prescribed Eat low salt foods--Limit salt (sodium) to 2000 mg per day.  Stay as active as you can everyday Limit all fluids for the day to less than 2 liters   Follow-Up in: 4 months  At the [facility], you and your health needs are our priority. We have a designated team specialized in the treatment of Heart Failure. This Care Team includes your primary Heart Failure Specialized Cardiologist (physician), Advanced Practice Providers (APPs- Physician Assistants and Nurse Practitioners), and Pharmacist who all work together to provide you with the care you need, when you need it.   You may see any of the following providers on your designated Care Team at your next follow up:  Dr. Glori Bickers Dr. Loralie Champagne Dr. Roxana Hires, NP Lyda Jester, [place] [facility] [place], [place] Forestine Na, NP Audry Riles, PharmD   Please be sure to bring in all your medications bottles to every appointment.   Need to Contact us:  If you have any questions or concerns before your next appointment please send [place] a message through [place] or  call our office at [phone].    TO LEAVE A MESSAGE FOR THE NURSE SELECT OPTION 2, PLEASE LEAVE A MESSAGE INCLUDING: YOUR NAME DATE OF BIRTH CALL BACK NUMBER REASON FOR CALL**this is important as we prioritize the call backs  YOU WILL RECEIVE A CALL BACK THE SAME DAY AS LONG AS YOU CALL BEFORE 4:00 PM

## 2022-06-24 ENCOUNTER — Ambulatory Visit: Payer: Commercial Managed Care - HMO

## 2022-06-25 ENCOUNTER — Other Ambulatory Visit (HOSPITAL_COMMUNITY): Payer: Self-pay

## 2022-06-28 ENCOUNTER — Encounter: Payer: Self-pay | Admitting: Internal Medicine

## 2022-06-28 ENCOUNTER — Ambulatory Visit: Payer: Commercial Managed Care - HMO | Admitting: Internal Medicine

## 2022-06-28 NOTE — Progress Notes (Unsigned)
Virtual Visit via Video Note  I connected with Christine Oneal on 06/28/22 at  3:40 PM EST by a video enabled telemedicine application and verified that I am speaking with the correct person using two identifiers.   I discussed the limitations of evaluation and management by telemedicine and the availability of in person appointments. The patient expressed understanding and agreed to proceed.  Present for the visit:  Myself, Dr Billey Gosling, Lorenz Coaster.  The patient is currently at home and I am in the office.    No referring provider.    History of Present Illness: This is an acute visit for balance issues, hemorrhoids     ROS   Social History   Socioeconomic History   Marital status: Divorced    Spouse name: Not on file   Number of children: 4   Years of education: Not on file   Highest education level: Not on file  Occupational History   Occupation: CNA    Employer: Information systems manager   Tobacco Use   Smoking status: Never   Smokeless tobacco: Never  Vaping Use   Vaping Use: Never used  Substance and Sexual Activity   Alcohol use: No   Drug use: No   Sexual activity: Yes    Partners: Male    Birth control/protection: Surgical    Comment: BTL  Other Topics Concern   Not on file  Social History Narrative   Christine Oneal has 4 children. Three live at home with her.   Social Determinants of Health   Financial Resource Strain: Not on file  Food Insecurity: No Food Insecurity (05/31/2021)   Hunger Vital Sign    Worried About Running Out of Food in the Last Year: Never true    Ran Out of Food in the Last Year: Never true  Transportation Needs: No Transportation Needs (05/31/2021)   PRAPARE - Hydrologist (Medical): No    Lack of Transportation (Non-Medical): No  Physical Activity: Not on file  Stress: Not on file  Social Connections: Not on file     Observations/Objective: Appears well in NAD   Assessment and Plan:  See Problem List for  Assessment and Plan of chronic medical problems.   Follow Up Instructions:    I discussed the assessment and treatment plan with the patient. The patient was provided an opportunity to ask questions and all were answered. The patient agreed with the plan and demonstrated an understanding of the instructions.   The patient was advised to call back or seek an in-person evaluation if the symptoms worsen or if the condition fails to improve as anticipated.    Binnie Rail, MD

## 2022-06-29 ENCOUNTER — Telehealth (INDEPENDENT_AMBULATORY_CARE_PROVIDER_SITE_OTHER): Payer: Commercial Managed Care - HMO | Admitting: Internal Medicine

## 2022-06-29 DIAGNOSIS — K649 Unspecified hemorrhoids: Secondary | ICD-10-CM | POA: Diagnosis not present

## 2022-06-29 DIAGNOSIS — R109 Unspecified abdominal pain: Secondary | ICD-10-CM | POA: Diagnosis not present

## 2022-06-29 DIAGNOSIS — R197 Diarrhea, unspecified: Secondary | ICD-10-CM

## 2022-06-29 MED ORDER — HYDROCORTISONE ACETATE 25 MG RE SUPP: 25.0000 mg | Freq: Two times a day (BID) | RECTAL | 1 refills | Status: DC

## 2022-06-29 MED ORDER — HYDROCORTISONE 2.5 % EX CREA: TOPICAL_CREAM | Freq: Two times a day (BID) | CUTANEOUS | 1 refills | Status: DC

## 2022-07-13 ENCOUNTER — Ambulatory Visit: Payer: Commercial Managed Care - HMO

## 2022-07-14 ENCOUNTER — Ambulatory Visit (HOSPITAL_COMMUNITY): Payer: Commercial Managed Care - HMO

## 2022-07-16 ENCOUNTER — Inpatient Hospital Stay: Admission: RE | Admit: 2022-07-16 | Payer: Commercial Managed Care - HMO | Source: Ambulatory Visit

## 2022-07-23 ENCOUNTER — Other Ambulatory Visit: Payer: Self-pay

## 2022-07-23 ENCOUNTER — Telehealth (HOSPITAL_COMMUNITY): Payer: Self-pay | Admitting: *Deleted

## 2022-07-23 DIAGNOSIS — Z79899 Other long term (current) drug therapy: Secondary | ICD-10-CM

## 2022-07-23 DIAGNOSIS — I4819 Other persistent atrial fibrillation: Secondary | ICD-10-CM

## 2022-07-23 DIAGNOSIS — I5022 Chronic systolic (congestive) heart failure: Secondary | ICD-10-CM

## 2022-07-23 NOTE — Telephone Encounter (Signed)
Attempted to call patient regarding upcoming cardiac CT appointment. Unable to leave a message.  Toniya Rozar RN Navigator Cardiac Imaging [facility] [phone] Office [phone] Cell  

## 2022-07-24 LAB — BASIC METABOLIC PANEL
BUN/Creatinine Ratio: 18 (ref 9–23)
BUN: 27 mg/dL — ABNORMAL HIGH (ref 6–24)
CO2: 22 mmol/L (ref 20–29)
Calcium: 10.2 mg/dL (ref 8.7–10.2)
Chloride: 105 mmol/L (ref 96–106)
Creatinine, Ser: 1.47 mg/dL — ABNORMAL HIGH (ref 0.57–1.00)
Glucose: 87 mg/dL (ref 70–99)
Potassium: 4.3 mmol/L (ref 3.5–5.2)
Sodium: 138 mmol/L (ref 134–144)
eGFR: 42 mL/min/{1.73_m2} — ABNORMAL LOW (ref >59)

## 2022-07-24 LAB — CBC WITH DIFFERENTIAL/PLATELET
Basophils Absolute: 0 10*3/uL (ref 0.0–0.2)
Basos: 1 %
EOS (ABSOLUTE): 0.1 10*3/uL (ref 0.0–0.4)
Eos: 1 %
Hematocrit: 33.1 % — ABNORMAL LOW (ref 34.0–46.6)
Hemoglobin: 11.2 g/dL (ref 11.1–15.9)
Immature Grans (Abs): 0 10*3/uL (ref 0.0–0.1)
Immature Granulocytes: 0 %
Lymphocytes Absolute: 1.2 10*3/uL (ref 0.7–3.1)
Lymphs: 19 %
MCH: 34.4 pg — ABNORMAL HIGH (ref 26.6–33.0)
MCHC: 33.8 g/dL (ref 31.5–35.7)
MCV: 102 fL — ABNORMAL HIGH (ref 79–97)
Monocytes Absolute: 0.6 10*3/uL (ref 0.1–0.9)
Monocytes: 9 %
Neutrophils Absolute: 4.6 10*3/uL (ref 1.4–7.0)
Neutrophils: 70 %
Platelets: 337 10*3/uL (ref 150–450)
RBC: 3.26 x10E6/uL — ABNORMAL LOW (ref 3.77–5.28)
RDW: 12.2 % (ref 11.7–15.4)
WBC: 6.5 10*3/uL (ref 3.4–10.8)

## 2022-07-26 ENCOUNTER — Ambulatory Visit (HOSPITAL_COMMUNITY)
Admission: RE | Admit: 2022-07-26 | Discharge: 2022-07-26 | Disposition: A | Discharge status: Home / Self Care | Payer: Commercial Managed Care - HMO | Source: Ambulatory Visit | Attending: Internal Medicine | Admitting: Internal Medicine

## 2022-07-26 ENCOUNTER — Telehealth (HOSPITAL_COMMUNITY): Payer: Self-pay | Admitting: *Deleted

## 2022-07-26 ENCOUNTER — Encounter: Payer: Self-pay | Admitting: Internal Medicine

## 2022-07-26 DIAGNOSIS — I5022 Chronic systolic (congestive) heart failure: Secondary | ICD-10-CM | POA: Insufficient documentation

## 2022-07-26 DIAGNOSIS — I4891 Unspecified atrial fibrillation: Secondary | ICD-10-CM | POA: Insufficient documentation

## 2022-07-26 DIAGNOSIS — I110 Hypertensive heart disease with heart failure: Secondary | ICD-10-CM | POA: Insufficient documentation

## 2022-07-26 LAB — ECHOCARDIOGRAM COMPLETE
AR max vel: 2.41 cm2
AV Area VTI: 2.42 cm2
AV Area mean vel: 2.36 cm2
AV Mean grad: 5 mmHg
AV Peak grad: 10.4 mmHg
Ao pk vel: 1.61 m/s
Area-P 1/2: 2.54 cm2
S' Lateral: 3.4 cm

## 2022-07-26 NOTE — Telephone Encounter (Signed)
Reaching out to patient to offer assistance regarding upcoming cardiac imaging study; pt verbalizes understanding of appt date/time, parking situation and where to check in, pre-test NPO status  and verified current allergies; name and call back number provided for further questions should they arise ? ?Felicity Penix RN Navigator Cardiac Imaging ?[facility] ?[phone] office ?[phone] cell ? ?

## 2022-07-26 NOTE — Progress Notes (Signed)
  Echocardiogram 2D Echocardiogram has been performed.  Christine Oneal 07/26/2022, 3:59 PM

## 2022-07-27 ENCOUNTER — Ambulatory Visit (HOSPITAL_BASED_OUTPATIENT_CLINIC_OR_DEPARTMENT_OTHER)
Admission: RE | Admit: 2022-07-27 | Discharge: 2022-07-27 | Disposition: A | Discharge status: Home / Self Care | Payer: Commercial Managed Care - HMO | Source: Ambulatory Visit | Attending: Cardiology | Admitting: Cardiology

## 2022-07-27 ENCOUNTER — Encounter (HOSPITAL_BASED_OUTPATIENT_CLINIC_OR_DEPARTMENT_OTHER): Payer: Self-pay

## 2022-07-27 DIAGNOSIS — Z79899 Other long term (current) drug therapy: Secondary | ICD-10-CM | POA: Diagnosis not present

## 2022-07-27 DIAGNOSIS — I5022 Chronic systolic (congestive) heart failure: Secondary | ICD-10-CM | POA: Insufficient documentation

## 2022-07-27 DIAGNOSIS — I4819 Other persistent atrial fibrillation: Secondary | ICD-10-CM | POA: Insufficient documentation

## 2022-07-27 MED ORDER — IOHEXOL 350 MG/ML SOLN
100.0000 mL | Freq: Once | INTRAVENOUS | Status: AC | PRN
  Administered 2022-07-27: 80 mL via INTRAVENOUS

## 2022-08-02 NOTE — Pre-Procedure Instructions (Signed)
Instructed patient on the following items: Arrival time 0515 Nothing to eat or drink after midnight No meds AM of procedure Responsible person to drive you home and stay with you for 24 hrs  Have you missed any doses of anti-coagulant Eliquis- hasn't missed any doses   

## 2022-08-03 ENCOUNTER — Ambulatory Visit (HOSPITAL_BASED_OUTPATIENT_CLINIC_OR_DEPARTMENT_OTHER): Payer: Commercial Managed Care - HMO | Admitting: Certified Registered Nurse Anesthetist

## 2022-08-03 ENCOUNTER — Ambulatory Visit (HOSPITAL_COMMUNITY): Payer: Commercial Managed Care - HMO | Admitting: Certified Registered Nurse Anesthetist

## 2022-08-03 ENCOUNTER — Other Ambulatory Visit: Payer: Self-pay

## 2022-08-03 ENCOUNTER — Ambulatory Visit (HOSPITAL_COMMUNITY)
Admission: RE | Disposition: A | Discharge status: Home / Self Care | Payer: Commercial Managed Care - HMO | Source: Home / Self Care | Attending: Cardiology

## 2022-08-03 ENCOUNTER — Ambulatory Visit (HOSPITAL_COMMUNITY)
Admission: RE | Admit: 2022-08-03 | Discharge: 2022-08-03 | Disposition: A | Discharge status: Home / Self Care | Payer: Commercial Managed Care - HMO | Attending: Cardiology | Admitting: Cardiology

## 2022-08-03 ENCOUNTER — Other Ambulatory Visit (HOSPITAL_COMMUNITY): Payer: Self-pay

## 2022-08-03 DIAGNOSIS — I4891 Unspecified atrial fibrillation: Secondary | ICD-10-CM

## 2022-08-03 DIAGNOSIS — I4819 Other persistent atrial fibrillation: Secondary | ICD-10-CM

## 2022-08-03 DIAGNOSIS — D649 Anemia, unspecified: Secondary | ICD-10-CM | POA: Diagnosis not present

## 2022-08-03 DIAGNOSIS — I5022 Chronic systolic (congestive) heart failure: Secondary | ICD-10-CM | POA: Diagnosis not present

## 2022-08-03 DIAGNOSIS — I110 Hypertensive heart disease with heart failure: Secondary | ICD-10-CM | POA: Insufficient documentation

## 2022-08-03 DIAGNOSIS — Z6841 Body Mass Index (BMI) 40.0 and over, adult: Secondary | ICD-10-CM | POA: Diagnosis not present

## 2022-08-03 DIAGNOSIS — Z79899 Other long term (current) drug therapy: Secondary | ICD-10-CM | POA: Insufficient documentation

## 2022-08-03 DIAGNOSIS — Z7901 Long term (current) use of anticoagulants: Secondary | ICD-10-CM | POA: Diagnosis not present

## 2022-08-03 DIAGNOSIS — I483 Typical atrial flutter: Secondary | ICD-10-CM

## 2022-08-03 DIAGNOSIS — I509 Heart failure, unspecified: Secondary | ICD-10-CM

## 2022-08-03 HISTORY — PX: ATRIAL FIBRILLATION ABLATION: EP1191

## 2022-08-03 LAB — POCT ACTIVATED CLOTTING TIME
Activated Clotting Time: 298 seconds
Activated Clotting Time: 304 seconds

## 2022-08-03 SURGERY — ATRIAL FIBRILLATION ABLATION
Anesthesia: General

## 2022-08-03 MED ORDER — COLCHICINE 0.6 MG PO TABS
0.6000 mg | ORAL_TABLET | Freq: Two times a day (BID) | ORAL | Status: DC
  Administered 2022-08-03: 0.6 mg via ORAL
  Filled 2022-08-03: qty 1

## 2022-08-03 MED ORDER — SODIUM CHLORIDE 0.9% FLUSH: 3.0000 mL | INTRAVENOUS | Status: DC | PRN

## 2022-08-03 MED ORDER — HEPARIN SODIUM (PORCINE) 1000 UNIT/ML IJ SOLN
INTRAMUSCULAR | Status: DC | PRN
  Administered 2022-08-03: 1000 [IU] via INTRAVENOUS

## 2022-08-03 MED ORDER — HEPARIN SODIUM (PORCINE) 1000 UNIT/ML IJ SOLN
INTRAMUSCULAR | Status: DC | PRN
  Administered 2022-08-03: 4000 [IU] via INTRAVENOUS
  Administered 2022-08-03: 16000 [IU] via INTRAVENOUS
  Administered 2022-08-03: 5000 [IU] via INTRAVENOUS

## 2022-08-03 MED ORDER — PROPOFOL 10 MG/ML IV BOLUS
INTRAVENOUS | Status: DC | PRN
  Administered 2022-08-03: 50 mg via INTRAVENOUS
  Administered 2022-08-03: 130 mg via INTRAVENOUS

## 2022-08-03 MED ORDER — SODIUM CHLORIDE 0.9 % IV SOLN: 250.0000 mL | INTRAVENOUS | Status: DC | PRN

## 2022-08-03 MED ORDER — SUGAMMADEX SODIUM 200 MG/2ML IV SOLN
INTRAVENOUS | Status: DC | PRN
  Administered 2022-08-03: 250 mg via INTRAVENOUS

## 2022-08-03 MED ORDER — ONDANSETRON HCL 4 MG/2ML IJ SOLN
INTRAMUSCULAR | Status: DC | PRN
  Administered 2022-08-03: 4 mg via INTRAVENOUS

## 2022-08-03 MED ORDER — ONDANSETRON HCL 4 MG/2ML IJ SOLN: 4.0000 mg | Freq: Four times a day (QID) | INTRAMUSCULAR | Status: DC | PRN

## 2022-08-03 MED ORDER — PANTOPRAZOLE SODIUM 40 MG PO TBEC
40.0000 mg | DELAYED_RELEASE_TABLET | Freq: Every day | ORAL | Status: DC
  Administered 2022-08-03: 40 mg via ORAL
  Filled 2022-08-03: qty 1

## 2022-08-03 MED ORDER — ACETAMINOPHEN 325 MG PO TABS: 650.0000 mg | ORAL_TABLET | ORAL | Status: DC | PRN

## 2022-08-03 MED ORDER — COLCHICINE 0.6 MG PO TABS
0.3000 mg | ORAL_TABLET | Freq: Two times a day (BID) | ORAL | 0 refills | Status: DC
  Filled 2022-08-03: qty 5, 5d supply, fill #0

## 2022-08-03 MED ORDER — PHENYLEPHRINE HCL-NACL 20-0.9 MG/250ML-% IV SOLN
INTRAVENOUS | Status: DC | PRN
  Administered 2022-08-03: 25 ug/min via INTRAVENOUS

## 2022-08-03 MED ORDER — PROTAMINE SULFATE 10 MG/ML IV SOLN
INTRAVENOUS | Status: DC | PRN
  Administered 2022-08-03: 30 mg via INTRAVENOUS

## 2022-08-03 MED ORDER — LIDOCAINE 2% (20 MG/ML) 5 ML SYRINGE
INTRAMUSCULAR | Status: DC | PRN
  Administered 2022-08-03: 60 mg via INTRAVENOUS

## 2022-08-03 MED ORDER — DEXAMETHASONE SODIUM PHOSPHATE 10 MG/ML IJ SOLN
INTRAMUSCULAR | Status: DC | PRN
  Administered 2022-08-03: 10 mg via INTRAVENOUS

## 2022-08-03 MED ORDER — SODIUM CHLORIDE 0.9% FLUSH: 3.0000 mL | Freq: Two times a day (BID) | INTRAVENOUS | Status: DC

## 2022-08-03 MED ORDER — HEPARIN (PORCINE) IN NACL 1000-0.9 UT/500ML-% IV SOLN
INTRAVENOUS | Status: DC | PRN
  Administered 2022-08-03 (×3): 500 mL

## 2022-08-03 MED ORDER — MIDAZOLAM HCL 2 MG/2ML IJ SOLN
INTRAMUSCULAR | Status: AC
  Filled 2022-08-03: qty 2

## 2022-08-03 MED ORDER — PROPOFOL 500 MG/50ML IV EMUL
INTRAVENOUS | Status: DC | PRN
  Administered 2022-08-03: 200 ug/kg/min via INTRAVENOUS
  Administered 2022-08-03 (×2): 150 ug/kg/min via INTRAVENOUS

## 2022-08-03 MED ORDER — SODIUM CHLORIDE 0.9 % IV SOLN: INTRAVENOUS | Status: DC

## 2022-08-03 MED ORDER — FENTANYL CITRATE (PF) 100 MCG/2ML IJ SOLN
INTRAMUSCULAR | Status: AC
  Filled 2022-08-03: qty 2

## 2022-08-03 MED ORDER — ROCURONIUM BROMIDE 10 MG/ML (PF) SYRINGE
PREFILLED_SYRINGE | INTRAVENOUS | Status: DC | PRN
  Administered 2022-08-03 (×2): 50 mg via INTRAVENOUS

## 2022-08-03 MED ORDER — SUGAMMADEX SODIUM 200 MG/2ML IV SOLN: INTRAVENOUS | Status: DC | PRN

## 2022-08-03 MED ORDER — PANTOPRAZOLE SODIUM 40 MG PO TBEC
40.0000 mg | DELAYED_RELEASE_TABLET | Freq: Every day | ORAL | 0 refills | Status: DC
  Filled 2022-08-03: qty 30, 30d supply, fill #0

## 2022-08-03 MED ORDER — APIXABAN 5 MG PO TABS
5.0000 mg | ORAL_TABLET | Freq: Two times a day (BID) | ORAL | Status: DC
  Administered 2022-08-03: 5 mg via ORAL
  Filled 2022-08-03: qty 1

## 2022-08-03 MED ORDER — FENTANYL CITRATE (PF) 250 MCG/5ML IJ SOLN
INTRAMUSCULAR | Status: DC | PRN
  Administered 2022-08-03: 100 ug via INTRAVENOUS

## 2022-08-03 MED ORDER — MIDAZOLAM HCL 2 MG/2ML IJ SOLN
INTRAMUSCULAR | Status: DC | PRN
  Administered 2022-08-03: 2 mg via INTRAVENOUS

## 2022-08-03 SURGICAL SUPPLY — 19 items
BAG SNAP BAND KOVER 36X36 (MISCELLANEOUS) IMPLANT
CATH 8FR REPROCESSED SOUNDSTAR (CATHETERS) ×1 IMPLANT
CATH 8FR SOUNDSTAR REPROCESSED (CATHETERS) IMPLANT
CATH ABLAT QDOT MICRO BI TC FJ (CATHETERS) IMPLANT
CATH OCTARAY 2.0 F 3-3-3-3-3 (CATHETERS) IMPLANT
CATH S-M CIRCA TEMP PROBE (CATHETERS) IMPLANT
CATH WEB BI DIR CSDF CRV REPRO (CATHETERS) IMPLANT
CLOSURE PERCLOSE PROSTYLE (VASCULAR PRODUCTS) IMPLANT
COVER SWIFTLINK CONNECTOR (BAG) ×1 IMPLANT
PACK EP LATEX FREE (CUSTOM PROCEDURE TRAY) ×1
PACK EP LF (CUSTOM PROCEDURE TRAY) ×1 IMPLANT
PAD DEFIB RADIO PHYSIO CONN (PAD) ×1 IMPLANT
PATCH CARTO3 (PAD) IMPLANT
SHEATH BAYLIS TRANSSEPTAL 98CM (NEEDLE) IMPLANT
SHEATH CARTO VIZIGO SM CVD (SHEATH) IMPLANT
SHEATH PINNACLE 8F 10CM (SHEATH) IMPLANT
SHEATH PINNACLE 9F 10CM (SHEATH) IMPLANT
SHEATH PROBE COVER 6X72 (BAG) IMPLANT
TUBING SMART ABLATE COOLFLOW (TUBING) IMPLANT

## 2022-08-03 NOTE — Discharge Instructions (Addendum)
Post procedure care instructions No driving for 4 days. No lifting over 5 lbs for 1 week. No vigorous or sexual activity for 1 week. You may return to work/your usual activities on 08/11/22. Keep procedure site clean & dry. If you notice increased pain, swelling, bleeding or pus, call/return!  You may shower after 24 hours, but no soaking in baths/hot tubs/pools for 1 week.   You have an appointment set up with the [facility].  Multiple studies have shown that being followed by a dedicated atrial fibrillation clinic in addition to the standard care you receive from your other physicians improves health. We believe that enrollment in the atrial fibrillation clinic will allow [place] to better care for you.   The phone number to the [facility] is [phone]. The clinic is staffed Monday through Friday from 8:30am to 5pm.  Directions: The clinic is located in the [facility], [place] the hospital at the MAIN ENTRANCE "A", use [address] to the 6th floor.  Registration desk to the right of elevators on 6th floor  If you have any trouble locating the clinic, please don't hesitate to call [phone].   Cardiac Ablation, Care After  This sheet gives you information about how to care for yourself after your procedure. Your health care provider may also give you more specific instructions. If you have problems or questions, contact your health care provider. What can I expect after the procedure? After the procedure, it is common to have: Bruising around your puncture site. Tenderness around your puncture site. Skipped heartbeats. If you had an atrial fibrillation ablation, you may have atrial fibrillation during the first several months after your procedure.  Tiredness (fatigue).  Follow these instructions at home: Puncture site care  Follow instructions from your health care provider about how to take care of your puncture site. Make sure  you: If present, leave stitches (sutures), skin glue, or adhesive strips in place. These skin closures may need to stay in place for up to 2 weeks. If adhesive strip edges start to loosen and curl up, you may trim the loose edges. Do not remove adhesive strips completely unless your health care provider tells you to do that. If a large square bandage is present, this may be removed 24 hours after surgery.  Check your puncture site every day for signs of infection. Check for: Redness, swelling, or pain. Fluid or blood. If your puncture site starts to bleed, lie down on your back, apply firm pressure to the area, and contact your health care provider. Warmth. Pus or a bad smell. A pea or small marble sized lump at the site is normal and can take up to three months to resolve.  Driving Do not drive for at least 4 days after your procedure or however long your health care provider recommends. (Do not resume driving if you have previously been instructed not to drive for other health reasons.) Do not drive or use heavy machinery while taking prescription pain medicine. Activity Avoid activities that take a lot of effort for at least 7 days after your procedure. Do not lift anything that is heavier than 5 lb (4.5 kg) for one week.  No sexual activity for 1 week.  Return to your normal activities as told by your health care provider. Ask your health care provider what activities are safe for you. General instructions Take over-the-counter and prescription medicines only as told by your health care provider. Do not use  any products that contain nicotine or tobacco, such as cigarettes and e-cigarettes. If you need help quitting, ask your health care provider. You may shower after 24 hours, but Do not take baths, swim, or use a hot tub for 1 week.  Do not drink alcohol for 24 hours after your procedure. Keep all follow-up visits as told by your health care provider. This is important. Contact a health  care provider if: You have redness, mild swelling, or pain around your puncture site. You have fluid or blood coming from your puncture site that stops after applying firm pressure to the area. Your puncture site feels warm to the touch. You have pus or a bad smell coming from your puncture site. You have a fever. You have chest pain or discomfort that spreads to your neck, jaw, or arm. You have chest pain that is worse with lying on your back or taking a deep breath. You are sweating a lot. You feel nauseous. You have a fast or irregular heartbeat. You have shortness of breath. You are dizzy or light-headed and feel the need to lie down. You have pain or numbness in the arm or leg closest to your puncture site. Get help right away if: Your puncture site suddenly swells. Your puncture site is bleeding and the bleeding does not stop after applying firm pressure to the area. These symptoms may represent a serious problem that is an emergency. Do not wait to see if the symptoms will go away. Get medical help right away. Call your local emergency services (911 in the [place] Do not drive yourself to the hospital. Summary After the procedure, it is normal to have bruising and tenderness at the puncture site in your groin, neck, or forearm. Check your puncture site every day for signs of infection. Get help right away if your puncture site is bleeding and the bleeding does not stop after applying firm pressure to the area. This is a medical emergency. This information is not intended to replace advice given to you by your health care provider. Make sure you discuss any questions you have with your health care provider.

## 2022-08-03 NOTE — Anesthesia Preprocedure Evaluation (Signed)
Anesthesia Evaluation  Patient identified by MRN, date of birth, ID band Patient awake    Reviewed: Allergy & Precautions, NPO status , Patient's Chart, lab work & pertinent test results  History of Anesthesia Complications Negative for: history of anesthetic complications  Airway Mallampati: I  TM Distance: >3 FB Neck ROM: Full    Dental  (+) Dental Advisory Given, Teeth Intact, Missing,    Pulmonary neg pulmonary ROS, neg shortness of breath, neg COPD, neg recent URI   breath sounds clear to auscultation       Cardiovascular hypertension, Pt. on medications and Pt. on home beta blockers (-) angina +CHF  (-) Past MI + dysrhythmias Atrial Fibrillation  Rhythm:Regular  1. Left ventricular ejection fraction, by estimation, is 55 to 60%. The  left ventricle has normal function. The left ventricle has no regional  wall motion abnormalities. There is mild left ventricular hypertrophy.  Left ventricular diastolic parameters  are indeterminate.   2. Right ventricular systolic function is normal. The right ventricular  size is normal. Tricuspid regurgitation signal is inadequate for assessing  PA pressure.   3. No evidence of mitral valve regurgitation.   4. The aortic valve is grossly normal. Aortic valve regurgitation is not  visualized.   5. The inferior vena cava is normal in size with greater than 50%  respiratory variability, suggesting right atrial pressure of 3 mmHg.   Comparison(s): No significant change from prior study   Ao = 156/89 (112) LV =135/7 RA = 2 RV = 27/6 PA = 31/8 (20) PCW = 7 Fick cardiac output/index = 5.6/2.5 PVR = 2.2 WU FA sat = 99% PA sat = 67%. 69% SVC sat = 72%   Assessment: 1. Normal coronary arteries 2. 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 Well-compensated hemodynamics after large-volume diuresis .     Neuro/Psych negative neurological ROS  negative psych ROS    GI/Hepatic negative GI ROS, Neg liver ROS,,,  Endo/Other  negative endocrine ROS    Renal/GU Renal InsufficiencyRenal diseaseLab Results      Component                Value               Date                      CREATININE               1.47 (H)            07/23/2022                Musculoskeletal negative musculoskeletal ROS (+)    Abdominal   Peds  Hematology  (+) Blood dyscrasia, anemia Lab Results      Component                Value               Date                      WBC                      6.5                 07/23/2022                HGB  11.2                07/23/2022                HCT                      33.1 (L)            07/23/2022                MCV                      102 (H)             07/23/2022                PLT                      337                 07/23/2022             eliquis   Anesthesia Other Findings   Reproductive/Obstetrics                              Anesthesia Physical Anesthesia Plan  ASA: 3  Anesthesia Plan: General   Post-op Pain Management: Minimal or no pain anticipated   Induction: Intravenous  PONV Risk Score and Plan: 3 and Ondansetron, Dexamethasone, Propofol infusion and TIVA  Airway Management Planned: Oral ETT  Additional Equipment: None  Intra-op Plan:   Post-operative Plan: Extubation in OR  Informed Consent: I have reviewed the patients History and Physical, chart, labs and discussed the procedure including the risks, benefits and alternatives for the proposed anesthesia with the patient or authorized representative who has indicated his/her understanding and acceptance.     Dental advisory given  Plan Discussed with: CRNA  Anesthesia Plan Comments:          Anesthesia Quick Evaluation

## 2022-08-03 NOTE — Transfer of Care (Signed)
Immediate Anesthesia Transfer of Care Note  Patient: Christine Oneal  Procedure(s) Performed: ATRIAL FIBRILLATION ABLATION  Patient Location: PACU  Anesthesia Type:General  Level of Consciousness: drowsy  Airway & Oxygen Therapy: Patient Spontanous Breathing and Patient connected to nasal cannula oxygen  Post-op Assessment: Report given to RN and Post -op Vital signs reviewed and stable  Post vital signs: Reviewed and stable  Last Vitals:  Vitals Value Taken Time  BP 117/57 08/03/22 1036  Temp 36.6 C 08/03/22 1028  Pulse 63 08/03/22 1036  Resp 17 08/03/22 1036  SpO2 99 % 08/03/22 1036  Vitals shown include unvalidated device data.  Last Pain:  Vitals:   08/03/22 1028  TempSrc: Temporal  PainSc: 0-No pain      Patients Stated Pain Goal: 3 (77/41/42 3953)  Complications: There were no known notable events for this encounter.

## 2022-08-03 NOTE — Anesthesia Procedure Notes (Signed)
Procedure Name: Intubation Date/Time: 08/03/2022 7:57 AM  Performed by: Bryson Corona, CRNAPre-anesthesia Checklist: Patient identified, Emergency Drugs available, Suction available and Patient being monitored Patient Re-evaluated:Patient Re-evaluated prior to induction Oxygen Delivery Method: Circle System Utilized Preoxygenation: Pre-oxygenation with 100% oxygen Induction Type: IV induction Ventilation: Mask ventilation without difficulty Laryngoscope Size: Mac and 3 Grade View: Grade I Tube type: Oral Tube size: 7.0 mm Number of attempts: 1 Airway Equipment and Method: Stylet Placement Confirmation: ETT inserted through vocal cords under direct vision, positive ETCO2 and breath sounds checked- equal and bilateral Secured at: 22 cm Tube secured with: Tape Dental Injury: Teeth and Oropharynx as per pre-operative assessment

## 2022-08-03 NOTE — H&P (Signed)
Electrophysiology Office Follow up Visit Note:     Date:  08/03/2022    ID:  Christine Oneal, DOB 10-24-1970, MRN [id number]   PCP:  Hoyt Koch, MD       [facility] Cardiologist:  None  [facility] Electrophysiologist:  Vickie Epley, MD      Interval History:     Christine Oneal is a 52 y.o. female who presents for a follow up visit. They were last seen in clinic 12/25/2021 for persistent AF by Renee. I have previously met the patient. She takes amiodarone for rhythm control but the long term plan has previously been to pursue ablation.   Today, the patient states states that she has been alright, she believes that her rhythm was off last week but has generally been more controlled recently   She has lost 10 pounds since the last visit, she says it's due to diet and staying more active.   She is wondering if ablation is an option for her which we discussed.   She denies any chest pain, shortness of breath, or peripheral edema. No lightheadedness, headaches, syncope, orthopnea, or PND.    Presents for PVI today. Procedure reviewed.   Objective      Past Medical History:  Diagnosis Date   Abnormal Pap smear of cervix     Allergy     Anemia     CHF (congestive heart failure) (HCC)     Fibroid     Hypertension     Leukocytosis, unspecified 10/18/2013   Obesity     Plantar fasciitis             Past Surgical History:  Procedure Laterality Date   CARDIOVERSION N/A 05/12/2021    Procedure: CARDIOVERSION;  Surgeon: Jolaine Artist, MD;  Location: [facility] ENDOSCOPY;  Service: Cardiovascular;  Laterality: N/A;   CARDIOVERSION N/A 05/29/2021    Procedure: CARDIOVERSION;  Surgeon: Jolaine Artist, MD;  Location: [facility] ENDOSCOPY;  Service: Cardiovascular;  Laterality: N/A;   COLPOSCOPY       RIGHT/LEFT HEART CATH AND CORONARY ANGIOGRAPHY N/A 05/11/2021    Procedure: RIGHT/LEFT HEART CATH AND CORONARY ANGIOGRAPHY;  Surgeon: Jolaine Artist, MD;  Location:  [facility];  Service: Cardiovascular;  Laterality: N/A;   TEE WITHOUT CARDIOVERSION N/A 05/12/2021    Procedure: TRANSESOPHAGEAL ECHOCARDIOGRAM (TEE);  Surgeon: Jolaine Artist, MD;  Location: [place];  Service: Cardiovascular;  Laterality: N/A;   TUBAL LIGATION   2001      Current Medications: Active Medications      Current Meds  Medication Sig   acetaminophen (TYLENOL) 500 MG tablet Take 1,000 mg by mouth every 6 (six) hours as needed for moderate pain or headache.   amiodarone (PACERONE) 200 MG tablet Take 1 tablet (200 mg total) by mouth daily.   apixaban (ELIQUIS) 5 MG TABS tablet Take 1 tablet (5 mg total) by mouth 2 (two) times daily.   azelastine (ASTELIN) 0.1 % nasal spray Place 1 spray into both nostrils 2 (two) times daily. Use in each nostril as directed   benzonatate (TESSALON) 100 MG capsule Take 1 capsule (100 mg total) by mouth 3 (three) times daily as needed for cough.   carvedilol (COREG) 6.25 MG tablet TAKE 1 TABLET BY MOUTH TWICE A DAY   Cholecalciferol (VITAMIN D3) 25 MCG (1000 UT) CAPS Take 1 capsule (1,000 Units total) by mouth daily.   Dextromethorphan-guaiFENesin ([employer] MUCUS DM EXTENDED RELEASE) 30-600 MG TB12 TAKE 1 TABLET  BY MOUTH TWICE A DAY (Patient taking differently: As needed)   FARXIGA 10 MG TABS tablet TAKE 1 TABLET BY MOUTH EVERY DAY   fluticasone (FLONASE) 50 MCG/ACT nasal spray Place 2 sprays into both nostrils daily. (Patient taking differently: Place 2 sprays into both nostrils daily. As needed)   hydrALAZINE (APRESOLINE) 50 MG tablet Take 1 tablet (50 mg total) by mouth 3 (three) times daily.   hydrocortisone (ANUSOL-HC) 25 MG suppository Place 1 suppository (25 mg total) rectally 2 (two) times daily.   hydrocortisone 2.5 % cream Apply topically 2 (two) times daily.   isosorbide mononitrate (IMDUR) 30 MG 24 hr tablet TAKE 1 TABLET BY MOUTH EVERY DAY   losartan (COZAAR) 50 MG tablet Take 1 tablet (50 mg total) by mouth in the morning and  at bedtime.   potassium chloride SA (KLOR-CON M) 20 MEQ tablet Take 1 tablet (20 mEq total) by mouth as needed (Take when taking Torsemide).   Probiotic CHEW Chew 2 capsules by mouth daily.   spironolactone (ALDACTONE) 25 MG tablet TAKE 1 TABLET (25 MG TOTAL) BY MOUTH DAILY.   torsemide (DEMADEX) 20 MG tablet Take 1 tablet (20 mg total) by mouth as needed (when weight is up or notes swelling).        Allergies:   Lisinopril-hydrochlorothiazide and Other    Social History         Socioeconomic History   Marital status: Divorced      Spouse name: Not on file   Number of children: 4   Years of education: Not on file   Highest education level: Not on file  Occupational History   Occupation: CNA      Employer: Information systems manager   Tobacco Use   Smoking status: Never   Smokeless tobacco: Never  Vaping Use   Vaping Use: Never used  Substance and Sexual Activity   Alcohol use: No   Drug use: No   Sexual activity: Yes      Partners: Male      Birth control/protection: Surgical      Comment: BTL  Other Topics Concern   Not on file  Social History Narrative    Sophiagrace has 4 children. Three live at home with her.    Social Determinants of Health        Financial Resource Strain: Not on file  Food Insecurity: No Food Insecurity (05/31/2021)    Hunger Vital Sign     Worried About Running Out of Food in the Last Year: Never true     Ran Out of Food in the Last Year: Never true  Transportation Needs: No Transportation Needs (05/31/2021)    PRAPARE - Armed forces logistics/support/administrative officer (Medical): No     Lack of Transportation (Non-Medical): No  Physical Activity: Not on file  Stress: Not on file  Social Connections: Not on file      Family History: The patient's family history includes Cervical cancer in her mother; Diabetes in her sister; Hypertension in her brother, brother, father, and mother; Stroke in her father; Uterine cancer in her maternal grandmother.   ROS:   Please  see the history of present illness.      All other systems reviewed and are negative.   EKGs/Labs/Other Studies Reviewed:     The following studies were reviewed today:   09/09/2021 echo EF 50 RV normal Mild MR Moderately dilated LA       Recent Labs: 05/29/2021: Hemoglobin 10.1; Platelets 390 08/25/2021:  TSH 2.050 09/09/2021: ALT 25; B Natriuretic Peptide 30.6 11/24/2021: Magnesium 2.2 03/02/2022: BUN 25; Creatinine, Ser 1.45; Potassium 4.1; Sodium 138  Recent Lipid Panel Labs (Brief)          Component Value Date/Time    CHOL 193 05/25/2021 1414    CHOL 202 (H) 02/05/2020 1417    TRIG 102.0 05/25/2021 1414    HDL 47.60 05/25/2021 1414    HDL 46 02/05/2020 1417    CHOLHDL 4 05/25/2021 1414    VLDL 20.4 05/25/2021 1414    LDLCALC 125 (H) 05/25/2021 1414    LDLCALC 139 (H) 02/05/2020 1417        Physical Exam:     VS:  BP 125/66   Pulse 63   Ht '5\' 3"'$  (1.6 m)   Wt 245 lb (111.1 kg)   SpO2 99%   BMI 43.40 kg/m         Wt  from Last 3 Encounters:  05/04/22 245 lb (111.1 kg)  04/16/22 250 lb (113.4 kg)  04/08/22 251 lb 9.6 oz (114.1 kg)      GEN: Well nourished, well developed in no acute distress HEENT: Normal NECK: No JVD; No carotid bruits LYMPHATICS: No lymphadenopathy CARDIAC: RRR, no murmurs, rubs, gallops RESPIRATORY:  Clear to auscultation without rales, wheezing or rhonchi  ABDOMEN: Soft, non-tender, non-distended MUSCULOSKELETAL:  No edema; No deformity  SKIN: Warm and dry NEUROLOGIC:  Alert and oriented x 3 PSYCHIATRIC:  Normal affect      Assessment ASSESSMENT:     1. Chronic systolic CHF (congestive heart failure) (HCC)   2. Persistent atrial fibrillation ([place])   3. Morbid obesity ([place])   4. Encounter for long-term (current) use of high-risk medication     PLAN:     In order of problems listed above:   #chronic systolic heart failure NYHA II. Warm and dry. Rhythm control strategy indicated. Continue current medications   #AF,  persistent Rhythm control indicated as above given history of tachy medicated cardiomyopathy   Continue eliquis for stroke ppx.   Continue amiodarone. Check CMP, TSH and FT4 today.     Discussed treatment options today for her AF including antiarrhythmic drug therapy and ablation. Discussed risks, recovery and likelihood of success. Discussed potential need for repeat ablation procedures and antiarrhythmic drugs after an initial ablation. They wish to proceed with scheduling.   Risk, benefits, and alternatives to EP study and radiofrequency ablation for afib were also discussed in detail today. These risks include but are not limited to stroke, bleeding, vascular damage, tamponade, perforation, damage to the esophagus, lungs, and other structures, pulmonary vein stenosis, worsening renal function, and death. The patient understands these risk and wishes to proceed.  We will therefore proceed with catheter ablation at the next available time.  Carto, ICE, anesthesia are requested for the procedure.  Will also obtain CT PV protocol prior to the procedure to exclude LAA thrombus and further evaluate atrial anatomy.   Plan for PVI today. Procedure reviewed.   Signed, Lars Mage, MD, [facility], [facility] 08/03/2022 Electrophysiology [facility]

## 2022-08-04 ENCOUNTER — Encounter (HOSPITAL_COMMUNITY): Payer: Self-pay | Admitting: Cardiology

## 2022-08-04 MED FILL — Fentanyl Citrate Preservative Free (PF) Inj 100 MCG/2ML: INTRAMUSCULAR | Qty: 2 | Status: AC

## 2022-08-04 NOTE — Anesthesia Postprocedure Evaluation (Signed)
Anesthesia Post Note  Patient: Christine Oneal  Procedure(s) Performed: ATRIAL FIBRILLATION ABLATION     Patient location during evaluation: Cath Lab Anesthesia Type: General Level of consciousness: awake and alert Pain management: pain level controlled Vital Signs Assessment: post-procedure vital signs reviewed and stable Respiratory status: spontaneous breathing, nonlabored ventilation and respiratory function stable Cardiovascular status: blood pressure returned to baseline and stable Postop Assessment: no apparent nausea or vomiting Anesthetic complications: no   There were no known notable events for this encounter.  Last Vitals:  Vitals:   08/03/22 1400 08/03/22 1500  BP: 118/73 117/66  Pulse: 61 65  Resp: 18 12  Temp:    SpO2: 100% 99%    Last Pain:  Vitals:   08/03/22 1105  TempSrc:   PainSc: 0-No pain                 [name]

## 2022-08-05 ENCOUNTER — Telehealth: Payer: Commercial Managed Care - HMO | Admitting: Internal Medicine

## 2022-08-05 NOTE — Progress Notes (Signed)
Virtual Visit via Video Note  I connected with Christine Oneal on 08/05/22 at 10:40 AM EST by a video enabled telemedicine application and telephone but she was unable to be reached and no voicemail was set up to leave a message.   Hoyt Koch, MD

## 2022-08-07 ENCOUNTER — Telehealth: Payer: Self-pay | Admitting: Physician Assistant

## 2022-08-07 NOTE — Telephone Encounter (Signed)
Patient called requesting a refill on her colchicine.  It turns out that she thought she was supposed to keep taking it but is not having a great deal of chest pain.  Has occasional discomfort that is relieved by Tylenol.  I advised that we would hold off on refilling the colchicine for now and if she gets additional symptoms please call us back.  She states she will keep her follow-up appointment on 2/20.  Rosaria Ferries, PA-C 08/07/2022 10:59 AM

## 2022-08-09 ENCOUNTER — Other Ambulatory Visit (HOSPITAL_COMMUNITY): Payer: Self-pay

## 2022-08-09 ENCOUNTER — Telehealth: Payer: Commercial Managed Care - HMO | Admitting: Internal Medicine

## 2022-08-16 ENCOUNTER — Ambulatory Visit: Payer: Commercial Managed Care - HMO | Admitting: Internal Medicine

## 2022-08-17 ENCOUNTER — Ambulatory Visit: Payer: Commercial Managed Care - HMO | Admitting: Internal Medicine

## 2022-08-19 ENCOUNTER — Encounter (HOSPITAL_COMMUNITY): Payer: Self-pay | Admitting: *Deleted

## 2022-08-23 ENCOUNTER — Encounter: Payer: Self-pay | Admitting: Internal Medicine

## 2022-08-23 ENCOUNTER — Ambulatory Visit (INDEPENDENT_AMBULATORY_CARE_PROVIDER_SITE_OTHER): Payer: Medicaid Other

## 2022-08-23 ENCOUNTER — Ambulatory Visit (INDEPENDENT_AMBULATORY_CARE_PROVIDER_SITE_OTHER): Payer: Commercial Managed Care - HMO | Admitting: Internal Medicine

## 2022-08-23 VITALS — BP 120/80 | HR 57 | Temp 98.0°F | Ht 63.0 in | Wt 232.0 lb

## 2022-08-23 DIAGNOSIS — G8929 Other chronic pain: Secondary | ICD-10-CM | POA: Diagnosis not present

## 2022-08-23 DIAGNOSIS — M25562 Pain in left knee: Secondary | ICD-10-CM

## 2022-08-23 DIAGNOSIS — N946 Dysmenorrhea, unspecified: Secondary | ICD-10-CM | POA: Diagnosis not present

## 2022-08-23 DIAGNOSIS — R2681 Unsteadiness on feet: Secondary | ICD-10-CM | POA: Insufficient documentation

## 2022-08-23 DIAGNOSIS — M79672 Pain in left foot: Secondary | ICD-10-CM

## 2022-08-23 DIAGNOSIS — N3946 Mixed incontinence: Secondary | ICD-10-CM

## 2022-08-23 DIAGNOSIS — R32 Unspecified urinary incontinence: Secondary | ICD-10-CM | POA: Insufficient documentation

## 2022-08-23 DIAGNOSIS — I5022 Chronic systolic (congestive) heart failure: Secondary | ICD-10-CM

## 2022-08-23 DIAGNOSIS — M25561 Pain in right knee: Secondary | ICD-10-CM | POA: Diagnosis not present

## 2022-08-23 NOTE — Assessment & Plan Note (Signed)
Not having heavy periods and still some irregular periods. Hg has increase to 11.2 from in the 10s recently which shows improvement.

## 2022-08-23 NOTE — Patient Instructions (Signed)
Let us know if you are interested in doing anything for the incontinence or the hemorrhoids we can get that going.

## 2022-08-23 NOTE — Assessment & Plan Note (Signed)
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 She was told by prior PCP or cardiology (unsure) that she could not do that job in the future. 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 She is struggling with knee pain and gait instability which limits her more at this time.

## 2022-08-23 NOTE — Assessment & Plan Note (Signed)
This is limiting her ability to get up from the floor and stability. She has had some near falls. Referral to sports medicine. She is aware that weight is contributory. She may need assessment for injection, PT, brace etc to help with balance and stability to help her feel confident in her ability to work.

## 2022-08-23 NOTE — Assessment & Plan Note (Signed)
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 This is contributing to her inability that she feels unable to work.

## 2022-08-23 NOTE — Progress Notes (Signed)
   Subjective:   Patient ID: Christine Oneal, female    DOB: 07-27-1970, 52 y.o.   MRN: [id number]  HPI The patient is a 52 YO female coming in for several concerns  Review of Systems  Constitutional:  Positive for activity change and fatigue.  HENT: Negative.    Eyes: Negative.   Respiratory:  Negative for cough, chest tightness and shortness of breath.   Cardiovascular:  Negative for chest pain, palpitations and leg swelling.  Gastrointestinal:  Negative for abdominal distention, abdominal pain, constipation, diarrhea, nausea and vomiting.  Genitourinary:        Urinary incontinence  Musculoskeletal:  Positive for arthralgias, gait problem and myalgias.  Skin: Negative.   Psychiatric/Behavioral: Negative.      Objective:  Physical Exam Constitutional:      Appearance: She is well-developed. She is obese.  HENT:     Head: Normocephalic and atraumatic.  Cardiovascular:     Rate and Rhythm: Normal rate and regular rhythm.  Pulmonary:     Effort: Pulmonary effort is normal. No respiratory distress.     Breath sounds: Normal breath sounds. No wheezing or rales.  Abdominal:     General: Bowel sounds are normal. There is no distension.     Palpations: Abdomen is soft.     Tenderness: There is no abdominal tenderness. There is no rebound.  Musculoskeletal:        General: Tenderness present.     Cervical back: Normal range of motion.  Skin:    General: Skin is warm and dry.  Neurological:     Mental Status: She is alert and oriented to person, place, and time.     Coordination: Coordination normal.     Vitals:   08/23/22 0956  BP: 120/80  Pulse: (!) 57  Temp: 98 F (36.7 C)  TempSrc: Oral  SpO2: 99%  Weight: 232 lb (105.2 kg)  Height: '5\' 3"'$  (1.6 m)    Assessment & Plan:  Visit time 25 minutes in face to face communication with patient and coordination of care, additional 10 minutes spent in record review, coordination or care, ordering tests,  communicating/referring to other healthcare professionals, documenting in medical records all on the same day of the visit for total time 35 minutes spent on the visit.

## 2022-08-23 NOTE — Assessment & Plan Note (Signed)
With urinary incontinence for some time we have paperwork for incontinence supplies. We discussed medications and she is unsure if she wants to try those at this time. She is on spironolactone, farxiga, and torsemide all of which have diuretic capabilities and may decrease the effectiveness of urinary incontinence medications. She will continue to need incontinence supplies and will let us know if she wants to try medication for UI.

## 2022-08-26 NOTE — Progress Notes (Signed)
I, Christine Oneal, CMA acting as a Education administrator for Christine Leader, MD.  Subjective:    CC: Bilat knee pain  HPI: Pt is a 52 y/o female c/o bilat knee pain x several months, > 6 weeks. L knee pain > right. Pt locates pain to anterior aspect radiating into the lower leg. Occasional popping. Pt notes her knee pain is making it hard to get up from the floor, feels instability, and has had some near falls.  Pain is worse with standing from a seated position and prolonged standing.  She also notes pain going up and down stairs and prolonged sitting.  Knee swelling: YES Mechanical symptoms: YES Radiates: YES Aggravates: ascending stairs/up hill, cold Treatments tried: Tylenol with short-term relief  Pertinent review of Systems: No fevers or chills  Relevant historical information: Heart failure.  A-fib.   Objective:    Vitals:   08/27/22 1059  BP: 104/64  Pulse: (!) 103  SpO2: 99%   General: Well Developed, well nourished, and in no acute distress.   MSK: Knees bilaterally mild quad atrophy otherwise normal-appearing Normal motion with crepitation.  Nontender. Intact strength some pain with resisted knee extension. Stable ligamentous exam.  Lab and Radiology Results  X-ray images bilateral knees obtained today personally independently interpreted.  Right knee: Mild medial compartment DJD.  Mild patellofemoral DJD.  No acute fracture.  Left knee: Mild medial compartment and patellofemoral DJD.  No acute fracture.  Await formal radiology review.    Impression and Recommendations:    Assessment and Plan: 53 y.o. female with bilateral anterior knee pain.  Pain thought to be due predominantly to patellofemoral pain syndrome/chondromalacia patella.  Differential diagnosis does include lumbar radiculopathy but this is less likely. Plan to refer to physical therapy to work on quad strength and hip abduction strength.  She lives in [place] so we will use a physical therapy  location there.  Also recommend Voltaren gel and Tylenol.  Avoid oral NSAIDs given her heart disease. We talked medication dosing and safety. We also had steroid injections.  Elected to avoid injections for now and save that if not better.  Return in about 8 weeks.  Return sooner if needed.    PDMP not reviewed this encounter. Orders Placed This Encounter  Procedures   DG Knee AP/LAT W/Sunrise Left    Standing Status:   Future    Number of Occurrences:   1    Standing Expiration Date:   09/24/2022    Order Specific Question:   Reason for Exam (SYMPTOM  OR DIAGNOSIS REQUIRED)    Answer:   Bilateral knee pain    Order Specific Question:   Preferred imaging location?    Answer:   [facility]    Order Specific Question:   Is patient pregnant?    Answer:   No   DG Knee AP/LAT W/Sunrise Right    Standing Status:   Future    Number of Occurrences:   1    Standing Expiration Date:   09/24/2022    Order Specific Question:   Reason for Exam (SYMPTOM  OR DIAGNOSIS REQUIRED)    Answer:   Bilateral knee pain    Order Specific Question:   Preferred imaging location?    Answer:   [facility]    Order Specific Question:   Is patient pregnant?    Answer:   No   Ambulatory referral to Physical Therapy    Referral Priority:   Routine  Referral Type:   Physical Medicine    Referral Reason:   Specialty Services Required    Requested Specialty:   Physical Therapy    Number of Visits Requested:   1   No orders of the defined types were placed in this encounter.   Discussed warning signs or symptoms. Please see discharge instructions. Patient expresses understanding.   [email or web address]

## 2022-08-27 ENCOUNTER — Ambulatory Visit (INDEPENDENT_AMBULATORY_CARE_PROVIDER_SITE_OTHER): Payer: Commercial Managed Care - HMO

## 2022-08-27 ENCOUNTER — Encounter: Payer: Self-pay | Admitting: Family Medicine

## 2022-08-27 ENCOUNTER — Ambulatory Visit (INDEPENDENT_AMBULATORY_CARE_PROVIDER_SITE_OTHER): Payer: Commercial Managed Care - HMO | Admitting: Family Medicine

## 2022-08-27 ENCOUNTER — Ambulatory Visit: Payer: Self-pay

## 2022-08-27 VITALS — BP 104/64 | HR 103 | Ht 63.0 in | Wt 231.2 lb

## 2022-08-27 DIAGNOSIS — M25561 Pain in right knee: Secondary | ICD-10-CM

## 2022-08-27 DIAGNOSIS — G8929 Other chronic pain: Secondary | ICD-10-CM

## 2022-08-27 DIAGNOSIS — M25562 Pain in left knee: Secondary | ICD-10-CM | POA: Diagnosis not present

## 2022-08-27 DIAGNOSIS — M1712 Unilateral primary osteoarthritis, left knee: Secondary | ICD-10-CM | POA: Diagnosis not present

## 2022-08-27 NOTE — Patient Instructions (Addendum)
Thank you for coming in today.   Please use Voltaren gel (Generic Diclofenac Gel) up to 4x daily for pain as needed.  This is available over-the-counter as both the name brand Voltaren gel and the generic diclofenac gel.   I've referred you to Physical Therapy.  Let us know if you don't hear from them in one week.   Recheck in about 8 weeks.   Let me know sooner if this is not working.

## 2022-08-30 NOTE — Progress Notes (Signed)
Right knee x-ray shows some arthritis changes

## 2022-08-31 ENCOUNTER — Encounter (HOSPITAL_COMMUNITY): Payer: Self-pay | Admitting: Nurse Practitioner

## 2022-08-31 ENCOUNTER — Ambulatory Visit (HOSPITAL_COMMUNITY)
Admission: RE | Admit: 2022-08-31 | Discharge: 2022-08-31 | Disposition: A | Discharge status: Home / Self Care | Payer: Commercial Managed Care - HMO | Source: Ambulatory Visit | Attending: Nurse Practitioner | Admitting: Nurse Practitioner

## 2022-08-31 VITALS — BP 124/70 | HR 55 | Ht 63.0 in | Wt 228.6 lb

## 2022-08-31 DIAGNOSIS — Z7901 Long term (current) use of anticoagulants: Secondary | ICD-10-CM | POA: Diagnosis not present

## 2022-08-31 DIAGNOSIS — Z79899 Other long term (current) drug therapy: Secondary | ICD-10-CM | POA: Insufficient documentation

## 2022-08-31 DIAGNOSIS — I4891 Unspecified atrial fibrillation: Secondary | ICD-10-CM | POA: Diagnosis not present

## 2022-08-31 DIAGNOSIS — D6869 Other thrombophilia: Secondary | ICD-10-CM | POA: Diagnosis not present

## 2022-08-31 DIAGNOSIS — I110 Hypertensive heart disease with heart failure: Secondary | ICD-10-CM | POA: Insufficient documentation

## 2022-08-31 DIAGNOSIS — I4819 Other persistent atrial fibrillation: Secondary | ICD-10-CM | POA: Insufficient documentation

## 2022-08-31 DIAGNOSIS — I509 Heart failure, unspecified: Secondary | ICD-10-CM | POA: Insufficient documentation

## 2022-08-31 NOTE — Progress Notes (Addendum)
Primary Care Physician: Hoyt Koch, MD Referring Physician: [facility] hospital f/u  [facility]- Dr. Haroldine Laws  EP: Dr. [name] is a 52 y.o. 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 She was placed on amiodarone and cardioverted. She was in SR on discharge and again on  12/5. On presentation ekg, she was in Afib, rate controlled. She was asymptomatic. This appointment was made at time of d/c to be considered for an ablation to prevent long term amio use. She does not drink alcohol, consume large amts of caffeine or smoke. She finished a home sleep study but has not heard the results. She continues on amiodarone 200 mg bid. She is on eliquis 5 mg bid for a CHA2DS2VASc score of 3.   She went on to have an ablation 08/03/22 after achieving some weight goals. She is now in the afib clinic for one month f/u . She has been staying in [place]. She developed symptoms of a cold last week, treating with OTC meds. No swallowing or groin issues. Back to usual activities. Compliant with anticoagulation.   Today, she denies symptoms of palpitations, chest pain, shortness of breath, orthopnea, PND, lower extremity edema, dizziness, presyncope, syncope, or neurologic sequela. The patient is tolerating medications without difficulties and is otherwise without complaint today.   Past Medical History:  Diagnosis Date   Abnormal Pap smear of cervix    Allergy    Anemia    CHF (congestive heart failure) (HCC)    Fibroid    Hypertension    Leukocytosis, unspecified 10/18/2013   Obesity    Plantar fasciitis    Past Surgical History:  Procedure Laterality Date   ATRIAL FIBRILLATION ABLATION N/A 08/03/2022   Procedure: ATRIAL FIBRILLATION ABLATION;  Surgeon: Vickie Epley, MD;  Location: [place] [facility];  Service: Cardiovascular;  Laterality: N/A;   CARDIOVERSION N/A 05/12/2021   Procedure: CARDIOVERSION;  Surgeon:  Jolaine Artist, MD;  Location: [facility] ENDOSCOPY;  Service: Cardiovascular;  Laterality: N/A;   CARDIOVERSION N/A 05/29/2021   Procedure: CARDIOVERSION;  Surgeon: Jolaine Artist, MD;  Location: [place];  Service: Cardiovascular;  Laterality: N/A;   COLPOSCOPY     RIGHT/LEFT HEART CATH AND CORONARY ANGIOGRAPHY N/A 05/11/2021   Procedure: RIGHT/LEFT HEART CATH AND CORONARY ANGIOGRAPHY;  Surgeon: Jolaine Artist, MD;  Location: [place] [facility];  Service: Cardiovascular;  Laterality: N/A;   TEE WITHOUT CARDIOVERSION N/A 05/12/2021   Procedure: TRANSESOPHAGEAL ECHOCARDIOGRAM (TEE);  Surgeon: Jolaine Artist, MD;  Location: [facility];  Service: Cardiovascular;  Laterality: N/A;   TUBAL LIGATION  2001    Current Outpatient Medications  Medication Sig Dispense Refill   acetaminophen (TYLENOL) 500 MG tablet Take 1,000 mg by mouth every 6 (six) hours as needed for moderate pain or headache.     amiodarone (PACERONE) 200 MG tablet Take 1 tablet (200 mg total) by mouth daily. 90 tablet 1   azelastine (ASTELIN) 0.1 % nasal spray Place 1 spray into both nostrils 2 (two) times daily. Use in each nostril as directed (Patient taking differently: Place 1 spray into both nostrils daily as needed for rhinitis. Use in each nostril as directed) 30 mL 12   benzonatate (TESSALON) 100 MG capsule Take 1 capsule (100 mg total) by mouth 3 (three) times daily as needed for cough. 20 capsule 0   carvedilol (COREG) 6.25 MG tablet TAKE 1 TABLET BY MOUTH TWICE  A DAY 180 tablet 3   Cholecalciferol (VITAMIN D3) 25 MCG (1000 UT) CAPS Take 2,000 Units by mouth daily in the afternoon.     dapagliflozin propanediol (FARXIGA) 10 MG TABS tablet Take 1 tablet (10 mg total) by mouth daily. 30 tablet 6   ELIQUIS 5 MG TABS tablet TAKE 1 TABLET BY MOUTH TWICE DAILY 60 tablet 6   fluticasone (FLONASE) 50 MCG/ACT nasal spray Place 2 sprays into both nostrils as needed for allergies or rhinitis.     guaiFENesin  (MUCINEX) 600 MG 12 hr tablet Take 600 mg by mouth 2 (two) times daily as needed for cough or to loosen phlegm.     hydrALAZINE (APRESOLINE) 50 MG tablet Take 1 tablet (50 mg total) by mouth 3 (three) times daily. 90 tablet 6   hydrocortisone (ANUSOL-HC) 25 MG suppository Place 1 suppository (25 mg total) rectally 2 (two) times daily. (Patient taking differently: Place 25 mg rectally 2 (two) times daily as needed for hemorrhoids or anal itching.) 12 suppository 1   hydrocortisone 2.5 % cream Apply topically 2 (two) times daily. (Patient taking differently: Apply 1 Application topically daily as needed (hemorrhoids).) 30 g 1   isosorbide mononitrate (IMDUR) 30 MG 24 hr tablet TAKE 1 TABLET BY MOUTH EVERY DAY 90 tablet 1   losartan (COZAAR) 50 MG tablet Take 1 tablet (50 mg total) by mouth in the morning and at bedtime. 90 tablet 3   pantoprazole (PROTONIX) 40 MG tablet Take 1 tablet (40 mg total) by mouth daily. 45 tablet 0   potassium chloride SA (KLOR-CON M) 20 MEQ tablet Take 1 tablet (20 mEq total) by mouth as needed (Take when taking Torsemide). 30 tablet 3   Probiotic CHEW Chew 2 capsules by mouth daily.     spironolactone (ALDACTONE) 25 MG tablet TAKE 1 TABLET (25 MG TOTAL) BY MOUTH DAILY. 90 tablet 3   torsemide (DEMADEX) 20 MG tablet Take 1 tablet (20 mg total) by mouth as needed (when weight is up or notes swelling). 30 tablet 3   triamcinolone cream (KENALOG) 0.1 % Apply 1 Application topically 2 (two) times daily. (Patient taking differently: Apply 1 Application topically daily as needed (Rash).) 30 g 0   colchicine 0.6 MG tablet Take 1/2 tablet (0.3 mg total) by mouth 2 (two) times daily for 5 days. 5 tablet 0   norethindrone-ethinyl estradiol (MICROGESTIN) 1-20 MG-MCG tablet Take 1 tablet by mouth daily. (Patient not taking: Reported on 08/31/2022) 28 tablet 11   No current facility-administered medications for this encounter.    Allergies  Allergen Reactions    Lisinopril-Hydrochlorothiazide Anaphylaxis    Angioedema   Other Anaphylaxis    Social History   Socioeconomic History   Marital status: Divorced    Spouse name: Not on file   Number of children: 4   Years of education: Not on file   Highest education level: Not on file  Occupational History   Occupation: CNA    Employer: Information systems manager   Tobacco Use   Smoking status: Never   Smokeless tobacco: Never  Vaping Use   Vaping Use: Never used  Substance and Sexual Activity   Alcohol use: No   Drug use: No   Sexual activity: Yes    Partners: Male    Birth control/protection: Surgical    Comment: BTL  Other Topics Concern   Not on file  Social History Narrative   Haruna has 4 children. Three live at home with her.   Social Determinants of  Health   Financial Resource Strain: Not on file  Food Insecurity: No Food Insecurity (05/31/2021)   Hunger Vital Sign    Worried About Running Out of Food in the Last Year: Never true    Ran Out of Food in the Last Year: Never true  Transportation Needs: No Transportation Needs (05/31/2021)   PRAPARE - Hydrologist (Medical): No    Lack of Transportation (Non-Medical): No  Physical Activity: Not on file  Stress: Not on file  Social Connections: Not on file  Intimate Partner Violence: Not on file    Family History  Problem Relation Age of Onset   Hypertension Mother    Cervical cancer Mother    Hypertension Father    Stroke Father    Uterine cancer Maternal Grandmother    Diabetes Sister    Hypertension Brother    Hypertension Brother     ROS- All systems are reviewed and negative except as per the HPI above  Physical Exam: Vitals:   08/31/22 1101  Weight: 103.7 kg  Height: 5' 3"$  (1.6 m)   Wt Readings from Last 3 Encounters:  08/31/22 103.7 kg  08/27/22 104.9 kg  08/23/22 105.2 kg    Labs: Lab Results  Component Value Date   NA 138 07/23/2022   K 4.3 07/23/2022   CL 105 07/23/2022   CO2  22 07/23/2022   GLUCOSE 87 07/23/2022   BUN 27 (H) 07/23/2022   CREATININE 1.47 (H) 07/23/2022   CALCIUM 10.2 07/23/2022   PHOS 2.6 11/24/2021   MG 2.2 11/24/2021   Lab Results  Component Value Date   INR 1.2 05/05/2021   Lab Results  Component Value Date   CHOL 193 05/25/2021   HDL 47.60 05/25/2021   LDLCALC 125 (H) 05/25/2021   TRIG 102.0 05/25/2021     GEN- The patient is well appearing, alert and oriented x 3 today.   Head- normocephalic, atraumatic Eyes-  Sclera clear, conjunctiva pink Ears- hearing intact Oropharynx- clear Neck- supple, no JVP Lymph- no cervical lymphadenopathy Lungs- Clear to ausculation bilaterally, normal work of breathing Heart- Regular rate and rhythm, no murmurs, rubs or gallops, PMI not laterally displaced GI- soft, NT, ND, + BS Extremities- no clubbing, cyanosis, or edema MS- no significant deformity or atrophy Skin- no rash or lesion Psych- euthymic mood, full affect Neuro- strength and sensation are intact  EKG-Vent. rate 55 BPM PR interval 134 ms QRS duration 88 ms QT/QTcB 414/396 ms P-R-T axes 64 38 34 Sinus bradycardia Otherwise normal ECG When compared with ECG of 03-Aug-2022 10:47, PREVIOUS ECG IS PRESENT  Echo- 10/24/221. With Definity contrast, the very large papilarry muscles are  visualized. The apex is akinetic. There is possible early thrombus  formmation in the apex. . Left ventricular ejection fraction, by  estimation, is <20%. The left ventricle has severely  decreased function. The left ventricle demonstrates global hypokinesis.  The left ventricular internal cavity size was moderately dilated. There is  moderate concentric left ventricular hypertrophy. Left ventricular  diastolic function could not be  evaluated.   2. Right ventricular systolic function is moderately reduced. The right  ventricular size is moderately enlarged.   3. Left atrial size was mildly dilated.   4. Right atrial size was mildly dilated.    5. The mitral valve is grossly normal. Severe mitral valve regurgitation.   6. Tricuspid valve regurgitation is mild to moderate.   7. The aortic valve is grossly normal. Aortic valve regurgitation  is  mild. No aortic stenosis is present.   Echo- 2024-  1. Left ventricular ejection fraction, by estimation, is 55 to 60%. The  left ventricle has normal function. The left ventricle has no regional  wall motion abnormalities. There is mild left ventricular hypertrophy.  Left ventricular diastolic parameters  are indeterminate.   2. Right ventricular systolic function is normal. The right ventricular  size is normal. Tricuspid regurgitation signal is inadequate for assessing  PA pressure.   3. No evidence of mitral valve regurgitation.   4. The aortic valve is grossly normal. Aortic valve regurgitation is not  visualized.   5. The inferior vena cava is normal in size with greater than 50%  respiratory variability, suggesting right atrial pressure of 3 mmHg.   Comparison(s): No significant change from prior study.   Assessment and Plan:  1. New onset persistent  afib associated with CHF/TCM  Pt was loaded on amio and cardioversion during hospitalization in early November  2022 Had afib ablation 08/03/22 and remains in SR Continue amiodarone  200 mg  tablet daily  2. BMI of 40.49 Continued Weight loss/ exercise encouraged, BMI was 50 fall  of 2022  3. CHA2DS2VASc  score of 3 Continue eliquis 5 mg bid without interruption    4. CHF Appears normovolemic today     F/u with Dr. Quentin Ore  11/12/22  Geroge Baseman. Christine Oneal, [facility] [address] [place], [place] [address] [phone]

## 2022-08-31 NOTE — Progress Notes (Signed)
Left knee x-ray shows arthritis.

## 2022-09-06 ENCOUNTER — Ambulatory Visit (INDEPENDENT_AMBULATORY_CARE_PROVIDER_SITE_OTHER): Payer: Commercial Managed Care - HMO | Admitting: Internal Medicine

## 2022-09-06 ENCOUNTER — Encounter: Payer: Self-pay | Admitting: Internal Medicine

## 2022-09-06 VITALS — BP 120/80 | HR 65 | Temp 98.1°F | Ht 63.0 in | Wt 229.0 lb

## 2022-09-06 DIAGNOSIS — E210 Primary hyperparathyroidism: Secondary | ICD-10-CM

## 2022-09-06 DIAGNOSIS — Z23 Encounter for immunization: Secondary | ICD-10-CM

## 2022-09-06 DIAGNOSIS — I4819 Other persistent atrial fibrillation: Secondary | ICD-10-CM | POA: Diagnosis not present

## 2022-09-06 DIAGNOSIS — I10 Essential (primary) hypertension: Secondary | ICD-10-CM | POA: Diagnosis not present

## 2022-09-06 LAB — LIPID PANEL
Cholesterol: 162 mg/dL (ref 0–200)
HDL: 48.3 mg/dL (ref >39.00)
LDL Cholesterol: 101 mg/dL — ABNORMAL HIGH (ref 0–99)
NonHDL: 113.59
Total CHOL/HDL Ratio: 3
Triglycerides: 62 mg/dL (ref 0.0–149.0)
VLDL: 12.4 mg/dL (ref 0.0–40.0)

## 2022-09-06 LAB — COMPREHENSIVE METABOLIC PANEL
ALT: 31 U/L (ref 0–35)
AST: 18 U/L (ref 0–37)
Albumin: 3.7 g/dL (ref 3.5–5.2)
Alkaline Phosphatase: 61 U/L (ref 39–117)
BUN: 25 mg/dL — ABNORMAL HIGH (ref 6–23)
CO2: 26 mEq/L (ref 19–32)
Calcium: 11.3 mg/dL — ABNORMAL HIGH (ref 8.4–10.5)
Chloride: 105 mEq/L (ref 96–112)
Creatinine, Ser: 1.42 mg/dL — ABNORMAL HIGH (ref 0.40–1.20)
GFR: 42.34 mL/min — ABNORMAL LOW (ref >60.00)
Glucose, Bld: 88 mg/dL (ref 70–99)
Potassium: 3.9 mEq/L (ref 3.5–5.1)
Sodium: 138 mEq/L (ref 135–145)
Total Bilirubin: 0.9 mg/dL (ref 0.2–1.2)
Total Protein: 7.2 g/dL (ref 6.0–8.3)

## 2022-09-06 LAB — CBC
HCT: 34.1 % — ABNORMAL LOW (ref 36.0–46.0)
Hemoglobin: 11.7 g/dL — ABNORMAL LOW (ref 12.0–15.0)
MCHC: 34.3 g/dL (ref 30.0–36.0)
MCV: 101.4 fl — ABNORMAL HIGH (ref 78.0–100.0)
Platelets: 346 10*3/uL (ref 150.0–400.0)
RBC: 3.37 Mil/uL — ABNORMAL LOW (ref 3.87–5.11)
RDW: 13.8 % (ref 11.5–15.5)
WBC: 7.5 10*3/uL (ref 4.0–10.5)

## 2022-09-06 LAB — HEMOGLOBIN A1C: Hgb A1c MFr Bld: 5.3 % (ref 4.6–6.5)

## 2022-09-06 MED ORDER — HYDROCORTISONE ACETATE 25 MG RE SUPP: 25.0000 mg | Freq: Two times a day (BID) | RECTAL | 11 refills | Status: DC

## 2022-09-06 NOTE — Assessment & Plan Note (Signed)
BMI stable and counseled on diet and exercise to help reduce.

## 2022-09-06 NOTE — Assessment & Plan Note (Signed)
Is on eliquis for stroke prevention and coreg 6.25 mg BID for rate control. Sounds regular today but no EKG done.

## 2022-09-06 NOTE — Progress Notes (Signed)
   Subjective:   Patient ID: Christine Oneal, female    DOB: 1971/04/23, 52 y.o.   MRN: [id number]  HPI The patient is here for follow up.  PMH, [facility], social history reviewed and updated  Review of Systems  Constitutional: Negative.   HENT: Negative.    Eyes: Negative.   Respiratory:  Negative for cough, chest tightness and shortness of breath.   Cardiovascular:  Negative for chest pain, palpitations and leg swelling.  Gastrointestinal:  Negative for abdominal distention, abdominal pain, constipation, diarrhea, nausea and vomiting.  Musculoskeletal:  Positive for arthralgias.  Skin: Negative.   Neurological: Negative.   Psychiatric/Behavioral: Negative.      Objective:  Physical Exam Constitutional:      Appearance: She is well-developed. She is obese.  HENT:     Head: Normocephalic and atraumatic.  Cardiovascular:     Rate and Rhythm: Normal rate and regular rhythm.  Pulmonary:     Effort: Pulmonary effort is normal. No respiratory distress.     Breath sounds: Normal breath sounds. No wheezing or rales.  Abdominal:     General: Bowel sounds are normal. There is no distension.     Palpations: Abdomen is soft.     Tenderness: There is no abdominal tenderness. There is no rebound.  Musculoskeletal:        General: Tenderness present.     Cervical back: Normal range of motion.  Skin:    General: Skin is warm and dry.  Neurological:     Mental Status: She is alert and oriented to person, place, and time.     Coordination: Coordination normal.     Vitals:   09/06/22 1030  BP: 120/80  Pulse: 65  Temp: 98.1 F (36.7 C)  TempSrc: Oral  SpO2: 99%  Weight: 229 lb (103.9 kg)  Height: 5' 3"$  (1.6 m)    Assessment & Plan:  Shingrix IM given at visit

## 2022-09-06 NOTE — Assessment & Plan Note (Signed)
Checking CMP.  

## 2022-09-06 NOTE — Assessment & Plan Note (Signed)
Checking CBC, CMP, lipid panel and adjust as needed. Keep regimen the same with spironolactone 25 mg daily and losartan 50 mg BID and imdur 30 mg daily and hydralazine 50 mg TID and coreg 6.25 mg BID and amlodipine 10 mg daily

## 2022-09-10 ENCOUNTER — Telehealth: Payer: Self-pay | Admitting: Internal Medicine

## 2022-09-10 NOTE — Telephone Encounter (Signed)
Pt called asking about a fax need to be sent from [place] back to [facility] so she can receive ger products.  Best call back # [phone]

## 2022-09-11 ENCOUNTER — Other Ambulatory Visit (HOSPITAL_COMMUNITY): Payer: Self-pay | Admitting: Physician Assistant

## 2022-09-13 NOTE — Telephone Encounter (Signed)
Called patient and informed her that we have sent this form back 2x.

## 2022-09-14 ENCOUNTER — Telehealth: Payer: Self-pay | Admitting: Cardiology

## 2022-09-14 NOTE — Telephone Encounter (Signed)
*  STAT* If patient is at the pharmacy, call can be transferred to refill team.   1. Which medications need to be refilled? (please list name of each medication and dose if known) dapagliflozin propanediol (FARXIGA) 10 MG TABS tablet   2. Which pharmacy/location (including street and city if local pharmacy) is medication to be sent to? [facility] [place], [place]   3. Do they need a 30 day or 90 day supply? 30 day

## 2022-09-15 ENCOUNTER — Telehealth: Payer: Self-pay

## 2022-09-15 ENCOUNTER — Other Ambulatory Visit (HOSPITAL_COMMUNITY): Payer: Self-pay

## 2022-09-15 ENCOUNTER — Other Ambulatory Visit (HOSPITAL_COMMUNITY): Payer: Self-pay | Admitting: Cardiology

## 2022-09-15 ENCOUNTER — Telehealth: Payer: Self-pay | Admitting: Cardiology

## 2022-09-15 MED ORDER — HYDRALAZINE HCL 50 MG PO TABS: 50.0000 mg | ORAL_TABLET | Freq: Three times a day (TID) | ORAL | 6 refills | Status: DC

## 2022-09-15 NOTE — Telephone Encounter (Signed)
*  STAT* If patient is at the pharmacy, call can be transferred to refill team.   1. Which medications need to be refilled? (please list name of each medication and dose if known) dapagliflozin propanediol (FARXIGA) 10 MG TABS tablet   2. Which pharmacy/location (including street and city if local pharmacy) is medication to be sent to?  [facility] [place], [place]    3. Do they need a 30 day or 90 day supply? [place]

## 2022-09-15 NOTE — Telephone Encounter (Signed)
Spoke with pharmacy staff who states she does have refills however medication is requiring prior authorization. Thank you!

## 2022-09-15 NOTE — Telephone Encounter (Signed)
Pharmacy Patient Advocate Encounter  Prior Authorization for Wilder Glade '10mg'$  has been approved by [employer] (ins).    key# [id number] Effective dates: 3.6.24 through 3.6.25

## 2022-09-15 NOTE — Telephone Encounter (Signed)
Patient should have refills on this medication at her pharmacy.  dapagliflozin propanediol (FARXIGA) 10 MG TABS tablet 30 tablet 6 06/21/2022    Sig - Route: Take 1 tablet (10 mg total) by mouth daily. - Oral   Sent to pharmacy as: dapagliflozin propanediol (FARXIGA) 10 MG Tab tablet   E-Prescribing Status: Receipt confirmed by pharmacy (06/21/2022  6:10 PM EST)    Pharmacy  [facility] [place], [place]

## 2022-09-15 NOTE — Telephone Encounter (Signed)
Pharmacy Patient Advocate Encounter    PA submitted on 3.6.24 to (ins) [place] plan via CoverMyMeds  Key confirmation # [id number]   Status is pending

## 2022-09-24 DIAGNOSIS — M25562 Pain in left knee: Secondary | ICD-10-CM | POA: Diagnosis not present

## 2022-09-24 DIAGNOSIS — M25561 Pain in right knee: Secondary | ICD-10-CM | POA: Diagnosis not present

## 2022-09-27 ENCOUNTER — Other Ambulatory Visit: Payer: Self-pay

## 2022-09-27 NOTE — Telephone Encounter (Signed)
This is a A-Fib clinic pt 

## 2022-09-28 ENCOUNTER — Telehealth (INDEPENDENT_AMBULATORY_CARE_PROVIDER_SITE_OTHER): Payer: Medicaid Other | Admitting: Internal Medicine

## 2022-09-28 ENCOUNTER — Encounter: Payer: Self-pay | Admitting: Internal Medicine

## 2022-09-28 DIAGNOSIS — K649 Unspecified hemorrhoids: Secondary | ICD-10-CM

## 2022-09-28 DIAGNOSIS — N3946 Mixed incontinence: Secondary | ICD-10-CM

## 2022-09-28 MED ORDER — HYDROCORTISONE (PERIANAL) 2.5 % EX CREA: 1.0000 | TOPICAL_CREAM | Freq: Two times a day (BID) | CUTANEOUS | 11 refills | Status: DC

## 2022-09-28 MED ORDER — NYSTATIN 100000 UNIT/GM EX POWD: 1.0000 | Freq: Three times a day (TID) | CUTANEOUS | 11 refills | Status: DC

## 2022-09-28 NOTE — Progress Notes (Signed)
Virtual Visit via Video Note  I connected with Christine Oneal on 09/28/22 at 10:20 AM EDT by a video enabled telemedicine application and verified that I am speaking with the correct person using two identifiers.  The patient and the provider were at separate locations throughout the entire encounter. Patient location: home, Provider location: work   I discussed the limitations of evaluation and management by telemedicine and the availability of in person appointments. The patient expressed understanding and agreed to proceed. The patient and the provider were the only parties present for the visit unless noted in HPI below.  History of Present Illness: The patient is a 52 y.o. female with visit for hemorrhoids and skin changes. Also urinary supplies form still not received by [employer].   Observations/Objective: Appearance: normal, breathing appears normal, casual grooming, abdomen does not appear distended, memory normal, mental status is A and O times 3  Assessment and Plan: See problem oriented charting  Follow Up Instructions: re-fax supplies form for urinary incontinence supplies. Rx anusol cream and nystatin powder  I discussed the assessment and treatment plan with the patient. The patient was provided an opportunity to ask questions and all were answered. The patient agreed with the plan and demonstrated an understanding of the instructions.   The patient was advised to call back or seek an in-person evaluation if the symptoms worsen or if the condition fails to improve as anticipated.  Hoyt Koch, MD

## 2022-09-28 NOTE — Assessment & Plan Note (Signed)
Due to weight loss she does have skin folds and they have some fungal changes. Rx nystatin powder to use in those areas which are red and irritated.

## 2022-09-28 NOTE — Assessment & Plan Note (Signed)
Referral to GI for banding. Rx anusol cream to use in the meantime for flare of hemorrhoids.

## 2022-09-28 NOTE — Assessment & Plan Note (Signed)
Forms have been completed and need to be re-faxed.

## 2022-10-04 ENCOUNTER — Other Ambulatory Visit (HOSPITAL_COMMUNITY): Payer: Self-pay

## 2022-10-04 DIAGNOSIS — I4819 Other persistent atrial fibrillation: Secondary | ICD-10-CM

## 2022-10-04 MED ORDER — AMIODARONE HCL 200 MG PO TABS: 200.0000 mg | ORAL_TABLET | Freq: Every day | ORAL | 1 refills | Status: DC

## 2022-10-05 ENCOUNTER — Telehealth: Payer: Self-pay

## 2022-10-05 DIAGNOSIS — G8929 Other chronic pain: Secondary | ICD-10-CM

## 2022-10-05 NOTE — Telephone Encounter (Signed)
Received a fax from [facility] about patient moving to [place] and wanting to see a physical therapist in [place]. I have placed a new referral to [facility] on [address].

## 2022-10-12 ENCOUNTER — Ambulatory Visit: Payer: Medicaid Other | Admitting: Internal Medicine

## 2022-10-12 ENCOUNTER — Encounter: Payer: Self-pay | Admitting: Internal Medicine

## 2022-10-15 ENCOUNTER — Telehealth: Payer: Self-pay | Admitting: Internal Medicine

## 2022-10-15 ENCOUNTER — Encounter: Payer: Self-pay | Admitting: Internal Medicine

## 2022-10-15 ENCOUNTER — Ambulatory Visit: Payer: Medicaid Other | Admitting: Internal Medicine

## 2022-10-15 NOTE — Patient Instructions (Addendum)
Call [phone] and push 1 to schedule with GI for the hemorrhoid.

## 2022-10-15 NOTE — Progress Notes (Signed)
   Subjective:   Patient ID: Christine Oneal, female    DOB: 08-09-1970, 52 y.o.   MRN: [id number]  HPI The patient is a 52 YO coming in for follow up.  Review of Systems  Constitutional: Negative.   HENT: Negative.    Eyes: Negative.   Respiratory:  Negative for cough, chest tightness and shortness of breath.   Cardiovascular:  Negative for chest pain, palpitations and leg swelling.  Gastrointestinal:  Negative for abdominal distention, abdominal pain, constipation, diarrhea, nausea and vomiting.  Musculoskeletal: Negative.   Skin: Negative.   Neurological: Negative.   Psychiatric/Behavioral: Negative.      Objective:  Physical Exam Constitutional:      Appearance: She is well-developed.  HENT:     Head: Normocephalic and atraumatic.  Cardiovascular:     Rate and Rhythm: Normal rate and regular rhythm.  Pulmonary:     Effort: Pulmonary effort is normal. No respiratory distress.     Breath sounds: Normal breath sounds. No wheezing or rales.  Abdominal:     General: Bowel sounds are normal. There is no distension.     Palpations: Abdomen is soft.     Tenderness: There is no abdominal tenderness. There is no rebound.     Comments: Rash healed lower abdomen under skin fold with darkening no skin breakdown  Musculoskeletal:     Cervical back: Normal range of motion.  Skin:    General: Skin is warm and dry.  Neurological:     Mental Status: She is alert and oriented to person, place, and time.     Coordination: Coordination normal.     Vitals:   10/15/22 0957  BP: 100/60  Pulse: 70  Temp: 97.9 F (36.6 C)  TempSrc: Oral  SpO2: 99%  Weight: 228 lb (103.4 kg)  Height: 5\' 3"  (1.6 m)    Assessment & Plan:  Visit time 20 minutes in face to face communication with patient and coordination of care, additional 5 minutes spent in record review, coordination or care, ordering tests, communicating/referring to other healthcare professionals, documenting in medical records  all on the same day of the visit for total time 25 minutes spent on the visit.

## 2022-10-15 NOTE — Assessment & Plan Note (Signed)
Weight still decreasing discussed slow and steady but down 40-50 pounds since last year. Gains with decreased sugar levels, cholesterol levels and stable BP. Encouraged continued success and progress.

## 2022-10-15 NOTE — Telephone Encounter (Signed)
Patient came in and dropped off the fax number that Dr. Okey Dupre needed for [employer]. Fax # is [phone].   Best callback number is [phone]

## 2022-10-15 NOTE — Telephone Encounter (Signed)
I have re faxed this for the patient with the new number provided

## 2022-10-19 DIAGNOSIS — I10 Essential (primary) hypertension: Secondary | ICD-10-CM | POA: Diagnosis not present

## 2022-10-19 DIAGNOSIS — R32 Unspecified urinary incontinence: Secondary | ICD-10-CM | POA: Diagnosis not present

## 2022-10-19 DIAGNOSIS — I5022 Chronic systolic (congestive) heart failure: Secondary | ICD-10-CM | POA: Diagnosis not present

## 2022-10-19 NOTE — Telephone Encounter (Signed)
Forms have been faxed again to: [phone] & [phone]  And emailed to  [email or web address]

## 2022-10-20 ENCOUNTER — Ambulatory Visit (HOSPITAL_COMMUNITY)
Admission: RE | Admit: 2022-10-20 | Discharge: 2022-10-20 | Disposition: A | Discharge status: Home / Self Care | Payer: Medicaid Other | Source: Ambulatory Visit | Attending: Family Medicine | Admitting: Family Medicine

## 2022-10-20 ENCOUNTER — Other Ambulatory Visit (HOSPITAL_COMMUNITY): Payer: Self-pay

## 2022-10-20 ENCOUNTER — Encounter (HOSPITAL_COMMUNITY): Payer: Self-pay

## 2022-10-20 VITALS — BP 110/62 | HR 65 | Wt 231.0 lb

## 2022-10-20 DIAGNOSIS — Z6841 Body Mass Index (BMI) 40.0 and over, adult: Secondary | ICD-10-CM | POA: Diagnosis not present

## 2022-10-20 DIAGNOSIS — I5040 Unspecified combined systolic (congestive) and diastolic (congestive) heart failure: Secondary | ICD-10-CM | POA: Diagnosis not present

## 2022-10-20 DIAGNOSIS — E210 Primary hyperparathyroidism: Secondary | ICD-10-CM | POA: Insufficient documentation

## 2022-10-20 DIAGNOSIS — Z7901 Long term (current) use of anticoagulants: Secondary | ICD-10-CM | POA: Diagnosis not present

## 2022-10-20 DIAGNOSIS — T783XXA Angioneurotic edema, initial encounter: Secondary | ICD-10-CM | POA: Diagnosis not present

## 2022-10-20 DIAGNOSIS — R000 Tachycardia, unspecified: Secondary | ICD-10-CM | POA: Diagnosis not present

## 2022-10-20 DIAGNOSIS — Z79899 Other long term (current) drug therapy: Secondary | ICD-10-CM | POA: Diagnosis not present

## 2022-10-20 DIAGNOSIS — I4819 Other persistent atrial fibrillation: Secondary | ICD-10-CM | POA: Diagnosis not present

## 2022-10-20 DIAGNOSIS — R0683 Snoring: Secondary | ICD-10-CM | POA: Insufficient documentation

## 2022-10-20 DIAGNOSIS — I10 Essential (primary) hypertension: Secondary | ICD-10-CM

## 2022-10-20 DIAGNOSIS — E669 Obesity, unspecified: Secondary | ICD-10-CM | POA: Insufficient documentation

## 2022-10-20 DIAGNOSIS — I5022 Chronic systolic (congestive) heart failure: Secondary | ICD-10-CM | POA: Diagnosis not present

## 2022-10-20 DIAGNOSIS — I110 Hypertensive heart disease with heart failure: Secondary | ICD-10-CM | POA: Insufficient documentation

## 2022-10-20 MED ORDER — ISOSORBIDE MONONITRATE ER 30 MG PO TB24: 30.0000 mg | ORAL_TABLET | Freq: Every day | ORAL | 3 refills | Status: DC

## 2022-10-20 MED ORDER — LOSARTAN POTASSIUM 50 MG PO TABS: 50.0000 mg | ORAL_TABLET | Freq: Two times a day (BID) | ORAL | 3 refills | Status: DC

## 2022-10-20 NOTE — Patient Instructions (Signed)
Please follow up with Darnell Level [phone]  It was great to see you today! No medication changes are needed at this time.  Your physician wants you to follow-up in: 4 months with Dr Gala Romney. You will receive a reminder letter in the mail two months in advance. If you don't receive a letter, please call our office to schedule the follow-up appointment.   Do the following things EVERYDAY: Weigh yourself in the morning before breakfast. Write it down and keep it in a log. Take your medicines as prescribed Eat low salt foods--Limit salt (sodium) to 2000 mg per day.  Stay as active as you can everyday Limit all fluids for the day to less than 2 liters  At the [facility], you and your health needs are our priority. As part of our continuing mission to provide you with exceptional heart care, we have created designated Provider Care Teams. These Care Teams include your primary Cardiologist (physician) and Advanced Practice Providers (APPs- Physician Assistants and Nurse Practitioners) who all work together to provide you with the care you need, when you need it.   You may see any of the following providers on your designated Care Team at your next follow up: Dr Arvilla Meres Dr Marca Ancona Dr. Marcos Eke, NP Robbie Lis, [place] [facility] [place], [place] Brynda Peon, NP Karle Plumber, PharmD   Please be sure to bring in all your medications bottles to every appointment.    Thank you for choosing [facility]

## 2022-10-20 NOTE — Addendum Note (Signed)
Encounter addended by: Theresia Bough, CMA on: 10/20/2022 12:18 PM  Actions taken: Order list changed

## 2022-10-20 NOTE — Progress Notes (Signed)
Advanced Heart Failure Note   PCP: Myrlene Broker, MD HF Cardiologist: Dr. Gala Romney  HPI: Christine Oneal is a 52 y.o. 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   Admitted from 05/03/21- 05/14/21 w/ new atrial fibrillation and CHF. She was admitted under Cardiology service and AHF consulted. D/t concern for low-output HF she was started on IV milrinone. Echo with LVEF < 20%, moderate LVH, moderately reduced RV, severe MR. 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 R/LHC 10/31 with normal coronary arteries, RA 2, PA 31/8, Fick CI 2.5, PA sat 67%. Diuresed with IV lasix then transitioned to po torsemide, weight down total of 31 lb. D/C weight 287 lb.     Unsuccessful DCCV x 3 shocks 11/22. IV amio switched to po 200 mg bid. Anticoagulated with Eliquis.    Pt had post hospital f/u w/ her PCP on 11/14 and complained of constipation and palpitations. Labs showed severe hypercalcemia and AKI secondary to overdiuresis as well as multiple medications.  Ca 13.9. SCr 1.65 (baseline 0.9). Given IV hydration and IV pamidronate. Remained in AF with RVR on admit, underwernt DCCV on 05/29/21 with brief return to NSR then back to AF. Back in NSR on day of discharge.  Echo 09/09/21 EF 50-55%   Saw Dr. Lalla Brothers in 10/23, Echo 1/24 showed EF 55-60%, mild LVH, RV ok.  now s/p AF ablation 07/2022.   Today she returns for HF follow up. Overall feeling fine. She is not SOB walking on flat ground, fatigued walking up steps. She is doing outpatient PT to work on strength. Denies palpitations, abnormal bleeding, CP, dizziness, edema, or PND/Orthopnea. Appetite ok. No fever or chills. Weight at home 229 pounds. Taking all medications. 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 Takes torsemide 2x/month.   Cardiac Studies:  - Echo (1/24): EF 55-60%, RV ok  - Echo (10/22): EF < 20%, RV moderately down, severe  MR, mild to mod TR  - TEE in (11/22): EF 25-30%, normal RV, mild MR.    - RHC (10/22):   Ao = 156/89 (112) LV =135/7 RA = 2 RV = 27/6 PA = 31/8 (20) PCW = 7 Fick cardiac output/index = 5.6/2.5 PVR = 2.2 WU FA sat = 99% PA sat = 67%. 69% SVC sat = 72%   1. Normal coronary arteries 2. 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 Well-compensated hemodynamics after large-volume diuresis  ROS: All systems negative except as listed in HPI, PMH and Problem List.  SH:  Social History   Socioeconomic History   Marital status: Divorced    Spouse name: Not on file   Number of children: 4   Years of education: Not on file   Highest education level: Not on file  Occupational History   Occupation: CNA    Employer: [employer]   Tobacco Use   Smoking status: Never   Smokeless tobacco: Never  Vaping Use   Vaping Use: Never used  Substance and Sexual Activity   Alcohol use: No   Drug use: No   Sexual activity: Yes    Partners: Male    Birth control/protection: Surgical    Comment: BTL  Other Topics Concern   Not on file  Social History Narrative   Christine Oneal has 4 children. Three live at home with her.   Social Determinants of Health   Financial Resource Strain:  Not on file  Food Insecurity: No Food Insecurity (05/31/2021)   Hunger Vital Sign    Worried About Running Out of Food in the Last Year: Never true    Ran Out of Food in the Last Year: Never true  Transportation Needs: No Transportation Needs (05/31/2021)   PRAPARE - Administrator, Civil Service (Medical): No    Lack of Transportation (Non-Medical): No  Physical Activity: Not on file  Stress: Not on file  Social Connections: Not on file  Intimate Partner Violence: Not on file   FH:  Family History  Problem Relation Age of Onset   Hypertension Mother    Cervical cancer Mother    Hypertension Father    Stroke Father    Uterine cancer Maternal Grandmother    Diabetes Sister     Hypertension Brother    Hypertension Brother    Past Medical History:  Diagnosis Date   Abnormal Pap smear of cervix    Allergy    Anemia    CHF (congestive heart failure)    Fibroid    Hypertension    Leukocytosis, unspecified 10/18/2013   Obesity    Plantar fasciitis    Current Outpatient Medications  Medication Sig Dispense Refill   acetaminophen (TYLENOL) 500 MG tablet Take 1,000 mg by mouth every 6 (six) hours as needed for moderate pain or headache.     amiodarone (PACERONE) 200 MG tablet Take 1 tablet (200 mg total) by mouth daily. 90 tablet 1   azelastine (ASTELIN) 0.1 % nasal spray Place 1 spray into both nostrils 2 (two) times daily. Use in each nostril as directed (Patient taking differently: Place 1 spray into both nostrils daily as needed for rhinitis. Use in each nostril as directed) 30 mL 12   benzonatate (TESSALON) 100 MG capsule Take 1 capsule (100 mg total) by mouth 3 (three) times daily as needed for cough. 20 capsule 0   carvedilol (COREG) 6.25 MG tablet TAKE 1 TABLET BY MOUTH TWICE A DAY 180 tablet 3   Cholecalciferol (VITAMIN D3) 25 MCG (1000 UT) CAPS Take 2,000 Units by mouth daily in the afternoon.     Cholecalciferol (VITAMIN D3) 50 MCG (2000 UT) TABS Take 1 tablet by mouth daily.     dapagliflozin propanediol (FARXIGA) 10 MG TABS tablet Take 1 tablet (10 mg total) by mouth daily. 30 tablet 6   dextromethorphan-guaiFENesin (MUCINEX DM) 30-600 MG 12hr tablet Take 1 tablet by mouth as needed (cold).     diphenhydrAMINE (BENADRYL) 25 MG tablet Take 25 mg by mouth at bedtime as needed.     ELIQUIS 5 MG TABS tablet TAKE 1 TABLET BY MOUTH TWICE DAILY 60 tablet 6   famotidine (PEPCID) 20 MG tablet Take 20 mg by mouth as needed.     fluticasone (FLONASE) 50 MCG/ACT nasal spray Place 2 sprays into both nostrils as needed for allergies or rhinitis.     guaiFENesin (MUCINEX) 600 MG 12 hr tablet Take 600 mg by mouth 2 (two) times daily as needed for cough or to loosen  phlegm.     hydrALAZINE (APRESOLINE) 50 MG tablet Take 1 tablet (50 mg total) by mouth 3 (three) times daily. 90 tablet 6   hydrocortisone (ANUSOL-HC) 2.5 % rectal cream Place 1 Application rectally 2 (two) times daily. 30 g 11   hydrocortisone 2.5 % cream Apply topically 2 (two) times daily. (Patient taking differently: Apply 1 Application topically daily as needed (hemorrhoids).) 30 g 1   isosorbide  mononitrate (IMDUR) 30 MG 24 hr tablet TAKE 1 TABLET BY MOUTH EVERY DAY 90 tablet 1   KLOR-CON M20 20 MEQ tablet TAKE 1 TABLET BY MOUTH AS NEEDED( TAKE WHEN TAKING TORSEMIDE) 30 tablet 3   losartan (COZAAR) 50 MG tablet Take 1 tablet (50 mg total) by mouth in the morning and at bedtime. 90 tablet 3   norethindrone-ethinyl estradiol (MICROGESTIN) 1-20 MG-MCG tablet Take 1 tablet by mouth daily. 28 tablet 11   nystatin (MYCOSTATIN/NYSTOP) powder Apply 1 Application topically 3 (three) times daily. 60 g 11   Probiotic CHEW Chew 2 capsules by mouth daily.     spironolactone (ALDACTONE) 25 MG tablet TAKE 1 TABLET (25 MG TOTAL) BY MOUTH DAILY. 90 tablet 3   torsemide (DEMADEX) 20 MG tablet TAKE 1 TABLET BY MOUTH AS NEEDED(WHEN WEIGHT IS UP OR NOTES SWELLING) 30 tablet 3   triamcinolone cream (KENALOG) 0.1 % Apply 1 Application topically 2 (two) times daily. (Patient taking differently: Apply 1 Application topically daily as needed (Rash).) 30 g 0   No current facility-administered medications for this encounter.   BP 110/62   Pulse 65   Wt 104.8 kg (231 lb)   SpO2 100%   BMI 40.92 kg/m   Wt Readings from Last 3 Encounters:  10/20/22 104.8 kg (231 lb)  10/15/22 103.4 kg (228 lb)  09/06/22 103.9 kg (229 lb)   PHYSICAL EXAM: General:  NAD. No resp difficulty, walked into clinic HEENT: Normal Neck: Supple. No JVD. Carotids 2+ bilat; no bruits. No lymphadenopathy or thryomegaly appreciated. Cor: PMI nondisplaced. Regular rate & rhythm. No rubs, gallops or murmurs. Lungs: Clear Abdomen: Soft,  obese, nontender, nondistended. No hepatosplenomegaly. No bruits or masses. Good bowel sounds. Extremities: No cyanosis, clubbing, rash, edema Neuro: Alert & oriented x 3, cranial nerves grossly intact. Moves all 4 extremities w/o difficulty. Affect pleasant.  ASSESSMENT & PLAN:  1. Chronic systolic CHF with recovered EF:  - Nonischemic CMP,  tachy-mediated.   - Echo (10/22): EF < 20%, RV moderately down, severe MR, mild to mod TR - R/LHC (05/11/21): normal cors, RA 2, PA 31/8, Fick CI 2.5, PA sat 67% - TEE in (11/22): EF 25-30%, normal RV, mild MR.   - Echo 09/09/21 EF 50-55% -> EF has normalized  - Echo (1/24): EF 55-60%, mild LVH, RV ok. - NYHA II, Volume status ok  - Continue torsemide 20 mg daily PRN.  - Continue hydralazine 50 mg tid + Imdur 30 mg daily. - Continue Farxiga 10 mg daily. - Continue spiro 25 mg daily. - Continue losartan 50 mg bid (had angioedema with Entresto) - Continue carvedilol 6.25 mg bid. - Recent labs reviewed from 09/06/22 and are stable  2. Atrial fibrillation, persistent: - Failed DCCV 10/22.  - Underwent attempted DC-CV 05/29/21 Brief return to NSR then back to AF.  - Converted with amio 06/01/21 - Back in AF 12/23 - s/p AF ablation (1/24) - Regular on exam today. - CHA2DS2VASc score of 3. - Continue Eliquis.  - Continue amiodarone 200 mg daily for now - She has EP follow up next month.  3. Primary hyperparathyroidism - Ca up to 13.9 prompting admission 11/22.  - Received IV fluids and IV pamidronate. - Not on thiazide diuretic. - Followed by Dr. Lonzo CloudShamleffer, and referred for surgical evaluation.  - Following with Dr. Gerrit FriendsGerkin for parathyroid surgery (not scheduled yet).   4. HTN: - Controlled. - Continue current medications.  5. Snoring - HST looked ok   6. Obesity -  Body mass index is 40.92 kg/m. - Weight down 9 lbs, congratulated - Has been referrred to PharmD for semaglutide to help with weight loss (a1c 5.3 on 3/24). Insurance has not  approved  Follow up in 4 months with Dr. Gala Romney.  Jacklynn Ganong, FNP  11:22 AM

## 2022-10-22 ENCOUNTER — Ambulatory Visit (INDEPENDENT_AMBULATORY_CARE_PROVIDER_SITE_OTHER): Payer: Medicaid Other

## 2022-10-22 ENCOUNTER — Encounter: Payer: Self-pay | Admitting: Family Medicine

## 2022-10-22 ENCOUNTER — Ambulatory Visit (INDEPENDENT_AMBULATORY_CARE_PROVIDER_SITE_OTHER): Payer: Medicaid Other | Admitting: Family Medicine

## 2022-10-22 VITALS — BP 110/68 | HR 51 | Ht 63.0 in | Wt 227.8 lb

## 2022-10-22 DIAGNOSIS — M545 Low back pain, unspecified: Secondary | ICD-10-CM | POA: Diagnosis not present

## 2022-10-22 DIAGNOSIS — M5416 Radiculopathy, lumbar region: Secondary | ICD-10-CM

## 2022-10-22 MED ORDER — GABAPENTIN 100 MG PO CAPS: 100.0000 mg | ORAL_CAPSULE | Freq: Every day | ORAL | 2 refills | Status: AC

## 2022-10-22 MED ORDER — PREDNISONE 50 MG PO TABS: ORAL_TABLET | ORAL | 0 refills | Status: DC

## 2022-10-22 NOTE — Progress Notes (Signed)
   Christine Payor, PhD, LAT, ATC acting as a scribe for [name], MD.  Christine Oneal is a 52 y.o. 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 Pt was last seen by Dr. Denyse Amass on 08/27/22 and was advised to use Voltaren gel, Tylenol, and was referred to PT in [place] and later to the [facility]. She is scheduled for her 1st PT visit on 4/16. Today, pt reports worsening of leg sx with recent cold weather. Pain down the lateral aspect of the right leg Sx worse after prolonged sitting. Noticing more abnormalities with the gait.   Pain radiates from the right posterior hip and low back area down the lateral thigh and into the lateral calf.  Dx imaging: 08/27/22 R & L knee XR  Pertinent review of systems: No fevers or chills  Relevant historical information: Heart failure history   Exam:  BP 110/68   Pulse (!) 51   Ht 5\' 3"  (1.6 m)   Wt 227 lb 12.8 oz (103.3 kg)   SpO2 100%   BMI 40.35 kg/m  General: Well Developed, well nourished, and in no acute distress.   MSK: L-spine normal lumbar motion. Lower extremity strength is intact.  Right hip: Normal-appearing tender palpation minimally greater trochanter.    Lab and Radiology Results  X-ray images lumbar spine obtained today personally and independently interpreted DDD L5-S1.  Facet DJD present at this level.  No acute fractures are visible. Await formal radiology review     Assessment and Plan: 52 y.o. female with right leg pain concerning for lumbar radiculopathy at L5.  There may be component of trochanteric bursitis or even piriformis syndrome.  Physical therapy is scheduled to start at [facility] location next week which would be helpful.  For now we will use prednisone and gabapentin.  Recheck in 1 month.   PDMP not reviewed this encounter. Orders Placed This  Encounter  Procedures   DG Lumbar Spine 2-3 Views    Standing Status:   Future    Number of Occurrences:   1    Standing Expiration Date:   11/21/2022    Order Specific Question:   Reason for Exam (SYMPTOM  OR DIAGNOSIS REQUIRED)    Answer:   lumbar radiculopathy    Order Specific Question:   Preferred imaging location?    Answer:   [facility]    Order Specific Question:   Is patient pregnant?    Answer:   No   Meds ordered this encounter  Medications   gabapentin (NEURONTIN) 100 MG capsule    Sig: Take 1 capsule (100 mg total) by mouth at bedtime. 100-300mg  daily at bedtime    Dispense:  60 capsule    Refill:  2   predniSONE (DELTASONE) 50 MG tablet    Sig: Take 1 pill daily for 5 days    Dispense:  5 tablet    Refill:  0     Discussed warning signs or symptoms. Please see discharge instructions. Patient expresses understanding.   The above documentation has been reviewed and is accurate and complete [name], M.D.

## 2022-10-22 NOTE — Patient Instructions (Addendum)
Thank you for coming in today.   I've sent a prescription for Prednisone & Gabapentin to your pharmacy.   Please get an Xray today before you leave   Check back in 1 month

## 2022-10-25 NOTE — Therapy (Signed)
OUTPATIENT PHYSICAL THERAPY LOWER EXTREMITY EVALUATION   Patient Name: Christine Oneal MRN: [id number] DOB:1970/08/31, 52 y.o., female Today's Date: 10/26/2022  END OF SESSION:  PT End of Session - 10/26/22 1014     Visit Number 1    Number of Visits 17    Date for PT Re-Evaluation 12/21/22    Authorization Type [employer]    Authorization Time Period 27 visit annual limit    PT Start Time 1017    PT Stop Time 1102    PT Time Calculation (min) 45 min    Activity Tolerance Patient tolerated treatment well    Behavior During Therapy WFL for tasks assessed/performed             Past Medical History:  Diagnosis Date   Abnormal Pap smear of cervix    Allergy    Anemia    CHF (congestive heart failure)    Fibroid    Hypertension    Leukocytosis, unspecified 10/18/2013   Obesity    Plantar fasciitis    Past Surgical History:  Procedure Laterality Date   ATRIAL FIBRILLATION ABLATION N/A 08/03/2022   Procedure: ATRIAL FIBRILLATION ABLATION;  Surgeon: Lanier Prude, MD;  Location: [facility];  Service: Cardiovascular;  Laterality: N/A;   CARDIOVERSION N/A 05/12/2021   Procedure: CARDIOVERSION;  Surgeon: Dolores Patty, MD;  Location: [facility] ENDOSCOPY;  Service: Cardiovascular;  Laterality: N/A;   CARDIOVERSION N/A 05/29/2021   Procedure: CARDIOVERSION;  Surgeon: Dolores Patty, MD;  Location: [facility] ENDOSCOPY;  Service: Cardiovascular;  Laterality: N/A;   COLPOSCOPY     RIGHT/LEFT HEART CATH AND CORONARY ANGIOGRAPHY N/A 05/11/2021   Procedure: RIGHT/LEFT HEART CATH AND CORONARY ANGIOGRAPHY;  Surgeon: Dolores Patty, MD;  Location: [facility] INVASIVE CV LAB;  Service: Cardiovascular;  Laterality: N/A;   TEE WITHOUT CARDIOVERSION N/A 05/12/2021   Procedure: TRANSESOPHAGEAL ECHOCARDIOGRAM (TEE);  Surgeon: Dolores Patty, MD;  Location: [facility] ENDOSCOPY;  Service: Cardiovascular;  Laterality: N/A;   TUBAL LIGATION  2001   Patient Active Problem List   Diagnosis Date  Noted   Urinary incontinence 08/23/2022   Chronic pain of both knees 08/23/2022   Gait instability 08/23/2022   Dysmenorrhea 05/14/2022   Left foot pain 05/14/2022   Encounter for general adult medical examination with abnormal findings 04/17/2022   Primary hyperparathyroidism 11/25/2021   Chronic systolic CHF (congestive heart failure) 05/26/2021   Unspecified atrial fibrillation 05/04/2021   Anemia 09/04/2017   Morbid obesity 09/03/2017   Reactive airway disease 09/03/2017   Vitamin D deficiency 09/03/2017   Essential hypertension 10/24/2007   Hemorrhoids 10/24/2007    PCP: Myrlene Broker, MD  REFERRING PROVIDER: Rodolph Bong, MD  REFERRING DIAG: M25.561,M25.562,G89.29 (ICD-10-CM) - Chronic pain of both knees  THERAPY DIAG:  Chronic pain of both knees  Other abnormalities of gait and mobility  Muscle weakness (generalized)  Rationale for Evaluation and Treatment: Rehabilitation  ONSET DATE: chronic, worsening over past year  SUBJECTIVE:   SUBJECTIVE STATEMENT: Pt states she has had LE pain for "quite some time". Last year started to get worse, especially with navigating steps. Feels like R LE especially feels heavy going up steps, needs to use rails.  Also describes some groin pain in R leg. B knee pain, R>L. Tends to have pain with community navigation. Tends to settle with rest breaks. Pain is worse in the evening and at night, especially on R side. No N/T in limbs. Denies any buckling but does state that her  balance feels a little off when she is fatigued, particularly with turns and with stairs.  Red flag questioning reassuring.  PERTINENT HISTORY: HTN, CHF, afib (pt states well controlled since ablation in January although does have some exertional SOB with activity) PAIN:  Are you having pain: 2-3/10 Location/description: B knees R>L Best-worst over past week: 2-7/10  - aggravating factors: stair navigation, standing/walking >28min, sleeping R side  (wakes up 3x/night on average), sitting >52min - Easing factors: rest, changing positions, medication, voltaren    PRECAUTIONS: fall hx, cardiac hx   WEIGHT BEARING RESTRICTIONS: No  FALLS:  Has patient fallen in last 6 months? No, did have a fall in September 2023, tripped and fell. 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 2. Right kidney stone.  BIL knee XR February 2024 with degenerative changes, see EPIC for details  PATIENT SURVEYS:  LEFS eval - 43/80  COGNITION: Overall cognitive status: Within functional limits for tasks assessed     SENSATION: Light touch intact B LE    PALPATION: Concordant tenderness B lat quads and TFL, no pain with gentle patellar mobs in all directions, no joint line or tendon tenderness   LOWER EXTREMITY ROM:     Active  Right eval Left eval  Hip flexion    Hip extension    Hip internal rotation    Hip external rotation    Knee extension 0 0  Knee flexion 120 deg s 110 deg painless  (Blank rows = not tested) (Key: WFL = within functional limits not formally assessed, * = concordant pain, s = stiffness/stretching sensation, NT = not tested)  Comments:   LOWER EXTREMITY MMT:    MMT Right eval Left eval  Hip flexion 4 3+  Hip abduction (modified sitting) 4 4  Hip internal rotation    Hip external rotation    Knee flexion 4+ 4+  Knee extension 4 *  4+  Ankle  dorsiflexion     (Blank rows = not tested) (Key: WFL = within functional limits not formally assessed, * = concordant pain, s = stiffness/stretching sensation, NT = not tested)  Comments:    FUNCTIONAL TESTS:  5xSTS: 14.66 sec w UE support from standard chair no increase in pain  GAIT: Distance walked: within clinic Assistive device utilized: None Level of assistance: Complete Independence Comments: reduced gait speed/cadence, widened BOS, reduced trunk rotation/arm swing    TODAY'S TREATMENT:  [facility] Adult PT Treatment:                                                DATE: 10/26/22 Therapeutic Exercise: SLR x5 each LE LAQ x5 each LE Seated heel/toe raises x10 BIL  Education for [employer], handout provided   PATIENT EDUCATION:  Education details: Pt education on PT impairments, prognosis, and POC. Informed consent. Rationale for interventions, safe/appropriate HEP performance Person educated: Patient Education method: Explanation, Demonstration, Tactile cues, Verbal cues, and Handouts Education comprehension: verbalized understanding, returned demonstration, verbal cues required, tactile cues required, and needs further education    HOME EXERCISE PROGRAM: Access Code: [email or web address] URL: [email or web address] Date: 10/26/2022 Prepared by: Fransisco Hertz  Exercises - Seated Long Arc Quad  - 1 x daily - 7 x weekly - 2 sets - 10 reps - Seated Heel Toe Raises  - 1 x daily - 7 x weekly - 2 sets - 10 reps  ASSESSMENT:  CLINICAL IMPRESSION: Patient is a pleasant 52 y.o. woman who was seen today for physical therapy evaluation and treatment for B knee pain. Pt endorses ongoing symptoms for several years but have worsened over past year, increasing difficulties with ADLs/mobility. On exam pt demos mild knee flexion deficits on L although symptoms more  prominent on R, mild weakness throughout B LE. Concordant pain elicited with quad MMT. 5xSTS time at cutoff score for fall risk in most populations (12-14sec) and indicative of reduced LE strength and functional mobility. Pt tolerates session well, reports improved symptoms after HEP with education for appropriate performance. Recommend skilled PT to address relevant deficits to maximize functional tolerance. Pt departs today's session in no acute distress, all voiced questions/concerns addressed appropriately from PT perspective.      OBJECTIVE IMPAIRMENTS: decreased activity tolerance, decreased balance, decreased endurance, decreased mobility, difficulty walking, decreased ROM, decreased strength, and pain.   ACTIVITY LIMITATIONS: carrying, bending, sitting, standing, squatting, sleeping, stairs, transfers, and locomotion level  PARTICIPATION LIMITATIONS: meal prep, cleaning, laundry, community activity, and occupation  PERSONAL FACTORS: Time since onset of injury/illness/exacerbation and 3+ comorbidities: afib/CHF, HTN, MSK comorbidities (LBP)  are also affecting patient's functional outcome.   REHAB POTENTIAL: Good  CLINICAL DECISION MAKING: Stable/uncomplicated  EVALUATION COMPLEXITY: Low   GOALS: Goals reviewed with patient? No given time constraints  SHORT TERM GOALS: Target date: 11/23/2022   Pt will demonstrate appropriate understanding and performance of initially prescribed HEP in order to facilitate improved independence with management of symptoms.  Baseline: HEP provided on eval Goal status: INITIAL   2. Pt will score greater than or equal to 52 on LEFS in order to demonstrate improved perception of function due to symptoms.  Baseline: 43/80  Goal status: INITIAL    LONG TERM GOALS: Target date: 12/21/2022 Pt will score 60 or greater on LEFS in order to demonstrate improved perception of function due to symptoms.  Baseline: 43/80 Goal status: INITIAL  2.  Pt will  demonstrate at least 120 degrees of knee flexion ROM bilaterally in order to facilitate improved mechanics with gait/transfers. Baseline: see ROM chart above Goal status: INITIAL  3.  Pt will demonstrate appropriate performance of final prescribed HEP in order to facilitate improved self-management of symptoms post-discharge.   Baseline: initial HEP prescribed  Goal status: INITIAL    4.  Pt will be able to perform 5xSTS in less than  or equal to 12sec without UE support in order to demonstrate reduced fall risk and improved functional independence (MCID 5xSTS = 2.3 sec). Baseline: 14.66sec w UE support Goal status: INITIAL   5. Pt will report at least 50% decrease in overall pain levels in past week in order to facilitate improved tolerance to basic ADLs/mobility.   Baseline: 2-7/10  Goal status: INITIAL    6. Pt will report/demonstrate ability to stand/walk for up to 45 min with less than 3 pt increase in resting pain in order to promote improved community navigation and exercise tolerance for overall health/QOL.   Baseline: pain >48min of standing/walking  Goal status: INITIAL    PLAN:  PT FREQUENCY: 1-2x/week (plan 2x/week for 4 weeks, taper to 1x/week subsequently)  PT DURATION: 8 weeks  PLANNED INTERVENTIONS: Therapeutic exercises, Therapeutic activity, Neuromuscular re-education, Balance training, Gait training, Patient/Family education, Self Care, Joint mobilization, Aquatic Therapy, Dry Needling, Cryotherapy, Moist heat, Taping, Manual therapy, and Re-evaluation  PLAN FOR NEXT SESSION: Progress ROM/strengthening exercises as able/appropriate, review HEP. Emphasis on comfortable quad activation, hip strengthening.    Ashley Murrain PT, DPT 10/26/2022 5:58 PM

## 2022-10-26 ENCOUNTER — Other Ambulatory Visit: Payer: Self-pay

## 2022-10-26 ENCOUNTER — Ambulatory Visit: Payer: Medicaid Other | Attending: Family Medicine | Admitting: Physical Therapy

## 2022-10-26 ENCOUNTER — Encounter: Payer: Self-pay | Admitting: Physical Therapy

## 2022-10-26 DIAGNOSIS — R2689 Other abnormalities of gait and mobility: Secondary | ICD-10-CM | POA: Diagnosis present

## 2022-10-26 DIAGNOSIS — M25561 Pain in right knee: Secondary | ICD-10-CM | POA: Diagnosis not present

## 2022-10-26 DIAGNOSIS — M6281 Muscle weakness (generalized): Secondary | ICD-10-CM | POA: Diagnosis present

## 2022-10-26 DIAGNOSIS — G8929 Other chronic pain: Secondary | ICD-10-CM | POA: Diagnosis present

## 2022-10-26 DIAGNOSIS — M25562 Pain in left knee: Secondary | ICD-10-CM | POA: Insufficient documentation

## 2022-10-26 NOTE — Progress Notes (Signed)
Lumbar spine x-ray shows some arthritic changes in the low back.  There is also a kidney stone on the right.  I do not think your pain is coming from the kidney stone.  Plan to proceed to physical therapy.

## 2022-11-02 ENCOUNTER — Ambulatory Visit: Payer: Medicaid Other | Admitting: Physical Therapy

## 2022-11-09 ENCOUNTER — Telehealth: Payer: Medicaid Other | Admitting: Internal Medicine

## 2022-11-09 NOTE — Progress Notes (Signed)
Virtual Visit via Video Note  I connected with Christine Oneal on 11/09/22 at  9:40 AM EDT by a video enabled telemedicine application however she was unavailable and did not answer telephone. Message was left and she did not call back to do visit.   Myrlene Broker, MD

## 2022-11-11 ENCOUNTER — Ambulatory Visit: Payer: Medicaid Other | Admitting: Physical Therapy

## 2022-11-11 NOTE — Progress Notes (Deleted)
  Electrophysiology Office Follow up Visit Note:    Date:  11/11/2022   ID:  Christine Oneal, DOB 03/24/1971, MRN [id number]  PCP:  Myrlene Broker, MD  [facility] Cardiologist:  None  [facility] Electrophysiologist:  Lanier Prude, MD    Interval History:    Christine Oneal is a 53 y.o. female who presents for a follow up visit.   She had an AF ablation 08/03/2022 during which the veins, posterior wall and CTI were ablated. She saw Lupita Leash 08/31/2022. At that appt she was maintaining sinus rhythm. She takes amiodarone 200mg  PO daily. She takes eliquis for stroke ppx.         Past medical, surgical, social and family history were reviewed.  ROS:   Please see the history of present illness.    All other systems reviewed and are negative.  EKGs/Labs/Other Studies Reviewed:    The following studies were reviewed today:  EKG:  The ekg ordered today demonstrates ***   Physical Exam:    VS:  There were no vitals taken for this visit.    Wt Readings from Last 3 Encounters:  10/22/22 227 lb 12.8 oz (103.3 kg)  10/20/22 231 lb (104.8 kg)  10/15/22 228 lb (103.4 kg)     GEN: *** Well nourished, well developed in no acute distress CARDIAC: ***RRR, no murmurs, rubs, gallops RESPIRATORY:  Clear to auscultation without rales, wheezing or rhonchi       ASSESSMENT:    No diagnosis found. PLAN:    In order of problems listed above:    #Persistent AF and AFL. S/p Ablation 08/03/2022.  On eliquis for stroke ppx. Stop amiodarone today***  #Obesity Continued weight loss encouraged.  Follow up 6 months with APP.      Signed, Steffanie Dunn, MD, [facility], [facility] 11/11/2022 8:50 PM    Electrophysiology [facility]

## 2022-11-12 ENCOUNTER — Encounter: Payer: Self-pay | Admitting: Cardiology

## 2022-11-12 ENCOUNTER — Ambulatory Visit: Payer: Medicaid Other | Attending: Cardiology | Admitting: Cardiology

## 2022-11-12 VITALS — BP 114/58 | HR 61 | Ht 63.0 in | Wt 228.0 lb

## 2022-11-12 DIAGNOSIS — I4819 Other persistent atrial fibrillation: Secondary | ICD-10-CM | POA: Diagnosis not present

## 2022-11-12 DIAGNOSIS — I10 Essential (primary) hypertension: Secondary | ICD-10-CM

## 2022-11-12 NOTE — Progress Notes (Signed)
  Electrophysiology Office Follow up Visit Note:    Date:  11/12/2022   ID:  Christine Oneal, DOB May 02, 1971, MRN [id number]  PCP:  Myrlene Broker, MD  [facility] Cardiologist:  None  [facility] Elect rophysiologist:  Lanier Prude, MD    Interval History:    Christine Oneal is a 52 y.o. female who presents for a follow up visit.   She had an AF ablation 08/03/2022 during which the veins, posterior wall and CTI were ablated. She saw Lupita Leash 08/31/2022. At that appt she was maintaining sinus rhythm. She takes amiodarone 200mg  PO daily. She takes eliquis for stroke ppx.  Today, she reports no known issues with recurring arrhythmias. She has been compliant with amiodarone and Eliquis.  She reports that the groin surgical sites have healed well.  She denies any palpitations, chest pain, shortness of breath, or peripheral edema. No lightheadedness, headaches, syncope, orthopnea, or PND.     Past medical, surgical, social and family history were reviewed.  ROS:   Please see the history of present illness.    All other systems reviewed and are negative.  EKGs/Labs/Other Studies Reviewed:    The following studies were reviewed today:  EKG:  The ekg ordered today demonstrates sinus rhythm   Physical Exam:    VS:  BP (!) 114/58   Pulse 61   Ht 5\' 3"  (1.6 m)   Wt 228 lb (103.4 kg)   SpO2 99%   BMI 40.39 kg/m     Wt Readings from Last 3 Encounters:  11/12/22 228 lb (103.4 kg)  10/22/22 227 lb 12.8 oz (103.3 kg)  10/20/22 231 lb (104.8 kg)     GEN: Well nourished, well developed in no acute distress CARDIAC: RRR, no murmurs, rubs, gallops RESPIRATORY:  Clear to auscultation without rales, wheezing or rhonchi       ASSESSMENT:    1. Persistent atrial fibrillation (HCC)   2. Primary hypertension    PLAN:    In order of problems listed above:   #Persistent AF and AFL. S/p Ablation 08/03/2022.  On eliquis for stroke ppx. Stop amiodarone  today  #Hypertension At goal today.  Recommend checking blood pressures 1-2 times per week at home and recording the values.  Recommend bringing these recordings to the primary care physician.  #Obesity Continued weight loss encouraged.  Follow up 6 months with APP.   I,Mathew Stumpf,acting as a Neurosurgeon for Lanier Prude, MD.,have documented all relevant documentation on the behalf of Lanier Prude, MD,as directed by  Lanier Prude, MD while in the presence of Lanier Prude, MD.  I, Lanier Prude, MD, have reviewed all documentation for this visit. The documentation on 11/12/22 for the exam, diagnosis, procedures, and orders are all accurate and complete.   Signed, Steffanie Dunn, MD, [facility], [facility] 11/12/2022 10:52 AM    Electrophysiology [facility]

## 2022-11-12 NOTE — Patient Instructions (Signed)
Medication Instructions:  Your physician has recommended you make the following change in your medication:  STOP Amiodarone  *If you need a refill on your cardiac medications before your next appointment, please call your pharmacy*   Lab Work: None ordered   Testing/Procedures: None ordered   Follow-Up: At [facility], you and your health needs are our priority.  As part of our continuing mission to provide you with exceptional heart care, we have created designated Provider Care Teams.  These Care Teams include your primary Cardiologist (physician) and Advanced Practice Providers (APPs -  Physician Assistants and Nurse Practitioners) who all work together to provide you with the care you need, when you need it.  Your next appointment:   6 month(s)  The format for your next appointment:   In Person  Provider:   You will see one of the following Advanced Practice Providers on your designated Care Team:   Francis Dowse, [place] Casimiro Needle "Mardelle Matte" Lanna Poche, [place]  Thank you for choosing [facility]!!   [phone]

## 2022-11-16 ENCOUNTER — Ambulatory Visit: Payer: Medicaid Other | Admitting: Physical Therapy

## 2022-11-18 ENCOUNTER — Ambulatory Visit: Payer: Medicaid Other | Admitting: Physical Therapy

## 2022-11-22 ENCOUNTER — Telehealth: Payer: Self-pay | Admitting: Internal Medicine

## 2022-11-22 ENCOUNTER — Encounter: Payer: Self-pay | Admitting: Physical Therapy

## 2022-11-22 ENCOUNTER — Ambulatory Visit: Payer: Medicaid Other | Attending: Family Medicine | Admitting: Physical Therapy

## 2022-11-22 DIAGNOSIS — M25551 Pain in right hip: Secondary | ICD-10-CM | POA: Diagnosis present

## 2022-11-22 DIAGNOSIS — R2689 Other abnormalities of gait and mobility: Secondary | ICD-10-CM | POA: Diagnosis present

## 2022-11-22 DIAGNOSIS — M6281 Muscle weakness (generalized): Secondary | ICD-10-CM | POA: Diagnosis present

## 2022-11-22 DIAGNOSIS — G8929 Other chronic pain: Secondary | ICD-10-CM

## 2022-11-22 DIAGNOSIS — M25561 Pain in right knee: Secondary | ICD-10-CM | POA: Diagnosis present

## 2022-11-22 DIAGNOSIS — M25562 Pain in left knee: Secondary | ICD-10-CM | POA: Insufficient documentation

## 2022-11-22 NOTE — Therapy (Signed)
OUTPATIENT PHYSICAL THERAPY TREATMENT NOTE   Patient Name: Christine Oneal MRN: [id number] DOB:Nov 28, 1970, 52 y.o., female Today's Date: 11/22/2022  PCP: Myrlene Broker, MD   REFERRING PROVIDER: Rodolph Bong, MD  END OF SESSION:   PT End of Session - 11/22/22 1017     Visit Number 2    Number of Visits 17    Date for PT Re-Evaluation 12/21/22    Authorization Type [facility]    Authorization Time Period 27 visit annual limit    PT Start Time 1020    PT Stop Time 1100    PT Time Calculation (min) 40 min             Past Medical History:  Diagnosis Date   Abnormal Pap smear of cervix    Allergy    Anemia    CHF (congestive heart failure) (HCC)    Fibroid    Hypertension    Leukocytosis, unspecified 10/18/2013   Obesity    Plantar fasciitis    Past Surgical History:  Procedure Laterality Date   ATRIAL FIBRILLATION ABLATION N/A 08/03/2022   Procedure: ATRIAL FIBRILLATION ABLATION;  Surgeon: Lanier Prude, MD;  [facility];  Service: Cardiovascular;  Laterality: N/A;   CARDIOVERSION N/A 05/12/2021   Procedure: CARDIOVERSION;  Surgeon: Dolores Patty, MD;  Location: [facility] ENDOSCOPY;  Service: Cardiovascular;  Laterality: N/A;   CARDIOVERSION N/A 05/29/2021   Procedure: CARDIOVERSION;  Surgeon: Dolores Patty, MD;  Location: [facility] ENDOSCOPY;  Service: Cardiovascular;  Laterality: N/A;   COLPOSCOPY     RIGHT/LEFT HEART CATH AND CORONARY ANGIOGRAPHY N/A 05/11/2021   Procedure: RIGHT/LEFT HEART CATH AND CORONARY ANGIOGRAPHY;  Surgeon: Dolores Patty, MD;  [facility]: [facility];  Service: Cardiovascular;  Laterality: N/A;   TEE WITHOUT CARDIOVERSION N/A 05/12/2021   Procedure: TRANSESOPHAGEAL ECHOCARDIOGRAM (TEE);  Surgeon: Dolores Patty, MD;  Location: [facility] ENDOSCOPY;  Service: Cardiovascular;  Laterality: N/A;   TUBAL LIGATION  2001   Patient Active Problem List   Diagnosis Date Noted   Urinary incontinence 08/23/2022    Chronic pain of both knees 08/23/2022   Gait instability 08/23/2022   Dysmenorrhea 05/14/2022   Left foot pain 05/14/2022   Encounter for general adult medical examination with abnormal findings 04/17/2022   Primary hyperparathyroidism (HCC) 11/25/2021   Chronic systolic CHF (congestive heart failure) (HCC) 05/26/2021   Unspecified atrial fibrillation (HCC) 05/04/2021   Anemia 09/04/2017   Morbid obesity (HCC) 09/03/2017   Reactive airway disease 09/03/2017   Vitamin D deficiency 09/03/2017   Essential hypertension 10/24/2007   Hemorrhoids 10/24/2007    REFERRING DIAG: M25.561,M25.562,G89.29 (ICD-10-CM) - Chronic pain of both knees  THERAPY DIAG:  Chronic pain of both knees  Other abnormalities of gait and mobility  Muscle weakness (generalized)  Rationale for Evaluation and Treatment Rehabilitation  PERTINENT HISTORY: HTN, CHF, afib (pt states well controlled since ablation in January although does have some exertional SOB with activity)  PRECAUTIONS: fall hx, cardiac hx   SUBJECTIVE:  SUBJECTIVE STATEMENT:  The knees are okay. The right groin is what hurts mostly when I walk. Right knee hurts some.    PAIN:  Are you having pain: 2-3/10 Location/description: B knees R>L Best-worst over past week: 2-7/10  - aggravating factors: stair navigation, standing/walking >16min, sleeping R side (wakes up 3x/night on average), sitting >62min - Easing factors: rest, changing positions, medication, voltaren     OBJECTIVE: (objective measures completed at initial evaluation unless otherwise dated)   NEXT MD VISIT: about a month from eval per pt, cardiologist May 3rd    OBJECTIVE:    DIAGNOSTIC FINDINGS:  Lumbar XR 10/22/22 IMPRESSION: 1. Mild degenerative joint changes of mid to lower lumbar  spine. 2. Right kidney stone.   BIL knee XR February 2024 with degenerative changes, see EPIC for details   PATIENT SURVEYS:  LEFS eval - 43/80   COGNITION: Overall cognitive status: Within functional limits for tasks assessed                                    SENSATION: Light touch intact B LE      PALPATION: Concordant tenderness B lat quads and TFL, no pain with gentle patellar mobs in all directions, no joint line or tendon tenderness    LOWER EXTREMITY ROM:      Active  Right eval Left eval  Hip flexion      Hip extension      Hip internal rotation      Hip external rotation      Knee extension 0 0  Knee flexion 120 deg s 110 deg painless  (Blank rows = not tested) (Key: WFL = within functional limits not formally assessed, * = concordant pain, s = stiffness/stretching sensation, NT = not tested)  Comments:    LOWER EXTREMITY MMT:     MMT Right eval Left eval  Hip flexion 4 3+  Hip abduction (modified sitting) 4 4  Hip internal rotation      Hip external rotation      Knee flexion 4+ 4+  Knee extension 4 *  4+  Ankle dorsiflexion        (Blank rows = not tested) (Key: WFL = within functional limits not formally assessed, * = concordant pain, s = stiffness/stretching sensation, NT = not tested)  Comments:     FUNCTIONAL TESTS:  5xSTS: 14.66 sec w UE support from standard chair no increase in pain   GAIT: Distance walked: within clinic Assistive device utilized: None Level of assistance: Complete Independence Comments: reduced gait speed/cadence, widened BOS, reduced trunk rotation/arm swing      TODAY'S TREATMENT:                                                                                                                              [facility] Adult PT Treatment:  DATE: 11/22/22 Therapeutic Exercise: LAQ 5 x 2 each Seated March 5 x 2 each Seated ball squeeze  x 10 PPT x 10 Bridge  x 10 SLR - 5 reps  right- 1/2 rom SLR left x 10  Clam Green band supine x 15  Supine March  5 x 2  Hip flexor stretch, EOM right  3 x 30 se  Supine hamstring stretch with strap  STS x 10 Updated HEP    [facility] Adult PT Treatment:                                                DATE: 10/26/22 Therapeutic Exercise: SLR x5 each LE LAQ x5 each LE Seated heel/toe raises x10 BIL  Education for [employer], handout provided     PATIENT EDUCATION:  Education details: Pt education on PT impairments, prognosis, and POC. Informed consent. Rationale for interventions, safe/appropriate HEP performance Person educated: Patient Education method: Explanation, Demonstration, Tactile cues, Verbal cues, and Handouts Education comprehension: verbalized understanding, returned demonstration, verbal cues required, tactile cues required, and needs further education     HOME EXERCISE PROGRAM: Access Code: [email or web address] URL: [email or web address] Date: 10/26/2022 Prepared by: Fransisco Hertz   Exercises - Seated Long Arc Quad  - 1 x daily - 7 x weekly - 2 sets - 10 reps - Seated Heel Toe Raises  - 1 x daily - 7 x weekly - 2 sets - 10 reps 11/22/22 - Seated March  - 1 x daily - 7 x weekly - 1 sets - 10 reps - 3 hold - Sit to stand with control  - 1 x daily - 7 x weekly - 1 sets - 10 reps - Modified Thomas Stretch  - 1 x daily - 7 x weekly - 1 sets - 3 reps - 20-30 hold   ASSESSMENT:   CLINICAL IMPRESSION: Patient is a pleasant 52 y.o. woman who was seen today for physical therapy treatment for B knee pain. Today is her first treatment since evaluation nearly 1 month ago due to transportation issues. Pt reports low level knee pain however c/o right groin pain that occurs more when walking. Reviewed HEP which she is independent with. STG# 1 met. Progressed with gentle hip/quad strength with pt having difficulty lifting Right SLR through full ROM, c/o heaviness. Pt is also more uncomfortable in hooklying due to low  back pain. Right hip flexor stretch was tolerated well with good stretch felt in the area of her pain. Updated HEP to include hip flexor stretch,gently and light hip flexor strengthening. Will assess response next visit.    Eval: Pt endorses ongoing symptoms for several years but have worsened over past year, increasing difficulties with ADLs/mobility. On exam pt demos mild knee flexion deficits on L although symptoms more prominent on R, mild weakness throughout B LE. Concordant pain elicited with quad MMT. 5xSTS time at cutoff score for fall risk in most populations (12-14sec) and indicative of reduced LE strength and functional mobility. Pt tolerates session well, reports improved symptoms after HEP with education for appropriate performance. Recommend skilled PT to address relevant deficits to maximize functional tolerance.   OBJECTIVE IMPAIRMENTS: decreased activity tolerance, decreased balance, decreased endurance, decreased mobility, difficulty walking, decreased ROM, decreased strength, and pain.    ACTIVITY LIMITATIONS: carrying, bending, sitting, standing, squatting, sleeping, stairs, transfers, and locomotion level   PARTICIPATION  LIMITATIONS: meal prep, cleaning, laundry, community activity, and occupation   PERSONAL FACTORS: Time since onset of injury/illness/exacerbation and 3+ comorbidities: afib/CHF, HTN, MSK comorbidities (LBP)  are also affecting patient's functional outcome.    REHAB POTENTIAL: Good   CLINICAL DECISION MAKING: Stable/uncomplicated   EVALUATION COMPLEXITY: Low     GOALS: Goals reviewed with patient? No given time constraints   SHORT TERM GOALS: Target date: 11/23/2022   Pt will demonstrate appropriate understanding and performance of initially prescribed HEP in order to facilitate improved independence with management of symptoms.  Baseline: HEP provided on eval 11/22/22: requires cues  Goal status: ONGOING   2. Pt will score greater than or equal to 52  on LEFS in order to demonstrate improved perception of function due to symptoms.             Baseline: 43/80  11/22/22: not tested due to absence from PT             Goal status: ONGOING   LONG TERM GOALS: Target date: 12/21/2022 Pt will score 60 or greater on LEFS in order to demonstrate improved perception of function due to symptoms.  Baseline: 43/80 Goal status: INITIAL   2.  Pt will demonstrate at least 120 degrees of knee flexion ROM bilaterally in order to facilitate improved mechanics with gait/transfers. Baseline: see ROM chart above Goal status: INITIAL   3.  Pt will demonstrate appropriate performance of final prescribed HEP in order to facilitate improved self-management of symptoms post-discharge.              Baseline: initial HEP prescribed             Goal status: INITIAL     4.  Pt will be able to perform 5xSTS in less than or equal to 12sec without UE support in order to demonstrate reduced fall risk and improved functional independence (MCID 5xSTS = 2.3 sec). Baseline: 14.66sec w UE support Goal status: INITIAL    5. Pt will report at least 50% decrease in overall pain levels in past week in order to facilitate improved tolerance to basic ADLs/mobility.              Baseline: 2-7/10             Goal status: INITIAL     6. Pt will report/demonstrate ability to stand/walk for up to 45 min with less than 3 pt increase in resting pain in order to promote improved community navigation and exercise tolerance for overall health/QOL.              Baseline: pain >31min of standing/walking             Goal status: INITIAL      PLAN:   PT FREQUENCY: 1-2x/week (plan 2x/week for 4 weeks, taper to 1x/week subsequently)   PT DURATION: 8 weeks   PLANNED INTERVENTIONS: Therapeutic exercises, Therapeutic activity, Neuromuscular re-education, Balance training, Gait training, Patient/Family education, Self Care, Joint mobilization, Aquatic Therapy, Dry Needling, Cryotherapy, Moist  heat, Taping, Manual therapy, and Re-evaluation   PLAN FOR NEXT SESSION: Progress ROM/strengthening exercises as able/appropriate, review HEP. Emphasis on comfortable quad activation, hip strengthening.    Jannette Spanner, PTA 11/22/22 11:10 AM Phone: [phone] Fax: [phone]

## 2022-11-22 NOTE — Telephone Encounter (Signed)
Patient called and said [facility] faxed something over today 11/22/2022. She would like to know if it has been received. Best callback is [phone].

## 2022-11-23 ENCOUNTER — Encounter: Payer: Self-pay | Admitting: Internal Medicine

## 2022-11-23 ENCOUNTER — Ambulatory Visit (INDEPENDENT_AMBULATORY_CARE_PROVIDER_SITE_OTHER): Payer: Medicaid Other | Admitting: Internal Medicine

## 2022-11-23 VITALS — BP 104/68 | HR 70 | Ht 63.0 in | Wt 230.0 lb

## 2022-11-23 DIAGNOSIS — E559 Vitamin D deficiency, unspecified: Secondary | ICD-10-CM | POA: Diagnosis not present

## 2022-11-23 DIAGNOSIS — E210 Primary hyperparathyroidism: Secondary | ICD-10-CM

## 2022-11-23 LAB — BASIC METABOLIC PANEL
BUN: 23 mg/dL (ref 6–23)
CO2: 25 mEq/L (ref 19–32)
Calcium: 10.3 mg/dL (ref 8.4–10.5)
Chloride: 106 mEq/L (ref 96–112)
Creatinine, Ser: 1.3 mg/dL — ABNORMAL HIGH (ref 0.40–1.20)
GFR: 47 mL/min — ABNORMAL LOW (ref >60.00)
Glucose, Bld: 80 mg/dL (ref 70–99)
Potassium: 4.1 mEq/L (ref 3.5–5.1)
Sodium: 138 mEq/L (ref 135–145)

## 2022-11-23 LAB — ALBUMIN: Albumin: 3.6 g/dL (ref 3.5–5.2)

## 2022-11-23 LAB — VITAMIN D 25 HYDROXY (VIT D DEFICIENCY, FRACTURES): VITD: 28.96 ng/mL — ABNORMAL LOW (ref 30.00–100.00)

## 2022-11-23 NOTE — Patient Instructions (Signed)
-   Stay Hydrated  - Avoid over the counter calcium tablet  - Consume 2-3 servings of calcium daily ( low fat dairy, green leafy vegetables )    

## 2022-11-23 NOTE — Progress Notes (Unsigned)
Name: Christine Oneal  MRN/ DOB: [id number], 03-13-1971    Age/ Sex: 52 y.o., female    PCP: Myrlene Broker, MD   Reason for Endocrinology Evaluation: Hypercalcemia      Date of Initial Endocrinology Evaluation: 08/24/2021     HPI: Christine Oneal is a 52 y.o. female with a past medical history of A. Fib, CHF, OSA. The patient presented for initial endocrinology clinic visit on 2/13/20223 for consultative assistance with her hypercalcemia .   [name] indicates that she was first diagnosed with hypercalcemia in 2021, with a serum a max calcium of 13.9 mg/dL ( uncorrected) in 40/9811 with elevated PTH 91 pg/mL .   Of note, the pt was hospitalized 05/2021 and pamidronate was given     She denies  history of kidney stones, kidney disease, liver disease, granulomatous disease. She denies  osteoporosis or prior fractures. Daily dietary calcium intake: 2 servings . She denies  family history of osteoporosis, parathyroid disease   24-hour urinary calcium 419 mg in November 2022  Sister with  thyroid disease.   DXA low bone density with a T score -1.3 at the AP spine 08/31/2021   SUBJECTIVE:     Today (11/23/22): Christine Oneal  is here for follow-up on hypercalcemia.   She was seen by Dr. Gerrit Friends in October 2023 for additional evaluation of parathyroidectomy.  Nuclear parathyroid scan showed positive exam for presence of left superior parathyroid adenoma and suspicion of a subtle coexistent right inferior parathyroid adenoma 05/27/2022  She is as/P ablation 07/2022 for A-fib  Denies palpitations  Denies SOB  Has occasional constipation  Denies Polyuria and polydipsia   She has avoided OTC calcium tablet  She consumes 1-2 servings of dietary calcium a day  Vitamin D3 2000 iu daily     HISTORY:  Past Medical History:  Past Medical History:  Diagnosis Date   Abnormal Pap smear of cervix    Allergy    Anemia    CHF (congestive heart failure) (HCC)     Fibroid    Hypertension    Leukocytosis, unspecified 10/18/2013   Obesity    Plantar fasciitis    Past Surgical History:  Past Surgical History:  Procedure Laterality Date   ATRIAL FIBRILLATION ABLATION N/A 08/03/2022   Procedure: ATRIAL FIBRILLATION ABLATION;  Surgeon: Lanier Prude, MD;  [facility]: [facility] INVASIVE CV LAB;  Service: Cardiovascular;  Laterality: N/A;   CARDIOVERSION N/A 05/12/2021   Procedure: CARDIOVERSION;  Surgeon: Dolores Patty, MD;  Location: [facility] ENDOSCOPY;  Service: Cardiovascular;  Laterality: N/A;   CARDIOVERSION N/A 05/29/2021   Procedure: CARDIOVERSION;  Surgeon: Dolores Patty, MD;  Location: [facility] ENDOSCOPY;  Service: Cardiovascular;  Laterality: N/A;   COLPOSCOPY     RIGHT/LEFT HEART CATH AND CORONARY ANGIOGRAPHY N/A 05/11/2021   Procedure: RIGHT/LEFT HEART CATH AND CORONARY ANGIOGRAPHY;  Surgeon: Dolores Patty, MD;  Location: [facility]VASIVE CV LAB;  Service: Cardiovascular;  Laterality: N/A;   TEE WITHOUT CARDIOVERSION N/A 05/12/2021   Procedure: TRANSESOPHAGEAL ECHOCARDIOGRAM (TEE);  Surgeon: Dolores Patty, MD;  Location: [facility] ENDOSCOPY;  Service: Cardiovascular;  Laterality: N/A;   TUBAL LIGATION  2001    Social History:  reports that she has never smoked. She has never used smokeless tobacco. She reports that she does not drink alcohol and does not use drugs. Family History: family history includes Cervical cancer in her mother; Diabetes in her sister; Hypertension in her brother, brother, father, and mother; Stroke in her  father; Uterine cancer in her maternal grandmother.   HOME MEDICATIONS: Allergies as of 11/23/2022       Reactions   Lisinopril-hydrochlorothiazide Anaphylaxis   Angioedema   Other Anaphylaxis        Medication List        Accurate as of Nov 23, 2022 10:21 AM. If you have any questions, ask your nurse or doctor.          acetaminophen 500 MG tablet Commonly known as: TYLENOL Take 1,000 mg by mouth every 6  (six) hours as needed for moderate pain or headache.   azelastine 0.1 % nasal spray Commonly known as: ASTELIN Place 1 spray into both nostrils 2 (two) times daily. Use in each nostril as directed What changed:  when to take this reasons to take this   benzonatate 100 MG capsule Commonly known as: TESSALON Take 1 capsule (100 mg total) by mouth 3 (three) times daily as needed for cough.   carvedilol 6.25 MG tablet Commonly known as: COREG TAKE 1 TABLET BY MOUTH TWICE A DAY   dapagliflozin propanediol 10 MG Tabs tablet Commonly known as: Farxiga Take 1 tablet (10 mg total) by mouth daily.   dextromethorphan-guaiFENesin 30-600 MG 12hr tablet Commonly known as: MUCINEX DM Take 1 tablet by mouth as needed (cold).   diphenhydrAMINE 25 MG tablet Commonly known as: BENADRYL Take 25 mg by mouth at bedtime as needed.   Eliquis 5 MG Tabs tablet Generic drug: apixaban TAKE 1 TABLET BY MOUTH TWICE DAILY   famotidine 20 MG tablet Commonly known as: PEPCID Take 20 mg by mouth as needed.   fluticasone 50 MCG/ACT nasal spray Commonly known as: FLONASE Place 2 sprays into both nostrils as needed for allergies or rhinitis.   gabapentin 100 MG capsule Commonly known as: NEURONTIN Take 1 capsule (100 mg total) by mouth at bedtime. 100-300mg  daily at bedtime   guaiFENesin 600 MG 12 hr tablet Commonly known as: MUCINEX Take 600 mg by mouth 2 (two) times daily as needed for cough or to loosen phlegm.   hydrALAZINE 50 MG tablet Commonly known as: APRESOLINE Take 1 tablet (50 mg total) by mouth 3 (three) times daily.   hydrocortisone 2.5 % cream Apply topically 2 (two) times daily. What changed:  how much to take when to take this reasons to take this   hydrocortisone 2.5 % rectal cream Commonly known as: Anusol-HC Place 1 Application rectally 2 (two) times daily.   isosorbide mononitrate 30 MG 24 hr tablet Commonly known as: IMDUR Take 1 tablet (30 mg total) by mouth  daily.   Klor-Con M20 20 MEQ tablet Generic drug: potassium chloride SA TAKE 1 TABLET BY MOUTH AS NEEDED( TAKE WHEN TAKING TORSEMIDE)   losartan 50 MG tablet Commonly known as: COZAAR Take 1 tablet (50 mg total) by mouth in the morning and at bedtime.   norethindrone-ethinyl estradiol 1-20 MG-MCG tablet Commonly known as: MICROGESTIN Take 1 tablet by mouth daily.   nystatin powder Commonly known as: MYCOSTATIN/NYSTOP Apply 1 Application topically 3 (three) times daily.   predniSONE 50 MG tablet Commonly known as: DELTASONE Take 1 pill daily for 5 days   Probiotic Chew Chew 2 capsules by mouth daily.   spironolactone 25 MG tablet Commonly known as: ALDACTONE TAKE 1 TABLET (25 MG TOTAL) BY MOUTH DAILY.   torsemide 20 MG tablet Commonly known as: DEMADEX TAKE 1 TABLET BY MOUTH AS NEEDED(WHEN WEIGHT IS UP OR NOTES SWELLING)   triamcinolone cream 0.1 % Commonly  known as: KENALOG Apply 1 Application topically 2 (two) times daily. What changed:  when to take this reasons to take this   Vitamin D3 25 MCG (1000 UT) Caps Take 2,000 Units by mouth daily in the afternoon.   Vitamin D3 50 MCG (2000 UT) Tabs Take 1 tablet by mouth daily.          REVIEW OF SYSTEMS: A comprehensive ROS was conducted with the patient and is negative except as per HPI     OBJECTIVE:  VS: BP 104/68 (BP Location: Left Arm, Patient Position: Sitting, Cuff Size: Large)   Pulse 70   Ht 5\' 3"  (1.6 m)   Wt 230 lb (104.3 kg)   SpO2 99%   BMI 40.74 kg/m    Wt Readings from Last 3 Encounters:  11/23/22 230 lb (104.3 kg)  11/12/22 228 lb (103.4 kg)  10/22/22 227 lb 12.8 oz (103.3 kg)     EXAM: General: Pt appears well and is in NAD  Eyes: External eye exam normal without stare, lid lag or exophthalmos.  EOM intact.   Neck: General: Supple without adenopathy. Thyroid: Thyroid size normal.  No goiter or nodules appreciated.  Lungs: Clear with good BS bilat with no rales, rhonchi, or  wheezes  Heart: Auscultation: RRR.  Extremities:  BL LE: No pretibial edema   Mental Status: Judgment, insight: Intact Orientation: Oriented to time, place, and person Mood and affect: No depression, anxiety, or agitation     DATA REVIEWED: ****    Latest Reference Range & Units 05/28/21 09:45  Calcium, 24 hour urine 0 - 320 mg/24 hr 419 (H)     Thyroid Ultrasound 05/28/2021   Estimated total number of nodules >/= 1 cm: 0   Number of spongiform nodules >/=  2 cm not described below (TR1): 0   Number of mixed cystic and solid nodules >/= 1.5 cm not described below (TR2): 0   _________________________________________________________   Subcentimeter hypoechoic nodule in the posterior aspect the right thyroid lobe does not meet criteria for further dedicated follow-up or biopsy. No other discrete nodules are seen within the thyroid gland.   Posterior to the left thyroid lobe, there is a hypoechoic ovoid lesion the measures up to 2.1 x 0.8 x 1.0 cm.   IMPRESSION: 1. Normal sonographic appearance of the thyroid gland. 2. Posterior to the left thyroid lobe, there is a hypoechoic ovoid lesion that measures up to approximately 2 cm in size. Differential considerations include enlarged parathyroid gland versus lymph node.    ASSESSMENT/PLAN/RECOMMENDATIONS:   Primary hyperparathyroidism:  -Patient is status post pamidronate in November 2022 for severe hypercalcemia up to 13.9 mg/DL -Patient met criteria for surgical intervention based on 24-hour urine calcium excretion elevated at 419 mg and a GFR <60 -She was seen by Dr. 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  Vitamin D deficiency:  -Vitamin D remains low will increase  vitamin D  2000 IU daily     Follow-up in 6 months  Signed electronically by: Lyndle Herrlich, MD  [facility] Endocrinology  [facility] [address]ure[address] [place], [place] [address] Phone: [phone] FAX: [phone]   CC: Myrlene Broker, MD [address] [place] [address] Phone: [phone] Fax: [phone]   Return to Endocrinology clinic as below: Future Appointments  Date Time Provider Department Center  11/23/2022 10:30 AM Danely Bayliss, Konrad Dolores, MD [facility] None  11/25/2022 10:15 AM Ashley Murrain, PT [facility]  11/26/2022 10:30 AM Rodolph Bong, MD [facility] None  11/30/2022 10:15 AM Greta Doom [facility]  12/02/2022 10:15 AM Ashley Murrain, PT [facility]  12/08/2022 10:40 AM [facility] NURSE [facility] None

## 2022-11-23 NOTE — Telephone Encounter (Signed)
Received and placed inside the providers box

## 2022-11-24 NOTE — Therapy (Signed)
OUTPATIENT PHYSICAL THERAPY TREATMENT NOTE   Patient Name: Christine Oneal MRN: [id number] DOB:01-Jan-1971, 52 y.o., female Today's Date: 11/25/2022  PCP: Myrlene Broker, MD   REFERRING PROVIDER: Rodolph Bong, MD  END OF SESSION:   PT End of Session - 11/25/22 0940     Visit Number 3    Number of Visits 17    Date for PT Re-Evaluation 12/21/22    Authorization Type [employer]    Authorization Time Period 27 visit annual limit    PT Start Time 0945    PT Stop Time 1025    PT Time Calculation (min) 40 min    Activity Tolerance Patient tolerated treatment well    Behavior During Therapy [facility] for tasks assessed/performed              Past Medical History:  Diagnosis Date   Abnormal Pap smear of cervix    Allergy    Anemia    CHF (congestive heart failure) (HCC)    Fibroid    Hypertension    Leukocytosis, unspecified 10/18/2013   Obesity    Plantar fasciitis    Past Surgical History:  Procedure Laterality Date   ATRIAL FIBRILLATION ABLATION N/A 08/03/2022   Procedure: ATRIAL FIBRILLATION ABLATION;  Surgeon: Lanier Prude, MD;  Location: [facility] IN[facility]ASIVE [facility];  Service: Cardiovascular;  Laterality: N/A;   CARDIOVERSION N/A 05/12/2021   Procedure: CARDIOVERSION;  Surgeon: Dolores Patty, MD;  Location: [facility] ENDOSCOPY;  Service: Cardiovascular;  Laterality: N/A;   CARDIOVERSION N/A 05/29/2021   Procedure: CARDIOVERSION;  Surgeon: Dolores Patty, MD;  Location: [facility] ENDOSCOPY;  Service: Cardiovascular;  Laterality: N/A;   COLPOSCOPY     RIGHT/LEFT HEART CATH AND CORONARY ANGIOGRAPHY N/A 05/11/2021   Procedure: RIGHT/LEFT HEART CATH AND CORONARY ANGIOGRAPHY;  Surgeon: Dolores Patty, MD;  Location: [facility] INVASIVE CV LAB;  Service: Cardiovascular;  Laterality: N/A;   TEE WITHOUT CARDIOVERSION N/A 05/12/2021   Procedure: TRANSESOPHAGEAL ECHOCARDIOGRAM (TEE);  Surgeon: Dolores Patty, MD;  Location: [facility] ENDOSCOPY;  Service: Cardiovascular;  Laterality: N/A;    TUBAL LIGATION  2001   Patient Active Problem List   Diagnosis Date Noted   Urinary incontinence 08/23/2022   Chronic pain of both knees 08/23/2022   Gait instability 08/23/2022   Dysmenorrhea 05/14/2022   Left foot pain 05/14/2022   Encounter for general adult medical examination with abnormal findings 04/17/2022   Primary hyperparathyroidism (HCC) 11/25/2021   Chronic systolic CHF (congestive heart failure) (HCC) 05/26/2021   Unspecified atrial fibrillation (HCC) 05/04/2021   Anemia 09/04/2017   Morbid obesity (HCC) 09/03/2017   Reactive airway disease 09/03/2017   Vitamin D deficiency 09/03/2017   Essential hypertension 10/24/2007   Hemorrhoids 10/24/2007    REFERRING DIAG: M25.561,M25.562,G89.29 (ICD-10-CM) - Chronic pain of both knees  THERAPY DIAG:  Chronic pain of both knees  Other abnormalities of gait and mobility  Muscle weakness (generalized)  Rationale for Evaluation and Treatment Rehabilitation  PERTINENT HISTORY: HTN, CHF, afib (pt states well controlled since ablation in January although does have some exertional SOB with activity)  PRECAUTIONS: fall hx, cardiac hx   SUBJECTIVE:  SUBJECTIVE STATEMENT:   Pt denies any significant knee pain, most of her pain in R groin. Knees feeling better since last session, although still feels heavy. Groin pain is her biggest issue, mostly when walking.     PAIN:  Are you having pain: 1-2/10 Location/description: B knees R>L Best-worst over past week: 2-7/10  - aggravating factors: stair navigation, standing/walking >72min, sleeping R side (wakes up 3x/night on average), sitting >26min - Easing factors: rest, changing positions, medication, voltaren     OBJECTIVE: (objective measures completed at initial evaluation unless otherwise  dated)   NEXT MD VISIT: about a month from eval per pt, cardiologist May 3rd    OBJECTIVE: *Unless otherwise noted by date, all objective measures were captured at initial evaluation.     DIAGNOSTIC FINDINGS:  Lumbar XR 10/22/22 IMPRESSION: 1. 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 2. Right kidney stone.   BIL knee XR February 2024 with degenerative changes, see EPIC for details   PATIENT SURVEYS:  LEFS eval - 43/80   COGNITION: Overall cognitive status: Within functional limits for tasks assessed                                    SENSATION: Light touch intact B LE      PALPATION: Concordant tenderness B lat quads and TFL, no pain with gentle patellar mobs in all directions, no joint line or tendon tenderness    LOWER EXTREMITY ROM:      Active  Right eval Left eval  Hip flexion      Hip extension      Hip internal rotation      Hip external rotation      Knee extension 0 0  Knee flexion 120 deg s 110 deg painless  (Blank rows = not tested) (Key: WFL = within functional limits not formally assessed, * = concordant pain, s = stiffness/stretching sensation, NT = not tested)  Comments:    LOWER EXTREMITY MMT:     MMT Right eval Left eval  Hip flexion 4 3+  Hip abduction (modified sitting) 4 4  Hip internal rotation      Hip external rotation      Knee flexion 4+ 4+  Knee extension 4 *  4+  Ankle dorsiflexion        (Blank rows = not tested) (Key: WFL = within functional limits not formally assessed, * = concordant pain, s = stiffness/stretching sensation, NT = not tested)  Comments:     FUNCTIONAL TESTS:  5xSTS: 14.66 sec w UE support from standard chair no increase in pain   GAIT: Distance walked: within clinic Assistive device utilized: None Level of assistance: Complete Independence Comments: reduced gait speed/cadence, widened BOS, reduced trunk rotation/arm swing      TODAY'S TREATMENT:  [facility] Adult PT Treatment:                                                DATE: 11/25/22 Therapeutic Exercise: LAQ 2x8 BIL cues for pacing Heel/toe raises 2x10 cues for setup and form Seated march 2x8 BIL LE cues for posture Standing hip ext at counter x8 w UE support cues for upright posture  Standing hip abd at counter x8 BUE support STS x12 lowest mat gentle UE support at thighs cues for pacing  Edge of mat hip flexor stretch 3x30sec RUE Seated RTB hip abduction 3x8 cues for posture, pacing Sidestepping at counter 3 laps cues for foot positioning, pacing, and step length  Verbal HEP review/education  [facility] Adult PT Treatment:                                                DATE: 11/22/22 Therapeutic Exercise: LAQ 5 x 2 each Seated March 5 x 2 each Seated ball squeeze  x 10 PPT x 10 Bridge  x 10 SLR - 5 reps right- 1/2 rom SLR left x 10  Clam Green band supine x 15  Supine March  5 x 2  Hip flexor stretch, EOM right  3 x 30 se  Supine hamstring stretch with strap  STS x 10 Updated HEP    [facility] Adult PT Treatment:                                                DATE: 10/26/22 Therapeutic Exercise: SLR x5 each LE LAQ x5 each LE Seated heel/toe raises x10 BIL  Education for [employer], handout provided     PATIENT EDUCATION:  Education details: rationale for interventions Person educated: Patient Education method: Explanation, Demonstration, Tactile cues, Verbal cues, and Handouts Education comprehension: verbalized understanding, returned demonstration, verbal cues required, tactile cues required, and needs further education     HOME EXERCISE PROGRAM: Access Code: [email or web address] URL: [email or web address] Date: 10/26/2022 Prepared by: Fransisco Hertz   Exercises - Seated Long Arc Quad  - 1 x daily - 7 x weekly - 2 sets - 10 reps - Seated Heel Toe Raises  - 1 x daily - 7 x  weekly - 2 sets - 10 reps 11/22/22 - Seated March  - 1 x daily - 7 x weekly - 1 sets - 10 reps - 3 hold - Sit to stand with control  - 1 x daily - 7 x weekly - 1 sets - 10 reps - Modified Thomas Stretch  - 1 x daily - 7 x weekly - 1 sets - 3 reps - 20-30 hold   ASSESSMENT:   CLINICAL IMPRESSION: 11/25/2022 Pt arrives w/ 1-2/10 pain in knees, reports some improvement but continues with weakness/heaviness. Also reports her R groin seems more irritated, states she is seeing MD tomorrow about it. Today focusing on progression for volume with familiar program and increasing time spent in WB positions to improve closed chain tolerance. Reports some muscular fatigue/pulling with exercises but denies overt pain. No adverse events. Departs with report of improved pain, mild muscular  fatigue. Pt departs today's session in no acute distress, all voiced questions/concerns addressed appropriately from PT perspective.     Eval: Pt endorses ongoing symptoms for several years but have worsened over past year, increasing difficulties with ADLs/mobility. On exam pt demos mild knee flexion deficits on L although symptoms more prominent on R, mild weakness throughout B LE. Concordant pain elicited with quad MMT. 5xSTS time at cutoff score for fall risk in most populations (12-14sec) and indicative of reduced LE strength and functional mobility. Pt tolerates session well, reports improved symptoms after HEP with education for appropriate performance. Recommend skilled PT to address relevant deficits to maximize functional tolerance.   OBJECTIVE IMPAIRMENTS: decreased activity tolerance, decreased balance, decreased endurance, decreased mobility, difficulty walking, decreased ROM, decreased strength, and pain.    ACTIVITY LIMITATIONS: carrying, bending, sitting, standing, squatting, sleeping, stairs, transfers, and locomotion level   PARTICIPATION LIMITATIONS: meal prep, cleaning, laundry, community activity, and  occupation   PERSONAL FACTORS: Time since onset of injury/illness/exacerbation and 3+ comorbidities: afib/CHF, HTN, MSK comorbidities (LBP)  are also affecting patient's functional outcome.    REHAB POTENTIAL: Good   CLINICAL DECISION MAKING: Stable/uncomplicated   EVALUATION COMPLEXITY: Low     GOALS: Goals reviewed with patient? No given time constraints   SHORT TERM GOALS: Target date: 11/23/2022   Pt will demonstrate appropriate understanding and performance of initially prescribed HEP in order to facilitate improved independence with management of symptoms.  Baseline: HEP provided on eval 11/22/22: requires cues  Goal status: ONGOING   2. Pt will score greater than or equal to 52 on LEFS in order to demonstrate improved perception of function due to symptoms.             Baseline: 43/80  11/22/22: not tested due to absence from PT             Goal status: ONGOING   LONG TERM GOALS: Target date: 12/21/2022 Pt will score 60 or greater on LEFS in order to demonstrate improved perception of function due to symptoms.  Baseline: 43/80 Goal status: INITIAL   2.  Pt will demonstrate at least 120 degrees of knee flexion ROM bilaterally in order to facilitate improved mechanics with gait/transfers. Baseline: see ROM chart above Goal status: INITIAL   3.  Pt will demonstrate appropriate performance of final prescribed HEP in order to facilitate improved self-management of symptoms post-discharge.              Baseline: initial HEP prescribed             Goal status: INITIAL     4.  Pt will be able to perform 5xSTS in less than or equal to 12sec without UE support in order to demonstrate reduced fall risk and improved functional independence (MCID 5xSTS = 2.3 sec). Baseline: 14.66sec w UE support Goal status: INITIAL    5. Pt will report at least 50% decrease in overall pain levels in past week in order to facilitate improved tolerance to basic ADLs/mobility.              Baseline:  2-7/10             Goal status: INITIAL     6. Pt will report/demonstrate ability to stand/walk for up to 45 min with less than 3 pt increase in resting pain in order to promote improved community navigation and exercise tolerance for overall health/QOL.              Baseline: pain >  of standing/walking             Goal status: INITIAL      PLAN:   PT FREQUENCY: 1-2x/week (plan 2x/week for 4 weeks, taper to 1x/week subsequently)   PT DURATION: 8 weeks   PLANNED INTERVENTIONS: Therapeutic exercises, Therapeutic activity, Neuromuscular re-education, Balance training, Gait training, Patient/Family education, Self Care, Joint mobilization, Aquatic Therapy, Dry Needling, Cryotherapy, Moist heat, Taping, Manual therapy, and Re-evaluation   PLAN FOR NEXT SESSION: Progress ROM/strengthening exercises as able/appropriate, review HEP. Emphasis on comfortable quad activation, hip strengthening.    Ashley Murrain PT, DPT 11/25/2022 10:30 AM

## 2022-11-25 ENCOUNTER — Encounter: Payer: Self-pay | Admitting: Physical Therapy

## 2022-11-25 ENCOUNTER — Ambulatory Visit: Payer: Medicaid Other | Admitting: Physical Therapy

## 2022-11-25 DIAGNOSIS — M6281 Muscle weakness (generalized): Secondary | ICD-10-CM

## 2022-11-25 DIAGNOSIS — G8929 Other chronic pain: Secondary | ICD-10-CM

## 2022-11-25 DIAGNOSIS — R2689 Other abnormalities of gait and mobility: Secondary | ICD-10-CM

## 2022-11-25 DIAGNOSIS — M25561 Pain in right knee: Secondary | ICD-10-CM | POA: Diagnosis not present

## 2022-11-25 NOTE — Progress Notes (Signed)
[name], PhD, LAT, ATC acting as a scribe for Christine Graham, MD.  Christine Oneal is a 52 y.o. female who presents to [facility] at [facility] today for f/u lumbar radiculopathy. Pt was last seen by Dr. Denyse Amass 10/22/22 and was prescribed prednisone, gabapentin, and advised to give PT a month of treatment, completing 3 visits. Today, pt reports some improvement of sx with PT. PT recommended extending referral to include low back, right hip, groin, and knee pain. Radiating pain into the right groin has worsening slightly, flares with ambulation. Feel unbalanced when walking, feels like she walks with a limp on the right side. Has some irritation in the sacral/coccyx region with prolonged time lying supine.   Dx imaging: 10/22/22 L-spine XR  Pertinent review of systems: No fevers or chills.  No urinary frequency urgency or pain with urination.  Relevant historical information: Kidney stone seen right side on lumbar spine x-ray last month.   Exam:  BP 118/76   Pulse 67   Ht 5\' 3"  (1.6 m)   Wt 230 lb 9.6 oz (104.6 kg)   SpO2 100%   BMI 40.85 kg/m  General: Well Developed, well nourished, and in no acute distress.   MSK: Right hip: Normal-appearing Normal motion. Some pain with flexion and internal rotation. Strength reduced abduction and external rotation. Tender palpation greater trochanter.    Lab and Radiology Results  X-ray images right hip obtained today personally and independently interpreted Minimal right hip DJD.  No acute fractures are visible. Await formal radiology review   EXAM: LUMBAR SPINE - 2-3 VIEW   COMPARISON:  None Available.   FINDINGS: There is no evidence of lumbar spine fracture. Mild curvature of spine. Mild narrow intervertebral space, anterior spurring and facet joint sclerosis noted in the mid to lower lumbar spine. 7 mm right kidney stone is identified. Calcifications are identified within the pelvis favor pelvic  phlebolith.   IMPRESSION: 1. 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 2. Right kidney stone.     Electronically Signed   By: Sherian Rein M.D.   On: 10/25/2022 08:35 I, Christine Oneal, personally (independently) visualized and performed the interpretation of the images attached in this note.   Assessment and Plan: 52 y.o. female with chronic right low back pain with new right lateral and anterior hip pain.  She is just getting started with physical therapy for her back.  I think PT for the hip pain would likely be helpful as well.  A fair amount of her pain I think is due to hip abductor tendinopathy.  She may have a component of hip flexor tendinopathy.  The pain in the groin could be due to degenerative changes seen on x-ray.  Plan to continue physical therapy and reassess in about a month.  Could proceed with injection into the greater trochanter bursa or into the hip joint if needed.  About 7 mm kidney stone seen on lumbar spine x-ray on the right side.  I do not think that is causing her current symptoms.  We talked about the result on x-ray and after discussion we will refer to urology to further evaluate and decide if treatment is necessary including potential lithotripsy or similar. A noncontrast CT scan of the abdomen and pelvis to look at the kidney stone would also be helpful to evaluate lumbar spine and hip/pelvis bony structures more accurately.   PDMP not reviewed this encounter. Orders Placed This Encounter  Procedures  DG HIP UNILAT W OR W/O PELVIS 2-3 VIEWS RIGHT    Standing Status:   Future    Number of Occurrences:   1    Standing Expiration Date:   12/27/2022    Order Specific Question:   Reason for Exam (SYMPTOM  OR DIAGNOSIS REQUIRED)    Answer:   right hip/groin pain    Order Specific Question:   Preferred imaging location?    Answer:   [facility]    Order Specific Question:   Is patient pregnant?    Answer:   No   Ambulatory  referral to Physical Therapy    Referral Priority:   Routine    Referral Type:   Physical Medicine    Referral Reason:   Specialty Services Required    Requested Specialty:   Physical Therapy   Ambulatory referral to Urology    Referral Priority:   Routine    Referral Type:   Consultation    Referral Reason:   Specialty Services Required    Requested Specialty:   Urology    Number of Visits Requested:   1   No orders of the defined types were placed in this encounter.    Discussed warning signs or symptoms. Please see discharge instructions. Patient expresses understanding.   The above documentation has been reviewed and is accurate and complete Christine Oneal, M.D.

## 2022-11-26 ENCOUNTER — Ambulatory Visit (INDEPENDENT_AMBULATORY_CARE_PROVIDER_SITE_OTHER): Payer: Medicaid Other | Admitting: Family Medicine

## 2022-11-26 ENCOUNTER — Ambulatory Visit (INDEPENDENT_AMBULATORY_CARE_PROVIDER_SITE_OTHER): Payer: Medicaid Other

## 2022-11-26 ENCOUNTER — Encounter: Payer: Self-pay | Admitting: Family Medicine

## 2022-11-26 VITALS — BP 118/76 | HR 67 | Ht 63.0 in | Wt 230.6 lb

## 2022-11-26 DIAGNOSIS — M25551 Pain in right hip: Secondary | ICD-10-CM | POA: Diagnosis not present

## 2022-11-26 DIAGNOSIS — N200 Calculus of kidney: Secondary | ICD-10-CM

## 2022-11-26 NOTE — Patient Instructions (Addendum)
Thank you for coming in today.   Please get an Xray today before you leave   I've referred you to Urology.  Let us know if you don't hear from them in one week.   Continue going to physical therapy  Check back in 1 month

## 2022-11-30 ENCOUNTER — Ambulatory Visit: Payer: Medicaid Other | Admitting: Physical Therapy

## 2022-11-30 ENCOUNTER — Encounter: Payer: Self-pay | Admitting: Physical Therapy

## 2022-11-30 DIAGNOSIS — R2689 Other abnormalities of gait and mobility: Secondary | ICD-10-CM

## 2022-11-30 DIAGNOSIS — G8929 Other chronic pain: Secondary | ICD-10-CM

## 2022-11-30 DIAGNOSIS — M6281 Muscle weakness (generalized): Secondary | ICD-10-CM

## 2022-11-30 DIAGNOSIS — M25561 Pain in right knee: Secondary | ICD-10-CM | POA: Diagnosis not present

## 2022-11-30 NOTE — Therapy (Signed)
OUTPATIENT PHYSICAL THERAPY TREATMENT NOTE   Patient Name: Christine Oneal MRN: [id number] DOB:12/01/1970, 52 y.o., female Today's Date: 11/30/2022  PCP: Myrlene Broker, MD   REFERRING PROVIDER: Rodolph Bong, MD  END OF SESSION:   PT End of Session - 11/30/22 1023     Visit Number 4    Number of Visits 17    Date for PT Re-Evaluation 12/21/22    Authorization Type [facility]    Authorization Time Period 27 visit annual limit    PT Start Time 1021    PT Stop Time 1100    PT Time Calculation (min) 39 min              Past Medical History:  Diagnosis Date   Abnormal Pap smear of cervix    Allergy    Anemia    CHF (congestive heart failure) (HCC)    Fibroid    Hypertension    Leukocytosis, unspecified 10/18/2013   Obesity    Plantar fasciitis    Past Surgical History:  Procedure Laterality Date   ATRIAL FIBRILLATION ABLATION N/A 08/03/2022   Procedure: ATRIAL FIBRILLATION ABLATION;  Surgeon: Lanier Prude, MD;  Location: [facility] INVASIVE CV [facility];  Service: Cardiovascular;  Laterality: N/A;   CARDIOVERSION N/A 05/12/2021   Procedure: CARDIOVERSION;  Surgeon: Dolores Patty, MD;  Location: [facility] ENDOSCOPY;  Service: Cardiovascular;  Laterality: N/A;   CARDIOVERSION N/A 05/29/2021   Procedure: CARDIOVERSION;  Surgeon: Dolores Patty, MD;  Location: [facility] ENDOSCOPY;  Service: Cardiovascular;  Laterality: N/A;   COLPOSCOPY     RIGHT/LEFT HEART CATH AND CORONARY ANGIOGRAPHY N/A 05/11/2021   Procedure: RIGHT/LEFT HEART CATH AND CORONARY ANGIOGRAPHY;  Surgeon: Dolores Patty, MD;  Location: [facility] INVASIVE CV LAB;  Service: Cardiovascular;  Laterality: N/A;   TEE WITHOUT CARDIOVERSION N/A 05/12/2021   Procedure: TRANSESOPHAGEAL ECHOCARDIOGRAM (TEE);  Surgeon: Dolores Patty, MD;  Location: [facility] ENDOSCOPY;  Service: Cardiovascular;  Laterality: N/A;   TUBAL LIGATION  2001   Patient Active Problem List   Diagnosis Date Noted   Urinary incontinence 08/23/2022    Chronic pain of both knees 08/23/2022   Gait instability 08/23/2022   Dysmenorrhea 05/14/2022   Left foot pain 05/14/2022   Encounter for general adult medical examination with abnormal findings 04/17/2022   Primary hyperparathyroidism (HCC) 11/25/2021   Chronic systolic CHF (congestive heart failure) (HCC) 05/26/2021   Unspecified atrial fibrillation (HCC) 05/04/2021   Anemia 09/04/2017   Morbid obesity (HCC) 09/03/2017   Reactive airway disease 09/03/2017   Vitamin D deficiency 09/03/2017   Essential hypertension 10/24/2007   Hemorrhoids 10/24/2007    REFERRING DIAG: M25.561,M25.562,G89.29 (ICD-10-CM) - Chronic pain of both knees  THERAPY DIAG:  Chronic pain of both knees  Muscle weakness (generalized)  Other abnormalities of gait and mobility  Rationale for Evaluation and Treatment Rehabilitation  PERTINENT HISTORY: HTN, CHF, afib (pt states well controlled since ablation in January although does have some exertional SOB with activity)  PRECAUTIONS: fall hx, cardiac hx   SUBJECTIVE:  SUBJECTIVE STATEMENT:   No pain in knees today. The right groin is still the issue. It has not improved. Waiting on xray results of hip.     PAIN:  Are you having pain: 2-3/10 Location/description: right groin Best-worst over past week: 2-7/10  - aggravating factors: stair navigation, standing/walking >40min, sleeping R side (wakes up 3x/night on average), sitting >58min - Easing factors: rest, changing positions, medication, voltaren     OBJECTIVE: (objective measures completed at initial evaluation unless otherwise dated)   NEXT MD VISIT: about a month from eval per pt, cardiologist May 3rd    OBJECTIVE: *Unless otherwise noted by date, all objective measures were captured at initial evaluation.      DIAGNOSTIC FINDINGS:  Lumbar XR 10/22/22 IMPRESSION: 1. 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 2. Right kidney stone.   BIL knee XR February 2024 with degenerative changes, see EPIC for details   PATIENT SURVEYS:  LEFS eval - 43/80   COGNITION: Overall cognitive status: Within functional limits for tasks assessed                                    SENSATION: Light touch intact B LE      PALPATION: Concordant tenderness B lat quads and TFL, no pain with gentle patellar mobs in all directions, no joint line or tendon tenderness    LOWER EXTREMITY ROM:      Active  Right eval Left eval Right 11/30/22 Left 11/30/22  Hip flexion        Hip extension        Hip internal rotation        Hip external rotation        Knee extension 0 0    Knee flexion 120 deg s 110 deg painless 120 114  (Blank rows = not tested) (Key: WFL = within functional limits not formally assessed, * = concordant pain, s = stiffness/stretching sensation, NT = not tested)  Comments:    LOWER EXTREMITY MMT:     MMT Right eval Left eval  Hip flexion 4 3+  Hip abduction (modified sitting) 4 4  Hip internal rotation      Hip external rotation      Knee flexion 4+ 4+  Knee extension 4 *  4+  Ankle dorsiflexion        (Blank rows = not tested) (Key: WFL = within functional limits not formally assessed, * = concordant pain, s = stiffness/stretching sensation, NT = not tested)  Comments:     FUNCTIONAL TESTS:  5xSTS: 14.66 sec w UE support from standard chair no increase in pain   GAIT: Distance walked: within clinic Assistive device utilized: None Level of assistance: Complete Independence Comments: reduced gait speed/cadence, widened BOS, reduced trunk rotation/arm swing      TODAY'S TREATMENT:  [facility] Adult PT Treatment:                                                 DATE: 11/30/22 Therapeutic Exercise: Rec Bike L1 x 5 minutes  Bilat heel raises  Standing  hip abduction x 10 each  Standing hip ext x 10 each  Standing hamstring curls x 10 each Standing march x 10 each , alternating STS x 10  LAQ 2 x 10 each  PPT x 10 PPT to Bridge x 10 Supine clam with green band x 20 Hip flexor stretch EOM, Bilateral     [facility] Adult PT Treatment:                                                DATE: 11/25/22 Therapeutic Exercise: LAQ 2x8 BIL cues for pacing Heel/toe raises 2x10 cues for setup and form Seated march 2x8 BIL LE cues for posture Standing hip ext at counter x8 w UE support cues for upright posture  Standing hip abd at counter x8 BUE support STS x12 lowest mat gentle UE support at thighs cues for pacing  Edge of mat hip flexor stretch 3x30sec RUE Seated RTB hip abduction 3x8 cues for posture, pacing Sidestepping at counter 3 laps cues for foot positioning, pacing, and step length  Verbal HEP review/education  [facility] Adult PT Treatment:                                                DATE: 11/22/22 Therapeutic Exercise: LAQ 5 x 2 each Seated March 5 x 2 each Seated ball squeeze  x 10 PPT x 10 Bridge  x 10 SLR - 5 reps right- 1/2 rom SLR left x 10  Clam Green band supine x 15  Supine March  5 x 2  Hip flexor stretch, EOM right  3 x 30 se  Supine hamstring stretch with strap  STS x 10 Updated HEP    [facility] Adult PT Treatment:                                                DATE: 10/26/22 Therapeutic Exercise: SLR x5 each LE LAQ x5 each LE Seated heel/toe raises x10 BIL  Education for [employer], handout provided     PATIENT EDUCATION:  Education details: rationale for interventions Person educated: Patient Education method: Explanation, Demonstration, Tactile cues, Verbal cues, and Handouts Education comprehension: verbalized understanding, returned demonstration, verbal cues required, tactile cues required, and needs  further education     HOME EXERCISE PROGRAM: Access Code: [email or web address] URL: [email or web address] Date: 10/26/2022 Prepared by: Fransisco Hertz   Exercises - Seated Long Arc Quad  - 1 x daily - 7 x weekly - 2 sets - 10 reps - Seated Heel Toe Raises  - 1 x daily - 7 x weekly - 2 sets - 10 reps 11/22/22 - Seated March  - 1 x daily - 7 x weekly - 1 sets -  10 reps - 3 hold - Sit to stand with control  - 1 x daily - 7 x weekly - 1 sets - 10 reps - Modified Thomas Stretch  - 1 x daily - 7 x weekly - 1 sets - 3 reps - 20-30 hold   ASSESSMENT:   CLINICAL IMPRESSION: 11/30/2022 Pt arrives w/ 0/10 pain in knees, reports no improvement  in right groin pain with walking. MD took xray of hip, and she is waiting on the results. MD also sent new referral to add in hip pain to current HEP.   Today focusing on progression for volume with familiar program and increasing time spent in WB positions to improve closed chain tolerance. Reports some muscular fatigue/pulling with exercises but denies overt pain. No adverse events. Will plan to add new body part at next visit with Primary PT.   Eval: Pt endorses ongoing symptoms for several years but have worsened over past year, increasing difficulties with ADLs/mobility. On exam pt demos mild knee flexion deficits on L although symptoms more prominent on R, mild weakness throughout B LE. Concordant pain elicited with quad MMT. 5xSTS time at cutoff score for fall risk in most populations (12-14sec) and indicative of reduced LE strength and functional mobility. Pt tolerates session well, reports improved symptoms after HEP with education for appropriate performance. Recommend skilled PT to address relevant deficits to maximize functional tolerance.   OBJECTIVE IMPAIRMENTS: decreased activity tolerance, decreased balance, decreased endurance, decreased mobility, difficulty walking, decreased ROM, decreased strength, and pain.    ACTIVITY LIMITATIONS: carrying,  bending, sitting, standing, squatting, sleeping, stairs, transfers, and locomotion level   PARTICIPATION LIMITATIONS: meal prep, cleaning, laundry, community activity, and occupation   PERSONAL FACTORS: Time since onset of injury/illness/exacerbation and 3+ comorbidities: afib/CHF, HTN, MSK comorbidities (LBP)  are also affecting patient's functional outcome.    REHAB POTENTIAL: Good   CLINICAL DECISION MAKING: Stable/uncomplicated   EVALUATION COMPLEXITY: Low     GOALS: Goals reviewed with patient? No given time constraints   SHORT TERM GOALS: Target date: 11/23/2022   Pt will demonstrate appropriate understanding and performance of initially prescribed HEP in order to facilitate improved independence with management of symptoms.  Baseline: HEP provided on eval 11/22/22: requires cues  12/03/22: Independent with initial HEP Goal status: MET   2. Pt will score greater than or equal to 52 on LEFS in order to demonstrate improved perception of function due to symptoms.             Baseline: 43/80  11/22/22: not tested due to absence from PT             Goal status: ONGOING   LONG TERM GOALS: Target date: 12/21/2022 Pt will score 60 or greater on LEFS in order to demonstrate improved perception of function due to symptoms.  Baseline: 43/80 Goal status: INITIAL   2.  Pt will demonstrate at least 120 degrees of knee flexion ROM bilaterally in order to facilitate improved mechanics with gait/transfers. Baseline: see ROM chart above Goal status: INITIAL   3.  Pt will demonstrate appropriate performance of final prescribed HEP in order to facilitate improved self-management of symptoms post-discharge.              Baseline: initial HEP prescribed             Goal status: INITIAL     4.  Pt will be able to perform 5xSTS in less than or equal to 12sec without UE support in order  to demonstrate reduced fall risk and improved functional independence (MCID 5xSTS = 2.3 sec). Baseline:  14.66sec w UE support Goal status: INITIAL    5. Pt will report at least 50% decrease in overall pain levels in past week in order to facilitate improved tolerance to basic ADLs/mobility.              Baseline: 2-7/10             Goal status: INITIAL     6. Pt will report/demonstrate ability to stand/walk for up to 45 min with less than 3 pt increase in resting pain in order to promote improved community navigation and exercise tolerance for overall health/QOL.              Baseline: pain >40min of standing/walking             Goal status: INITIAL      PLAN:   PT FREQUENCY: 1-2x/week (plan 2x/week for 4 weeks, taper to 1x/week subsequently)   PT DURATION: 8 weeks   PLANNED INTERVENTIONS: Therapeutic exercises, Therapeutic activity, Neuromuscular re-education, Balance training, Gait training, Patient/Family education, Self Care, Joint mobilization, Aquatic Therapy, Dry Needling, Cryotherapy, Moist heat, Taping, Manual therapy, and Re-evaluation   PLAN FOR NEXT SESSION: RE-eval to add right hip- see new referral : Did she receive xray results?  Progress ROM/strengthening exercises as able/appropriate, review HEP. Emphasis on comfortable quad activation, hip strengthening.    Jannette Spanner, PTA 11/30/22 10:51 AM Phone: [phone] Fax: [phone]

## 2022-12-01 ENCOUNTER — Telehealth: Payer: Self-pay | Admitting: Internal Medicine

## 2022-12-01 NOTE — Telephone Encounter (Signed)
Patient self-scheduled for a virtual appointment that should be in person. LVM to get her appointment changed.

## 2022-12-01 NOTE — Progress Notes (Signed)
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

## 2022-12-01 NOTE — Therapy (Signed)
OUTPATIENT PHYSICAL THERAPY TREATMENT NOTE   Patient Name: Christine Oneal MRN: [id number] DOB:[birth year], 52 y.o., female Today's Date: 12/01/2022  PCP: Myrlene Broker, MD   REFERRING PROVIDER: Rodolph Bong, MD  END OF SESSION:      Past Medical History:  Diagnosis Date   Abnormal Pap smear of cervix    Allergy    Anemia    CHF (congestive heart failure) (HCC)    Fibroid    Hypertension    Leukocytosis, unspecified 10/18/2013   Obesity    Plantar fasciitis    Past Surgical History:  Procedure Laterality Date   ATRIAL FIBRILLATION ABLATION N/A 08/03/2022   Procedure: ATRIAL FIBRILLATION ABLATION;  Surgeon: Lanier Prude, MD;  Location: [facility];  Service: Cardiovascular;  Laterality: N/A;   CARDIOVERSION N/A 05/12/2021   Procedure: CARDIOVERSION;  Surgeon: Dolores Patty, MD;  Location: [facility] ENDOSCOPY;  Service: Cardiovascular;  Laterality: N/A;   CARDIOVERSION N/A 05/29/2021   Procedure: CARDIOVERSION;  Surgeon: Dolores Patty, MD;  Location: [facility] ENDOSCOPY;  Service: Cardiovascular;  Laterality: N/A;   COLPOSCOPY     RIGHT/LEFT HEART CATH AND CORONARY ANGIOGRAPHY N/A 05/11/2021   Procedure: RIGHT/LEFT HEART CATH AND CORONARY ANGIOGRAPHY;  Surgeon: Dolores Patty, MD;  Location: [facility] INVASIVE CV [facility];  Service: Cardiovascular;  Laterality: N/A;   TEE WITHOUT CARDIOVERSION N/A 05/12/2021   Procedure: TRANSESOPHAGEAL ECHOCARDIOGRAM (TEE);  Surgeon: Dolores Patty, MD;  Location: [facility] ENDOSCOPY;  Service: Cardiovascular;  Laterality: N/A;   TUBAL LIGATION  2001   Patient Active Problem List   Diagnosis Date Noted   Urinary incontinence 08/23/2022   Chronic pain of both knees 08/23/2022   Gait instability 08/23/2022   Dysmenorrhea 05/14/2022   Left foot pain 05/14/2022   Encounter for general adult medical examination with abnormal findings 04/17/2022   Primary hyperparathyroidism (HCC) 11/25/2021   Chronic systolic CHF (congestive heart  failure) (HCC) 05/26/2021   Unspecified atrial fibrillation (HCC) 05/04/2021   Anemia 09/04/2017   Morbid obesity (HCC) 09/03/2017   Reactive airway disease 09/03/2017   Vitamin D deficiency 09/03/2017   Essential hypertension 10/24/2007   Hemorrhoids 10/24/2007    REFERRING DIAG: M25.561,M25.562,G89.29 (ICD-10-CM) - Chronic pain of both knees M25.551 (ICD-10-CM) - Right hip pain (added 12/02/22)  THERAPY DIAG:  No diagnosis found.  Rationale for Evaluation and Treatment Rehabilitation  PERTINENT HISTORY: HTN, CHF, afib (pt states well controlled since ablation in January although does have some exertional SOB with activity)  PRECAUTIONS: fall hx, cardiac hx   SUBJECTIVE:  SUBJECTIVE STATEMENT:   ***  *** No pain in knees today. The right groin is still the issue. It has not improved. Waiting on xray results of hip.     PAIN:  Are you having pain: 2-3/10 Location/description: right groin Best-worst over past week: 2-7/10  - aggravating factors: stair navigation, standing/walking >25min, sleeping R side (wakes up 3x/night on average), sitting >31min - Easing factors: rest, changing positions, medication, voltaren     OBJECTIVE: (objective measures completed at initial evaluation unless otherwise dated)   NEXT MD VISIT: about a month from eval per pt, cardiologist May 3rd    OBJECTIVE: *Unless otherwise noted by date, all objective measures were captured at initial evaluation.     DIAGNOSTIC FINDINGS:  Lumbar XR 10/22/22 IMPRESSION: 1. 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 2. Right kidney stone.   BIL knee XR February 2024 with degenerative changes, see EPIC for details   PATIENT SURVEYS:  LEFS eval - 43/80   COGNITION: Overall cognitive status: Within  functional limits for tasks assessed                                    SENSATION: Light touch intact B LE      PALPATION: Concordant tenderness B lat quads and TFL, no pain with gentle patellar mobs in all directions, no joint line or tendon tenderness    LOWER EXTREMITY ROM:      Active  Right eval Left eval Right 11/30/22 Left 11/30/22  Hip flexion        Hip extension        Hip internal rotation        Hip external rotation        Knee extension 0 0    Knee flexion 120 deg s 110 deg painless 120 114  (Blank rows = not tested) (Key: WFL = within functional limits not formally assessed, * = concordant pain, s = stiffness/stretching sensation, NT = not tested)  Comments:    LOWER EXTREMITY MMT:     MMT Right eval Left eval  Hip flexion 4 3+  Hip abduction (modified sitting) 4 4  Hip internal rotation      Hip external rotation      Knee flexion 4+ 4+  Knee extension 4 *  4+  Ankle dorsiflexion        (Blank rows = not tested) (Key: WFL = within functional limits not formally assessed, * = concordant pain, s = stiffness/stretching sensation, NT = not tested)  Comments:     FUNCTIONAL TESTS:  5xSTS: 14.66 sec w UE support from standard chair no increase in pain   GAIT: Distance walked: within clinic Assistive device utilized: None Level of assistance: Complete Independence Comments: reduced gait speed/cadence, widened BOS, reduced trunk rotation/arm swing    HIP EXAM 12/02/22: ***      TODAY'S TREATMENT:  [facility] Adult PT Treatment:                                                DATE: 12/02/22 Therapeutic Exercise: *** Manual Therapy: *** Neuromuscular re-ed: *** Therapeutic Activity: *** Modalities: *** Self Care: ***   [facility] Treatment:                                                DATE: 11/30/22 Therapeutic  Exercise: Rec Bike L1 x 5 minutes  Bilat heel raises  Standing  hip abduction x 10 each  Standing hip ext x 10 each  Standing hamstring curls x 10 each Standing march x 10 each , alternating STS x 10  LAQ 2 x 10 each  PPT x 10 PPT to Bridge x 10 Supine clam with green band x 20 Hip flexor stretch EOM, Bilateral     [facility] Adult PT Treatment:                                                DATE: 11/25/22 Therapeutic Exercise: LAQ 2x8 BIL cues for pacing Heel/toe raises 2x10 cues for setup and form Seated march 2x8 BIL LE cues for posture Standing hip ext at counter x8 w UE support cues for upright posture  Standing hip abd at counter x8 BUE support STS x12 lowest mat gentle UE support at thighs cues for pacing  Edge of mat hip flexor stretch 3x30sec RUE Seated RTB hip abduction 3x8 cues for posture, pacing Sidestepping at counter 3 laps cues for foot positioning, pacing, and step length  Verbal HEP review/education  [facility] Adult PT Treatment:                                                DATE: 11/22/22 Therapeutic Exercise: LAQ 5 x 2 each Seated March 5 x 2 each Seated ball squeeze  x 10 PPT x 10 Bridge  x 10 SLR - 5 reps right- 1/2 rom SLR left x 10  Clam Green band supine x 15  Supine March  5 x 2  Hip flexor stretch, EOM right  3 x 30 se  Supine hamstring stretch with strap  STS x 10 Updated HEP    [facility] Adult PT Treatment:                                                DATE: 10/26/22 Therapeutic Exercise: SLR x5 each LE LAQ x5 each LE Seated heel/toe raises x10 BIL  Education for [employer], handout provided     PATIENT EDUCATION:  Education details: rationale for interventions Person educated: Patient Education method: Explanation, Demonstration, Tactile cues, Verbal cues, and Handouts Education comprehension: verbalized understanding, returned demonstration, verbal cues required, tactile cues required, and needs further education     HOME EXERCISE  PROGRAM: Access Code: [email or web address] URL: [email or web address] Date: 10/26/2022 Prepared by: Fransisco Hertz   Exercises - Seated Long Arc Quad  - 1 x daily - 7 x weekly - 2 sets - 10 reps - Seated Heel Toe Raises  - 1 x daily - 7 x weekly - 2 sets - 10 reps 11/22/22 - Seated March  - 1 x daily - 7 x weekly - 1 sets - 10 reps - 3 hold - Sit to stand with control  - 1 x daily - 7 x weekly - 1 sets - 10 reps - Modified Thomas Stretch  - 1 x daily - 7 x weekly - 1 sets - 3 reps - 20-30 hold   ASSESSMENT:   CLINICAL IMPRESSION: 12/01/2022 ***  *** Pt arrives w/ 0/10 pain in knees, reports no improvement  in right groin pain with walking. MD took xray of hip, and she is waiting on the results. MD also sent new referral to add in hip pain to current HEP.   Today focusing on progression for volume with familiar program and increasing time spent in WB positions to improve closed chain tolerance. Reports some muscular fatigue/pulling with exercises but denies overt pain. No adverse events. Will plan to add new body part at next visit with Primary PT.   Eval: Pt endorses ongoing symptoms for several years but have worsened over past year, increasing difficulties with ADLs/mobility. On exam pt demos mild knee flexion deficits on L although symptoms more prominent on R, mild weakness throughout B LE. Concordant pain elicited with quad MMT. 5xSTS time at cutoff score for fall risk in most populations (12-14sec) and indicative of reduced LE strength and functional mobility. Pt tolerates session well, reports improved symptoms after HEP with education for appropriate performance. Recommend skilled PT to address relevant deficits to maximize functional tolerance.   OBJECTIVE IMPAIRMENTS: decreased activity tolerance, decreased balance, decreased endurance, decreased mobility, difficulty walking, decreased ROM, decreased strength, and pain.    ACTIVITY LIMITATIONS: carrying, bending, sitting, standing,  squatting, sleeping, stairs, transfers, and locomotion level   PARTICIPATION LIMITATIONS: meal prep, cleaning, laundry, community activity, and occupation   PERSONAL FACTORS: Time since onset of injury/illness/exacerbation and 3+ comorbidities: afib/CHF, HTN, MSK comorbidities (LBP)  are also affecting patient's functional outcome.    REHAB POTENTIAL: Good   CLINICAL DECISION MAKING: Stable/uncomplicated   EVALUATION COMPLEXITY: Low     GOALS: Goals reviewed with patient? No given time constraints   SHORT TERM GOALS: Target date: 11/23/2022   Pt will demonstrate appropriate understanding and performance of initially prescribed HEP in order to facilitate improved independence with management of symptoms.  Baseline: HEP provided on eval 11/22/22: requires cues  12/03/22: Independent with initial HEP Goal status: MET   2. Pt will score greater than or equal to 52 on LEFS in order to demonstrate improved perception of function due to symptoms.             Baseline: 43/80  11/22/22: not tested due to absence from PT             Goal status: ONGOING   LONG TERM GOALS: Target date: 12/21/2022 Pt will score 60 or greater on LEFS in order to demonstrate improved perception of function due to symptoms.  Baseline: 43/80 Goal status: INITIAL   2.  Pt will demonstrate at least 120 degrees of knee flexion ROM bilaterally in order to facilitate improved mechanics with gait/transfers. Baseline: see ROM chart above Goal status: INITIAL   3.  Pt will demonstrate appropriate performance of final prescribed HEP in order to facilitate improved self-management of symptoms post-discharge.              Baseline: initial HEP prescribed             Goal status: INITIAL     4.  Pt will be able to perform 5xSTS in less than or equal to 12sec without UE support in order to demonstrate reduced fall risk and improved functional independence (MCID 5xSTS = 2.3 sec). Baseline: 14.66sec w UE support Goal status:  INITIAL    5. Pt will report at least 50% decrease in overall pain levels in past week in order to facilitate improved tolerance to basic ADLs/mobility.              Baseline: 2-7/10             Goal status: INITIAL     6. Pt will report/demonstrate ability to stand/walk for up to 45 min with less than 3 pt increase in resting pain in order to promote improved community navigation and exercise tolerance for overall health/QOL.              Baseline: pain >81min of standing/walking             Goal status: INITIAL      PLAN:   PT FREQUENCY: 1-2x/week (plan 2x/week for 4 weeks, taper to 1x/week subsequently)   PT DURATION: 8 weeks   PLANNED INTERVENTIONS: Therapeutic exercises, Therapeutic activity, Neuromuscular re-education, Balance training, Gait training, Patient/Family education, Self Care, Joint mobilization, Aquatic Therapy, Dry Needling, Cryotherapy, Moist heat, Taping, Manual therapy, and Re-evaluation   PLAN FOR NEXT SESSION: RE-eval to add right hip- see new referral : Did she receive xray results?  Progress ROM/strengthening exercises as able/appropriate, review HEP. Emphasis on comfortable quad activation, hip strengthening. ***    Ashley Murrain PT, DPT 12/01/2022 9:18 AM

## 2022-12-02 ENCOUNTER — Ambulatory Visit: Payer: Medicaid Other | Admitting: Physical Therapy

## 2022-12-02 ENCOUNTER — Encounter: Payer: Self-pay | Admitting: Physical Therapy

## 2022-12-02 DIAGNOSIS — M6281 Muscle weakness (generalized): Secondary | ICD-10-CM

## 2022-12-02 DIAGNOSIS — M25551 Pain in right hip: Secondary | ICD-10-CM

## 2022-12-02 DIAGNOSIS — R2689 Other abnormalities of gait and mobility: Secondary | ICD-10-CM

## 2022-12-02 DIAGNOSIS — M25561 Pain in right knee: Secondary | ICD-10-CM | POA: Diagnosis not present

## 2022-12-02 DIAGNOSIS — G8929 Other chronic pain: Secondary | ICD-10-CM

## 2022-12-03 ENCOUNTER — Ambulatory Visit: Payer: Medicaid Other | Admitting: Internal Medicine

## 2022-12-08 ENCOUNTER — Ambulatory Visit (INDEPENDENT_AMBULATORY_CARE_PROVIDER_SITE_OTHER): Payer: Medicaid Other | Admitting: Internal Medicine

## 2022-12-08 ENCOUNTER — Ambulatory Visit: Payer: Commercial Managed Care - HMO

## 2022-12-08 ENCOUNTER — Encounter: Payer: Self-pay | Admitting: Internal Medicine

## 2022-12-08 VITALS — BP 100/80 | HR 78 | Temp 98.3°F | Ht 63.0 in | Wt 231.0 lb

## 2022-12-08 DIAGNOSIS — Z23 Encounter for immunization: Secondary | ICD-10-CM

## 2022-12-08 DIAGNOSIS — M79671 Pain in right foot: Secondary | ICD-10-CM | POA: Diagnosis not present

## 2022-12-08 MED ORDER — LIDOCAINE 5 % EX PTCH: 1.0000 | MEDICATED_PATCH | CUTANEOUS | 0 refills | Status: AC

## 2022-12-08 NOTE — Patient Instructions (Signed)
We have sent in lidocaine patches to use on the foot for pain. Let us know if physical therapy does not help.

## 2022-12-08 NOTE — Progress Notes (Unsigned)
   Subjective:   Patient ID: Christine Oneal, female    DOB: 1971-02-24, 52 y.o.   MRN: [id number]  HPI The patient is a 52 YO female coming in for foot pain with walking.  Review of Systems  Objective:  Physical Exam  Vitals:   12/08/22 1049  BP: 100/80  Pulse: 78  Temp: 98.3 F (36.8 C)  TempSrc: Oral  SpO2: 98%  Weight: 231 lb (104.8 kg)  Height: 5\' 3"  (1.6 m)    Assessment & Plan:  Shingrix IM given at visit

## 2022-12-09 ENCOUNTER — Other Ambulatory Visit: Payer: Self-pay

## 2022-12-09 DIAGNOSIS — M79671 Pain in right foot: Secondary | ICD-10-CM | POA: Insufficient documentation

## 2022-12-09 MED ORDER — APIXABAN 5 MG PO TABS: 5.0000 mg | ORAL_TABLET | Freq: Two times a day (BID) | ORAL | 6 refills | Status: DC

## 2022-12-09 NOTE — Telephone Encounter (Signed)
Prescription refill request for Eliquis received. Indication:AFIB Last office visit:5/24 Scr:1.30 Age: 52 Weight:104.8  KG  PRESCRIPTION REFILLED

## 2022-12-09 NOTE — Assessment & Plan Note (Signed)
Suspect related to back and hip problems causing impacted gait. Advised her to use lidocaine patches 5% (rx done) for pain and keep working with PT. If no improvement return to sports medicine for assessment and possible injection.

## 2022-12-14 ENCOUNTER — Other Ambulatory Visit (HOSPITAL_COMMUNITY): Payer: Self-pay

## 2022-12-14 ENCOUNTER — Ambulatory Visit: Payer: Self-pay | Admitting: Surgery

## 2022-12-14 ENCOUNTER — Ambulatory Visit: Payer: Medicaid Other | Attending: Family Medicine | Admitting: Physical Therapy

## 2022-12-14 ENCOUNTER — Encounter: Payer: Self-pay | Admitting: Physical Therapy

## 2022-12-14 DIAGNOSIS — M25551 Pain in right hip: Secondary | ICD-10-CM | POA: Diagnosis not present

## 2022-12-14 DIAGNOSIS — E213 Hyperparathyroidism, unspecified: Secondary | ICD-10-CM | POA: Diagnosis not present

## 2022-12-14 DIAGNOSIS — M25561 Pain in right knee: Secondary | ICD-10-CM | POA: Insufficient documentation

## 2022-12-14 DIAGNOSIS — M25562 Pain in left knee: Secondary | ICD-10-CM | POA: Insufficient documentation

## 2022-12-14 DIAGNOSIS — G8929 Other chronic pain: Secondary | ICD-10-CM | POA: Diagnosis not present

## 2022-12-14 DIAGNOSIS — R2689 Other abnormalities of gait and mobility: Secondary | ICD-10-CM | POA: Insufficient documentation

## 2022-12-14 DIAGNOSIS — M6281 Muscle weakness (generalized): Secondary | ICD-10-CM | POA: Diagnosis not present

## 2022-12-14 MED ORDER — LOSARTAN POTASSIUM 50 MG PO TABS: 50.0000 mg | ORAL_TABLET | Freq: Two times a day (BID) | ORAL | 5 refills | Status: DC

## 2022-12-14 NOTE — Patient Instructions (Signed)

## 2022-12-14 NOTE — Therapy (Signed)
OUTPATIENT PHYSICAL THERAPY RE-EVALUATION/RECERTIFICATION   Patient Name: Christine Oneal MRN: [id number] DOB:1971-03-15, 52 y.o., female Today's Date: 12/14/2022  PCP: Myrlene Broker, MD   REFERRING PROVIDER: Rodolph Bong, MD  END OF SESSION:   PT End of Session - 12/14/22 1020     Visit Number 6    Number of Visits 17    Date for PT Re-Evaluation 01/13/23    Authorization Type [employer]    Authorization Time Period 27 visit annual limit    PT Start Time 1019    PT Stop Time 1100    PT Time Calculation (min) 41 min    Activity Tolerance Patient tolerated treatment well    Behavior During Therapy WFL for tasks assessed/performed               Past Medical History:  Diagnosis Date   Abnormal Pap smear of cervix    Allergy    Anemia    CHF (congestive heart failure) (HCC)    Fibroid    Hypertension    Leukocytosis, unspecified 10/18/2013   Obesity    Plantar fasciitis    Past Surgical History:  Procedure Laterality Date   ATRIAL FIBRILLATION ABLATION N/A 08/03/2022   Procedure: ATRIAL FIBRILLATION ABLATION;  Surgeon: Lanier Prude, MD;  Location: [facility] IN[facility]ASIVE [facility];  Service: Cardiovascular;  Laterality: N/A;   CARDIOVERSION N/A 05/12/2021   Procedure: CARDIOVERSION;  Surgeon: Dolores Patty, MD;  Location: [facility] ENDOSCOPY;  Service: Cardiovascular;  Laterality: N/A;   CARDIOVERSION N/A 05/29/2021   Procedure: CARDIOVERSION;  Surgeon: Dolores Patty, MD;  Location: [facility] ENDOSCOPY;  Service: Cardiovascular;  Laterality: N/A;   COLPOSCOPY     RIGHT/LEFT HEART CATH AND CORONARY ANGIOGRAPHY N/A 05/11/2021   Procedure: RIGHT/LEFT HEART CATH AND CORONARY ANGIOGRAPHY;  Surgeon: Dolores Patty, MD;  Location: [facility] INVASIVE CV LAB;  Service: Cardiovascular;  Laterality: N/A;   TEE WITHOUT CARDIOVERSION N/A 05/12/2021   Procedure: TRANSESOPHAGEAL ECHOCARDIOGRAM (TEE);  Surgeon: Dolores Patty, MD;  Location: [facility] ENDOSCOPY;  Service: Cardiovascular;   Laterality: N/A;   TUBAL LIGATION  2001   Patient Active Problem List   Diagnosis Date Noted   Right foot pain 12/09/2022   Urinary incontinence 08/23/2022   Chronic pain of both knees 08/23/2022   Gait instability 08/23/2022   Dysmenorrhea 05/14/2022   Left foot pain 05/14/2022   Encounter for general adult medical examination with abnormal findings 04/17/2022   Primary hyperparathyroidism (HCC) 11/25/2021   Chronic systolic CHF (congestive heart failure) (HCC) 05/26/2021   Unspecified atrial fibrillation (HCC) 05/04/2021   Anemia 09/04/2017   Morbid obesity (HCC) 09/03/2017   Reactive airway disease 09/03/2017   Vitamin D deficiency 09/03/2017   Essential hypertension 10/24/2007   Hemorrhoids 10/24/2007    REFERRING DIAG: M25.561,M25.562,G89.29 (ICD-10-CM) - Chronic pain of both knees M25.551 (ICD-10-CM) - Right hip pain (added 12/02/22)  THERAPY DIAG:  Chronic pain of both knees  Muscle weakness (generalized)  Other abnormalities of gait and mobility  Pain in right hip  Rationale for Evaluation and Treatment Rehabilitation  PERTINENT HISTORY: HTN, CHF, afib (pt states well controlled since ablation in January although does have some exertional SOB with activity)  PRECAUTIONS: fall hx, cardiac hx   SUBJECTIVE:  SUBJECTIVE STATEMENT:   States knees continue to do well.  Knees ~1/10 at present, no groin pain sitting, 3/10 for palpation on R lateral leg. Pt is interested in TPDN.     PAIN:  Are you having pain: 2-3/10 Location/description: right groin Worst: hip 5-6/10, knees 1-2/10  Per eval - Best-worst over past week: 2-7/10  - aggravating factors: stair navigation, standing/walking >7min, sleeping R side (wakes up 3x/night on average), sitting >72min - Easing factors: rest,  changing positions, medication, voltaren     OBJECTIVE: (objective measures completed at initial evaluation unless otherwise dated)   NEXT MD VISIT: about a month from eval per pt, cardiologist May 3rd    OBJECTIVE: *Unless otherwise noted by date, all objective measures were captured at initial evaluation.     DIAGNOSTIC FINDINGS:  Lumbar XR 10/22/22 IMPRESSION: 1. 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 2. Right kidney stone.   BIL knee XR February 2024 with degenerative changes, see EPIC for details  Hip/pelvis XR 11/26/22  IMPRESSION: Degenerative changes lower lumbar spine and both hips. No acute bony or joint abnormality.   PATIENT SURVEYS:  LEFS eval - 43/80 LEFS 12/02/22: 34/80    COGNITION: Overall cognitive status: Within functional limits for tasks assessed                                    SENSATION: Light touch intact B LE      PALPATION: Concordant tenderness B lat quads and TFL, no pain with gentle patellar mobs in all directions, no joint line or tendon tenderness    LOWER EXTREMITY ROM:      Active  Right eval Left eval Right 11/30/22 Left 11/30/22  Hip flexion        Hip extension        Hip internal rotation        Hip external rotation        Knee extension 0 0    Knee flexion 120 deg s 110 deg painless 120 114  (Blank rows = not tested) (Key: WFL = within functional limits not formally assessed, * = concordant pain, s = stiffness/stretching sensation, NT = not tested)  Comments:    LOWER EXTREMITY MMT:     MMT Right eval Left eval R/L 12/02/22  Hip flexion 4 3+ 4/4+  Hip abduction (modified sitting) 4 4 5/5  Hip internal rotation     3/3+  Hip external rotation     3+/4  Knee flexion 4+ 4+   Knee extension 4 *  4+   Ankle dorsiflexion         (Blank rows = not tested) (Key: WFL = within functional limits not formally assessed, * = concordant pain, s = stiffness/stretching sensation, NT = not tested)  Comments:      FUNCTIONAL TESTS:  5xSTS: 14.66 sec w UE support from standard chair no increase in pain 12/02/22 5xSTS: 12.28sec gentle UE support at thighs, hip pain   GAIT: Distance walked: within clinic Assistive device utilized: None Level of assistance: Complete Independence Comments: reduced gait speed/cadence, widened BOS, reduced trunk rotation/arm swing    HIP EXAM 12/02/22: LLE negative FABER, FADDIR, distraction/compression RLE positive FADDIR for groin pain, FABER and compression irritate SI, distraction unrelieving       TODAY'S TREATMENT:  [facility] Adult PT Treatment:  DATE: 12-14-22 Therapeutic Exercise: GTB ER w ball as fulcrum 3x10 cues for form and setup  GTB IR w ball as fulcrum 3 x 10 cues for form and setup STS with 10 lb x 10 STS with 15 lb AMRAP for 14 with fatigue Quad stretch with hand and with towel assist and added to HEP Step ups 6 inch step 2 x 10 R and L each Gastroc stretch on R and L 2 x 30 sec each Review HEP Manual Therapy: STW to R Vastus Lateralis  in Left sidelying Trigger Point Dry-Needling performed     by Garen Lah Treatment instructions: Expect mild to moderate muscle soreness. S/S of pneumothorax if dry needled over a lung field, and to seek immediate medical attention should they occur. Patient verbalized understanding of these instructions and education.  Patient Consent Given: Yes Education handout provided: Previously provided Muscles treated: R vastus Lateralis Electrical stimulation performed: No Parameters: N/A Treatment response/outcome: twitch response noted, pt noted relief                                                                                                                    [facility] Adult PT Treatment:                                                DATE: 12/02/22 RE-EVALUATION for new referral Therapeutic Exercise: RTB ER w ball as fulcrum 3x8 cues for form and setup  RTB IR w  ball as fulcrum 2x8 cues for form and setup  Therapeutic Activity: MSK assessment + education LEFS + education  5xSTS + education Education/discussion re: symptom behavior, activity modification, rationale for interventions, PT POC and pt goals     PATIENT EDUCATION:  Education details: rationale for interventions Person educated: Patient Education method: Explanation, Demonstration, Tactile cues, Verbal cues, and Handouts Education comprehension: verbalized understanding, returned demonstration, verbal cues required, tactile cues required, and needs further education     HOME EXERCISE PROGRAM: Access Code: [email or web address] URL: [email or web address] Date: 10/26/2022 Prepared by: Fransisco Hertz   Exercises - Seated Long Arc Quad  - 1 x daily - 7 x weekly - 2 sets - 10 reps - Seated Heel Toe Raises  - 1 x daily - 7 x weekly - 2 sets - 10 reps 11/22/22 - Seated March  - 1 x daily - 7 x weekly - 1 sets - 10 reps - 3 hold - Sit to stand with control  - 1 x daily - 7 x weekly - 1 sets - 10 reps - Modified Thomas Stretch  - 1 x daily - 7 x weekly - 1 sets - 3 reps - 20-30 hold  12-14-22 - Standing Quad Stretch with Towel and Arm Support  - 1 x daily - 7 x weekly - 1 sets - 2-3 reps - 30 sec hold - Forward Step Up  -  1 x daily - 7 x weekly - 3 sets - 10 reps  ASSESSMENT:   CLINICAL IMPRESSION: 12/14/2022 Ms Nevils presents  w/ mild R knee pain BIL, 3/10  vastus lateralis R with palpation ; Ms Dellis consents to  [facility] and has good relief of sharp pain and able to progress exercise with quad set and steps ups today.  TPDN produced marked twitch response and noted decreased tissue tension post TPDN.  Added to HEP and review HEP  No adverse events. No adverse effects to TPDN and HEP. Will continue POC.  Eval: Pt endorses ongoing symptoms for several years but have worsened over past year, increasing difficulties with ADLs/mobility. On exam pt demos mild knee flexion deficits on L although  symptoms more prominent on R, mild weakness throughout B LE. Concordant pain elicited with quad MMT. 5xSTS time at cutoff score for fall risk in most populations (12-14sec) and indicative of reduced LE strength and functional mobility. Pt tolerates session well, reports improved symptoms after HEP with education for appropriate performance. Recommend skilled PT to address relevant deficits to maximize functional tolerance.   OBJECTIVE IMPAIRMENTS: decreased activity tolerance, decreased balance, decreased endurance, decreased mobility, difficulty walking, decreased ROM, decreased strength, and pain.    ACTIVITY LIMITATIONS: carrying, bending, sitting, standing, squatting, sleeping, stairs, transfers, and locomotion level   PARTICIPATION LIMITATIONS: meal prep, cleaning, laundry, community activity, and occupation   PERSONAL FACTORS: Time since onset of injury/illness/exacerbation and 3+ comorbidities: afib/CHF, HTN, MSK comorbidities (LBP)  are also affecting patient's functional outcome.    REHAB POTENTIAL: Good   CLINICAL DECISION MAKING: Stable/uncomplicated   EVALUATION COMPLEXITY: Low     GOALS: Goals reviewed with patient? No given time constraints   SHORT TERM GOALS: Target date: 11/23/2022   Pt will demonstrate appropriate understanding and performance of initially prescribed HEP in order to facilitate improved independence with management of symptoms.  Baseline: HEP provided on eval 11/22/22: requires cues  12/03/22: Independent with initial HEP Goal status: MET   2. Pt will score greater than or equal to 52 on LEFS in order to demonstrate improved perception of function due to symptoms.             Baseline: 43/80  11/22/22: not tested due to absence from PT  12/02/22: 34/80             Goal status: ONGOING   LONG TERM GOALS: Target date: 01/13/2023  (Updated 12/02/22 re eval) Pt will score 60 or greater on LEFS in order to demonstrate improved perception of function due to  symptoms.  Baseline: 43/80 12/02/22: 34/80  Goal status: ONGOING   2.  Pt will demonstrate at least 120 degrees of knee flexion ROM bilaterally in order to facilitate improved mechanics with gait/transfers. Baseline: see ROM chart above 12/02/22: NT given hip re-eval  Goal status: ONGOING   3.  Pt will demonstrate appropriate performance of final prescribed HEP in order to facilitate improved self-management of symptoms post-discharge.              Baseline: initial HEP prescribed  12/02/22: good HEP adherence reported             Goal status: ONGOING     4.  Pt will be able to perform 5xSTS in less than or equal to 12sec without UE support in order to demonstrate reduced fall risk and improved functional independence (MCID 5xSTS = 2.3 sec). Baseline: 14.66sec w UE support 12/02/22: 12sec w gentle UE support Goal status:  PARTIALLY MET   5. Pt will report at least 50% decrease in overall pain levels in past week in order to facilitate improved tolerance to basic ADLs/mobility.              Baseline: 2-7/10  12/02/22: 5-6/10 hip at worst, 1-2/10 knees at worst             Goal status: PROGRESSING   6. Pt will report/demonstrate ability to stand/walk for up to 45 min with less than 3 pt increase in resting pain in order to promote improved community navigation and exercise tolerance for overall health/QOL.              Baseline: pain >43min of standing/walking  12/02/22: pain >94min in hip/groin, knees no longer limiting walking most of the time             Goal status: ONGOING  7. Pt will demo R hip ER/IR MMT of at least 4/5 in order to demonstrate improved functional strength.  Baseline: see MMT chart above  Goal status: NEW     PLAN: updated 12/02/22   PT FREQUENCY:  2x/week    PT DURATION: 6 weeks   PLANNED INTERVENTIONS: Therapeutic exercises, Therapeutic activity, Neuromuscular re-education, Balance training, Gait training, Patient/Family education, Self Care, Joint mobilization,  Aquatic Therapy, Dry Needling, Cryotherapy, Moist heat, Taping, Manual therapy, and Re-evaluation   PLAN FOR NEXT SESSION: review/update HEP PRN. Quad activation BIL, rotational hip stability, gradual progression to more functional strengthening   Garen Lah, PT, [facility] Certified Exercise Expert for the Aging Adult  12/14/22 11:08 AM Phone: [phone] Fax: [phone]

## 2022-12-15 ENCOUNTER — Telehealth: Payer: Self-pay | Admitting: *Deleted

## 2022-12-15 NOTE — Telephone Encounter (Signed)
Please advise holding Eliquis prior to parathyroid surgery.   Thank you!  DW

## 2022-12-15 NOTE — Telephone Encounter (Signed)
   Pre-operative Risk Assessment    Patient Name: Christine Oneal  DOB: 01-09-1971 MRN: [id number]      Request for Surgical Clearance    Procedure:   Parathyroid Surgery  Date of Surgery:  Clearance TBD                                 Surgeon:  Dr. Darnell Level Surgeon's Group or Practice Name:  [facility] Phone number:  [phone] Fax number:  [phone]   Type of Clearance Requested:   - Medical  - Pharmacy:  Hold Apixaban (Eliquis) Not indicated.    Type of Anesthesia:  General    Additional requests/questions:    Signed, Emmit Pomfret   12/15/2022, 8:22 AM

## 2022-12-15 NOTE — Therapy (Signed)
OUTPATIENT PHYSICAL THERAPY RE-EVALUATION/RECERTIFICATION   Patient Name: Christine Oneal MRN: [id number] DOB:[birth year], 52 y.o., female Today's Date: 12/16/2022  PCP: Myrlene Broker, MD   REFERRING PROVIDER: Rodolph Bong, MD  END OF SESSION:   PT End of Session - 12/16/22 1014     Visit Number 7    Number of Visits 17    Date for PT Re-Evaluation 01/13/23    Authorization Type [employer]    Authorization Time Period 27 visit annual limit    PT Start Time 1015    PT Stop Time 1100    PT Time Calculation (min) 45 min    Activity Tolerance Patient tolerated treatment well    Behavior During Therapy WFL for tasks assessed/performed                Past Medical History:  Diagnosis Date   Abnormal Pap smear of cervix    Allergy    Anemia    CHF (congestive heart failure) (HCC)    Fibroid    Hypertension    Leukocytosis, unspecified 10/18/2013   Obesity    Plantar fasciitis    Past Surgical History:  Procedure Laterality Date   ATRIAL FIBRILLATION ABLATION N/A 08/03/2022   Procedure: ATRIAL FIBRILLATION ABLATION;  Surgeon: Lanier Prude, MD;  Location: [facility] INVASIVE CV LAB;  Service: Cardiovascular;  Laterality: N/A;   CARDIOVERSION N/A 05/12/2021   Procedure: CARDIOVERSION;  Surgeon: Dolores Patty, MD;  Location: [facility] ENDOSCOPY;  Service: Cardiovascular;  Laterality: N/A;   CARDIOVERSION N/A 05/29/2021   Procedure: CARDIOVERSION;  Surgeon: Dolores Patty, MD;  Location: [facility] ENDOSCOPY;  Service: Cardiovascular;  Laterality: N/A;   COLPOSCOPY     RIGHT/LEFT HEART CATH AND CORONARY ANGIOGRAPHY N/A 05/11/2021   Procedure: RIGHT/LEFT HEART CATH AND CORONARY ANGIOGRAPHY;  Surgeon: Dolores Patty, MD;  Location: [facility] INVASIVE CV LAB;  Service: Cardiovascular;  Laterality: N/A;   TEE WITHOUT CARDIOVERSION N/A 05/12/2021   Procedure: TRANSESOPHAGEAL ECHOCARDIOGRAM (TEE);  Surgeon: Dolores Patty, MD;  Location: [facility] ENDOSCOPY;  Service: Cardiovascular;   Laterality: N/A;   TUBAL LIGATION  2001   Patient Active Problem List   Diagnosis Date Noted   Right foot pain 12/09/2022   Urinary incontinence 08/23/2022   Chronic pain of both knees 08/23/2022   Gait instability 08/23/2022   Dysmenorrhea 05/14/2022   Left foot pain 05/14/2022   Encounter for general adult medical examination with abnormal findings 04/17/2022   Primary hyperparathyroidism (HCC) 11/25/2021   Chronic systolic CHF (congestive heart failure) (HCC) 05/26/2021   Unspecified atrial fibrillation (HCC) 05/04/2021   Anemia 09/04/2017   Morbid obesity (HCC) 09/03/2017   Reactive airway disease 09/03/2017   Vitamin D deficiency 09/03/2017   Essential hypertension 10/24/2007   Hemorrhoids 10/24/2007    REFERRING DIAG: M25.561,M25.562,G89.29 (ICD-10-CM) - Chronic pain of both knees M25.551 (ICD-10-CM) - Right hip pain (added 12/02/22)  THERAPY DIAG:  Chronic pain of both knees  Muscle weakness (generalized)  Other abnormalities of gait and mobility  Pain in right hip  Rationale for Evaluation and Treatment Rehabilitation  PERTINENT HISTORY: HTN, CHF, afib (pt states well controlled since ablation in January although does have some exertional SOB with activity)  PRECAUTIONS: fall hx, cardiac hx   SUBJECTIVE:  SUBJECTIVE STATEMENT:   I still have that nagging pain 1/10 in my side of my R thigh.and in my butt  I want to dry needle   I struggle to get in and out of the tub.     PAIN:  Are you having pain: 2-3/10 Location/description: right groin Worst: hip 5-6/10, knees 1-2/10  Per eval - Best-worst over past week: 2-7/10  - aggravating factors: stair navigation, standing/walking >67min, sleeping R side (wakes up 3x/night on average), sitting >39min - Easing factors: rest, changing  positions, medication, voltaren     OBJECTIVE: (objective measures completed at initial evaluation unless otherwise dated)   NEXT MD VISIT: about a month from eval per pt, cardiologist May 3rd    OBJECTIVE: *Unless otherwise noted by date, all objective measures were captured at initial evaluation.     DIAGNOSTIC FINDINGS:  Lumbar XR 10/22/22 IMPRESSION: 1. 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 2. Right kidney stone.   BIL knee XR February 2024 with degenerative changes, see EPIC for details  Hip/pelvis XR 11/26/22  IMPRESSION: Degenerative changes lower lumbar spine and both hips. No acute bony or joint abnormality.   PATIENT SURVEYS:  LEFS eval - 43/80 LEFS 12/02/22: 34/80    COGNITION: Overall cognitive status: Within functional limits for tasks assessed                                    SENSATION: Light touch intact B LE      PALPATION: Concordant tenderness B lat quads and TFL, no pain with gentle patellar mobs in all directions, no joint line or tendon tenderness    LOWER EXTREMITY ROM:      Active  Right eval Left eval Right 11/30/22 Left 11/30/22 R/L 12-16-22  Hip flexion         Hip extension         Hip internal rotation         Hip external rotation         Knee extension 0 0     Knee flexion 120 deg s 110 deg painless 120 114 120/120  (Blank rows = not tested) (Key: WFL = within functional limits not formally assessed, * = concordant pain, s = stiffness/stretching sensation, NT = not tested)  Comments:    LOWER EXTREMITY MMT:     MMT Right eval Left eval R/L 12/02/22  Hip flexion 4 3+ 4/4+  Hip abduction (modified sitting) 4 4 5/5  Hip internal rotation     3/3+  Hip external rotation     3+/4  Knee flexion 4+ 4+   Knee extension 4 *  4+   Ankle dorsiflexion         (Blank rows = not tested) (Key: WFL = within functional limits not formally assessed, * = concordant pain, s = stiffness/stretching sensation, NT = not  tested)  Comments:     FUNCTIONAL TESTS:  5xSTS: 14.66 sec w UE support from standard chair no increase in pain 12/02/22 5xSTS: 12.28sec gentle UE support at thighs, hip pain   GAIT: Distance walked: within clinic Assistive device utilized: None Level of assistance: Complete Independence Comments: reduced gait speed/cadence, widened BOS, reduced trunk rotation/arm swing    HIP EXAM 12/02/22: LLE negative FABER, FADDIR, distraction/compression RLE positive FADDIR for groin pain, FABER and compression irritate SI, distraction unrelieving       TODAY'S TREATMENT: [facility]  Adult PT Treatment:                                                DATE: 12-16-22 Therapeutic Exercise: GTB ER w ball as fulcrum 3x10 cues for form and setup  GTB IR w ball as fulcrum 3 x 10 cues for form and setup STS with 15 lb x 10 Standing quad stretch with UE on wall  Manual Therapy:  STW over R lateral thigh and R Quad ER/IR overpressure of R hip Trigger Point Dry-Needling performed     by Garen Lah Treatment instructions: Expect mild to moderate muscle soreness. S/S of pneumothorax if dry needled over a lung field, and to seek immediate medical attention should they occur. Patient verbalized understanding of these instructions and education.  Patient Consent Given: Yes Education handout provided: Previously provided Muscles treated: R Vastus Lateralis and R piriformis Electrical stimulation performed: No Parameters: N/A Treatment response/outcome: twitch response noted, pt noted relief  Therapeutic Activity: Initial education and re turn demo of standing to 1/2 kneeling to tall kneeling and back up with UE support on mat x 3  [facility] Adult PT Treatment:                                                DATE: 12-14-22 Therapeutic Exercise: GTB ER w ball as fulcrum 3x10 cues for form and setup  GTB IR w ball as fulcrum 3 x 10 cues for form and setup STS with 10 lb x 10 STS with 15 lb AMRAP for 14 with  fatigue Quad stretch with hand and with towel assist and added to HEP Step ups 6 inch step 2 x 10 R and L each Gastroc stretch on R and L 2 x 30 sec each Review HEP Manual Therapy: STW to R Vastus Lateralis  in Left sidelying Trigger Point Dry-Needling performed     by Garen Lah Treatment instructions: Expect mild to moderate muscle soreness. S/S of pneumothorax if dry needled over a lung field, and to seek immediate medical attention should they occur. Patient verbalized understanding of these instructions and education.  Patient Consent Given: Yes Education handout provided: Previously provided Muscles treated: R vastus Lateralis Electrical stimulation performed: No Parameters: N/A Treatment response/outcome: twitch response noted, pt noted relief                                                                                                                    [facility] Adult PT Treatment:  DATE: 12/02/22 RE-EVALUATION for new referral Therapeutic Exercise: RTB ER w ball as fulcrum 3x8 cues for form and setup  RTB IR w ball as fulcrum 2x8 cues for form and setup  Therapeutic Activity: MSK assessment + education LEFS + education  5xSTS + education Education/discussion re: symptom behavior, activity modification, rationale for interventions, PT POC and pt goals     PATIENT EDUCATION:  Education details: rationale for interventions Person educated: Patient Education method: Explanation, Demonstration, Tactile cues, Verbal cues, and Handouts Education comprehension: verbalized understanding, returned demonstration, verbal cues required, tactile cues required, and needs further education     HOME EXERCISE PROGRAM: Access Code: [email or web address] URL: [email or web address] Date: 10/26/2022 Prepared by: Fransisco Hertz   Exercises - Seated Long Arc Quad  - 1 x daily - 7 x weekly - 2 sets - 10 reps - Seated Heel Toe Raises  - 1 x  daily - 7 x weekly - 2 sets - 10 reps 11/22/22 - Seated March  - 1 x daily - 7 x weekly - 1 sets - 10 reps - 3 hold - Sit to stand with control  - 1 x daily - 7 x weekly - 1 sets - 10 reps - Modified Thomas Stretch  - 1 x daily - 7 x weekly - 1 sets - 3 reps - 20-30 hold  12-14-22 - Standing Quad Stretch with Towel and Arm Support  - 1 x daily - 7 x weekly - 1 sets - 2-3 reps - 30 sec hold - Forward Step Up  - 1 x daily - 7 x weekly - 3 sets - 10 reps  ASSESSMENT:   CLINICAL IMPRESSION: 12/16/2022 Ms Wolaver presents  0/10 pain in knee but 1/10  vastus lateralis R and R piriformis and 3/10  with palpation ; Ms Mawson consents to  [facility] and has good relief of sharp pain and able to progress exercise with standing to floor transfer. Knee AROM 120 bil and achieved goal.  Pt pain is decreasing and function improving with beginning transfers for stand to floor with need to increase to maximize strength and fall preparedness.  TPDN produced marked twitch response and noted decreased tissue tension post TPDN.  Added to HEP and review HEP  No adverse events. No adverse effects to TPDN  Eval: Pt endorses ongoing symptoms for several years but have worsened over past year, increasing difficulties with ADLs/mobility. On exam pt demos mild knee flexion deficits on L although symptoms more prominent on R, mild weakness throughout B LE. Concordant pain elicited with quad MMT. 5xSTS time at cutoff score for fall risk in most populations (12-14sec) and indicative of reduced LE strength and functional mobility. Pt tolerates session well, reports improved symptoms after HEP with education for appropriate performance. Recommend skilled PT to address relevant deficits to maximize functional tolerance.   OBJECTIVE IMPAIRMENTS: decreased activity tolerance, decreased balance, decreased endurance, decreased mobility, difficulty walking, decreased ROM, decreased strength, and pain.    ACTIVITY LIMITATIONS: carrying, bending,  sitting, standing, squatting, sleeping, stairs, transfers, and locomotion level   PARTICIPATION LIMITATIONS: meal prep, cleaning, laundry, community activity, and occupation   PERSONAL FACTORS: Time since onset of injury/illness/exacerbation and 3+ comorbidities: afib/CHF, HTN, MSK comorbidities (LBP)  are also affecting patient's functional outcome.    REHAB POTENTIAL: Good   CLINICAL DECISION MAKING: Stable/uncomplicated   EVALUATION COMPLEXITY: Low     GOALS: Goals reviewed with patient? No given time constraints   SHORT TERM GOALS: Target date: 11/23/2022   Pt will  demonstrate appropriate understanding and performance of initially prescribed HEP in order to facilitate improved independence with management of symptoms.  Baseline: HEP provided on eval 11/22/22: requires cues  12/03/22: Independent with initial HEP Goal status: MET   2. Pt will score greater than or equal to 52 on LEFS in order to demonstrate improved perception of function due to symptoms.             Baseline: 43/80  11/22/22: not tested due to absence from PT  12/02/22: 34/80             Goal status: ONGOING   LONG TERM GOALS: Target date: 01/13/2023  (Updated 12/02/22 re eval) Pt will score 60 or  greater  on LEFS in order to demonstrate improved perception of function due to symptoms.  Baseline: 43/80 12/02/22: 34/80  Goal status:ONGOING   2.  Pt will demonstrate at least 120 degrees of knee flexion ROM bilaterally in order to facilitate improved mechanics with gait/transfers. Baseline: see ROM chart above 12/02/22: NT given hip re-eval  12-16-22 See chart R 120, L 120 Goal status: met   3.  Pt will demonstrate appropriate performance of final prescribed HEP in order to facilitate improved self-management of symptoms post-discharge.              Baseline: initial HEP prescribed  12/02/22: good HEP adherence reported             Goal status: ONGOING     4.  Pt will be able to perform 5xSTS in less than or  equal to 12sec without UE support in order to demonstrate reduced fall risk and improved functional independence (MCID 5xSTS = 2.3 sec). Baseline: 14.66sec w UE support 12/02/22: 12sec w gentle UE support 12-16-22  Goal status: PARTIALLY MET   5. Pt will report at least 50% decrease in overall pain levels in past week in order to facilitate improved tolerance to basic ADLs/mobility.              Baseline: 2-7/10  12/02/22: 5-6/10 hip at worst, 1-2/10 knees at worst  12-16-22  1/10 today and just a specific pain on lateral thigh             Goal status: PROGRESSING   6. Pt will report/demonstrate ability to stand/walk for up to 45 min with less than 3 pt increase in resting pain in order to promote improved community navigation and exercise tolerance for overall health/QOL.              Baseline: pain >10min of standing/walking  12/02/22: pain >45min in hip/groin, knees no longer limiting walking most of the time             Goal status: ONGOING  7. Pt will demo R hip ER/IR MMT of at least 4/5 in order to demonstrate improved functional strength.  Baseline: see MMT chart above  Goal status: NEW     PLAN: updated 12/02/22   PT FREQUENCY:  2x/week    PT DURATION: 6 weeks   PLANNED INTERVENTIONS: Therapeutic exercises, Therapeutic activity, Neuromuscular re-education, Balance training, Gait training, Patient/Family education, Self Care, Joint mobilization, Aquatic Therapy, Dry Needling, Cryotherapy, Moist heat, Taping, Manual therapy, and Re-evaluation   PLAN FOR NEXT SESSION: review/update HEP PRN. Quad activation BIL, rotational hip stability, gradual progression to more functional strengthening  Garen Lah, PT, ATRIC Certified Exercise Expert for the Aging Adult  12/16/22 12:09 PM Phone: [phone] Fax: [phone]

## 2022-12-15 NOTE — Telephone Encounter (Signed)
Patient with diagnosis of afib on Eliquis for anticoagulation.    Procedure: parathyroid surgery Date of procedure: TBD  CHA2DS2-VASc Score = 3  This indicates a 3.2% annual risk of stroke. The patient's score is based upon: CHF History: 1 HTN History: 1 Diabetes History: 0 Stroke History: 0 Vascular Disease History: 0 Age Score: 0 Gender Score: 1  Afib ablation 08/03/22.  CrCl 70mL/min using adj body weight Platelet count 346K  Per office protocol, patient can hold Eliquis for 2-3 days prior to procedure.    **This guidance is not considered finalized until pre-operative APP has relayed final recommendations.**

## 2022-12-16 ENCOUNTER — Encounter: Payer: Self-pay | Admitting: Physical Therapy

## 2022-12-16 ENCOUNTER — Ambulatory Visit: Payer: Medicaid Other | Admitting: Physical Therapy

## 2022-12-16 DIAGNOSIS — M25561 Pain in right knee: Secondary | ICD-10-CM | POA: Diagnosis not present

## 2022-12-16 DIAGNOSIS — G8929 Other chronic pain: Secondary | ICD-10-CM | POA: Diagnosis not present

## 2022-12-16 DIAGNOSIS — R2689 Other abnormalities of gait and mobility: Secondary | ICD-10-CM

## 2022-12-16 DIAGNOSIS — M25551 Pain in right hip: Secondary | ICD-10-CM

## 2022-12-16 DIAGNOSIS — M6281 Muscle weakness (generalized): Secondary | ICD-10-CM | POA: Diagnosis not present

## 2022-12-16 DIAGNOSIS — M25562 Pain in left knee: Secondary | ICD-10-CM | POA: Diagnosis not present

## 2022-12-16 NOTE — Patient Instructions (Signed)

## 2022-12-18 ENCOUNTER — Other Ambulatory Visit (HOSPITAL_COMMUNITY): Payer: Self-pay

## 2022-12-21 ENCOUNTER — Ambulatory Visit: Payer: Medicaid Other | Admitting: Physical Therapy

## 2022-12-24 ENCOUNTER — Encounter: Payer: Self-pay | Admitting: Physical Therapy

## 2022-12-26 NOTE — Progress Notes (Unsigned)
   [name], PhD, LAT, ATC acting as a scribe for [name], MD.  KYMBERLY RINEHART is a 52 y.o. female who presents to [facility] today for f/u R hip and low back pain. Pt was last seen by Dr. Denyse Amass on 11/26/22 and was advised to cont PT, as previously referred for her LBP, completing 7 visits. Incidentally she was also referred to urology for a 7mm kidney zone revealed on L-spine XR.  Today, pt reports ***  Dx imaging: 11/26/22 R hip XR 10/22/22 L-spine XR   Pertinent review of systems: ***  Relevant historical information: ***   Exam:  There were no vitals taken for this visit. General: Well Developed, well nourished, and in no acute distress.   MSK: ***    Lab and Radiology Results No results found for this or any previous visit (from the past 72 hour(s)). No results found.     Assessment and Plan: 52 y.o. female with ***   PDMP not reviewed this encounter. No orders of the defined types were placed in this encounter.  No orders of the defined types were placed in this encounter.    Discussed warning signs or symptoms. Please see discharge instructions. Patient expresses understanding.   ***

## 2022-12-27 ENCOUNTER — Encounter: Payer: Self-pay | Admitting: Family Medicine

## 2022-12-27 ENCOUNTER — Ambulatory Visit (INDEPENDENT_AMBULATORY_CARE_PROVIDER_SITE_OTHER): Payer: Medicaid Other | Admitting: Family Medicine

## 2022-12-27 VITALS — BP 118/82 | HR 57 | Ht 63.0 in | Wt 229.6 lb

## 2022-12-27 DIAGNOSIS — M79672 Pain in left foot: Secondary | ICD-10-CM

## 2022-12-27 DIAGNOSIS — M25551 Pain in right hip: Secondary | ICD-10-CM | POA: Diagnosis not present

## 2022-12-27 NOTE — Therapy (Signed)
OUTPATIENT PHYSICAL THERAPY TREATMENT NOTE   Patient Name: Christine Oneal MRN: [id number] DOB:Nov 05, 1970, 52 y.o., female Today's Date: 12/28/2022  PCP: Myrlene Broker, MD   REFERRING PROVIDER: Rodolph Bong, MD  END OF SESSION:   PT End of Session - 12/28/22 0924     Visit Number 8    Number of Visits 17    Date for PT Re-Evaluation 01/13/23    Authorization Type [facility]    Authorization Time Period 27 visit annual limit    PT Start Time 0925    PT Stop Time 1004    PT Time Calculation (min) 39 min    Activity Tolerance Patient tolerated treatment well    Behavior During Therapy [facility] for tasks assessed/performed                 Past Medical History:  Diagnosis Date   Abnormal Pap smear of cervix    Allergy    Anemia    CHF (congestive heart failure) (HCC)    Fibroid    Hypertension    Leukocytosis, unspecified 10/18/2013   Obesity    Plantar fasciitis    Past Surgical History:  Procedure Laterality Date   ATRIAL FIBRILLATION ABLATION N/A 08/03/2022   Procedure: ATRIAL FIBRILLATION ABLATION;  Surgeon: Lanier Prude, MD;  Location: [facility] INVASIVE CV LAB;  Service: Cardiovascular;  Laterality: N/A;   CARDIOVERSION N/A 05/12/2021   Procedure: CARDIOVERSION;  Surgeon: Dolores Patty, MD;  Location: [facility] ENDOSCOPY;  Service: Cardiovascular;  Laterality: N/A;   CARDIOVERSION N/A 05/29/2021   Procedure: CARDIOVERSION;  Surgeon: Dolores Patty, MD;  Location: [facility] ENDOSCOPY;  Service: Cardiovascular;  Laterality: N/A;   COLPOSCOPY     RIGHT/LEFT HEART CATH AND CORONARY ANGIOGRAPHY N/A 05/11/2021   Procedure: RIGHT/LEFT HEART CATH AND CORONARY ANGIOGRAPHY;  Surgeon: Dolores Patty, MD;  Location: [facility] INVASIVE CV LAB;  Service: Cardiovascular;  Laterality: N/A;   TEE WITHOUT CARDIOVERSION N/A 05/12/2021   Procedure: TRANSESOPHAGEAL ECHOCARDIOGRAM (TEE);  Surgeon: Dolores Patty, MD;  Location: [facility] ENDOSCOPY;  Service: Cardiovascular;  Laterality:  N/A;   TUBAL LIGATION  2001   Patient Active Problem List   Diagnosis Date Noted   Right foot pain 12/09/2022   Urinary incontinence 08/23/2022   Chronic pain of both knees 08/23/2022   Gait instability 08/23/2022   Dysmenorrhea 05/14/2022   Left foot pain 05/14/2022   Encounter for general adult medical examination with abnormal findings 04/17/2022   Primary hyperparathyroidism (HCC) 11/25/2021   Chronic systolic CHF (congestive heart failure) (HCC) 05/26/2021   Unspecified atrial fibrillation (HCC) 05/04/2021   Anemia 09/04/2017   Morbid obesity (HCC) 09/03/2017   Reactive airway disease 09/03/2017   Vitamin D deficiency 09/03/2017   Essential hypertension 10/24/2007   Hemorrhoids 10/24/2007    REFERRING DIAG: M25.561,M25.562,G89.29 (ICD-10-CM) - Chronic pain of both knees M25.551 (ICD-10-CM) - Right hip pain (added 12/02/22)  THERAPY DIAG:  Chronic pain of both knees  Muscle weakness (generalized)  Other abnormalities of gait and mobility  Pain in right hip  Rationale for Evaluation and Treatment Rehabilitation  PERTINENT HISTORY: HTN, CHF, afib (pt states well controlled since ablation in January although does have some exertional SOB with activity)  PRECAUTIONS: fall hx, cardiac hx   SUBJECTIVE:  SUBJECTIVE STATEMENT:   Pt states she is still having a bit of pain, felt better after last session and felt needling was helpful. Pt states knees are feeling good, hip still feels "a little pressure" when walking. States she wants to work more on stairs and floor transfers     PAIN:  Are you having pain: 1/10 R hip Location/description: right lateral hip  Worst: hip 5-6/10, knees 1-2/10  Per eval - Best-worst over past week: 2-7/10  - aggravating factors: stair navigation,  standing/walking >73min, sleeping R side (wakes up 3x/night on average), sitting >42min - Easing factors: rest, changing positions, medication, voltaren     OBJECTIVE: (objective measures completed at initial evaluation unless otherwise dated)   NEXT MD VISIT: about a month from eval per pt, cardiologist May 3rd    OBJECTIVE: *Unless otherwise noted by date, all objective measures were captured at initial evaluation.     DIAGNOSTIC FINDINGS:  Lumbar XR 10/22/22 IMPRESSION: 1. 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 2. Right kidney stone.   BIL knee XR February 2024 with degenerative changes, see EPIC for details  Hip/pelvis XR 11/26/22  IMPRESSION: Degenerative changes lower lumbar spine and both hips. No acute bony or joint abnormality.   PATIENT SURVEYS:  LEFS eval - 43/80 LEFS 12/02/22: 34/80    COGNITION: Overall cognitive status: Within functional limits for tasks assessed                                    SENSATION: Light touch intact B LE      PALPATION: Concordant tenderness B lat quads and TFL, no pain with gentle patellar mobs in all directions, no joint line or tendon tenderness    LOWER EXTREMITY ROM:      Active  Right eval Left eval Right 11/30/22 Left 11/30/22 R/L 12-16-22  Hip flexion         Hip extension         Hip internal rotation         Hip external rotation         Knee extension 0 0     Knee flexion 120 deg s 110 deg painless 120 114 120/120  (Blank rows = not tested) (Key: WFL = within functional limits not formally assessed, * = concordant pain, s = stiffness/stretching sensation, NT = not tested)  Comments:    LOWER EXTREMITY MMT:     MMT Right eval Left eval R/L 12/02/22  Hip flexion 4 3+ 4/4+  Hip abduction (modified sitting) 4 4 5/5  Hip internal rotation     3/3+  Hip external rotation     3+/4  Knee flexion 4+ 4+   Knee extension 4 *  4+   Ankle dorsiflexion         (Blank rows = not tested) (Key:  WFL = within functional limits not formally assessed, * = concordant pain, s = stiffness/stretching sensation, NT = not tested)  Comments:     FUNCTIONAL TESTS:  5xSTS: 14.66 sec w UE support from standard chair no increase in pain 12/02/22 5xSTS: 12.28sec gentle UE support at thighs, hip pain   GAIT: Distance walked: within clinic Assistive device utilized: None Level of assistance: Complete Independence Comments: reduced gait speed/cadence, widened BOS, reduced trunk rotation/arm swing    HIP EXAM 12/02/22: LLE negative FABER, FADDIR, distraction/compression RLE positive FADDIR for groin pain, FABER and  compression irritate SI, distraction unrelieving       TODAY'S TREATMENT: [facility] Adult PT Treatment:                                                DATE: 12/28/22 Therapeutic Exercise: GTB IR with ball as fulcrum, 2x15 cues for pacing GTB ER with ball as fulcrum 2x15 cues for pacing 10# STS x10, 15# x8, 10# x10 cues for form and pacing Standing hip flexor mini lunge position x10 cues for comfortable ROM  Education on relevant anatomy/physiology as it pertains to exercise in session, HEP, and general exercise outside of sessions   Therapeutic Activity: Time spent w/ education/discussion re: functional mobility, floor transfers, stair navigation, and strategies to progress exercises towards these goals as appropriate Fwd step ups 6 inch at counter x5 BIL, 4inch x6 BIL cues for pacing and control (particularly eccentric control on RLE)     [facility] Adult PT Treatment:                                                DATE: 12-16-22 Therapeutic Exercise: GTB ER w ball as fulcrum 3x10 cues for form and setup  GTB IR w ball as fulcrum 3 x 10 cues for form and setup STS with 15 lb x 10 Standing quad stretch with UE on wall  Manual Therapy:  STW over R lateral thigh and R Quad ER/IR overpressure of R hip Trigger Point Dry-Needling performed     by Garen Lah Treatment instructions:  Expect mild to moderate muscle soreness. S/S of pneumothorax if dry needled over a lung field, and to seek immediate medical attention should they occur. Patient verbalized understanding of these instructions and education.  Patient Consent Given: Yes Education handout provided: Previously provided Muscles treated: R Vastus Lateralis and R piriformis Electrical stimulation performed: No Parameters: N/A Treatment response/outcome: twitch response noted, pt noted relief  Therapeutic Activity: Initial education and re turn demo of standing to 1/2 kneeling to tall kneeling and back up with UE support on mat x 3  [facility] Adult PT Treatment:                                                DATE: 12-14-22 Therapeutic Exercise: GTB ER w ball as fulcrum 3x10 cues for form and setup  GTB IR w ball as fulcrum 3 x 10 cues for form and setup STS with 10 lb x 10 STS with 15 lb AMRAP for 14 with fatigue Quad stretch with hand and with towel assist and added to HEP Step ups 6 inch step 2 x 10 R and L each Gastroc stretch on R and L 2 x 30 sec each Review HEP Manual Therapy: STW to R Vastus Lateralis  in Left sidelying Trigger Point Dry-Needling performed     by Garen Lah Treatment instructions: Expect mild to moderate muscle soreness. S/S of pneumothorax if dry needled over a lung field, and to seek immediate medical attention should they occur. Patient verbalized understanding of these instructions and education.  Patient Consent Given: Yes Education handout provided: Previously  provided Muscles treated: R vastus Lateralis Electrical stimulation performed: No Parameters: N/A Treatment response/outcome: twitch response noted, pt noted relief                                                                                                                    [facility] Adult PT Treatment:                                                DATE: 12/02/22 RE-EVALUATION for new referral Therapeutic Exercise: RTB  ER w ball as fulcrum 3x8 cues for form and setup  RTB IR w ball as fulcrum 2x8 cues for form and setup  Therapeutic Activity: MSK assessment + education LEFS + education  5xSTS + education Education/discussion re: symptom behavior, activity modification, rationale for interventions, PT POC and pt goals     PATIENT EDUCATION:  Education details: rationale for interventions, relevant anatomy/physiology Person educated: Patient Education method: Explanation, Demonstration, Tactile cues, Verbal cues, and Handouts Education comprehension: verbalized understanding, returned demonstration, verbal cues required, tactile cues required, and needs further education     HOME EXERCISE PROGRAM: Access Code: [email or web address] URL: [email or web address] Date: 10/26/2022 Prepared by: Fransisco Hertz   Exercises - Seated Long Arc Quad  - 1 x daily - 7 x weekly - 2 sets - 10 reps - Seated Heel Toe Raises  - 1 x daily - 7 x weekly - 2 sets - 10 reps 11/22/22 - Seated March  - 1 x daily - 7 x weekly - 1 sets - 10 reps - 3 hold - Sit to stand with control  - 1 x daily - 7 x weekly - 1 sets - 10 reps - Modified Thomas Stretch  - 1 x daily - 7 x weekly - 1 sets - 3 reps - 20-30 hold  12-14-22 - Standing Quad Stretch with Towel and Arm Support  - 1 x daily - 7 x weekly - 1 sets - 2-3 reps - 30 sec hold - Forward Step Up  - 1 x daily - 7 x weekly - 3 sets - 10 reps  ASSESSMENT:   CLINICAL IMPRESSION: 12/28/2022 Pt arrives w/o knee pain, 1/10 lateral hip pain described as "pressure". Pt continues to endorse progress and relief from dry needling. Today focusing on improving hip rotational strength/endurance with progression of familiar program and additional closed chain work. Pt tolerates well with cues as above, some mild irritability with step ups although this improves with rest. Departs with report of 2/10 pain in R hip. Recommend continuing along current POC in order to address relevant deficits and  improve functional tolerance. Pt departs today's session in no acute distress, all voiced questions/concerns addressed appropriately from PT perspective.     Eval: Pt endorses ongoing symptoms for several years but have worsened over past year, increasing difficulties with ADLs/mobility. On exam pt demos mild knee flexion  deficits on L although symptoms more prominent on R, mild weakness throughout B LE. Concordant pain elicited with quad MMT. 5xSTS time at cutoff score for fall risk in most populations (12-14sec) and indicative of reduced LE strength and functional mobility. Pt tolerates session well, reports improved symptoms after HEP with education for appropriate performance. Recommend skilled PT to address relevant deficits to maximize functional tolerance.   OBJECTIVE IMPAIRMENTS: decreased activity tolerance, decreased balance, decreased endurance, decreased mobility, difficulty walking, decreased ROM, decreased strength, and pain.    ACTIVITY LIMITATIONS: carrying, bending, sitting, standing, squatting, sleeping, stairs, transfers, and locomotion level   PARTICIPATION LIMITATIONS: meal prep, cleaning, laundry, community activity, and occupation   PERSONAL FACTORS: Time since onset of injury/illness/exacerbation and 3+ comorbidities: afib/CHF, HTN, MSK comorbidities (LBP)  are also affecting patient's functional outcome.    REHAB POTENTIAL: Good   CLINICAL DECISION MAKING: Stable/uncomplicated   EVALUATION COMPLEXITY: Low     GOALS: Goals reviewed with patient? No given time constraints   SHORT TERM GOALS: Target date: 11/23/2022   Pt will demonstrate appropriate understanding and performance of initially prescribed HEP in order to facilitate improved independence with management of symptoms.  Baseline: HEP provided on eval 11/22/22: requires cues  12/03/22: Independent with initial HEP Goal status: MET   2. Pt will score greater than or equal to 52 on LEFS in order to demonstrate  improved perception of function due to symptoms.             Baseline: 43/80  11/22/22: not tested due to absence from PT  12/02/22: 34/80             Goal status: ONGOING   LONG TERM GOALS: Target date: 01/13/2023  (Updated 12/02/22 re eval) Pt will score 60 or  greater  on LEFS in order to demonstrate improved perception of function due to symptoms.  Baseline: 43/80 12/02/22: 34/80  Goal status:ONGOING   2.  Pt will demonstrate at least 120 degrees of knee flexion ROM bilaterally in order to facilitate improved mechanics with gait/transfers. Baseline: see ROM chart above 12/02/22: NT given hip re-eval  12-16-22 See chart R 120, L 120 Goal status: met   3.  Pt will demonstrate appropriate performance of final prescribed HEP in order to facilitate improved self-management of symptoms post-discharge.              Baseline: initial HEP prescribed  12/02/22: good HEP adherence reported             Goal status: ONGOING     4.  Pt will be able to perform 5xSTS in less than or equal to 12sec without UE support in order to demonstrate reduced fall risk and improved functional independence (MCID 5xSTS = 2.3 sec). Baseline: 14.66sec w UE support 12/02/22: 12sec w gentle UE support 12-16-22  Goal status: PARTIALLY MET   5. Pt will report at least 50% decrease in overall pain levels in past week in order to facilitate improved tolerance to basic ADLs/mobility.              Baseline: 2-7/10  12/02/22: 5-6/10 hip at worst, 1-2/10 knees at worst  12-16-22  1/10 today and just a specific pain on lateral thigh             Goal status: PROGRESSING   6. Pt will report/demonstrate ability to stand/walk for up to 45 min with less than 3 pt increase in resting pain in order to promote improved community navigation and exercise tolerance  for overall health/QOL.              Baseline: pain >37min of standing/walking  12/02/22: pain >33min in hip/groin, knees no longer limiting walking most of the time             Goal  status: ONGOING  7. Pt will demo R hip ER/IR MMT of at least 4/5 in order to demonstrate improved functional strength.  Baseline: see MMT chart above  Goal status: NEW     PLAN: updated 12/02/22   PT FREQUENCY:  2x/week    PT DURATION: 6 weeks   PLANNED INTERVENTIONS: Therapeutic exercises, Therapeutic activity, Neuromuscular re-education, Balance training, Gait training, Patient/Family education, Self Care, Joint mobilization, Aquatic Therapy, Dry Needling, Cryotherapy, Moist heat, Taping, Manual therapy, and Re-evaluation   PLAN FOR NEXT SESSION: review/update HEP PRN. Quad activation BIL, rotational hip stability, gradual progression to more functional strengthening. Pt wants to work towards floor transfers and more stairs  Ashley Murrain PT, DPT 12/28/2022 10:10 AM

## 2022-12-27 NOTE — Patient Instructions (Signed)
Continue home exercises for your Physical Therapist for a month or so and let us know if your symptoms don't improve. Can consider inj if sx don't improve.   Look in to arch support for your shoes.

## 2022-12-28 ENCOUNTER — Encounter: Payer: Self-pay | Admitting: Physical Therapy

## 2022-12-28 ENCOUNTER — Ambulatory Visit: Payer: Medicaid Other | Admitting: Physical Therapy

## 2022-12-28 DIAGNOSIS — M6281 Muscle weakness (generalized): Secondary | ICD-10-CM | POA: Diagnosis not present

## 2022-12-28 DIAGNOSIS — M25561 Pain in right knee: Secondary | ICD-10-CM | POA: Diagnosis not present

## 2022-12-28 DIAGNOSIS — M25562 Pain in left knee: Secondary | ICD-10-CM | POA: Diagnosis not present

## 2022-12-28 DIAGNOSIS — G8929 Other chronic pain: Secondary | ICD-10-CM

## 2022-12-28 DIAGNOSIS — R2689 Other abnormalities of gait and mobility: Secondary | ICD-10-CM | POA: Diagnosis not present

## 2022-12-28 DIAGNOSIS — M25551 Pain in right hip: Secondary | ICD-10-CM | POA: Diagnosis not present

## 2022-12-29 DIAGNOSIS — N200 Calculus of kidney: Secondary | ICD-10-CM | POA: Diagnosis not present

## 2022-12-30 ENCOUNTER — Ambulatory Visit: Payer: Medicaid Other | Admitting: Physical Therapy

## 2022-12-30 ENCOUNTER — Encounter: Payer: Self-pay | Admitting: Physical Therapy

## 2022-12-30 DIAGNOSIS — R2689 Other abnormalities of gait and mobility: Secondary | ICD-10-CM | POA: Diagnosis not present

## 2022-12-30 DIAGNOSIS — M25551 Pain in right hip: Secondary | ICD-10-CM | POA: Diagnosis not present

## 2022-12-30 DIAGNOSIS — M25561 Pain in right knee: Secondary | ICD-10-CM | POA: Diagnosis not present

## 2022-12-30 DIAGNOSIS — G8929 Other chronic pain: Secondary | ICD-10-CM

## 2022-12-30 DIAGNOSIS — M25562 Pain in left knee: Secondary | ICD-10-CM | POA: Diagnosis not present

## 2022-12-30 DIAGNOSIS — M6281 Muscle weakness (generalized): Secondary | ICD-10-CM

## 2022-12-30 NOTE — Therapy (Signed)
OUTPATIENT PHYSICAL THERAPY TREATMENT NOTE   Patient Name: Christine Oneal MRN: [id number] DOB:08/29/1970, 52 y.o., female Today's Date: 12/30/2022  PCP: Myrlene Broker, MD   REFERRING PROVIDER: Rodolph Bong, MD  END OF SESSION:   PT End of Session - 12/30/22 0941     Visit Number 9    Number of Visits 17    Date for PT Re-Evaluation 01/13/23    Authorization Type [facility]    Authorization Time Period 27 visit annual limit    PT Start Time 0942    PT Stop Time 1023    PT Time Calculation (min) 41 min                 Past Medical History:  Diagnosis Date   Abnormal Pap smear of cervix    Allergy    Anemia    CHF (congestive heart failure) (HCC)    Fibroid    Hypertension    Leukocytosis, unspecified 10/18/2013   Obesity    Plantar fasciitis    Past Surgical History:  Procedure Laterality Date   ATRIAL FIBRILLATION ABLATION N/A 08/03/2022   Procedure: ATRIAL FIBRILLATION ABLATION;  Surgeon: Lanier Prude, MD;  Location: [facility] INVASIVE CV LAB;  Service: Cardiovascular;  Laterality: N/A;   CARDIOVERSION N/A 05/12/2021   Procedure: CARDIOVERSION;  Surgeon: Dolores Patty, MD;  Location: [facility] ENDOSCOPY;  Service: Cardiovascular;  Laterality: N/A;   CARDIOVERSION N/A 05/29/2021   Procedure: CARDIOVERSION;  Surgeon: Dolores Patty, MD;  Location: [facility] ENDOSCOPY;  Service: Cardiovascular;  Laterality: N/A;   COLPOSCOPY     RIGHT/LEFT HEART CATH AND CORONARY ANGIOGRAPHY N/A 05/11/2021   Procedure: RIGHT/LEFT HEART CATH AND CORONARY ANGIOGRAPHY;  Surgeon: Dolores Patty, MD;  Location: [facility] INVASIVE CV LAB;  Service: Cardiovascular;  Laterality: N/A;   TEE WITHOUT CARDIOVERSION N/A 05/12/2021   Procedure: TRANSESOPHAGEAL ECHOCARDIOGRAM (TEE);  Surgeon: Dolores Patty, MD;  Location: [facility] ENDOSCOPY;  Service: Cardiovascular;  Laterality: N/A;   TUBAL LIGATION  2001   Patient Active Problem List   Diagnosis Date Noted   Right foot pain 12/09/2022    Urinary incontinence 08/23/2022   Chronic pain of both knees 08/23/2022   Gait instability 08/23/2022   Dysmenorrhea 05/14/2022   Left foot pain 05/14/2022   Encounter for general adult medical examination with abnormal findings 04/17/2022   Primary hyperparathyroidism (HCC) 11/25/2021   Chronic systolic CHF (congestive heart failure) (HCC) 05/26/2021   Unspecified atrial fibrillation (HCC) 05/04/2021   Anemia 09/04/2017   Morbid obesity (HCC) 09/03/2017   Reactive airway disease 09/03/2017   Vitamin D deficiency 09/03/2017   Essential hypertension 10/24/2007   Hemorrhoids 10/24/2007    REFERRING DIAG: M25.561,M25.562,G89.29 (ICD-10-CM) - Chronic pain of both knees M25.551 (ICD-10-CM) - Right hip pain (added 12/02/22)  THERAPY DIAG:  Chronic pain of both knees  Muscle weakness (generalized)  Rationale for Evaluation and Treatment Rehabilitation  PERTINENT HISTORY: HTN, CHF, afib (pt states well controlled since ablation in January although does have some exertional SOB with activity)  PRECAUTIONS: fall hx, cardiac hx   SUBJECTIVE:  SUBJECTIVE STATEMENT:   Pt reports only intermittent knee pain with bouts of "arthritis pain" that come with cold weather and at night. She reports right hip is most bothersome and wants to work on strength of RLE.      PAIN:  Are you having pain: 1-2/10 R hip, 0/10 knee pain now ( can reach 5-6/10 with cold weather or randomly).  Location/description: right lateral hip  Worst: hip 5/10, knees 1-2/10  Per eval - Best-worst over past week: 2-7/10  - aggravating factors: stair navigation, standing/walking >63min, sleeping R side (wakes up 3x/night on average), sitting >52min - Easing factors: rest, changing positions, medication, voltaren     OBJECTIVE: (objective  measures completed at initial evaluation unless otherwise dated)   NEXT MD VISIT: about a month from eval per pt, cardiologist May 3rd    OBJECTIVE: *Unless otherwise noted by date, all objective measures were captured at initial evaluation.     DIAGNOSTIC FINDINGS:  Lumbar XR 10/22/22 IMPRESSION: 1. 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 2. Right kidney stone.   BIL knee XR February 2024 with degenerative changes, see EPIC for details  Hip/pelvis XR 11/26/22  IMPRESSION: Degenerative changes lower lumbar spine and both hips. No acute bony or joint abnormality.   PATIENT SURVEYS:  LEFS eval - 43/80 LEFS 12/02/22: 34/80 LEFS: 12/30/22: 43/80    COGNITION: Overall cognitive status: Within functional limits for tasks assessed                                    SENSATION: Light touch intact B LE      PALPATION: Concordant tenderness B lat quads and TFL, no pain with gentle patellar mobs in all directions, no joint line or tendon tenderness    LOWER EXTREMITY ROM:      Active  Right eval Left eval Right 11/30/22 Left 11/30/22 R/L 12-16-22  Hip flexion         Hip extension         Hip internal rotation         Hip external rotation         Knee extension 0 0     Knee flexion 120 deg s 110 deg painless 120 114 120/120  (Blank rows = not tested) (Key: WFL = within functional limits not formally assessed, * = concordant pain, s = stiffness/stretching sensation, NT = not tested)  Comments:    LOWER EXTREMITY MMT:     MMT Right eval Left eval R/L 12/02/22  Hip flexion 4 3+ 4/4+  Hip abduction (modified sitting) 4 4 5/5  Hip internal rotation     3/3+  Hip external rotation     3+/4  Knee flexion 4+ 4+   Knee extension 4 *  4+   Ankle dorsiflexion         (Blank rows = not tested) (Key: WFL = within functional limits not formally assessed, * = concordant pain, s = stiffness/stretching sensation, NT = not tested)  Comments:     FUNCTIONAL  TESTS:  5xSTS: 14.66 sec w UE support from standard chair no increase in pain 12/02/22 5xSTS: 12.28 sec gentle UE support at thighs, hip pain   GAIT: Distance walked: within clinic Assistive device utilized: None Level of assistance: Complete Independence Comments: reduced gait speed/cadence, widened BOS, reduced trunk rotation/arm swing    HIP EXAM 12/02/22: LLE negative FABER, FADDIR, distraction/compression  RLE positive FADDIR for groin pain, FABER and compression irritate SI, distraction unrelieving       TODAY'S TREATMENT: [facility] Adult PT Treatment:                                                DATE: 12/30/22 Therapeutic Exercise: Nustep L5 LE only x 5 minutes 15# STS AMRAP x15 reps 6 inch step up 1 UE x 10 each  Tandem on AIREX 45 sec + x 2 SLS on AIREX 4-8 sec x 2 each  Gastroc slant board  S/L right hip clam x 10, reverse clam x 10 Supine clam green band x10 Banded bridge x 10  Ball squeeze with bridge x 10 Standing quad stretch with UE at counter  LEFS    [facility] Adult PT Treatment:                                                DATE: 12/28/22 Therapeutic Exercise: GTB IR with ball as fulcrum, 2x15 cues for pacing GTB ER with ball as fulcrum 2x15 cues for pacing 10# STS x10, 15# x8, 10# x10 cues for form and pacing Standing hip flexor mini lunge position x10 cues for comfortable ROM  Education on relevant anatomy/physiology as it pertains to exercise in session, HEP, and general exercise outside of sessions   Therapeutic Activity: Time spent w/ education/discussion re: functional mobility, floor transfers, stair navigation, and strategies to progress exercises towards these goals as appropriate Fwd step ups 6 inch at counter x5 BIL, 4inch x6 BIL cues for pacing and control (particularly eccentric control on RLE)     [facility] Adult PT Treatment:                                                DATE: 12-16-22 Therapeutic Exercise: GTB ER w ball as fulcrum 3x10 cues for form  and setup  GTB IR w ball as fulcrum 3 x 10 cues for form and setup STS with 15 lb x 10 Standing quad stretch with UE on wall  Manual Therapy:  STW over R lateral thigh and R Quad ER/IR overpressure of R hip Trigger Point Dry-Needling performed     by Garen Lah Treatment instructions: Expect mild to moderate muscle soreness. S/S of pneumothorax if dry needled over a lung field, and to seek immediate medical attention should they occur. Patient verbalized understanding of these instructions and education.  Patient Consent Given: Yes Education handout provided: Previously provided Muscles treated: R Vastus Lateralis and R piriformis Electrical stimulation performed: No Parameters: N/A Treatment response/outcome: twitch response noted, pt noted relief  Therapeutic Activity: Initial education and re turn demo of standing to 1/2 kneeling to tall kneeling and back up with UE support on mat x 3  [facility] Adult PT Treatment:                                                DATE: 12-14-22 Therapeutic Exercise: GTB  ER w ball as fulcrum 3x10 cues for form and setup  GTB IR w ball as fulcrum 3 x 10 cues for form and setup STS with 10 lb x 10 STS with 15 lb AMRAP for 14 with fatigue Quad stretch with hand and with towel assist and added to HEP Step ups 6 inch step 2 x 10 R and L each Gastroc stretch on R and L 2 x 30 sec each Review HEP Manual Therapy: STW to R Vastus Lateralis  in Left sidelying Trigger Point Dry-Needling performed     by Garen Lah Treatment instructions: Expect mild to moderate muscle soreness. S/S of pneumothorax if dry needled over a lung field, and to seek immediate medical attention should they occur. Patient verbalized understanding of these instructions and education.  Patient Consent Given: Yes Education handout provided: Previously provided Muscles treated: R vastus Lateralis Electrical stimulation performed: No Parameters: N/A Treatment response/outcome:  twitch response noted, pt noted relief                                                                                                                    [facility] Adult PT Treatment:                                                DATE: 12/02/22 RE-EVALUATION for new referral Therapeutic Exercise: RTB ER w ball as fulcrum 3x8 cues for form and setup  RTB IR w ball as fulcrum 2x8 cues for form and setup  Therapeutic Activity: MSK assessment + education LEFS + education  5xSTS + education Education/discussion re: symptom behavior, activity modification, rationale for interventions, PT POC and pt goals     PATIENT EDUCATION:  Education details: rationale for interventions, relevant anatomy/physiology Person educated: Patient Education method: Explanation, Demonstration, Tactile cues, Verbal cues, and Handouts Education comprehension: verbalized understanding, returned demonstration, verbal cues required, tactile cues required, and needs further education     HOME EXERCISE PROGRAM: Access Code: [email or web address] URL: [email or web address] Date: 10/26/2022 Prepared by: Fransisco Hertz   Exercises - Seated Long Arc Quad  - 1 x daily - 7 x weekly - 2 sets - 10 reps - Seated Heel Toe Raises  - 1 x daily - 7 x weekly - 2 sets - 10 reps 11/22/22 - Seated March  - 1 x daily - 7 x weekly - 1 sets - 10 reps - 3 hold - Sit to stand with control  - 1 x daily - 7 x weekly - 1 sets - 10 reps - Modified Thomas Stretch  - 1 x daily - 7 x weekly - 1 sets - 3 reps - 20-30 hold  12-14-22 - Standing Quad Stretch with Towel and Arm Support  - 1 x daily - 7 x weekly - 1 sets - 2-3 reps - 30 sec hold - Forward Step Up  - 1 x daily -  7 x weekly - 3 sets - 10 reps  ASSESSMENT:   CLINICAL IMPRESSION: 12/30/2022 Pt arrives w/o knee pain, 1-2/10 lateral right hip pain during ambulation. She reports improvement in knees and wants to focus on her right hip. Knee pain is more intermittent "when arthritis pain hits"  or when kneeling down on knees. LEFS survey score indicates improvement since last survey 12/02/22. Continued with open and closed chain hip strength and stability. Reviewed quad stretch with towel and she reports min compliance with this exercise at home. No additional goals met.      Eval: Pt endorses ongoing symptoms for several years but have worsened over past year, increasing difficulties with ADLs/mobility. On exam pt demos mild knee flexion deficits on L although symptoms more prominent on R, mild weakness throughout B LE. Concordant pain elicited with quad MMT. 5xSTS time at cutoff score for fall risk in most populations (12-14sec) and indicative of reduced LE strength and functional mobility. Pt tolerates session well, reports improved symptoms after HEP with education for appropriate performance. Recommend skilled PT to address relevant deficits to maximize functional tolerance.   OBJECTIVE IMPAIRMENTS: decreased activity tolerance, decreased balance, decreased endurance, decreased mobility, difficulty walking, decreased ROM, decreased strength, and pain.    ACTIVITY LIMITATIONS: carrying, bending, sitting, standing, squatting, sleeping, stairs, transfers, and locomotion level   PARTICIPATION LIMITATIONS: meal prep, cleaning, laundry, community activity, and occupation   PERSONAL FACTORS: Time since onset of injury/illness/exacerbation and 3+ comorbidities: afib/CHF, HTN, MSK comorbidities (LBP)  are also affecting patient's functional outcome.    REHAB POTENTIAL: Good   CLINICAL DECISION MAKING: Stable/uncomplicated   EVALUATION COMPLEXITY: Low     GOALS: Goals reviewed with patient? No given time constraints   SHORT TERM GOALS: Target date: 11/23/2022   Pt will demonstrate appropriate understanding and performance of initially prescribed HEP in order to facilitate improved independence with management of symptoms.  Baseline: HEP provided on eval 11/22/22: requires cues   12/03/22: Independent with initial HEP Goal status: MET   2. Pt will score greater than or equal to 52 on LEFS in order to demonstrate improved perception of function due to symptoms.             Baseline: 43/80  11/22/22: not tested due to absence from PT  12/02/22: 34/80 12/30/22: 43/80             Goal status: ONGOING   LONG TERM GOALS: Target date: 01/13/2023  (Updated 12/02/22 re eval) Pt will score 60 or  greater  on LEFS in order to demonstrate improved perception of function due to symptoms.  Baseline: 43/80 12/02/22: 34/80  12/30/22: 43/80 Goal status:ONGOING   2.  Pt will demonstrate at least 120 degrees of knee flexion ROM bilaterally in order to facilitate improved mechanics with gait/transfers. Baseline: see ROM chart above 12/02/22: NT given hip re-eval  12-16-22 See chart R 120, L 120 Goal status: met   3.  Pt will demonstrate appropriate performance of final prescribed HEP in order to facilitate improved self-management of symptoms post-discharge.              Baseline: initial HEP prescribed  12/02/22: good HEP adherence reported             Goal status: ONGOING     4.  Pt will be able to perform 5xSTS in less than or equal to 12sec without UE support in order to demonstrate reduced fall risk and improved functional independence (MCID 5xSTS = 2.3 sec).  Baseline: 14.66sec w UE support 12/02/22: 12sec w gentle UE support 12-16-22  Goal status: PARTIALLY MET   5. Pt will report at least 50% decrease in overall pain levels in past week in order to facilitate improved tolerance to basic ADLs/mobility.              Baseline: 2-7/10  12/02/22: 5-6/10 hip at worst, 1-2/10 knees at worst  12-16-22  1/10 today and just a specific pain on lateral thigh  12/30/22: knees and hip can reach 5-6/10             Goal status: PROGRESSING   6. Pt will report/demonstrate ability to stand/walk for up to 45 min with less than 3 pt increase in resting pain in order to promote improved community  navigation and exercise tolerance for overall health/QOL.              Baseline: pain >48min of standing/walking  12/02/22: pain >28min in hip/groin, knees no longer limiting walking most of the time             Goal status: ONGOING  7. Pt will demo R hip ER/IR MMT of at least 4/5 in order to demonstrate improved functional strength.  Baseline: see MMT chart above  Goal status: NEW     PLAN: updated 12/02/22   PT FREQUENCY:  2x/week    PT DURATION: 6 weeks   PLANNED INTERVENTIONS: Therapeutic exercises, Therapeutic activity, Neuromuscular re-education, Balance training, Gait training, Patient/Family education, Self Care, Joint mobilization, Aquatic Therapy, Dry Needling, Cryotherapy, Moist heat, Taping, Manual therapy, and Re-evaluation   PLAN FOR NEXT SESSION: review/update HEP PRN. Quad activation BIL, rotational hip stability, gradual progression to more functional strengthening. Pt wants to work towards floor transfers and more stairs  Jannette Spanner, [place] 12/30/22 10:38 AM Phone: [phone] Fax: [phone]

## 2022-12-31 ENCOUNTER — Telehealth (HOSPITAL_COMMUNITY): Payer: Self-pay

## 2022-12-31 NOTE — Telephone Encounter (Signed)
See letter, patient cleared for surgery per Dr. Gala Romney.

## 2022-12-31 NOTE — Telephone Encounter (Signed)
     Primary Cardiologist: Dr. Lalla Brothers  Chart reviewed as part of pre-operative protocol coverage. Given past medical history and time since last visit, based on ACC/AHA guidelines, Christine Oneal would be at acceptable risk for the planned procedure without further cardiovascular testing.   Patient with diagnosis of afib on Eliquis for anticoagulation.     Procedure: parathyroid surgery Date of procedure: TBD   CHA2DS2-VASc Score = 3  This indicates a 3.2% annual risk of stroke. The patient's score is based upon: CHF History: 1 HTN History: 1 Diabetes History: 0 Stroke History: 0 Vascular Disease History: 0 Age Score: 0 Gender Score: 1   Afib ablation 08/03/22.   CrCl 65mL/min using adj body weight Platelet count 346K   Per office protocol, patient can hold Eliquis for 2-3 days prior to procedure.   I will route this recommendation to the requesting party via Epic fax function and remove from pre-op pool.  Please call with questions.  Thomasene Ripple. Bradford Cazier NP-C     12/31/2022, 10:58 AM [facility] [address] Office [phone] Fax [phone]

## 2023-01-06 ENCOUNTER — Encounter (HOSPITAL_COMMUNITY): Payer: Self-pay

## 2023-01-06 NOTE — Patient Instructions (Addendum)
SURGICAL WAITING ROOM VISITATION  Patients having surgery or a procedure may have no more than 2 support people in the waiting area - these visitors may rotate.    Children under the age of 12 must have an adult with them who is not the patient.  Due to an increase in RSV and influenza rates and associated hospitalizations, children ages 69 and under may not visit patients in [facility] hospitals.  If the patient needs to stay at the hospital during part of their recovery, the visitor guidelines for inpatient rooms apply. Pre-op nurse will coordinate an appropriate time for 1 support person to accompany patient in pre-op.  This support person may not rotate.    Please refer to the [facility] website for the visitor guidelines for Inpatients (after your surgery is over and you are in a regular room).       Your procedure is scheduled on:   Monday, January 17, 2023   Report to [facility] Main Entrance    Report to admitting at 7:15 AM   Call this number if you have problems the morning of surgery [phone]   Do not eat food :After Midnight.   After Midnight you may have the following liquids until 6:30 AM DAY OF SURGERY  Water Non-Citrus Juices (without pulp, NO RED-Apple, White grape, White cranberry) Black Coffee (NO MILK/CREAM OR CREAMERS, sugar ok)  Clear Tea (NO MILK/CREAM OR CREAMERS, sugar ok) regular and decaf                             Plain Jell-O (NO RED)                                           Fruit ices (not with fruit pulp, NO RED)                                     Popsicles (NO RED)                                                               Sports drinks like Gatorade (NO RED)   FOLLOW BOWEL PREP AND ANY ADDITIONAL PRE OP INSTRUCTIONS YOU RECEIVED FROM YOUR SURGEON'S OFFICE!!!     Oral Hygiene is also important to reduce your risk of infection.                                    Remember - BRUSH YOUR TEETH THE MORNING OF SURGERY WITH YOUR  REGULAR TOOTHPASTE  DENTURES WILL BE REMOVED PRIOR TO SURGERY PLEASE DO NOT APPLY "Poly grip" OR ADHESIVES!!!   Do NOT smoke after Midnight   Take these medicines the morning of surgery with A SIP OF WATER:  Carvedilol            Hydralazine            Isosorbide               Hold ELIQUIS  2-3 DAYS PRIOR TO SURGERY   DO NOT TAKE FARXIGA DAY OF SURGERY  DO NOT TAKE ANY ORAL DIABETIC MEDICATIONS DAY OF YOUR SURGERY  Bring CPAP mask and tubing day of surgery.                              You may not have any metal on your body including hair pins, jewelry, and body piercing             Do not wear make-up, lotions, powders, perfumes/cologne, or deodorant  Do not wear nail polish including gel and S&S, artificial/acrylic nails, or any other type of covering on natural nails including finger and toenails. If you have artificial nails, gel coating, etc. that needs to be removed by a nail salon please have this removed prior to surgery or surgery may need to be canceled/ delayed if the surgeon/ anesthesia feels like they are unable to be safely monitored.   Do not shave  48 hours prior to surgery.    Do not bring valuables to the hospital. [facility] IS NOT             RESPONSIBLE   FOR VALUABLES.   Contacts, glasses, dentures or bridgework may not be worn into surgery.  DO NOT BRING YOUR HOME MEDICATIONS TO THE HOSPITAL. PHARMACY WILL DISPENSE MEDICATIONS LISTED ON YOUR MEDICATION LIST TO YOU DURING YOUR ADMISSION IN THE HOSPITAL!    Patients discharged on the day of surgery will not be allowed to drive home.  Someone NEEDS to stay with you for the first 24 hours after anesthesia.   Special Instructions: Bring a copy of your healthcare power of attorney and living will documents the day of surgery if you haven't scanned them before.              Please read over the following fact sheets you were given: IF YOU HAVE QUESTIONS ABOUT YOUR PRE-OP INSTRUCTIONS PLEASE CALL  [phone]    If you test positive for Covid or have been in contact with anyone that has tested positive in the last 10 days please notify you surgeon.    [place] - Preparing for Surgery Before surgery, you can play an important role.  Because skin is not sterile, your skin needs to be as free of germs as possible.  You can reduce the number of germs on your skin by washing with CHG (chlorahexidine gluconate) soap before surgery.  CHG is an antiseptic cleaner which kills germs and bonds with the skin to continue killing germs even after washing. Please DO NOT use if you have an allergy to CHG or antibacterial soaps.  If your skin becomes reddened/irritated stop using the CHG and inform your nurse when you arrive at Short Stay. Do not shave (including legs and underarms) for at least 48 hours prior to the first CHG shower.  You may shave your face/neck.  Please follow these instructions carefully:  1.  Shower with CHG Soap the night before surgery and the  morning of surgery.  2.  If you choose to wash your hair, wash your hair first as usual with your normal  shampoo.  3.  After you shampoo, rinse your hair and body thoroughly to remove the shampoo.                             4.  Use CHG  as you would any other liquid soap.  You can apply chg directly to the skin and wash.  Gently with a scrungie or clean washcloth.  5.  Apply the CHG Soap to your body ONLY FROM THE NECK DOWN.   Do   not use on face/ open                           Wound or open sores. Avoid contact with eyes, ears mouth and   genitals (private parts).                       Wash face,  Genitals (private parts) with your normal soap.             6.  Wash thoroughly, paying special attention to the area where your    surgery  will be performed.  7.  Thoroughly rinse your body with warm water from the neck down.  8.  DO NOT shower/wash with your normal soap after using and rinsing off the CHG Soap.                9.  Pat  yourself dry with a clean towel.            10.  Wear clean pajamas.            11.  Place clean sheets on your bed the night of your first shower and do not  sleep with pets. Day of Surgery : Do not apply any lotions/deodorants the morning of surgery.  Please wear clean clothes to the hospital/surgery center.  FAILURE TO FOLLOW THESE INSTRUCTIONS MAY RESULT IN THE CANCELLATION OF YOUR SURGERY  PATIENT SIGNATURE_________________________________  NURSE SIGNATURE__________________________________  ________________________________________________________________________

## 2023-01-07 ENCOUNTER — Telehealth: Payer: Self-pay | Admitting: Internal Medicine

## 2023-01-07 ENCOUNTER — Encounter: Payer: Self-pay | Admitting: Physical Therapy

## 2023-01-07 ENCOUNTER — Ambulatory Visit: Payer: Medicaid Other | Admitting: Physical Therapy

## 2023-01-07 ENCOUNTER — Other Ambulatory Visit: Payer: Self-pay

## 2023-01-07 DIAGNOSIS — M6281 Muscle weakness (generalized): Secondary | ICD-10-CM

## 2023-01-07 DIAGNOSIS — M25551 Pain in right hip: Secondary | ICD-10-CM

## 2023-01-07 DIAGNOSIS — G8929 Other chronic pain: Secondary | ICD-10-CM

## 2023-01-07 DIAGNOSIS — R2689 Other abnormalities of gait and mobility: Secondary | ICD-10-CM

## 2023-01-07 NOTE — Telephone Encounter (Signed)
Patient called to see if the fax from [facility] was received. She said they faxed it on 01/05/2023. Patient would like a call back at [phone].

## 2023-01-07 NOTE — Therapy (Signed)
OUTPATIENT PHYSICAL THERAPY TREATMENT NOTE   Patient Name: Christine Oneal MRN: [id number] DOB:[birth year], 52 y.o., female Today's Date: 01/07/2023  PCP: Myrlene Broker, MD REFERRING PROVIDER: Rodolph Bong, MD   END OF SESSION:   PT End of Session - 01/07/23 0843     Visit Number 10    Number of Visits 22    Date for PT Re-Evaluation 02/18/23    Authorization Type [employer]    Authorization Time Period submitted for auth 01/07/2023    PT Start Time 0845    PT Stop Time 0930    PT Time Calculation (min) 45 min    Activity Tolerance Patient tolerated treatment well    Behavior During Therapy [facility] for tasks assessed/performed                 Past Medical History:  Diagnosis Date   Abnormal Pap smear of cervix    Allergy    Anemia    CHF (congestive heart failure) (HCC)    Fibroid    History of atrial fibrillation    Hyperparathyroidism (HCC)    Hypertension    Leukocytosis, unspecified 10/18/2013   Obesity    Plantar fasciitis    Past Surgical History:  Procedure Laterality Date   ATRIAL FIBRILLATION ABLATION N/A 08/03/2022   Procedure: ATRIAL FIBRILLATION ABLATION;  Surgeon: Lanier Prude, MD;  Location: [facility] INVASIVE CV LAB;  Service: Cardiovascular;  Laterality: N/A;   CARDIOVERSION N/A 05/12/2021   Procedure: CARDIOVERSION;  Surgeon: Dolores Patty, MD;  Location: [facility] ENDOSCOPY;  Service: Cardiovascular;  Laterality: N/A;   CARDIOVERSION N/A 05/29/2021   Procedure: CARDIOVERSION;  Surgeon: Dolores Patty, MD;  Location: [facility] ENDOSCOPY;  Service: Cardiovascular;  Laterality: N/A;   COLPOSCOPY     RIGHT/LEFT HEART CATH AND CORONARY ANGIOGRAPHY N/A 05/11/2021   Procedure: RIGHT/LEFT HEART CATH AND CORONARY ANGIOGRAPHY;  Surgeon: Dolores Patty, MD;  Location: [facility] INVASIVE CV LAB;  Service: Cardiovascular;  Laterality: N/A;   TEE WITHOUT CARDIOVERSION N/A 05/12/2021   Procedure: TRANSESOPHAGEAL ECHOCARDIOGRAM (TEE);  Surgeon:  Dolores Patty, MD;  Location: [facility] ENDOSCOPY;  Service: Cardiovascular;  Laterality: N/A;   TUBAL LIGATION  2001   Patient Active Problem List   Diagnosis Date Noted   Right foot pain 12/09/2022   Urinary incontinence 08/23/2022   Chronic pain of both knees 08/23/2022   Gait instability 08/23/2022   Dysmenorrhea 05/14/2022   Left foot pain 05/14/2022   Encounter for general adult medical examination with abnormal findings 04/17/2022   Primary hyperparathyroidism (HCC) 11/25/2021   Chronic systolic CHF (congestive heart failure) (HCC) 05/26/2021   Unspecified atrial fibrillation (HCC) 05/04/2021   Anemia 09/04/2017   Morbid obesity (HCC) 09/03/2017   Reactive airway disease 09/03/2017   Vitamin D deficiency 09/03/2017   Essential hypertension 10/24/2007   Hemorrhoids 10/24/2007    REFERRING DIAG: M25.561,M25.562,G89.29 (ICD-10-CM) - Chronic pain of both knees M25.551 (ICD-10-CM) - Right hip pain (added 12/02/22)  THERAPY DIAG:  Chronic pain of both knees  Muscle weakness (generalized)  Other abnormalities of gait and mobility  Pain in right hip  Rationale for Evaluation and Treatment Rehabilitation  PERTINENT HISTORY: HTN, CHF, afib (pt states well controlled since ablation in January although does have some exertional SOB with activity)  PRECAUTIONS: fall hx, cardiac hx    SUBJECTIVE:  SUBJECTIVE STATEMENT:   Pt reports she is doing well. No new issues. She states she feels like she is improving with PT and the dry needling has been helpful. She does continue to have some pain of the right hip and knee that worsens with prolonged standing and walking.  PAIN:  Are you having pain: 2/10 (worst 5-6/10) Location/description: right lateral hip to knee  Aggravating factors: stair navigation,  standing/walking >44min, sleeping R side (wakes up 3x/night on average), sitting >4min Easing factors: rest, changing positions, medication, voltaren     OBJECTIVE: (objective measures completed at initial evaluation unless otherwise dated) DIAGNOSTIC FINDINGS:  Lumbar XR 10/22/22 IMPRESSION: 1. 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 2. Right kidney stone.   BIL knee XR February 2024 with degenerative changes, see EPIC for details  Hip/pelvis XR 11/26/22  IMPRESSION: Degenerative changes lower lumbar spine and both hips. No acute bony or joint abnormality.   PATIENT SURVEYS:  LEFS eval - 43/80 12/02/22: 34/80 12/30/22: 43/80  01/07/2023: 30/80  PALPATION: Concordant tenderness B lat quads and TFL, no pain with gentle patellar mobs in all directions, no joint line or tendon tenderness    LOWER EXTREMITY ROM:      Active  Right eval Left eval Right 11/30/22 Left 11/30/22 R/L 12-16-22  Hip flexion         Hip extension         Hip internal rotation         Hip external rotation         Knee extension 0 0     Knee flexion 120 deg s 110 deg painless 120 114 120/120  (Blank rows = not tested) (Key: WFL = within functional limits not formally assessed, * = concordant pain, s = stiffness/stretching sensation, NT = not tested)  Comments:    LOWER EXTREMITY MMT:     MMT Right eval Left eval R/L 12/02/22 Rt / Lt 01/07/2023  Hip flexion 4 3+ 4/4+ 4 / 4  Hip abduction (modified sitting) 4 4 5/5   Hip internal rotation     3/3+ 4- / 4  Hip external rotation     3+/4 4- / 4  Knee flexion 4+ 4+    Knee extension 4 *  4+  4+ / 5  Ankle dorsiflexion          (Blank rows = not tested) (Key: WFL = within functional limits not formally assessed, * = concordant pain, s = stiffness/stretching sensation, NT = not tested)  Comments:     FUNCTIONAL TESTS:  5xSTS: 14.66 sec w UE support from standard chair no increase in pain 12/02/22 5xSTS: 12.28 sec gentle UE  support at thighs, hip pain   GAIT: Distance walked: within clinic Assistive device utilized: None Level of assistance: Complete Independence Comments: reduced gait speed/cadence, widened BOS, reduced trunk rotation/arm swing   HIP EXAM 12/02/22: LLE negative FABER, FADDIR, distraction/compression RLE positive FADDIR for groin pain, FABER and compression irritate SI, distraction unrelieving       TODAY'S TREATMENT: [facility] Adult PT Treatment:                                                DATE: 01/07/2023 Therapeutic Exercise: NuStep L5 x 5 min with UE/LE while taking subjective Sit to stand 2 x 10 SLR x 10 each,  greater difficulty on right due to report of left tailbone pain Bridge x 10 Hooklying clamshell with green x 10 Forward 6" step-up x 10 each Manual Therapy: Skilled palpation and monitoring of muscle tension while performing TPDN STM with roller over lateral hip, thigh, quad Trigger Point Dry Needling Treatment: Pre-treatment instruction: Patient instructed on dry needling rationale, procedures, and possible side effects including pain during treatment (achy,cramping feeling), bruising, drop of blood, lightheadedness, nausea, sweating. Patient Consent Given: Yes Education handout provided: Previously provided Muscles treated: right piriformis, glute med, TFL  Needle size and number: .30x79mm x 5 Electrical stimulation performed: No Parameters: N/A Treatment response/outcome: Twitch response elicited and Palpable decrease in muscle tension Post-treatment instructions: Patient instructed to expect possible mild to moderate muscle soreness later today and/or tomorrow. Patient instructed in methods to reduce muscle soreness and to continue prescribed HEP. If patient was dry needled over the lung field, patient was instructed on signs and symptoms of pneumothorax and, however unlikely, to see immediate medical attention should they occur. Patient was also educated on signs and  symptoms of infection and to seek medical attention should they occur. Patient verbalized understanding of these instructions and education.   [facility] Adult PT Treatment:                                                DATE: 12/30/22 Therapeutic Exercise: Nustep L5 LE only x 5 minutes 15# STS AMRAP x15 reps 6 inch step up 1 UE x 10 each  Tandem on AIREX 45 sec + x 2 SLS on AIREX 4-8 sec x 2 each  Gastroc slant board  S/L right hip clam x 10, reverse clam x 10 Supine clam green band x10 Banded bridge x 10  Ball squeeze with bridge x 10 Standing quad stretch with UE at counter  LEFS  [facility] Adult PT Treatment:                                                DATE: 12/28/22 Therapeutic Exercise: GTB IR with ball as fulcrum, 2x15 cues for pacing GTB ER with ball as fulcrum 2x15 cues for pacing 10# STS x10, 15# x8, 10# x10 cues for form and pacing Standing hip flexor mini lunge position x10 cues for comfortable ROM  Education on relevant anatomy/physiology as it pertains to exercise in session, HEP, and general exercise outside of sessions Therapeutic Activity: Time spent w/ education/discussion re: functional mobility, floor transfers, stair navigation, and strategies to progress exercises towards these goals as appropriate Fwd step ups 6 inch at counter x5 BIL, 4inch x6 BIL cues for pacing and control (particularly eccentric control on RLE)   PATIENT EDUCATION:  Education details: POC, HEP Person educated: Patient Education method: Explanation, Demonstration, Tactile cues, Verbal cues Education comprehension: verbalized understanding, returned demonstration, verbal cues required, tactile cues required, and needs further education     HOME EXERCISE PROGRAM: Access Code: [email or web address]   ASSESSMENT: CLINICAL IMPRESSION: Patient tolerated therapy well with no adverse effects. Patient demonstrates improvement in her hip strength and her 5xSTS this visit but does continue to report limitations with  her functional ability on LEFS and with her standing/walking tolerance. Therapy continued to incorporate TPDN for the right hip and  progressing her hip and knee strengthening. Overall she seems to be progressing toward her LTGs with therapy and subjective reporting improvement in her symptoms with treatment. Patient would benefit from continued skilled PT to progress her mobility and strength in order to reduce pain and maximize functional ability, so will extend PT POC for 6 more weeks.   OBJECTIVE IMPAIRMENTS: decreased activity tolerance, decreased balance, decreased endurance, decreased mobility, difficulty walking, decreased ROM, decreased strength, and pain.    ACTIVITY LIMITATIONS: carrying, bending, sitting, standing, squatting, sleeping, stairs, transfers, and locomotion level   PARTICIPATION LIMITATIONS: meal prep, cleaning, laundry, community activity, and occupation   PERSONAL FACTORS: Time since onset of injury/illness/exacerbation and 3+ comorbidities: afib/CHF, HTN, MSK comorbidities (LBP)  are also affecting patient's functional outcome.      GOALS: Goals reviewed with patient? No given time constraints   SHORT TERM GOALS: Target date: = LTG   Pt will demonstrate appropriate understanding and performance of initially prescribed HEP in order to facilitate improved independence with management of symptoms.  Baseline: HEP provided on eval 11/22/22: requires cues  12/03/22: Independent with initial HEP Goal status: MET   2. Pt will score greater than or equal to 52 on LEFS in order to demonstrate improved perception of function due to symptoms.             Baseline: 43/80  11/22/22: not tested due to absence from PT  12/02/22: 34/80 12/30/22: 43/80 01/07/2023: 30/80             Goal status: ONGOING   LONG TERM GOALS: Target date: 02/18/2023  Pt will score 60 or  greater  on LEFS in order to demonstrate improved perception of function due to symptoms.  Baseline: 43/80 12/02/22:  34/80  12/30/22: 43/80 01/07/2023: 30/80 Goal status:ONGOING   2.  Pt will demonstrate at least 120 degrees of knee flexion ROM bilaterally in order to facilitate improved mechanics with gait/transfers. Baseline: see ROM chart above 12/02/22: NT given hip re-eval  12-16-22 See chart R 120, L 120 Goal status: MET   3.  Pt will demonstrate appropriate performance of final prescribed HEP in order to facilitate improved self-management of symptoms post-discharge.              Baseline: initial HEP prescribed  12/02/22: good HEP adherence reported  01/07/2023: good HEP adherence reported             Goal status: ONGOING     4.  Pt will be able to perform 5xSTS in less than or equal to 12sec without UE support in order to demonstrate reduced fall risk and improved functional independence (MCID 5xSTS = 2.3 sec). Baseline: 14.66sec w UE support 12/02/22: 12sec w gentle UE support 01/07/2023: 12 seconds without UE support Goal status: MET   5. Pt will report at least 50% decrease in overall pain levels in past week in order to facilitate improved tolerance to basic ADLs/mobility.              Baseline: 2-7/10  12/02/22: 5-6/10 hip at worst, 1-2/10 knees at worst  12-16-22  1/10 today and just a specific pain on lateral thigh  12/30/22: knees and hip can reach 5-6/10  01/07/2023: worst 5-6/10             Goal status: PROGRESSING   6. Pt will report/demonstrate ability to stand/walk for up to 45 min with less than 3 pt increase in resting pain in order to promote improved community navigation and exercise  tolerance for overall health/QOL.              Baseline: pain >3min of standing/walking  12/02/22: pain >101min in hip/groin, knees no longer limiting walking most of the time 01/07/2023: patient reports 15-20 minutes on a good day             Goal status: ONGOING  7. Pt will demo R hip ER/IR MMT of at least 4/5 in order to demonstrate improved functional strength.  Baseline: see MMT chart  above  01/07/2023: 4-/5 MMT right hip ER/IR  Goal status: ONGOING     PLAN   PT FREQUENCY:  2x/week    PT DURATION: 6 weeks   PLANNED INTERVENTIONS: Therapeutic exercises, Therapeutic activity, Neuromuscular re-education, Balance training, Gait training, Patient/Family education, Self Care, Joint mobilization, Aquatic Therapy, Dry Needling, Cryotherapy, Moist heat, Taping, Manual therapy, and Re-evaluation   PLAN FOR NEXT SESSION: review/update HEP PRN. Quad activation BIL, rotational hip stability, gradual progression to more functional strengthening. Pt wants to work towards floor transfers and more stairs   Rosana Hoes, PT, DPT, LAT, ATC 01/07/23  11:21 AM Phone: [phone] Fax: [phone]     Check all possible CPT codes: 69629 - PT Re-evaluation, 97110- Therapeutic Exercise, [phone]- Neuro Re-education, [phone] - Gait Training, [phone] - Manual Therapy, 97530 - Therapeutic Activities, [phone] - Self Care, and [phone] - Aquatic therapy    Check all conditions that are expected to impact treatment: {Conditions expected to impact treatment:Morbid obesity, Musculoskeletal disorders, and Social determinants of health   If treatment provided at initial evaluation, no treatment charged due to lack of authorization.

## 2023-01-08 ENCOUNTER — Other Ambulatory Visit: Payer: Self-pay | Admitting: Nurse Practitioner

## 2023-01-10 ENCOUNTER — Other Ambulatory Visit (HOSPITAL_COMMUNITY): Payer: Self-pay | Admitting: Cardiology

## 2023-01-10 MED ORDER — SPIRONOLACTONE 25 MG PO TABS: 25.0000 mg | ORAL_TABLET | Freq: Every day | ORAL | 1 refills | Status: DC

## 2023-01-10 MED ORDER — DAPAGLIFLOZIN PROPANEDIOL 10 MG PO TABS: 10.0000 mg | ORAL_TABLET | Freq: Every day | ORAL | 1 refills | Status: DC

## 2023-01-10 NOTE — Telephone Encounter (Signed)
This has been received and fax back over

## 2023-01-11 ENCOUNTER — Ambulatory Visit: Payer: Medicaid Other | Attending: Family Medicine | Admitting: Physical Therapy

## 2023-01-11 ENCOUNTER — Encounter: Payer: Self-pay | Admitting: Physical Therapy

## 2023-01-11 DIAGNOSIS — M25561 Pain in right knee: Secondary | ICD-10-CM | POA: Insufficient documentation

## 2023-01-11 DIAGNOSIS — R2689 Other abnormalities of gait and mobility: Secondary | ICD-10-CM | POA: Insufficient documentation

## 2023-01-11 DIAGNOSIS — M6281 Muscle weakness (generalized): Secondary | ICD-10-CM | POA: Diagnosis not present

## 2023-01-11 DIAGNOSIS — M25551 Pain in right hip: Secondary | ICD-10-CM | POA: Diagnosis not present

## 2023-01-11 DIAGNOSIS — M25562 Pain in left knee: Secondary | ICD-10-CM | POA: Diagnosis not present

## 2023-01-11 DIAGNOSIS — G8929 Other chronic pain: Secondary | ICD-10-CM | POA: Diagnosis not present

## 2023-01-11 NOTE — Telephone Encounter (Signed)
[facility] called and said they did not receive that fax from [place] and would like to know if it can be re-faxed.

## 2023-01-11 NOTE — Telephone Encounter (Signed)
This has been fax to [phone]

## 2023-01-11 NOTE — Therapy (Signed)
OUTPATIENT PHYSICAL THERAPY TREATMENT NOTE   Patient Name: Christine Oneal MRN: [id number] DOB:November 04, 1970, 52 y.o., female Today's Date: 01/11/2023  PCP: Myrlene Broker, MD REFERRING PROVIDER: Rodolph Bong, MD   END OF SESSION:   PT End of Session - 01/11/23 1002     Visit Number 11    Number of Visits 22    Date for PT Re-Evaluation 02/18/23    Authorization Type [employer]    Authorization Time Period 9 visits 01/10/23-03/10/23    PT Start Time 1008    PT Stop Time 1048    PT Time Calculation (min) 40 min    Activity Tolerance Patient tolerated treatment well    Behavior During Therapy [facility] for tasks assessed/performed               Past Medical History:  Diagnosis Date   Abnormal Pap smear of cervix    Allergy    Anemia    CHF (congestive heart failure) (HCC)    Fibroid    History of atrial fibrillation    Hyperparathyroidism (HCC)    Hypertension    Leukocytosis, unspecified 10/18/2013   Obesity    Plantar fasciitis    Past Surgical History:  Procedure Laterality Date   ATRIAL FIBRILLATION ABLATION N/A 08/03/2022   Procedure: ATRIAL FIBRILLATION ABLATION;  Surgeon: Lanier Prude, MD;  [facility] INV[facility] CV LAB;  Service: Cardiovascular;  Laterality: N/A;   CARDIOVERSION N/A 05/12/2021   Procedure: CARDIOVERSION;  Surgeon: Dolores Patty, MD;  Location: [facility] ENDOSCOPY;  Service: Cardiovascular;  Laterality: N/A;   CARDIOVERSION N/A 05/29/2021   Procedure: CARDIOVERSION;  Surgeon: Dolores Patty, MD;  Location: [facility] ENDOSCOPY;  Service: Cardiovascular;  Laterality: N/A;   COLPOSCOPY     RIGHT/LEFT HEART CATH AND CORONARY ANGIOGRAPHY N/A 05/11/2021   Procedure: RIGHT/LEFT HEART CATH AND CORONARY ANGIOGRAPHY;  Surgeon: Dolores Patty, MD;  Location: [facility] INVAS[facility] CV LAB;  Service: Cardiovascular;  Laterality: N/A;   TEE WITHOUT CARDIOVERSION N/A 05/12/2021   Procedure: TRANSESOPHAGEAL ECHOCARDIOGRAM (TEE);  Surgeon: Dolores Patty, MD;  Location: [facility] ENDOSCOPY;  Service: Cardiovascular;  Laterality: N/A;   TUBAL LIGATION  2001   Patient Active Problem List   Diagnosis Date Noted   Right foot pain 12/09/2022   Urinary incontinence 08/23/2022   Chronic pain of both knees 08/23/2022   Gait instability 08/23/2022   Dysmenorrhea 05/14/2022   Left foot pain 05/14/2022   Encounter for general adult medical examination with abnormal findings 04/17/2022   Primary hyperparathyroidism (HCC) 11/25/2021   Chronic systolic CHF (congestive heart failure) (HCC) 05/26/2021   Unspecified atrial fibrillation (HCC) 05/04/2021   Anemia 09/04/2017   Morbid obesity (HCC) 09/03/2017   Reactive airway disease 09/03/2017   Vitamin D deficiency 09/03/2017   Essential hypertension 10/24/2007   Hemorrhoids 10/24/2007    REFERRING DIAG: M25.561,M25.562,G89.29 (ICD-10-CM) - Chronic pain of both knees M25.551 (ICD-10-CM) - Right hip pain (added 12/02/22)  THERAPY DIAG:  Chronic pain of both knees  Muscle weakness (generalized)  Other abnormalities of gait and mobility  Pain in right hip  Rationale for Evaluation and Treatment Rehabilitation  PERTINENT HISTORY: HTN, CHF, afib (pt states well controlled since ablation in January although does have some exertional SOB with activity)  PRECAUTIONS: fall hx, cardiac hx    SUBJECTIVE:  SUBJECTIVE STATEMENT:   Arrives w/ 2/10 pain in hip. States she was quite sore for a couple days after last session but overall about the same. Continues to have pain with prolonged walking/standing.  PAIN:  Are you having pain: 2/10 (worst 5-6/10) Location/description: right lateral hip to knee  Aggravating factors: stair navigation, standing/walking >17min, sleeping R side (wakes up 3x/night on average), sitting  >5min Easing factors: rest, changing positions, medication, voltaren     OBJECTIVE: (objective measures completed at initial evaluation unless otherwise dated) DIAGNOSTIC FINDINGS:  Lumbar XR 10/22/22 IMPRESSION: 1. 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 2. Right kidney stone.   BIL knee XR February 2024 with degenerative changes, see EPIC for details  Hip/pelvis XR 11/26/22  IMPRESSION: Degenerative changes lower lumbar spine and both hips. No acute bony or joint abnormality.   PATIENT SURVEYS:  LEFS eval - 43/80 12/02/22: 34/80 12/30/22: 43/80  01/07/2023: 30/80  PALPATION: Concordant tenderness B lat quads and TFL, no pain with gentle patellar mobs in all directions, no joint line or tendon tenderness    LOWER EXTREMITY ROM:      Active  Right eval Left eval Right 11/30/22 Left 11/30/22 R/L 12-16-22  Hip flexion         Hip extension         Hip internal rotation         Hip external rotation         Knee extension 0 0     Knee flexion 120 deg s 110 deg painless 120 114 120/120  (Blank rows = not tested) (Key: WFL = within functional limits not formally assessed, * = concordant pain, s = stiffness/stretching sensation, NT = not tested)  Comments:    LOWER EXTREMITY MMT:     MMT Right eval Left eval R/L 12/02/22 Rt / Lt 01/07/2023  Hip flexion 4 3+ 4/4+ 4 / 4  Hip abduction (modified sitting) 4 4 5/5   Hip internal rotation     3/3+ 4- / 4  Hip external rotation     3+/4 4- / 4  Knee flexion 4+ 4+    Knee extension 4 *  4+  4+ / 5  Ankle dorsiflexion          (Blank rows = not tested) (Key: WFL = within functional limits not formally assessed, * = concordant pain, s = stiffness/stretching sensation, NT = not tested)  Comments:     FUNCTIONAL TESTS:  5xSTS: 14.66 sec w UE support from standard chair no increase in pain 12/02/22 5xSTS: 12.28 sec gentle UE support at thighs, hip pain   GAIT: Distance walked: within clinic Assistive device  utilized: None Level of assistance: Complete Independence Comments: reduced gait speed/cadence, widened BOS, reduced trunk rotation/arm swing   HIP EXAM 12/02/22: LLE negative FABER, FADDIR, distraction/compression RLE positive FADDIR for groin pain, FABER and compression irritate SI, distraction unrelieving       TODAY'S TREATMENT: [facility] Adult PT Treatment:                                                DATE: 01/11/23 Therapeutic Exercise: Nu step L7 UE/LE  STS BW x12, 10# x8, 15# x5, 10# x8, BW x12 SLR attempt w/ and w/o contralat ball, discontinued after a couple reps due to tailbone pain Quad set with  heel prop x12 BIL LE cues for quad contraction Hooklying adductor iso x12 cues for comfortable contraction Hooklying swiss ball press down x12 cues for breath control  6 inch fwd step ups x10 BIL LE    [facility] Adult PT Treatment:                                                DATE: 01/07/2023 Therapeutic Exercise: NuStep L5 x 5 min with UE/LE while taking subjective Sit to stand 2 x 10 SLR x 10 each, greater difficulty on right due to report of left tailbone pain Bridge x 10 Hooklying clamshell with green x 10 Forward 6" step-up x 10 each Manual Therapy: Skilled palpation and monitoring of muscle tension while performing TPDN STM with roller over lateral hip, thigh, quad Trigger Point Dry Needling Treatment: Pre-treatment instruction: Patient instructed on dry needling rationale, procedures, and possible side effects including pain during treatment (achy,cramping feeling), bruising, drop of blood, lightheadedness, nausea, sweating. Patient Consent Given: Yes Education handout provided: Previously provided Muscles treated: right piriformis, glute med, TFL  Needle size and number: .30x32mm x 5 Electrical stimulation performed: No Parameters: N/A Treatment response/outcome: Twitch response elicited and Palpable decrease in muscle tension Post-treatment instructions: Patient  instructed to expect possible mild to moderate muscle soreness later today and/or tomorrow. Patient instructed in methods to reduce muscle soreness and to continue prescribed HEP. If patient was dry needled over the lung field, patient was instructed on signs and symptoms of pneumothorax and, however unlikely, to see immediate medical attention should they occur. Patient was also educated on signs and symptoms of infection and to seek medical attention should they occur. Patient verbalized understanding of these instructions and education.   [facility] Adult PT Treatment:                                                DATE: 12/30/22 Therapeutic Exercise: Nustep L5 LE only x 5 minutes 15# STS AMRAP x15 reps 6 inch step up 1 UE x 10 each  Tandem on AIREX 45 sec + x 2 SLS on AIREX 4-8 sec x 2 each  Gastroc slant board  S/L right hip clam x 10, reverse clam x 10 Supine clam green band x10 Banded bridge x 10  Ball squeeze with bridge x 10 Standing quad stretch with UE at counter  LEFS  [facility] Adult PT Treatment:                                                DATE: 12/28/22 Therapeutic Exercise: GTB IR with ball as fulcrum, 2x15 cues for pacing GTB ER with ball as fulcrum 2x15 cues for pacing 10# STS x10, 15# x8, 10# x10 cues for form and pacing Standing hip flexor mini lunge position x10 cues for comfortable ROM  Education on relevant anatomy/physiology as it pertains to exercise in session, HEP, and general exercise outside of sessions Therapeutic Activity: Time spent w/ education/discussion re: functional mobility, floor transfers, stair navigation, and strategies to progress exercises towards these goals as appropriate Fwd step ups 6 inch at counter  x5 BIL, 4inch x6 BIL cues for pacing and control (particularly eccentric control on RLE)   PATIENT EDUCATION:  Education details: POC, HEP Person educated: Patient Education method: Explanation, Demonstration, Tactile cues, Verbal cues Education  comprehension: verbalized understanding, returned demonstration, verbal cues required, tactile cues required, and needs further education     HOME EXERCISE PROGRAM: Access Code: [email or web address]   ASSESSMENT: CLINICAL IMPRESSION: Pt arrives w/ 2/10 hip pain, no knee pain. Today continuing to work on open and closed chain hip/knee strengthening, cues as above. Primary progressions with volume today. No adverse events, departs with report of 1/10 R hip pain. Recommend continuing along current POC in order to address relevant deficits and improve functional tolerance. Pt departs today's session in no acute distress, all voiced questions/concerns addressed appropriately from PT perspective.    OBJECTIVE IMPAIRMENTS: decreased activity tolerance, decreased balance, decreased endurance, decreased mobility, difficulty walking, decreased ROM, decreased strength, and pain.    ACTIVITY LIMITATIONS: carrying, bending, sitting, standing, squatting, sleeping, stairs, transfers, and locomotion level   PARTICIPATION LIMITATIONS: meal prep, cleaning, laundry, community activity, and occupation   PERSONAL FACTORS: Time since onset of injury/illness/exacerbation and 3+ comorbidities: afib/CHF, HTN, MSK comorbidities (LBP)  are also affecting patient's functional outcome.      GOALS: Goals reviewed with patient? No given time constraints   SHORT TERM GOALS: Target date: = LTG   Pt will demonstrate appropriate understanding and performance of initially prescribed HEP in order to facilitate improved independence with management of symptoms.  Baseline: HEP provided on eval 11/22/22: requires cues  12/03/22: Independent with initial HEP Goal status: MET   2. Pt will score greater than or equal to 52 on LEFS in order to demonstrate improved perception of function due to symptoms.             Baseline: 43/80  11/22/22: not tested due to absence from PT  12/02/22: 34/80 12/30/22: 43/80 01/07/2023: 30/80             Goal  status: ONGOING   LONG TERM GOALS: Target date: 02/18/2023  Pt will score 60 or  greater  on LEFS in order to demonstrate improved perception of function due to symptoms.  Baseline: 43/80 12/02/22: 34/80  12/30/22: 43/80 01/07/2023: 30/80 Goal status:ONGOING   2.  Pt will demonstrate at least 120 degrees of knee flexion ROM bilaterally in order to facilitate improved mechanics with gait/transfers. Baseline: see ROM chart above 12/02/22: NT given hip re-eval  12-16-22 See chart R 120, L 120 Goal status: MET   3.  Pt will demonstrate appropriate performance of final prescribed HEP in order to facilitate improved self-management of symptoms post-discharge.              Baseline: initial HEP prescribed  12/02/22: good HEP adherence reported  01/07/2023: good HEP adherence reported             Goal status: ONGOING     4.  Pt will be able to perform 5xSTS in less than or equal to 12sec without UE support in order to demonstrate reduced fall risk and improved functional independence (MCID 5xSTS = 2.3 sec). Baseline: 14.66sec w UE support 12/02/22: 12sec w gentle UE support 01/07/2023: 12 seconds without UE support Goal status: MET   5. Pt will report at least 50% decrease in overall pain levels in past week in order to facilitate improved tolerance to basic ADLs/mobility.              Baseline: 2-7/10  12/02/22: 5-6/10 hip at worst, 1-2/10 knees at worst  12-16-22  1/10 today and just a specific pain on lateral thigh  12/30/22: knees and hip can reach 5-6/10  01/07/2023: worst 5-6/10             Goal status: PROGRESSING   6. Pt will report/demonstrate ability to stand/walk for up to 45 min with less than 3 pt increase in resting pain in order to promote improved community navigation and exercise tolerance for overall health/QOL.              Baseline: pain >64min of standing/walking  12/02/22: pain >76min in hip/groin, knees no longer limiting walking most of the time 01/07/2023: patient reports 15-20  minutes on a good day             Goal status: ONGOING  7. Pt will demo R hip ER/IR MMT of at least 4/5 in order to demonstrate improved functional strength.  Baseline: see MMT chart above  01/07/2023: 4-/5 MMT right hip ER/IR  Goal status: ONGOING     PLAN   PT FREQUENCY:  2x/week    PT DURATION: 6 weeks   PLANNED INTERVENTIONS: Therapeutic exercises, Therapeutic activity, Neuromuscular re-education, Balance training, Gait training, Patient/Family education, Self Care, Joint mobilization, Aquatic Therapy, Dry Needling, Cryotherapy, Moist heat, Taping, Manual therapy, and Re-evaluation   PLAN FOR NEXT SESSION: review/update HEP PRN. Quad activation BIL, rotational hip stability, gradual progression to more functional strengthening. Pt wants to work towards floor transfers and more stairs   Ashley Murrain PT, DPT 01/11/2023 11:00 AM      Check all possible CPT codes: 40981 - PT Re-evaluation, 97110- Therapeutic Exercise, [phone]- Neuro Re-education, [phone] - Gait Training, [phone] - Manual Therapy, [phone] - Therapeutic Activities, [phone] - Self Care, and [phone] - Aquatic therapy    Check all conditions that are expected to impact treatment: {Conditions expected to impact treatment:Morbid obesity, Musculoskeletal disorders, and Social determinants of health   If treatment provided at initial evaluation, no treatment charged due to lack of authorization.

## 2023-01-12 ENCOUNTER — Other Ambulatory Visit: Payer: Self-pay

## 2023-01-12 ENCOUNTER — Encounter (HOSPITAL_COMMUNITY)
Admission: RE | Admit: 2023-01-12 | Discharge: 2023-01-12 | Disposition: A | Discharge status: Home / Self Care | Payer: Medicaid Other | Source: Ambulatory Visit | Attending: Surgery | Admitting: Surgery

## 2023-01-12 ENCOUNTER — Encounter (HOSPITAL_COMMUNITY): Payer: Self-pay

## 2023-01-12 VITALS — BP 117/74 | HR 58 | Temp 98.0°F | Resp 16 | Ht 63.0 in | Wt 223.0 lb

## 2023-01-12 DIAGNOSIS — D649 Anemia, unspecified: Secondary | ICD-10-CM | POA: Diagnosis not present

## 2023-01-12 DIAGNOSIS — I4891 Unspecified atrial fibrillation: Secondary | ICD-10-CM | POA: Insufficient documentation

## 2023-01-12 DIAGNOSIS — I130 Hypertensive heart and chronic kidney disease with heart failure and stage 1 through stage 4 chronic kidney disease, or unspecified chronic kidney disease: Secondary | ICD-10-CM | POA: Insufficient documentation

## 2023-01-12 DIAGNOSIS — N189 Chronic kidney disease, unspecified: Secondary | ICD-10-CM | POA: Diagnosis not present

## 2023-01-12 DIAGNOSIS — Z79899 Other long term (current) drug therapy: Secondary | ICD-10-CM | POA: Insufficient documentation

## 2023-01-12 DIAGNOSIS — Z7901 Long term (current) use of anticoagulants: Secondary | ICD-10-CM | POA: Diagnosis not present

## 2023-01-12 DIAGNOSIS — Z01812 Encounter for preprocedural laboratory examination: Secondary | ICD-10-CM | POA: Insufficient documentation

## 2023-01-12 DIAGNOSIS — E213 Hyperparathyroidism, unspecified: Secondary | ICD-10-CM | POA: Insufficient documentation

## 2023-01-12 DIAGNOSIS — I509 Heart failure, unspecified: Secondary | ICD-10-CM | POA: Insufficient documentation

## 2023-01-12 DIAGNOSIS — E210 Primary hyperparathyroidism: Secondary | ICD-10-CM | POA: Insufficient documentation

## 2023-01-12 DIAGNOSIS — I10 Essential (primary) hypertension: Secondary | ICD-10-CM

## 2023-01-12 HISTORY — DX: Hyperparathyroidism, unspecified: E21.3

## 2023-01-12 HISTORY — DX: Cardiac arrhythmia, unspecified: I49.9

## 2023-01-12 HISTORY — DX: Personal history of other diseases of the circulatory system: Z86.79

## 2023-01-12 HISTORY — DX: Personal history of urinary calculi: Z87.442

## 2023-01-12 HISTORY — DX: Unspecified osteoarthritis, unspecified site: M19.90

## 2023-01-12 HISTORY — DX: Pneumonia, unspecified organism: J18.9

## 2023-01-12 HISTORY — DX: Family history of other specified conditions: Z84.89

## 2023-01-12 LAB — BASIC METABOLIC PANEL
Anion gap: 5 (ref 5–15)
BUN: 29 mg/dL — ABNORMAL HIGH (ref 6–20)
CO2: 26 mmol/L (ref 22–32)
Calcium: 10.3 mg/dL (ref 8.9–10.3)
Chloride: 107 mmol/L (ref 98–111)
Creatinine, Ser: 1.52 mg/dL — ABNORMAL HIGH (ref 0.44–1.00)
GFR, Estimated: 41 mL/min — ABNORMAL LOW (ref >60)
Glucose, Bld: 82 mg/dL (ref 70–99)
Potassium: 4.5 mmol/L (ref 3.5–5.1)
Sodium: 138 mmol/L (ref 135–145)

## 2023-01-12 LAB — CBC
HCT: 34.5 % — ABNORMAL LOW (ref 36.0–46.0)
Hemoglobin: 11.4 g/dL — ABNORMAL LOW (ref 12.0–15.0)
MCH: 34.4 pg — ABNORMAL HIGH (ref 26.0–34.0)
MCHC: 33 g/dL (ref 30.0–36.0)
MCV: 104.2 fL — ABNORMAL HIGH (ref 80.0–100.0)
Platelets: 298 10*3/uL (ref 150–400)
RBC: 3.31 MIL/uL — ABNORMAL LOW (ref 3.87–5.11)
RDW: 13.2 % (ref 11.5–15.5)
WBC: 5.4 10*3/uL (ref 4.0–10.5)
nRBC: 0 % (ref 0.0–0.2)

## 2023-01-12 NOTE — Progress Notes (Addendum)
PCP - Dr. Hillard Danker Cardiologist -Dr Arvilla Meres clearance 12-15-22 [name] PA   PPM/ICD -  Device Orders -  Rep Notified -   Chest x-ray -  EKG - 11-12-22 epic Stress Test -  ECHO - 07-26-22 epic Cardiac Cath -   Sleep Study -  CPAP -   Fasting Blood Sugar -  Checks Blood Sugar _____ times a day  Blood Thinner Instructions:Eliquis hold 3 days Aspirin Instructions:  ERAS Protcol - PRE-SURGERY Ensure or G2-    COVID vaccine -  Activity--Able to climb a flight of stairs with no CP or SOB Anesthesia review: A-fib, CHF,   Patient denies shortness of breath, fever, cough and chest pain at PAT appointment   All instructions explained to the patient, with a verbal understanding of the material. Patient agrees to go over the instructions while at home for a better understanding. Patient also instructed to self quarantine after being tested for COVID-19. The opportunity to ask questions was provided.

## 2023-01-14 ENCOUNTER — Encounter (HOSPITAL_COMMUNITY): Payer: Self-pay

## 2023-01-14 NOTE — Progress Notes (Signed)
Case: [id number] Date/Time: 01/17/23 0915   Procedure: NECK EXPLORATION WITH PARATHYROIDECTOMY   Anesthesia type: General   Pre-op diagnosis: PRIMARY HYPERPARATHYROIDISM   Location: [facility] ROOM 04 / [facility]   Surgeons: Darnell Level, MD       DISCUSSION: Christine Oneal is a 52 yo female who presents to PAT prior to surgery listed above. PMH significant for HTN, CHF, A.fib s/p ablation on 08/03/22, chronic anticoagulation (Eliquis), obesity, anemia, CKD, hyperparathyroidism.    No prior anesthesia complications.  Patient follows closely with Cardiology for hx of CHF, A.fib s/p ablation, HTN. Last seen in the office on 11/12/22. She was advised she could stop Amiodirone but has continued Eliquis. Cardiac risk assessment and clearance provided on 12/31/22:  "Chart reviewed as part of pre-operative protocol coverage. Given past medical history and time since last visit, based on ACC/AHA guidelines, Christine Oneal would be at acceptable risk for the planned procedure without further cardiovascular testing."  Last dose of Eliquis will be 7/5  VS: BP 117/74   Pulse (!) 58   Temp 36.7 C (Oral)   Resp 16   Ht 5\' 3"  (1.6 m)   Wt 101.2 kg   SpO2 100%   BMI 39.50 kg/m   PROVIDERS: PCP - Dr. Hillard Danker Cardiologist -Dr Arvilla Meres EP - Steffanie Dunn, MD   LABS: Labs reviewed: Acceptable for surgery. Creatinine elevated, consistent with baseline. Hgb stable. (all labs ordered are listed, but only abnormal results are displayed)  Labs Reviewed  BASIC METABOLIC PANEL - Abnormal; Notable for the following components:      Result Value   BUN 29 (*)    Creatinine, Ser 1.52 (*)    GFR, Estimated 41 (*)    All other components within normal limits  CBC - Abnormal; Notable for the following components:   RBC 3.31 (*)    Hemoglobin 11.4 (*)    HCT 34.5 (*)    MCV 104.2 (*)    MCH 34.4 (*)    All other components within normal limits     IMAGES: n/a   EKG 11/12/22  NSR,  rate 61  CV:  Ablation 08/03/22:  CONCLUSIONS: 1. Successful PVI 2. Successful ablation/isolation of the posterior wall 3. Success ablation of the cavotricuspid isthmus for typical atrial flutter 4. Intracardiac echo reveals trivial pericardial effusion 5. No early apparent complications. 6. Colchicine 0.6mg  PO BID x 5 days 7. Protonix 40mg  PO daily x 45 days  Cardiac CTA 07/27/22:  IMPRESSION: 1. There is normal pulmonary vein drainage into the left atrium with ostial measurements above.   2. There is no thrombus in the left atrial appendage.   3. The esophagus runs in the left atrial midline and is not in proximity to any of the pulmonary vein ostia.   4. No PFO/ASD.   5. Normal coronary origin. Right dominance.   6. CAC score of 0 which is 0 percentile for age-, race-, and sex-matched controls.  Echo 07/26/22:  IMPRESSIONS     1. Left ventricular ejection fraction, by estimation, is 55 to 60%. The  left ventricle has normal function. The left ventricle has no regional  wall motion abnormalities. There is mild left ventricular hypertrophy.  Left ventricular diastolic parameters  are indeterminate.   2. Right ventricular systolic function is normal. The right ventricular  size is normal. Tricuspid regurgitation signal is inadequate for assessing  PA pressure.   3. No evidence of mitral valve regurgitation.   4. The aortic valve is  grossly normal. Aortic valve regurgitation is not  visualized.   5. The inferior vena cava is normal in size with greater than 50%  respiratory variability, suggesting right atrial pressure of 3 mmHg.   Left/Right heart cath 05/11/21:  Assessment: 1. Normal coronary arteries 2. 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 Well-compensated hemodynamics after large-volume diuresis     Past Medical History:  Diagnosis Date   Abnormal Pap smear of cervix    Allergy    Anemia    Arthritis    CHF (congestive  heart failure) (HCC)    Dysrhythmia    A fib   Family history of adverse reaction to anesthesia    Fibroid    History of atrial fibrillation    History of kidney stones    Hyperparathyroidism (HCC)    Hypertension    Leukocytosis, unspecified 10/18/2013   Obesity    Plantar fasciitis    Pneumonia     Past Surgical History:  Procedure Laterality Date   ATRIAL FIBRILLATION ABLATION N/A 08/03/2022   Procedure: ATRIAL FIBRILLATION ABLATION;  Surgeon: Lanier Prude, MD;  [facility] INVASIVE CV LAB;  Service: Cardiovascular;  Laterality: N/A;   CARDIOVERSION N/A 05/12/2021   Procedure: CARDIOVERSION;  Surgeon: Dolores Patty, MD;  Location: [facility] ENDOSCOPY;  Service: Cardiovascular;  Laterality: N/A;   CARDIOVERSION N/A 05/29/2021   Procedure: CARDIOVERSION;  Surgeon: Dolores Patty, MD;  Location: [facility] ENDOSCOPY;  Service: Cardiovascular;  Laterality: N/A;   COLPOSCOPY     RIGHT/LEFT HEART CATH AND CORONARY ANGIOGRAPHY N/A 05/11/2021   Procedure: RIGHT/LEFT HEART CATH AND CORONARY ANGIOGRAPHY;  Surgeon: Dolores Patty, MD;  Location: [facility] INVASIVE CV LAB;  Service: Cardiovascular;  Laterality: N/A;   TEE WITHOUT CARDIOVERSION N/A 05/12/2021   Procedure: TRANSESOPHAGEAL ECHOCARDIOGRAM (TEE);  Surgeon: Dolores Patty, MD;  Location: [facility] ENDOSCOPY;  Service: Cardiovascular;  Laterality: N/A;   TUBAL LIGATION  2001    MEDICATIONS:  acetaminophen (TYLENOL) 500 MG tablet   apixaban (ELIQUIS) 5 MG TABS tablet   azelastine (ASTELIN) 0.1 % nasal spray   carvedilol (COREG) 6.25 MG tablet   Cholecalciferol (VITAMIN D3) 25 MCG (1000 UT) CAPS   dapagliflozin propanediol (FARXIGA) 10 MG TABS tablet   dextromethorphan-guaiFENesin (MUCINEX DM) 30-600 MG 12hr tablet   gabapentin (NEURONTIN) 100 MG capsule   hydrALAZINE (APRESOLINE) 50 MG tablet   hydrocortisone (ANUSOL-HC) 2.5 % rectal cream   isosorbide mononitrate (IMDUR) 30 MG 24 hr tablet   KLOR-CON M20 20 MEQ tablet    lidocaine (LIDODERM) 5 %   losartan (COZAAR) 50 MG tablet   norethindrone-ethinyl estradiol (MICROGESTIN) 1-20 MG-MCG tablet   nystatin (MYCOSTATIN/NYSTOP) powder   predniSONE (DELTASONE) 50 MG tablet   Probiotic CHEW   spironolactone (ALDACTONE) 25 MG tablet   torsemide (DEMADEX) 20 MG tablet   triamcinolone cream (KENALOG) 0.1 %   No current facility-administered medications for this encounter.   Marcille Blanco [facility] Short Stay/Anesthesiology [facility] Phone [phone] 01/14/2023 9:43 AM

## 2023-01-14 NOTE — Anesthesia Preprocedure Evaluation (Signed)
Anesthesia Evaluation  Patient identified by MRN, date of birth, ID band Patient awake    Reviewed: Allergy & Precautions, NPO status , Patient's Chart, lab work & pertinent test results  Airway Mallampati: II  TM Distance: >3 FB Neck ROM: Full    Dental  (+) Dental Advisory Given   Pulmonary neg pulmonary ROS   breath sounds clear to auscultation       Cardiovascular hypertension, Pt. on medications and Pt. on home beta blockers +CHF  + dysrhythmias Atrial Fibrillation  Rhythm:Regular Rate:Normal     Neuro/Psych negative neurological ROS     GI/Hepatic negative GI ROS, Neg liver ROS,,,  Endo/Other  negative endocrine ROS    Renal/GU Renal InsufficiencyRenal disease     Musculoskeletal  (+) Arthritis ,    Abdominal   Peds  Hematology  (+) Blood dyscrasia, anemia   Anesthesia Other Findings   Reproductive/Obstetrics                             Anesthesia Physical Anesthesia Plan  ASA: 3  Anesthesia Plan: General   Post-op Pain Management: Tylenol PO (pre-op)*   Induction: Intravenous  PONV Risk Score and Plan: 3 and Dexamethasone, Ondansetron, Midazolam and Treatment may vary due to age or medical condition  Airway Management Planned: Oral ETT  Additional Equipment:   Intra-op Plan:   Post-operative Plan: Extubation in OR  Informed Consent: I have reviewed the patients History and Physical, chart, labs and discussed the procedure including the risks, benefits and alternatives for the proposed anesthesia with the patient or authorized representative who has indicated his/her understanding and acceptance.     Dental advisory given  Plan Discussed with: CRNA  Anesthesia Plan Comments: ( )        Anesthesia Quick Evaluation

## 2023-01-16 ENCOUNTER — Encounter (HOSPITAL_COMMUNITY): Payer: Self-pay | Admitting: Surgery

## 2023-01-16 NOTE — H&P (Signed)
PROVIDER: Jamariya Davidoff Myra Rude, MD   Chief Complaint: Follow-up (Primary hyperparathyroidism)  History of Present Illness:  Patient returns for follow-up of primary hyperparathyroidism. Patient was evaluated last year at the request of Dr. Lesly Dukes. Patient had to delay surgical intervention as she underwent an ablation procedure for atrial fibrillation. This has been successful. She is off of some of her cardiac medicines including amiodarone. She remains on Eliquis. Patient returns today to review her results from her ultrasound and her nuclear medicine parathyroid scan. 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 This was confirmed on nuclear medicine parathyroid scan and also this study raised the possibility of a right inferior adenoma. Therefore at the time of surgery we will need to do an exploration of both the left and the right neck for possibly multi gland disease. Recent laboratory studies show a persistently elevated calcium of 11.3 and an elevated intact PTH level of 77. Patient presents today to discuss surgery.  Review of Systems: A complete review of systems was obtained from the patient. I have reviewed this information and discussed as appropriate with the patient. See HPI as well for other ROS.  Review of Systems Constitutional: Positive for malaise/fatigue. HENT: Negative. Eyes: Negative. Respiratory: Negative. Cardiovascular: Positive for palpitations. Gastrointestinal: Negative. Genitourinary: Negative. Musculoskeletal: Positive for joint pain. Skin: Negative. Neurological: Negative. Endo/Heme/Allergies: Negative. Psychiatric/Behavioral: Negative.   Medical History: Past Medical History: Diagnosis Date Anemia Arrhythmia Arthritis CHF (congestive heart failure) (CMS/HHS-HCC) Hypertension Thyroid disease  Patient Active Problem List Diagnosis Hyperparathyroidism (CMS/HHS-HCC)  Past Surgical  History: Procedure Laterality Date LAPAROSCOPIC TUBAL LIGATION 05/28/2000 Right/left heart cath and coronary angiography 05/11/2021 Tee without cardioversion 05/12/2021 CARDIOVERSION EXTERNAL 05/12/2021 CARDIOVERSION EXTERNAL 05/29/2021 Atrial fibrillation ablation 08/03/2022 COLOSTOMY   Allergies Allergen Reactions Lisinopril Anaphylaxis Other reaction(s): Angioedema  Current Outpatient Medications on File Prior to Visit Medication Sig Dispense Refill KLOR-CON M20 20 mEq ER tablet TAKE 1 TABLET BY MOUTH AS NEEDED( TAKE WHEN TAKING TORSEMIDE) TORsemide (DEMADEX) 20 MG tablet TAKE 1 TABLET BY MOUTH AS NEEDED(WHEN WEIGHT IS UP OR NOTES SWELLING) AMIOdarone (PACERONE) 200 MG tablet Take 200 mg by mouth once daily carvediloL (COREG) 6.25 MG tablet Take 1 tablet by mouth 2 (two) times daily cholecalciferol, vitamin D3, 10 mcg (400 unit) Cap Take 1 capsule by mouth once daily ELIQUIS 5 mg tablet Take 5 mg by mouth 2 (two) times daily FARXIGA 10 mg tablet Take 10 mg by mouth once daily hydrALAZINE (APRESOLINE) 50 MG tablet Take 50 mg by mouth 3 (three) times daily isosorbide mononitrate (IMDUR) 30 MG ER tablet Take 1 tablet by mouth once daily losartan (COZAAR) 50 MG tablet TAKE 1 TABLET (50 MG) BY MOUTH IN THE MORNING AND AT BEDTIME spironolactone (ALDACTONE) 25 MG tablet Take 25 mg by mouth once daily  No current facility-administered medications on file prior to visit.  Family History Problem Relation Age of Onset Stroke Father High blood pressure (Hypertension) Father Obesity Sister Diabetes Sister High blood pressure (Hypertension) Brother   Social History  Tobacco Use Smoking Status Never Smokeless Tobacco Never   Social History  Socioeconomic History Marital status: Single Tobacco Use Smoking status: Never Smokeless tobacco: Never Substance and Sexual Activity Alcohol use: Never Drug use: Never  Social Determinants of Health  Food Insecurity: No Food  Insecurity (05/31/2021) Received from [facility] [place] Hunger Vital Sign Worried About Running Out of Food in the Last Year: Never true Ran Out of Food in the Last Year:  Never true Transportation Needs: No Transportation Needs (05/31/2021) Received from [facility] - Transportation Lack of Transportation (Medical): No Lack of Transportation (Non-Medical): No  Objective:  Vitals: 12/14/22 1628 PainSc: 2  There is no height or weight on file to calculate BMI.  Physical Exam  GENERAL APPEARANCE Comfortable, no acute issues Development: normal Gross deformities: none  SKIN Rash, lesions, ulcers: none Induration, erythema: none Nodules: none palpable  EYES Conjunctiva and lids: normal Pupils: equal and reactive  EARS, NOSE, MOUTH, THROAT External ears: no lesion or deformity External nose: no lesion or deformity Hearing: grossly normal  NECK Symmetric: yes Trachea: midline Thyroid: no palpable nodules in the thyroid bed  ABDOMEN Not assessed  GENITOURINARY/RECTAL Not assessed  MUSCULOSKELETAL Station and gait: normal Digits and nails: no clubbing or cyanosis Muscle strength: grossly normal all extremities Range of motion: grossly normal all extremities Deformity: none  LYMPHATIC Cervical: none palpable Supraclavicular: none palpable  PSYCHIATRIC Oriented to person, place, and time: yes Mood and affect: normal for situation Judgment and insight: appropriate for situation   Assessment and Plan:  Hyperparathyroidism (CMS/HHS-HCC)  Patient returns for surgical follow-up having originally been referred by Dr. Lesly Dukes for surgical management of primary hyperparathyroidism.  Today we reviewed her ultrasound report and her nuclear medicine parathyroid scan. We reviewed her recent laboratory studies. We discussed proceeding with minimally invasive parathyroidectomy. This would be done through a relatively centrally  located anterior cervical incision allowing for removal of what appears to be a parathyroid adenoma on the left side as well as exploration of the right side to rule out a possible right inferior adenoma representing multi gland disease. We discussed the hospital stay to be anticipated. We discussed doing this as an outpatient surgical procedure. We discussed the size and location of the incision. We discussed her postoperative recovery and return to activities. We discussed discontinuing her Eliquis for 3 days prior to surgery and starting it back the day following her procedure. The patient understands and agrees to proceed.  We will contact her cardiologist for cardiac clearance prior to the surgical date.  Darnell Level, MD [facility] A [facility] practice Office: [phone]

## 2023-01-17 ENCOUNTER — Ambulatory Visit (HOSPITAL_COMMUNITY): Payer: Medicaid Other | Admitting: Medical

## 2023-01-17 ENCOUNTER — Other Ambulatory Visit: Payer: Self-pay

## 2023-01-17 ENCOUNTER — Ambulatory Visit (HOSPITAL_BASED_OUTPATIENT_CLINIC_OR_DEPARTMENT_OTHER): Payer: Medicaid Other | Admitting: Anesthesiology

## 2023-01-17 ENCOUNTER — Encounter (HOSPITAL_COMMUNITY)
Admission: RE | Disposition: A | Discharge status: Home / Self Care | Payer: Self-pay | Source: Home / Self Care | Attending: Surgery

## 2023-01-17 ENCOUNTER — Ambulatory Visit (HOSPITAL_COMMUNITY): Admission: RE | Admit: 2023-01-17 | Payer: Medicaid Other | Source: Home / Self Care | Admitting: Surgery

## 2023-01-17 ENCOUNTER — Encounter (HOSPITAL_COMMUNITY): Payer: Self-pay | Admitting: Surgery

## 2023-01-17 DIAGNOSIS — I129 Hypertensive chronic kidney disease with stage 1 through stage 4 chronic kidney disease, or unspecified chronic kidney disease: Secondary | ICD-10-CM

## 2023-01-17 DIAGNOSIS — I110 Hypertensive heart disease with heart failure: Secondary | ICD-10-CM | POA: Insufficient documentation

## 2023-01-17 DIAGNOSIS — E210 Primary hyperparathyroidism: Secondary | ICD-10-CM | POA: Insufficient documentation

## 2023-01-17 DIAGNOSIS — Z7901 Long term (current) use of anticoagulants: Secondary | ICD-10-CM | POA: Insufficient documentation

## 2023-01-17 DIAGNOSIS — D351 Benign neoplasm of parathyroid gland: Secondary | ICD-10-CM | POA: Diagnosis not present

## 2023-01-17 DIAGNOSIS — I509 Heart failure, unspecified: Secondary | ICD-10-CM | POA: Diagnosis not present

## 2023-01-17 DIAGNOSIS — Z6839 Body mass index (BMI) 39.0-39.9, adult: Secondary | ICD-10-CM

## 2023-01-17 DIAGNOSIS — I4891 Unspecified atrial fibrillation: Secondary | ICD-10-CM | POA: Insufficient documentation

## 2023-01-17 DIAGNOSIS — E213 Hyperparathyroidism, unspecified: Secondary | ICD-10-CM | POA: Diagnosis not present

## 2023-01-17 DIAGNOSIS — D649 Anemia, unspecified: Secondary | ICD-10-CM | POA: Diagnosis not present

## 2023-01-17 DIAGNOSIS — I5022 Chronic systolic (congestive) heart failure: Secondary | ICD-10-CM

## 2023-01-17 HISTORY — PX: PARATHYROIDECTOMY: SHX19

## 2023-01-17 SURGERY — PARATHYROIDECTOMY
Anesthesia: General

## 2023-01-17 MED ORDER — 0.9 % SODIUM CHLORIDE (POUR BTL) OPTIME
TOPICAL | Status: DC | PRN
  Administered 2023-01-17: 1000 mL

## 2023-01-17 MED ORDER — CHLORHEXIDINE GLUCONATE 0.12 % MT SOLN
15.0000 mL | Freq: Once | OROMUCOSAL | Status: AC
  Administered 2023-01-17: 15 mL via OROMUCOSAL

## 2023-01-17 MED ORDER — SUGAMMADEX SODIUM 200 MG/2ML IV SOLN
INTRAVENOUS | Status: DC | PRN
  Administered 2023-01-17: 200 mg via INTRAVENOUS

## 2023-01-17 MED ORDER — CHLORHEXIDINE GLUCONATE CLOTH 2 % EX PADS: 6.0000 | MEDICATED_PAD | Freq: Once | CUTANEOUS | Status: DC

## 2023-01-17 MED ORDER — HEMOSTATIC AGENTS (NO CHARGE) OPTIME
TOPICAL | Status: DC | PRN
  Administered 2023-01-17: 1 via TOPICAL

## 2023-01-17 MED ORDER — LIDOCAINE HCL (CARDIAC) PF 100 MG/5ML IV SOSY
PREFILLED_SYRINGE | INTRAVENOUS | Status: DC | PRN
  Administered 2023-01-17: 80 mg via INTRAVENOUS

## 2023-01-17 MED ORDER — AMISULPRIDE (ANTIEMETIC) 5 MG/2ML IV SOLN: 10.0000 mg | Freq: Once | INTRAVENOUS | Status: DC | PRN

## 2023-01-17 MED ORDER — ONDANSETRON HCL 4 MG/2ML IJ SOLN
INTRAMUSCULAR | Status: AC
  Filled 2023-01-17: qty 2

## 2023-01-17 MED ORDER — TRAMADOL HCL 50 MG PO TABS: 50.0000 mg | ORAL_TABLET | Freq: Four times a day (QID) | ORAL | 0 refills | Status: DC | PRN

## 2023-01-17 MED ORDER — ONDANSETRON HCL 4 MG/2ML IJ SOLN
INTRAMUSCULAR | Status: DC | PRN
  Administered 2023-01-17: 4 mg via INTRAVENOUS

## 2023-01-17 MED ORDER — DEXAMETHASONE SODIUM PHOSPHATE 4 MG/ML IJ SOLN
INTRAMUSCULAR | Status: DC | PRN
  Administered 2023-01-17: 5 mg via INTRAVENOUS

## 2023-01-17 MED ORDER — BUPIVACAINE HCL 0.25 % IJ SOLN
INTRAMUSCULAR | Status: AC
  Filled 2023-01-17: qty 1

## 2023-01-17 MED ORDER — PROPOFOL 10 MG/ML IV BOLUS
INTRAVENOUS | Status: AC
  Filled 2023-01-17: qty 20

## 2023-01-17 MED ORDER — BUPIVACAINE HCL 0.25 % IJ SOLN
INTRAMUSCULAR | Status: DC | PRN
  Administered 2023-01-17: 10 mL

## 2023-01-17 MED ORDER — FENTANYL CITRATE (PF) 100 MCG/2ML IJ SOLN
INTRAMUSCULAR | Status: AC
  Filled 2023-01-17: qty 2

## 2023-01-17 MED ORDER — DEXAMETHASONE SODIUM PHOSPHATE 10 MG/ML IJ SOLN
INTRAMUSCULAR | Status: AC
  Filled 2023-01-17: qty 1

## 2023-01-17 MED ORDER — CEFAZOLIN SODIUM-DEXTROSE 2-4 GM/100ML-% IV SOLN
2.0000 g | INTRAVENOUS | Status: AC
  Administered 2023-01-17: 2 g via INTRAVENOUS
  Filled 2023-01-17: qty 100

## 2023-01-17 MED ORDER — ROCURONIUM BROMIDE 100 MG/10ML IV SOLN
INTRAVENOUS | Status: DC | PRN
  Administered 2023-01-17: 10 mg via INTRAVENOUS
  Administered 2023-01-17: 60 mg via INTRAVENOUS

## 2023-01-17 MED ORDER — FENTANYL CITRATE PF 50 MCG/ML IJ SOSY
25.0000 ug | PREFILLED_SYRINGE | INTRAMUSCULAR | Status: DC | PRN
  Administered 2023-01-17: 50 ug via INTRAVENOUS

## 2023-01-17 MED ORDER — LACTATED RINGERS IV SOLN: INTRAVENOUS | Status: DC | PRN

## 2023-01-17 MED ORDER — ORAL CARE MOUTH RINSE: 15.0000 mL | Freq: Once | OROMUCOSAL | Status: AC

## 2023-01-17 MED ORDER — FENTANYL CITRATE (PF) 100 MCG/2ML IJ SOLN
INTRAMUSCULAR | Status: DC | PRN
  Administered 2023-01-17: 100 ug via INTRAVENOUS
  Administered 2023-01-17: 50 ug via INTRAVENOUS

## 2023-01-17 MED ORDER — MIDAZOLAM HCL 2 MG/2ML IJ SOLN
INTRAMUSCULAR | Status: AC
  Filled 2023-01-17: qty 2

## 2023-01-17 MED ORDER — FENTANYL CITRATE PF 50 MCG/ML IJ SOSY
PREFILLED_SYRINGE | INTRAMUSCULAR | Status: AC
  Filled 2023-01-17: qty 1

## 2023-01-17 MED ORDER — OXYCODONE HCL 5 MG PO TABS
ORAL_TABLET | ORAL | Status: AC
  Filled 2023-01-17: qty 1

## 2023-01-17 MED ORDER — ACETAMINOPHEN 500 MG PO TABS
1000.0000 mg | ORAL_TABLET | Freq: Once | ORAL | Status: AC
  Administered 2023-01-17: 1000 mg via ORAL
  Filled 2023-01-17: qty 2

## 2023-01-17 MED ORDER — MIDAZOLAM HCL 5 MG/5ML IJ SOLN
INTRAMUSCULAR | Status: DC | PRN
  Administered 2023-01-17: 2 mg via INTRAVENOUS

## 2023-01-17 MED ORDER — LACTATED RINGERS IV SOLN: INTRAVENOUS | Status: DC

## 2023-01-17 MED ORDER — OXYCODONE HCL 5 MG PO TABS
5.0000 mg | ORAL_TABLET | Freq: Once | ORAL | Status: AC
  Administered 2023-01-17: 5 mg via ORAL

## 2023-01-17 MED ORDER — PROPOFOL 10 MG/ML IV BOLUS
INTRAVENOUS | Status: DC | PRN
  Administered 2023-01-17: 200 mg via INTRAVENOUS

## 2023-01-17 SURGICAL SUPPLY — 35 items

## 2023-01-17 NOTE — Discharge Instructions (Addendum)
[facility] - Dr. Todd Gerkin  THYROID & PARATHYROID SURGERY:  POST-OP INSTRUCTIONS  Always review the instruction sheet provided by the hospital nurse at discharge.  A prescription for pain medication may be sent to your pharmacy at the time of discharge.  Take your pain medication as prescribed.  If narcotic pain medicine is not needed, then you may take acetaminophen (Tylenol) or ibuprofen (Advil) as needed for pain or soreness.  Take your normal home medications as prescribed unless otherwise directed.  If you need a refill on your pain medication, please contact the office during regular business hours.  Prescriptions will not be processed by the office after 5:00PM or on weekends.  Start with a light diet upon arrival home, such as soup and crackers or toast.  Be sure to drink plenty of fluids.  Resume your normal diet the day after surgery.  Most patients will experience some swelling and bruising on the chest and neck area.  Ice packs will help for the first 48 hours after arriving home.  Swelling and bruising will take several days to resolve.   It is common to experience some constipation after surgery.  Increasing fluid intake and taking a stool softener (Colace) will usually help to prevent this problem.  A mild laxative (Milk of Magnesia or Miralax) should be taken according to package directions if there has been no bowel movement after 48 hours.  Dermabond glue covers your incision. This seals the wound and you may shower at any time. The Dermabond will remain in place for about a week.  You may gradually remove the glue when it loosens around the edges.  If you need to loosen the Dermabond for removal, apply a layer of Vaseline to the wound for 15 minutes and then remove with a Kleenex. Your sutures are under the skin and will not show - they will dissolve on their own.  You may resume light daily activities beginning the day after discharge (such as self-care,  walking, climbing stairs), gradually increasing activities as tolerated. You may have sexual intercourse when it is comfortable. Refrain from any heavy lifting or straining until approved by your doctor. You may drive when you no longer are taking prescription pain medication, you can comfortably wear a seatbelt, and you can safely maneuver your car and apply the brakes.  You will see your doctor in the office for a follow-up appointment approximately three weeks after your surgery.  Make sure that you call for this appointment within a day or two after you arrive home to insure a convenient appointment time. Please have any requested laboratory tests performed a few days prior to your office visit so that the results will be available at your follow up appointment.  WHEN TO CALL THE [facility] OFFICE: -- Fever greater than 101.5 -- Inability to urinate -- Nausea and/or vomiting - persistent -- Extreme swelling or bruising -- Continued bleeding from incision -- Increased pain, redness, or drainage from the incision -- Difficulty swallowing or breathing -- Muscle cramping or spasms -- Numbness or tingling in hands or around lips  The clinic staff is available to answer your questions during regular business hours.  Please don't hesitate to call and ask to speak to one of the nurses if you have concerns.  [facility] OFFICE: [phone] (24 hours)  Please sign up for MyChart accounts. This will allow you to communicate directly with my nurse or myself without having to call the office. It will also allow you   to view your test results. You will need to enroll in MyChart for my office ([facility]) and for the hospital ([place]).  Todd Gerkin, MD [facility] A [facility] practice 

## 2023-01-17 NOTE — Op Note (Signed)
Operative Note  Pre-operative Diagnosis:  primary hyperparathyroidism  Post-operative Diagnosis:  same  Surgeon:  Darnell Level, MD  Assistant:  none   Procedure:  Neck exploration, left superior parathyroidectomy, right inferior parathyroidectomy  Anesthesia:  general  Estimated Blood Loss:  15 cc  Drains: none          Specimen: to pathology  Indications:  Patient returns for follow-up of primary hyperparathyroidism. Patient was evaluated last year at the request of Dr. Lesly Dukes. Patient had to delay surgical intervention as she underwent an ablation procedure for atrial fibrillation. This has been successful. She is off of some of her cardiac medicines including amiodarone. She remains on Eliquis. Patient returns today to review her results from her ultrasound and her nuclear medicine parathyroid scan. 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 This was confirmed on nuclear medicine parathyroid scan and also this study raised the possibility of a right inferior adenoma. Therefore at the time of surgery we will need to do an exploration of both the left and the right neck for possibly multi gland disease. Recent laboratory studies show a persistently elevated calcium of 11.3 and an elevated intact PTH level of 77. Patient presents today to discuss surgery.   Procedure:  The patient was seen in the pre-op holding area. The risks, benefits, complications, treatment options, and expected outcomes were previously discussed with the patient. The patient agreed with the proposed plan and has signed the informed consent form.  The patient was brought to the operating room by the surgical team, identified as Christine Oneal and the procedure verified. A "time out" was completed and the above information confirmed.  Following induction of general anesthesia, the patient was positioned and then prepped and draped in usual aseptic fashion.  After  ascertaining that an adequate level of anesthesia been achieved, a small centrally located Kocher incision is made with a #15 blade.  Dissection is carried through subcutaneous tissues and platysma.  Skin flaps were elevated cephalad and caudad.  A self-retaining retractors placed for exposure.  Strap muscles are incised in the midline.  Dissection has begun on the left side.  Left thyroid lobe was mobilized.  Venous tributaries are divided between ligaclips.  Gland was rolled anteriorly.  A normal left inferior parathyroid gland is identified.  Further mobilization of the left thyroid lobe revealed an enlarged left superior parathyroid gland.  This gland measured at least 2 cm in greatest dimension.  It was posterior to the left superior pole of the thyroid.  It was gently dissected out.  Vascular structures were divided between small ligaclips.  The gland was completely resected.  It was submitted to pathology where frozen section confirmed hypercellular parathyroid tissue consistent with adenoma.  Good hemostasis was noted in the operative field.  Next the right side was mobilized by reflecting the strap muscles laterally.  Right thyroid lobe was dissected out.  Venous tributaries were divided between ligaclips.  Exploration revealed an enlarged right inferior parathyroid gland.  This was extending from the thyroid posteriorly.  It was gently mobilized.  Vascular structures were divided between small ligaclips.  The gland was elevated away from the underlying recurrent laryngeal nerve.  The gland was completely resected.  It measured approximately 1.5 cm in greatest dimension.  It was submitted to pathology where frozen section confirmed hypercellular parathyroid tissue consistent with adenoma.  Good hemostasis was noted in the operative field.  Further exploration on the right side failed to  reveal any other enlarged parathyroid tissue.  It did appear that there was a normal right superior gland just above  the inferior thyroid artery.  Operative field was inspected for hemostasis.  Fibrillar was placed throughout the operative field.  Strap muscles were reapproximated in the midline with interrupted 3-0 Vicryl sutures.  Platysma was closed with interrupted 3-0 Vicryl sutures.  Skin was anesthetized with local anesthetic.  Skin edges are reapproximated with a running 4-0 Monocryl subcuticular suture.  Wound was washed and dried and Dermabond is applied as dressing.  Patient is awakened from anesthesia and transported to the recovery room in stable condition.  The patient tolerated the procedure well.   Darnell Level, MD [facility] Office: [phone]

## 2023-01-17 NOTE — Transfer of Care (Signed)
Immediate Anesthesia Transfer of Care Note  Patient: Christine Oneal  Procedure(s) Performed: NECK EXPLORATION WITH PARATHYROIDECTOMY  Patient Location: PACU  Anesthesia Type:General  Level of Consciousness: awake and alert   Airway & Oxygen Therapy: Patient Spontanous Breathing and Patient connected to face mask oxygen  Post-op Assessment: Report given to RN and Post -op Vital signs reviewed and stable  Post vital signs: Reviewed and stable  Last Vitals:  Vitals Value Taken Time  BP 129/78 01/17/23 1038  Temp    Pulse 60 01/17/23 1041  Resp 18 01/17/23 1041  SpO2 100 % 01/17/23 1041  Vitals shown include unvalidated device data.  Last Pain:  Vitals:   01/17/23 0758  TempSrc:   PainSc: 0-No pain         Complications: No notable events documented.

## 2023-01-17 NOTE — Anesthesia Procedure Notes (Signed)
Procedure Name: Intubation Date/Time: 01/17/2023 9:18 AM  Performed by: Deri Fuelling, CRNAPre-anesthesia Checklist: Patient identified, Emergency Drugs available, Suction available and Patient being monitored Patient Re-evaluated:Patient Re-evaluated prior to induction Oxygen Delivery Method: Circle system utilized Preoxygenation: Pre-oxygenation with 100% oxygen Induction Type: IV induction Ventilation: Mask ventilation without difficulty Laryngoscope Size: Glidescope and 3 Grade View: Grade I Tube type: Oral Tube size: 7.0 mm Number of attempts: 1 Airway Equipment and Method: Stylet and Oral airway Placement Confirmation: ETT inserted through vocal cords under direct vision, positive ETCO2 and breath sounds checked- equal and bilateral Secured at: 21 cm Tube secured with: Tape Dental Injury: Teeth and Oropharynx as per pre-operative assessment

## 2023-01-18 ENCOUNTER — Ambulatory Visit: Payer: Medicaid Other | Admitting: Physical Therapy

## 2023-01-18 ENCOUNTER — Encounter (HOSPITAL_COMMUNITY): Payer: Self-pay | Admitting: Surgery

## 2023-01-18 NOTE — Anesthesia Postprocedure Evaluation (Signed)
Anesthesia Post Note  Patient: Christine Oneal  Procedure(s) Performed: NECK EXPLORATION WITH PARATHYROIDECTOMY     Patient location during evaluation: PACU Anesthesia Type: General Level of consciousness: awake and alert Pain management: pain level controlled Vital Signs Assessment: post-procedure vital signs reviewed and stable Respiratory status: spontaneous breathing, nonlabored ventilation, respiratory function stable and patient connected to nasal cannula oxygen Cardiovascular status: blood pressure returned to baseline and stable Postop Assessment: no apparent nausea or vomiting Anesthetic complications: no   No notable events documented.  Last Vitals:  Vitals:   01/17/23 1130 01/17/23 1136  BP: (!) 116/57   Pulse: (!) 55 (!) 55  Resp: 18 18  Temp: 36.4 C   SpO2: 99% 99%    Last Pain:  Vitals:   01/17/23 1136  TempSrc:   PainSc: 4                  Kennieth Rad

## 2023-01-20 ENCOUNTER — Ambulatory Visit: Payer: Medicaid Other | Admitting: Physical Therapy

## 2023-01-25 ENCOUNTER — Telehealth: Payer: Self-pay | Admitting: Internal Medicine

## 2023-01-25 ENCOUNTER — Ambulatory Visit: Payer: Medicaid Other | Admitting: Physical Therapy

## 2023-01-25 NOTE — Telephone Encounter (Signed)
We tried faxing paper work back to [employer] they gave [place] 2 more numbers to try and the forms was for adult diapers.  Fax numbers [phone]

## 2023-01-27 ENCOUNTER — Ambulatory Visit: Payer: Medicaid Other | Admitting: Physical Therapy

## 2023-01-27 ENCOUNTER — Encounter: Payer: Self-pay | Admitting: Physical Therapy

## 2023-01-27 DIAGNOSIS — G8929 Other chronic pain: Secondary | ICD-10-CM

## 2023-01-27 DIAGNOSIS — M25551 Pain in right hip: Secondary | ICD-10-CM

## 2023-01-27 DIAGNOSIS — M6281 Muscle weakness (generalized): Secondary | ICD-10-CM | POA: Diagnosis not present

## 2023-01-27 DIAGNOSIS — M25561 Pain in right knee: Secondary | ICD-10-CM | POA: Diagnosis not present

## 2023-01-27 DIAGNOSIS — R2689 Other abnormalities of gait and mobility: Secondary | ICD-10-CM | POA: Diagnosis not present

## 2023-01-27 DIAGNOSIS — M25562 Pain in left knee: Secondary | ICD-10-CM | POA: Diagnosis not present

## 2023-01-27 NOTE — Therapy (Signed)
OUTPATIENT PHYSICAL THERAPY TREATMENT NOTE   Patient Name: Christine Oneal MRN: [id number] DOB:09/17/1970, 52 y.o., female Today's Date: 01/27/2023  PCP: Myrlene Broker, MD REFERRING PROVIDER: Rodolph Bong, MD   END OF SESSION:   PT End of Session - 01/27/23 1019     Visit Number 12    Number of Visits 22    Date for PT Re-Evaluation 02/18/23    Authorization Type [employer]    Authorization Time Period 9 visits 01/10/23-03/10/23    Authorization - Visit Number 2    Authorization - Number of Visits 9    PT Start Time 1018    PT Stop Time 1100    PT Time Calculation (min) 42 min               Past Medical History:  Diagnosis Date   Abnormal Pap smear of cervix    Allergy    Anemia    Arthritis    CHF (congestive heart failure) (HCC)    Dysrhythmia    A fib   Family history of adverse reaction to anesthesia    Fibroid    History of atrial fibrillation    History of kidney stones    Hyperparathyroidism (HCC)    Hypertension    Leukocytosis, unspecified 10/18/2013   Obesity    Plantar fasciitis    Pneumonia    Past Surgical History:  Procedure Laterality Date   ATRIAL FIBRILLATION ABLATION N/A 08/03/2022   Procedure: ATRIAL FIBRILLATION ABLATION;  Surgeon: Lanier Prude, MD;  Location: [facility]IVE [facility];  Service: Cardiovascular;  Laterality: N/A;   CARDIOVERSION N/A 05/12/2021   Procedure: CARDIOVERSION;  Surgeon: Dolores Patty, MD;  Location: [facility] ENDOSCOPY;  Service: Cardiovascular;  Laterality: N/A;   CARDIOVERSION N/A 05/29/2021   Procedure: CARDIOVERSION;  Surgeon: Dolores Patty, MD;  Location: [facility] ENDOSCOPY;  Service: Cardiovascular;  Laterality: N/A;   COLPOSCOPY     PARATHYROIDECTOMY N/A 01/17/2023   Procedure: NECK EXPLORATION WITH PARATHYROIDECTOMY;  Surgeon: Darnell Level, MD;  Location: [facility] ORS;  Service: General;  Laterality: N/A;   RIGHT/LEFT HEART CATH AND CORONARY ANGIOGRAPHY N/A 05/11/2021   Procedure: RIGHT/LEFT  HEART CATH AND CORONARY ANGIOGRAPHY;  Surgeon: Dolores Patty, MD;  Location: [facility] CV [facility];  Service: Cardiovascular;  Laterality: N/A;   TEE WITHOUT CARDIOVERSION N/A 05/12/2021   Procedure: TRANSESOPHAGEAL ECHOCARDIOGRAM (TEE);  Surgeon: Dolores Patty, MD;  Location: [facility] ENDOSCOPY;  Service: Cardiovascular;  Laterality: N/A;   TUBAL LIGATION  2001   Patient Active Problem List   Diagnosis Date Noted   Right foot pain 12/09/2022   Urinary incontinence 08/23/2022   Chronic pain of both knees 08/23/2022   Gait instability 08/23/2022   Dysmenorrhea 05/14/2022   Left foot pain 05/14/2022   Encounter for general adult medical examination with abnormal findings 04/17/2022   Primary hyperparathyroidism (HCC) 11/25/2021   Chronic systolic CHF (congestive heart failure) (HCC) 05/26/2021   Unspecified atrial fibrillation (HCC) 05/04/2021   Anemia 09/04/2017   Morbid obesity (HCC) 09/03/2017   Reactive airway disease 09/03/2017   Vitamin D deficiency 09/03/2017   Essential hypertension 10/24/2007   Hemorrhoids 10/24/2007    REFERRING DIAG: M25.561,M25.562,G89.29 (ICD-10-CM) - Chronic pain of both knees M25.551 (ICD-10-CM) - Right hip pain (added 12/02/22)  THERAPY DIAG:  Chronic pain of both knees  Muscle weakness (generalized)  Other abnormalities of gait and mobility  Pain in right hip  Rationale for Evaluation and Treatment Rehabilitation  PERTINENT HISTORY: HTN,  CHF, afib (pt states well controlled since ablation in January although does have some exertional SOB with activity)  PRECAUTIONS: fall hx, cardiac hx    SUBJECTIVE:                                                                                                                                                                                     SUBJECTIVE STATEMENT:   Knees are okay. Right hip is still sore with walking a lot or lying on that side. Left one is not as bad.    PAIN:  Are you having  pain: 0/10 (worst 5-6/10) Location/description: right lateral hip to knee  Aggravating factors: stair navigation, standing/walking >46min, sleeping R side (wakes up 3x/night on average), sitting >38min Easing factors: rest, changing positions, medication, voltaren     OBJECTIVE: (objective measures completed at initial evaluation unless otherwise dated) DIAGNOSTIC FINDINGS:  Lumbar XR 10/22/22 IMPRESSION: 1. 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 2. Right kidney stone.   BIL knee XR February 2024 with degenerative changes, see EPIC for details  Hip/pelvis XR 11/26/22  IMPRESSION: Degenerative changes lower lumbar spine and both hips. No acute bony or joint abnormality.   PATIENT SURVEYS:  LEFS eval - 43/80 12/02/22: 34/80 12/30/22: 43/80  01/07/2023: 30/80  PALPATION: Concordant tenderness B lat quads and TFL, no pain with gentle patellar mobs in all directions, no joint line or tendon tenderness    LOWER EXTREMITY ROM:      Active  Right eval Left eval Right 11/30/22 Left 11/30/22 R/L 12-16-22  Hip flexion         Hip extension         Hip internal rotation         Hip external rotation         Knee extension 0 0     Knee flexion 120 deg s 110 deg painless 120 114 120/120  (Blank rows = not tested) (Key: WFL = within functional limits not formally assessed, * = concordant pain, s = stiffness/stretching sensation, NT = not tested)  Comments:    LOWER EXTREMITY MMT:     MMT Right eval Left eval R/L 12/02/22 Rt / Lt 01/07/2023 Rt/LT 01/27/23  Hip flexion 4 3+ 4/4+ 4 / 4   Hip abduction (modified sitting) 4 4 5/5    Hip internal rotation     3/3+ 4- / 4 4 / 4  Hip external rotation     3+/4 4- / 4 4/ 4  Knee flexion 4+ 4+     Knee extension 4 *  4+  4+ / 5   Ankle dorsiflexion           (  Blank rows = not tested) (Key: WFL = within functional limits not formally assessed, * = concordant pain, s = stiffness/stretching sensation, NT = not tested)   Comments:     FUNCTIONAL TESTS:  5xSTS: 14.66 sec w UE support from standard chair no increase in pain 12/02/22 5xSTS: 12.28 sec gentle UE support at thighs, hip pain   GAIT: Distance walked: within clinic Assistive device utilized: None Level of assistance: Complete Independence Comments: reduced gait speed/cadence, widened BOS, reduced trunk rotation/arm swing   HIP EXAM 12/02/22: LLE negative FABER, FADDIR, distraction/compression RLE positive FADDIR for groin pain, FABER and compression irritate SI, distraction unrelieving       TODAY'S TREATMENT: [facility] Adult PT Treatment:                                                DATE: 01/27/23 Therapeutic Exercise: Nustep L5 x 5 min Standing hip abduction 10 x 2 each  Standing hip flexion alternating 10 x 2 each  Standing heel raise  Tandem and SLS on airex ( 20-30 sec SLS today)  STS , squat taps from mat x 12 Bridge x 12 S/L clam AROM  x15 each SLR x 10  each  Supine march with green band  Supine clam green  Ball squeeze  Bridge with ball squeeze     [facility] Adult PT Treatment:                                                DATE: 01/11/23 Therapeutic Exercise: Nu step L7 UE/LE  STS BW x12, 10# x8, 15# x5, 10# x8, BW x12 SLR attempt w/ and w/o contralat ball, discontinued after a couple reps due to tailbone pain Quad set with heel prop x12 BIL LE cues for quad contraction Hooklying adductor iso x12 cues for comfortable contraction Hooklying swiss ball press down x12 cues for breath control  6 inch fwd step ups x10 BIL LE    [facility] Adult PT Treatment:                                                DATE: 01/07/2023 Therapeutic Exercise: NuStep L5 x 5 min with UE/LE while taking subjective Sit to stand 2 x 10 SLR x 10 each, greater difficulty on right due to report of left tailbone pain Bridge x 10 Hooklying clamshell with green x 10 Forward 6" step-up x 10 each Manual Therapy: Skilled palpation and monitoring of muscle  tension while performing TPDN STM with roller over lateral hip, thigh, quad Trigger Point Dry Needling Treatment: Pre-treatment instruction: Patient instructed on dry needling rationale, procedures, and possible side effects including pain during treatment (achy,cramping feeling), bruising, drop of blood, lightheadedness, nausea, sweating. Patient Consent Given: Yes Education handout provided: Previously provided Muscles treated: right piriformis, glute med, TFL  Needle size and number: .30x34mm x 5 Electrical stimulation performed: No Parameters: N/A Treatment response/outcome: Twitch response elicited and Palpable decrease in muscle tension Post-treatment instructions: Patient instructed to expect possible mild to moderate muscle soreness later today and/or tomorrow. Patient instructed in methods to reduce muscle  soreness and to continue prescribed HEP. If patient was dry needled over the lung field, patient was instructed on signs and symptoms of pneumothorax and, however unlikely, to see immediate medical attention should they occur. Patient was also educated on signs and symptoms of infection and to seek medical attention should they occur. Patient verbalized understanding of these instructions and education.   [facility] Adult PT Treatment:                                                DATE: 12/30/22 Therapeutic Exercise: Nustep L5 LE only x 5 minutes 15# STS AMRAP x15 reps 6 inch step up 1 UE x 10 each  Tandem on AIREX 45 sec + x 2 SLS on AIREX 4-8 sec x 2 each  Gastroc slant board  S/L right hip clam x 10, reverse clam x 10 Supine clam green band x10 Banded bridge x 10  Ball squeeze with bridge x 10 Standing quad stretch with UE at counter  LEFS  [facility] Adult PT Treatment:                                                DATE: 12/28/22 Therapeutic Exercise: GTB IR with ball as fulcrum, 2x15 cues for pacing GTB ER with ball as fulcrum 2x15 cues for pacing 10# STS x10, 15# x8, 10# x10  cues for form and pacing Standing hip flexor mini lunge position x10 cues for comfortable ROM  Education on relevant anatomy/physiology as it pertains to exercise in session, HEP, and general exercise outside of sessions Therapeutic Activity: Time spent w/ education/discussion re: functional mobility, floor transfers, stair navigation, and strategies to progress exercises towards these goals as appropriate Fwd step ups 6 inch at counter x5 BIL, 4inch x6 BIL cues for pacing and control (particularly eccentric control on RLE)   PATIENT EDUCATION:  Education details: POC, HEP Person educated: Patient Education method: Explanation, Demonstration, Tactile cues, Verbal cues Education comprehension: verbalized understanding, returned demonstration, verbal cues required, tactile cues required, and needs further education     HOME EXERCISE PROGRAM: Access Code: [email or web address]   ASSESSMENT: CLINICAL IMPRESSION: Pt arrives w/ 2/10 hip pain, no knee pain. She underwent a thyroid procedure 10 days ago and was told to resume activity in one week, no lifting or straining. She has returned to her walking program. Continued with open and closed chain LE strength and stability. Some discomfort in side lying due to hip soreness. Able to complete all prescribed therex without complaints. Her rotational hip strength has improved. LTG# 7 met.  Will continue  along current POC in order to address relevant deficits and improve functional tolerance.   OBJECTIVE IMPAIRMENTS: decreased activity tolerance, decreased balance, decreased endurance, decreased mobility, difficulty walking, decreased ROM, decreased strength, and pain.    ACTIVITY LIMITATIONS: carrying, bending, sitting, standing, squatting, sleeping, stairs, transfers, and locomotion level   PARTICIPATION LIMITATIONS: meal prep, cleaning, laundry, community activity, and occupation   PERSONAL FACTORS: Time since onset of injury/illness/exacerbation and 3+  comorbidities: afib/CHF, HTN, MSK comorbidities (LBP)  are also affecting patient's functional outcome.      GOALS: Goals reviewed with patient? No given time constraints   SHORT TERM GOALS: Target date: = LTG  Pt will demonstrate appropriate understanding and performance of initially prescribed HEP in order to facilitate improved independence with management of symptoms.  Baseline: HEP provided on eval 11/22/22: requires cues  12/03/22: Independent with initial HEP Goal status: MET   2. Pt will score greater than or equal to 52 on LEFS in order to demonstrate improved perception of function due to symptoms.             Baseline: 43/80  11/22/22: not tested due to absence from PT  12/02/22: 34/80 12/30/22: 43/80 01/07/2023: 30/80             Goal status: ONGOING   LONG TERM GOALS: Target date: 02/18/2023  Pt will score 60 or  greater  on LEFS in order to demonstrate improved perception of function due to symptoms.  Baseline: 43/80 12/02/22: 34/80  12/30/22: 43/80 01/07/2023: 30/80 Goal status:ONGOING   2.  Pt will demonstrate at least 120 degrees of knee flexion ROM bilaterally in order to facilitate improved mechanics with gait/transfers. Baseline: see ROM chart above 12/02/22: NT given hip re-eval  12-16-22 See chart R 120, L 120 Goal status: MET   3.  Pt will demonstrate appropriate performance of final prescribed HEP in order to facilitate improved self-management of symptoms post-discharge.              Baseline: initial HEP prescribed  12/02/22: good HEP adherence reported  01/07/2023: good HEP adherence reported             Goal status: ONGOING     4.  Pt will be able to perform 5xSTS in less than or equal to 12sec without UE support in order to demonstrate reduced fall risk and improved functional independence (MCID 5xSTS = 2.3 sec). Baseline: 14.66sec w UE support 12/02/22: 12sec w gentle UE support 01/07/2023: 12 seconds without UE support Goal status: MET   5. Pt will  report at least 50% decrease in overall pain levels in past week in order to facilitate improved tolerance to basic ADLs/mobility.              Baseline: 2-7/10  12/02/22: 5-6/10 hip at worst, 1-2/10 knees at worst  12-16-22  1/10 today and just a specific pain on lateral thigh  12/30/22: knees and hip can reach 5-6/10  01/07/2023: worst 5-6/10             Goal status: PROGRESSING   6. Pt will report/demonstrate ability to stand/walk for up to 45 min with less than 3 pt increase in resting pain in order to promote improved community navigation and exercise tolerance for overall health/QOL.              Baseline: pain >109min of standing/walking  12/02/22: pain >80min in hip/groin, knees no longer limiting walking most of the time 01/07/2023: patient reports 15-20 minutes on a good day             Goal status: ONGOING  7. Pt will demo R hip ER/IR MMT of at least 4/5 in order to demonstrate improved functional strength.  Baseline: see MMT chart above  01/07/2023: 4-/5 MMT right hip ER/IR 01/27/23: 4/5  Goal status: MET     PLAN   PT FREQUENCY:  2x/week    PT DURATION: 6 weeks   PLANNED INTERVENTIONS: Therapeutic exercises, Therapeutic activity, Neuromuscular re-education, Balance training, Gait training, Patient/Family education, Self Care, Joint mobilization, Aquatic Therapy, Dry Needling, Cryotherapy, Moist heat, Taping, Manual therapy, and Re-evaluation   PLAN FOR NEXT  SESSION: review/update HEP PRN. Quad activation BIL, rotational hip stability, gradual progression to more functional strengthening. Pt wants to work towards floor transfers and more stairs  Jannette Spanner, [place] 01/27/23 12:37 PM Phone: [phone] Fax: [phone]      Check all possible CPT codes: 29562 - PT Re-evaluation, 97110- Therapeutic Exercise, [phone]- Neuro Re-education, [phone] - Gait Training, [phone] - Manual Therapy, [phone] - Therapeutic Activities, [phone] - Self Care, and [phone] - Aquatic therapy    Check all  conditions that are expected to impact treatment: {Conditions expected to impact treatment:Morbid obesity, Musculoskeletal disorders, and Social determinants of health   If treatment provided at initial evaluation, no treatment charged due to lack of authorization.

## 2023-01-31 NOTE — Therapy (Signed)
OUTPATIENT PHYSICAL THERAPY TREATMENT NOTE   Patient Name: Christine Oneal MRN: [id number] DOB:04-Mar-1971, 52 y.o., female Today's Date: 02/01/2023  PCP: Myrlene Broker, MD REFERRING PROVIDER: Rodolph Bong, MD   END OF SESSION:   PT End of Session - 02/01/23 1014     Visit Number 13    Number of Visits 22    Date for PT Re-Evaluation 02/18/23    Authorization Type [employer]    Authorization Time Period 9 visits 01/10/23-03/10/23    Authorization - Visit Number 3    Authorization - Number of Visits 9    PT Start Time 1015    PT Stop Time 1040    PT Time Calculation (min) 25 min    Activity Tolerance Patient tolerated treatment well    Behavior During Therapy WFL for tasks assessed/performed                Past Medical History:  Diagnosis Date   Abnormal Pap smear of cervix    Allergy    Anemia    Arthritis    CHF (congestive heart failure) (HCC)    Dysrhythmia    A fib   Family history of adverse reaction to anesthesia    Fibroid    History of atrial fibrillation    History of kidney stones    Hyperparathyroidism (HCC)    Hypertension    Leukocytosis, unspecified 10/18/2013   Obesity    Plantar fasciitis    Pneumonia    Past Surgical History:  Procedure Laterality Date   ATRIAL FIBRILLATION ABLATION N/A 08/03/2022   Procedure: ATRIAL FIBRILLATION ABLATION;  Surgeon: Lanier Prude, MD;  Location: [facility] IN[facility]ASIVE [facility];  Service: Cardiovascular;  Laterality: N/A;   CARDIOVERSION N/A 05/12/2021   Procedure: CARDIOVERSION;  Surgeon: Dolores Patty, MD;  Location: [facility] ENDOSCOPY;  Service: Cardiovascular;  Laterality: N/A;   CARDIOVERSION N/A 05/29/2021   Procedure: CARDIOVERSION;  Surgeon: Dolores Patty, MD;  Location: [facility] ENDOSCOPY;  Service: Cardiovascular;  Laterality: N/A;   COLPOSCOPY     PARATHYROIDECTOMY N/A 01/17/2023   Procedure: NECK EXPLORATION WITH PARATHYROIDECTOMY;  Surgeon: Darnell Level, MD;  Location: [facility] ORS;  Service:  General;  Laterality: N/A;   RIGHT/LEFT HEART CATH AND CORONARY ANGIOGRAPHY N/A 05/11/2021   Procedure: RIGHT/LEFT HEART CATH AND CORONARY ANGIOGRAPHY;  Surgeon: Dolores Patty, MD;  Location: [facility] INVASIVE CV [facility];  Service: Cardiovascular;  Laterality: N/A;   TEE WITHOUT CARDIOVERSION N/A 05/12/2021   Procedure: TRANSESOPHAGEAL ECHOCARDIOGRAM (TEE);  Surgeon: Dolores Patty, MD;  Location: [facility] ENDOSCOPY;  Service: Cardiovascular;  Laterality: N/A;   TUBAL LIGATION  2001   Patient Active Problem List   Diagnosis Date Noted   Right foot pain 12/09/2022   Urinary incontinence 08/23/2022   Chronic pain of both knees 08/23/2022   Gait instability 08/23/2022   Dysmenorrhea 05/14/2022   Left foot pain 05/14/2022   Encounter for general adult medical examination with abnormal findings 04/17/2022   Primary hyperparathyroidism (HCC) 11/25/2021   Chronic systolic CHF (congestive heart failure) (HCC) 05/26/2021   Unspecified atrial fibrillation (HCC) 05/04/2021   Anemia 09/04/2017   Morbid obesity (HCC) 09/03/2017   Reactive airway disease 09/03/2017   Vitamin D deficiency 09/03/2017   Essential hypertension 10/24/2007   Hemorrhoids 10/24/2007    REFERRING DIAG: M25.561,M25.562,G89.29 (ICD-10-CM) - Chronic pain of both knees M25.551 (ICD-10-CM) - Right hip pain (added 12/02/22)  THERAPY DIAG:  Chronic pain of both knees  Muscle weakness (generalized)  Other abnormalities  of gait and mobility  Pain in right hip  Rationale for Evaluation and Treatment Rehabilitation  PERTINENT HISTORY: HTN, CHF, afib (pt states well controlled since ablation in January although does have some exertional SOB with activity)  PRECAUTIONS: fall hx, cardiac hx    SUBJECTIVE:                                                                                                                                                                                     SUBJECTIVE STATEMENT:   Pt states she feels  about the same with her hip, knees continue to feel a bit better and responds to exercise well. States she felt good during last session but felt really fatigued after, requests shortened session today which is respected. Sees surgeon next week    PAIN:  Are you having pain: 0/10 (worst 5-6/10) Location/description: right lateral hip to knee  Aggravating factors: stair navigation, standing/walking >28min, sleeping R side (wakes up 3x/night on average), sitting >14min Easing factors: rest, changing positions, medication, voltaren     OBJECTIVE: (objective measures completed at initial evaluation unless otherwise dated) DIAGNOSTIC FINDINGS:  Lumbar XR 10/22/22 IMPRESSION: 1. 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 2. Right kidney stone.   BIL knee XR February 2024 with degenerative changes, see EPIC for details  Hip/pelvis XR 11/26/22  IMPRESSION: Degenerative changes lower lumbar spine and both hips. No acute bony or joint abnormality.   PATIENT SURVEYS:  LEFS eval - 43/80 12/02/22: 34/80 12/30/22: 43/80  01/07/2023: 30/80  PALPATION: Concordant tenderness B lat quads and TFL, no pain with gentle patellar mobs in all directions, no joint line or tendon tenderness    LOWER EXTREMITY ROM:      Active  Right eval Left eval Right 11/30/22 Left 11/30/22 R/L 12-16-22  Hip flexion         Hip extension         Hip internal rotation         Hip external rotation         Knee extension 0 0     Knee flexion 120 deg s 110 deg painless 120 114 120/120  (Blank rows = not tested) (Key: WFL = within functional limits not formally assessed, * = concordant pain, s = stiffness/stretching sensation, NT = not tested)  Comments:    LOWER EXTREMITY MMT:     MMT Right eval Left eval R/L 12/02/22 Rt / Lt 01/07/2023 Rt/LT 01/27/23  Hip flexion 4 3+ 4/4+ 4 / 4   Hip abduction (modified sitting) 4 4 5/5    Hip internal rotation     3/3+ 4- / 4 4 /  4  Hip external rotation      3+/4 4- / 4 4/ 4  Knee flexion 4+ 4+     Knee extension 4 *  4+  4+ / 5   Ankle dorsiflexion           (Blank rows = not tested) (Key: WFL = within functional limits not formally assessed, * = concordant pain, s = stiffness/stretching sensation, NT = not tested)  Comments:     FUNCTIONAL TESTS:  5xSTS: 14.66 sec w UE support from standard chair no increase in pain 12/02/22 5xSTS: 12.28 sec gentle UE support at thighs, hip pain   GAIT: Distance walked: within clinic Assistive device utilized: None Level of assistance: Complete Independence Comments: reduced gait speed/cadence, widened BOS, reduced trunk rotation/arm swing   HIP EXAM 12/02/22: LLE negative FABER, FADDIR, distraction/compression RLE positive FADDIR for groin pain, FABER and compression irritate SI, distraction unrelieving       TODAY'S TREATMENT: [facility] Adult PT Treatment:                                                DATE: 02/01/23 Therapeutic Exercise: Seated march 2x10 BIL cues for posture and pacing Seated green band hip abduction 3x10  Standing cone taps x10 BIL cues for control and posture Lateral step up 2 inch step emphasis on pacing/control 2x8 BIL  Standing heel raise x10   [facility] Adult PT Treatment:                                                DATE: 01/27/23 Therapeutic Exercise: Nustep L5 x 5 min Standing hip abduction 10 x 2 each  Standing hip flexion alternating 10 x 2 each  Standing heel raise  Tandem and SLS on airex ( 20-30 sec SLS today)  STS , squat taps from mat x 12 Bridge x 12 S/L clam AROM  x15 each SLR x 10  each  Supine march with green band  Supine clam green  Ball squeeze  Bridge with ball squeeze     [facility] Adult PT Treatment:                                                DATE: 01/11/23 Therapeutic Exercise: Nu step L7 UE/LE  STS BW x12, 10# x8, 15# x5, 10# x8, BW x12 SLR attempt w/ and w/o contralat ball, discontinued after a couple reps due to tailbone pain Quad set  with heel prop x12 BIL LE cues for quad contraction Hooklying adductor iso x12 cues for comfortable contraction Hooklying swiss ball press down x12 cues for breath control  6 inch fwd step ups x10 BIL LE    [facility] Adult PT Treatment:                                                DATE: 01/07/2023 Therapeutic Exercise: NuStep L5 x 5 min with UE/LE while taking subjective Sit to stand 2  x 10 SLR x 10 each, greater difficulty on right due to report of left tailbone pain Bridge x 10 Hooklying clamshell with green x 10 Forward 6" step-up x 10 each Manual Therapy: Skilled palpation and monitoring of muscle tension while performing TPDN STM with roller over lateral hip, thigh, quad Trigger Point Dry Needling Treatment: Pre-treatment instruction: Patient instructed on dry needling rationale, procedures, and possible side effects including pain during treatment (achy,cramping feeling), bruising, drop of blood, lightheadedness, nausea, sweating. Patient Consent Given: Yes Education handout provided: Previously provided Muscles treated: right piriformis, glute med, TFL  Needle size and number: .30x58mm x 5 Electrical stimulation performed: No Parameters: N/A Treatment response/outcome: Twitch response elicited and Palpable decrease in muscle tension Post-treatment instructions: Patient instructed to expect possible mild to moderate muscle soreness later today and/or tomorrow. Patient instructed in methods to reduce muscle soreness and to continue prescribed HEP. If patient was dry needled over the lung field, patient was instructed on signs and symptoms of pneumothorax and, however unlikely, to see immediate medical attention should they occur. Patient was also educated on signs and symptoms of infection and to seek medical attention should they occur. Patient verbalized understanding of these instructions and education.   [facility] Adult PT Treatment:                                                DATE:  12/30/22 Therapeutic Exercise: Nustep L5 LE only x 5 minutes 15# STS AMRAP x15 reps 6 inch step up 1 UE x 10 each  Tandem on AIREX 45 sec + x 2 SLS on AIREX 4-8 sec x 2 each  Gastroc slant board  S/L right hip clam x 10, reverse clam x 10 Supine clam green band x10 Banded bridge x 10  Ball squeeze with bridge x 10 Standing quad stretch with UE at counter  LEFS  [facility] Adult PT Treatment:                                                DATE: 12/28/22 Therapeutic Exercise: GTB IR with ball as fulcrum, 2x15 cues for pacing GTB ER with ball as fulcrum 2x15 cues for pacing 10# STS x10, 15# x8, 10# x10 cues for form and pacing Standing hip flexor mini lunge position x10 cues for comfortable ROM  Education on relevant anatomy/physiology as it pertains to exercise in session, HEP, and general exercise outside of sessions Therapeutic Activity: Time spent w/ education/discussion re: functional mobility, floor transfers, stair navigation, and strategies to progress exercises towards these goals as appropriate Fwd step ups 6 inch at counter x5 BIL, 4inch x6 BIL cues for pacing and control (particularly eccentric control on RLE)   PATIENT EDUCATION:  Education details: POC, HEP Person educated: Patient Education method: Explanation, Demonstration, Tactile cues, Verbal cues Education comprehension: verbalized understanding, returned demonstration, verbal cues required, tactile cues required, and needs further education     HOME EXERCISE PROGRAM: Access Code: [email or web address]   ASSESSMENT: CLINICAL IMPRESSION: Pt arrives w/ mild hip pain, no knee pain. She states she felt good during last session but felt pretty significant fatigue afterwards and requests today's session be shortened - this is respected. Continuing to work on seated/standing exercises with focus on  hip strength/endurance within pt tolerance. Denies any significant change in symptoms on departure, notes fatigue feels a bit better today. No  adverse events. Recommend continuing along current POC in order to address relevant deficits and improve functional tolerance. Pt departs today's session in no acute distress, all voiced questions/concerns addressed appropriately from PT perspective.     OBJECTIVE IMPAIRMENTS: decreased activity tolerance, decreased balance, decreased endurance, decreased mobility, difficulty walking, decreased ROM, decreased strength, and pain.    ACTIVITY LIMITATIONS: carrying, bending, sitting, standing, squatting, sleeping, stairs, transfers, and locomotion level   PARTICIPATION LIMITATIONS: meal prep, cleaning, laundry, community activity, and occupation   PERSONAL FACTORS: Time since onset of injury/illness/exacerbation and 3+ comorbidities: afib/CHF, HTN, MSK comorbidities (LBP)  are also affecting patient's functional outcome.      GOALS: Goals reviewed with patient? No given time constraints   SHORT TERM GOALS: Target date: = LTG   Pt will demonstrate appropriate understanding and performance of initially prescribed HEP in order to facilitate improved independence with management of symptoms.  Baseline: HEP provided on eval 11/22/22: requires cues  12/03/22: Independent with initial HEP Goal status: MET   2. Pt will score greater than or equal to 52 on LEFS in order to demonstrate improved perception of function due to symptoms.             Baseline: 43/80  11/22/22: not tested due to absence from PT  12/02/22: 34/80 12/30/22: 43/80 01/07/2023: 30/80             Goal status: ONGOING   LONG TERM GOALS: Target date: 02/18/2023  Pt will score 60 or  greater  on LEFS in order to demonstrate improved perception of function due to symptoms.  Baseline: 43/80 12/02/22: 34/80  12/30/22: 43/80 01/07/2023: 30/80 Goal status:ONGOING   2.  Pt will demonstrate at least 120 degrees of knee flexion ROM bilaterally in order to facilitate improved mechanics with gait/transfers. Baseline: see ROM chart  above 12/02/22: NT given hip re-eval  12-16-22 See chart R 120, L 120 Goal status: MET   3.  Pt will demonstrate appropriate performance of final prescribed HEP in order to facilitate improved self-management of symptoms post-discharge.              Baseline: initial HEP prescribed  12/02/22: good HEP adherence reported  01/07/2023: good HEP adherence reported             Goal status: ONGOING     4.  Pt will be able to perform 5xSTS in less than or equal to 12sec without UE support in order to demonstrate reduced fall risk and improved functional independence (MCID 5xSTS = 2.3 sec). Baseline: 14.66sec w UE support 12/02/22: 12sec w gentle UE support 01/07/2023: 12 seconds without UE support Goal status: MET   5. Pt will report at least 50% decrease in overall pain levels in past week in order to facilitate improved tolerance to basic ADLs/mobility.              Baseline: 2-7/10  12/02/22: 5-6/10 hip at worst, 1-2/10 knees at worst  12-16-22  1/10 today and just a specific pain on lateral thigh  12/30/22: knees and hip can reach 5-6/10  01/07/2023: worst 5-6/10             Goal status: PROGRESSING   6. Pt will report/demonstrate ability to stand/walk for up to 45 min with less than 3 pt increase in resting pain in order to promote improved community navigation and exercise  tolerance for overall health/QOL.              Baseline: pain >31min of standing/walking  12/02/22: pain >14min in hip/groin, knees no longer limiting walking most of the time 01/07/2023: patient reports 15-20 minutes on a good day             Goal status: ONGOING  7. Pt will demo R hip ER/IR MMT of at least 4/5 in order to demonstrate improved functional strength.  Baseline: see MMT chart above  01/07/2023: 4-/5 MMT right hip ER/IR 01/27/23: 4/5  Goal status: MET     PLAN   PT FREQUENCY:  2x/week    PT DURATION: 6 weeks   PLANNED INTERVENTIONS: Therapeutic exercises, Therapeutic activity, Neuromuscular re-education,  Balance training, Gait training, Patient/Family education, Self Care, Joint mobilization, Aquatic Therapy, Dry Needling, Cryotherapy, Moist heat, Taping, Manual therapy, and Re-evaluation   PLAN FOR NEXT SESSION: review/update HEP PRN. Quad activation BIL, rotational hip stability, gradual progression to more functional strengthening. Pt wants to work towards floor transfers and more stairs    Ashley Murrain PT, DPT 02/01/2023 10:44 AM      Check all possible CPT codes: 29528 - PT Re-evaluation, 97110- Therapeutic Exercise, [phone]- Neuro Re-education, [phone] - Gait Training, [phone] - Manual Therapy, [phone] - Therapeutic Activities, [phone] - Self Care, and [phone] - Aquatic therapy    Check all conditions that are expected to impact treatment: {Conditions expected to impact treatment:Morbid obesity, Musculoskeletal disorders, and Social determinants of health   If treatment provided at initial evaluation, no treatment charged due to lack of authorization.

## 2023-02-01 ENCOUNTER — Encounter: Payer: Self-pay | Admitting: Physical Therapy

## 2023-02-01 ENCOUNTER — Ambulatory Visit: Payer: Medicaid Other | Admitting: Physical Therapy

## 2023-02-01 DIAGNOSIS — M25551 Pain in right hip: Secondary | ICD-10-CM

## 2023-02-01 DIAGNOSIS — G8929 Other chronic pain: Secondary | ICD-10-CM | POA: Diagnosis not present

## 2023-02-01 DIAGNOSIS — M6281 Muscle weakness (generalized): Secondary | ICD-10-CM

## 2023-02-01 DIAGNOSIS — R2689 Other abnormalities of gait and mobility: Secondary | ICD-10-CM | POA: Diagnosis not present

## 2023-02-01 DIAGNOSIS — M25561 Pain in right knee: Secondary | ICD-10-CM | POA: Diagnosis not present

## 2023-02-01 DIAGNOSIS — M25562 Pain in left knee: Secondary | ICD-10-CM | POA: Diagnosis not present

## 2023-02-08 DIAGNOSIS — E213 Hyperparathyroidism, unspecified: Secondary | ICD-10-CM | POA: Diagnosis not present

## 2023-02-08 DIAGNOSIS — Z9889 Other specified postprocedural states: Secondary | ICD-10-CM | POA: Diagnosis not present

## 2023-02-08 DIAGNOSIS — Z9089 Acquired absence of other organs: Secondary | ICD-10-CM | POA: Diagnosis not present

## 2023-02-09 ENCOUNTER — Ambulatory Visit: Payer: Medicaid Other | Admitting: Physical Therapy

## 2023-02-15 ENCOUNTER — Encounter: Payer: Self-pay | Admitting: Physical Therapy

## 2023-02-15 ENCOUNTER — Ambulatory Visit: Payer: Medicaid Other | Attending: Family Medicine | Admitting: Physical Therapy

## 2023-02-15 DIAGNOSIS — M25551 Pain in right hip: Secondary | ICD-10-CM | POA: Diagnosis not present

## 2023-02-15 DIAGNOSIS — G8929 Other chronic pain: Secondary | ICD-10-CM | POA: Insufficient documentation

## 2023-02-15 DIAGNOSIS — R2689 Other abnormalities of gait and mobility: Secondary | ICD-10-CM | POA: Diagnosis not present

## 2023-02-15 DIAGNOSIS — M6281 Muscle weakness (generalized): Secondary | ICD-10-CM | POA: Diagnosis not present

## 2023-02-15 DIAGNOSIS — M25561 Pain in right knee: Secondary | ICD-10-CM | POA: Insufficient documentation

## 2023-02-15 DIAGNOSIS — M25562 Pain in left knee: Secondary | ICD-10-CM | POA: Insufficient documentation

## 2023-02-15 NOTE — Therapy (Signed)
OUTPATIENT PHYSICAL THERAPY TREATMENT NOTE   Patient Name: Christine Oneal MRN: [id number] DOB:February 19, 1971, 52 y.o., female Today's Date: 02/15/2023  PCP: Myrlene Broker, MD REFERRING PROVIDER: Rodolph Bong, MD   END OF SESSION:   PT End of Session - 02/15/23 1021     Visit Number 14    Number of Visits 22    Date for PT Re-Evaluation 02/18/23    Authorization Type [employer]    Authorization Time Period 9 visits 01/10/23-03/10/23    Authorization - Visit Number 4    Authorization - Number of Visits 9    PT Start Time 1021    PT Stop Time 1101    PT Time Calculation (min) 40 min    Activity Tolerance Patient tolerated treatment well    Behavior During Therapy WFL for tasks assessed/performed                 Past Medical History:  Diagnosis Date   Abnormal Pap smear of cervix    Allergy    Anemia    Arthritis    CHF (congestive heart failure) (HCC)    Dysrhythmia    A fib   Family history of adverse reaction to anesthesia    Fibroid    History of atrial fibrillation    History of kidney stones    Hyperparathyroidism (HCC)    Hypertension    Leukocytosis, unspecified 10/18/2013   Obesity    Plantar fasciitis    Pneumonia    Past Surgical History:  Procedure Laterality Date   ATRIAL FIBRILLATION ABLATION N/A 08/03/2022   Procedure: ATRIAL FIBRILLATION ABLATION;  Surgeon: Lanier Prude, MD;  Location: [facility]IVE [facility];  Service: Cardiovascular;  Laterality: N/A;   CARDIOVERSION N/A 05/12/2021   Procedure: CARDIOVERSION;  Surgeon: Dolores Patty, MD;  Location: [facility] ENDOSCOPY;  Service: Cardiovascular;  Laterality: N/A;   CARDIOVERSION N/A 05/29/2021   Procedure: CARDIOVERSION;  Surgeon: Dolores Patty, MD;  Location: [facility] ENDOSCOPY;  Service: Cardiovascular;  Laterality: N/A;   COLPOSCOPY     PARATHYROIDECTOMY N/A 01/17/2023   Procedure: NECK EXPLORATION WITH PARATHYROIDECTOMY;  Surgeon: Darnell Level, MD;  Location: [facility] ORS;  Service:  General;  Laterality: N/A;   RIGHT/LEFT HEART CATH AND CORONARY ANGIOGRAPHY N/A 05/11/2021   Procedure: RIGHT/LEFT HEART CATH AND CORONARY ANGIOGRAPHY;  Surgeon: Dolores Patty, MD;  Location: [facility] INV[facility]IVE CV [facility];  Service: Cardiovascular;  Laterality: N/A;   TEE WITHOUT CARDIOVERSION N/A 05/12/2021   Procedure: TRANSESOPHAGEAL ECHOCARDIOGRAM (TEE);  Surgeon: Dolores Patty, MD;  Location: [facility] ENDOSCOPY;  Service: Cardiovascular;  Laterality: N/A;   TUBAL LIGATION  2001   Patient Active Problem List   Diagnosis Date Noted   Right foot pain 12/09/2022   Urinary incontinence 08/23/2022   Chronic pain of both knees 08/23/2022   Gait instability 08/23/2022   Dysmenorrhea 05/14/2022   Left foot pain 05/14/2022   Encounter for general adult medical examination with abnormal findings 04/17/2022   Primary hyperparathyroidism (HCC) 11/25/2021   Chronic systolic CHF (congestive heart failure) (HCC) 05/26/2021   Unspecified atrial fibrillation (HCC) 05/04/2021   Anemia 09/04/2017   Morbid obesity (HCC) 09/03/2017   Reactive airway disease 09/03/2017   Vitamin D deficiency 09/03/2017   Essential hypertension 10/24/2007   Hemorrhoids 10/24/2007    REFERRING DIAG: M25.561,M25.562,G89.29 (ICD-10-CM) - Chronic pain of both knees M25.551 (ICD-10-CM) - Right hip pain (added 12/02/22)  THERAPY DIAG:  No diagnosis found.  Rationale for Evaluation and Treatment Rehabilitation  PERTINENT HISTORY: HTN, CHF, afib (pt states well controlled since ablation in January although does have some exertional SOB with activity)  PRECAUTIONS: fall hx, cardiac hx    SUBJECTIVE:                                                                                                                                                                                     SUBJECTIVE STATEMENT:    "Everything is going alright. I still have some things that I would like to work on with strengthening."   PAIN:  Are you  having pain: 1/10 (worst 5-6/10) Location/description: right lateral hip to knee  Aggravating factors: stair navigation, standing/walking >57min, sleeping R side (wakes up 3x/night on average), sitting >65min Easing factors: rest, changing positions, medication, voltaren     OBJECTIVE: (objective measures completed at initial evaluation unless otherwise dated) DIAGNOSTIC FINDINGS:  Lumbar XR 10/22/22 IMPRESSION: 1. 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 2. Right kidney stone.   BIL knee XR February 2024 with degenerative changes, see EPIC for details  Hip/pelvis XR 11/26/22  IMPRESSION: Degenerative changes lower lumbar spine and both hips. No acute bony or joint abnormality.   PATIENT SURVEYS:  LEFS eval - 43/80 12/02/22: 34/80 12/30/22: 43/80  01/07/2023: 30/80  PALPATION: Concordant tenderness B lat quads and TFL, no pain with gentle patellar mobs in all directions, no joint line or tendon tenderness    LOWER EXTREMITY ROM:      Active  Right eval Left eval Right 11/30/22 Left 11/30/22 R/L 12-16-22  Hip flexion         Hip extension         Hip internal rotation         Hip external rotation         Knee extension 0 0     Knee flexion 120 deg s 110 deg painless 120 114 120/120  (Blank rows = not tested) (Key: WFL = within functional limits not formally assessed, * = concordant pain, s = stiffness/stretching sensation, NT = not tested)  Comments:    LOWER EXTREMITY MMT:     MMT Right eval Left eval R/L 12/02/22 Rt / Lt 01/07/2023 Rt/LT 01/27/23  Hip flexion 4 3+ 4/4+ 4 / 4   Hip abduction (modified sitting) 4 4 5/5    Hip internal rotation     3/3+ 4- / 4 4 / 4  Hip external rotation     3+/4 4- / 4 4/ 4  Knee flexion 4+ 4+     Knee extension 4 *  4+  4+ / 5   Ankle dorsiflexion           (  Blank rows = not tested) (Key: WFL = within functional limits not formally assessed, * = concordant pain, s = stiffness/stretching sensation, NT = not  tested)  Comments:     FUNCTIONAL TESTS:  5xSTS: 14.66 sec w UE support from standard chair no increase in pain 12/02/22 5xSTS: 12.28 sec gentle UE support at thighs, hip pain   GAIT: Distance walked: within clinic Assistive device utilized: None Level of assistance: Complete Independence Comments: reduced gait speed/cadence, widened BOS, reduced trunk rotation/arm swing   HIP EXAM 12/02/22: LLE negative FABER, FADDIR, distraction/compression RLE positive FADDIR for groin pain, FABER and compression irritate SI, distraction unrelieving       TODAY'S TREATMENT: [facility] Adult PT Treatment:                                                DATE: 02/15/2023 Therapeutic Exercise: Stair training using 6 inch steps x 6 with proper  Modified lunge 1 x 5 bil with bosu one hand on table on hand on knee. Then repeat and both hands on knee lead knee 1 x 5 ea.  Dead lift from 8 inch step 2 x 10 15#  Updated HEP for mini lunge Manual Therapy: MTPR RLE x 2 with graduating pressure - Taught pt how to perform at home  Therapeutic Activity: Reviewed how to navigate up/ down steps reciprocally to maximize efficiencey and reduce issues   [facility] Adult PT Treatment:                                                DATE: 02/01/23 Therapeutic Exercise: Seated march 2x10 BIL cues for posture and pacing Seated green band hip abduction 3x10  Standing cone taps x10 BIL cues for control and posture Lateral step up 2 inch step emphasis on pacing/control 2x8 BIL  Standing heel raise x10   [facility] Adult PT Treatment:                                                DATE: 01/27/23 Therapeutic Exercise: Nustep L5 x 5 min Standing hip abduction 10 x 2 each  Standing hip flexion alternating 10 x 2 each  Standing heel raise  Tandem and SLS on airex ( 20-30 sec SLS today)  STS , squat taps from mat x 12 Bridge x 12 S/L clam AROM  x15 each SLR x 10  each  Supine march with green band  Supine clam green  Ball squeeze   Bridge with ball squeeze    HOME EXERCISE PROGRAM: Access Code: [email or web address] URL: [email or web address] Date: 02/15/2023 Prepared by: Lulu Riding  Exercises - Seated Long Arc Quad  - 1 x daily - 7 x weekly - 2 sets - 10 reps - Seated Heel Toe Raises  - 1 x daily - 7 x weekly - 2 sets - 10 reps - Seated March  - 1 x daily - 7 x weekly - 1 sets - 10 reps - 3 hold - Sit to stand with control  - 1 x daily - 7 x weekly - 1 sets - 10 reps - Modified  Thomas Stretch  - 1 x daily - 7 x weekly - 1 sets - 3 reps - 20-30 hold - Standing Quad Stretch with Towel and Arm Support  - 1 x daily - 7 x weekly - 1 sets - 2-3 reps - 30 sec hold - Forward Step Up  - 1 x daily - 7 x weekly - 3 sets - 10 reps - Standing Alternating Partial Lunge  - 1 x daily - 7 x weekly - 2 sets - 10 reps   ASSESSMENT: CLINICAL IMPRESSION: Pt arrives to session reporting minimal soreness rated at a 1/10 located along bil vastus lateralis. Reviewed techniques on how to perform manual techniques to relieve muscle tension. Continued working on stair training which she did quite well with requiring min verbal cues/ demonstration for proper form. Continued working strengthening with focus on quad/ glute strengthening to assist with getting up/ down from the floor which she did well with.      OBJECTIVE IMPAIRMENTS: decreased activity tolerance, decreased balance, decreased endurance, decreased mobility, difficulty walking, decreased ROM, decreased strength, and pain.    ACTIVITY LIMITATIONS: carrying, bending, sitting, standing, squatting, sleeping, stairs, transfers, and locomotion level   PARTICIPATION LIMITATIONS: meal prep, cleaning, laundry, community activity, and occupation   PERSONAL FACTORS: Time since onset of injury/illness/exacerbation and 3+ comorbidities: afib/CHF, HTN, MSK comorbidities (LBP)  are also affecting patient's functional outcome.      GOALS: Goals reviewed with patient? No given time  constraints   SHORT TERM GOALS: Target date: = LTG   Pt will demonstrate appropriate understanding and performance of initially prescribed HEP in order to facilitate improved independence with management of symptoms.  Baseline: HEP provided on eval 11/22/22: requires cues  12/03/22: Independent with initial HEP Goal status: MET   2. Pt will score greater than or equal to 52 on LEFS in order to demonstrate improved perception of function due to symptoms.             Baseline: 43/80  11/22/22: not tested due to absence from PT  12/02/22: 34/80 12/30/22: 43/80 01/07/2023: 30/80             Goal status: ONGOING   LONG TERM GOALS: Target date: 02/18/2023  Pt will score 60 or  greater  on LEFS in order to demonstrate improved perception of function due to symptoms.  Baseline: 43/80 12/02/22: 34/80  12/30/22: 43/80 01/07/2023: 30/80 Goal status:ONGOING   2.  Pt will demonstrate at least 120 degrees of knee flexion ROM bilaterally in order to facilitate improved mechanics with gait/transfers. Baseline: see ROM chart above 12/02/22: NT given hip re-eval  12-16-22 See chart R 120, L 120 Goal status: MET   3.  Pt will demonstrate appropriate performance of final prescribed HEP in order to facilitate improved self-management of symptoms post-discharge.              Baseline: initial HEP prescribed  12/02/22: good HEP adherence reported  01/07/2023: good HEP adherence reported             Goal status: ONGOING     4.  Pt will be able to perform 5xSTS in less than or equal to 12sec without UE support in order to demonstrate reduced fall risk and improved functional independence (MCID 5xSTS = 2.3 sec). Baseline: 14.66sec w UE support 12/02/22: 12sec w gentle UE support 01/07/2023: 12 seconds without UE support Goal status: MET   5. Pt will report at least 50% decrease in overall pain levels in  past week in order to facilitate improved tolerance to basic ADLs/mobility.              Baseline:  2-7/10  12/02/22: 5-6/10 hip at worst, 1-2/10 knees at worst  12-16-22  1/10 today and just a specific pain on lateral thigh  12/30/22: knees and hip can reach 5-6/10  01/07/2023: worst 5-6/10             Goal status: PROGRESSING   6. Pt will report/demonstrate ability to stand/walk for up to 45 min with less than 3 pt increase in resting pain in order to promote improved community navigation and exercise tolerance for overall health/QOL.              Baseline: pain >48min of standing/walking  12/02/22: pain >40min in hip/groin, knees no longer limiting walking most of the time 01/07/2023: patient reports 15-20 minutes on a good day             Goal status: ONGOING  7. Pt will demo R hip ER/IR MMT of at least 4/5 in order to demonstrate improved functional strength.  Baseline: see MMT chart above  01/07/2023: 4-/5 MMT right hip ER/IR 01/27/23: 4/5  Goal status: MET     PLAN   PT FREQUENCY:  2x/week    PT DURATION: 6 weeks   PLANNED INTERVENTIONS: Therapeutic exercises, Therapeutic activity, Neuromuscular re-education, Balance training, Gait training, Patient/Family education, Self Care, Joint mobilization, Aquatic Therapy, Dry Needling, Cryotherapy, Moist heat, Taping, Manual therapy, and Re-evaluation   PLAN FOR NEXT SESSION: review/update HEP PRN. Quad activation BIL, rotational hip stability, gradual progression to more functional strengthening. Pt wants to work towards floor transfers and more stairs    Ashley Murrain PT, DPT 02/15/2023 11:05 AM

## 2023-02-16 ENCOUNTER — Ambulatory Visit: Payer: Medicaid Other | Admitting: Physical Therapy

## 2023-02-16 ENCOUNTER — Encounter: Payer: Self-pay | Admitting: Physical Therapy

## 2023-02-16 DIAGNOSIS — R32 Unspecified urinary incontinence: Secondary | ICD-10-CM | POA: Diagnosis not present

## 2023-02-16 DIAGNOSIS — M6281 Muscle weakness (generalized): Secondary | ICD-10-CM | POA: Diagnosis not present

## 2023-02-16 DIAGNOSIS — M25551 Pain in right hip: Secondary | ICD-10-CM | POA: Diagnosis not present

## 2023-02-16 DIAGNOSIS — R2689 Other abnormalities of gait and mobility: Secondary | ICD-10-CM | POA: Diagnosis not present

## 2023-02-16 DIAGNOSIS — M25561 Pain in right knee: Secondary | ICD-10-CM | POA: Diagnosis not present

## 2023-02-16 DIAGNOSIS — M25562 Pain in left knee: Secondary | ICD-10-CM | POA: Diagnosis not present

## 2023-02-16 DIAGNOSIS — G8929 Other chronic pain: Secondary | ICD-10-CM

## 2023-02-16 DIAGNOSIS — I10 Essential (primary) hypertension: Secondary | ICD-10-CM | POA: Diagnosis not present

## 2023-02-16 DIAGNOSIS — I5022 Chronic systolic (congestive) heart failure: Secondary | ICD-10-CM | POA: Diagnosis not present

## 2023-02-16 NOTE — Therapy (Signed)
OUTPATIENT PHYSICAL THERAPY TREATMENT NOTE   Patient Name: Christine Oneal MRN: [id number] DOB:1970-10-29, 52 y.o., female Today's Date: 02/16/2023  PCP: Myrlene Broker, MD REFERRING PROVIDER: Rodolph Bong, MD   END OF SESSION:   PT End of Session - 02/16/23 0939     Visit Number 15    Number of Visits 22    Date for PT Re-Evaluation 02/18/23    Authorization Type [employer]    Authorization Time Period 9 visits 01/10/23-03/10/23    Authorization - Visit Number 5    Authorization - Number of Visits 9    PT Start Time 0935    PT Stop Time 1015    PT Time Calculation (min) 40 min                 Past Medical History:  Diagnosis Date   Abnormal Pap smear of cervix    Allergy    Anemia    Arthritis    CHF (congestive heart failure) (HCC)    Dysrhythmia    A fib   Family history of adverse reaction to anesthesia    Fibroid    History of atrial fibrillation    History of kidney stones    Hyperparathyroidism (HCC)    Hypertension    Leukocytosis, unspecified 10/18/2013   Obesity    Plantar fasciitis    Pneumonia    Past Surgical History:  Procedure Laterality Date   ATRIAL FIBRILLATION ABLATION N/A 08/03/2022   Procedure: ATRIAL FIBRILLATION ABLATION;  Surgeon: Lanier Prude, MD;  Location: [facility];  Service: Cardiovascular;  Laterality: N/A;   CARDIOVERSION N/A 05/12/2021   Procedure: CARDIOVERSION;  Surgeon: Dolores Patty, MD;  Location: [facility] ENDOSCOPY;  Service: Cardiovascular;  Laterality: N/A;   CARDIOVERSION N/A 05/29/2021   Procedure: CARDIOVERSION;  Surgeon: Dolores Patty, MD;  Location: [facility] ENDOSCOPY;  Service: Cardiovascular;  Laterality: N/A;   COLPOSCOPY     PARATHYROIDECTOMY N/A 01/17/2023   Procedure: NECK EXPLORATION WITH PARATHYROIDECTOMY;  Surgeon: Darnell Level, MD;  Location: [facility] ORS;  Service: General;  Laterality: N/A;   RIGHT/LEFT HEART CATH AND CORONARY ANGIOGRAPHY N/A 05/11/2021   Procedure: RIGHT/LEFT  HEART CATH AND CORONARY ANGIOGRAPHY;  Surgeon: Dolores Patty, MD;  Location: [facility] CV [facility];  Service: Cardiovascular;  Laterality: N/A;   TEE WITHOUT CARDIOVERSION N/A 05/12/2021   Procedure: TRANSESOPHAGEAL ECHOCARDIOGRAM (TEE);  Surgeon: Dolores Patty, MD;  Location: [facility] ENDOSCOPY;  Service: Cardiovascular;  Laterality: N/A;   TUBAL LIGATION  2001   Patient Active Problem List   Diagnosis Date Noted   Right foot pain 12/09/2022   Urinary incontinence 08/23/2022   Chronic pain of both knees 08/23/2022   Gait instability 08/23/2022   Dysmenorrhea 05/14/2022   Left foot pain 05/14/2022   Encounter for general adult medical examination with abnormal findings 04/17/2022   Primary hyperparathyroidism (HCC) 11/25/2021   Chronic systolic CHF (congestive heart failure) (HCC) 05/26/2021   Unspecified atrial fibrillation (HCC) 05/04/2021   Anemia 09/04/2017   Morbid obesity (HCC) 09/03/2017   Reactive airway disease 09/03/2017   Vitamin D deficiency 09/03/2017   Essential hypertension 10/24/2007   Hemorrhoids 10/24/2007    REFERRING DIAG: M25.561,M25.562,G89.29 (ICD-10-CM) - Chronic pain of both knees M25.551 (ICD-10-CM) - Right hip pain (added 12/02/22)  THERAPY DIAG:  Chronic pain of both knees  Muscle weakness (generalized)  Other abnormalities of gait and mobility  Rationale for Evaluation and Treatment Rehabilitation  PERTINENT HISTORY: HTN, CHF, afib (pt  states well controlled since ablation in January although does have some exertional SOB with activity)  PRECAUTIONS: fall hx, cardiac hx    SUBJECTIVE:                                                                                                                                                                                     SUBJECTIVE STATEMENT:    Pt reports no pain in knees or hips today, only soreness in thighs from yesterdays' treatment.   PAIN:  Are you having pain: 0/10 (worst  5-6/10) Location/description: right lateral hip to knee  Aggravating factors: stair navigation, standing/walking >69min, sleeping R side (wakes up 3x/night on average), sitting >97min Easing factors: rest, changing positions, medication, voltaren     OBJECTIVE: (objective measures completed at initial evaluation unless otherwise dated) DIAGNOSTIC FINDINGS:  Lumbar XR 10/22/22 IMPRESSION: 1. 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 2. Right kidney stone.   BIL knee XR February 2024 with degenerative changes, see EPIC for details  Hip/pelvis XR 11/26/22  IMPRESSION: Degenerative changes lower lumbar spine and both hips. No acute bony or joint abnormality.   PATIENT SURVEYS:  LEFS eval - 43/80 12/02/22: 34/80 12/30/22: 43/80  01/07/2023: 30/80  PALPATION: Concordant tenderness B lat quads and TFL, no pain with gentle patellar mobs in all directions, no joint line or tendon tenderness    LOWER EXTREMITY ROM:      Active  Right eval Left eval Right 11/30/22 Left 11/30/22 R/L 12-16-22  Hip flexion         Hip extension         Hip internal rotation         Hip external rotation         Knee extension 0 0     Knee flexion 120 deg s 110 deg painless 120 114 120/120  (Blank rows = not tested) (Key: WFL = within functional limits not formally assessed, * = concordant pain, s = stiffness/stretching sensation, NT = not tested)  Comments:    LOWER EXTREMITY MMT:     MMT Right eval Left eval R/L 12/02/22 Rt / Lt 01/07/2023 Rt/LT 01/27/23  Hip flexion 4 3+ 4/4+ 4 / 4   Hip abduction (modified sitting) 4 4 5/5    Hip internal rotation     3/3+ 4- / 4 4 / 4  Hip external rotation     3+/4 4- / 4 4/ 4  Knee flexion 4+ 4+     Knee extension 4 *  4+  4+ / 5   Ankle dorsiflexion           (Blank rows =  not tested) (Key: WFL = within functional limits not formally assessed, * = concordant pain, s = stiffness/stretching sensation, NT = not tested)  Comments:      FUNCTIONAL TESTS:  5xSTS: 14.66 sec w UE support from standard chair no increase in pain 12/02/22 5xSTS: 12.28 sec gentle UE support at thighs, hip pain   GAIT: Distance walked: within clinic Assistive device utilized: None Level of assistance: Complete Independence Comments: reduced gait speed/cadence, widened BOS, reduced trunk rotation/arm swing   HIP EXAM 12/02/22: LLE negative FABER, FADDIR, distraction/compression RLE positive FADDIR for groin pain, FABER and compression irritate SI, distraction unrelieving       TODAY'S TREATMENT: [facility] Adult PT Treatment:                                                DATE: 02/16/23 Therapeutic Exercise: Nustep L5 LE only x 5 minutes  6 inch step up with opp knee drive x 10 each , 1 UE 6 inch lateral step down x 10 each , 1 UE Dead lift from 6 inch step 2 x 10 15# Front squat tap 15# KB 10 x 2  1/2 lunge using 1 UE on pole x 10 each  Prone quad stretch with strap x 2 each    [facility] Adult PT Treatment:                                                DATE: 02/15/2023 Therapeutic Exercise: Stair training using 6 inch steps x 6 with proper  Modified lunge 1 x 5 bil with bosu one hand on table on hand on knee. Then repeat and both hands on knee lead knee 1 x 5 ea.  Dead lift from 8 inch step 2 x 10 15#  Updated HEP for mini lunge Manual Therapy: MTPR RLE x 2 with graduating pressure - Taught pt how to perform at home  Therapeutic Activity: Reviewed how to navigate up/ down steps reciprocally to maximize efficiencey and reduce issues   [facility] Adult PT Treatment:                                                DATE: 02/01/23 Therapeutic Exercise: Seated march 2x10 BIL cues for posture and pacing Seated green band hip abduction 3x10  Standing cone taps x10 BIL cues for control and posture Lateral step up 2 inch step emphasis on pacing/control 2x8 BIL  Standing heel raise x10   [facility] Adult PT Treatment:                                                 DATE: 01/27/23 Therapeutic Exercise: Nustep L5 x 5 min Standing hip abduction 10 x 2 each  Standing hip flexion alternating 10 x 2 each  Standing heel raise  Tandem and SLS on airex ( 20-30 sec SLS today)  STS , squat taps from mat x 12 Bridge x 12 S/L clam AROM  x15 each  SLR x 10  each  Supine march with green band  Supine clam green  Ball squeeze  Bridge with ball squeeze    HOME EXERCISE PROGRAM: Access Code: [email or web address] URL: [email or web address] Date: 02/15/2023 Prepared by: Lulu Riding  Exercises - Seated Long Arc Quad  - 1 x daily - 7 x weekly - 2 sets - 10 reps - Seated Heel Toe Raises  - 1 x daily - 7 x weekly - 2 sets - 10 reps - Seated March  - 1 x daily - 7 x weekly - 1 sets - 10 reps - 3 hold - Sit to stand with control  - 1 x daily - 7 x weekly - 1 sets - 10 reps - Modified Thomas Stretch  - 1 x daily - 7 x weekly - 1 sets - 3 reps - 20-30 hold - Standing Quad Stretch with Towel and Arm Support  - 1 x daily - 7 x weekly - 1 sets - 2-3 reps - 30 sec hold - Forward Step Up  - 1 x daily - 7 x weekly - 3 sets - 10 reps - Standing Alternating Partial Lunge  - 1 x daily - 7 x weekly - 2 sets - 10 reps   ASSESSMENT: CLINICAL IMPRESSION: Pt arrives to session reporting soreness in thighs from yesterday's session, no pain in hips or knees. Continued with stair training and quad/hip strength to prep for floor transfers. Able to complete 1/2 lunge with 1 UE support.     OBJECTIVE IMPAIRMENTS: decreased activity tolerance, decreased balance, decreased endurance, decreased mobility, difficulty walking, decreased ROM, decreased strength, and pain.    ACTIVITY LIMITATIONS: carrying, bending, sitting, standing, squatting, sleeping, stairs, transfers, and locomotion level   PARTICIPATION LIMITATIONS: meal prep, cleaning, laundry, community activity, and occupation   PERSONAL FACTORS: Time since onset of injury/illness/exacerbation and 3+ comorbidities:  afib/CHF, HTN, MSK comorbidities (LBP)  are also affecting patient's functional outcome.      GOALS: Goals reviewed with patient? No given time constraints   SHORT TERM GOALS: Target date: = LTG   Pt will demonstrate appropriate understanding and performance of initially prescribed HEP in order to facilitate improved independence with management of symptoms.  Baseline: HEP provided on eval 11/22/22: requires cues  12/03/22: Independent with initial HEP Goal status: MET   2. Pt will score greater than or equal to 52 on LEFS in order to demonstrate improved perception of function due to symptoms.             Baseline: 43/80  11/22/22: not tested due to absence from PT  12/02/22: 34/80 12/30/22: 43/80 01/07/2023: 30/80             Goal status: ONGOING   LONG TERM GOALS: Target date: 02/18/2023  Pt will score 60 or  greater  on LEFS in order to demonstrate improved perception of function due to symptoms.  Baseline: 43/80 12/02/22: 34/80  12/30/22: 43/80 01/07/2023: 30/80 Goal status:ONGOING   2.  Pt will demonstrate at least 120 degrees of knee flexion ROM bilaterally in order to facilitate improved mechanics with gait/transfers. Baseline: see ROM chart above 12/02/22: NT given hip re-eval  12-16-22 See chart R 120, L 120 Goal status: MET   3.  Pt will demonstrate appropriate performance of final prescribed HEP in order to facilitate improved self-management of symptoms post-discharge.              Baseline: initial HEP prescribed  12/02/22: good HEP adherence reported  01/07/2023:  good HEP adherence reported             Goal status: ONGOING     4.  Pt will be able to perform 5xSTS in less than or equal to 12sec without UE support in order to demonstrate reduced fall risk and improved functional independence (MCID 5xSTS = 2.3 sec). Baseline: 14.66sec w UE support 12/02/22: 12sec w gentle UE support 01/07/2023: 12 seconds without UE support Goal status: MET   5. Pt will report at least 50%  decrease in overall pain levels in past week in order to facilitate improved tolerance to basic ADLs/mobility.              Baseline: 2-7/10  12/02/22: 5-6/10 hip at worst, 1-2/10 knees at worst  12-16-22  1/10 today and just a specific pain on lateral thigh  12/30/22: knees and hip can reach 5-6/10  01/07/2023: worst 5-6/10  02/16/23: this week pain reports only quad soreness, no pain             Goal status: PROGRESSING   6. Pt will report/demonstrate ability to stand/walk for up to 45 min with less than 3 pt increase in resting pain in order to promote improved community navigation and exercise tolerance for overall health/QOL.              Baseline: pain >44min of standing/walking  12/02/22: pain >37min in hip/groin, knees no longer limiting walking most of the time 01/07/2023: patient reports 15-20 minutes on a good day 02/16/23: 15-20 min             Goal status: ONGOING  7. Pt will demo R hip ER/IR MMT of at least 4/5 in order to demonstrate improved functional strength.  Baseline: see MMT chart above  01/07/2023: 4-/5 MMT right hip ER/IR 01/27/23: 4/5  Goal status: MET     PLAN   PT FREQUENCY:  2x/week    PT DURATION: 6 weeks   PLANNED INTERVENTIONS: Therapeutic exercises, Therapeutic activity, Neuromuscular re-education, Balance training, Gait training, Patient/Family education, Self Care, Joint mobilization, Aquatic Therapy, Dry Needling, Cryotherapy, Moist heat, Taping, Manual therapy, and Re-evaluation   PLAN FOR NEXT SESSION: review/update HEP PRN. Quad activation BIL, rotational hip stability, gradual progression to more functional strengthening. Pt wants to work towards floor transfers and more stairs    Jannette Spanner, [place] 02/16/23 1:02 PM Phone: [phone] Fax: [phone]

## 2023-02-18 ENCOUNTER — Other Ambulatory Visit (INDEPENDENT_AMBULATORY_CARE_PROVIDER_SITE_OTHER): Payer: Medicaid Other

## 2023-02-18 ENCOUNTER — Ambulatory Visit: Payer: Medicaid Other | Admitting: Nurse Practitioner

## 2023-02-18 ENCOUNTER — Ambulatory Visit: Payer: Medicaid Other | Admitting: Physical Therapy

## 2023-02-18 ENCOUNTER — Encounter: Payer: Self-pay | Admitting: Nurse Practitioner

## 2023-02-18 VITALS — BP 110/60 | HR 77 | Ht 63.0 in | Wt 228.0 lb

## 2023-02-18 DIAGNOSIS — K602 Anal fissure, unspecified: Secondary | ICD-10-CM

## 2023-02-18 DIAGNOSIS — K625 Hemorrhage of anus and rectum: Secondary | ICD-10-CM | POA: Diagnosis not present

## 2023-02-18 DIAGNOSIS — N289 Disorder of kidney and ureter, unspecified: Secondary | ICD-10-CM | POA: Diagnosis not present

## 2023-02-18 DIAGNOSIS — D649 Anemia, unspecified: Secondary | ICD-10-CM

## 2023-02-18 DIAGNOSIS — D539 Nutritional anemia, unspecified: Secondary | ICD-10-CM | POA: Diagnosis not present

## 2023-02-18 LAB — COMPREHENSIVE METABOLIC PANEL
ALT: 16 U/L (ref 0–35)
AST: 16 U/L (ref 0–37)
Albumin: 3.9 g/dL (ref 3.5–5.2)
Alkaline Phosphatase: 92 U/L (ref 39–117)
BUN: 31 mg/dL — ABNORMAL HIGH (ref 6–23)
CO2: 26 mEq/L (ref 19–32)
Calcium: 9.4 mg/dL (ref 8.4–10.5)
Chloride: 101 mEq/L (ref 96–112)
Creatinine, Ser: 1.39 mg/dL — ABNORMAL HIGH (ref 0.40–1.20)
GFR: 43.3 mL/min — ABNORMAL LOW (ref >60.00)
Glucose, Bld: 84 mg/dL (ref 70–99)
Potassium: 4 mEq/L (ref 3.5–5.1)
Sodium: 133 mEq/L — ABNORMAL LOW (ref 135–145)
Total Bilirubin: 0.8 mg/dL (ref 0.2–1.2)
Total Protein: 7.3 g/dL (ref 6.0–8.3)

## 2023-02-18 LAB — IBC + FERRITIN
Ferritin: 35.6 ng/mL (ref 10.0–291.0)
Iron: 53 ug/dL (ref 42–145)
Saturation Ratios: 16.4 % — ABNORMAL LOW (ref 20.0–50.0)
TIBC: 323.4 ug/dL (ref 250.0–450.0)
Transferrin: 231 mg/dL (ref 212.0–360.0)

## 2023-02-18 LAB — CBC
HCT: 34.5 % — ABNORMAL LOW (ref 36.0–46.0)
Hemoglobin: 11.4 g/dL — ABNORMAL LOW (ref 12.0–15.0)
MCHC: 32.9 g/dL (ref 30.0–36.0)
MCV: 102.4 fl — ABNORMAL HIGH (ref 78.0–100.0)
Platelets: 365 10*3/uL (ref 150.0–400.0)
RBC: 3.37 Mil/uL — ABNORMAL LOW (ref 3.87–5.11)
RDW: 12.9 % (ref 11.5–15.5)
WBC: 6.9 10*3/uL (ref 4.0–10.5)

## 2023-02-18 LAB — VITAMIN B12: Vitamin B-12: 196 pg/mL — ABNORMAL LOW (ref 211–911)

## 2023-02-18 MED ORDER — AMBULATORY NON FORMULARY MEDICATION: 1 refills | Status: DC

## 2023-02-18 NOTE — Patient Instructions (Signed)
Your provider has requested that you go to the basement level for lab work before leaving today. Press "B" on the elevator. The lab is located at the first door on the left as you exit the elevator.  Miralax- every night as needed   Benefiber- 1 tablespoon daily   Contact our office if your symptoms worsens.  Your provider has prescribed Diltiazem gel for you. Please follow the directions written on your prescription bottle or given to you specifically by your provider. Since this is a specialty medication and is not readily available at most local pharmacies, we have sent your prescription to:  [facility] information is below: Address: [address] [place], [place] [address]  [phone]  *Please DO NOT go directly from our office to pick up this medication! Give the pharmacy 1 day to process the prescription as this is compounded and takes time to make.  Due to recent changes in healthcare laws, you may see the results of your imaging and laboratory studies on MyChart before your provider has had a chance to review them.  We understand that in some cases there may be results that are confusing or concerning to you. Not all laboratory results come back in the same time frame and the provider may be waiting for multiple results in order to interpret others.  Please give [place] 48 hours in order for your provider to thoroughly review all the results before contacting the office for clarification of your results.   Thank you for trusting me with your gastrointestinal care!   Alcide Evener, CRNP

## 2023-02-18 NOTE — Progress Notes (Addendum)
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 She presents to our office today as referred by Dr. Hillard Danker for further evaluation regarding hemorrhoids. She endorses having intermittent anorectal pain and bleeding for the past few months. She describes seeing bright red blood on the stool and on the toilet tissue with associated anorectal pain. She sometimes strains to pass a BM. She is taking Miralax as needed. She was prescribed Anusol cream as needed for hemorrhoidal irritation without improvement. She believes she underwent a colonoscopy by a GI in [place] more than 5 years ago and she believes one or two polyps were possibly removed. She underwent a colonoscopy by Dr. Leone Payor in 2008 due to having rectal bleeding which showed external hemorrhoids, no polyps.  She has chronic anemia, unclear etiology.  Laboratory studies since 07/2022 indicate renal insufficiency.  She denies any knowledge of having any kidney disease but knowledges having kidney stones in the past.  No known family history of colorectal cancer.  She is on Eliquis due to a history of atrial fibrillation status post past cardioversions and ablation.     Latest Ref Rng & Units 01/12/2023   10:33 AM 09/06/2022   11:29 AM 07/23/2022    1:28 PM  CBC  WBC 4.0 - 10.5 K/uL 5.4  7.5  6.5   Hemoglobin 12.0 - 15.0 g/dL 42.5  95.6  38.7   Hematocrit 36.0 - 46.0 % 34.5  34.1  33.1   Platelets 150 - 400 K/uL 298  346.0  337        Latest Ref Rng & Units 01/12/2023   10:33 AM 11/23/2022   10:44 AM 09/06/2022   11:29 AM  CMP  Glucose 70 - 99  mg/dL 82  80  88   BUN 6 - 20 mg/dL 29  23  25    Creatinine 0.44 - 1.00 mg/dL 5.64  3.32  9.51   Sodium 135 - 145 mmol/L 138  138  138   Potassium 3.5 - 5.1 mmol/L 4.5  4.1  3.9   Chloride 98 - 111 mmol/L 107  106  105   CO2 22 - 32 mmol/L 26  25  26    Calcium 8.9 - 10.3 mg/dL 88.4  16.6  06.3   Total Protein 6.0 - 8.3 g/dL   7.2   Total Bilirubin 0.2 - 1.2 mg/dL   0.9   Alkaline Phos 39 - 117 U/L   61   AST 0 - 37 U/L   18   ALT 0 - 35 U/L   31     ECHO 07/2022: IMPRESSIONS Left ventricular ejection fraction, by estimation, is 55 to 60%. The left ventricle has normal function. The left ventricle has no regional wall motion abnormalities. There is mild left ventricular hypertrophy. Left ventricular diastolic parameters are indeterminate. 1. Right ventricular systolic function is normal. The right ventricular size is normal. Tricuspid regurgitation signal is inadequate for assessing PA pressure. 2. 3. No evidence of mitral valve regurgitation. 4. The aortic valve is grossly normal. Aortic valve regurgitation is not visualized. The inferior vena cava is normal in size with greater than 50% respiratory variability, suggesting right atrial pressure  of 3 mmHg.   PAST GI PROCEDURES:  Colonoscopy 03/01/2007 by Dr. Leone Payor due to rectal bleeding: External hemorrhoids No polyps Screening colonoscopy recommended at the age of 89  Past Medical History:  Diagnosis Date   Abnormal Pap smear of cervix    Allergy    Anemia    Arthritis    CHF (congestive heart failure) (HCC)    Dysrhythmia    A fib   Family history of adverse reaction to anesthesia    Fibroid    History of atrial fibrillation    History of kidney stones    Hyperparathyroidism (HCC)    Hypertension    Leukocytosis, unspecified 10/18/2013   Obesity    Plantar fasciitis    Pneumonia    Past Surgical History:  Procedure Laterality Date   ATRIAL FIBRILLATION ABLATION N/A 08/03/2022   Procedure: ATRIAL FIBRILLATION ABLATION;   Surgeon: Lanier Prude, MD;  Location: [facility] INVASIVE CV LAB;  Service: Cardiovascular;  Laterality: N/A;   CARDIOVERSION N/A 05/12/2021   Procedure: CARDIOVERSION;  Surgeon: Dolores Patty, MD;  Location: [facility] ENDOSCOPY;  Service: Cardiovascular;  Laterality: N/A;   CARDIOVERSION N/A 05/29/2021   Procedure: CARDIOVERSION;  Surgeon: Dolores Patty, MD;  Location: [facility] ENDOSCOPY;  Service: Cardiovascular;  Laterality: N/A;   COLPOSCOPY     PARATHYROIDECTOMY N/A 01/17/2023   Procedure: NECK EXPLORATION WITH PARATHYROIDECTOMY;  Surgeon: Darnell Level, MD;  Location: [facility] ORS;  Service: General;  Laterality: N/A;   RIGHT/LEFT HEART CATH AND CORONARY ANGIOGRAPHY N/A 05/11/2021   Procedure: RIGHT/LEFT HEART CATH AND CORONARY ANGIOGRAPHY;  Surgeon: Dolores Patty, MD;  Location: [facility] INVASIVE CV LAB;  Service: Cardiovascular;  Laterality: N/A;   TEE WITHOUT CARDIOVERSION N/A 05/12/2021   Procedure: TRANSESOPHAGEAL ECHOCARDIOGRAM (TEE);  Surgeon: Dolores Patty, MD;  Location: [facility] ENDOSCOPY;  Service: Cardiovascular;  Laterality: N/A;   TUBAL LIGATION  2001   Social History: She is divorced.  She has 1 son and 3 daughters.  Unemployed.  Non-smoker.  No alcohol use.  No drug use.  Family History: family history includes Cervical cancer in her mother; Diabetes in her sister; Hypertension in her brother, brother, father, and mother; Stroke in her father; Uterine cancer in her maternal grandmother.  Known family history of esophageal, gastric or colon cancer.  Allergies  Allergen Reactions   Lisinopril-Hydrochlorothiazide Anaphylaxis    Angioedema      Outpatient Encounter Medications as of 02/18/2023  Medication Sig   acetaminophen (TYLENOL) 500 MG tablet Take 1,000 mg by mouth every 6 (six) hours as needed for moderate pain or headache.   apixaban (ELIQUIS) 5 MG TABS tablet Take 1 tablet (5 mg total) by mouth 2 (two) times daily.   azelastine (ASTELIN) 0.1 % nasal spray Place 1 spray into  both nostrils 2 (two) times daily. Use in each nostril as directed (Patient taking differently: Place 1 spray into both nostrils daily as needed for rhinitis. Use in each nostril as directed)   carvedilol (COREG) 6.25 MG tablet TAKE 1 TABLET BY MOUTH TWICE A DAY   Cholecalciferol (VITAMIN D3) 25 MCG (1000 UT) CAPS Take 2,000 Units by mouth daily in the afternoon.   dapagliflozin propanediol (FARXIGA) 10 MG TABS tablet Take 1 tablet (10 mg total) by mouth daily.   dextromethorphan-guaiFENesin (MUCINEX DM) 30-600 MG 12hr tablet Take 1 tablet by mouth 2 (two) times daily as needed (congestion).   gabapentin (NEURONTIN) 100 MG capsule Take 1 capsule (100 mg total) by mouth at bedtime. 100-300mg  daily  at bedtime (Patient taking differently: Take 100 mg by mouth at bedtime as needed (pain).)   hydrALAZINE (APRESOLINE) 50 MG tablet Take 1 tablet (50 mg total) by mouth 3 (three) times daily.   hydrocortisone (ANUSOL-HC) 2.5 % rectal cream Place 1 Application rectally 2 (two) times daily.   isosorbide mononitrate (IMDUR) 30 MG 24 hr tablet Take 1 tablet (30 mg total) by mouth daily.   KLOR-CON M20 20 MEQ tablet TAKE 1 TABLET BY MOUTH AS NEEDED( TAKE WHEN TAKING TORSEMIDE)   lidocaine (LIDODERM) 5 % Place 1 patch onto the skin daily. Remove & Discard patch within 12 hours or as directed by MD (Patient not taking: Reported on 01/07/2023)   losartan (COZAAR) 50 MG tablet Take 1 tablet (50 mg total) by mouth in the morning and at bedtime.   norethindrone-ethinyl estradiol (MICROGESTIN) 1-20 MG-MCG tablet Take 1 tablet by mouth daily. (Patient not taking: Reported on 01/07/2023)   nystatin (MYCOSTATIN/NYSTOP) powder Apply 1 Application topically 3 (three) times daily.   predniSONE (DELTASONE) 50 MG tablet Take 1 pill daily for 5 days (Patient not taking: Reported on 01/07/2023)   Probiotic CHEW Chew 2 capsules by mouth daily.   spironolactone (ALDACTONE) 25 MG tablet Take 1 tablet (25 mg total) by mouth daily.    torsemide (DEMADEX) 20 MG tablet TAKE 1 TABLET BY MOUTH AS NEEDED(WHEN WEIGHT IS UP OR NOTES SWELLING)   traMADol (ULTRAM) 50 MG tablet Take 1 tablet (50 mg total) by mouth every 6 (six) hours as needed for moderate pain.   triamcinolone cream (KENALOG) 0.1 % Apply 1 Application topically 2 (two) times daily. (Patient taking differently: Apply 1 Application topically daily as needed (Rash).)   No facility-administered encounter medications on file as of 02/18/2023.   REVIEW OF SYSTEMS:  Gen: Denies fever, sweats or chills. No weight loss.  CV: Denies chest pain, palpitations or edema. Resp: Denies cough, shortness of breath of hemoptysis.  GI: No GERD symptoms. See HPI. GU: Denies urinary burning, blood in urine, increased urinary frequency or incontinence. MS:+ Arthritis.  Derm: Denies rash, itchiness, skin lesions or unhealing ulcers. Psych: Denies depression, anxiety, memory loss or confusion. Heme: Denies bruising, easy bleeding. Neuro:  Denies headaches, dizziness or paresthesias. Endo:  Denies any problems with DM, thyroid or adrenal function.  PHYSICAL EXAM: BP 110/60   Pulse 77   Ht 5\' 3"  (1.6 m)   Wt 228 lb (103.4 kg)   BMI 40.39 kg/m   General: 52 year old female in no acute distress. Head: Normocephalic and atraumatic. Eyes:  Sclerae non-icteric, conjunctive pink. Ears: Normal auditory acuity. Mouth: Dentition intact. No ulcers or lesions.  Neck: Supple, no lymphadenopathy or thyromegaly.  Lungs: Clear bilaterally to auscultation without wheezes, crackles or rhonchi. Heart: Regular rate and rhythm. Soft systolic murmur. No rub or gallop appreciated.  Abdomen: Soft, nontender, nondistended. No masses. No hepatosplenomegaly. Normoactive bowel sounds x 4 quadrants.  Rectal: Posterior sentinel tag with associated fissure, tender, oozed a small amount of bright red blood on exam.  No stool.  Internal hemorrhoids palpated without prolapse. Shell CMA present during exam.   Musculoskeletal: Symmetrical with no gross deformities. Skin: Warm and dry. No rash or lesions on visible extremities. Extremities: No edema. Neurological: Alert oriented x 4, no focal deficits.  Psychological:  Alert and cooperative. Normal mood and affect.  ASSESSMENT AND PLAN:  52 year old female with no rectal pain and bleeding, likely due to posterior anal fissure -Avoid constipation -MiraLAX nightly as tolerated -Benefiber 1 tablespoon daily as  tolerated -Diltiazem 2%/lidocaine 5% ointment apply a small amount inside the anal opening to the external anal area 3 times daily for 6 weeks -Colonoscopy benefits and risks discussed including risk with sedation, risk of bleeding, perforation and infection  -Patient wishes to follow-up in 6 weeks, to schedule a colonoscopy at that time -Patient will review home records to further verify if she underwent a colonoscopy 5+ years ago by GI in [facility], records not available in care everywhere or epic  Colon cancer screening.  Colonoscopy 2008 without colon polyps.  Patient is unsure but possibly underwent a colonoscopy 5+ years ago by GI in [facility] which she believes removed a few polyps.  Records not available.  See plan above.  Chronic macrocytic anemia. Hg 11.4. MCB 104.2. No alcohol use. -CBC, IBC + ferritin and B12 level   Renal insufficiency. Cr 1.52. GFR 41.  -CMP -Follow up with PCP  History of atrial fibrillation s/p ablation 07/2022 on Eliquis. CHA2DS2VASc score of 3.   CHF with LV EF 55 to 60% per ECHO 07/2022  S/P parathyroidectomy 01/2023  ADDENDUM: Today's lab results show a low vitamin B12 level of 196.  Normal iron, TIBC and ferritin level with low saturation ratio 16.4.  Creatine level 1.39 with downtrending GFR 43.  Anemia multifactorial including B12 deficiency, iron deficiency, chronic GI blood loss/rectal bleeding and kidney disease.  I called the patient and discussed these lab results and advised her to follow-up  with Dr. Okey Dupre to treat her B12 deficiency and for further renal evaluation.  To schedule a colonoscopy at the time of her 6-week follow-up as noted above and to also discuss scheduling an EGD to rule out atrophic gastritis in setting of B12 deficiency as well as rule out UGI etiology for IDA.        CC:  Myrlene Broker, *

## 2023-02-25 NOTE — Therapy (Signed)
OUTPATIENT PHYSICAL THERAPY TREATMENT NOTE + RECERTIFICATION   Patient Name: Christine Oneal MRN: [id number] DOB:[birth year], 52 y.o., female Today's Date: 02/28/2023  PCP: Myrlene Broker, MD REFERRING PROVIDER: Rodolph Bong, MD   END OF SESSION:   PT End of Session - 02/28/23 1135     Visit Number 16    Number of Visits 22    Authorization Type [employer]    Authorization Time Period 9 visits 01/10/23-03/10/23    Authorization - Visit Number 6    Authorization - Number of Visits 9    PT Start Time 1140    Activity Tolerance Patient tolerated treatment well    Behavior During Therapy [facility] for tasks assessed/performed                  Past Medical History:  Diagnosis Date   Abnormal Pap smear of cervix    Allergy    Anemia    Arthritis    Atrial fibrillation (HCC)    CHF (congestive heart failure) (HCC)    Dysrhythmia    A fib   Family history of adverse reaction to anesthesia    Fibroid    History of atrial fibrillation    History of kidney stones    Hyperparathyroidism (HCC)    Hypertension    Kidney stones    Leukocytosis, unspecified 10/18/2013   Obesity    Plantar fasciitis    Pneumonia    Past Surgical History:  Procedure Laterality Date   ATRIAL FIBRILLATION ABLATION N/A 08/03/2022   Procedure: ATRIAL FIBRILLATION ABLATION;  Surgeon: Lanier Prude, MD;  Location: [facility] INVASIVE CV LAB;  Service: Cardiovascular;  Laterality: N/A;   CARDIOVERSION N/A 05/12/2021   Procedure: CARDIOVERSION;  Surgeon: Dolores Patty, MD;  Location: [facility] ENDOSCOPY;  Service: Cardiovascular;  Laterality: N/A;   CARDIOVERSION N/A 05/29/2021   Procedure: CARDIOVERSION;  Surgeon: Dolores Patty, MD;  Location: [facility] ENDOSCOPY;  Service: Cardiovascular;  Laterality: N/A;   COLPOSCOPY     PARATHYROIDECTOMY N/A 01/17/2023   Procedure: NECK EXPLORATION WITH PARATHYROIDECTOMY;  Surgeon: Darnell Level, MD;  Location: [facility] ORS;  Service: General;  Laterality: N/A;    RIGHT/LEFT HEART CATH AND CORONARY ANGIOGRAPHY N/A 05/11/2021   Procedure: RIGHT/LEFT HEART CATH AND CORONARY ANGIOGRAPHY;  Surgeon: Dolores Patty, MD;  Location: [facility] INVASIVE CV LAB;  Service: Cardiovascular;  Laterality: N/A;   TEE WITHOUT CARDIOVERSION N/A 05/12/2021   Procedure: TRANSESOPHAGEAL ECHOCARDIOGRAM (TEE);  Surgeon: Dolores Patty, MD;  Location: [facility] ENDOSCOPY;  Service: Cardiovascular;  Laterality: N/A;   TUBAL LIGATION  2001   Patient Active Problem List   Diagnosis Date Noted   Right foot pain 12/09/2022   Urinary incontinence 08/23/2022   Chronic pain of both knees 08/23/2022   Gait instability 08/23/2022   Dysmenorrhea 05/14/2022   Left foot pain 05/14/2022   Encounter for general adult medical examination with abnormal findings 04/17/2022   Primary hyperparathyroidism (HCC) 11/25/2021   Chronic systolic CHF (congestive heart failure) (HCC) 05/26/2021   Unspecified atrial fibrillation (HCC) 05/04/2021   Anemia 09/04/2017   Morbid obesity (HCC) 09/03/2017   Reactive airway disease 09/03/2017   Vitamin D deficiency 09/03/2017   Essential hypertension 10/24/2007   Hemorrhoids 10/24/2007    REFERRING DIAG: M25.561,M25.562,G89.29 (ICD-10-CM) - Chronic pain of both knees M25.551 (ICD-10-CM) - Right hip pain (added 12/02/22)  THERAPY DIAG:  Chronic pain of both knees  Muscle weakness (generalized)  Other abnormalities of gait and mobility  Pain in right hip  Rationale for Evaluation and Treatment Rehabilitation  PERTINENT HISTORY: HTN, CHF, afib (pt states well controlled since ablation in January although does have some exertional SOB with activity)  PRECAUTIONS: fall hx, cardiac hx    SUBJECTIVE:                                                                                                                                                                                     SUBJECTIVE STATEMENT:    Pt states she is feeling better lately. Was  quite sore after the last couple sessions which she attributes to heavier activities and back to back treatment dates. States she is still having the most trouble with floor transfers and stairs. Notes improvements in her knee pain but still feels like she has some trouble fully bending.    PAIN:  Are you having pain: 0/10 (worst 9/10 in hips, pt attributes this to exacerbation) Location/description: right lateral hip to knee  Aggravating factors: stair navigation, standing/walking >59min, sleeping R side (wakes up 3x/night on average), sitting >78min Easing factors: rest, changing positions, medication, voltaren     OBJECTIVE: (objective measures completed at initial evaluation unless otherwise dated) DIAGNOSTIC FINDINGS:  Lumbar XR 10/22/22 IMPRESSION: 1. 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 2. Right kidney stone.   BIL knee XR February 2024 with degenerative changes, see EPIC for details  Hip/pelvis XR 11/26/22  IMPRESSION: Degenerative changes lower lumbar spine and both hips. No acute bony or joint abnormality.   PATIENT SURVEYS:  LEFS eval - 43/80 12/02/22: 34/80 12/30/22: 43/80  01/07/2023: 30/80  02/28/23: 31/80  PALPATION: Concordant tenderness B lat quads and TFL, no pain with gentle patellar mobs in all directions, no joint line or tendon tenderness    LOWER EXTREMITY ROM:      Active  Right eval Left eval Right 11/30/22 Left 11/30/22 R/L 12-16-22  Hip flexion         Hip extension         Hip internal rotation         Hip external rotation         Knee extension 0 0     Knee flexion 120 deg s 110 deg painless 120 114 120/120  (Blank rows = not tested) (Key: WFL = within functional limits not formally assessed, * = concordant pain, s = stiffness/stretching sensation, NT = not tested)  Comments:    LOWER EXTREMITY MMT:     MMT Right eval Left eval R/L 12/02/22 Rt / Lt 01/07/2023 Rt/LT 01/27/23 R/L 02/28/23  Hip flexion 4 3+ 4/4+ 4 / 4     Hip abduction (modified sitting) 4 4  5/5     Hip internal rotation     3/3+ 4- / 4 4 / 4 4+/4+  Hip external rotation     3+/4 4- / 4 4/ 4 5/5  Knee flexion 4+ 4+    5/5  Knee extension 4 *  4+  4+ / 5  5/5  Ankle dorsiflexion            (Blank rows = not tested) (Key: WFL = within functional limits not formally assessed, * = concordant pain, s = stiffness/stretching sensation, NT = not tested)  Comments:     FUNCTIONAL TESTS:  5xSTS: 14.66 sec w UE support from standard chair no increase in pain 12/02/22 5xSTS: 12.28 sec gentle UE support at thighs, hip pain  02/28/23: 14.19sec no UE support    GAIT: Distance walked: within clinic Assistive device utilized: None Level of assistance: Complete Independence Comments: reduced gait speed/cadence, widened BOS, reduced trunk rotation/arm swing   HIP EXAM 12/02/22: LLE negative FABER, FADDIR, distraction/compression RLE positive FADDIR for groin pain, FABER and compression irritate SI, distraction unrelieving       TODAY'S TREATMENT: [facility] Adult PT Treatment:                                                DATE: 02/28/23 Therapeutic Exercise: Nu step 3 min for ROM Suitcase carry 5# 3x79ft BIL cues for form and pacing, for muscular endurance 6 inch runner step up x6 BIL cues for posture  4 inch step up + green band TKE x8 BIL Seated red band march x10 BIL cues for form and pacing  Therapeutic Activity: MSK assessment + education LEFS + education Education/discussion re: progress with PT, symptom behavior as it affects activity tolerance, PT goals/POC   [facility] Adult PT Treatment:                                                DATE: 02/16/23 Therapeutic Exercise: Nustep L5 LE only x 5 minutes  6 inch step up with opp knee drive x 10 each , 1 UE 6 inch lateral step down x 10 each , 1 UE Dead lift from 6 inch step 2 x 10 15# Front squat tap 15# KB 10 x 2  1/2 lunge using 1 UE on pole x 10 each  Prone quad stretch with strap x 2 each     [facility] Adult PT Treatment:                                                DATE: 02/15/2023 Therapeutic Exercise: Stair training using 6 inch steps x 6 with proper  Modified lunge 1 x 5 bil with bosu one hand on table on hand on knee. Then repeat and both hands on knee lead knee 1 x 5 ea.  Dead lift from 8 inch step 2 x 10 15#  Updated HEP for mini lunge Manual Therapy: MTPR RLE x 2 with graduating pressure - Taught pt how to perform at home  Therapeutic Activity: Reviewed how to navigate up/ down steps reciprocally to maximize efficiencey and  reduce issues   [facility] Adult PT Treatment:                                                DATE: 02/01/23 Therapeutic Exercise: Seated march 2x10 BIL cues for posture and pacing Seated green band hip abduction 3x10  Standing cone taps x10 BIL cues for control and posture Lateral step up 2 inch step emphasis on pacing/control 2x8 BIL  Standing heel raise x10   [facility] Adult PT Treatment:                                                DATE: 01/27/23 Therapeutic Exercise: Nustep L5 x 5 min Standing hip abduction 10 x 2 each  Standing hip flexion alternating 10 x 2 each  Standing heel raise  Tandem and SLS on airex ( 20-30 sec SLS today)  STS , squat taps from mat x 12 Bridge x 12 S/L clam AROM  x15 each SLR x 10  each  Supine march with green band  Supine clam green  Ball squeeze  Bridge with ball squeeze    HOME EXERCISE PROGRAM: Access Code: [email or web address] URL: [email or web address] Date: 02/28/2023 Prepared by: Fransisco Hertz  Exercises - Seated Long Arc Quad  - 1 x daily - 7 x weekly - 2 sets - 10 reps - Seated March with Resistance  - 1 x daily - 7 x weekly - 2 sets - 10 reps - Sit to stand with control  - 1 x daily - 7 x weekly - 1 sets - 10 reps - Modified Thomas Stretch  - 1 x daily - 7 x weekly - 1 sets - 3 reps - 20-30 hold - Standing Quad Stretch with Towel and Arm Support  - 1 x daily - 7 x weekly - 1 sets - 2-3 reps -  30 sec hold - Standing Alternating Partial Lunge  - 1 x daily - 7 x weekly - 2 sets - 10 reps   ASSESSMENT: CLINICAL IMPRESSION: Pt arrives w/o pain although she endorses fairly significant exacerbation a little over a week ago, which she attributes to heavier activities and back to back treatment days - now back to baseline. Looking at LTG, pt with minimal progress in pain related goals although these questions are likely confounded by recent exacerbation. She objectively demonstrates improvements in LE strength compared to start of care and most recent measurements, continues with mild hip weakness. 5xSTS is within MCID (2.3sec) of prior measurement although today she is able to perform without UE assist and without pain. In discussion with pt, recommend extending dates on POC to accommodate remaining visits to continue working towards improved functional mobility with stairs and floor transfers. Tolerates remainder of session well with exercises and cues as above. No adverse events. Recommend continuing along current POC in order to address relevant deficits and improve functional tolerance. Pt departs today's session in no acute distress, all voiced questions/concerns addressed appropriately from PT perspective.        OBJECTIVE IMPAIRMENTS: decreased activity tolerance, decreased balance, decreased endurance, decreased mobility, difficulty walking, decreased ROM, decreased strength, and pain.    ACTIVITY LIMITATIONS: carrying, bending, sitting, standing, squatting, sleeping, stairs, transfers, and locomotion level  PARTICIPATION LIMITATIONS: meal prep, cleaning, laundry, community activity, and occupation   PERSONAL FACTORS: Time since onset of injury/illness/exacerbation and 3+ comorbidities: afib/CHF, HTN, MSK comorbidities (LBP)  are also affecting patient's functional outcome.      GOALS: Goals reviewed with patient? No given time constraints   SHORT TERM GOALS: Target date: = LTG    Pt will demonstrate appropriate understanding and performance of initially prescribed HEP in order to facilitate improved independence with management of symptoms.  Baseline: HEP provided on eval 11/22/22: requires cues  12/03/22: Independent with initial HEP Goal status: MET   2. Pt will score greater than or equal to 52 on LEFS in order to demonstrate improved perception of function due to symptoms.             Baseline: 43/80  11/22/22: not tested due to absence from PT  12/02/22: 34/80 12/30/22: 43/80 01/07/2023: 30/80 02/28/23: 31/80              Goal status: ONGOING   LONG TERM GOALS: Target date: 03/28/2023  (Updated  02/28/23)  Pt will score 60 or  greater  on LEFS in order to demonstrate improved perception of function due to symptoms.  Baseline: 43/80 12/02/22: 34/80  12/30/22: 43/80 01/07/2023: 30/80 02/28/23: 31/80  Goal status:ONGOING   2.  Pt will demonstrate at least 120 degrees of knee flexion ROM bilaterally in order to facilitate improved mechanics with gait/transfers. Baseline: see ROM chart above 12/02/22: NT given hip re-eval  12-16-22 See chart R 120, L 120 Goal status: MET   3.  Pt will demonstrate appropriate performance of final prescribed HEP in order to facilitate improved self-management of symptoms post-discharge.              Baseline: initial HEP prescribed  12/02/22: good HEP adherence reported  01/07/2023: good HEP adherence reported  02/28/23: good HEP adherence reported with exacerbation last week              Goal status: ONGOING     4.  Pt will be able to perform 5xSTS in less than or equal to 12sec without UE support in order to demonstrate reduced fall risk and improved functional independence (MCID 5xSTS = 2.3 sec). Baseline: 14.66sec w UE support 12/02/22: 12sec w gentle UE support 01/07/2023: 12 seconds without UE support Goal status: MET   5. Pt will report at least 50% decrease in overall pain levels in past week in order to facilitate improved  tolerance to basic ADLs/mobility.              Baseline: 2-7/10  12/02/22: 5-6/10 hip at worst, 1-2/10 knees at worst  12-16-22  1/10 today and just a specific pain on lateral thigh  12/30/22: knees and hip can reach 5-6/10  01/07/2023: worst 5-6/10  02/16/23: this week pain reports only quad soreness, no pain  02/28/23: 9/10 at worst              Goal status: ONGOING   6. Pt will report/demonstrate ability to stand/walk for up to 45 min with less than 3 pt increase in resting pain in order to promote improved community navigation and exercise tolerance for overall health/QOL.              Baseline: pain >59min of standing/walking  12/02/22: pain >4min in hip/groin, knees no longer limiting walking most of the time 01/07/2023: patient reports 15-20 minutes on a good day 02/16/23: 15-20 min 02/28/23: ~15 min  Goal status: ONGOING  7. Pt will demo R hip ER/IR MMT of at least 4/5 in order to demonstrate improved functional strength.  Baseline: see MMT chart above  01/07/2023: 4-/5 MMT right hip ER/IR 01/27/23: 4/5  Goal status: MET     PLAN: updated 02/28/23     PT FREQUENCY:  2x/week     PT DURATION: 4 weeks     PLANNED INTERVENTIONS: Therapeutic exercises, Therapeutic activity, Neuromuscular re-education, Balance training, Gait training, Patient/Family education, Self Care, Joint mobilization, Aquatic Therapy, Dry Needling, Cryotherapy, Moist heat, Taping, Manual therapy, and Re-evaluation   PLAN FOR NEXT SESSION: review/update HEP PRN. Quad activation BIL, rotational hip stability, gradual progression to more functional strengthening. Pt wants to work towards floor transfers and more stairs    Ashley Murrain PT, DPT 02/28/2023 12:46 PM    Check all possible CPT codes: 78295 - PT Re-evaluation, 97110- Therapeutic Exercise, [phone]- Neuro Re-education, [phone] - Gait Training, [phone] - Manual Therapy, [phone] - Therapeutic Activities, [phone] - Self Care, [phone] - Physical performance  training, and [phone] - Aquatic therapy    Check all conditions that are expected to impact treatment: Musculoskeletal disorders   If treatment provided at initial evaluation, no treatment charged due to lack of authorization.

## 2023-02-28 ENCOUNTER — Ambulatory Visit: Payer: Medicaid Other | Admitting: Family Medicine

## 2023-02-28 ENCOUNTER — Encounter: Payer: Self-pay | Admitting: Physical Therapy

## 2023-02-28 ENCOUNTER — Ambulatory Visit: Payer: Medicaid Other | Admitting: Physical Therapy

## 2023-02-28 DIAGNOSIS — M25551 Pain in right hip: Secondary | ICD-10-CM | POA: Diagnosis not present

## 2023-02-28 DIAGNOSIS — M6281 Muscle weakness (generalized): Secondary | ICD-10-CM

## 2023-02-28 DIAGNOSIS — G8929 Other chronic pain: Secondary | ICD-10-CM

## 2023-02-28 DIAGNOSIS — M25561 Pain in right knee: Secondary | ICD-10-CM | POA: Diagnosis not present

## 2023-02-28 DIAGNOSIS — R2689 Other abnormalities of gait and mobility: Secondary | ICD-10-CM | POA: Diagnosis not present

## 2023-02-28 DIAGNOSIS — M25562 Pain in left knee: Secondary | ICD-10-CM | POA: Diagnosis not present

## 2023-03-02 ENCOUNTER — Ambulatory Visit (INDEPENDENT_AMBULATORY_CARE_PROVIDER_SITE_OTHER): Payer: Medicaid Other | Admitting: Internal Medicine

## 2023-03-02 ENCOUNTER — Encounter: Payer: Self-pay | Admitting: Internal Medicine

## 2023-03-02 VITALS — BP 120/68 | HR 68 | Ht 63.0 in | Wt 229.0 lb

## 2023-03-02 DIAGNOSIS — K5904 Chronic idiopathic constipation: Secondary | ICD-10-CM

## 2023-03-02 DIAGNOSIS — E538 Deficiency of other specified B group vitamins: Secondary | ICD-10-CM | POA: Insufficient documentation

## 2023-03-02 DIAGNOSIS — N1832 Chronic kidney disease, stage 3b: Secondary | ICD-10-CM

## 2023-03-02 DIAGNOSIS — K5900 Constipation, unspecified: Secondary | ICD-10-CM | POA: Insufficient documentation

## 2023-03-02 DIAGNOSIS — N3946 Mixed incontinence: Secondary | ICD-10-CM

## 2023-03-02 MED ORDER — POLYETHYLENE GLYCOL 3350 17 GM/SCOOP PO POWD: 17.0000 g | Freq: Two times a day (BID) | ORAL | 11 refills | Status: DC | PRN

## 2023-03-02 MED ORDER — CYANOCOBALAMIN 1000 MCG/ML IJ SOLN
1000.0000 ug | Freq: Once | INTRAMUSCULAR | Status: AC
Start: 2023-03-02 — End: 2023-03-02
  Administered 2023-03-02: 1000 ug via INTRAMUSCULAR

## 2023-03-02 MED ORDER — MIRABEGRON ER 50 MG PO TB24: 50.0000 mg | ORAL_TABLET | Freq: Every day | ORAL | 1 refills | Status: DC

## 2023-03-02 NOTE — Assessment & Plan Note (Signed)
Reviewed CKD with her and GFR recent 43 which is stable from prior.

## 2023-03-02 NOTE — Assessment & Plan Note (Signed)
Given B12 shot today and will plan weekly times 4 then start oral 1000 mcg daily replacement.

## 2023-03-02 NOTE — Progress Notes (Signed)
   Subjective:   Patient ID: Christine Oneal, female    DOB: [birth year], 52 y.o.   MRN: [id number]  HPI The patient is a 52 YO female coming in for follow up and new low B12 levels, incontinence.  Review of Systems  Constitutional: Negative.   HENT: Negative.    Eyes: Negative.   Respiratory:  Negative for cough, chest tightness and shortness of breath.   Cardiovascular:  Negative for chest pain, palpitations and leg swelling.  Gastrointestinal:  Negative for abdominal distention, abdominal pain, constipation, diarrhea, nausea and vomiting.  Genitourinary:  Positive for frequency and urgency.  Musculoskeletal: Negative.   Skin: Negative.   Neurological: Negative.   Psychiatric/Behavioral: Negative.      Objective:  Physical Exam Constitutional:      Appearance: She is well-developed. She is obese.  HENT:     Head: Normocephalic and atraumatic.  Cardiovascular:     Rate and Rhythm: Normal rate and regular rhythm.  Pulmonary:     Effort: Pulmonary effort is normal. No respiratory distress.     Breath sounds: Normal breath sounds. No wheezing or rales.  Abdominal:     General: Bowel sounds are normal. There is no distension.     Palpations: Abdomen is soft.     Tenderness: There is no abdominal tenderness. There is no rebound.  Musculoskeletal:     Cervical back: Normal range of motion.  Skin:    General: Skin is warm and dry.  Neurological:     Mental Status: She is alert and oriented to person, place, and time.     Coordination: Coordination normal.     Vitals:   03/02/23 0920  BP: 120/68  Pulse: 68  TempSrc: Oral  SpO2: 100%  Weight: 229 lb (103.9 kg)  Height: 5\' 3"  (1.6 m)    Assessment & Plan:  B12 given at visit

## 2023-03-02 NOTE — Patient Instructions (Addendum)
We have given you the B12 shot today and will do this weekly for 4 shots. For the B12 you want to take oral 1000 mcg daily so make sure after the shots you start this.  We have sent in myrbetriq to take 1 pill daily for the incontinence and this can take 1 month to help fully. Let us know if not covered or not helping.  We have sent in the generic miralax to the pharmacy.

## 2023-03-02 NOTE — Assessment & Plan Note (Signed)
Rx myrbetriq 50 mg daily. If not covered can try oxybutynin. Discussed benefits of this and potential side effects of this and alternative.

## 2023-03-02 NOTE — Assessment & Plan Note (Signed)
Likely her constipation is partially medication induced. Rx miralax 17 g BID prn constipation and she will benefit from this being covered by insurance due to high out of pocket cost of this otc.

## 2023-03-03 ENCOUNTER — Other Ambulatory Visit (HOSPITAL_COMMUNITY): Payer: Self-pay

## 2023-03-03 ENCOUNTER — Telehealth: Payer: Self-pay | Admitting: Pharmacy Technician

## 2023-03-03 NOTE — Telephone Encounter (Signed)
Pharmacy Patient Advocate Encounter   Received notification from CoverMyMeds that prior authorization for Mirabegron ER 50MG  er tablets is required/requested.   Insurance verification completed.   The patient is insured through [facility] .   Per test claim:  Oxybutynin, Solifenacin and Gala Murdoch is preferred by the insurance.  If suggested medication is appropriate, Please send in a new RX and discontinue this one. If not, please advise as to why it's not appropriate so that we may request a Prior Authorization.

## 2023-03-08 ENCOUNTER — Ambulatory Visit: Payer: Medicaid Other | Admitting: Physical Therapy

## 2023-03-08 ENCOUNTER — Encounter: Payer: Self-pay | Admitting: Physical Therapy

## 2023-03-08 DIAGNOSIS — M6281 Muscle weakness (generalized): Secondary | ICD-10-CM | POA: Diagnosis not present

## 2023-03-08 DIAGNOSIS — G8929 Other chronic pain: Secondary | ICD-10-CM

## 2023-03-08 DIAGNOSIS — R2689 Other abnormalities of gait and mobility: Secondary | ICD-10-CM | POA: Diagnosis not present

## 2023-03-08 DIAGNOSIS — M25551 Pain in right hip: Secondary | ICD-10-CM | POA: Diagnosis not present

## 2023-03-08 DIAGNOSIS — M25561 Pain in right knee: Secondary | ICD-10-CM | POA: Diagnosis not present

## 2023-03-08 DIAGNOSIS — M25562 Pain in left knee: Secondary | ICD-10-CM | POA: Diagnosis not present

## 2023-03-08 NOTE — Therapy (Signed)
OUTPATIENT PHYSICAL THERAPY TREATMENT NOTE  DISCHARGE   PHYSICAL THERAPY DISCHARGE SUMMARY  Visits from Start of Care: 16  Current functional level related to goals / functional outcomes: SEE GOALS, LEFS 52/80   Remaining deficits: See assessment   Education / Equipment: HEP, theraband, posture, lifting mechanics.    Patient agrees to discharge. Patient goals were partially met. Patient is being discharged due to being pleased with the current functional level.    Patient Name: Christine Oneal MRN: [id number] DOB:1970/07/31, 52 y.o., female Today's Date: 03/08/2023  PCP: Myrlene Broker, MD REFERRING PROVIDER: Rodolph Bong, MD   END OF SESSION:     PT End of Session - 03/08/23 1153     Visit Number 17    Number of Visits 22    Date for PT Re-Evaluation 03/28/23    Authorization Type [employer]    Authorization Time Period 9 visits 01/10/23-03/10/23    Authorization - Visit Number 7    Authorization - Number of Visits 9    PT Start Time 1018    PT Stop Time 1100    PT Time Calculation (min) 42 min                Past Medical History:  Diagnosis Date   Abnormal Pap smear of cervix    Allergy    Anemia    Arthritis    Atrial fibrillation (HCC)    CHF (congestive heart failure) (HCC)    Dysrhythmia    A fib   Family history of adverse reaction to anesthesia    Fibroid    History of atrial fibrillation    History of kidney stones    Hyperparathyroidism (HCC)    Hypertension    Kidney stones    Leukocytosis, unspecified 10/18/2013   Obesity    Plantar fasciitis    Pneumonia    Past Surgical History:  Procedure Laterality Date   ATRIAL FIBRILLATION ABLATION N/A 08/03/2022   Procedure: ATRIAL FIBRILLATION ABLATION;  Surgeon: Lanier Prude, MD;  Location: [facility] IN[facility]ASIVE [facility];  Service: Cardiovascular;  Laterality: N/A;   CARDIOVERSION N/A 05/12/2021   Procedure: CARDIOVERSION;  Surgeon: Dolores Patty, MD;  Location: [facility]  ENDOSCOPY;  Service: Cardiovascular;  Laterality: N/A;   CARDIOVERSION N/A 05/29/2021   Procedure: CARDIOVERSION;  Surgeon: Dolores Patty, MD;  Location: [facility] ENDOSCOPY;  Service: Cardiovascular;  Laterality: N/A;   COLPOSCOPY     PARATHYROIDECTOMY N/A 01/17/2023   Procedure: NECK EXPLORATION WITH PARATHYROIDECTOMY;  Surgeon: Darnell Level, MD;  Location: [facility] ORS;  Service: General;  Laterality: N/A;   RIGHT/LEFT HEART CATH AND CORONARY ANGIOGRAPHY N/A 05/11/2021   Procedure: RIGHT/LEFT HEART CATH AND CORONARY ANGIOGRAPHY;  Surgeon: Dolores Patty, MD;  Location: [facility] INVASIVE CV LAB;  Service: Cardiovascular;  Laterality: N/A;   TEE WITHOUT CARDIOVERSION N/A 05/12/2021   Procedure: TRANSESOPHAGEAL ECHOCARDIOGRAM (TEE);  Surgeon: Dolores Patty, MD;  Location: [facility] ENDOSCOPY;  Service: Cardiovascular;  Laterality: N/A;   TUBAL LIGATION  2001   Patient Active Problem List   Diagnosis Date Noted   Right foot pain 12/09/2022   Urinary incontinence 08/23/2022   Chronic pain of both knees 08/23/2022   Gait instability 08/23/2022   Dysmenorrhea 05/14/2022   Left foot pain 05/14/2022   Encounter for general adult medical examination with abnormal findings 04/17/2022   Primary hyperparathyroidism (HCC) 11/25/2021   Chronic systolic CHF (congestive heart failure) (HCC) 05/26/2021   Unspecified atrial fibrillation (HCC) 05/04/2021   Anemia  09/04/2017   Morbid obesity (HCC) 09/03/2017   Reactive airway disease 09/03/2017   Vitamin D deficiency 09/03/2017   Essential hypertension 10/24/2007   Hemorrhoids 10/24/2007    REFERRING DIAG: M25.561,M25.562,G89.29 (ICD-10-CM) - Chronic pain of both knees M25.551 (ICD-10-CM) - Right hip pain (added 12/02/22)  THERAPY DIAG:  Chronic pain of both knees  Muscle weakness (generalized)  Other abnormalities of gait and mobility  Pain in right hip  Rationale for Evaluation and Treatment Rehabilitation  PERTINENT HISTORY: HTN, CHF, afib (pt  states well controlled since ablation in January although does have some exertional SOB with activity)  PRECAUTIONS: fall hx, cardiac hx    SUBJECTIVE:                                                                                                                                                                                     SUBJECTIVE STATEMENT:    "Was sore previously that was noted in the last session but has since improved and not pain today."   PAIN:  Are you having pain: 0/10 (worst 9/10 in hips, pt attributes this to exacerbation) Location/description: right lateral hip to knee  Aggravating factors: stair navigation, standing/walking >41min, sleeping R side (wakes up 3x/night on average), sitting >51min Easing factors: rest, changing positions, medication, voltaren     OBJECTIVE: (objective measures completed at initial evaluation unless otherwise dated) DIAGNOSTIC FINDINGS:  Lumbar XR 10/22/22 IMPRESSION: 1. 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 2. Right kidney stone.   BIL knee XR February 2024 with degenerative changes, see EPIC for details  Hip/pelvis XR 11/26/22  IMPRESSION: Degenerative changes lower lumbar spine and both hips. No acute bony or joint abnormality.   PATIENT SURVEYS:  LEFS eval - 43/80 12/02/22: 34/80 12/30/22: 43/80  01/07/2023: 30/80  02/28/23: 31/80  03/08/23: 52/80  PALPATION: Concordant tenderness B lat quads and TFL, no pain with gentle patellar mobs in all directions, no joint line or tendon tenderness    LOWER EXTREMITY ROM:      Active  Right eval Left eval Right 11/30/22 Left 11/30/22 R/L 12-16-22  Hip flexion         Hip extension         Hip internal rotation         Hip external rotation         Knee extension 0 0     Knee flexion 120 deg s 110 deg painless 120 114 120/120  (Blank rows = not tested) (Key: WFL = within functional limits not formally assessed, * = concordant pain, s =  stiffness/stretching sensation, NT = not tested)  Comments:    LOWER  EXTREMITY MMT:     MMT Right eval Left eval R/L 12/02/22 Rt / Lt 01/07/2023 Rt/LT 01/27/23 R/L 02/28/23  Hip flexion 4 3+ 4/4+ 4 / 4    Hip abduction (modified sitting) 4 4 5/5     Hip internal rotation     3/3+ 4- / 4 4 / 4 4+/4+  Hip external rotation     3+/4 4- / 4 4/ 4 5/5  Knee flexion 4+ 4+    5/5  Knee extension 4 *  4+  4+ / 5  5/5  Ankle dorsiflexion            (Blank rows = not tested) (Key: WFL = within functional limits not formally assessed, * = concordant pain, s = stiffness/stretching sensation, NT = not tested)  Comments:     FUNCTIONAL TESTS:  5xSTS: 14.66 sec w UE support from standard chair no increase in pain 12/02/22 5xSTS: 12.28 sec gentle UE support at thighs, hip pain  02/28/23: 14.19sec no UE support    GAIT: Distance walked: within clinic Assistive device utilized: None Level of assistance: Complete Independence Comments: reduced gait speed/cadence, widened BOS, reduced trunk rotation/arm swing   HIP EXAM 12/02/22: LLE negative FABER, FADDIR, distraction/compression RLE positive FADDIR for groin pain, FABER and compression irritate SI, distraction unrelieving       TODAY'S TREATMENT: [facility] Adult PT Treatment:                                                DATE: 03/08/2023 Therapeutic Exercise: Reviewed and updated HEP today Therapeutic Activity: LEFS + education  [facility] Adult PT Treatment:                                                DATE: 02/28/23 Therapeutic Exercise: Nu step 3 min for ROM Suitcase carry 5# 3x17ft BIL cues for form and pacing, for muscular endurance 6 inch runner step up x6 BIL cues for posture  4 inch step up + green band TKE x8 BIL Seated red band march x10 BIL cues for form and pacing  Therapeutic Activity: MSK assessment + education LEFS + education Education/discussion re: progress with PT, symptom behavior as it affects activity tolerance, PT goals/POC    [facility] Adult PT Treatment:                                                DATE: 02/16/23 Therapeutic Exercise: Nustep L5 LE only x 5 minutes  6 inch step up with opp knee drive x 10 each , 1 UE 6 inch lateral step down x 10 each , 1 UE Dead lift from 6 inch step 2 x 10 15# Front squat tap 15# KB 10 x 2  1/2 lunge using 1 UE on pole x 10 each  Prone quad stretch with strap x 2 each    [facility] Adult PT Treatment:  DATE: 02/15/2023 Therapeutic Exercise: Stair training using 6 inch steps x 6 with proper  Modified lunge 1 x 5 bil with bosu one hand on table on hand on knee. Then repeat and both hands on knee lead knee 1 x 5 ea.  Dead lift from 8 inch step 2 x 10 15#  Updated HEP for mini lunge Manual Therapy: MTPR RLE x 2 with graduating pressure - Taught pt how to perform at home  Therapeutic Activity: Reviewed how to navigate up/ down steps reciprocally to maximize efficiencey and reduce issues   [facility] Adult PT Treatment:                                                DATE: 02/01/23 Therapeutic Exercise: Seated march 2x10 BIL cues for posture and pacing Seated green band hip abduction 3x10  Standing cone taps x10 BIL cues for control and posture Lateral step up 2 inch step emphasis on pacing/control 2x8 BIL  Standing heel raise x10   [facility] Adult PT Treatment:                                                DATE: 01/27/23 Therapeutic Exercise: Nustep L5 x 5 min Standing hip abduction 10 x 2 each  Standing hip flexion alternating 10 x 2 each  Standing heel raise  Tandem and SLS on airex ( 20-30 sec SLS today)  STS , squat taps from mat x 12 Bridge x 12 S/L clam AROM  x15 each SLR x 10  each  Supine march with green band  Supine clam green  Ball squeeze  Bridge with ball squeeze    HOME EXERCISE PROGRAM: Access Code: [email or web address] URL: [email or web address] Date: 03/08/2023 Prepared by: Lulu Riding  Program  Notes increase your strengthening / endurance. The last 3 reps of every set should be the most challenging.  Rest at least 30 seconds between each set. as you increase your reps and it gets easier, once you can do 4 sets of 15-20 reps without challenge then increase our resistance to the next color band. Once you increase your resistance you may need to drop your reps again.   Exercises - Seated Long Arc Quad  - 1 x daily - 7 x weekly - 3-4 sets - 15-20 reps - Seated March with Resistance  - 1 x daily - 7 x weekly - 3-4 sets - 1-20 reps - Sit to stand with control  - 1 x daily - 7 x weekly - 3 -4 sets - 15-20 reps - Modified Thomas Stretch  - 1 x daily - 7 x weekly - 1 sets - 3 reps - 20-30 hold - Standing Quad Stretch with Towel and Arm Support  - 1 x daily - 7 x weekly - 1 sets - 2-3 reps - 30 sec hold - Standing Alternating Partial Lunge  - 1 x daily - 7 x weekly - 2 sets - 10 reps - Deadlift with Resistance  - 1 x daily - 7 x weekly - 3-4 sets - 15-20 reps - Standing Heel Raise with Support  - 1 x daily - 7 x weekly - 3 - 4 sets - 20 reps   ASSESSMENT: CLINICAL IMPRESSION: Mrs  Rise has made excellent progress with physical therapy reporting no pain today. She reports she does have some occasional soreness with direct pressure along her quads but overall has significantly improveed. She has met or partially met all goals. Extensively reviewed her HEP and discussed progression of strengthening and walking program to maximize strengthening. She verbalized understanding and agreed with discharged.     OBJECTIVE IMPAIRMENTS: decreased activity tolerance, decreased balance, decreased endurance, decreased mobility, difficulty walking, decreased ROM, decreased strength, and pain.    ACTIVITY LIMITATIONS: carrying, bending, sitting, standing, squatting, sleeping, stairs, transfers, and locomotion level   PARTICIPATION LIMITATIONS: meal prep, cleaning, laundry, community activity, and  occupation   PERSONAL FACTORS: Time since onset of injury/illness/exacerbation and 3+ comorbidities: afib/CHF, HTN, MSK comorbidities (LBP)  are also affecting patient's functional outcome.      GOALS: Goals reviewed with patient? No given time constraints   SHORT TERM GOALS: Target date: = LTG   Pt will demonstrate appropriate understanding and performance of initially prescribed HEP in order to facilitate improved independence with management of symptoms.  Baseline: HEP provided on eval 11/22/22: requires cues  12/03/22: Independent with initial HEP Goal status: MET   2. Pt will score greater than or equal to 52 on LEFS in order to demonstrate improved perception of function due to symptoms.             Baseline: 43/80  11/22/22: not tested due to absence from PT  12/02/22: 34/80 12/30/22: 43/80 01/07/2023: 30/80 02/28/23: 31/80  03/08/2023: 52/80             Goal status: MET 03/08/2023   LONG TERM GOALS: Target date: 03/28/2023  (Updated  02/28/23)  Pt will score 60 or  greater  on LEFS in order to demonstrate improved perception of function due to symptoms.  Baseline: 43/80 12/02/22: 34/80  12/30/22: 43/80 01/07/2023: 30/80 02/28/23: 31/80  03/01/2023: 52/80 Goal status: Not MET 03/08/2023   2.  Pt will demonstrate at least 120 degrees of knee flexion ROM bilaterally in order to facilitate improved mechanics with gait/transfers. Baseline: see ROM chart above 12/02/22: NT given hip re-eval  12-16-22 See chart R 120, L 120 Goal status: MET   3.  Pt will demonstrate appropriate performance of final prescribed HEP in order to facilitate improved self-management of symptoms post-discharge.              Baseline: initial HEP prescribed  12/02/22: good HEP adherence reported  01/07/2023: good HEP adherence reported  02/28/23: good HEP adherence reported with exacerbation last week              Goal status: MET 03/08/2023     4.  Pt will be able to perform 5xSTS in less than or equal to 12sec  without UE support in order to demonstrate reduced fall risk and improved functional independence (MCID 5xSTS = 2.3 sec). Baseline: 14.66sec w UE support 12/02/22: 12sec w gentle UE support 01/07/2023: 12 seconds without UE support Goal status: MET   5. Pt will report at least 50% decrease in overall pain levels in past week in order to facilitate improved tolerance to basic ADLs/mobility.              Baseline: 2-7/10  12/02/22: 5-6/10 hip at worst, 1-2/10 knees at worst  12-16-22  1/10 today and just a specific pain on lateral thigh  12/30/22: knees and hip can reach 5-6/10  01/07/2023: worst 5-6/10  02/16/23: this week pain reports only quad soreness, no  pain  02/28/23: 9/10 at worst   03/08/23: 0/10             Goal status: MET 03/08/2023   6. Pt will report/demonstrate ability to stand/walk for up to 45 min with less than 3 pt increase in resting pain in order to promote improved community navigation and exercise tolerance for overall health/QOL.              Baseline: pain >14min of standing/walking  12/02/22: pain >63min in hip/groin, knees no longer limiting walking most of the time 01/07/2023: patient reports 15-20 minutes on a good day 02/16/23: 15-20 min 02/28/23: ~15 min  03/08/23: 15-20  3-4 /10 pain             Goal status: Partially met 03/08/2023  7. Pt will demo R hip ER/IR MMT of at least 4/5 in order to demonstrate improved functional strength.  Baseline: see MMT chart above  01/07/2023: 4-/5 MMT right hip ER/IR 01/27/23: 4/5  Goal status: MET     PLAN: updated 02/28/23     PT FREQUENCY:  2x/week     PT DURATION: 4 weeks     PLANNED INTERVENTIONS: Therapeutic exercises, Therapeutic activity, Neuromuscular re-education, Balance training, Gait training, Patient/Family education, Self Care, Joint mobilization, Aquatic Therapy, Dry Needling, Cryotherapy, Moist heat, Taping, Manual therapy, and Re-evaluation   PLAN FOR NEXT SESSION: review/update HEP PRN. Quad activation BIL,  rotational hip stability, gradual progression to more functional strengthening. Pt wants to work towards floor transfers and more stairs    [employer] PT, DPT, LAT, ATC  03/08/23  12:01 PM

## 2023-03-09 ENCOUNTER — Ambulatory Visit: Payer: Medicaid Other

## 2023-03-10 ENCOUNTER — Ambulatory Visit (INDEPENDENT_AMBULATORY_CARE_PROVIDER_SITE_OTHER): Payer: Medicaid Other | Admitting: *Deleted

## 2023-03-10 ENCOUNTER — Ambulatory Visit: Payer: Medicaid Other | Admitting: Physical Therapy

## 2023-03-10 DIAGNOSIS — E538 Deficiency of other specified B group vitamins: Secondary | ICD-10-CM

## 2023-03-10 MED ORDER — CYANOCOBALAMIN 1000 MCG/ML IJ SOLN
1000.0000 ug | Freq: Once | INTRAMUSCULAR | Status: AC
Start: 2023-03-10 — End: 2023-03-10
  Administered 2023-03-10: 1000 ug via INTRAMUSCULAR

## 2023-03-10 NOTE — Progress Notes (Signed)
Patient here for her b12 injection. Given in her left deltoid. Patient tolerated well.

## 2023-03-11 ENCOUNTER — Other Ambulatory Visit (HOSPITAL_COMMUNITY): Payer: Self-pay

## 2023-03-15 ENCOUNTER — Ambulatory Visit: Payer: Medicaid Other | Admitting: Physical Therapy

## 2023-03-17 ENCOUNTER — Ambulatory Visit (INDEPENDENT_AMBULATORY_CARE_PROVIDER_SITE_OTHER): Payer: Medicaid Other

## 2023-03-17 DIAGNOSIS — E538 Deficiency of other specified B group vitamins: Secondary | ICD-10-CM

## 2023-03-17 MED ORDER — CYANOCOBALAMIN 1000 MCG/ML IJ SOLN
1000.0000 ug | Freq: Once | INTRAMUSCULAR | Status: AC
Start: 2023-03-17 — End: 2023-03-17
  Administered 2023-03-17: 1000 ug via INTRAMUSCULAR

## 2023-03-17 NOTE — Progress Notes (Signed)
Pt was given B12 injection with no complications.

## 2023-03-18 DIAGNOSIS — R32 Unspecified urinary incontinence: Secondary | ICD-10-CM | POA: Diagnosis not present

## 2023-03-18 DIAGNOSIS — I10 Essential (primary) hypertension: Secondary | ICD-10-CM | POA: Diagnosis not present

## 2023-03-24 ENCOUNTER — Ambulatory Visit (INDEPENDENT_AMBULATORY_CARE_PROVIDER_SITE_OTHER): Payer: Medicaid Other

## 2023-03-24 DIAGNOSIS — E538 Deficiency of other specified B group vitamins: Secondary | ICD-10-CM | POA: Diagnosis not present

## 2023-03-24 DIAGNOSIS — Z23 Encounter for immunization: Secondary | ICD-10-CM

## 2023-03-24 MED ORDER — CYANOCOBALAMIN 1000 MCG/ML IJ SOLN
1000.0000 ug | Freq: Once | INTRAMUSCULAR | Status: AC
Start: 2023-03-24 — End: 2023-03-24
  Administered 2023-03-24: 1000 ug via INTRAMUSCULAR

## 2023-03-24 NOTE — Progress Notes (Signed)
Pt here for monthly B12 injection per Dr Okey Dupre.   B12 given IM and pt tolerated injection well.  Patient has completed her 4 weekly injections. Patient was advised to report to the office immediately if she noticed any adverse reactions.   Patient was also given her flu vaccine today. She tolerated it very well.

## 2023-04-04 ENCOUNTER — Other Ambulatory Visit (HOSPITAL_COMMUNITY): Payer: Self-pay

## 2023-04-06 ENCOUNTER — Other Ambulatory Visit (HOSPITAL_COMMUNITY): Payer: Self-pay | Admitting: Family Medicine

## 2023-04-07 ENCOUNTER — Other Ambulatory Visit (HOSPITAL_COMMUNITY): Payer: Self-pay

## 2023-04-07 DIAGNOSIS — I5022 Chronic systolic (congestive) heart failure: Secondary | ICD-10-CM

## 2023-04-07 MED ORDER — CARVEDILOL 6.25 MG PO TABS
6.2500 mg | ORAL_TABLET | Freq: Two times a day (BID) | ORAL | 0 refills | Status: DC
Start: 2023-04-07 — End: 2023-10-21

## 2023-04-25 LAB — SURGICAL PATHOLOGY

## 2023-04-28 ENCOUNTER — Telehealth: Payer: Self-pay | Admitting: Nurse Practitioner

## 2023-04-28 NOTE — Telephone Encounter (Signed)
Contacted pt & pt would like an alternative medication because Diltiazem was not covered. Patient has an upcoming appointment with you on 10/24.

## 2023-04-28 NOTE — Telephone Encounter (Signed)
Inbound call from patient's stating the cream medication that was called into her pharmacy in August. Stated she was not able to pick up medication due to insurance not covering it. Requesting for an alternative. Also requesting a call back. Please advise, thank you.

## 2023-04-29 ENCOUNTER — Other Ambulatory Visit (HOSPITAL_COMMUNITY): Payer: Self-pay

## 2023-04-29 DIAGNOSIS — I5022 Chronic systolic (congestive) heart failure: Secondary | ICD-10-CM

## 2023-04-29 MED ORDER — HYDRALAZINE HCL 50 MG PO TABS
50.0000 mg | ORAL_TABLET | Freq: Three times a day (TID) | ORAL | 0 refills | Status: DC
Start: 2023-04-29 — End: 2023-06-21

## 2023-04-29 NOTE — Telephone Encounter (Signed)
DD, please instruct patient to apply a small amount of Desitin and Recticare (both purchased otc) inside the anal opening and to the external anal area three times daily as needed for anal or hemorrhoidal irritation/bleeding tid for now and we can determine if other treatment required when I see her at the time of her office visit next week. THX.

## 2023-04-29 NOTE — Telephone Encounter (Signed)
Instructed patient on Colleen's recommendations. Patient verbalized understanding.

## 2023-05-02 DIAGNOSIS — R32 Unspecified urinary incontinence: Secondary | ICD-10-CM | POA: Diagnosis not present

## 2023-05-02 DIAGNOSIS — I5022 Chronic systolic (congestive) heart failure: Secondary | ICD-10-CM | POA: Diagnosis not present

## 2023-05-02 DIAGNOSIS — I10 Essential (primary) hypertension: Secondary | ICD-10-CM | POA: Diagnosis not present

## 2023-05-05 ENCOUNTER — Ambulatory Visit: Payer: Medicaid Other | Admitting: Internal Medicine

## 2023-05-05 ENCOUNTER — Ambulatory Visit: Payer: Medicaid Other | Admitting: Nurse Practitioner

## 2023-05-05 VITALS — BP 112/70 | HR 71 | Temp 97.9°F | Ht 63.0 in | Wt 231.0 lb

## 2023-05-05 DIAGNOSIS — M549 Dorsalgia, unspecified: Secondary | ICD-10-CM

## 2023-05-05 DIAGNOSIS — M542 Cervicalgia: Secondary | ICD-10-CM | POA: Diagnosis not present

## 2023-05-05 MED ORDER — METHOCARBAMOL 500 MG PO TABS: 500.0000 mg | ORAL_TABLET | Freq: Four times a day (QID) | ORAL | 0 refills | Status: DC

## 2023-05-05 NOTE — Patient Instructions (Addendum)
        Medications changes include :   methocarbamol ( muscle relaxer)  Try topical muscle medications like biofreeze.      Return if symptoms worsen or fail to improve.

## 2023-05-05 NOTE — Progress Notes (Signed)
Subjective:    Patient ID: Christine Oneal, female    DOB: December 14, 1970, 52 y.o.   MRN: [id number]      HPI Gerrica is here for  Chief Complaint  Patient presents with   Neck Pain    Neck and mid-back pain    Neck ( right posterior neck) and central mid back pain.   Pain is 5-6/10 at times.   Symptoms started over a month ago.  Hurts with activity but ok with sleeping and when sitting with support on her back.    No N/T or radiation of the pain.  No weakness.  Has taken tylenol - not helpful.   Has stretched a little.    No upper back pain.   Medications and allergies reviewed with patient and updated if appropriate.  Current Outpatient Medications on File Prior to Visit  Medication Sig Dispense Refill   acetaminophen (TYLENOL) 500 MG tablet Take 1,000 mg by mouth every 6 (six) hours as needed for moderate pain or headache.     AMBULATORY NON FORMULARY MEDICATION Medication Name: Diltiazem 2%/Lidocaine 2%   Using your index finger apply a small amount of medication inside the anal opening and to the external anal area tthree times daily x 6 weeks. 30 g 1   apixaban (ELIQUIS) 5 MG TABS tablet Take 1 tablet (5 mg total) by mouth 2 (two) times daily. 60 tablet 6   azelastine (ASTELIN) 0.1 % nasal spray Place 1 spray into both nostrils 2 (two) times daily. Use in each nostril as directed (Patient taking differently: Place 1 spray into both nostrils daily as needed for rhinitis. Use in each nostril as directed) 30 mL 12   carvedilol (COREG) 6.25 MG tablet Take 1 tablet (6.25 mg total) by mouth 2 (two) times daily. 180 tablet 0   Cholecalciferol (VITAMIN D3) 25 MCG (1000 UT) CAPS Take 2,000 Units by mouth daily in the afternoon.     dapagliflozin propanediol (FARXIGA) 10 MG TABS tablet Take 1 tablet (10 mg total) by mouth daily. 90 tablet 1   dextromethorphan-guaiFENesin (MUCINEX DM) 30-600 MG 12hr tablet Take 1 tablet by mouth 2 (two) times daily as needed (congestion).      gabapentin (NEURONTIN) 100 MG capsule Take 1 capsule (100 mg total) by mouth at bedtime. 100-300mg  daily at bedtime (Patient taking differently: Take 100 mg by mouth at bedtime as needed (pain).) 60 capsule 2   hydrALAZINE (APRESOLINE) 50 MG tablet Take 1 tablet (50 mg total) by mouth 3 (three) times daily. 90 tablet 0   hydrocortisone (ANUSOL-HC) 2.5 % rectal cream Place 1 Application rectally 2 (two) times daily. 30 g 11   isosorbide mononitrate (IMDUR) 30 MG 24 hr tablet Take 1 tablet (30 mg total) by mouth daily. 90 tablet 3   KLOR-CON M20 20 MEQ tablet TAKE 1 TABLET BY MOUTH AS NEEDED( TAKE WHEN TAKING TORSEMIDE) 30 tablet 3   lidocaine (LIDODERM) 5 % Place 1 patch onto the skin daily. Remove & Discard patch within 12 hours or as directed by MD 30 patch 0   losartan (COZAAR) 50 MG tablet Take 1 tablet (50 mg total) by mouth in the morning and at bedtime. 60 tablet 5   mirabegron ER (MYRBETRIQ) 50 MG TB24 tablet Take 1 tablet (50 mg total) by mouth daily. 90 tablet 1   nystatin (MYCOSTATIN/NYSTOP) powder Apply 1 Application topically 3 (three) times daily. 60 g 11   polyethylene glycol powder (GLYCOLAX/MIRALAX) 17 GM/SCOOP powder Take 17  g by mouth 2 (two) times daily as needed. 3350 g 11   Probiotic CHEW Chew 2 capsules by mouth daily.     spironolactone (ALDACTONE) 25 MG tablet Take 1 tablet (25 mg total) by mouth daily. 90 tablet 1   torsemide (DEMADEX) 20 MG tablet TAKE 1 TABLET BY MOUTH AS NEEDED(WHEN WEIGHT IS UP OR NOTES SWELLING) 30 tablet 3   triamcinolone cream (KENALOG) 0.1 % Apply 1 Application topically 2 (two) times daily. (Patient taking differently: Apply 1 Application topically daily as needed (Rash).) 30 g 0   No current facility-administered medications on file prior to visit.    Review of Systems  Musculoskeletal:  Positive for back pain and neck pain.  Neurological:  Negative for weakness, numbness and headaches.       Objective:   Vitals:   05/05/23 0949  BP:  112/70  Pulse: 71  Temp: 97.9 F (36.6 C)  SpO2: 100%   BP Readings from Last 3 Encounters:  05/05/23 112/70  03/02/23 120/68  02/18/23 110/60   Wt Readings from Last 3 Encounters:  05/05/23 231 lb (104.8 kg)  03/02/23 229 lb (103.9 kg)  02/18/23 228 lb (103.4 kg)   Body mass index is 40.92 kg/m.    Physical Exam Constitutional:      General: She is not in acute distress.    Appearance: Normal appearance. She is not ill-appearing.  HENT:     Head: Normocephalic and atraumatic.  Musculoskeletal:     Right lower leg: No edema.     Left lower leg: No edema.     Comments: Right posterior tenderness - minimal.  Full ROM of neck, no c-spine tenderness.  No upper back tenderness.  No mid back pain with palpation - pain is typically mid central back under bra strap  Skin:    General: Skin is warm and dry.     Findings: No rash.  Neurological:     General: No focal deficit present.     Mental Status: She is alert.     Sensory: No sensory deficit.     Motor: No weakness.            Assessment & Plan:    Neck pain, mid back pain  -  Acute Started one month ago - intermittent No radiculopathy Worse with activity, better with rest Likely msk  Unable to take nsaids Continue tylenol Start methocarbamol 500 mg 4 times daily prn Heat, stretching If no improvement  - can refer to PT

## 2023-05-06 ENCOUNTER — Telehealth: Payer: Medicaid Other | Admitting: Internal Medicine

## 2023-05-06 NOTE — Progress Notes (Signed)
Error

## 2023-05-12 IMAGING — DX DG CHEST 1V PORT
1 series · 1 of 1 positions shown · non-contrast
Comparison: Chest radiograph dated 02/13/2018.

CLINICAL DATA: Shortness of breath.

EXAM:
PORTABLE CHEST 1 VIEW

[chest]
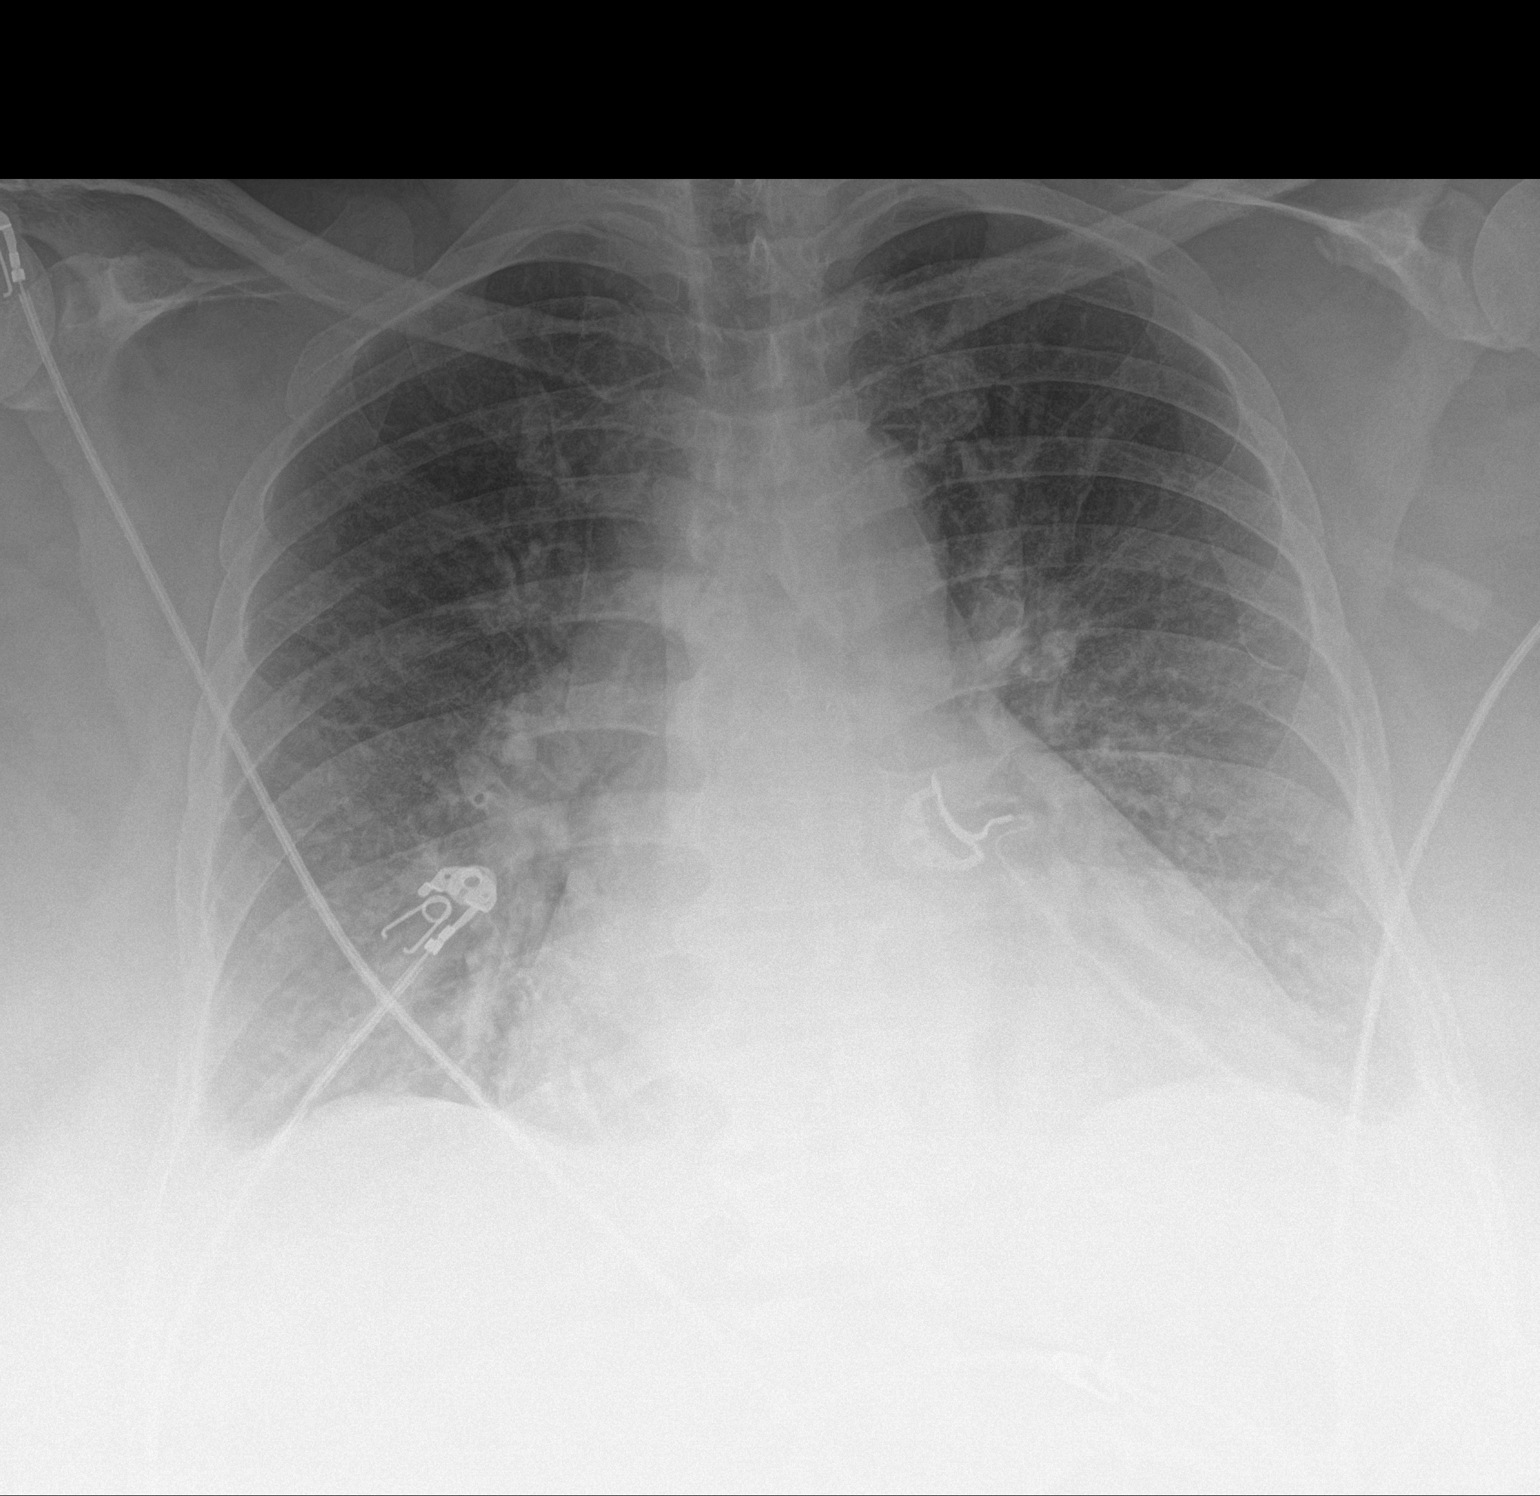

[1 of 1 positions shown; findings below may reference images not displayed]

FINDINGS: Cardiomegaly with vascular congestion. No focal consolidation or
pneumothorax. Small bilateral pleural effusions. No acute osseous
pathology.
IMPRESSION: Cardiomegaly with vascular congestion and small bilateral pleural
effusions.

## 2023-05-14 IMAGING — DX DG CHEST 1V PORT
1 series · 1 of 1 positions shown · non-contrast
Comparison: Chest x-ray 05/03/2021.

CLINICAL DATA: Status post central line placement.

EXAM:
PORTABLE CHEST 1 VIEW

[chest]
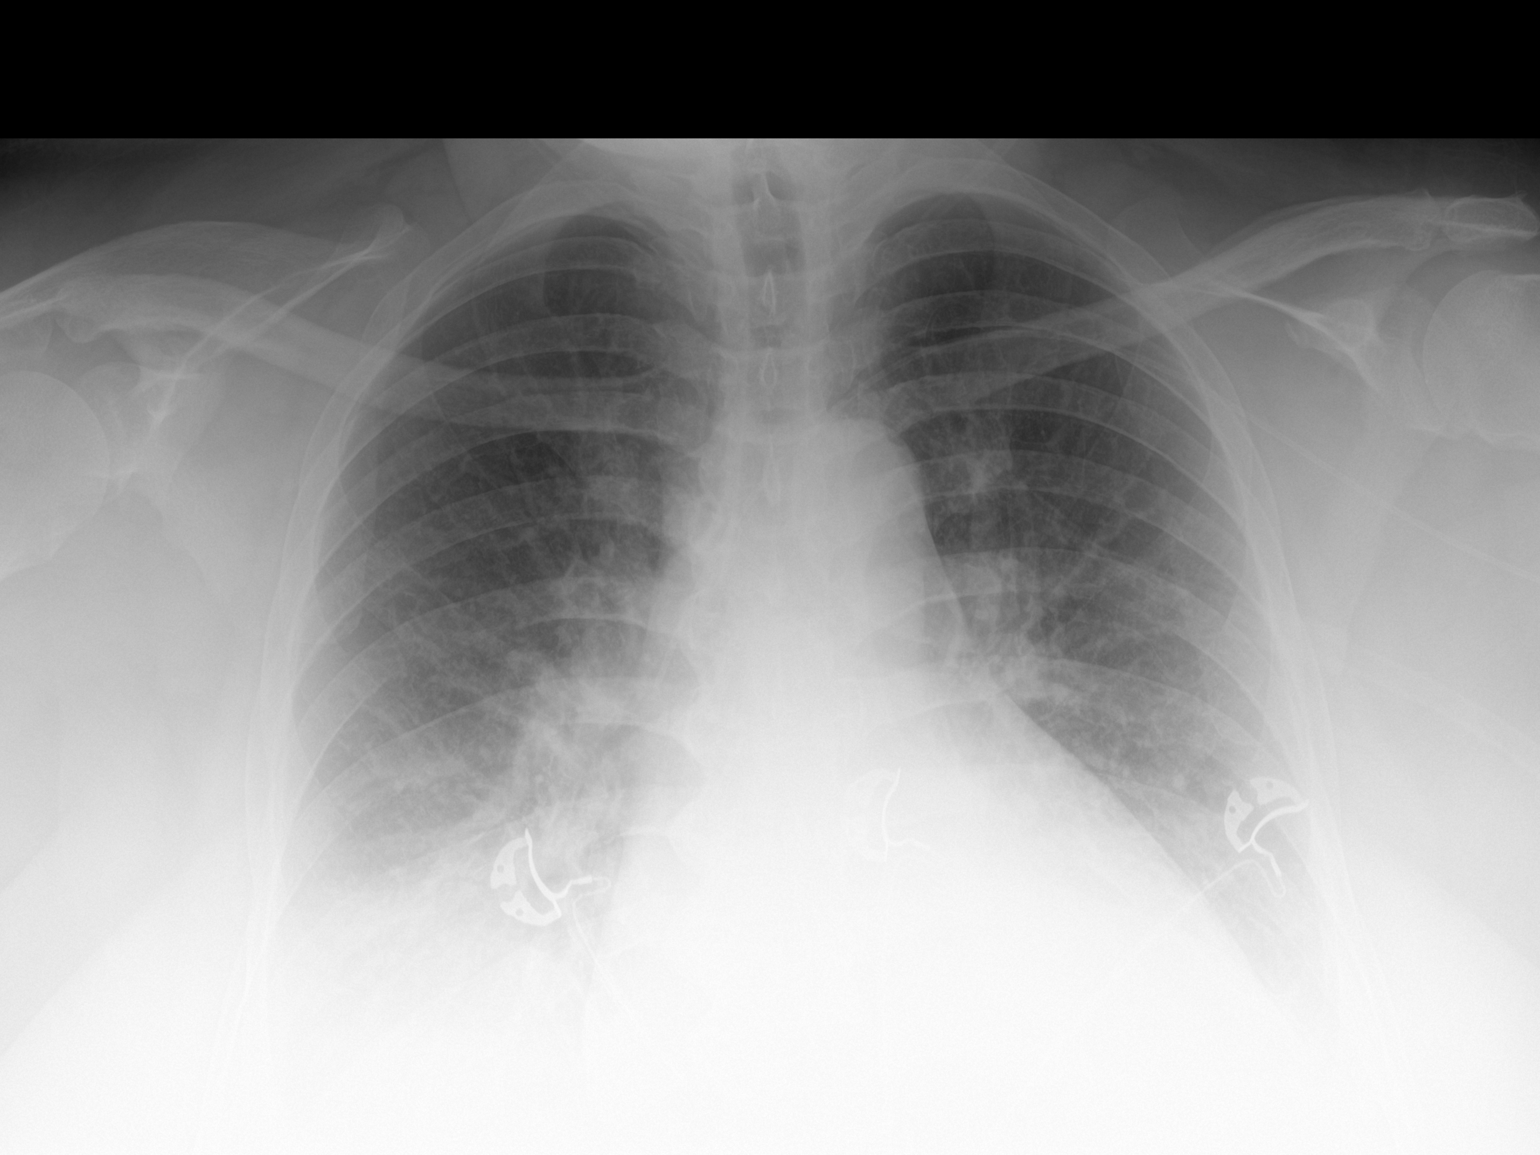

[1 of 1 positions shown; findings below may reference images not displayed]

FINDINGS: There is a new left-sided central venous catheter with distal tip
projecting over the mid SVC. There is no pneumothorax.

Small pleural effusions and bibasilar opacities have increased. The
heart is enlarged, unchanged. Osseous structures are unremarkable.
IMPRESSION: 1. Left-sided central venous catheter tip projects over the mid SVC.
2. Increasing bilateral pleural effusions and bibasilar infiltrates.
3. Stable cardiomegaly.

## 2023-05-18 ENCOUNTER — Ambulatory Visit: Payer: Medicaid Other

## 2023-05-18 ENCOUNTER — Ambulatory Visit: Payer: Medicaid Other | Admitting: Family Medicine

## 2023-05-18 VITALS — BP 118/74 | HR 80 | Ht 63.0 in | Wt 233.0 lb

## 2023-05-18 DIAGNOSIS — M47814 Spondylosis without myelopathy or radiculopathy, thoracic region: Secondary | ICD-10-CM | POA: Diagnosis not present

## 2023-05-18 DIAGNOSIS — M542 Cervicalgia: Secondary | ICD-10-CM | POA: Diagnosis not present

## 2023-05-18 DIAGNOSIS — M546 Pain in thoracic spine: Secondary | ICD-10-CM

## 2023-05-18 DIAGNOSIS — M419 Scoliosis, unspecified: Secondary | ICD-10-CM | POA: Diagnosis not present

## 2023-05-18 DIAGNOSIS — G8929 Other chronic pain: Secondary | ICD-10-CM

## 2023-05-18 DIAGNOSIS — M549 Dorsalgia, unspecified: Secondary | ICD-10-CM | POA: Diagnosis not present

## 2023-05-18 DIAGNOSIS — M47812 Spondylosis without myelopathy or radiculopathy, cervical region: Secondary | ICD-10-CM | POA: Diagnosis not present

## 2023-05-18 MED ORDER — CYCLOBENZAPRINE HCL 10 MG PO TABS: 5.0000 mg | ORAL_TABLET | Freq: Three times a day (TID) | ORAL | 1 refills | Status: AC | PRN

## 2023-05-18 NOTE — Patient Instructions (Addendum)
Thank you for coming in today.   Please get an Xray today before you leave   Try using a heating pad  I've referred you to Physical Therapy.  Let us know if you don't hear from them in one week.  TENS UNIT: This is helpful for muscle pain and spasm.   Search and Purchase a TENS 7000 2nd edition at  www.tenspros.com or www.Amazon.com It should be less than $30.     TENS unit instructions: Do not shower or bathe with the unit on Turn the unit off before removing electrodes or batteries If the electrodes lose stickiness add a drop of water to the electrodes after they are disconnected from the unit and place on plastic sheet. If you continued to have difficulty, call the TENS unit company to purchase more electrodes. Do not apply lotion on the skin area prior to use. Make sure the skin is clean and dry as this will help prolong the life of the electrodes. After use, always check skin for unusual red areas, rash or other skin difficulties. If there are any skin problems, does not apply electrodes to the same area. Never remove the electrodes from the unit by pulling the wires. Do not use the TENS unit or electrodes other than as directed. Do not change electrode placement without consultating your therapist or physician. Keep 2 fingers with between each electrode. Wear time ratio is 2:1, on to off times.    For example on for 30 minutes off for 15 minutes and then on for 30 minutes off for 15 minutes

## 2023-05-18 NOTE — Progress Notes (Signed)
[name], PhD, LAT, ATC acting as a scribe for [name], MD.  Christine Oneal is a 52 y.o. female who presents to [facility] at [facility] today for neck and upper back pain. Pt was previously seen by Dr. Denyse Amass on 12/27/22 for R hip and L foot pain.  Today, pt c/o neck and upper back pain ongoing for over a month. No injury. Pt locates pain to the midline of her neck, into trapz, bilateral periscapular region, and midline around her bra-strap line.   Radiates: no Aggravates: holding something on her shoulder Treatments tried: Tylenol, stretching, methocarbamol,   Pertinent review of systems: No fevers or chills  Relevant historical information: Heart failure.  Atrial fibrillation.  Kidney disease.   Exam:  BP 118/74   Pulse 80   Ht 5\' 3"  (1.6 m)   Wt 233 lb (105.7 kg)   SpO2 99%   BMI 41.27 kg/m  General: Well Developed, well nourished, and in no acute distress.   MSK: C-spine: Normal appearing Nontender palpation spinal midline.  Tender palpation bilateral trapezius. Normal cervical motion.  Upper extremity strength is intact.  T-spine: Normal-appearing Nontender palpation midline.  Tender palpation perispinal musculature. Normal thoracic motion.   Lab and Radiology Results  X-ray images C-spine and T-spine obtained today personally and independently interpreted  C-spine: Loss of cervical lordosis.  Multilevel degenerative changes present.  No acute fractures are visible.  Surgical clips are present in the left side of the mid neck soft tissue.  T-spine: Minimal thoracic scoliosis convex right.  Mild degenerative changes midportion thoracic spine.  No acute fractures are visible.  Await formal radiology review  Assessment and Plan: 52 y.o. female with chronic neck and upper back pain thought to be muscle dysfunction with some degenerative component.  Plan for trial of physical therapy heating pad and TENS unit.  She was prescribed  methocarbamol which has not been very helpful.  Will try cyclobenzaprine instead.  Check back in 6 weeks.  Limited home exercise program reviewed in clinic today prior to discharge.   PDMP not reviewed this encounter. Orders Placed This Encounter  Procedures   DG Cervical Spine 2 or 3 views    Standing Status:   Future    Number of Occurrences:   1    Standing Expiration Date:   05/17/2024    Order Specific Question:   Reason for Exam (SYMPTOM  OR DIAGNOSIS REQUIRED)    Answer:   eval neck pain    Order Specific Question:   Is patient pregnant?    Answer:   No    Order Specific Question:   Preferred imaging location?    Answer:   [facility]   DG Thoracic Spine 2 View    Standing Status:   Future    Number of Occurrences:   1    Standing Expiration Date:   05/17/2024    Order Specific Question:   Reason for Exam (SYMPTOM  OR DIAGNOSIS REQUIRED)    Answer:   eval neck pain    Order Specific Question:   Is patient pregnant?    Answer:   No    Order Specific Question:   Preferred imaging location?    Answer:   [facility]   Ambulatory referral to Physical Therapy    Referral Priority:   Routine    Referral Type:   Physical Medicine    Referral Reason:   Specialty Services Required  Requested Specialty:   Physical Therapy   Meds ordered this encounter  Medications   cyclobenzaprine (FLEXERIL) 10 MG tablet    Sig: Take 0.5-1 tablets (5-10 mg total) by mouth 3 (three) times daily as needed for muscle spasms.    Dispense:  90 tablet    Refill:  1     Discussed warning signs or symptoms. Please see discharge instructions. Patient expresses understanding.   The above documentation has been reviewed and is accurate and complete [name], M.D.

## 2023-05-18 NOTE — Progress Notes (Signed)
Cervical spine x-ray shows mild arthritis changes.

## 2023-05-18 NOTE — Progress Notes (Signed)
Thoracic spine x-ray shows some arthritis changes

## 2023-05-19 ENCOUNTER — Ambulatory Visit: Payer: Medicaid Other | Admitting: Internal Medicine

## 2023-05-27 ENCOUNTER — Ambulatory Visit: Payer: Medicaid Other | Admitting: Internal Medicine

## 2023-05-27 ENCOUNTER — Encounter: Payer: Self-pay | Admitting: Internal Medicine

## 2023-05-27 VITALS — BP 110/70 | HR 89 | Ht 63.0 in | Wt 231.0 lb

## 2023-05-27 DIAGNOSIS — Z9089 Acquired absence of other organs: Secondary | ICD-10-CM | POA: Diagnosis not present

## 2023-05-27 DIAGNOSIS — Z9889 Other specified postprocedural states: Secondary | ICD-10-CM | POA: Diagnosis not present

## 2023-05-27 LAB — CALCIUM: Calcium: 9.3 mg/dL (ref 8.4–10.5)

## 2023-05-27 LAB — ALBUMIN: Albumin: 3.9 g/dL (ref 3.5–5.2)

## 2023-05-27 LAB — VITAMIN D 25 HYDROXY (VIT D DEFICIENCY, FRACTURES): VITD: 45.31 ng/mL (ref 30.00–100.00)

## 2023-05-27 NOTE — Therapy (Signed)
OUTPATIENT PHYSICAL THERAPY CERVICAL EVALUATION   Patient Name: Christine Oneal MRN: [id number] DOB:[birth year], 52 y.o., female Today's Date: 05/30/2023  END OF SESSION:  PT End of Session - 05/30/23 0800     Visit Number 1    Number of Visits 9    Date for PT Re-Evaluation 07/25/23    Authorization Type [employer]    Authorization Time Period auth tbd, 17 visits used prior    PT Start Time 0801    PT Stop Time 0840    PT Time Calculation (min) 39 min    Activity Tolerance Patient tolerated treatment well    Behavior During Therapy [facility] for tasks assessed/performed             Past Medical History:  Diagnosis Date   Abnormal Pap smear of cervix    Allergy    Anemia    Arthritis    Atrial fibrillation (HCC)    CHF (congestive heart failure) (HCC)    Dysrhythmia    A fib   Family history of adverse reaction to anesthesia    Fibroid    History of atrial fibrillation    History of kidney stones    Hyperparathyroidism (HCC)    Hypertension    Kidney stones    Leukocytosis, unspecified 10/18/2013   Obesity    Plantar fasciitis    Pneumonia    Past Surgical History:  Procedure Laterality Date   ATRIAL FIBRILLATION ABLATION N/A 08/03/2022   Procedure: ATRIAL FIBRILLATION ABLATION;  Surgeon: Lanier Prude, MD;  Location: [facility] INVASIVE CV LAB;  Service: Cardiovascular;  Laterality: N/A;   CARDIOVERSION N/A 05/12/2021   Procedure: CARDIOVERSION;  Surgeon: Dolores Patty, MD;  Location: [facility] ENDOSCOPY;  Service: Cardiovascular;  Laterality: N/A;   CARDIOVERSION N/A 05/29/2021   Procedure: CARDIOVERSION;  Surgeon: Dolores Patty, MD;  Location: [facility] ENDOSCOPY;  Service: Cardiovascular;  Laterality: N/A;   COLPOSCOPY     PARATHYROIDECTOMY N/A 01/17/2023   Procedure: NECK EXPLORATION WITH PARATHYROIDECTOMY;  Surgeon: Darnell Level, MD;  Location: [facility] ORS;  Service: General;  Laterality: N/A;   RIGHT/LEFT HEART CATH AND CORONARY ANGIOGRAPHY N/A 05/11/2021    Procedure: RIGHT/LEFT HEART CATH AND CORONARY ANGIOGRAPHY;  Surgeon: Dolores Patty, MD;  Location: [facility] INVASIVE CV LAB;  Service: Cardiovascular;  Laterality: N/A;   TEE WITHOUT CARDIOVERSION N/A 05/12/2021   Procedure: TRANSESOPHAGEAL ECHOCARDIOGRAM (TEE);  Surgeon: Dolores Patty, MD;  Location: [facility] ENDOSCOPY;  Service: Cardiovascular;  Laterality: N/A;   TUBAL LIGATION  2001   Patient Active Problem List   Diagnosis Date Noted   B12 deficiency 03/02/2023   Stage 3b chronic kidney disease (CKD) (HCC) 03/02/2023   Constipation 03/02/2023   Right foot pain 12/09/2022   Urinary incontinence 08/23/2022   Chronic pain of both knees 08/23/2022   Gait instability 08/23/2022   Dysmenorrhea 05/14/2022   Left foot pain 05/14/2022   Encounter for general adult medical examination with abnormal findings 04/17/2022   Primary hyperparathyroidism (HCC) 11/25/2021   Chronic systolic CHF (congestive heart failure) (HCC) 05/26/2021   Unspecified atrial fibrillation (HCC) 05/04/2021   Anemia 09/04/2017   Morbid obesity (HCC) 09/03/2017   Reactive airway disease 09/03/2017   Vitamin D deficiency 09/03/2017   Essential hypertension 10/24/2007   Hemorrhoids 10/24/2007    PCP: Myrlene Broker, MD  REFERRING PROVIDER: Rodolph Bong, MD  REFERRING DIAG: [id number] (ICD-10-CM) - Chronic neck pain M54.6 (ICD-10-CM) - Pain in thoracic spine  THERAPY DIAG:  Cervicalgia  Pain  in thoracic spine  Muscle weakness (generalized)  Rationale for Evaluation and Treatment: Rehabilitation  ONSET DATE: September 2024  SUBJECTIVE:                                                                                                                                                                                                         SUBJECTIVE STATEMENT: Pain seems to be slowly worsening over the past month or so. Pt cannot recall any precipitating change in activity or injury, but notes she has  been busy with school and housework. No UE referral, occasionally gets headaches but not always. No N/T.  Notes her knee and her hip still bother her some with stairs and floor transfers, has been doing HEP since recent discharge.  Hand dominance: Right  PERTINENT HISTORY:  HTN, CHF, afib  PAIN:  Are you having pain: mild, 4/10 Location/description: neck and upper back, central and shoulder blades ; aching Best-worst over past week: 2-10/10  - aggravating factors: prolonged activity, standing, housework, walking >42min  - Easing factors: support (pillow), medication, lying down    PRECAUTIONS: cardiac hx   WEIGHT BEARING RESTRICTIONS: No  FALLS:  Has patient fallen in last 6 months? No  LIVING ENVIRONMENT: Lives with son in apartment w/ 2STE, no steps inside  OCCUPATION: doing online classes  PLOF: Independent with some limitations due to cardiac issues and pain  PATIENT GOALS: relieve back pain  NEXT MD VISIT: December 12th  OBJECTIVE:  Note: Objective measures were completed at Evaluation unless otherwise noted.  DIAGNOSTIC FINDINGS:  05/18/23 Thoracic/Cervical XR, refer to EPIC as needed, show degenerative changes per review of impression  PATIENT SURVEYS:  FOTO 63>69  COGNITION: Overall cognitive status: Within functional limits for tasks assessed  SENSATION: Denies sensory complaints  POSTURE: forward head, R UT elevated > L  PALPATION: Concordant tenderness BIL (L>R) upper trap, levator scap, rhomboids, cervical paraspinals; L deltoid tenderness  CERVICAL ROM:   Active ROM A/PROM (deg) eval  Flexion 100%  Extension <25% *  Right lateral flexion 50%  Left lateral flexion 50% *  Right rotation 46 deg *  Left rotation 46 deg *   (Blank rows = not tested) (Key: WFL = within functional limits not formally assessed, * = concordant pain, s = stiffness/stretching sensation, NT = not tested)   UPPER EXTREMITY ROM:  A/PROM Right eval Left eval   Shoulder flexion WFL painless WFL painless with L shoulder discomfort  Shoulder abduction    Shoulder internal rotation    Shoulder external rotation    Elbow flexion  Elbow extension    Wrist flexion    Wrist extension     (Blank rows = not tested) (Key: WFL = within functional limits not formally assessed, * = concordant pain, s = stiffness/stretching sensation, NT = not tested)  Comments:    UPPER EXTREMITY MMT:  MMT Right eval Left eval  Shoulder flexion 5 5  Shoulder extension    Shoulder abduction 5 4+ *  Shoulder extension    Shoulder internal rotation 5 5  Shoulder external rotation 5 4+ *  Elbow flexion    Elbow extension    Grip strength 40#  40#  (Blank rows = not tested)  (Key: WFL = within functional limits not formally assessed, * = concordant pain, s = stiffness/stretching sensation, NT = not tested)  Comments:   CERVICAL SPECIAL TESTS:  deferred  FUNCTIONAL TESTS:  See grip strength above  TODAY'S TREATMENT:                                                                                                                              [facility] Adult PT Treatment:                                                DATE: 05/30/23 Therapeutic Exercise: Scapular retraction x10, cues for reduced UT compensations UT stretch x30sec BIL cues for form and comfortable ROM HEP handout + education   PATIENT EDUCATION:  Education details: Pt education on PT impairments, prognosis, and POC. Informed consent. Rationale for interventions, safe/appropriate HEP performance Person educated: Patient Education method: Explanation, Demonstration, Tactile cues, Verbal cues Education comprehension: verbalized understanding, returned demonstration, verbal cues required, tactile cues required, and needs further education    HOME EXERCISE PROGRAM: Access Code: [email or web address] URL: [email or web address] Date: 05/30/2023 Prepared by: Fransisco Hertz  Exercises - Seated  Scapular Retraction  - 2-3 x daily - 1 sets - 10 reps - Seated Gentle Upper Trapezius Stretch  - 2-3 x daily - 1 sets - 1-2 reps - 30sec hold  ASSESSMENT:  CLINICAL IMPRESSION: Patient is a pleasant 52 y.o. woman who was seen today for physical therapy evaluation and treatment for neck/thoracic pain ongoing since around September. Endorses increased pain/limitation with typical household tasks and prolonged activity. On exam she demonstrates concordant limitations in cervical mobility and L GH strength, postural deficits as above, and concordant TTP to cervical/periscapular musculature. Tolerates HEP and exam well without adverse event. Recommend trial of skilled PT to address aforementioned deficits with aim of improving functional tolerance and reducing pain with typical activities. Pt departs today's session in no acute distress, all voiced concerns/questions addressed appropriately from PT perspective.      OBJECTIVE IMPAIRMENTS: decreased activity tolerance, decreased endurance, decreased mobility, difficulty walking, decreased ROM, decreased strength, impaired UE functional use, postural dysfunction, and pain.   ACTIVITY LIMITATIONS:  carrying, lifting, standing, and locomotion level  PARTICIPATION LIMITATIONS: meal prep, cleaning, laundry, and community activity  PERSONAL FACTORS: Time since onset of injury/illness/exacerbation and 3+ comorbidities: Afib, HTN, CHF  are also affecting patient's functional outcome.   REHAB POTENTIAL: Good  CLINICAL DECISION MAKING: Stable/uncomplicated  EVALUATION COMPLEXITY: Low   GOALS: Goals reviewed with patient? Yes  SHORT TERM GOALS: Target date: 06/27/2023 Pt will demonstrate appropriate understanding and performance of initially prescribed HEP in order to facilitate improved independence with management of symptoms.  Baseline: HEP provided on eval Goal status: INITIAL   2. Pt will score greater than or equal to 66 on FOTO in order to  demonstrate improved perception of function due to symptoms.  Baseline: 63  Goal status: INITIAL    LONG TERM GOALS: Target date: 07/25/2023 Pt will score 69 on FOTO in order to demonstrate improved perception of function due to symptoms. Baseline: 63 Goal status: INITIAL  2. Pt will demonstrate at least 50 degrees of active cervical rotation ROM in order to demonstrate improved environmental awareness and safety with driving.  Baseline: see ROM chart above Goal status: INITIAL  3. Pt will demonstrate at least 4+/5 shoulder abduction/ER MMT bilaterally for improved symmetry of UE strength and improved tolerance to functional movements.  Baseline: see MMT chart above Goal status: INITIAL   4. Pt will report/demonstrate ability to perform typical housework for at least 30 min with less than 2 point increase in neck/back pain on NPS in order to demonstrate improved tolerance to household tasks. Baseline: up to 10/10 Goal status: INITIAL   5. Pt will demonstrate appropriate performance of final prescribed HEP in order to facilitate improved self-management of symptoms post-discharge.   Baseline: initial HEP prescribed  Goal status: INITIAL    6. Pt will report at least 50% decrease in overall pain levels in past week in order to facilitate improved tolerance to basic ADLs/mobility.   Baseline: 2-/10/10  Goal status: INITIAL     PLAN:  PT FREQUENCY: 1x/week  PT DURATION: 8 weeks  PLANNED INTERVENTIONS: 97164- PT Re-evaluation, 97110-Therapeutic exercises, 97530- Therapeutic activity, 97112- Neuromuscular re-education, 97535- Self Care, 25956- Manual therapy, [phone]- Gait training, Patient/Family education, Balance training, Stair training, Taping, Dry Needling, Joint mobilization, Spinal mobilization, Cryotherapy, and Moist heat  PLAN FOR NEXT SESSION: Review/update HEP PRN. Work on [employer] exercises as appropriate with emphasis on postural endurance, cervical mobility,  GH/periscapular strength. Symptom modification strategies as indicated/appropriate.    Ashley Murrain PT, DPT 05/30/2023 11:17 AM    For all possible CPT codes, reference the Planned Interventions line above.     Check all conditions that are expected to impact treatment: {Conditions expected to impact treatment:Musculoskeletal disorders   If treatment provided at initial evaluation, no treatment charged due to lack of authorization.

## 2023-05-27 NOTE — Progress Notes (Signed)
Name: Christine Oneal  MRN/ DOB: [id number], Nov 26, 1970    Age/ Sex: 52 y.o., female    PCP: Myrlene Broker, MD   Reason for Endocrinology Evaluation: Hypercalcemia      Date of Initial Endocrinology Evaluation: 08/24/2021     HPI: [name] is a 52 y.o. female with a past medical history of A. Fib, CHF, OSA. The patient presented for initial endocrinology clinic visit on 2/13/20223 for consultative assistance with her hypercalcemia .   [name] indicates that she was first diagnosed with hypercalcemia in 2021, with a serum a max calcium of 13.9 mg/dL ( uncorrected) in 73/7106 with elevated PTH 91 pg/mL .   Of note, the pt was hospitalized 05/2021 and pamidronate was given     She denies  history of kidney stones, kidney disease, liver disease, granulomatous disease. She denies  osteoporosis or prior fractures. Daily dietary calcium intake: 2 servings . She denies  family history of osteoporosis, parathyroid disease   24-hour urinary calcium 419 mg in November 2022  Sister with  thyroid disease.   DXA low bone density with a T score -1.3 at the [facility] 08/31/2021  S/p 2 parathyroidectomy 01/2023 SUBJECTIVE:     Today (05/27/23): [name] is here for follow-up on hyperparathyroidism  She is s/p parathyroidectomy 01/17/2023 Denies hoarseness  Denies palpitations  Has occasional constipation  Denies Polyuria and polydipsia  No muscle cramps or spasms  No tingling or numbness    Vitamin D3 2000 iu daily     HISTORY:  Past Medical History:  Past Medical History:  Diagnosis Date   Abnormal Pap smear of cervix    Allergy    Anemia    Arthritis    Atrial fibrillation (HCC)    CHF (congestive heart failure) (HCC)    Dysrhythmia    A fib   Family history of adverse reaction to anesthesia    Fibroid    History of atrial fibrillation    History of kidney stones    Hyperparathyroidism (HCC)    Hypertension    Kidney stones    Leukocytosis,  unspecified 10/18/2013   Obesity    Plantar fasciitis    Pneumonia    Past Surgical History:  Past Surgical History:  Procedure Laterality Date   ATRIAL FIBRILLATION ABLATION N/A 08/03/2022   Procedure: ATRIAL FIBRILLATION ABLATION;  Surgeon: Lanier Prude, MD;  Location: [facility];  Service: Cardiovascular;  Laterality: N/A;   CARDIOVERSION N/A 05/12/2021   Procedure: CARDIOVERSION;  Surgeon: Dolores Patty, MD;  Location: [facility] ENDOSCOPY;  Service: Cardiovascular;  Laterality: N/A;   CARDIOVERSION N/A 05/29/2021   Procedure: CARDIOVERSION;  Surgeon: Dolores Patty, MD;  Location: [facility] ENDOSCOPY;  Service: Cardiovascular;  Laterality: N/A;   COLPOSCOPY     PARATHYROIDECTOMY N/A 01/17/2023   Procedure: NECK EXPLORATION WITH PARATHYROIDECTOMY;  Surgeon: Darnell Level, MD;  Location: [facility] ORS;  Service: General;  Laterality: N/A;   RIGHT/LEFT HEART CATH AND CORONARY ANGIOGRAPHY N/A 05/11/2021   Procedure: RIGHT/LEFT HEART CATH AND CORONARY ANGIOGRAPHY;  Surgeon: Dolores Patty, MD;  Location: [facility];  Service: Cardiovascular;  Laterality: N/A;   TEE WITHOUT CARDIOVERSION N/A 05/12/2021   Procedure: TRANSESOPHAGEAL ECHOCARDIOGRAM (TEE);  Surgeon: Dolores Patty, MD;  Location: [facility] ENDOSCOPY;  Service: Cardiovascular;  Laterality: N/A;   TUBAL LIGATION  2001    Social History:  reports that she has never smoked. She has never used smokeless tobacco. She reports  that she does not drink alcohol and does not use drugs. Family History: family history includes Cervical cancer in her mother; Diabetes in her sister; Hypertension in her brother, brother, father, and mother; Stroke in her father; Uterine cancer in her maternal grandmother.   HOME MEDICATIONS: Allergies as of 05/27/2023       Reactions   Lisinopril-hydrochlorothiazide Anaphylaxis   Angioedema        Medication List        Accurate as of May 27, 2023  9:26 AM. If you have any questions,  ask your nurse or doctor.          acetaminophen 500 MG tablet Commonly known as: TYLENOL Take 1,000 mg by mouth every 6 (six) hours as needed for moderate pain or headache.   AMBULATORY NON FORMULARY MEDICATION Medication Name: Diltiazem 2%/Lidocaine 2%   Using your index finger apply a small amount of medication inside the anal opening and to the external anal area tthree times daily x 6 weeks.   apixaban 5 MG Tabs tablet Commonly known as: Eliquis Take 1 tablet (5 mg total) by mouth 2 (two) times daily.   azelastine 0.1 % nasal spray Commonly known as: ASTELIN Place 1 spray into both nostrils 2 (two) times daily. Use in each nostril as directed What changed:  when to take this reasons to take this   carvedilol 6.25 MG tablet Commonly known as: COREG Take 1 tablet (6.25 mg total) by mouth 2 (two) times daily.   cyclobenzaprine 10 MG tablet Commonly known as: FLEXERIL Take 0.5-1 tablets (5-10 mg total) by mouth 3 (three) times daily as needed for muscle spasms.   dapagliflozin propanediol 10 MG Tabs tablet Commonly known as: Farxiga Take 1 tablet (10 mg total) by mouth daily.   dextromethorphan-guaiFENesin 30-600 MG 12hr tablet Commonly known as: MUCINEX DM Take 1 tablet by mouth 2 (two) times daily as needed (congestion).   gabapentin 100 MG capsule Commonly known as: NEURONTIN Take 1 capsule (100 mg total) by mouth at bedtime. 100-300mg  daily at bedtime What changed:  when to take this reasons to take this additional instructions   hydrALAZINE 50 MG tablet Commonly known as: APRESOLINE Take 1 tablet (50 mg total) by mouth 3 (three) times daily.   hydrocortisone 2.5 % rectal cream Commonly known as: Anusol-HC Place 1 Application rectally 2 (two) times daily.   isosorbide mononitrate 30 MG 24 hr tablet Commonly known as: IMDUR Take 1 tablet (30 mg total) by mouth daily.   Klor-Con M20 20 MEQ tablet Generic drug: potassium chloride SA TAKE 1 TABLET BY  MOUTH AS NEEDED( TAKE WHEN TAKING TORSEMIDE)   lidocaine 5 % Commonly known as: Lidoderm Place 1 patch onto the skin daily. Remove & Discard patch within 12 hours or as directed by MD   losartan 50 MG tablet Commonly known as: COZAAR Take 1 tablet (50 mg total) by mouth in the morning and at bedtime.   mirabegron ER 50 MG Tb24 tablet Commonly known as: Myrbetriq Take 1 tablet (50 mg total) by mouth daily.   nystatin powder Commonly known as: MYCOSTATIN/NYSTOP Apply 1 Application topically 3 (three) times daily.   polyethylene glycol powder 17 GM/SCOOP powder Commonly known as: GLYCOLAX/MIRALAX Take 17 g by mouth 2 (two) times daily as needed.   Probiotic Chew Chew 2 capsules by mouth daily.   spironolactone 25 MG tablet Commonly known as: ALDACTONE Take 1 tablet (25 mg total) by mouth daily.   torsemide 20 MG tablet Commonly  known as: DEMADEX TAKE 1 TABLET BY MOUTH AS NEEDED(WHEN WEIGHT IS UP OR NOTES SWELLING)   triamcinolone cream 0.1 % Commonly known as: KENALOG Apply 1 Application topically 2 (two) times daily. What changed:  when to take this reasons to take this   Vitamin D3 25 MCG (1000 UT) Caps Take 2,000 Units by mouth daily in the afternoon.          REVIEW OF SYSTEMS: A comprehensive ROS was conducted with the patient and is negative except as per HPI     OBJECTIVE:  VS: BP 110/70 (BP Location: Left Arm, Patient Position: Sitting, Cuff Size: Large)   Pulse 89   Ht 5\' 3"  (1.6 m)   Wt 231 lb (104.8 kg)   SpO2 99%   BMI 40.92 kg/m    Wt Readings from Last 3 Encounters:  05/27/23 231 lb (104.8 kg)  05/18/23 233 lb (105.7 kg)  05/05/23 231 lb (104.8 kg)     EXAM: General: Pt appears well and is in NAD  Neck: General: Supple without adenopathy. Thyroid: Thyroid size normal.  No goiter or nodules appreciated.  Lungs: Clear with good BS bilat with no rales, rhonchi, or wheezes  Heart: Auscultation: RRR.  Extremities:  BL LE: No pretibial  edema   Mental Status: Judgment, insight: Intact Orientation: Oriented to time, place, and person Mood and affect: No depression, anxiety, or agitation     DATA REVIEWED:   Latest Reference Range & Units 05/27/23 09:41  Calcium 8.4 - 10.5 mg/dL 9.3  Albumin 3.5 - 5.2 g/dL 3.9    Latest Reference Range & Units 05/27/23 09:41  VITD 30.00 - 100.00 ng/mL 45.31     Latest Reference Range & Units 02/18/23 10:54  Sodium 135 - 145 mEq/L 133 (L)  Potassium 3.5 - 5.1 mEq/L 4.0  Chloride 96 - 112 mEq/L 101  CO2 19 - 32 mEq/L 26  Glucose 70 - 99 mg/dL 84  BUN 6 - 23 mg/dL 31 (H)  Creatinine 7.82 - 1.20 mg/dL 9.56 (H)  Calcium 8.4 - 10.5 mg/dL 9.4  Alkaline Phosphatase 39 - 117 U/L 92  Albumin 3.5 - 5.2 g/dL 3.9  AST 0 - 37 U/L 16  ALT 0 - 35 U/L 16  Total Protein 6.0 - 8.3 g/dL 7.3  Total Bilirubin 0.2 - 1.2 mg/dL 0.8  GFR >21.30 mL/min 43.30 (L)     Old records , labs and images have been reviewed.   ASSESSMENT/PLAN/RECOMMENDATIONS:   Primary hyperparathyroidism:  -S/p parathyroidectomy 01/2023 -Postop calcium normalized -Emphasized the importance of vitamin D and calcium intake    2.  Vitamin D deficiency:  -Resolved -Continue vitamin D  2000 IU daily    Follow-up as needed  Signed electronically by: Lyndle Herrlich, MD  [facility] Endocrinology  [facility] [address] [place], [place] [address] Phone: [phone] FAX: [phone]   CC: Myrlene Broker, MD [address] [place] [address] Phone: [phone] Fax: [phone]   Return to Endocrinology clinic as below: Future Appointments  Date Time Provider Department Center  05/27/2023  9:30 AM Lynkin Saini, Konrad Dolores, MD [facility] None  05/30/2023  8:00 AM Ashley Murrain, PT [facility]  05/30/2023 11:40 AM Graciella Freer, PA-C [facility]  06/22/2023 10:00 AM Rodolph Bong, MD [facility] None

## 2023-05-29 NOTE — Progress Notes (Unsigned)
  Electrophysiology Office Note:   Date:  05/30/2023  ID:  Christine Oneal, DOB 11/05/1970, MRN [id number]  Primary Cardiologist: None Electrophysiologist: Lanier Prude, MD      History of Present Illness:   Christine Oneal is a 52 y.o. female with h/o persistent AF s/p ablation, HTN, and obesity seen today for routine electrophysiology followup.   Since last being seen in our clinic the patient reports doing well overall. Has been told she snores. Asks about clarifications for restrictions and her ability to return to work if desired. Currently,  she denies chest pain, palpitations, dyspnea, PND, orthopnea, nausea, vomiting, dizziness, syncope, edema, weight gain, or early satiety.   Review of systems complete and found to be negative unless listed in HPI.   EP Information / Studies Reviewed:    EKG is ordered today. Personal review as below.       Arrhythmia History  AF ablation 08/03/2022  Echo 07/26/2022 LVEF 55-60, mild LVH     Physical Exam:   VS:  Pulse 69   Ht 5\' 3"  (1.6 m)   Wt 235 lb (106.6 kg)   BMI 41.63 kg/m    Wt Readings from Last 3 Encounters:  05/30/23 235 lb (106.6 kg)  05/27/23 231 lb (104.8 kg)  05/18/23 233 lb (105.7 kg)     GEN: Well nourished, well developed in no acute distress NECK: No JVD; No carotid bruits CARDIAC: Regular rate and rhythm, no murmurs, rubs, gallops RESPIRATORY:  Clear to auscultation without rales, wheezing or rhonchi  ABDOMEN: Soft, non-tender, non-distended EXTREMITIES:  No edema; No deformity   ASSESSMENT AND PLAN:    Persistent AF and AFL EKG today shows NSR S/p Ablation 08/03/2022 Amiodarone stopped at last visit without recurrence Continue eliquis 5 mg BID for CHA2DS2/VASc of at least 3.    HTN Stable on current regimen   Obesity Body mass index is 41.63 kg/m.  Encouraged lifestyle modification   Sleep disordered breathing At least 4 risk factors for OSA Will arrange home sleep study.   Follow up  with EP APP in 6 months, sooner with issues.   Signed, Graciella Freer, PA-C

## 2023-05-30 ENCOUNTER — Encounter: Payer: Self-pay | Admitting: Physical Therapy

## 2023-05-30 ENCOUNTER — Encounter: Payer: Self-pay | Admitting: Student

## 2023-05-30 ENCOUNTER — Telehealth: Payer: Self-pay | Admitting: *Deleted

## 2023-05-30 ENCOUNTER — Ambulatory Visit: Payer: Medicaid Other | Attending: Student | Admitting: Student

## 2023-05-30 ENCOUNTER — Ambulatory Visit: Payer: Medicaid Other | Attending: Family Medicine | Admitting: Physical Therapy

## 2023-05-30 VITALS — BP 108/70 | HR 69 | Ht 63.0 in | Wt 235.0 lb

## 2023-05-30 DIAGNOSIS — G8929 Other chronic pain: Secondary | ICD-10-CM | POA: Diagnosis not present

## 2023-05-30 DIAGNOSIS — I4819 Other persistent atrial fibrillation: Secondary | ICD-10-CM

## 2023-05-30 DIAGNOSIS — M542 Cervicalgia: Secondary | ICD-10-CM | POA: Diagnosis not present

## 2023-05-30 DIAGNOSIS — M546 Pain in thoracic spine: Secondary | ICD-10-CM | POA: Diagnosis not present

## 2023-05-30 DIAGNOSIS — M6281 Muscle weakness (generalized): Secondary | ICD-10-CM | POA: Diagnosis not present

## 2023-05-30 DIAGNOSIS — I10 Essential (primary) hypertension: Secondary | ICD-10-CM | POA: Diagnosis not present

## 2023-05-30 DIAGNOSIS — G4733 Obstructive sleep apnea (adult) (pediatric): Secondary | ICD-10-CM

## 2023-05-30 NOTE — Patient Instructions (Signed)
Medication Instructions:  Your physician recommends that you continue on your current medications as directed. Please refer to the Current Medication list given to you today.  *If you need a refill on your cardiac medications before your next appointment, please call your pharmacy*  Lab Work: None ordered If you have labs (blood work) drawn today and your tests are completely normal, you will receive your results only by: MyChart Message (if you have MyChart) OR A paper copy in the mail If you have any lab test that is abnormal or we need to change your treatment, we will call you to review the results.  Testing/Procedures: Your physician has recommended that you have a sleep study. This test records several body functions during sleep, including: brain activity, eye movement, oxygen and carbon dioxide blood levels, heart rate and rhythm, breathing rate and rhythm, the flow of air through your mouth and nose, snoring, body muscle movements, and chest and belly movement.  Follow-Up: At [facility], you and your health needs are our priority.  As part of our continuing mission to provide you with exceptional heart care, we have created designated Provider Care Teams.  These Care Teams include your primary Cardiologist (physician) and Advanced Practice Providers (APPs -  Physician Assistants and Nurse Practitioners) who all work together to provide you with the care you need, when you need it.  Your next appointment:   6 month(s)  Provider:   Casimiro Needle "Otilio Saber, PA-C

## 2023-05-30 NOTE — Progress Notes (Signed)
Patient Name: Christine Oneal  DOB: 07-23-1970 Height: 5'3" Weight:  Last Weight  Most recent update: 05/30/2023 12:15 PM    Weight  106.6 kg (235 lb)              Today's Date: 05/30/23   STOP BANG RISK ASSESSMENT  S(snore) Have you been told that your snore?  T(tired) Are you often tired, fatigued, or sleepy during the day? O(obstruction) Do you stop breathing, choke or gasp during sleep? P(pressure) Do you have or are you being treated for high blood pressure? B(BMI) Is your body index greater than 35 kg/m? Body mass index is 41.63 kg/m.  A(age) Are you 53 years old or older? 52 y.o. N(neck) Do you have a neck circumference greater than 40 cm? G(gender) Are you a female? female  Clinical Notes: Will consult Sleep Specialist and refer for management of therapy due to patient increased risk of Sleep Apnea. Ordering a sleep study due to the following clinical symptoms: Loud snoring R06.83, Unrefreshed by sleep G47.8,  History of high blood pressure R03.0

## 2023-05-30 NOTE — Telephone Encounter (Signed)
Christine Oneal, PAC ORDERED ITAMAR STUDY.   Patient agreement reviewed and signed on 05/30/2023.  WatchPAT issued to patient on 05/30/2023 by Danielle Rankin, CMA. Patient aware to not open the WatchPAT box until contacted with the activation PIN. Patient profile initialized in CloudPAT on 05/30/2023 by Danielle Rankin, CMA. Device serial number: [id number]  Please list Reason for Call as Advice Only and type "WatchPAT issued to patient" in the comment box.

## 2023-06-15 ENCOUNTER — Ambulatory Visit: Payer: Medicaid Other | Admitting: Physical Therapy

## 2023-06-17 ENCOUNTER — Ambulatory Visit: Payer: Medicaid Other | Attending: Family Medicine | Admitting: Physical Therapy

## 2023-06-17 ENCOUNTER — Encounter: Payer: Self-pay | Admitting: Physical Therapy

## 2023-06-17 DIAGNOSIS — M546 Pain in thoracic spine: Secondary | ICD-10-CM | POA: Diagnosis not present

## 2023-06-17 DIAGNOSIS — M6281 Muscle weakness (generalized): Secondary | ICD-10-CM | POA: Diagnosis not present

## 2023-06-17 DIAGNOSIS — M542 Cervicalgia: Secondary | ICD-10-CM | POA: Diagnosis not present

## 2023-06-17 NOTE — Therapy (Signed)
OUTPATIENT PHYSICAL THERAPY CERVICAL TREATMENT    Patient Name: Christine Oneal MRN: [id number] DOB:[birth year], 52 y.o., female Today's Date: 06/17/2023  END OF SESSION:  PT End of Session - 06/17/23 1233     Visit Number 2    Number of Visits 9    Date for PT Re-Evaluation 07/25/23    Authorization Type [employer]    Authorization Time Period 06/13/23-08/10/22    Authorization - Visit Number 1    Authorization - Number of Visits 7    PT Start Time 1230    PT Stop Time 1318    PT Time Calculation (min) 48 min             Past Medical History:  Diagnosis Date   Abnormal Pap smear of cervix    Allergy    Anemia    Arthritis    Atrial fibrillation (HCC)    CHF (congestive heart failure) (HCC)    Dysrhythmia    A fib   Family history of adverse reaction to anesthesia    Fibroid    History of atrial fibrillation    History of kidney stones    Hyperparathyroidism (HCC)    Hypertension    Kidney stones    Leukocytosis, unspecified 10/18/2013   Obesity    Plantar fasciitis    Pneumonia    Past Surgical History:  Procedure Laterality Date   ATRIAL FIBRILLATION ABLATION N/A 08/03/2022   Procedure: ATRIAL FIBRILLATION ABLATION;  Surgeon: Lanier Prude, MD;  Location: [facility];  Service: Cardiovascular;  Laterality: N/A;   CARDIOVERSION N/A 05/12/2021   Procedure: CARDIOVERSION;  Surgeon: Dolores Patty, MD;  Location: [facility] ENDOSCOPY;  Service: Cardiovascular;  Laterality: N/A;   CARDIOVERSION N/A 05/29/2021   Procedure: CARDIOVERSION;  Surgeon: Dolores Patty, MD;  Location: [facility] ENDOSCOPY;  Service: Cardiovascular;  Laterality: N/A;   COLPOSCOPY     PARATHYROIDECTOMY N/A 01/17/2023   Procedure: NECK EXPLORATION WITH PARATHYROIDECTOMY;  Surgeon: Darnell Level, MD;  Location: [facility] ORS;  Service: General;  Laterality: N/A;   RIGHT/LEFT HEART CATH AND CORONARY ANGIOGRAPHY N/A 05/11/2021   Procedure: RIGHT/LEFT HEART CATH AND CORONARY ANGIOGRAPHY;   Surgeon: Dolores Patty, MD;  Location: [facility] IN[facility] CV [facility];  Service: Cardiovascular;  Laterality: N/A;   TEE WITHOUT CARDIOVERSION N/A 05/12/2021   Procedure: TRANSESOPHAGEAL ECHOCARDIOGRAM (TEE);  Surgeon: Dolores Patty, MD;  Location: [facility] ENDOSCOPY;  Service: Cardiovascular;  Laterality: N/A;   TUBAL LIGATION  2001   Patient Active Problem List   Diagnosis Date Noted   B12 deficiency 03/02/2023   Stage 3b chronic kidney disease (CKD) (HCC) 03/02/2023   Constipation 03/02/2023   Right foot pain 12/09/2022   Urinary incontinence 08/23/2022   Chronic pain of both knees 08/23/2022   Gait instability 08/23/2022   Dysmenorrhea 05/14/2022   Left foot pain 05/14/2022   Encounter for general adult medical examination with abnormal findings 04/17/2022   Primary hyperparathyroidism (HCC) 11/25/2021   Chronic systolic CHF (congestive heart failure) (HCC) 05/26/2021   Unspecified atrial fibrillation (HCC) 05/04/2021   Anemia 09/04/2017   Morbid obesity (HCC) 09/03/2017   Reactive airway disease 09/03/2017   Vitamin D deficiency 09/03/2017   Essential hypertension 10/24/2007   Hemorrhoids 10/24/2007    PCP: Myrlene Broker, MD  REFERRING PROVIDER: Rodolph Bong, MD  REFERRING DIAG: [phone] (ICD-10-CM) - Chronic neck pain M54.6 (ICD-10-CM) - Pain in thoracic spine  THERAPY DIAG:  Cervicalgia  Pain in thoracic spine  Muscle weakness (  generalized)  Rationale for Evaluation and Treatment: Rehabilitation  ONSET DATE: September 2024  SUBJECTIVE:                                                                                                                                                                                                         SUBJECTIVE STATEMENT: Pt reports compliance with HEP and low level pain today. Pain was elevated yesterday.    EVAL: Pain seems to be slowly worsening over the past month or so. Pt cannot recall any precipitating change in  activity or injury, but notes she has been busy with school and housework. No UE referral, occasionally gets headaches but not always. No N/T.  Notes her knee and her hip still bother her some with stairs and floor transfers, has been doing HEP since recent discharge.  Hand dominance: Right  PERTINENT HISTORY:  HTN, CHF, afib  PAIN:  Are you having pain: mild, 2/10 Location/description: neck and upper back, central and shoulder blades ; aching Best-worst over past week: 2-10/10  - aggravating factors: prolonged activity, standing, housework, walking >72min  - Easing factors: support (pillow), medication, lying down    PRECAUTIONS: cardiac hx   WEIGHT BEARING RESTRICTIONS: No  FALLS:  Has patient fallen in last 6 months? No  LIVING ENVIRONMENT: Lives with son in apartment w/ 2STE, no steps inside  OCCUPATION: doing online classes  PLOF: Independent with some limitations due to cardiac issues and pain  PATIENT GOALS: relieve back pain  NEXT MD VISIT: December 12th  OBJECTIVE:  Note: Objective measures were completed at Evaluation unless otherwise noted.  DIAGNOSTIC FINDINGS:  05/18/23 Thoracic/Cervical XR, refer to EPIC as needed, show degenerative changes per review of impression  PATIENT SURVEYS:  FOTO 63>69  COGNITION: Overall cognitive status: Within functional limits for tasks assessed  SENSATION: Denies sensory complaints  POSTURE: forward head, R UT elevated > L  PALPATION: Concordant tenderness BIL (L>R) upper trap, levator scap, rhomboids, cervical paraspinals; L deltoid tenderness  CERVICAL ROM:   Active ROM A/PROM (deg) eval  Flexion 100%  Extension <25% *  Right lateral flexion 50%  Left lateral flexion 50% *  Right rotation 46 deg *  Left rotation 46 deg *   (Blank rows = not tested) (Key: WFL = within functional limits not formally assessed, * = concordant pain, s = stiffness/stretching sensation, NT = not tested)   UPPER EXTREMITY  ROM:  A/PROM Right eval Left eval  Shoulder flexion WFL painless WFL painless with L shoulder discomfort  Shoulder abduction    Shoulder internal  rotation    Shoulder external rotation    Elbow flexion    Elbow extension    Wrist flexion    Wrist extension     (Blank rows = not tested) (Key: WFL = within functional limits not formally assessed, * = concordant pain, s = stiffness/stretching sensation, NT = not tested)  Comments:    UPPER EXTREMITY MMT:  MMT Right eval Left eval  Shoulder flexion 5 5  Shoulder extension    Shoulder abduction 5 4+ *  Shoulder extension    Shoulder internal rotation 5 5  Shoulder external rotation 5 4+ *  Elbow flexion    Elbow extension    Grip strength 40#  40#  (Blank rows = not tested)  (Key: WFL = within functional limits not formally assessed, * = concordant pain, s = stiffness/stretching sensation, NT = not tested)  Comments:   CERVICAL SPECIAL TESTS:  deferred  FUNCTIONAL TESTS:  See grip strength above  TODAY'S TREATMENT:    [facility] Adult PT Treatment:                                                DATE: 06/17/23 Therapeutic Exercise: Seated UT stretch x 2 each Seated Levator stretch x 2 each  Seated Scap retraction -cues to avoid shoulder hike Seated chin tuck Red band ER bilat 10 x 2  Thoracic ext in using chair back, 2 levels , 10 each , hands behind head Open books x 4 each at wall Pec stretch in corner x3 Green band Rows 10 x 3  (1 set in doorway for HEP) Updated HEP  Modalities: HMP cervical and upper back x 10 minutes  Self Care: Posture                                                                                                                               [facility] Adult PT Treatment:                                                DATE: 05/30/23 Therapeutic Exercise: Scapular retraction x10, cues for reduced UT compensations UT stretch x30sec BIL cues for form and comfortable ROM HEP handout +  education   PATIENT EDUCATION:  Education details: Pt education on PT impairments, prognosis, and POC. Informed consent. Rationale for interventions, safe/appropriate HEP performance Person educated: Patient Education method: Explanation, Demonstration, Tactile cues, Verbal cues Education comprehension: verbalized understanding, returned demonstration, verbal cues required, tactile cues required, and needs further education    HOME EXERCISE PROGRAM: Access Code: [email or web address] URL: [email or web address] Date: 05/30/2023 Prepared by: Fransisco Hertz  Exercises - Seated Scapular Retraction  - 2-3 x daily - 1 sets -  10 reps - Seated Gentle Upper Trapezius Stretch  - 2-3 x daily - 1 sets - 1-2 reps - 30sec hold Added 06/17/23 - Gentle Levator Scapulae Stretch  - 1 x daily - 7 x weekly - 3 sets - 10 reps - Standing Shoulder Row with Anchored Resistance  - 1 x daily - 7 x weekly - 2 sets - 10 reps - Seated Cervical Retraction  - 1 x daily - 7 x weekly - 1 sets - 10 reps - 5 hold - Supine Cervical Retraction with Towel  - 1 x daily - 7 x weekly - 1-2 sets - 10 reps - 5 hold  ASSESSMENT:  CLINICAL IMPRESSION: Pt reports continued pain. Today her pain is mild however yesterday was 10/10. Discussed self care strategies including heat modalities that she can perform at home. Trial of HMP at end of session for education and to decrease pain. Reviewed HEP with min cues to avoid shoulder hike during scap retractions. Progressed neck stabilization and thoracic mobility. Progressed Scapular retraction to resisted and updated HEP. She had some mild increased pain with thoracic rotations. At end of session she reported no increased pain.    EVAL: Patient is a pleasant 52 y.o. woman who was seen today for physical therapy evaluation and treatment for neck/thoracic pain ongoing since around September. Endorses increased pain/limitation with typical household tasks and prolonged activity. On exam she  demonstrates concordant limitations in cervical mobility and L GH strength, postural deficits as above, and concordant TTP to cervical/periscapular musculature. Tolerates HEP and exam well without adverse event. Recommend trial of skilled PT to address aforementioned deficits with aim of improving functional tolerance and reducing pain with typical activities. Pt departs today's session in no acute distress, all voiced concerns/questions addressed appropriately from PT perspective.      OBJECTIVE IMPAIRMENTS: decreased activity tolerance, decreased endurance, decreased mobility, difficulty walking, decreased ROM, decreased strength, impaired UE functional use, postural dysfunction, and pain.   ACTIVITY LIMITATIONS: carrying, lifting, standing, and locomotion level  PARTICIPATION LIMITATIONS: meal prep, cleaning, laundry, and community activity  PERSONAL FACTORS: Time since onset of injury/illness/exacerbation and 3+ comorbidities: Afib, HTN, CHF  are also affecting patient's functional outcome.   REHAB POTENTIAL: Good  CLINICAL DECISION MAKING: Stable/uncomplicated  EVALUATION COMPLEXITY: Low   GOALS: Goals reviewed with patient? Yes  SHORT TERM GOALS: Target date: 06/27/2023 Pt will demonstrate appropriate understanding and performance of initially prescribed HEP in order to facilitate improved independence with management of symptoms.  Baseline: HEP provided on eval Goal status: INITIAL   2. Pt will score greater than or equal to 66 on FOTO in order to demonstrate improved perception of function due to symptoms.  Baseline: 63  Goal status: INITIAL    LONG TERM GOALS: Target date: 07/25/2023 Pt will score 69 on FOTO in order to demonstrate improved perception of function due to symptoms. Baseline: 63 Goal status: INITIAL  2. Pt will demonstrate at least 50 degrees of active cervical rotation ROM in order to demonstrate improved environmental awareness and safety with driving.   Baseline: see ROM chart above Goal status: INITIAL  3. Pt will demonstrate at least 4+/5 shoulder abduction/ER MMT bilaterally for improved symmetry of UE strength and improved tolerance to functional movements.  Baseline: see MMT chart above Goal status: INITIAL   4. Pt will report/demonstrate ability to perform typical housework for at least 30 min with less than 2 point increase in neck/back pain on NPS in order to demonstrate improved tolerance to  household tasks. Baseline: up to 10/10 Goal status: INITIAL   5. Pt will demonstrate appropriate performance of final prescribed HEP in order to facilitate improved self-management of symptoms post-discharge.   Baseline: initial HEP prescribed  Goal status: INITIAL    6. Pt will report at least 50% decrease in overall pain levels in past week in order to facilitate improved tolerance to basic ADLs/mobility.   Baseline: 2-/10/10  Goal status: INITIAL     PLAN:  PT FREQUENCY: 1x/week  PT DURATION: 8 weeks  PLANNED INTERVENTIONS: 97164- PT Re-evaluation, 97110-Therapeutic exercises, 97530- Therapeutic activity, 97112- Neuromuscular re-education, 97535- Self Care, 40347- Manual therapy, [phone]- Gait training, Patient/Family education, Balance training, Stair training, Taping, Dry Needling, Joint mobilization, Spinal mobilization, Cryotherapy, and Moist heat  PLAN FOR NEXT SESSION: Review/update HEP PRN. Work on [employer] exercises as appropriate with emphasis on postural endurance, cervical mobility, GH/periscapular strength. Symptom modification strategies as indicated/appropriate.    Jannette Spanner, PTA 06/17/23 1:36 PM Phone: [phone] Fax: [phone]    For all possible CPT codes, reference the Planned Interventions line above.     Check all conditions that are expected to impact treatment: {Conditions expected to impact treatment:Musculoskeletal disorders   If treatment provided at initial evaluation, no treatment  charged due to lack of authorization.

## 2023-06-21 ENCOUNTER — Other Ambulatory Visit (HOSPITAL_COMMUNITY): Payer: Self-pay | Admitting: Internal Medicine

## 2023-06-21 DIAGNOSIS — I5022 Chronic systolic (congestive) heart failure: Secondary | ICD-10-CM

## 2023-06-22 ENCOUNTER — Ambulatory Visit: Payer: Medicaid Other | Admitting: Family Medicine

## 2023-06-22 ENCOUNTER — Other Ambulatory Visit: Payer: Self-pay | Admitting: Internal Medicine

## 2023-06-22 VITALS — BP 108/78 | HR 77 | Ht 63.0 in | Wt 238.0 lb

## 2023-06-22 DIAGNOSIS — G8929 Other chronic pain: Secondary | ICD-10-CM

## 2023-06-22 DIAGNOSIS — M542 Cervicalgia: Secondary | ICD-10-CM | POA: Diagnosis not present

## 2023-06-22 DIAGNOSIS — M546 Pain in thoracic spine: Secondary | ICD-10-CM | POA: Diagnosis not present

## 2023-06-22 DIAGNOSIS — I4819 Other persistent atrial fibrillation: Secondary | ICD-10-CM

## 2023-06-22 DIAGNOSIS — I10 Essential (primary) hypertension: Secondary | ICD-10-CM | POA: Diagnosis not present

## 2023-06-22 NOTE — Progress Notes (Signed)
   [name], PhD, LAT, ATC acting as a scribe for [name], MD.  MICHELLIE Oneal is a 52 y.o. female who presents to [facility] today for f/u neck and upper back pain. Pt was last seen by Dr. Denyse Amass on 05/18/23 and was advised to use a heating pad, TENS unit, and was referred to PT completing 2 visits. Also taught HEP and prescribed cyclobenzaprine.  Today, pt reports neck and upper back pain continues. Pain varies. Pt locates pain to both sides of her neck, periscapular, and upper back area. No radiating pain. No numbness/tingling.   Dx imaging: 05/18/23 T-spine & C-spine XR  Pertinent review of systems: No fevers or chills  Relevant historical information: Atrial fibrillation CKD.   Exam:  BP 108/78   Pulse 77   Ht 5\' 3"  (1.6 m)   Wt 238 lb (108 kg)   SpO2 99%   BMI 42.16 kg/m  General: Well Developed, well nourished, and in no acute distress.   MSK: C-spine and T-spine nontender midline.  Tender palpation paraspinal musculature.  Upper extremity strength is intact.    Lab and Radiology Results  DG Cervical Spine 2 or 3 views  Result Date: 05/18/2023 CLINICAL DATA:  Neck pain for 2 months. EXAM: CERVICAL SPINE - 2-3 VIEW COMPARISON:  None Available. FINDINGS: There is no evidence of cervical spine fracture or prevertebral soft tissue swelling. Straightening of cervical spine. Mild degenerative joint changes of the mid to lower cervical spine with anterior spurring and narrow intervertebral spaces. No other significant bone abnormalities are identified. 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 Electronically Signed   By: Sherian Rein M.D.   On: 05/18/2023 09:41   DG Thoracic Spine 2 View  Result Date: 05/18/2023 CLINICAL DATA:  Upper back pain. EXAM: THORACIC SPINE 2 VIEWS COMPARISON:  None Available. FINDINGS: There is no evidence of thoracic spine fracture. Scoliosis. Degenerative joint changes  of the spine with narrowed joint space and anterior spurring. IMPRESSION: Degenerative joint changes of the spine. Electronically Signed   By: Sherian Rein M.D.   On: 05/18/2023 09:40   I, [name], personally (independently) visualized and performed the interpretation of the images attached in this note.      Assessment and Plan: 52 y.o. female with chronic neck and upper back pain due to primarily to muscle dysfunction and spasm.  She does have degenerative changes seen on x-ray which are contributory.  Was a chronic ongoing issue.  Plan to continue physical therapy and home exercise program for 1 more month.  Continue cyclobenzaprine.  We talked about and recommended a specific type of heating pad and TENS unit which should be helpful.  If not improving in 1 month consider MRI.  Recheck 1 month Avoid high-dose oral NSAIDs given atrial fibrillation and hypertension.  PDMP not reviewed this encounter. No orders of the defined types were placed in this encounter.  No orders of the defined types were placed in this encounter.    Discussed warning signs or symptoms. Please see discharge instructions. Patient expresses understanding.   The above documentation has been reviewed and is accurate and complete [name], M.D.

## 2023-06-22 NOTE — Therapy (Signed)
OUTPATIENT PHYSICAL THERAPY CERVICAL TREATMENT    Patient Name: Christine Oneal MRN: [id number] DOB:08/23/1970, 52 y.o., female Today's Date: 06/23/2023  END OF SESSION:  PT End of Session - 06/23/23 0825     Visit Number 3    Number of Visits 9    Date for PT Re-Evaluation 07/25/23    Authorization Type [employer]    Authorization Time Period 06/13/23-08/10/22    Authorization - Visit Number 2    Authorization - Number of Visits 7    PT Start Time 9563    PT Stop Time 0913    PT Time Calculation (min) 41 min    Activity Tolerance Patient tolerated treatment well;No increased pain    Behavior During Therapy WFL for tasks assessed/performed              Past Medical History:  Diagnosis Date   Abnormal Pap smear of cervix    Allergy    Anemia    Arthritis    Atrial fibrillation (HCC)    CHF (congestive heart failure) (HCC)    Dysrhythmia    A fib   Family history of adverse reaction to anesthesia    Fibroid    History of atrial fibrillation    History of kidney stones    Hyperparathyroidism (HCC)    Hypertension    Kidney stones    Leukocytosis, unspecified 10/18/2013   Obesity    Plantar fasciitis    Pneumonia    Past Surgical History:  Procedure Laterality Date   ATRIAL FIBRILLATION ABLATION N/A 08/03/2022   Procedure: ATRIAL FIBRILLATION ABLATION;  Surgeon: Lanier Prude, MD;  Location: [facility];  Service: Cardiovascular;  Laterality: N/A;   CARDIOVERSION N/A 05/12/2021   Procedure: CARDIOVERSION;  Surgeon: Dolores Patty, MD;  Location: [facility] ENDOSCOPY;  Service: Cardiovascular;  Laterality: N/A;   CARDIOVERSION N/A 05/29/2021   Procedure: CARDIOVERSION;  Surgeon: Dolores Patty, MD;  Location: [facility] ENDOSCOPY;  Service: Cardiovascular;  Laterality: N/A;   COLPOSCOPY     PARATHYROIDECTOMY N/A 01/17/2023   Procedure: NECK EXPLORATION WITH PARATHYROIDECTOMY;  Surgeon: Darnell Level, MD;  Location: [facility] ORS;  Service: General;  Laterality:  N/A;   RIGHT/LEFT HEART CATH AND CORONARY ANGIOGRAPHY N/A 05/11/2021   Procedure: RIGHT/LEFT HEART CATH AND CORONARY ANGIOGRAPHY;  Surgeon: Dolores Patty, MD;  Location: [facility];  Service: Cardiovascular;  Laterality: N/A;   TEE WITHOUT CARDIOVERSION N/A 05/12/2021   Procedure: TRANSESOPHAGEAL ECHOCARDIOGRAM (TEE);  Surgeon: Dolores Patty, MD;  Location: [facility] ENDOSCOPY;  Service: Cardiovascular;  Laterality: N/A;   TUBAL LIGATION  2001   Patient Active Problem List   Diagnosis Date Noted   B12 deficiency 03/02/2023   Stage 3b chronic kidney disease (CKD) (HCC) 03/02/2023   Constipation 03/02/2023   Right foot pain 12/09/2022   Urinary incontinence 08/23/2022   Chronic pain of both knees 08/23/2022   Gait instability 08/23/2022   Dysmenorrhea 05/14/2022   Left foot pain 05/14/2022   Encounter for general adult medical examination with abnormal findings 04/17/2022   Primary hyperparathyroidism (HCC) 11/25/2021   Chronic systolic CHF (congestive heart failure) (HCC) 05/26/2021   Unspecified atrial fibrillation (HCC) 05/04/2021   Anemia 09/04/2017   Morbid obesity (HCC) 09/03/2017   Reactive airway disease 09/03/2017   Vitamin D deficiency 09/03/2017   Essential hypertension 10/24/2007   Hemorrhoids 10/24/2007    PCP: Myrlene Broker, MD  REFERRING PROVIDER: Rodolph Bong, MD  REFERRING DIAG: [phone] (ICD-10-CM) - Chronic  neck pain M54.6 (ICD-10-CM) - Pain in thoracic spine  THERAPY DIAG:  Cervicalgia  Pain in thoracic spine  Muscle weakness (generalized)  Rationale for Evaluation and Treatment: Rehabilitation  ONSET DATE: September 2024  SUBJECTIVE:                                                                                                                                                                                                         SUBJECTIVE STATEMENT: 06/23/2023 Pt reports some mild neck pain but more so in lower back at  present, rated at 4/10. Symptoms feeling about the same overall, fluctuating from day to day. States she saw doctor yesterday and they gave her an HEP, wants her to follow up again in a month and may get an MRI if still having problems at that point.    EVAL: Pain seems to be slowly worsening over the past month or so. Pt cannot recall any precipitating change in activity or injury, but notes she has been busy with school and housework. No UE referral, occasionally gets headaches but not always. No N/T.  Notes her knee and her hip still bother her some with stairs and floor transfers, has been doing HEP since recent discharge.  Hand dominance: Right  PERTINENT HISTORY:  HTN, CHF, afib  PAIN:  Are you having pain: 4/10 more low back than neck today  Per eval -  Location/description: neck and upper back, central and shoulder blades ; aching Best-worst over past week: 2-10/10  - aggravating factors: prolonged activity, standing, housework, walking >65min  - Easing factors: support (pillow), medication, lying down    PRECAUTIONS: cardiac hx   WEIGHT BEARING RESTRICTIONS: No  FALLS:  Has patient fallen in last 6 months? No  LIVING ENVIRONMENT: Lives with son in apartment w/ 2STE, no steps inside  OCCUPATION: doing online classes  PLOF: Independent with some limitations due to cardiac issues and pain  PATIENT GOALS: relieve back pain  NEXT MD VISIT: January 2025  OBJECTIVE:  Note: Objective measures were completed at Evaluation unless otherwise noted.  DIAGNOSTIC FINDINGS:  05/18/23 Thoracic/Cervical XR, refer to EPIC as needed, show degenerative changes per review of impression  PATIENT SURVEYS:  FOTO 63>69  COGNITION: Overall cognitive status: Within functional limits for tasks assessed  SENSATION: Denies sensory complaints  POSTURE: forward head, R UT elevated > L  PALPATION: Concordant tenderness BIL (L>R) upper trap, levator scap, rhomboids, cervical paraspinals;  L deltoid tenderness  CERVICAL ROM:   Active ROM A/PROM (deg) eval  Flexion 100%  Extension <25% *  Right  lateral flexion 50%  Left lateral flexion 50% *  Right rotation 46 deg *  Left rotation 46 deg *   (Blank rows = not tested) (Key: WFL = within functional limits not formally assessed, * = concordant pain, s = stiffness/stretching sensation, NT = not tested)   UPPER EXTREMITY ROM:  A/PROM Right eval Left eval  Shoulder flexion WFL painless WFL painless with L shoulder discomfort  Shoulder abduction    Shoulder internal rotation    Shoulder external rotation    Elbow flexion    Elbow extension    Wrist flexion    Wrist extension     (Blank rows = not tested) (Key: WFL = within functional limits not formally assessed, * = concordant pain, s = stiffness/stretching sensation, NT = not tested)  Comments:    UPPER EXTREMITY MMT:  MMT Right eval Left eval  Shoulder flexion 5 5  Shoulder extension    Shoulder abduction 5 4+ *  Shoulder extension    Shoulder internal rotation 5 5  Shoulder external rotation 5 4+ *  Elbow flexion    Elbow extension    Grip strength 40#  40#  (Blank rows = not tested)  (Key: WFL = within functional limits not formally assessed, * = concordant pain, s = stiffness/stretching sensation, NT = not tested)  Comments:   CERVICAL SPECIAL TESTS:  deferred  FUNCTIONAL TESTS:  See grip strength above  TODAY'S TREATMENT:    [facility] Adult PT Treatment:                                                DATE: 06/23/23 Therapeutic Exercise: Red band double ER 3-3-3 tempo 2x5 cues for pacing and posture Red band double scaption 3-3-3 tempo 2x5 cues for posture and comfortable ROM (about eye level) Seated chin tuck x12 cues for reduced compensations and improved posture Green band row 2x12 cues for reduced shrug Green band shoulder extension 2x8 cues for reduced elbow compensations Unresisted shrugs 3x8 emphasis on eccentric portion, breath control,  and scapular depression at bottom  HEP update + education/handout    [facility] Adult PT Treatment:                                                DATE: 06/17/23 Therapeutic Exercise: Seated UT stretch x 2 each Seated Levator stretch x 2 each  Seated Scap retraction -cues to avoid shoulder hike Seated chin tuck Red band ER bilat 10 x 2  Thoracic ext in using chair back, 2 levels , 10 each , hands behind head Open books x 4 each at wall Pec stretch in corner x3 Green band Rows 10 x 3  (1 set in doorway for HEP) Updated HEP  Modalities: HMP cervical and upper back x 10 minutes  Self Care: Posture  [facility] Adult PT Treatment:                                                DATE: 05/30/23 Therapeutic Exercise: Scapular retraction x10, cues for reduced UT compensations UT stretch x30sec BIL cues for form and comfortable ROM HEP handout + education   PATIENT EDUCATION:  Education details: rationale for interventions, HEP  Person educated: Patient Education method: Explanation, Demonstration, Tactile cues, Verbal cues Education comprehension: verbalized understanding, returned demonstration, verbal cues required, tactile cues required, and needs further education     HOME EXERCISE PROGRAM: Access Code: [email or web address] URL: [email or web address] Date: 06/23/2023 Prepared by: Fransisco Hertz  Exercises - Seated Gentle Upper Trapezius Stretch  - 2-3 x daily - 1 sets - 1-2 reps - 30sec hold - Gentle Levator Scapulae Stretch  - 2-3 x daily - 1 sets - 3 reps - Supine Cervical Retraction with Towel  - 2-3 x daily - 1 sets - 10 reps - 5 hold - Standing Shoulder Row with Anchored Resistance  - 2-3 x daily - 1 sets - 10 reps - Shoulder External Rotation and Scapular Retraction with Resistance  - 2-3 x daily - 1 sets - 5 reps - 3-3-3 tempo hold - Shoulder extension  with resistance - Neutral  - 2-3 x daily - 1 sets - 8 reps  ASSESSMENT:  CLINICAL IMPRESSION: 06/23/2023 Pt arrives w/ report of 4/10 pain low back>neck. Today continuing to work on postural endurance/mobility within pt tolerance with progressions for resistance/volume where able. Incorporation of tempo training with band work to improve muscular contraction, pt reporting improved tolerance with repetition. Requires cues as above to improve tolerance and minimize compensations. Tolerates session well overall, baseline pain throughout generally reporting improved tolerance with repetition and denies any increase in pain on departure. No adverse events. Recommend continuing along current POC in order to address relevant deficits and improve functional tolerance. Pt departs today's session in no acute distress, all voiced questions/concerns addressed appropriately from PT perspective.     EVAL: Patient is a pleasant 52 y.o. woman who was seen today for physical therapy evaluation and treatment for neck/thoracic pain ongoing since around September. Endorses increased pain/limitation with typical household tasks and prolonged activity. On exam she demonstrates concordant limitations in cervical mobility and L GH strength, postural deficits as above, and concordant TTP to cervical/periscapular musculature. Tolerates HEP and exam well without adverse event. Recommend trial of skilled PT to address aforementioned deficits with aim of improving functional tolerance and reducing pain with typical activities. Pt departs today's session in no acute distress, all voiced concerns/questions addressed appropriately from PT perspective.      OBJECTIVE IMPAIRMENTS: decreased activity tolerance, decreased endurance, decreased mobility, difficulty walking, decreased ROM, decreased strength, impaired UE functional use, postural dysfunction, and pain.   ACTIVITY LIMITATIONS: carrying, lifting, standing, and locomotion  level  PARTICIPATION LIMITATIONS: meal prep, cleaning, laundry, and community activity  PERSONAL FACTORS: Time since onset of injury/illness/exacerbation and 3+ comorbidities: Afib, HTN, CHF  are also affecting patient's functional outcome.   REHAB POTENTIAL: Good  CLINICAL DECISION MAKING: Stable/uncomplicated  EVALUATION COMPLEXITY: Low   GOALS: Goals reviewed with patient? Yes  SHORT TERM GOALS: Target date: 06/27/2023 Pt will demonstrate appropriate understanding and performance of initially prescribed HEP in order to facilitate improved independence with management of symptoms.  Baseline: HEP provided on eval 06/23/23:  reports good HEP adherence Goal status: MET  2. Pt will score greater than or equal to 66 on FOTO in order to demonstrate improved perception of function due to symptoms.  Baseline: 63 06/23/23: deferred given visit 3 Goal status: ONGOING   LONG TERM GOALS: Target date: 07/25/2023 Pt will score 69 on FOTO in order to demonstrate improved perception of function due to symptoms. Baseline: 63 Goal status: INITIAL  2. Pt will demonstrate at least 50 degrees of active cervical rotation ROM in order to demonstrate improved environmental awareness and safety with driving.  Baseline: see ROM chart above Goal status: INITIAL  3. Pt will demonstrate at least 4+/5 shoulder abduction/ER MMT bilaterally for improved symmetry of UE strength and improved tolerance to functional movements.  Baseline: see MMT chart above Goal status: INITIAL   4. Pt will report/demonstrate ability to perform typical housework for at least 30 min with less than 2 point increase in neck/back pain on NPS in order to demonstrate improved tolerance to household tasks. Baseline: up to 10/10 Goal status: INITIAL   5. Pt will demonstrate appropriate performance of final prescribed HEP in order to facilitate improved self-management of symptoms post-discharge.   Baseline: initial HEP  prescribed  Goal status: INITIAL    6. Pt will report at least 50% decrease in overall pain levels in past week in order to facilitate improved tolerance to basic ADLs/mobility.   Baseline: 2-/10/10  Goal status: INITIAL     PLAN:  PT FREQUENCY: 1x/week  PT DURATION: 8 weeks  PLANNED INTERVENTIONS: 97164- PT Re-evaluation, 97110-Therapeutic exercises, 97530- Therapeutic activity, 97112- Neuromuscular re-education, 97535- Self Care, 98119- Manual therapy, [phone]- Gait training, Patient/Family education, Balance training, Stair training, Taping, Dry Needling, Joint mobilization, Spinal mobilization, Cryotherapy, and Moist heat  PLAN FOR NEXT SESSION: Review/update HEP PRN. Work on [employer] exercises as appropriate with emphasis on postural endurance, cervical mobility, GH/periscapular strength. Symptom modification strategies as indicated/appropriate.    Ashley Murrain PT, DPT 06/23/2023 9:18 AM    For all possible CPT codes, reference the Planned Interventions line above.     Check all conditions that are expected to impact treatment: {Conditions expected to impact treatment:Musculoskeletal disorders   If treatment provided at initial evaluation, no treatment charged due to lack of authorization.

## 2023-06-22 NOTE — Patient Instructions (Addendum)
Thank you for coming in today.   Heating pad neck and shoulder long.   Continue PT.   Recheck in about 1 month.   If not good enough next step will likely be an MRI.    TENS UNIT: This is helpful for muscle pain and spasm.   Search and Purchase a TENS 7000 2nd edition at  www.tenspros.com or www.Amazon.com It should be less than $30.     TENS unit instructions: Do not shower or bathe with the unit on Turn the unit off before removing electrodes or batteries If the electrodes lose stickiness add a drop of water to the electrodes after they are disconnected from the unit and place on plastic sheet. If you continued to have difficulty, call the TENS unit company to purchase more electrodes. Do not apply lotion on the skin area prior to use. Make sure the skin is clean and dry as this will help prolong the life of the electrodes. After use, always check skin for unusual red areas, rash or other skin difficulties. If there are any skin problems, does not apply electrodes to the same area. Never remove the electrodes from the unit by pulling the wires. Do not use the TENS unit or electrodes other than as directed. Do not change electrode placement without consultating your therapist or physician. Keep 2 fingers with between each electrode. Wear time ratio is 2:1, on to off times.    For example on for 30 minutes off for 15 minutes and then on for 30 minutes off for 15 minutes

## 2023-06-23 ENCOUNTER — Encounter: Payer: Self-pay | Admitting: Physical Therapy

## 2023-06-23 ENCOUNTER — Ambulatory Visit: Payer: Medicaid Other | Admitting: Physical Therapy

## 2023-06-23 DIAGNOSIS — M546 Pain in thoracic spine: Secondary | ICD-10-CM

## 2023-06-23 DIAGNOSIS — M6281 Muscle weakness (generalized): Secondary | ICD-10-CM

## 2023-06-23 DIAGNOSIS — M542 Cervicalgia: Secondary | ICD-10-CM | POA: Diagnosis not present

## 2023-06-24 ENCOUNTER — Other Ambulatory Visit (HOSPITAL_COMMUNITY): Payer: Self-pay

## 2023-06-24 DIAGNOSIS — I5022 Chronic systolic (congestive) heart failure: Secondary | ICD-10-CM

## 2023-06-24 MED ORDER — TORSEMIDE 20 MG PO TABS: ORAL_TABLET | ORAL | 0 refills | Status: DC

## 2023-06-27 ENCOUNTER — Telehealth (HOSPITAL_COMMUNITY): Payer: Self-pay

## 2023-06-27 MED ORDER — POTASSIUM CHLORIDE CRYS ER 20 MEQ PO TBCR: 20.0000 meq | EXTENDED_RELEASE_TABLET | Freq: Every day | ORAL | 0 refills | Status: DC

## 2023-06-27 NOTE — Telephone Encounter (Signed)
Patient's klor-con  medication has been sent to pharmacy.

## 2023-06-29 ENCOUNTER — Ambulatory Visit: Payer: Medicaid Other | Admitting: Physical Therapy

## 2023-06-29 ENCOUNTER — Encounter: Payer: Self-pay | Admitting: Physical Therapy

## 2023-06-29 DIAGNOSIS — M546 Pain in thoracic spine: Secondary | ICD-10-CM

## 2023-06-29 DIAGNOSIS — M542 Cervicalgia: Secondary | ICD-10-CM | POA: Diagnosis not present

## 2023-06-29 DIAGNOSIS — M6281 Muscle weakness (generalized): Secondary | ICD-10-CM

## 2023-06-29 NOTE — Therapy (Signed)
OUTPATIENT PHYSICAL THERAPY CERVICAL TREATMENT    Patient Name: Christine Oneal MRN: [id number] DOB:1971/05/12, 52 y.o., female Today's Date: 06/29/2023  END OF SESSION:  PT End of Session - 06/29/23 0910     Visit Number 4    Number of Visits 9    Date for PT Re-Evaluation 07/25/23    Authorization Type [employer]    Authorization Time Period 06/13/23-08/10/22    Authorization - Visit Number 3    Authorization - Number of Visits 7    PT Start Time 0910   pt 25 min late   PT Stop Time 0930    PT Time Calculation (min) 20 min              Past Medical History:  Diagnosis Date   Abnormal Pap smear of cervix    Allergy    Anemia    Arthritis    Atrial fibrillation (HCC)    CHF (congestive heart failure) (HCC)    Dysrhythmia    A fib   Family history of adverse reaction to anesthesia    Fibroid    History of atrial fibrillation    History of kidney stones    Hyperparathyroidism (HCC)    Hypertension    Kidney stones    Leukocytosis, unspecified 10/18/2013   Obesity    Plantar fasciitis    Pneumonia    Past Surgical History:  Procedure Laterality Date   ATRIAL FIBRILLATION ABLATION N/A 08/03/2022   Procedure: ATRIAL FIBRILLATION ABLATION;  Surgeon: Lanier Prude, MD;  Location: [facility];  Service: Cardiovascular;  Laterality: N/A;   CARDIOVERSION N/A 05/12/2021   Procedure: CARDIOVERSION;  Surgeon: Dolores Patty, MD;  Location: [facility] ENDOSCOPY;  Service: Cardiovascular;  Laterality: N/A;   CARDIOVERSION N/A 05/29/2021   Procedure: CARDIOVERSION;  Surgeon: Dolores Patty, MD;  Location: [facility] ENDOSCOPY;  Service: Cardiovascular;  Laterality: N/A;   COLPOSCOPY     PARATHYROIDECTOMY N/A 01/17/2023   Procedure: NECK EXPLORATION WITH PARATHYROIDECTOMY;  Surgeon: Darnell Level, MD;  Location: [facility] ORS;  Service: General;  Laterality: N/A;   RIGHT/LEFT HEART CATH AND CORONARY ANGIOGRAPHY N/A 05/11/2021   Procedure: RIGHT/LEFT HEART CATH AND CORONARY  ANGIOGRAPHY;  Surgeon: Dolores Patty, MD;  Location: [facility];  Service: Cardiovascular;  Laterality: N/A;   TEE WITHOUT CARDIOVERSION N/A 05/12/2021   Procedure: TRANSESOPHAGEAL ECHOCARDIOGRAM (TEE);  Surgeon: Dolores Patty, MD;  Location: [facility] ENDOSCOPY;  Service: Cardiovascular;  Laterality: N/A;   TUBAL LIGATION  2001   Patient Active Problem List   Diagnosis Date Noted   B12 deficiency 03/02/2023   Stage 3b chronic kidney disease (CKD) (HCC) 03/02/2023   Constipation 03/02/2023   Right foot pain 12/09/2022   Urinary incontinence 08/23/2022   Chronic pain of both knees 08/23/2022   Gait instability 08/23/2022   Dysmenorrhea 05/14/2022   Left foot pain 05/14/2022   Encounter for general adult medical examination with abnormal findings 04/17/2022   Primary hyperparathyroidism (HCC) 11/25/2021   Chronic systolic CHF (congestive heart failure) (HCC) 05/26/2021   Unspecified atrial fibrillation (HCC) 05/04/2021   Anemia 09/04/2017   Morbid obesity (HCC) 09/03/2017   Reactive airway disease 09/03/2017   Vitamin D deficiency 09/03/2017   Essential hypertension 10/24/2007   Hemorrhoids 10/24/2007    PCP: Myrlene Broker, MD  REFERRING PROVIDER: Rodolph Bong, MD  REFERRING DIAG: [phone] (ICD-10-CM) - Chronic neck pain M54.6 (ICD-10-CM) - Pain in thoracic spine  THERAPY DIAG:  Cervicalgia  Pain  in thoracic spine  Muscle weakness (generalized)  Rationale for Evaluation and Treatment: Rehabilitation  ONSET DATE: September 2024  SUBJECTIVE:                                                                                                                                                                                                         SUBJECTIVE STATEMENT: 06/29/2023 Pt reports some mild neck pain but more so in lower back at present, rated at 4/10. Symptoms feeling about the same overall, fluctuating from day to day. States she saw doctor  yesterday and they gave her an HEP, wants her to follow up again in a month and may get an MRI if still having problems at that point.    EVAL: Pain seems to be slowly worsening over the past month or so. Pt cannot recall any precipitating change in activity or injury, but notes she has been busy with school and housework. No UE referral, occasionally gets headaches but not always. No N/T.  Notes her knee and her hip still bother her some with stairs and floor transfers, has been doing HEP since recent discharge.  Hand dominance: Right  PERTINENT HISTORY:  HTN, CHF, afib  PAIN:  Are you having pain: 4/10 more low back than neck today  Per eval -  Location/description: neck and upper back, central and shoulder blades ; aching Best-worst over past week: 2-10/10  - aggravating factors: prolonged activity, standing, housework, walking >62min  - Easing factors: support (pillow), medication, lying down    PRECAUTIONS: cardiac hx   WEIGHT BEARING RESTRICTIONS: No  FALLS:  Has patient fallen in last 6 months? No  LIVING ENVIRONMENT: Lives with son in apartment w/ 2STE, no steps inside  OCCUPATION: doing online classes  PLOF: Independent with some limitations due to cardiac issues and pain  PATIENT GOALS: relieve back pain  NEXT MD VISIT: January 2025  OBJECTIVE:  Note: Objective measures were completed at Evaluation unless otherwise noted.  DIAGNOSTIC FINDINGS:  05/18/23 Thoracic/Cervical XR, refer to EPIC as needed, show degenerative changes per review of impression  PATIENT SURVEYS:  FOTO 63>69  COGNITION: Overall cognitive status: Within functional limits for tasks assessed  SENSATION: Denies sensory complaints  POSTURE: forward head, R UT elevated > L  PALPATION: Concordant tenderness BIL (L>R) upper trap, levator scap, rhomboids, cervical paraspinals; L deltoid tenderness  CERVICAL ROM:   Active ROM A/PROM (deg) eval AROM  06/29/23  Flexion 100%   Extension  <25% *   Right lateral flexion 50%   Left lateral flexion 50% *  Right rotation 46 deg * 55  Left rotation 46 deg * 46   (Blank rows = not tested) (Key: WFL = within functional limits not formally assessed, * = concordant pain, s = stiffness/stretching sensation, NT = not tested)   UPPER EXTREMITY ROM:  A/PROM Right eval Left eval  Shoulder flexion WFL painless WFL painless with L shoulder discomfort  Shoulder abduction    Shoulder internal rotation    Shoulder external rotation    Elbow flexion    Elbow extension    Wrist flexion    Wrist extension     (Blank rows = not tested) (Key: WFL = within functional limits not formally assessed, * = concordant pain, s = stiffness/stretching sensation, NT = not tested)  Comments:    UPPER EXTREMITY MMT:  MMT Right eval Left eval  Shoulder flexion 5 5  Shoulder extension    Shoulder abduction 5 4+ *  Shoulder extension    Shoulder internal rotation 5 5  Shoulder external rotation 5 4+ *  Elbow flexion    Elbow extension    Grip strength 40#  40#  (Blank rows = not tested)  (Key: WFL = within functional limits not formally assessed, * = concordant pain, s = stiffness/stretching sensation, NT = not tested)  Comments:   CERVICAL SPECIAL TESTS:  deferred  FUNCTIONAL TESTS:  See grip strength above  TODAY'S TREATMENT:    [facility] Adult PT Treatment:                                                DATE: 06/29/23 Therapeutic Exercise: Seated Red  band 8 reps x 2  Bilat scap red bands 8 reps x 2  GTB Row standing 15 x 2  GTB bilat shoulder EXT 15 x 2  Pec stretch in corner 10 sec x 3  Seated thoracic ext  in chair, hands behind head  Seated chin tuck 5 sec x 10 Leavator stretch bilat x 2 each     [facility] Adult PT Treatment:                                                DATE: 06/23/23 Therapeutic Exercise: Red band double ER 3-3-3 tempo 2x5 cues for pacing and posture Red band double scaption 3-3-3 tempo 2x5 cues for posture  and comfortable ROM (about eye level) Seated chin tuck x12 cues for reduced compensations and improved posture Green band row 2x12 cues for reduced shrug Green band shoulder extension 2x8 cues for reduced elbow compensations Unresisted shrugs 3x8 emphasis on eccentric portion, breath control, and scapular depression at bottom  HEP update + education/handout    [facility] Adult PT Treatment:                                                DATE: 06/17/23 Therapeutic Exercise: Seated UT stretch x 2 each Seated Levator stretch x 2 each  Seated Scap retraction -cues to avoid shoulder hike Seated chin tuck Red band ER bilat 10 x 2  Thoracic ext in using chair back, 2 levels , 10 each ,  hands behind head Open books x 4 each at wall Pec stretch in corner x3 Green band Rows 10 x 3  (1 set in doorway for HEP) Updated HEP  Modalities: HMP cervical and upper back x 10 minutes  Self Care: Posture                                                                                                                               [facility] Adult PT Treatment:                                                DATE: 05/30/23 Therapeutic Exercise: Scapular retraction x10, cues for reduced UT compensations UT stretch x30sec BIL cues for form and comfortable ROM HEP handout + education   PATIENT EDUCATION:  Education details: rationale for interventions, HEP  Person educated: Patient Education method: Explanation, Demonstration, Tactile cues, Verbal cues Education comprehension: verbalized understanding, returned demonstration, verbal cues required, tactile cues required, and needs further education     HOME EXERCISE PROGRAM: Access Code: [email or web address] URL: [email or web address] Date: 06/23/2023 Prepared by: Fransisco Hertz  Exercises - Seated Gentle Upper Trapezius Stretch  - 2-3 x daily - 1 sets - 1-2 reps - 30sec hold - Gentle Levator Scapulae Stretch  - 2-3 x daily - 1 sets - 3 reps - Supine  Cervical Retraction with Towel  - 2-3 x daily - 1 sets - 10 reps - 5 hold - Standing Shoulder Row with Anchored Resistance  - 2-3 x daily - 1 sets - 10 reps - Shoulder External Rotation and Scapular Retraction with Resistance  - 2-3 x daily - 1 sets - 5 reps - 3-3-3 tempo hold - Shoulder extension with resistance - Neutral  - 2-3 x daily - 1 sets - 8 reps  ASSESSMENT:  CLINICAL IMPRESSION: 06/29/2023 Pt arrives w/ report of 3- 4/10 pain mid back and stiffness/soreness in neck, not pain. Cervical AROM improved for right cervical rotation. Continued with scap / cervical stabilization and trunk stretches. Pt significantly late to session today, so treatment shortened.     EVAL: Patient is a pleasant 52 y.o. woman who was seen today for physical therapy evaluation and treatment for neck/thoracic pain ongoing since around September. Endorses increased pain/limitation with typical household tasks and prolonged activity. On exam she demonstrates concordant limitations in cervical mobility and L GH strength, postural deficits as above, and concordant TTP to cervical/periscapular musculature. Tolerates HEP and exam well without adverse event. Recommend trial of skilled PT to address aforementioned deficits with aim of improving functional tolerance and reducing pain with typical activities. Pt departs today's session in no acute distress, all voiced concerns/questions addressed appropriately from PT perspective.      OBJECTIVE IMPAIRMENTS: decreased activity tolerance, decreased endurance, decreased mobility, difficulty walking, decreased ROM, decreased strength, impaired UE functional  use, postural dysfunction, and pain.   ACTIVITY LIMITATIONS: carrying, lifting, standing, and locomotion level  PARTICIPATION LIMITATIONS: meal prep, cleaning, laundry, and community activity  PERSONAL FACTORS: Time since onset of injury/illness/exacerbation and 3+ comorbidities: Afib, HTN, CHF  are also affecting patient's  functional outcome.   REHAB POTENTIAL: Good  CLINICAL DECISION MAKING: Stable/uncomplicated  EVALUATION COMPLEXITY: Low   GOALS: Goals reviewed with patient? Yes  SHORT TERM GOALS: Target date: 06/27/2023 Pt will demonstrate appropriate understanding and performance of initially prescribed HEP in order to facilitate improved independence with management of symptoms.  Baseline: HEP provided on eval 06/23/23: reports good HEP adherence Goal status: MET  2. Pt will score greater than or equal to 66 on FOTO in order to demonstrate improved perception of function due to symptoms.  Baseline: 63 06/23/23: deferred given visit 3 Goal status: ONGOING   LONG TERM GOALS: Target date: 07/25/2023 Pt will score 69 on FOTO in order to demonstrate improved perception of function due to symptoms. Baseline: 63 Goal status: INITIAL  2. Pt will demonstrate at least 50 degrees of active cervical rotation ROM in order to demonstrate improved environmental awareness and safety with driving.  Baseline: see ROM chart above 06/29/23: improved for right rotation Goal status: ONGOING  3. Pt will demonstrate at least 4+/5 shoulder abduction/ER MMT bilaterally for improved symmetry of UE strength and improved tolerance to functional movements.  Baseline: see MMT chart above Goal status: INITIAL   4. Pt will report/demonstrate ability to perform typical housework for at least 30 min with less than 2 point increase in neck/back pain on NPS in order to demonstrate improved tolerance to household tasks. Baseline: up to 10/10 Goal status: INITIAL   5. Pt will demonstrate appropriate performance of final prescribed HEP in order to facilitate improved self-management of symptoms post-discharge.   Baseline: initial HEP prescribed  Goal status: INITIAL    6. Pt will report at least 50% decrease in overall pain levels in past week in order to facilitate improved tolerance to basic ADLs/mobility.   Baseline:  2-/10/10  Goal status: INITIAL     PLAN:  PT FREQUENCY: 1x/week  PT DURATION: 8 weeks  PLANNED INTERVENTIONS: 97164- PT Re-evaluation, 97110-Therapeutic exercises, 97530- Therapeutic activity, 97112- Neuromuscular re-education, 97535- Self Care, 54098- Manual therapy, [phone]- Gait training, Patient/Family education, Balance training, Stair training, Taping, Dry Needling, Joint mobilization, Spinal mobilization, Cryotherapy, and Moist heat  PLAN FOR NEXT SESSION: Review/update HEP PRN. Work on [employer] exercises as appropriate with emphasis on postural endurance, cervical mobility, GH/periscapular strength. Symptom modification strategies as indicated/appropriate.    Jannette Spanner, PTA 06/29/23 9:31 AM Phone: [phone] Fax: [phone]    For all possible CPT codes, reference the Planned Interventions line above.     Check all conditions that are expected to impact treatment: {Conditions expected to impact treatment:Musculoskeletal disorders   If treatment provided at initial evaluation, no treatment charged due to lack of authorization.

## 2023-06-30 ENCOUNTER — Telehealth (HOSPITAL_COMMUNITY): Payer: Self-pay

## 2023-06-30 DIAGNOSIS — I5022 Chronic systolic (congestive) heart failure: Secondary | ICD-10-CM

## 2023-06-30 MED ORDER — TORSEMIDE 20 MG PO TABS: ORAL_TABLET | ORAL | 1 refills | Status: DC

## 2023-06-30 NOTE — Telephone Encounter (Signed)
error 

## 2023-07-01 NOTE — Therapy (Signed)
OUTPATIENT PHYSICAL THERAPY CERVICAL TREATMENT    Patient Name: Christine Oneal MRN: [id number] DOB:1971/03/15, 52 y.o., female Today's Date: 07/04/2023  END OF SESSION:  PT End of Session - 07/04/23 0817     Visit Number 5    Number of Visits 9    Date for PT Re-Evaluation 07/25/23    Authorization Type [employer]    Authorization Time Period 06/13/23-08/10/22    Authorization - Visit Number 4    Authorization - Number of Visits 7    PT Start Time 0822    PT Stop Time 0912    PT Time Calculation (min) 50 min    Activity Tolerance Patient tolerated treatment well;No increased pain               Past Medical History:  Diagnosis Date   Abnormal Pap smear of cervix    Allergy    Anemia    Arthritis    Atrial fibrillation (HCC)    CHF (congestive heart failure) (HCC)    Dysrhythmia    A fib   Family history of adverse reaction to anesthesia    Fibroid    History of atrial fibrillation    History of kidney stones    Hyperparathyroidism (HCC)    Hypertension    Kidney stones    Leukocytosis, unspecified 10/18/2013   Obesity    Plantar fasciitis    Pneumonia    Past Surgical History:  Procedure Laterality Date   ATRIAL FIBRILLATION ABLATION N/A 08/03/2022   Procedure: ATRIAL FIBRILLATION ABLATION;  Surgeon: Lanier Prude, MD;  Location: [facility] INVASIVE CV LAB;  Service: Cardiovascular;  Laterality: N/A;   CARDIOVERSION N/A 05/12/2021   Procedure: CARDIOVERSION;  Surgeon: Dolores Patty, MD;  Location: [facility] ENDOSCOPY;  Service: Cardiovascular;  Laterality: N/A;   CARDIOVERSION N/A 05/29/2021   Procedure: CARDIOVERSION;  Surgeon: Dolores Patty, MD;  Location: [facility] ENDOSCOPY;  Service: Cardiovascular;  Laterality: N/A;   COLPOSCOPY     PARATHYROIDECTOMY N/A 01/17/2023   Procedure: NECK EXPLORATION WITH PARATHYROIDECTOMY;  Surgeon: Darnell Level, MD;  Location: [facility] ORS;  Service: General;  Laterality: N/A;   RIGHT/LEFT HEART CATH AND CORONARY ANGIOGRAPHY N/A  05/11/2021   Procedure: RIGHT/LEFT HEART CATH AND CORONARY ANGIOGRAPHY;  Surgeon: Dolores Patty, MD;  Location: [facility] INVASIVE CV LAB;  Service: Cardiovascular;  Laterality: N/A;   TEE WITHOUT CARDIOVERSION N/A 05/12/2021   Procedure: TRANSESOPHAGEAL ECHOCARDIOGRAM (TEE);  Surgeon: Dolores Patty, MD;  Location: [facility] ENDOSCOPY;  Service: Cardiovascular;  Laterality: N/A;   TUBAL LIGATION  2001   Patient Active Problem List   Diagnosis Date Noted   B12 deficiency 03/02/2023   Stage 3b chronic kidney disease (CKD) (HCC) 03/02/2023   Constipation 03/02/2023   Right foot pain 12/09/2022   Urinary incontinence 08/23/2022   Chronic pain of both knees 08/23/2022   Gait instability 08/23/2022   Dysmenorrhea 05/14/2022   Left foot pain 05/14/2022   Encounter for general adult medical examination with abnormal findings 04/17/2022   Primary hyperparathyroidism (HCC) 11/25/2021   Chronic systolic CHF (congestive heart failure) (HCC) 05/26/2021   Unspecified atrial fibrillation (HCC) 05/04/2021   Anemia 09/04/2017   Morbid obesity (HCC) 09/03/2017   Reactive airway disease 09/03/2017   Vitamin D deficiency 09/03/2017   Essential hypertension 10/24/2007   Hemorrhoids 10/24/2007    PCP: Myrlene Broker, MD  REFERRING PROVIDER: Rodolph Bong, MD  REFERRING DIAG: [phone] (ICD-10-CM) - Chronic neck pain M54.6 (ICD-10-CM) - Pain in thoracic spine  THERAPY DIAG:  Cervicalgia  Pain in thoracic spine  Muscle weakness (generalized)  Rationale for Evaluation and Treatment: Rehabilitation  ONSET DATE: September 2024  SUBJECTIVE:                                                                                                                                                                                                         SUBJECTIVE STATEMENT: 07/04/2023 3/10 neck pain this morning. States she was a little sore for a couple hours after last session, still having trouble  with activity tolerance as her back will become painful (~5-56min)   EVAL: Pain seems to be slowly worsening over the past month or so. Pt cannot recall any precipitating change in activity or injury, but notes she has been busy with school and housework. No UE referral, occasionally gets headaches but not always. No N/T.  Notes her knee and her hip still bother her some with stairs and floor transfers, has been doing HEP since recent discharge.  Hand dominance: Right  PERTINENT HISTORY:  HTN, CHF, afib  PAIN:  Are you having pain: 3/10 R UT  Per eval -  Location/description: neck and upper back, central and shoulder blades ; aching Best-worst over past week: 2-10/10  - aggravating factors: prolonged activity, standing, housework, walking >38min  - Easing factors: support (pillow), medication, lying down    PRECAUTIONS: cardiac hx   WEIGHT BEARING RESTRICTIONS: No  FALLS:  Has patient fallen in last 6 months? No  LIVING ENVIRONMENT: Lives with son in apartment w/ 2STE, no steps inside  OCCUPATION: doing online classes  PLOF: Independent with some limitations due to cardiac issues and pain  PATIENT GOALS: relieve back pain  NEXT MD VISIT: January 10th 2025  OBJECTIVE:  Note: Objective measures were completed at Evaluation unless otherwise noted.  DIAGNOSTIC FINDINGS:  05/18/23 Thoracic/Cervical XR, refer to EPIC as needed, show degenerative changes per review of impression  PATIENT SURVEYS:  FOTO 63>69  COGNITION: Overall cognitive status: Within functional limits for tasks assessed  SENSATION: Denies sensory complaints  POSTURE: forward head, R UT elevated > L  PALPATION: Concordant tenderness BIL (L>R) upper trap, levator scap, rhomboids, cervical paraspinals; L deltoid tenderness  CERVICAL ROM:   Active ROM A/PROM (deg) eval AROM  06/29/23  Flexion 100%   Extension <25% *   Right lateral flexion 50%   Left lateral flexion 50% *   Right rotation 46  deg * 55  Left rotation 46 deg * 46   (Blank rows = not tested) (Key: WFL = within functional limits  not formally assessed, * = concordant pain, s = stiffness/stretching sensation, NT = not tested)   UPPER EXTREMITY ROM:  A/PROM Right eval Left eval  Shoulder flexion WFL painless WFL painless with L shoulder discomfort  Shoulder abduction    Shoulder internal rotation    Shoulder external rotation    Elbow flexion    Elbow extension    Wrist flexion    Wrist extension     (Blank rows = not tested) (Key: WFL = within functional limits not formally assessed, * = concordant pain, s = stiffness/stretching sensation, NT = not tested)  Comments:    UPPER EXTREMITY MMT:  MMT Right eval Left eval  Shoulder flexion 5 5  Shoulder extension    Shoulder abduction 5 4+ *  Shoulder extension    Shoulder internal rotation 5 5  Shoulder external rotation 5 4+ *  Elbow flexion    Elbow extension    Grip strength 40#  40#  (Blank rows = not tested)  (Key: WFL = within functional limits not formally assessed, * = concordant pain, s = stiffness/stretching sensation, NT = not tested)  Comments:   CERVICAL SPECIAL TESTS:  deferred  FUNCTIONAL TESTS:  See grip strength above  TODAY'S TREATMENT:    [facility] Adult PT Treatment:                                                DATE: 07/04/23 Therapeutic Exercise: Standing RTB double ER at wall 3-3-3 tempo, 3x5 cues for posture and reduced shoulder hike Standing fwd chest press 1kg ball 2x8 cues for posture and shoulder mechanics  Standing chin tuck w/ ball at wall 2x8 cues for posture and comfortable ROM Standing blue band row 2x5 HEP update + education/handout   Therapeutic Activity: Time spent w/ discussion/education on symptom behavior as it relates to functional mobility and activity tolerance, progress w/ PT thus far, relevant anatomy/physiology and rationale for interventions, gradual progression of activity paced walking Suitcase  carry 5# BIL, 2x66ft each, cues for posture and relaxing shoulder; seated rest between   [facility] Adult PT Treatment:                                                DATE: 06/29/23 Therapeutic Exercise: Seated Red  band 8 reps x 2  Bilat scap red bands 8 reps x 2  GTB Row standing 15 x 2  GTB bilat shoulder EXT 15 x 2  Pec stretch in corner 10 sec x 3  Seated thoracic ext  in chair, hands behind head  Seated chin tuck 5 sec x 10 Leavator stretch bilat x 2 each     [facility] Adult PT Treatment:                                                DATE: 06/23/23 Therapeutic Exercise: Red band double ER 3-3-3 tempo 2x5 cues for pacing and posture Red band double scaption 3-3-3 tempo 2x5 cues for posture and comfortable ROM (about eye level) Seated chin tuck x12 cues for reduced compensations and improved posture Green band  row 2x12 cues for reduced shrug Green band shoulder extension 2x8 cues for reduced elbow compensations Unresisted shrugs 3x8 emphasis on eccentric portion, breath control, and scapular depression at bottom  HEP update + education/handout    [facility] Adult PT Treatment:                                                DATE: 06/17/23 Therapeutic Exercise: Seated UT stretch x 2 each Seated Levator stretch x 2 each  Seated Scap retraction -cues to avoid shoulder hike Seated chin tuck Red band ER bilat 10 x 2  Thoracic ext in using chair back, 2 levels , 10 each , hands behind head Open books x 4 each at wall Pec stretch in corner x3 Green band Rows 10 x 3  (1 set in doorway for HEP) Updated HEP  Modalities: HMP cervical and upper back x 10 minutes  Self Care: Posture                                                                                                                               [facility] Adult PT Treatment:                                                DATE: 05/30/23 Therapeutic Exercise: Scapular retraction x10, cues for reduced UT compensations UT stretch x30sec BIL  cues for form and comfortable ROM HEP handout + education   PATIENT EDUCATION:  Education details: rationale for interventions, HEP  Person educated: Patient Education method: Explanation, Demonstration, Tactile cues, Verbal cues Education comprehension: verbalized understanding, returned demonstration, verbal cues required, tactile cues required, and needs further education     HOME EXERCISE PROGRAM: Access Code: [email or web address] URL: [email or web address] Date: 07/04/2023 Prepared by: Fransisco Hertz  Exercises - Seated Gentle Upper Trapezius Stretch  - 2-3 x daily - 1 sets - 1-2 reps - 30sec hold - Gentle Levator Scapulae Stretch  - 2-3 x daily - 1 sets - 3 reps - Standing Shoulder Row with Anchored Resistance  - 2-3 x daily - 1 sets - 10 reps - Shoulder External Rotation and Scapular Retraction with Resistance  - 2-3 x daily - 1 sets - 5 reps - 3-3-3 tempo hold - Shoulder extension with resistance - Neutral  - 2-3 x daily - 1 sets - 8 reps - Standing Isometric Cervical Retraction with Chin Tucks and Ball at [employer]  - 2-3 x daily - 1 sets - 8 reps  ASSESSMENT:  CLINICAL IMPRESSION: 07/04/2023 Pt arrives w/ 3/10 R UT pain, mild soreness after last session. Pt continues to endorse limited activity tolerance particularly in standing, increased time spent today w/ education/discussion re: functional mobility  and activity tolerance (see therac section above). Today spending majority of session w/ standing activities which pt does well with overall but does require intermittent rest breaks to mitigate increase in symptoms. HEP update as above, no adverse events, pt denies any increase in pain on departure. Notes most relief with chin tuck + ball at wall. Recommend continuing along current POC in order to address relevant deficits and improve functional tolerance. Pt departs today's session in no acute distress, all voiced questions/concerns addressed appropriately from PT perspective.      EVAL: Patient is a pleasant 52 y.o. woman who was seen today for physical therapy evaluation and treatment for neck/thoracic pain ongoing since around September. Endorses increased pain/limitation with typical household tasks and prolonged activity. On exam she demonstrates concordant limitations in cervical mobility and L GH strength, postural deficits as above, and concordant TTP to cervical/periscapular musculature. Tolerates HEP and exam well without adverse event. Recommend trial of skilled PT to address aforementioned deficits with aim of improving functional tolerance and reducing pain with typical activities. Pt departs today's session in no acute distress, all voiced concerns/questions addressed appropriately from PT perspective.      OBJECTIVE IMPAIRMENTS: decreased activity tolerance, decreased endurance, decreased mobility, difficulty walking, decreased ROM, decreased strength, impaired UE functional use, postural dysfunction, and pain.   ACTIVITY LIMITATIONS: carrying, lifting, standing, and locomotion level  PARTICIPATION LIMITATIONS: meal prep, cleaning, laundry, and community activity  PERSONAL FACTORS: Time since onset of injury/illness/exacerbation and 3+ comorbidities: Afib, HTN, CHF  are also affecting patient's functional outcome.   REHAB POTENTIAL: Good  CLINICAL DECISION MAKING: Stable/uncomplicated  EVALUATION COMPLEXITY: Low   GOALS: Goals reviewed with patient? Yes  SHORT TERM GOALS: Target date: 06/27/2023 Pt will demonstrate appropriate understanding and performance of initially prescribed HEP in order to facilitate improved independence with management of symptoms.  Baseline: HEP provided on eval 06/23/23: reports good HEP adherence Goal status: MET  2. Pt will score greater than or equal to 66 on FOTO in order to demonstrate improved perception of function due to symptoms.  Baseline: 63 06/23/23: deferred given visit 3 Goal status: ONGOING   LONG  TERM GOALS: Target date: 07/25/2023 Pt will score 69 on FOTO in order to demonstrate improved perception of function due to symptoms. Baseline: 63 Goal status: INITIAL  2. Pt will demonstrate at least 50 degrees of active cervical rotation ROM in order to demonstrate improved environmental awareness and safety with driving.  Baseline: see ROM chart above 06/29/23: improved for right rotation Goal status: ONGOING  3. Pt will demonstrate at least 4+/5 shoulder abduction/ER MMT bilaterally for improved symmetry of UE strength and improved tolerance to functional movements.  Baseline: see MMT chart above Goal status: INITIAL   4. Pt will report/demonstrate ability to perform typical housework for at least 30 min with less than 2 point increase in neck/back pain on NPS in order to demonstrate improved tolerance to household tasks. Baseline: up to 10/10 Goal status: INITIAL   5. Pt will demonstrate appropriate performance of final prescribed HEP in order to facilitate improved self-management of symptoms post-discharge.   Baseline: initial HEP prescribed  Goal status: INITIAL    6. Pt will report at least 50% decrease in overall pain levels in past week in order to facilitate improved tolerance to basic ADLs/mobility.   Baseline: 2-/10/10  Goal status: INITIAL     PLAN:  PT FREQUENCY: 1x/week  PT DURATION: 8 weeks  PLANNED INTERVENTIONS: 97164- PT Re-evaluation, 97110-Therapeutic exercises, 97530- Therapeutic  activity, [id number]- Neuromuscular re-education, [phone]- Self Care, 84696- Manual therapy, [phone]- Gait training, Patient/Family education, Balance training, Stair training, Taping, Dry Needling, Joint mobilization, Spinal mobilization, Cryotherapy, and Moist heat  PLAN FOR NEXT SESSION: Review/update HEP PRN. Work on [employer] exercises as appropriate with emphasis on postural endurance, cervical mobility, GH/periscapular strength. Symptom modification strategies as  indicated/appropriate.   Ashley Murrain PT, DPT 07/04/2023 9:20 AM   For all possible CPT codes, reference the Planned Interventions line above.     Check all conditions that are expected to impact treatment: {Conditions expected to impact treatment:Musculoskeletal disorders   If treatment provided at initial evaluation, no treatment charged due to lack of authorization.

## 2023-07-04 ENCOUNTER — Encounter: Payer: Self-pay | Admitting: Physical Therapy

## 2023-07-04 ENCOUNTER — Ambulatory Visit: Payer: Medicaid Other | Admitting: Physical Therapy

## 2023-07-04 DIAGNOSIS — M6281 Muscle weakness (generalized): Secondary | ICD-10-CM

## 2023-07-04 DIAGNOSIS — M542 Cervicalgia: Secondary | ICD-10-CM | POA: Diagnosis not present

## 2023-07-04 DIAGNOSIS — M546 Pain in thoracic spine: Secondary | ICD-10-CM

## 2023-07-06 ENCOUNTER — Other Ambulatory Visit (HOSPITAL_COMMUNITY): Payer: Self-pay | Admitting: Internal Medicine

## 2023-07-07 DIAGNOSIS — I5022 Chronic systolic (congestive) heart failure: Secondary | ICD-10-CM | POA: Diagnosis not present

## 2023-07-07 DIAGNOSIS — R32 Unspecified urinary incontinence: Secondary | ICD-10-CM | POA: Diagnosis not present

## 2023-07-07 DIAGNOSIS — I10 Essential (primary) hypertension: Secondary | ICD-10-CM | POA: Diagnosis not present

## 2023-07-17 ENCOUNTER — Other Ambulatory Visit (HOSPITAL_COMMUNITY): Payer: Self-pay | Admitting: Internal Medicine

## 2023-07-17 DIAGNOSIS — I5022 Chronic systolic (congestive) heart failure: Secondary | ICD-10-CM

## 2023-07-18 ENCOUNTER — Other Ambulatory Visit (HOSPITAL_COMMUNITY): Payer: Self-pay | Admitting: Cardiology

## 2023-07-18 MED ORDER — APIXABAN 5 MG PO TABS: 5.0000 mg | ORAL_TABLET | Freq: Two times a day (BID) | ORAL | 6 refills | Status: DC

## 2023-07-19 ENCOUNTER — Inpatient Hospital Stay (HOSPITAL_COMMUNITY): Admission: RE | Admit: 2023-07-19 | Payer: Medicaid Other | Source: Ambulatory Visit | Admitting: Internal Medicine

## 2023-07-20 ENCOUNTER — Ambulatory Visit: Payer: Medicaid Other | Admitting: Physical Therapy

## 2023-07-21 NOTE — Progress Notes (Signed)
 LILLETTE Ileana Collet, PhD, LAT, ATC acting as a scribe for Artist Lloyd, MD.  Christine Oneal is a 53 y.o. female who presents to [facility] at [facility] today for f/u neck and upper back pain. Pt was last seen by Dr. Lloyd on 06/22/23 and was advised to cont current treatment plan for another month, completing 5 PT visits.  Today, pt reports pain in her neck and upper back are about the same. Pain varies in location, typically midline of her thoracic spine, through bilat trapz and into her neck. No radicular pain or numbness/tingling.   Dx imaging: 05/18/23 T-spine & C-spine XR  Pertinent review of systems: No fevers or chills  Relevant historical information: Atrial fibrillation on Eliquis   Exam:  BP 110/80   Pulse 78   Ht 5' 3 (1.6 m)   Wt 240 lb (108.9 kg)   SpO2 99%   BMI 42.51 kg/m  General: Well Developed, well nourished, and in no acute distress.   MSK: C-spine normal appearing Nontender palpation spinal midline.  Tender palpation cervical paraspinal musculature.  Upper extremity strength is intact.  T-spine nontender normal motion.    Lab and Radiology Results  DG Cervical Spine 2 or 3 views Result Date: 05/18/2023 CLINICAL DATA:  Neck pain for 2 months. EXAM: CERVICAL SPINE - 2-3 VIEW COMPARISON:  None Available. FINDINGS: There is no evidence of cervical spine fracture or prevertebral soft tissue swelling. Straightening of cervical spine. Mild degenerative joint changes of the mid to lower cervical spine with anterior spurring and narrow intervertebral spaces. No other significant bone abnormalities are identified. 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 Electronically Signed   By: Craig Farr M.D.   On: 05/18/2023 09:41   DG Thoracic Spine 2 View Result Date: 05/18/2023 CLINICAL DATA:  Upper back pain. EXAM: THORACIC SPINE 2 VIEWS COMPARISON:  None Available. FINDINGS: There is no evidence of thoracic spine  fracture. Scoliosis. Degenerative joint changes of the spine with narrowed joint space and anterior spurring. IMPRESSION: Degenerative joint changes of the spine. Electronically Signed   By: Craig Farr M.D.   On: 05/18/2023 09:40   I, Artist Lloyd, personally (independently) visualized and performed the interpretation of the images attached in this note.     Assessment and Plan: 53 y.o. female with chronic neck and upper back pain.  She unfortunately is not improving with physical therapy.  Plan for MRI cervical spine for injection planning.  If we see a good target for epidural steroid injections we will go ahead and proceed with injection after the MRI.  If there is multiple locations or facet injections are needed will check back in clinic to go over the results of the MRI and talk about injections more thoroughly.  Reviewed epidural today.  Consider advanced imaging T-spine in the future if needed.   PDMP not reviewed this encounter. Orders Placed This Encounter  Procedures   MR CERVICAL SPINE WO CONTRAST    Standing Status:   Future    Expiration Date:   08/22/2023    What is the patient's sedation requirement?:   No Sedation    Does the patient have a pacemaker or implanted devices?:   No    Preferred imaging location?:   [facility] (table limit-350lbs)   No orders of the defined types were placed in this encounter.    Discussed warning signs or symptoms. Please see discharge instructions. Patient expresses understanding.   The  above documentation has been reviewed and is accurate and complete Artist Lloyd, M.D.

## 2023-07-22 ENCOUNTER — Ambulatory Visit: Payer: Medicaid Other | Admitting: Family Medicine

## 2023-07-22 VITALS — BP 110/80 | HR 78 | Ht 63.0 in | Wt 240.0 lb

## 2023-07-22 DIAGNOSIS — G8929 Other chronic pain: Secondary | ICD-10-CM | POA: Diagnosis not present

## 2023-07-22 DIAGNOSIS — M542 Cervicalgia: Secondary | ICD-10-CM | POA: Diagnosis not present

## 2023-07-22 DIAGNOSIS — M546 Pain in thoracic spine: Secondary | ICD-10-CM

## 2023-07-22 NOTE — Patient Instructions (Addendum)
 Thank you for coming in today.   You should hear from MRI scheduling within 1 week. If you do not hear please let me know.    I will see what the MRI shows and then will likely order an injection

## 2023-07-25 NOTE — Therapy (Signed)
 OUTPATIENT PHYSICAL THERAPY CERVICAL RECERTIFICATION + DISCHARGE    Patient Name: Christine Oneal MRN: [id number] DOB:[birth year], 53 y.o., female Today's Date: 07/26/2023   PHYSICAL THERAPY DISCHARGE SUMMARY  Visits from Start of Care: 6  Current functional level related to goals / functional outcomes: Able to perform basic tasks/mobility w/ increased pain  Remaining deficits: Pain, weakness, reduced ROM   Education / Equipment: HEP, discharge education, follow up with provider - see below for details   Patient agrees to discharge. Patient goals were partially met. Patient is being discharged due to ongoing medical work up with lack of change since start of PT.   END OF SESSION:  PT End of Session - 07/26/23 1017     Visit Number 6    Number of Visits 9    Authorization Type [employer]    Authorization Time Period 06/13/23-08/10/22    Authorization - Visit Number 5    Authorization - Number of Visits 7    PT Start Time 1017    PT Stop Time 1055    PT Time Calculation (min) 38 min    Activity Tolerance Patient tolerated treatment well;No increased pain                Past Medical History:  Diagnosis Date   Abnormal Pap smear of cervix    Allergy    Anemia    Arthritis    Atrial fibrillation (HCC)    CHF (congestive heart failure) (HCC)    Dysrhythmia    A fib   Family history of adverse reaction to anesthesia    Fibroid    History of atrial fibrillation    History of kidney stones    Hyperparathyroidism (HCC)    Hypertension    Kidney stones    Leukocytosis, unspecified 10/18/2013   Obesity    Plantar fasciitis    Pneumonia    Past Surgical History:  Procedure Laterality Date   ATRIAL FIBRILLATION ABLATION N/A 08/03/2022   Procedure: ATRIAL FIBRILLATION ABLATION;  Surgeon: Cindie Ole DASEN, MD;  Location: [facility];  Service: Cardiovascular;  Laterality: N/A;   CARDIOVERSION N/A 05/12/2021   Procedure: CARDIOVERSION;  Surgeon:  Cherrie Toribio JONELLE, MD;  Location: [facility]OSCOPY;  Service: Cardiovascular;  Laterality: N/A;   CARDIOVERSION N/A 05/29/2021   Procedure: CARDIOVERSION;  Surgeon: Cherrie Toribio JONELLE, MD;  Location: [facility] END[facility];  Service: Cardiovascular;  Laterality: N/A;   COLPOSCOPY     PARATHYROIDECTOMY N/A 01/17/2023   Procedure: NECK EXPLORATION WITH PARATHYROIDECTOMY;  Surgeon: Eletha Boas, MD;  Location: [facility] ORS;  Service: General;  Laterality: N/A;   RIGHT/LEFT HEART CATH AND CORONARY ANGIOGRAPHY N/A 05/11/2021   Procedure: RIGHT/LEFT HEART CATH AND CORONARY ANGIOGRAPHY;  Surgeon: Cherrie Toribio JONELLE, MD;  Location: [facility];  Service: Cardiovascular;  Laterality: N/A;   TEE WITHOUT CARDIOVERSION N/A 05/12/2021   Procedure: TRANSESOPHAGEAL ECHOCARDIOGRAM (TEE);  Surgeon: Cherrie Toribio JONELLE, MD;  Location: [facility] ENDOSCOPY;  Service: Cardiovascular;  Laterality: N/A;   TUBAL LIGATION  2001   Patient Active Problem List   Diagnosis Date Noted   B12 deficiency 03/02/2023   Stage 3b chronic kidney disease (CKD) (HCC) 03/02/2023   Constipation 03/02/2023   Right foot pain 12/09/2022   Urinary incontinence 08/23/2022   Chronic pain of both knees 08/23/2022   Gait instability 08/23/2022   Dysmenorrhea 05/14/2022   Left foot pain 05/14/2022   Encounter for general adult medical examination with abnormal findings 04/17/2022   Primary hyperparathyroidism (HCC)  11/25/2021   Chronic systolic CHF (congestive heart failure) (HCC) 05/26/2021   Unspecified atrial fibrillation (HCC) 05/04/2021   Anemia 09/04/2017   Morbid obesity (HCC) 09/03/2017   Reactive airway disease 09/03/2017   Vitamin D  deficiency 09/03/2017   Essential hypertension 10/24/2007   Hemorrhoids 10/24/2007    PCP: Rollene Almarie LABOR, MD  REFERRING PROVIDER: Joane Artist RAMAN, MD  REFERRING DIAG: [phone] (ICD-10-CM) - Chronic neck pain M54.6 (ICD-10-CM) - Pain in thoracic spine  THERAPY DIAG:  Cervicalgia  Pain in  thoracic spine  Muscle weakness (generalized)  Rationale for Evaluation and Treatment: Rehabilitation  ONSET DATE: September 2024  SUBJECTIVE:                                                                                                                                                                                                         SUBJECTIVE STATEMENT: 07/26/2023 Pt states she saw provider and they are ordering MRI, states they are planning for injections. Pt states she is continuing to have the same issues since start of therapy, with neck pain going down into middle of back and into hips, about the same amount of intensity/frequency. 3-4/10 low back and hip, 2/10 in neck   EVAL: Pain seems to be slowly worsening over the past month or so. Pt cannot recall any precipitating change in activity or injury, but notes she has been busy with school and housework. No UE referral, occasionally gets headaches but not always. No N/T.  Notes her knee and her hip still bother her some with stairs and floor transfers, has been doing HEP since recent discharge.  Hand dominance: Right  PERTINENT HISTORY:  HTN, CHF, afib  PAIN:  Are you having pain: see subjective above Best-worst over past week: 2-8/10  Per eval -  Location/description: neck and upper back, central and shoulder blades ; aching Best-worst over past week: 2-10/10  - aggravating factors: prolonged activity, standing, housework, walking >46min  - Easing factors: support (pillow), medication, lying down    PRECAUTIONS: cardiac hx   WEIGHT BEARING RESTRICTIONS: No  FALLS:  Has patient fallen in last 6 months? No  LIVING ENVIRONMENT: Lives with son in apartment w/ 2STE, no steps inside  OCCUPATION: doing online classes  PLOF: Independent with some limitations due to cardiac issues and pain  PATIENT GOALS: relieve back pain  NEXT MD VISIT: TBD (post MRI per pt report)  OBJECTIVE:  Note: Objective measures were  completed at Evaluation unless otherwise noted.  DIAGNOSTIC FINDINGS:  05/18/23 Thoracic/Cervical XR, refer to EPIC as needed, show degenerative  changes per review of impression  PATIENT SURVEYS:  FOTO 63>69 FOTO 07/26/23: 63   COGNITION: Overall cognitive status: Within functional limits for tasks assessed  SENSATION: Denies sensory complaints  POSTURE: forward head, R UT elevated > L  PALPATION: Concordant tenderness BIL (L>R) upper trap, levator scap, rhomboids, cervical paraspinals; L deltoid tenderness  CERVICAL ROM:   Active ROM A/PROM (deg) eval AROM  06/29/23 ROM 07/26/23    Flexion 100%    Extension <25% *    Right lateral flexion 50%    Left lateral flexion 50% *    Right rotation 46 deg * 55 55 deg  Left rotation 46 deg * 46 38 deg *   (Blank rows = not tested) (Key: WFL = within functional limits not formally assessed, * = concordant pain, s = stiffness/stretching sensation, NT = not tested)   UPPER EXTREMITY ROM:  A/PROM Right eval Left eval  Shoulder flexion WFL painless WFL painless with L shoulder discomfort  Shoulder abduction    Shoulder internal rotation    Shoulder external rotation    Elbow flexion    Elbow extension    Wrist flexion    Wrist extension     (Blank rows = not tested) (Key: WFL = within functional limits not formally assessed, * = concordant pain, s = stiffness/stretching sensation, NT = not tested)  Comments:    UPPER EXTREMITY MMT:  MMT Right eval Left eval R/L 07/26/23   Shoulder flexion 5 5   Shoulder extension     Shoulder abduction 5 4+ * 5/4+*  Shoulder extension     Shoulder internal rotation 5 5   Shoulder external rotation 5 4+ * 5/5  Elbow flexion     Elbow extension     Grip strength 40#  40#   (Blank rows = not tested)  (Key: WFL = within functional limits not formally assessed, * = concordant pain, s = stiffness/stretching sensation, NT = not tested)  Comments:   CERVICAL SPECIAL TESTS:   deferred  FUNCTIONAL TESTS:  See grip strength above  TODAY'S TREATMENT:    [facility] Adult PT Treatment:                                                DATE: 07/26/23 Therapeutic Exercise: Chin tuck w/ pillow at wall for household performance, x10, cues for appropriate setup and mechanics Blue band row x8 cues for form and home setup HEP handout + education   Therapeutic Activity: FOTO + education MSK assessment + education Education/discussion re: progress with PT, symptom behavior as it affects activity tolerance, PT goals/POC, discharge education, follow up with provider   PATIENT EDUCATION:  Education details: rationale for interventions, HEP, discharge education, follow up with provider Person educated: Patient Education method: Explanation, Demonstration, Tactile cues, Verbal cues Education comprehension: verbalized understanding, returned demonstration  HOME EXERCISE PROGRAM: Access Code: [email or web address] URL: [email or web address] Date: 07/26/2023 Prepared by: Alm Jenny  Exercises - Seated Gentle Upper Trapezius Stretch  - 2-3 x daily - 1 sets - 1-2 reps - 30sec hold - Gentle Levator Scapulae Stretch  - 2-3 x daily - 1 sets - 3 reps - Standing Shoulder Row with Anchored Resistance  - 2-3 x daily - 1 sets - 10 reps - Shoulder External Rotation and Scapular Retraction with Resistance  - 2-3 x daily - 1 sets -  5 reps - 3-3-3 tempo hold - Shoulder extension with resistance - Neutral  - 2-3 x daily - 1 sets - 8 reps - Standing Isometric Cervical Retraction with Chin Tucks and Ball at [employer]  - 2-3 x daily - 1 sets - 8 reps  ASSESSMENT:  CLINICAL IMPRESSION: 07/26/2023 Pt arrives w/ pain as above. States she followed with provider and they have ordered MRI, tentatively planning for injections. She states she does not feel significant change in pain/function since starting PT. Given ongoing work up and lack of change since beginning PT, mutual decision is made to  discharge to independent HEP at this time and continue follow up with providers. Goals assessed as below, some progress is noted but limited functional progress or change in pain. Pt does well with HEP as above, handout/education provided. All questions/concerns addressed appropriately from PT perspective, encouraged continued provider follow up and HEP performance within pt tolerance. No adverse events, does well with verbal HEP review and practice with pt selected exercise. Pt departs today's session in no acute distress, all voiced questions/concerns addressed appropriately from PT perspective.     EVAL: Patient is a pleasant 53 y.o. woman who was seen today for physical therapy evaluation and treatment for neck/thoracic pain ongoing since around September. Endorses increased pain/limitation with typical household tasks and prolonged activity. On exam she demonstrates concordant limitations in cervical mobility and L GH strength, postural deficits as above, and concordant TTP to cervical/periscapular musculature. Tolerates HEP and exam well without adverse event. Recommend trial of skilled PT to address aforementioned deficits with aim of improving functional tolerance and reducing pain with typical activities. Pt departs today's session in no acute distress, all voiced concerns/questions addressed appropriately from PT perspective.      OBJECTIVE IMPAIRMENTS: decreased activity tolerance, decreased endurance, decreased mobility, difficulty walking, decreased ROM, decreased strength, impaired UE functional use, postural dysfunction, and pain.   ACTIVITY LIMITATIONS: carrying, lifting, standing, and locomotion level  PARTICIPATION LIMITATIONS: meal prep, cleaning, laundry, and community activity  PERSONAL FACTORS: Time since onset of injury/illness/exacerbation and 3+ comorbidities: Afib, HTN, CHF  are also affecting patient's functional outcome.   REHAB POTENTIAL: Good  CLINICAL DECISION MAKING:  Stable/uncomplicated  EVALUATION COMPLEXITY: Low   GOALS: Goals reviewed with patient? Yes  SHORT TERM GOALS: Target date: 06/27/2023 Pt will demonstrate appropriate understanding and performance of initially prescribed HEP in order to facilitate improved independence with management of symptoms.  Baseline: HEP provided on eval 06/23/23: reports good HEP adherence Goal status: MET  2. Pt will score greater than or equal to 66 on FOTO in order to demonstrate improved perception of function due to symptoms.  Baseline: 63 06/23/23: deferred given visit 3 07/26/23: 63 Goal status: NOT MET  LONG TERM GOALS: Target date: 07/25/2023 Pt will score 69 on FOTO in order to demonstrate improved perception of function due to symptoms. Baseline: 63 07/26/23: 63 Goal status: NOT MET  2. Pt will demonstrate at least 50 degrees of active cervical rotation ROM in order to demonstrate improved environmental awareness and safety with driving.  Baseline: see ROM chart above 06/29/23: improved for right rotation 07/26/23: 55 deg to R, 38 deg to L Goal status: PARTIALLY MET  3. Pt will demonstrate at least 4+/5 shoulder abduction/ER MMT bilaterally for improved symmetry of UE strength and improved tolerance to functional movements.  Baseline: see MMT chart above 07/26/23: see MMT chart above Goal status: MET  4. Pt will report/demonstrate ability to perform typical housework for at  least 30 min with less than 2 point increase in neck/back pain on NPS in order to demonstrate improved tolerance to household tasks. Baseline: up to 10/10 07/26/23: up to 8/10 Goal status: NOT MET  5. Pt will demonstrate appropriate performance of final prescribed HEP in order to facilitate improved self-management of symptoms post-discharge.   Baseline: initial HEP prescribed 07/26/23: Does well with HEP practice/review in clinic, reports good performance at home Goal status: MET  6. Pt will report at least 50% decrease  in overall pain levels in past week in order to facilitate improved tolerance to basic ADLs/mobility.   Baseline: 2-/10/10 07/26/23: 2-8/10 Goal status: NOT MET     PLAN: DISCHARGE 07/26/23  PT FREQUENCY: NA  PT DURATION: NA  PLANNED INTERVENTIONS: NA  PLAN FOR NEXT SESSION: discharge to independent HEP, follow up with provider  Alm DELENA Jenny PT, DPT 07/26/2023 11:51 AM

## 2023-07-26 ENCOUNTER — Encounter: Payer: Self-pay | Admitting: Physical Therapy

## 2023-07-26 ENCOUNTER — Ambulatory Visit: Payer: Medicaid Other | Attending: Family Medicine | Admitting: Physical Therapy

## 2023-07-26 DIAGNOSIS — M542 Cervicalgia: Secondary | ICD-10-CM | POA: Diagnosis not present

## 2023-07-26 DIAGNOSIS — M546 Pain in thoracic spine: Secondary | ICD-10-CM | POA: Insufficient documentation

## 2023-07-26 DIAGNOSIS — M6281 Muscle weakness (generalized): Secondary | ICD-10-CM | POA: Diagnosis not present

## 2023-08-01 ENCOUNTER — Ambulatory Visit: Payer: Medicaid Other

## 2023-08-01 DIAGNOSIS — M47812 Spondylosis without myelopathy or radiculopathy, cervical region: Secondary | ICD-10-CM

## 2023-08-01 DIAGNOSIS — M5021 Other cervical disc displacement,  high cervical region: Secondary | ICD-10-CM | POA: Diagnosis not present

## 2023-08-01 DIAGNOSIS — M50223 Other cervical disc displacement at C6-C7 level: Secondary | ICD-10-CM | POA: Diagnosis not present

## 2023-08-01 DIAGNOSIS — M4802 Spinal stenosis, cervical region: Secondary | ICD-10-CM | POA: Diagnosis not present

## 2023-08-01 DIAGNOSIS — M542 Cervicalgia: Secondary | ICD-10-CM

## 2023-08-03 ENCOUNTER — Encounter: Payer: Medicaid Other | Admitting: Physical Therapy

## 2023-08-05 ENCOUNTER — Encounter: Payer: Self-pay | Admitting: Family Medicine

## 2023-08-05 ENCOUNTER — Telehealth: Payer: Self-pay | Admitting: Family Medicine

## 2023-08-05 DIAGNOSIS — M542 Cervicalgia: Secondary | ICD-10-CM

## 2023-08-05 NOTE — Progress Notes (Signed)
Cervical MRI shows areas of pinched nerves and areas that could produce neck pain.  We can try an epidural steroid injection.  If this does not help we can try facet injections.  Please keep me updated with how you feel after this injection.  [facility] should contact you shortly.

## 2023-08-05 NOTE — Telephone Encounter (Signed)
Epidural steroid injection ordered

## 2023-08-07 DIAGNOSIS — R32 Unspecified urinary incontinence: Secondary | ICD-10-CM | POA: Diagnosis not present

## 2023-08-07 DIAGNOSIS — I10 Essential (primary) hypertension: Secondary | ICD-10-CM | POA: Diagnosis not present

## 2023-08-07 DIAGNOSIS — I5022 Chronic systolic (congestive) heart failure: Secondary | ICD-10-CM | POA: Diagnosis not present

## 2023-08-12 ENCOUNTER — Ambulatory Visit: Payer: Medicaid Other | Admitting: Internal Medicine

## 2023-08-12 VITALS — BP 112/84 | HR 67 | Temp 98.9°F | Ht 63.0 in | Wt 235.0 lb

## 2023-08-12 DIAGNOSIS — J111 Influenza due to unidentified influenza virus with other respiratory manifestations: Secondary | ICD-10-CM | POA: Diagnosis not present

## 2023-08-12 LAB — POCT INFLUENZA A/B
Influenza A, POC: NEGATIVE
Influenza B, POC: NEGATIVE

## 2023-08-12 LAB — POC COVID19 BINAXNOW: SARS Coronavirus 2 Ag: NEGATIVE

## 2023-08-12 MED ORDER — FLUTICASONE PROPIONATE 50 MCG/ACT NA SUSP: 2.0000 | Freq: Every day | NASAL | 6 refills | Status: DC

## 2023-08-12 MED ORDER — PROMETHAZINE-DM 6.25-15 MG/5ML PO SYRP: 5.0000 mL | ORAL_SOLUTION | Freq: Four times a day (QID) | ORAL | 0 refills | Status: DC | PRN

## 2023-08-12 NOTE — Patient Instructions (Signed)
We have sent in a cough liquid promethazine/dm to use up to 3 times a day for cough. It can make you sleepy or drowsy so be careful after taking.  We have sent in flonase to use 2 sprays in each nostril daily to help reduce drainage and nose congestion.

## 2023-08-12 NOTE — Assessment & Plan Note (Signed)
POC covid-19 and flu testing done and negative. Reassurance given and rx promethazine/dm cough liquid and flonase for congestion.

## 2023-08-12 NOTE — Progress Notes (Signed)
   Subjective:   Patient ID: Christine Oneal, female    DOB: 01-10-1971, 53 y.o.   MRN: [id number]  HPI The patient is a 53 YO female coming in for flu like symptoms 2-3 days cough and running nose and chills.   Review of Systems  Constitutional:  Positive for activity change, appetite change and chills. Negative for fatigue, fever and unexpected weight change.  HENT:  Positive for congestion, postnasal drip, rhinorrhea and sinus pressure. Negative for ear discharge, ear pain, sinus pain, sneezing, sore throat, tinnitus, trouble swallowing and voice change.   Eyes: Negative.   Respiratory:  Positive for cough. Negative for chest tightness, shortness of breath and wheezing.   Cardiovascular: Negative.   Gastrointestinal: Negative.   Musculoskeletal:  Positive for myalgias.  Neurological: Negative.     Objective:  Physical Exam Constitutional:      Appearance: She is well-developed.  HENT:     Head: Normocephalic and atraumatic.     Comments: Oropharynx with redness and clear drainage, nose with swollen turbinates, TMs normal bilaterally.  Neck:     Thyroid: No thyromegaly.  Cardiovascular:     Rate and Rhythm: Normal rate and regular rhythm.  Pulmonary:     Effort: Pulmonary effort is normal. No respiratory distress.     Breath sounds: Normal breath sounds. No wheezing or rales.  Abdominal:     Palpations: Abdomen is soft.  Musculoskeletal:        General: Tenderness present.     Cervical back: Normal range of motion.  Lymphadenopathy:     Cervical: No cervical adenopathy.  Skin:    General: Skin is warm and dry.  Neurological:     Mental Status: She is alert and oriented to person, place, and time.     Vitals:   08/12/23 1112  BP: 112/84  Pulse: 67  Temp: 98.9 F (37.2 C)  TempSrc: Oral  SpO2: 98%  Weight: 235 lb (106.6 kg)  Height: 5\' 3"  (1.6 m)    Assessment & Plan:

## 2023-08-16 ENCOUNTER — Other Ambulatory Visit (HOSPITAL_COMMUNITY): Payer: Self-pay

## 2023-08-16 DIAGNOSIS — I5022 Chronic systolic (congestive) heart failure: Secondary | ICD-10-CM

## 2023-08-16 MED ORDER — POTASSIUM CHLORIDE CRYS ER 20 MEQ PO TBCR: 20.0000 meq | EXTENDED_RELEASE_TABLET | Freq: Every day | ORAL | 1 refills | Status: DC

## 2023-08-18 ENCOUNTER — Ambulatory Visit (HOSPITAL_COMMUNITY)
Admission: RE | Admit: 2023-08-18 | Discharge: 2023-08-18 | Disposition: A | Discharge status: Home / Self Care | Payer: Medicaid Other | Source: Ambulatory Visit | Attending: Internal Medicine | Admitting: Internal Medicine

## 2023-08-18 ENCOUNTER — Encounter (HOSPITAL_COMMUNITY): Payer: Self-pay | Admitting: Internal Medicine

## 2023-08-18 VITALS — BP 100/60 | HR 84 | Wt 238.4 lb

## 2023-08-18 DIAGNOSIS — Z6841 Body Mass Index (BMI) 40.0 and over, adult: Secondary | ICD-10-CM | POA: Insufficient documentation

## 2023-08-18 DIAGNOSIS — I428 Other cardiomyopathies: Secondary | ICD-10-CM | POA: Diagnosis not present

## 2023-08-18 DIAGNOSIS — Z79899 Other long term (current) drug therapy: Secondary | ICD-10-CM | POA: Diagnosis not present

## 2023-08-18 DIAGNOSIS — Z7984 Long term (current) use of oral hypoglycemic drugs: Secondary | ICD-10-CM | POA: Insufficient documentation

## 2023-08-18 DIAGNOSIS — I110 Hypertensive heart disease with heart failure: Secondary | ICD-10-CM | POA: Diagnosis not present

## 2023-08-18 DIAGNOSIS — I4819 Other persistent atrial fibrillation: Secondary | ICD-10-CM | POA: Diagnosis not present

## 2023-08-18 DIAGNOSIS — R0683 Snoring: Secondary | ICD-10-CM | POA: Diagnosis not present

## 2023-08-18 DIAGNOSIS — I10 Essential (primary) hypertension: Secondary | ICD-10-CM

## 2023-08-18 DIAGNOSIS — Z01810 Encounter for preprocedural cardiovascular examination: Secondary | ICD-10-CM

## 2023-08-18 DIAGNOSIS — E669 Obesity, unspecified: Secondary | ICD-10-CM | POA: Insufficient documentation

## 2023-08-18 DIAGNOSIS — R000 Tachycardia, unspecified: Secondary | ICD-10-CM | POA: Diagnosis not present

## 2023-08-18 DIAGNOSIS — E210 Primary hyperparathyroidism: Secondary | ICD-10-CM | POA: Diagnosis not present

## 2023-08-18 DIAGNOSIS — M47812 Spondylosis without myelopathy or radiculopathy, cervical region: Secondary | ICD-10-CM | POA: Diagnosis not present

## 2023-08-18 DIAGNOSIS — I5022 Chronic systolic (congestive) heart failure: Secondary | ICD-10-CM | POA: Insufficient documentation

## 2023-08-18 DIAGNOSIS — Z7901 Long term (current) use of anticoagulants: Secondary | ICD-10-CM | POA: Diagnosis not present

## 2023-08-18 NOTE — Patient Instructions (Signed)
 Great to see you today!!!  You have been cleared to proceed with your procedure, and hold your Eliquis  for 4 doses  CONGRATULATIONS!!! You have graduated the [facility] and have been referred to [facility] they will call you to schedule  If you have any questions or concerns before your next appointment please send us  a message through [place] or call our office at [phone].    TO LEAVE A MESSAGE FOR THE NURSE SELECT OPTION 2, PLEASE LEAVE A MESSAGE INCLUDING: YOUR NAME DATE OF BIRTH CALL BACK NUMBER REASON FOR CALL**this is important as we prioritize the call backs  YOU WILL RECEIVE A CALL BACK THE SAME DAY AS LONG AS YOU CALL BEFORE 4:00 PM

## 2023-08-18 NOTE — Progress Notes (Signed)
 Clearance form for approval to hold Eliquis  prior to [employer] epi competed by Dr Bensimhon during visit: "Ok to hold Eliquis  for 4 doses, restart as soon as possible"  Form faxed to [facility] at [phone]

## 2023-08-18 NOTE — Progress Notes (Addendum)
 Advanced Heart Failure Note   PCP: Rollene Almarie LABOR, MD HF Cardiologist: Dr. Cherrie  Chief complaint: Atrial fibrillation   HPI: Christine Oneal is a 53 y.o. 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   Admitted from 05/03/21- 05/14/21 w/ new atrial fibrillation and CHF. She was admitted under Cardiology service and AHF consulted. D/t concern for low-output HF she was started on IV milrinone . Echo with LVEF < 20%, moderate LVH, moderately reduced RV, severe MR. 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 R/LHC 10/31 with normal coronary arteries, RA 2, PA 31/8, Fick CI 2.5, PA sat 67%. Diuresed with IV lasix  then transitioned to po torsemide , weight down total of 31 lb. D/C weight 287 lb.     Unsuccessful DCCV x 3 shocks 11/22. IV amio switched to po 200 mg bid. Anticoagulated with Eliquis .    Pt had post hospital f/u w/ her PCP on 11/14 and complained of constipation and palpitations. Labs showed severe hypercalcemia and AKI secondary to overdiuresis as well as multiple medications.  Ca 13.9. SCr 1.65 (baseline 0.9). Given IV hydration and IV pamidronate . Remained in AF with RVR on admit, underwernt DCCV on 05/29/21 with brief return to NSR then back to AF. Back in NSR on day of discharge.  Echo 09/09/21 EF 50-55%   Saw Dr. Cindie in 10/23, Echo 1/24 showed EF 55-60%, mild LVH, RV ok.  now s/p AF ablation 07/2022. Amiodarone  stopped  Returns today for clearance to hold anticoagulation prior to neck injections. Having a lot of pain in neck and MRI shows multilevel degenerative changes and foraminal narrowing. No CP or SOB. Can do ADLs without problem. Remains in NSR. No palpitations   Cardiac Studies:  - Echo (1/24): EF 55-60%, RV ok  - Echo (10/22): EF < 20%, RV moderately down, severe MR, mild to mod TR  - TEE in (11/22): EF 25-30%, normal RV, mild MR.    - RHC (10/22):   Ao =  156/89 (112) LV =135/7 RA = 2 RV = 27/6 PA = 31/8 (20) PCW = 7 Fick cardiac output/index = 5.6/2.5 PVR = 2.2 WU FA sat = 99% PA sat = 67%. 69% SVC sat = 72%   1. Normal coronary arteries 2. 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 Well-compensated hemodynamics after large-volume diuresis  ROS: All systems negative except as listed in HPI, PMH and Problem List.  SH:  Social History   Socioeconomic History   Marital status: Divorced    Spouse name: Not on file   Number of children: 4   Years of education: Not on file   Highest education level: Associate degree: academic program  Occupational History   Occupation: Scientist, Research (medical): Dietitian    Occupation: unemployed  Tobacco Use   Smoking status: Never   Smokeless tobacco: Never  Vaping Use   Vaping status: Never Used  Substance and Sexual Activity   Alcohol use: No   Drug use: No   Sexual activity: Yes    Partners: Male    Birth control/protection: Surgical    Comment: BTL  Other Topics Concern   Not on file  Social History Narrative   Christine Oneal has 4 children. Three live at home with her.   Social Drivers of Health   Financial Resource Strain: Patient Declined (08/12/2023)   Overall Financial Resource Strain (CARDIA)  Difficulty of Paying Living Expenses: Patient declined  Food Insecurity: Patient Declined (08/12/2023)   Hunger Vital Sign    Worried About Running Out of Food in the Last Year: Patient declined    Ran Out of Food in the Last Year: Patient declined  Transportation Needs: No Transportation Needs (08/12/2023)   PRAPARE - Administrator, Civil Service (Medical): No    Lack of Transportation (Non-Medical): No  Physical Activity: Unknown (08/12/2023)   Exercise Vital Sign    Days of Exercise per Week: 0 days    Minutes of Exercise per Session: Not on file  Stress: No Stress Concern Present (08/12/2023)   [employer] of Occupational Health - Occupational  Stress Questionnaire    Feeling of Stress : Not at all  Social Connections: Socially Isolated (08/12/2023)   Social Connection and Isolation Panel [NHANES]    Frequency of Communication with Friends and Family: More than three times a week    Frequency of Social Gatherings with Friends and Family: Once a week    Attends Religious Services: Never    Database Administrator or Organizations: No    Attends Engineer, Structural: Not on file    Marital Status: Divorced  Catering Manager Violence: Not on file   FH:  Family History  Problem Relation Age of Onset   Hypertension Mother    Cervical cancer Mother    Hypertension Father    Stroke Father    Diabetes Sister    Hypertension Brother    Hypertension Brother    Uterine cancer Maternal Grandmother    Colon cancer Neg Hx    Stomach cancer Neg Hx    Esophageal cancer Neg Hx    Past Medical History:  Diagnosis Date   Abnormal Pap smear of cervix    Allergy    Anemia    Arthritis    Atrial fibrillation (HCC)    CHF (congestive heart failure) (HCC)    Dysrhythmia    A fib   Family history of adverse reaction to anesthesia    Fibroid    History of atrial fibrillation    History of kidney stones    Hyperparathyroidism (HCC)    Hypertension    Kidney stones    Leukocytosis, unspecified 10/18/2013   Obesity    Plantar fasciitis    Pneumonia    Current Outpatient Medications  Medication Sig Dispense Refill   acetaminophen  (TYLENOL ) 500 MG tablet Take 1,000 mg by mouth every 6 (six) hours as needed for moderate pain or headache.     AMBULATORY NON FORMULARY MEDICATION Medication Name: Diltiazem 2%/Lidocaine  2%   Using your index finger apply a small amount of medication inside the anal opening and to the external anal area tthree times daily x 6 weeks. 30 g 1   apixaban  (ELIQUIS ) 5 MG TABS tablet Take 1 tablet (5 mg total) by mouth 2 (two) times daily. 60 tablet 6   azelastine  (ASTELIN ) 0.1 % nasal spray Place 1  spray into both nostrils 2 (two) times daily. Use in each nostril as directed 30 mL 12   carvedilol  (COREG ) 6.25 MG tablet Take 1 tablet (6.25 mg total) by mouth 2 (two) times daily. 180 tablet 0   Cholecalciferol  (VITAMIN D3) 25 MCG (1000 UT) CAPS Take 2,000 Units by mouth daily in the afternoon.     cyclobenzaprine  (FLEXERIL ) 10 MG tablet Take 0.5-1 tablets (5-10 mg total) by mouth 3 (three) times daily as needed for muscle spasms.  90 tablet 1   dextromethorphan-guaiFENesin  (MUCINEX  DM) 30-600 MG 12hr tablet Take 1 tablet by mouth 2 (two) times daily as needed (congestion).     FARXIGA  10 MG TABS tablet TAKE 1 TABLET(10 MG) BY MOUTH DAILY 90 tablet 1   fluticasone  (FLONASE ) 50 MCG/ACT nasal spray Place 2 sprays into both nostrils daily. 16 g 6   gabapentin  (NEURONTIN ) 100 MG capsule Take 1 capsule (100 mg total) by mouth at bedtime. 100-300mg  daily at bedtime 60 capsule 2   hydrALAZINE  (APRESOLINE ) 50 MG tablet TAKE 1 TABLET(50 MG) BY MOUTH THREE TIMES DAILY 90 tablet 11   hydrocortisone  (ANUSOL -HC) 2.5 % rectal cream Place 1 Application rectally 2 (two) times daily. 30 g 11   isosorbide  mononitrate (IMDUR ) 30 MG 24 hr tablet Take 1 tablet (30 mg total) by mouth daily. 90 tablet 3   lidocaine  (LIDODERM ) 5 % Place 1 patch onto the skin daily. Remove & Discard patch within 12 hours or as directed by MD 30 patch 0   losartan  (COZAAR ) 50 MG tablet Take 1 tablet (50 mg total) by mouth in the morning and at bedtime. 60 tablet 5   mirabegron  ER (MYRBETRIQ ) 50 MG TB24 tablet Take 1 tablet (50 mg total) by mouth daily. 90 tablet 1   nystatin  (MYCOSTATIN /NYSTOP ) powder Apply 1 Application topically 3 (three) times daily. 60 g 11   polyethylene glycol powder (GLYCOLAX /MIRALAX ) 17 GM/SCOOP powder Take 17 g by mouth 2 (two) times daily as needed. 3350 g 11   potassium chloride  SA (KLOR-CON  M20) 20 MEQ tablet Take 1 tablet (20 mEq total) by mouth daily. 30 tablet 1   Probiotic CHEW Chew 2 capsules by mouth  daily.     promethazine -dextromethorphan (PROMETHAZINE -DM) 6.25-15 MG/5ML syrup Take 5 mLs by mouth 4 (four) times daily as needed. 118 mL 0   spironolactone  (ALDACTONE ) 25 MG tablet TAKE 1 TABLET(25 MG) BY MOUTH DAILY 90 tablet 1   torsemide  (DEMADEX ) 20 MG tablet Take 1 tablet by mouth as needed( when weight is up or notes swelling) 30 tablet 1   triamcinolone  cream (KENALOG ) 0.1 % Apply 1 Application topically 2 (two) times daily. 30 g 0   No current facility-administered medications for this encounter.   BP 100/60   Pulse 84   Wt 108.1 kg (238 lb 6.4 oz)   SpO2 100%   BMI 42.23 kg/m   Wt Readings from Last 3 Encounters:  08/18/23 108.1 kg (238 lb 6.4 oz)  08/12/23 106.6 kg (235 lb)  07/22/23 108.9 kg (240 lb)   PHYSICAL EXAM: General:  Well appearing. No resp difficulty HEENT: normal Neck: supple. no JVD. Carotids 2+ bilat; no bruits. No lymphadenopathy or thryomegaly appreciated. Cor: PMI nondisplaced. Regular rate & rhythm. No rubs, gallops or murmurs. Lungs: clear Abdomen: obese soft, nontender, nondistended. No hepatosplenomegaly. No bruits or masses. Good bowel sounds. Extremities: no cyanosis, clubbing, rash, edema Neuro: alert & orientedx3, cranial nerves grossly intact. moves all 4 extremities w/o difficulty. Affect pleasant   ASSESSMENT & PLAN:   1. Pre-operative risk - she is stable to proceed (low risk) - ok to hold Eliquis  for injections. Resume as soon as possible  2. Chronic systolic CHF with recovered EF:  - Nonischemic CMP,  tachy-mediated.   - Echo (10/22): EF < 20%, RV moderately down, severe MR, mild to mod TR - R/LHC (05/11/21): normal cors, RA 2, PA 31/8, Fick CI 2.5, PA sat 67% - TEE in (11/22): EF 25-30%, normal RV, mild MR.   -  Echo 09/09/21 EF 50-55% -> EF has normalized  - Echo (1/24): EF 55-60%, mild LVH, RV ok. - NYHA I-II. Volume status stable - Continue torsemide  20 mg daily PRN.  - Continue hydralazine  50 mg tid + Imdur  30 mg daily. -  Continue Farxiga  10 mg daily. - Continue spiro 25 mg daily. - Continue losartan  50 mg bid (had angioedema with Entresto) - Continue carvedilol  6.25 mg bid. - Recent labs reviewed from 09/06/22 and are stable  3. Atrial fibrillation, persistent: - Failed DCCV 10/22.  - Underwent attempted DC-CV 05/29/21 Brief return to NSR then back to AF.  - Converted with amio 06/01/21. Back in AF 12/23 - s/p AF ablation (1/24) - CHA2DS2VASc score 3 - In NSR today - Continue Eliquis  - ok to pause therapy as above  3. Primary hyperparathyroidism - s/p parathyroidectomy by Dr. Eletha  - Followed by Endo  4. HTN: - Controlled. - Continue current medications.  5. Snoring - HST looked ok   6. Obesity - Body mass index is 42.23 kg/m. - Has been referrred to PharmD for semaglutide to help with weight loss (a1c 5.3 on 3/24). Insurance has not approved   Kenika Sahm, MD  12:26 PM

## 2023-08-20 ENCOUNTER — Encounter: Payer: Self-pay | Admitting: Family Medicine

## 2023-08-22 ENCOUNTER — Other Ambulatory Visit (HOSPITAL_COMMUNITY): Payer: Self-pay | Admitting: Cardiology

## 2023-08-22 MED ORDER — LOSARTAN POTASSIUM 50 MG PO TABS: 50.0000 mg | ORAL_TABLET | Freq: Two times a day (BID) | ORAL | 5 refills | Status: DC

## 2023-08-26 ENCOUNTER — Inpatient Hospital Stay: Admission: RE | Admit: 2023-08-26 | Payer: Medicaid Other | Source: Ambulatory Visit

## 2023-09-01 NOTE — Discharge Instructions (Signed)

## 2023-09-02 ENCOUNTER — Ambulatory Visit
Admission: RE | Admit: 2023-09-02 | Discharge: 2023-09-02 | Disposition: A | Discharge status: Home / Self Care | Payer: Medicaid Other | Source: Ambulatory Visit | Attending: Family Medicine | Admitting: Family Medicine

## 2023-09-02 DIAGNOSIS — M4722 Other spondylosis with radiculopathy, cervical region: Secondary | ICD-10-CM | POA: Diagnosis not present

## 2023-09-02 DIAGNOSIS — M542 Cervicalgia: Secondary | ICD-10-CM

## 2023-09-02 MED ORDER — IOPAMIDOL (ISOVUE-M 300) INJECTION 61%
1.0000 mL | Freq: Once | INTRAMUSCULAR | Status: AC | PRN
  Administered 2023-09-02: 1 mL via EPIDURAL

## 2023-09-02 MED ORDER — TRIAMCINOLONE ACETONIDE 40 MG/ML IJ SUSP (RADIOLOGY)
60.0000 mg | Freq: Once | INTRAMUSCULAR | Status: AC
Start: 2023-09-02 — End: 2023-09-02
  Administered 2023-09-02: 60 mg via EPIDURAL

## 2023-10-01 ENCOUNTER — Other Ambulatory Visit: Payer: Self-pay | Admitting: Internal Medicine

## 2023-10-03 ENCOUNTER — Other Ambulatory Visit: Payer: Self-pay | Admitting: Internal Medicine

## 2023-10-03 NOTE — Telephone Encounter (Signed)
 Copied from CRM [id number]. Topic: Clinical - Medication Refill >> Oct 03, 2023 12:29 PM Eunice Blase wrote: Most Recent Primary Care Visit:  Provider: Hillard Danker A  Department: [facility]  Visit Type: ACUTE  Date: 08/12/2023  Medication: nystatin (MYCOSTATIN/NYSTOP) powder  Has the patient contacted their pharmacy? Yes (Agent: If no, request that the patient contact the pharmacy for the refill. If patient does not wish to contact the pharmacy document the reason why and proceed with request.) (Agent: If yes, when and what did the pharmacy advise?)  Is this the correct pharmacy for this prescription? Yes If no, delete pharmacy and type the correct one.  This is the patient's preferred pharmacy:  [id number] [place], [place] - [address] AT [facility] OF [facility] & [address] [place] [address] Phone: [phone] Fax: [phone]   Has the prescription been filled recently? Yes  Is the patient out of the medication? Yes  Has the patient been seen for an appointment in the last year OR does the patient have an upcoming appointment? Yes  Can we respond through MyChart? Yes  Agent: Please be advised that Rx refills may take up to 3 business days. We ask that you follow-up with your pharmacy.

## 2023-10-04 MED ORDER — NYSTATIN 100000 UNIT/GM EX POWD: 1.0000 | Freq: Three times a day (TID) | CUTANEOUS | 0 refills | Status: AC

## 2023-10-08 ENCOUNTER — Other Ambulatory Visit: Payer: Self-pay | Admitting: Internal Medicine

## 2023-10-12 DIAGNOSIS — R32 Unspecified urinary incontinence: Secondary | ICD-10-CM | POA: Diagnosis not present

## 2023-10-12 DIAGNOSIS — I10 Essential (primary) hypertension: Secondary | ICD-10-CM | POA: Diagnosis not present

## 2023-10-12 DIAGNOSIS — I5022 Chronic systolic (congestive) heart failure: Secondary | ICD-10-CM | POA: Diagnosis not present

## 2023-10-18 ENCOUNTER — Ambulatory Visit: Admitting: Family Medicine

## 2023-10-18 NOTE — Progress Notes (Unsigned)
   [name], PhD, LAT, ATC acting as a scribe for [name], MD.  Christine Oneal is a 53 y.o. female who presents to [facility] today for cont'd neck and thoracic back pain w/ MRI review. Pt was last seen by Dr. Denyse Amass on 07/22/23 and a c-spine MRI was ordered. Based on findings, ESI was ordered, performed on 09/02/23.  Today, pt reports ***  Dx imaging: 08/01/23 C-spine MRI 05/18/23 T-spine & C-spine XR   Pertinent review of systems: ***  Relevant historical information: ***   Exam:  There were no vitals taken for this visit. General: Well Developed, well nourished, and in no acute distress.   MSK: ***    Lab and Radiology Results No results found for this or any previous visit (from the past 72 hours). No results found.     Assessment and Plan: 53 y.o. female with ***   PDMP not reviewed this encounter. No orders of the defined types were placed in this encounter.  No orders of the defined types were placed in this encounter.    Discussed warning signs or symptoms. Please see discharge instructions. Patient expresses understanding.   ***

## 2023-10-18 NOTE — Progress Notes (Deleted)
   [name], PhD, LAT, ATC acting as a scribe for Christine Graham, MD.  Christine Oneal is a 53 y.o. female who presents to [facility] at [facility] today for cont'd neck and thoracic back pain w/ MRI review. Pt was last seen by Dr. Denyse Amass on 07/22/23 and a c-spine MRI was ordered. Based on findings, ESI was ordered, performed on 09/02/23.  Today, pt reports ***  Dx imaging: 08/01/23 C-spine MRI 05/18/23 T-spine & C-spine XR   Pertinent review of systems: ***  Relevant historical information: ***   Exam:  There were no vitals taken for this visit. General: Well Developed, well nourished, and in no acute distress.   MSK: ***    Lab and Radiology Results No results found for this or any previous visit (from the past 72 hours). No results found.     Assessment and Plan: 53 y.o. female with ***   PDMP not reviewed this encounter. No orders of the defined types were placed in this encounter.  No orders of the defined types were placed in this encounter.    Discussed warning signs or symptoms. Please see discharge instructions. Patient expresses understanding.   ***

## 2023-10-21 ENCOUNTER — Ambulatory Visit: Admitting: Family Medicine

## 2023-10-21 ENCOUNTER — Ambulatory Visit: Admitting: Internal Medicine

## 2023-10-21 ENCOUNTER — Encounter: Payer: Self-pay | Admitting: Internal Medicine

## 2023-10-21 VITALS — BP 96/68 | HR 75 | Ht 63.0 in | Wt 264.0 lb

## 2023-10-21 DIAGNOSIS — I4819 Other persistent atrial fibrillation: Secondary | ICD-10-CM | POA: Diagnosis not present

## 2023-10-21 DIAGNOSIS — M5412 Radiculopathy, cervical region: Secondary | ICD-10-CM

## 2023-10-21 DIAGNOSIS — I5022 Chronic systolic (congestive) heart failure: Secondary | ICD-10-CM

## 2023-10-21 DIAGNOSIS — E538 Deficiency of other specified B group vitamins: Secondary | ICD-10-CM

## 2023-10-21 DIAGNOSIS — N1832 Chronic kidney disease, stage 3b: Secondary | ICD-10-CM | POA: Diagnosis not present

## 2023-10-21 DIAGNOSIS — M791 Myalgia, unspecified site: Secondary | ICD-10-CM

## 2023-10-21 DIAGNOSIS — D649 Anemia, unspecified: Secondary | ICD-10-CM

## 2023-10-21 DIAGNOSIS — M353 Polymyalgia rheumatica: Secondary | ICD-10-CM

## 2023-10-21 LAB — COMPREHENSIVE METABOLIC PANEL WITH GFR
ALT: 13 U/L (ref 0–35)
AST: 18 U/L (ref 0–37)
Albumin: 4 g/dL (ref 3.5–5.2)
Alkaline Phosphatase: 83 U/L (ref 39–117)
BUN: 32 mg/dL — ABNORMAL HIGH (ref 6–23)
CO2: 29 meq/L (ref 19–32)
Calcium: 9.5 mg/dL (ref 8.4–10.5)
Chloride: 101 meq/L (ref 96–112)
Creatinine, Ser: 1.42 mg/dL — ABNORMAL HIGH (ref 0.40–1.20)
GFR: 42 mL/min — ABNORMAL LOW (ref >60.00)
Glucose, Bld: 94 mg/dL (ref 70–99)
Potassium: 4.4 meq/L (ref 3.5–5.1)
Sodium: 135 meq/L (ref 135–145)
Total Bilirubin: 0.8 mg/dL (ref 0.2–1.2)
Total Protein: 7.5 g/dL (ref 6.0–8.3)

## 2023-10-21 LAB — SEDIMENTATION RATE: Sed Rate: 30 mm/h (ref 0–30)

## 2023-10-21 LAB — CBC
HCT: 33.3 % — ABNORMAL LOW (ref 36.0–46.0)
Hemoglobin: 11.4 g/dL — ABNORMAL LOW (ref 12.0–15.0)
MCHC: 34.1 g/dL (ref 30.0–36.0)
MCV: 102.3 fl — ABNORMAL HIGH (ref 78.0–100.0)
Platelets: 402 10*3/uL — ABNORMAL HIGH (ref 150.0–400.0)
RBC: 3.25 Mil/uL — ABNORMAL LOW (ref 3.87–5.11)
RDW: 14.6 % (ref 11.5–15.5)
WBC: 6.9 10*3/uL (ref 4.0–10.5)

## 2023-10-21 LAB — VITAMIN B12: Vitamin B-12: 1268 pg/mL — ABNORMAL HIGH (ref 211–911)

## 2023-10-21 LAB — HEMOGLOBIN A1C: Hgb A1c MFr Bld: 5.1 % (ref 4.6–6.5)

## 2023-10-21 LAB — LIPID PANEL
Cholesterol: 185 mg/dL (ref 0–200)
HDL: 49 mg/dL (ref >39.00)
LDL Cholesterol: 122 mg/dL — ABNORMAL HIGH (ref 0–99)
NonHDL: 136.2
Total CHOL/HDL Ratio: 4
Triglycerides: 69 mg/dL (ref 0.0–149.0)
VLDL: 13.8 mg/dL (ref 0.0–40.0)

## 2023-10-21 LAB — CK: Total CK: 44 U/L (ref 7–177)

## 2023-10-21 LAB — C-REACTIVE PROTEIN: CRP: <1 mg/dL (ref 0.5–20.0)

## 2023-10-21 MED ORDER — CARVEDILOL 6.25 MG PO TABS: 6.2500 mg | ORAL_TABLET | Freq: Two times a day (BID) | ORAL | 0 refills | Status: DC

## 2023-10-21 MED ORDER — ISOSORBIDE MONONITRATE ER 30 MG PO TB24: 30.0000 mg | ORAL_TABLET | Freq: Every day | ORAL | 0 refills | Status: DC

## 2023-10-21 NOTE — Assessment & Plan Note (Signed)
 Referral to nutrition counseling to help with diet.

## 2023-10-21 NOTE — Assessment & Plan Note (Signed)
 Checking CBC. Having less regular periods and this has improved some in recent years.

## 2023-10-21 NOTE — Progress Notes (Signed)
   Subjective:   Patient ID: Christine Oneal, female    DOB: 1970-10-15, 53 y.o.   MRN: [id number]  HPI The patient is a 53 YO female coming in for needing refills on heart medicines. She is going to be no longer seeing heart failure clinic but not seeing new cardiologist until May and running out of refills. She is gaining weight overall but not sure this is fluid. She does take torsemide about every other day. Is going through perimenopause with irregular cycles. Feels this is related to weight gain.   Review of Systems  Constitutional:  Positive for activity change and unexpected weight change.  HENT: Negative.    Eyes: Negative.   Respiratory:  Negative for cough, chest tightness and shortness of breath.   Cardiovascular:  Negative for chest pain, palpitations and leg swelling.  Gastrointestinal:  Positive for constipation. Negative for abdominal distention, abdominal pain, diarrhea, nausea and vomiting.  Musculoskeletal:  Positive for arthralgias.  Skin: Negative.   Neurological: Negative.   Psychiatric/Behavioral: Negative.      Objective:  Physical Exam Constitutional:      Appearance: She is well-developed. She is obese.  HENT:     Head: Normocephalic and atraumatic.  Cardiovascular:     Rate and Rhythm: Normal rate and regular rhythm.  Pulmonary:     Effort: Pulmonary effort is normal. No respiratory distress.     Breath sounds: Normal breath sounds. No wheezing or rales.  Abdominal:     General: Bowel sounds are normal. There is no distension.     Palpations: Abdomen is soft.     Tenderness: There is no abdominal tenderness. There is no rebound.  Musculoskeletal:     Cervical back: Normal range of motion.     Right lower leg: No edema.     Left lower leg: No edema.  Skin:    General: Skin is warm and dry.  Neurological:     Mental Status: She is alert and oriented to person, place, and time.     Coordination: Coordination normal.     Vitals:   10/21/23 0847   BP: 96/68  Pulse: 86  Temp: 98.3 F (36.8 C)  SpO2: 99%  Weight: 246 lb 6.4 oz (111.8 kg)  Height: 5\' 3"  (1.6 m)    Assessment & Plan:

## 2023-10-21 NOTE — Patient Instructions (Signed)
 We have refilled the medicine until you see the new heart doctor.

## 2023-10-21 NOTE — Assessment & Plan Note (Signed)
 Checking CMP and adjust as needed. BP at goal. No known DM. Screening for that with HGA1c.

## 2023-10-21 NOTE — Assessment & Plan Note (Signed)
 Sent in imdur and coreg refills as she is running out. Would be fine with keeping coreg refill long term if needed but imdur should come from cardiology long term. She will also continue hydralazine, farxiga, losartan, spironolactone and torsemide. Checking CMP and CBC.

## 2023-10-21 NOTE — Assessment & Plan Note (Signed)
 Taking eliquis and beta blocker. Sounds regular today on exam EKG not done.

## 2023-10-21 NOTE — Patient Instructions (Addendum)
 Thank you for coming in today.   I am worried about polymyalgia rheumatica (PMR).   Please get labs today before you leave   Assuming labs are normal we should move to a neck injection again.   I have ordered the injection. Wait to schedule the injection until we get labs back looking for PMR.

## 2023-10-21 NOTE — Assessment & Plan Note (Signed)
Checking B12.

## 2023-10-24 ENCOUNTER — Encounter: Payer: Self-pay | Admitting: Internal Medicine

## 2023-10-26 ENCOUNTER — Other Ambulatory Visit (HOSPITAL_COMMUNITY): Payer: Self-pay

## 2023-10-29 ENCOUNTER — Other Ambulatory Visit (HOSPITAL_COMMUNITY): Payer: Self-pay | Admitting: Family Medicine

## 2023-10-29 DIAGNOSIS — I5022 Chronic systolic (congestive) heart failure: Secondary | ICD-10-CM

## 2023-11-02 ENCOUNTER — Other Ambulatory Visit: Payer: Self-pay | Admitting: Internal Medicine

## 2023-11-02 NOTE — Telephone Encounter (Signed)
 Copied from CRM [id number]. Topic: Clinical - Medication Refill >> Nov 02, 2023 11:55 AM Allyne Areola wrote: Most Recent Primary Care Visit:  Provider: Bambi Lever A  Department: [facility]  Visit Type: ACUTE  Date: 10/21/2023  Medication: promethazine -dextromethorphan (PROMETHAZINE -DM) 6.25-15 MG/5ML syrup  Has the patient contacted their pharmacy? Yes, asked patient to contact the ordering provider (Agent: If no, request that the patient contact the pharmacy for the refill. If patient does not wish to contact the pharmacy document the reason why and proceed with request.) (Agent: If yes, when and what did the pharmacy advise?)  Is this the correct pharmacy for this prescription? Yes If no, delete pharmacy and type the correct one.  This is the patient's preferred pharmacy:  [facility] #[id number] [place], [place] - [address] AT [facility] OF [facility] & [address] [place] [address] Phone: [phone] Fax: [phone]   Has the prescription been filled recently? No  Is the patient out of the medication? Yes  Has the patient been seen for an appointment in the last year OR does the patient have an upcoming appointment? Yes  Can we respond through MyChart? Yes  Agent: Please be advised that Rx refills may take up to 3 business days. We ask that you follow-up with your pharmacy.

## 2023-11-03 ENCOUNTER — Other Ambulatory Visit

## 2023-11-07 ENCOUNTER — Telehealth: Payer: Self-pay | Admitting: Family Medicine

## 2023-11-07 NOTE — Telephone Encounter (Signed)
 P2P for Cervical ESI completed: Christine Oneal number: [id number]  April 29-May 12

## 2023-11-07 NOTE — Telephone Encounter (Signed)
 Noted informed Christine Oneal

## 2023-11-09 ENCOUNTER — Other Ambulatory Visit

## 2023-11-14 ENCOUNTER — Ambulatory Visit: Payer: Medicaid Other | Attending: Cardiology | Admitting: Cardiology

## 2023-11-14 ENCOUNTER — Encounter: Payer: Self-pay | Admitting: Cardiology

## 2023-11-14 VITALS — BP 94/66 | HR 66 | Resp 16 | Ht 63.0 in | Wt 246.0 lb

## 2023-11-14 DIAGNOSIS — I10 Essential (primary) hypertension: Secondary | ICD-10-CM

## 2023-11-14 DIAGNOSIS — I480 Paroxysmal atrial fibrillation: Secondary | ICD-10-CM

## 2023-11-14 DIAGNOSIS — E66813 Obesity, class 3: Secondary | ICD-10-CM

## 2023-11-14 DIAGNOSIS — I4891 Unspecified atrial fibrillation: Secondary | ICD-10-CM

## 2023-11-14 DIAGNOSIS — Z6841 Body Mass Index (BMI) 40.0 and over, adult: Secondary | ICD-10-CM | POA: Diagnosis not present

## 2023-11-14 DIAGNOSIS — Z9889 Other specified postprocedural states: Secondary | ICD-10-CM | POA: Diagnosis not present

## 2023-11-14 MED ORDER — CARVEDILOL 12.5 MG PO TABS: 12.5000 mg | ORAL_TABLET | Freq: Two times a day (BID) | ORAL | 1 refills | Status: DC

## 2023-11-14 NOTE — Progress Notes (Signed)
 Cardiology Office Note:  .   Date:  11/14/2023  ID:  Christine Oneal, DOB 1971/02/22, MRN [id number] PCP: Adelia Homestead, MD  [facility] Providers Cardiologist:  Knox Perl, MD Electrophysiologist:  Boyce Byes, MD   History of Present Illness: .   Christine Oneal is a 53 y.o. African-American female patient with resistant hypertension, paroxysmal atrial fibrillation with recovered cardiomyopathy from severe LV systolic dysfunction has had a fairly turbulent course in 2022 with A-fib with RVR, LVEF <20% needing inotropic support, normal coronary arteries by cardiac catheterization.  Developed acute renal failure on chronic renal insufficiency due to medication use and also from over diuresis, eventually started on amiodarone  and underwent cardioversion on 05/29/2021 to sinus rhythm with improvement in EF to normal.  Underwent atrial fibrillation ablation in January 2024.  Amiodarone  discontinued on 11/12/2022.  She is now referred to establish general cardiology care.  Discussed the use of AI scribe software for clinical note transcription with the patient, who gave verbal consent to proceed.  History of Present Illness Christine Oneal is a 53 year old female with heart failure and atrial fibrillation who presents for a cardiovascular follow-up.  In 2022, she experienced heart failure with a heart function of 20%, nearly leading to dialysis. She underwent atrial ablation and is currently on a blood thinner. Her heart function has since normalized. Amiodarone  was discontinued in May 2024. Current medications include losartan , carvedilol , and torsemide  as needed for fluid accumulation. She lives with her 63 year old son and primarily cooks at home, occasionally eating out. She exercises by doing stretches and using a vibration plate for ten minutes daily. She is concerned about weight gain, possibly due to menopause, and has an appointment with a nutritionist to address her diet  and high cholesterol.  She does not smoke and has no diabetes. 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   Labs   Lab Results  Component Value Date   CHOL 185 10/21/2023   HDL 49.00 10/21/2023   LDLCALC 122 (H) 10/21/2023   TRIG 69.0 10/21/2023   CHOLHDL 4 10/21/2023   Lab Results  Component Value Date   NA 135 10/21/2023   K 4.4 10/21/2023   CO2 29 10/21/2023   GLUCOSE 94 10/21/2023   BUN 32 (H) 10/21/2023   CREATININE 1.42 (H) 10/21/2023   CALCIUM 9.5 10/21/2023   GFR 42.00 (L) 10/21/2023   EGFR 42 (L) 07/23/2022   GFRNONAA 41 (L) 01/12/2023      Latest Ref Rng & Units 10/21/2023    9:21 AM 05/27/2023    9:41 AM 02/18/2023   10:54 AM  BMP  Glucose 70 - 99 mg/dL 94   84   BUN 6 - 23 mg/dL 32   31   Creatinine 5.10 - 1.20 mg/dL 2.58   5.27   Sodium 782 - 145 mEq/L 135   133   Potassium 3.5 - 5.1 mEq/L 4.4   4.0   Chloride 96 - 112 mEq/L 101   101   CO2 19 - 32 mEq/L 29   26   Calcium 8.4 - 10.5 mg/dL 9.5  9.3  9.4       Latest Ref Rng & Units 10/21/2023    9:21 AM 02/18/2023   10:54 AM 01/12/2023   10:33 AM  CBC  WBC 4.0 - 10.5 K/uL 6.9  6.9  5.4   Hemoglobin 12.0 - 15.0 g/dL 42.3  11.4  11.4   Hematocrit 36.0 - 46.0 % 33.3  34.5  34.5   Platelets 150.0 - 400.0 K/uL 402.0  365.0  298    Lab Results  Component Value Date   HGBA1C 5.1 10/21/2023    Lab Results  Component Value Date   TSH 2.050 08/25/2021    ROS  Review of Systems  Cardiovascular:  Negative for chest pain, dyspnea on exertion and leg swelling.   Physical Exam:   VS:  BP 94/66 (BP Location: Left Arm, Patient Position: Sitting, Cuff Size: Large)   Pulse 66   Resp 16   Ht 5\' 3"  (1.6 m)   Wt 246 lb (111.6 kg)   SpO2 94%   BMI 43.58 kg/m    Wt Readings from Last 3 Encounters:  11/14/23 246 lb (111.6 kg)  10/21/23 264 lb (119.7 kg)  10/21/23 246 lb 6.4 oz (111.8 kg)    Physical Exam Constitutional:      Appearance: She is obese.  Neck:      Vascular: No carotid bruit or JVD.  Cardiovascular:     Rate and Rhythm: Normal rate and regular rhythm.     Pulses: Intact distal pulses.     Heart sounds: Normal heart sounds. No murmur heard.    No gallop.  Pulmonary:     Effort: Pulmonary effort is normal.     Breath sounds: Normal breath sounds.  Abdominal:     General: Bowel sounds are normal.     Palpations: Abdomen is soft.  Musculoskeletal:     Right lower leg: No edema.     Left lower leg: No edema.    Studies Reviewed: Aaron Aas    ECHOCARDIOGRAM COMPLETE 07/26/2022  1. Left ventricular ejection fraction, by estimation, is 55 to 60%. The left ventricle has normal function. The left ventricle has no regional wall motion abnormalities. There is mild left ventricular hypertrophy. Left ventricular diastolic parameters are indeterminate. 2. Right ventricular systolic function is normal. The right ventricular size is normal. Tricuspid regurgitation signal is inadequate for assessing PA pressure. 3.  No significant valvular abnormality.  EKG:    EKG Interpretation Date/Time:  Monday Nov 14 2023 09:45:35 EDT Ventricular Rate:  66 PR Interval:  120 QRS Duration:  90 QT Interval:  412 QTC Calculation: 431 R Axis:   34  Text Interpretation: EKG 11/14/2023: Normal sinus rhythm at rate of 66 bpm, normal EKG.  No change from 08/18/2023. Confirmed by Brooks Stotz, Jagadeesh ([id number]) on 11/14/2023 10:02:01 AM   Medications and allergies    Allergies  Allergen Reactions   Lisinopril-Hydrochlorothiazide Anaphylaxis    Angioedema     Current Outpatient Medications:    acetaminophen  (TYLENOL ) 500 MG tablet, Take 1,000 mg by mouth every 6 (six) hours as needed for moderate pain or headache., Disp: , Rfl:    apixaban  (ELIQUIS ) 5 MG TABS tablet, Take 1 tablet (5 mg total) by mouth 2 (two) times daily., Disp: 60 tablet, Rfl: 6   azelastine  (ASTELIN ) 0.1 % nasal spray, Place 1 spray into both nostrils 2 (two) times daily. Use in each nostril as directed,  Disp: 30 mL, Rfl: 12   Cholecalciferol  (VITAMIN D3) 25 MCG (1000 UT) CAPS, Take 2,000 Units by mouth daily in the afternoon., Disp: , Rfl:    cyclobenzaprine  (FLEXERIL ) 10 MG tablet, Take 0.5-1 tablets (5-10 mg total) by mouth 3 (three) times daily as needed for muscle spasms., Disp: 90 tablet, Rfl: 1   dextromethorphan-guaiFENesin  (MUCINEX  DM) 30-600 MG 12hr tablet, Take 1  tablet by mouth 2 (two) times daily as needed (congestion)., Disp: , Rfl:    FARXIGA  10 MG TABS tablet, TAKE 1 TABLET(10 MG) BY MOUTH DAILY, Disp: 90 tablet, Rfl: 1   fluticasone  (FLONASE ) 50 MCG/ACT nasal spray, Place 2 sprays into both nostrils daily., Disp: 16 g, Rfl: 6   gabapentin  (NEURONTIN ) 100 MG capsule, Take 1 capsule (100 mg total) by mouth at bedtime. 100-300mg  daily at bedtime, Disp: 60 capsule, Rfl: 2   hydrocortisone  (ANUSOL -HC) 2.5 % rectal cream, Place 1 Application rectally 2 (two) times daily., Disp: 30 g, Rfl: 11   lidocaine  (LIDODERM ) 5 %, Place 1 patch onto the skin daily. Remove & Discard patch within 12 hours or as directed by MD, Disp: 30 patch, Rfl: 0   losartan  (COZAAR ) 50 MG tablet, Take 1 tablet (50 mg total) by mouth in the morning and at bedtime., Disp: 60 tablet, Rfl: 5   mirabegron  ER (MYRBETRIQ ) 50 MG TB24 tablet, Take 1 tablet (50 mg total) by mouth daily., Disp: 90 tablet, Rfl: 1   nystatin  (MYCOSTATIN /NYSTOP ) powder, Apply 1 Application topically 3 (three) times daily., Disp: 60 g, Rfl: 0   NYSTATIN  powder, APPLY 1 APPLICATION TOPICALLY THREE TIMES DAILY, Disp: 60 g, Rfl: 11   polyethylene glycol powder (GLYCOLAX /MIRALAX ) 17 GM/SCOOP powder, Take 17 g by mouth 2 (two) times daily as needed., Disp: 3350 g, Rfl: 11   potassium chloride  SA (KLOR-CON  M20) 20 MEQ tablet, Take 1 tablet (20 mEq total) by mouth daily., Disp: 30 tablet, Rfl: 1   Probiotic CHEW, Chew 2 capsules by mouth daily., Disp: , Rfl:    promethazine -dextromethorphan (PROMETHAZINE -DM) 6.25-15 MG/5ML syrup, Take 5 mLs by mouth 4  (four) times daily as needed., Disp: 118 mL, Rfl: 0   spironolactone  (ALDACTONE ) 25 MG tablet, TAKE 1 TABLET(25 MG) BY MOUTH DAILY, Disp: 90 tablet, Rfl: 1   torsemide  (DEMADEX ) 20 MG tablet, Take 1 tablet by mouth as needed( when weight is up or notes swelling), Disp: 30 tablet, Rfl: 1   triamcinolone  cream (KENALOG ) 0.1 %, Apply 1 Application topically 2 (two) times daily., Disp: 30 g, Rfl: 0   carvedilol  (COREG ) 12.5 MG tablet, Take 1 tablet (12.5 mg total) by mouth 2 (two) times daily., Disp: 180 tablet, Rfl: 1   Meds ordered this encounter  Medications   carvedilol  (COREG ) 12.5 MG tablet    Sig: Take 1 tablet (12.5 mg total) by mouth 2 (two) times daily.    Dispense:  180 tablet    Refill:  1    **Patient requests 90 days supply**ZERO refills remain on this prescription. Your patient is requesting advance approval of refills for this medication to PREVENT ANY MISSED DOSES     Medications Discontinued During This Encounter  Medication Reason   AMBULATORY NON FORMULARY MEDICATION Not covered by the pt's insurance   isosorbide  mononitrate (IMDUR ) 30 MG 24 hr tablet Discontinued by provider   hydrALAZINE  (APRESOLINE ) 50 MG tablet Discontinued by provider   carvedilol  (COREG ) 6.25 MG tablet Reorder     ASSESSMENT AND PLAN: .      ICD-10-CM   1. Essential hypertension  I10 carvedilol  (COREG ) 12.5 MG tablet    2. Paroxysmal atrial fibrillation (HCC)  I48.0 EKG 12-Lead    3. History of cardiac ablation for atrial fibrillation (HCC) 08/03/2022  Z98.890    I48.91     4. Class 3 severe obesity due to excess calories with serious comorbidity and body mass index (BMI) of 40.0 to 44.9 in  adult  [phone]    Z68.41       Assessment and Plan Assessment & Plan Heart failure with recovered LVEF   Heart function, previously at 20% in 2022, is now normalized according to the latest echocardiogram. No activity restrictions are necessary. Emphasize weight management and dietary changes to  prevent recurrence, including avoiding salty and fried foods to reduce fluid retention and potential need for diuretics. Continue torsemide  as needed for fluid retention and monitor for weight gain of 3 pounds in 3 days. Encourage dietary changes by reducing salt, eliminating fried foods, and avoiding high-calorie snacks. Encourage daily physical activity, starting with 15-minute walks and increasing to 30-45 minutes.  Paroxysmal atrial fibrillation   EKG shows normal sinus rhythm following atrial fibrillation ablation in January 2024. Amiodarone  was discontinued in May 2024. Emphasize weight loss to maintain sinus rhythm and discuss completing a sleep study to address potential sleep apnea affecting heart rhythm. Encourage weight loss and complete the scheduled sleep study.  Hypertension   Blood pressure is low at 94/66 mmHg due to multiple medications. Adjustments were made to optimize blood pressure control and reduce medication burden. Hydralazine  and isosorbide  mononitrate were stopped to simplify the regimen, and carvedilol  was increased to improve heart function and blood pressure control. Increase carvedilol  from 6.25 mg to 12.5 mg twice daily and monitor blood pressure regularly.  Obesity   BMI is 43, indicating obesity. Discuss the importance of weight loss for overall health improvement and reduction of medication dependency. Encourage lifestyle changes to achieve weight loss goals, setting a target of at least 6-pound weight loss in 6 weeks to improve health outcomes and potentially reduce medication needs. Encourage weight loss through dietary changes and increased physical activity. Follow up with a nutritionist for a personalized dietary plan.  Hyperlipidemia   Cholesterol levels are elevated. Discuss dietary modifications to manage cholesterol levels, including reducing sugar intake and avoiding high-calorie beverages. Emphasize consulting with a nutritionist for personalized dietary  guidance. Consult with a nutritionist for dietary guidance and encourage dietary changes by reducing sugar intake, avoiding sweetened beverages, and focusing on healthy eating habits.  She appears very motivated and making lifestyle changes.  I also encouraged her that she could return back to meaningful employment without restrictions from cardiac standpoint  Signed,  Knox Perl, MD, [facility] 11/14/2023, 6:33 PM [facility] [address] [place], [place] [address] Phone: [phone]. Fax:  [phone]

## 2023-11-14 NOTE — Patient Instructions (Signed)
 Medication Instructions:  Your physician has recommended you make the following change in your medication:   1) INCREASE carvedilol  (Coreg ) to 12.5 mg twice daily  *If you need a refill on your cardiac medications before your next appointment, please call your pharmacy*  Follow-Up: At [facility], you and your health needs are our priority.  As part of our continuing mission to provide you with exceptional heart care, our providers are all part of one team.  This team includes your primary Cardiologist (physician) and Advanced Practice Providers or APPs (Physician Assistants and Nurse Practitioners) who all work together to provide you with the care you need, when you need it.  Your next appointment:   3 month(s)  The format for your next appointment:   In Person  Provider:   Knox Perl, MD{  We recommend signing up for the patient portal called "MyChart".  Sign up information is provided on this After Visit Summary.  MyChart is used to connect with patients for Virtual Visits (Telemedicine).  Patients are able to view lab/test results, encounter notes, upcoming appointments, etc.  Non-urgent messages can be sent to your provider as well.   To learn more about what you can do with MyChart, go to [email or web address].

## 2023-11-15 NOTE — Discharge Instructions (Signed)
Post Procedure Spinal Discharge Instruction Sheet  You may resume a regular diet and any medications that you routinely take (including pain medications) unless otherwise noted by MD.  No driving day of procedure.  Light activity throughout the rest of the day.  Do not do any strenuous work, exercise, bending or lifting.  The day following the procedure, you can resume normal physical activity but you should refrain from exercising or physical therapy for at least three days thereafter.  You may apply ice to the injection site, 20 minutes on, 20 minutes off, as needed. Do not apply ice directly to skin.    Common Side Effects:  Headaches- take your usual medications as directed by your physician.  Increase your fluid intake.  Caffeinated beverages may be helpful.  Lie flat in bed until your headache resolves.  Restlessness or inability to sleep- you may have trouble sleeping for the next few days.  Ask your referring physician if you need any medication for sleep.  Facial flushing or redness- should subside within a few days.  Increased pain- a temporary increase in pain a day or two following your procedure is not unusual.  Take your pain medication as prescribed by your referring physician.  Leg cramps  Please contact our office at [phone] for the following symptoms: Fever greater than 100 degrees. Headaches unresolved with medication after 2-3 days. Increased swelling, pain, or redness at injection site.   Thank you for visiting [facility] today.   YOU MAY RESUME YOUR ELIQUIS IN 24 HOURS.

## 2023-11-16 ENCOUNTER — Inpatient Hospital Stay
Admission: RE | Admit: 2023-11-16 | Discharge: 2023-11-16 | Disposition: A | Discharge status: Home / Self Care | Source: Ambulatory Visit | Attending: Family Medicine | Admitting: Family Medicine

## 2023-11-17 ENCOUNTER — Ambulatory Visit: Admitting: Dietician

## 2023-11-18 NOTE — Discharge Instructions (Addendum)
 Post Procedure Spinal Discharge Instruction Sheet  You may resume a regular diet and any medications that you routinely take (including pain medications) unless otherwise noted by MD.  No driving day of procedure.  Light activity throughout the rest of the day.  Do not do any strenuous work, exercise, bending or lifting.  The day following the procedure, you can resume normal physical activity but you should refrain from exercising or physical therapy for at least three days thereafter.  You may apply ice to the injection site, 20 minutes on, 20 minutes off, as needed. Do not apply ice directly to skin.    Common Side Effects:  Headaches- take your usual medications as directed by your physician.  Increase your fluid intake.  Caffeinated beverages may be helpful.  Lie flat in bed until your headache resolves.  Restlessness or inability to sleep- you may have trouble sleeping for the next few days.  Ask your referring physician if you need any medication for sleep.  Facial flushing or redness- should subside within a few days.  Increased pain- a temporary increase in pain a day or two following your procedure is not unusual.  Take your pain medication as prescribed by your referring physician.  Leg cramps  Please contact our office at [phone] for the following symptoms: Fever greater than 100 degrees. Headaches unresolved with medication after 2-3 days. Increased swelling, pain, or redness at injection site.  MAY RESUME TAKING YOUR ELIQUIS  IN 24 HOURS.  Thank you for visiting [facility] today.

## 2023-11-21 DIAGNOSIS — M79604 Pain in right leg: Secondary | ICD-10-CM | POA: Diagnosis not present

## 2023-11-21 DIAGNOSIS — M79662 Pain in left lower leg: Secondary | ICD-10-CM | POA: Diagnosis not present

## 2023-11-21 DIAGNOSIS — M7989 Other specified soft tissue disorders: Secondary | ICD-10-CM | POA: Diagnosis not present

## 2023-11-21 DIAGNOSIS — M79661 Pain in right lower leg: Secondary | ICD-10-CM | POA: Diagnosis not present

## 2023-11-21 DIAGNOSIS — I83893 Varicose veins of bilateral lower extremities with other complications: Secondary | ICD-10-CM | POA: Diagnosis not present

## 2023-11-21 DIAGNOSIS — I87393 Chronic venous hypertension (idiopathic) with other complications of bilateral lower extremity: Secondary | ICD-10-CM | POA: Diagnosis not present

## 2023-11-22 ENCOUNTER — Inpatient Hospital Stay
Admission: RE | Admit: 2023-11-22 | Discharge: 2023-11-22 | Disposition: A | Discharge status: Home / Self Care | Source: Ambulatory Visit | Attending: Family Medicine | Admitting: Family Medicine

## 2023-11-24 NOTE — Discharge Instructions (Signed)
 Post Procedure Spinal Discharge Instruction Sheet  You may resume a regular diet and any medications that you routinely take (including pain medications) unless otherwise noted by MD.  No driving day of procedure.  Light activity throughout the rest of the day.  Do not do any strenuous work, exercise, bending or lifting.  The day following the procedure, you can resume normal physical activity but you should refrain from exercising or physical therapy for at least three days thereafter.  You may apply ice to the injection site, 20 minutes on, 20 minutes off, as needed. Do not apply ice directly to skin.    Common Side Effects:  Headaches- take your usual medications as directed by your physician.  Increase your fluid intake.  Caffeinated beverages may be helpful.  Lie flat in bed until your headache resolves.  Restlessness or inability to sleep- you may have trouble sleeping for the next few days.  Ask your referring physician if you need any medication for sleep.  Facial flushing or redness- should subside within a few days.  Increased pain- a temporary increase in pain a day or two following your procedure is not unusual.  Take your pain medication as prescribed by your referring physician.  Leg cramps  Please contact our office at [phone] for the following symptoms: Fever greater than 100 degrees. Headaches unresolved with medication after 2-3 days. Increased swelling, pain, or redness at injection site.  **MAY RESUME TAKING YOUR ELIQUIS  IN 24 HOURS**  Thank you for visiting [facility] today.

## 2023-11-28 ENCOUNTER — Inpatient Hospital Stay
Admission: RE | Admit: 2023-11-28 | Discharge: 2023-11-28 | Disposition: A | Discharge status: Home / Self Care | Source: Ambulatory Visit | Attending: Family Medicine | Admitting: Family Medicine

## 2023-11-30 DIAGNOSIS — I5022 Chronic systolic (congestive) heart failure: Secondary | ICD-10-CM | POA: Diagnosis not present

## 2023-11-30 DIAGNOSIS — I10 Essential (primary) hypertension: Secondary | ICD-10-CM | POA: Diagnosis not present

## 2023-11-30 DIAGNOSIS — R32 Unspecified urinary incontinence: Secondary | ICD-10-CM | POA: Diagnosis not present

## 2023-12-01 ENCOUNTER — Encounter: Attending: Internal Medicine | Admitting: Dietician

## 2023-12-01 ENCOUNTER — Encounter: Payer: Self-pay | Admitting: Dietician

## 2023-12-01 NOTE — Progress Notes (Signed)
 Medical Nutrition Therapy  Appointment Start time:  [phone]  Appointment End time:  [phone]  Primary concerns today: want to continue to lose weight; back to her old eating habits  Referral diagnosis: E66.01 (Obesity) Preferred learning style: no preference indicated (auditory, visual, hands on, no preference indicated) Learning readiness: preparation (not ready, contemplating, ready, change in progress)  NUTRITION ASSESSMENT  Anthropometrics Weight: 246.0 lbs Height: 63 in  Clinical Medical Hx: CHF, HTN, obesity Medications: Eliquis , Coreg , Vit D, cyclobenzaprine , Farxiga ,  Labs: Vit B12 1,268, hemoglobin 11.4, HCT 33.3, RBC 3.25, BUN 32, Creatinine, ser 1.42, GFR 42, LDL 122 Notable Signs/Symptoms: none noted  Lifestyle & Dietary Hx Pt states she does not use regular sugar anymore, stating she uses sweet-n-low. Pt states she watches her sodium intake, stating she is trying to cut back on fried food and try to bake her food. Pt states her PCP said she should get no more than 2,000 mg of sodium.  Estimated daily fluid intake: 64 oz (1800-2000 ml) Supplements: Vit D Sleep: pretty good, 5-6 hours a night Stress / self-care: 2 on a scale of 1-10; go outside helps with stress Current average weekly physical activity: short brisk walk every other day 10 minutes, exercise machine 20 minutes (10 minutes twice a day)  24-Hr Dietary Recall First Meal: 2 boiled eggs, bacon (1), toast, coffee Snack: pretzels or fruit (grapes, watermelon) or nuts Second Meal: [place] sandwich or hamburger with french fries or eat out Snack: pretzels or fruit (grapes, watermelon) or nuts Third Meal: baked chicken or fried fish, greens or string bean or corn or mashed/baked potato with cheese, or broccoli with cheese Snack: fruit or chips Beverages: coffee, orange juice, cranberry juice, water , diet sprite  NUTRITION DIAGNOSIS  NB-1.1 Food and nutrition-related knowledge deficit As related to excess weight.  As  evidenced by food and activity recall.  NUTRITION INTERVENTION  Nutrition education (E-1) on the following topics:  Encouraged patient to honor their body's internal hunger and fullness cues.  Throughout the day, check in mentally and rate hunger. Stop eating when satisfied not full regardless of how much food is left on the plate.  Get more if still hungry 20-30 minutes later.  The key is to honor satisfaction so throughout the meal, rate fullness factor and stop when comfortably satisfied not physically full. The key is to honor hunger and fullness without any feelings of guilt or shame.  Pay attention to what the internal cues are, rather than any external factors. This will enhance the confidence you have in listening to your own body and following those internal cues enabling you to increase how often you eat when you are hungry not out of appetite and stop when you are satisfied not full.  Encouraged pt to continue to eat balanced meals inclusive of non starchy vegetables 2 times a day 7 days a week Encouraged pt to choose lean protein sources: limiting beef, pork, sausage, hotdogs, and lunch meat Encourage pt to choose healthy fats such as plant based limiting animal fats Encouraged pt to continue to drink a minium 64 fluid ounces with half being plain water  to satisfy proper hydration   Physical activity offers extensive health benefits, improving both physical and mental well-being. It aids in weight management, significantly reduces the risk of chronic diseases like heart disease, diabetes, and some cancers, and strengthens bones and muscles. Beyond physical gains, it profoundly impacts mental health by reducing stress, anxiety, and depression, improving mood, and enhancing cognitive function and sleep  quality. Ultimately, regular physical activity boosts energy levels and contributes to a longer, healthier life. Fruits & Vegetables: Aim to fill half your plate with a variety of fruits and  vegetables. They are rich in vitamins, minerals, and fiber, and can help reduce the risk of chronic diseases. Choose a colorful assortment of fruits and vegetables to ensure you get a wide range of nutrients. Grains and Starches: Make at least half of your grain choices whole grains, such as [place] [place]ra rice, whole wheat bread, and oats. Whole grains provide fiber, which aids in digestion and healthy cholesterol levels. Aim for whole forms of starchy vegetables such as potatoes, sweet potatoes, beans, peas, and corn, which are fiber rich and provide many vitamins and minerals.  Protein: Incorporate lean sources of protein, such as poultry, fish, beans, nuts, and seeds, into your meals. Protein is essential for building and repairing tissues, staying full, balancing blood sugar, as well as supporting immune function. Dairy: Include low-fat or fat-free dairy products like milk, yogurt, and cheese in your diet. Dairy foods are excellent sources of calcium and vitamin D , which are crucial for bone health.  Handouts Provided Include  Types of Fat (saturated vs unsaturated)  Learning Style & Readiness for Change Teaching method utilized: Visual & Auditory  Demonstrated degree of understanding via: Teach Back  Barriers to learning/adherence to lifestyle change: nothing identified  Goals Established by Pt Increase physical activity; walk daily, in addition to machines  Reduce fast food; increase non-starchy vegetables; aim for 2+ servings per day Improve sleep quality and durations; aim for 7-8 hours per night; put phone down at night  MONITORING & EVALUATION Dietary intake, weekly physical activity.  Next Steps  Patient is to return in 3 months for follow-up.

## 2023-12-15 ENCOUNTER — Ambulatory Visit: Payer: Self-pay | Admitting: Family Medicine

## 2023-12-15 NOTE — Progress Notes (Signed)
 Further rheumatology labs are normal.

## 2023-12-31 DIAGNOSIS — I5022 Chronic systolic (congestive) heart failure: Secondary | ICD-10-CM | POA: Diagnosis not present

## 2023-12-31 DIAGNOSIS — I10 Essential (primary) hypertension: Secondary | ICD-10-CM | POA: Diagnosis not present

## 2023-12-31 DIAGNOSIS — R32 Unspecified urinary incontinence: Secondary | ICD-10-CM | POA: Diagnosis not present

## 2024-01-09 ENCOUNTER — Other Ambulatory Visit (HOSPITAL_BASED_OUTPATIENT_CLINIC_OR_DEPARTMENT_OTHER): Payer: Self-pay

## 2024-01-09 DIAGNOSIS — I5022 Chronic systolic (congestive) heart failure: Secondary | ICD-10-CM

## 2024-01-09 MED ORDER — TORSEMIDE 20 MG PO TABS: ORAL_TABLET | ORAL | 3 refills | Status: AC

## 2024-01-16 ENCOUNTER — Telehealth: Payer: Self-pay | Admitting: Cardiology

## 2024-01-16 ENCOUNTER — Other Ambulatory Visit (HOSPITAL_COMMUNITY): Payer: Self-pay

## 2024-01-16 DIAGNOSIS — I5022 Chronic systolic (congestive) heart failure: Secondary | ICD-10-CM

## 2024-01-16 MED ORDER — POTASSIUM CHLORIDE CRYS ER 20 MEQ PO TBCR: 20.0000 meq | EXTENDED_RELEASE_TABLET | Freq: Every day | ORAL | 3 refills | Status: AC

## 2024-01-16 NOTE — Telephone Encounter (Signed)
 done

## 2024-01-16 NOTE — Telephone Encounter (Signed)
*  STAT* If patient is at the pharmacy, call can be transferred to refill team.   1. Which medications need to be refilled? (please list name of each medication and dose if known)   potassium chloride  SA (KLOR-CON  M20) 20 MEQ tablet   2. Would you like to learn more about the convenience, safety, & potential cost savings by using the [facility]?   3. Are you open to using the [facility] (Type [facility]. ).  4. Which pharmacy/location (including street and city if local pharmacy) is medication to be sent to?  [id number] - [place], [place] - [address] AT [facility] [address]   5. Do they need a 30 day or 90 day supply?   30 day  Patient stated she is completely out of this medication.

## 2024-01-17 ENCOUNTER — Telehealth: Payer: Self-pay | Admitting: Cardiology

## 2024-01-17 MED ORDER — FARXIGA 10 MG PO TABS: 10.0000 mg | ORAL_TABLET | Freq: Every day | ORAL | 3 refills | Status: AC

## 2024-01-17 NOTE — Telephone Encounter (Signed)
 Pt's medication was sent to pt's pharmacy as requested. Confirmation received.

## 2024-01-17 NOTE — Telephone Encounter (Signed)
*  STAT* If patient is at the pharmacy, call can be transferred to refill team.   1. Which medications need to be refilled? (please list name of each medication and dose if known) FARXIGA  10 MG TABS tablet   2. Which pharmacy/location (including street and city if local pharmacy) is medication to be sent to?  [id number] - [place], [place] - [address] AT [facility] [address]    3. Do they need a 30 day or 90 day supply? 90

## 2024-01-24 ENCOUNTER — Telehealth: Payer: Self-pay | Admitting: Cardiology

## 2024-01-24 MED ORDER — SPIRONOLACTONE 25 MG PO TABS: 25.0000 mg | ORAL_TABLET | Freq: Every day | ORAL | 3 refills | Status: AC

## 2024-01-24 NOTE — Telephone Encounter (Signed)
 Pt's medication was sent to pt's pharmacy as requested. Confirmation received.

## 2024-01-24 NOTE — Telephone Encounter (Signed)
*  STAT* If patient is at the pharmacy, call can be transferred to refill team.   1. Which medications need to be refilled? (please list name of each medication and dose if known) spironolactone  (ALDACTONE ) 25 MG tablet    4. Which pharmacy/location (including street and city if local pharmacy) is medication to be sent to?  [id number] - [place], [place] - [address] AT [facility] [address]     5. Do they need a 30 day or 90 day supply? 90   No longer CHF pt , next appt is 02/20/24 with Dr. Ganji

## 2024-01-31 ENCOUNTER — Telehealth: Payer: Self-pay

## 2024-01-31 DIAGNOSIS — R32 Unspecified urinary incontinence: Secondary | ICD-10-CM | POA: Diagnosis not present

## 2024-01-31 DIAGNOSIS — I5022 Chronic systolic (congestive) heart failure: Secondary | ICD-10-CM | POA: Diagnosis not present

## 2024-01-31 DIAGNOSIS — I10 Essential (primary) hypertension: Secondary | ICD-10-CM | POA: Diagnosis not present

## 2024-01-31 NOTE — Telephone Encounter (Signed)
 Copied from CRM [id number]. Topic: Appointments - Appointment Info/Confirmation >> Jan 30, 2024  3:31 PM Selinda RAMAN wrote: The patient called in to confirm her physical appointment for next week and to add she would like to discuss a referral for a 2nd opinion to Cardiology. Please assist patient further

## 2024-02-02 NOTE — Telephone Encounter (Signed)
 Noted

## 2024-02-06 ENCOUNTER — Encounter: Admitting: Internal Medicine

## 2024-02-16 ENCOUNTER — Other Ambulatory Visit (HOSPITAL_COMMUNITY): Payer: Self-pay | Admitting: Internal Medicine

## 2024-02-20 ENCOUNTER — Ambulatory Visit: Admitting: Internal Medicine

## 2024-02-20 ENCOUNTER — Ambulatory Visit: Admitting: Cardiology

## 2024-02-22 ENCOUNTER — Encounter: Payer: Self-pay | Admitting: Internal Medicine

## 2024-02-22 ENCOUNTER — Telehealth: Admitting: Internal Medicine

## 2024-02-22 DIAGNOSIS — I5022 Chronic systolic (congestive) heart failure: Secondary | ICD-10-CM | POA: Diagnosis not present

## 2024-02-22 DIAGNOSIS — E559 Vitamin D deficiency, unspecified: Secondary | ICD-10-CM

## 2024-02-22 DIAGNOSIS — J029 Acute pharyngitis, unspecified: Secondary | ICD-10-CM | POA: Diagnosis not present

## 2024-02-22 DIAGNOSIS — E538 Deficiency of other specified B group vitamins: Secondary | ICD-10-CM

## 2024-02-22 MED ORDER — AZITHROMYCIN 250 MG PO TABS: ORAL_TABLET | ORAL | 1 refills | Status: AC

## 2024-02-22 NOTE — Discharge Instructions (Signed)
 Post Procedure Spinal Discharge Instruction Sheet  You may resume a regular diet and any medications that you routinely take (including pain medications) unless otherwise noted by MD.  No driving day of procedure.  Light activity throughout the rest of the day.  Do not do any strenuous work, exercise, bending or lifting.  The day following the procedure, you can resume normal physical activity but you should refrain from exercising or physical therapy for at least three days thereafter.  You may apply ice to the injection site, 20 minutes on, 20 minutes off, as needed. Do not apply ice directly to skin.    Common Side Effects:  Headaches- take your usual medications as directed by your physician.  Increase your fluid intake.  Caffeinated beverages may be helpful.  Lie flat in bed until your headache resolves.  Restlessness or inability to sleep- you may have trouble sleeping for the next few days.  Ask your referring physician if you need any medication for sleep.  Facial flushing or redness- should subside within a few days.  Increased pain- a temporary increase in pain a day or two following your procedure is not unusual.  Take your pain medication as prescribed by your referring physician.  Leg cramps  Please contact our office at [phone] for the following symptoms: Fever greater than 100 degrees. Headaches unresolved with medication after 2-3 days. Increased swelling, pain, or redness at injection site.   Thank you for visiting [facility] today.   **YOU MAY RESUME YOUR ELIQUIS  IN 24 HOURS**

## 2024-02-22 NOTE — Assessment & Plan Note (Signed)
 Last vitamin D  Lab Results  Component Value Date   VD25OH 45.31 05/27/2023   Stable, cont oral replacement

## 2024-02-22 NOTE — Assessment & Plan Note (Signed)
Mild to mod, for antibx course zpack,  to f/u any worsening symptoms or concerns 

## 2024-02-22 NOTE — Patient Instructions (Signed)
 Please take all new medication as prescribed - the antibiotic

## 2024-02-22 NOTE — Progress Notes (Signed)
 Patient ID: Christine Oneal, female   DOB: 21-Apr-1971, 53 y.o.   MRN: [id number]  Virtual Visit via Video Note  I connected with Christine Oneal on 02/22/24 at  3:40 PM EDT by a video enabled telemedicine application and verified that I am speaking with the correct person using two identifiers.  Location of all participants today Patient: at home Provider: at office   I discussed the limitations of evaluation and management by telemedicine and the availability of in person appointments. The patient expressed understanding and agreed to proceed.  History of Present Illness:  Here with 2-3 days acute onset fever, severe ST pain, pressure, headache, general weakness and malaise, and tonsils enlarged with neck discomfort, with mild ST and cough, but pt denies chest pain, wheezing, increased sob or doe, orthopnea, PND, increased LE swelling, palpitations, dizziness or syncope.   Pt denies polydipsia, polyuria, or new focal neuro s/s.    Past Medical History:  Diagnosis Date   Abnormal Pap smear of cervix    Allergy    Anemia    Arthritis    Atrial fibrillation (HCC)    CHF (congestive heart failure) (HCC)    Dysrhythmia    A fib   Family history of adverse reaction to anesthesia    Fibroid    History of atrial fibrillation    History of kidney stones    Hyperparathyroidism (HCC)    Hypertension    Kidney stones    Leukocytosis, unspecified 10/18/2013   Obesity    Plantar fasciitis    Pneumonia    Past Surgical History:  Procedure Laterality Date   ATRIAL FIBRILLATION ABLATION N/A 08/03/2022   Procedure: ATRIAL FIBRILLATION ABLATION;  Surgeon: Cindie Ole DASEN, MD;  Location: [facility]ASIVE [facility];  Service: Cardiovascular;  Laterality: N/A;   CARDIOVERSION N/A 05/12/2021   Procedure: CARDIOVERSION;  Surgeon: Cherrie Toribio JONELLE, MD;  Location: [facility] ENDOSCOPY;  Service: Cardiovascular;  Laterality: N/A;   CARDIOVERSION N/A 05/29/2021   Procedure: CARDIOVERSION;  Surgeon: Cherrie Toribio JONELLE, MD;  Location: [facility] ENDOSCOPY;  Service: Cardiovascular;  Laterality: N/A;   COLPOSCOPY     PARATHYROIDECTOMY N/A 01/17/2023   Procedure: NECK EXPLORATION WITH PARATHYROIDECTOMY;  Surgeon: Eletha Boas, MD;  Location: [facility] ORS;  Service: General;  Laterality: N/A;   RIGHT/LEFT HEART CATH AND CORONARY ANGIOGRAPHY N/A 05/11/2021   Procedure: RIGHT/LEFT HEART CATH AND CORONARY ANGIOGRAPHY;  Surgeon: Cherrie Toribio JONELLE, MD;  Location: [facility];  Service: Cardiovascular;  Laterality: N/A;   TEE WITHOUT CARDIOVERSION N/A 05/12/2021   Procedure: TRANSESOPHAGEAL ECHOCARDIOGRAM (TEE);  Surgeon: Cherrie Toribio JONELLE, MD;  Location: [facility] ENDOSCOPY;  Service: Cardiovascular;  Laterality: N/A;   TUBAL LIGATION  2001    reports that she has never smoked. She has never used smokeless tobacco. She reports that she does not drink alcohol and does not use drugs. family history includes Cervical cancer in her mother; Diabetes in her sister; Hypertension in her brother, brother, father, and mother; Stroke in her father; Uterine cancer in her maternal grandmother. Allergies  Allergen Reactions   Lisinopril-Hydrochlorothiazide Anaphylaxis    Angioedema   Current Outpatient Medications on File Prior to Visit  Medication Sig Dispense Refill   acetaminophen  (TYLENOL ) 500 MG tablet Take 1,000 mg by mouth every 6 (six) hours as needed for moderate pain or headache.     apixaban  (ELIQUIS ) 5 MG TABS tablet Take 1 tablet (5 mg total) by mouth 2 (two) times daily. 60 tablet 6   azelastine  (ASTELIN )  0.1 % nasal spray Place 1 spray into both nostrils 2 (two) times daily. Use in each nostril as directed 30 mL 12   carvedilol  (COREG ) 12.5 MG tablet Take 1 tablet (12.5 mg total) by mouth 2 (two) times daily. 180 tablet 1   Cholecalciferol  (VITAMIN D3) 25 MCG (1000 UT) CAPS Take 2,000 Units by mouth daily in the afternoon.     cyclobenzaprine  (FLEXERIL ) 10 MG tablet Take 0.5-1 tablets (5-10 mg total) by mouth 3  (three) times daily as needed for muscle spasms. 90 tablet 1   dextromethorphan-guaiFENesin  (MUCINEX  DM) 30-600 MG 12hr tablet Take 1 tablet by mouth 2 (two) times daily as needed (congestion).     FARXIGA  10 MG TABS tablet Take 1 tablet (10 mg total) by mouth daily. 90 tablet 3   fluticasone  (FLONASE ) 50 MCG/ACT nasal spray Place 2 sprays into both nostrils daily. 16 g 6   gabapentin  (NEURONTIN ) 100 MG capsule Take 1 capsule (100 mg total) by mouth at bedtime. 100-300mg  daily at bedtime 60 capsule 2   hydrocortisone  (ANUSOL -HC) 2.5 % rectal cream Place 1 Application rectally 2 (two) times daily. 30 g 11   lidocaine  (LIDODERM ) 5 % Place 1 patch onto the skin daily. Remove & Discard patch within 12 hours or as directed by MD 30 patch 0   losartan  (COZAAR ) 50 MG tablet TAKE 1 TABLET(50 MG) BY MOUTH IN THE MORNING AND AT BEDTIME 60 tablet 5   mirabegron  ER (MYRBETRIQ ) 50 MG TB24 tablet Take 1 tablet (50 mg total) by mouth daily. 90 tablet 1   nystatin  (MYCOSTATIN /NYSTOP ) powder Apply 1 Application topically 3 (three) times daily. 60 g 0   NYSTATIN  powder APPLY 1 APPLICATION TOPICALLY THREE TIMES DAILY 60 g 11   polyethylene glycol powder (GLYCOLAX /MIRALAX ) 17 GM/SCOOP powder Take 17 g by mouth 2 (two) times daily as needed. 3350 g 11   potassium chloride  SA (KLOR-CON  M20) 20 MEQ tablet Take 1 tablet (20 mEq total) by mouth daily. 90 tablet 3   Probiotic CHEW Chew 2 capsules by mouth daily.     promethazine -dextromethorphan (PROMETHAZINE -DM) 6.25-15 MG/5ML syrup Take 5 mLs by mouth 4 (four) times daily as needed. 118 mL 0   spironolactone  (ALDACTONE ) 25 MG tablet Take 1 tablet (25 mg total) by mouth daily. 90 tablet 3   torsemide  (DEMADEX ) 20 MG tablet Take 1 tablet by mouth as needed( when weight is up or notes swelling) 90 tablet 3   triamcinolone  cream (KENALOG ) 0.1 % Apply 1 Application topically 2 (two) times daily. 30 g 0   No current facility-administered medications on file prior to visit.     Observations/Objective: Alert, NAD, appropriate mood and affect, resps normal, cn 2-12 intact, moves all 4s, no visible rash or swelling Lab Results  Component Value Date   WBC 6.9 10/21/2023   HGB 11.4 (L) 10/21/2023   HCT 33.3 (L) 10/21/2023   PLT 402.0 (H) 10/21/2023   GLUCOSE 94 10/21/2023   CHOL 185 10/21/2023   TRIG 69.0 10/21/2023   HDL 49.00 10/21/2023   LDLCALC 122 (H) 10/21/2023   ALT 13 10/21/2023   AST 18 10/21/2023   NA 135 10/21/2023   K 4.4 10/21/2023   CL 101 10/21/2023   CREATININE 1.42 (H) 10/21/2023   BUN 32 (H) 10/21/2023   CO2 29 10/21/2023   TSH 2.050 08/25/2021   INR 1.2 05/05/2021   HGBA1C 5.1 10/21/2023   Assessment and Plan: See notes  Follow Up Instructions: See notes   I  discussed the assessment and treatment plan with the patient. The patient was provided an opportunity to ask questions and all were answered. The patient agreed with the plan and demonstrated an understanding of the instructions.   The patient was advised to call back or seek an in-person evaluation if the symptoms worsen or if the condition fails to improve as anticipated.   Lynwood Rush, MD

## 2024-02-22 NOTE — Assessment & Plan Note (Signed)
 Lab Results  Component Value Date   VITAMINB12 1,268 (H) 10/21/2023   Stable, cont oral replacement - b12 1000 mcg qd

## 2024-02-22 NOTE — Assessment & Plan Note (Signed)
 Exam limited by televisit nature, but appears to be euvolemic , cont current med tx

## 2024-02-23 ENCOUNTER — Inpatient Hospital Stay
Admission: RE | Admit: 2024-02-23 | Discharge: 2024-02-23 | Disposition: A | Discharge status: Home / Self Care | Source: Ambulatory Visit | Attending: Family Medicine | Admitting: Family Medicine

## 2024-02-27 ENCOUNTER — Ambulatory Visit: Admitting: Internal Medicine

## 2024-02-27 ENCOUNTER — Other Ambulatory Visit: Payer: Self-pay | Admitting: Internal Medicine

## 2024-02-27 ENCOUNTER — Other Ambulatory Visit (HOSPITAL_COMMUNITY): Payer: Self-pay | Admitting: Internal Medicine

## 2024-02-27 NOTE — Telephone Encounter (Signed)
 Prescription refill request for Christine Oneal  received. Indication: PAF Last office visit: 11/14/23  Christine Bergamo MD Scr: 1.42 on 10/21/23  Epic Age: 53 Weight: 111.6kg  Based on above findings Christine Oneal  5mg  twice daily is the appropriate dose.  Refill approved.

## 2024-02-29 NOTE — Discharge Instructions (Signed)
 Post Procedure Spinal Discharge Instruction Sheet  You may resume a regular diet and any medications that you routinely take (including pain medications) unless otherwise noted by MD.  No driving day of procedure.  Light activity throughout the rest of the day.  Do not do any strenuous work, exercise, bending or lifting.  The day following the procedure, you can resume normal physical activity but you should refrain from exercising or physical therapy for at least three days thereafter.  You may apply ice to the injection site, 20 minutes on, 20 minutes off, as needed. Do not apply ice directly to skin.    Common Side Effects:  Headaches- take your usual medications as directed by your physician.  Increase your fluid intake.  Caffeinated beverages may be helpful.  Lie flat in bed until your headache resolves.  Restlessness or inability to sleep- you may have trouble sleeping for the next few days.  Ask your referring physician if you need any medication for sleep.  Facial flushing or redness- should subside within a few days.  Increased pain- a temporary increase in pain a day or two following your procedure is not unusual.  Take your pain medication as prescribed by your referring physician.  Leg cramps  Please contact our office at [phone] for the following symptoms: Fever greater than 100 degrees. Headaches unresolved with medication after 2-3 days. Increased swelling, pain, or redness at injection site.   Thank you for visiting [facility] today.   **YOU MAY RESUME YOUR ELIQUIS  IN 24 HOURS**

## 2024-03-01 ENCOUNTER — Ambulatory Visit
Admission: RE | Admit: 2024-03-01 | Discharge: 2024-03-01 | Disposition: A | Discharge status: Home / Self Care | Source: Ambulatory Visit | Attending: Family Medicine | Admitting: Family Medicine

## 2024-03-01 DIAGNOSIS — M47812 Spondylosis without myelopathy or radiculopathy, cervical region: Secondary | ICD-10-CM | POA: Diagnosis not present

## 2024-03-01 DIAGNOSIS — M5412 Radiculopathy, cervical region: Secondary | ICD-10-CM

## 2024-03-01 MED ORDER — TRIAMCINOLONE ACETONIDE 40 MG/ML IJ SUSP (RADIOLOGY)
60.0000 mg | Freq: Once | INTRAMUSCULAR | Status: AC
  Administered 2024-03-01: 60 mg via EPIDURAL

## 2024-03-01 MED ORDER — IOPAMIDOL (ISOVUE-M 300) INJECTION 61%
1.0000 mL | Freq: Once | INTRAMUSCULAR | Status: AC | PRN
  Administered 2024-03-01: 1 mL via EPIDURAL

## 2024-03-02 ENCOUNTER — Ambulatory Visit: Admitting: Dietician

## 2024-03-03 DIAGNOSIS — I10 Essential (primary) hypertension: Secondary | ICD-10-CM | POA: Diagnosis not present

## 2024-03-03 DIAGNOSIS — I5022 Chronic systolic (congestive) heart failure: Secondary | ICD-10-CM | POA: Diagnosis not present

## 2024-03-03 DIAGNOSIS — R32 Unspecified urinary incontinence: Secondary | ICD-10-CM | POA: Diagnosis not present

## 2024-03-06 ENCOUNTER — Telehealth: Payer: Self-pay

## 2024-03-06 NOTE — Telephone Encounter (Signed)
 Copied from CRM #[id number]. Topic: General - Other >> Mar 06, 2024  8:44 AM Berneda FALCON wrote: Reason for CRM: Danielle from [facility] is calling to see if we received the order for the renewal of the patient's incontinence supply order. I did not see it in the chart. Patient runs out today so she wanted to see if we could please have PCP sign it and send back today if possible. Please be on the lookout-I did confirm she was sending it to the correct fax.

## 2024-03-06 NOTE — Telephone Encounter (Signed)
 As noted is separate encounter we cannot legally sign off without a recent visit. Patient was scheduled and no-showed for this so we cannot sign off without a visit to discuss.

## 2024-03-06 NOTE — Telephone Encounter (Signed)
 I have reached out to [employer] and informed that the patient was suppose to be seen shortly after the forms were sent in the first time but she ended up canceling her appt and this is why the provider has been unable to sign off for her supplies. [employer] verbalized they understood appreciated us  for the update and will reach out to patient as well as I to get her scheduled to see Dr Rollene.

## 2024-03-06 NOTE — Telephone Encounter (Signed)
 Noted and this message has been relayed back to the patient and informed her that she can address with Dr Rollene next week during her visit. I have also informed [employer] of this as well and stated that they will reach out to the patient but I have already did so and pt verbalized that she understood.

## 2024-03-06 NOTE — Telephone Encounter (Signed)
 Yes I have received forms as of today they were faxed in on 03/03/2024 Saturday. I have placed them inside providers box to review and sign

## 2024-03-11 ENCOUNTER — Other Ambulatory Visit: Payer: Self-pay | Admitting: Cardiology

## 2024-03-11 DIAGNOSIS — I10 Essential (primary) hypertension: Secondary | ICD-10-CM

## 2024-03-13 ENCOUNTER — Ambulatory Visit: Admitting: Internal Medicine

## 2024-03-13 VITALS — BP 116/82 | HR 73 | Temp 98.2°F | Ht 63.0 in | Wt 252.0 lb

## 2024-03-13 DIAGNOSIS — J3089 Other allergic rhinitis: Secondary | ICD-10-CM

## 2024-03-13 DIAGNOSIS — Z0001 Encounter for general adult medical examination with abnormal findings: Secondary | ICD-10-CM | POA: Diagnosis not present

## 2024-03-13 DIAGNOSIS — E210 Primary hyperparathyroidism: Secondary | ICD-10-CM | POA: Diagnosis not present

## 2024-03-13 DIAGNOSIS — Z23 Encounter for immunization: Secondary | ICD-10-CM | POA: Diagnosis not present

## 2024-03-13 DIAGNOSIS — Z1211 Encounter for screening for malignant neoplasm of colon: Secondary | ICD-10-CM

## 2024-03-13 DIAGNOSIS — N3946 Mixed incontinence: Secondary | ICD-10-CM | POA: Diagnosis not present

## 2024-03-13 DIAGNOSIS — N1832 Chronic kidney disease, stage 3b: Secondary | ICD-10-CM

## 2024-03-13 DIAGNOSIS — Z1231 Encounter for screening mammogram for malignant neoplasm of breast: Secondary | ICD-10-CM

## 2024-03-13 DIAGNOSIS — I5022 Chronic systolic (congestive) heart failure: Secondary | ICD-10-CM

## 2024-03-13 DIAGNOSIS — I10 Essential (primary) hypertension: Secondary | ICD-10-CM | POA: Diagnosis not present

## 2024-03-13 MED ORDER — FLUTICASONE PROPIONATE 50 MCG/ACT NA SUSP: 2.0000 | Freq: Every day | NASAL | 3 refills | Status: DC

## 2024-03-13 NOTE — Progress Notes (Unsigned)
 Subjective:   Patient ID: Christine Oneal, female    DOB: Feb 17, 1971, 53 y.o.   MRN: [id number]  Discussed the use of AI scribe software for clinical note transcription with the patient, who gave verbal consent to proceed. The patient is here for acute and also desires physical.  History of Present Illness Christine Oneal is a 53 year old female who presents with persistent sinus congestion and runny nose.  She has been experiencing ongoing sinus congestion and a runny nose, which have persisted despite a course of antibiotics prescribed a couple of weeks ago. The antibiotics provided some relief, but symptoms such as a runny nose and postnasal drainage continue. She also experiences a mildly sore throat. No fever, chills, sinus pain, or ear pain. She has been using Mucinex  for symptom relief but has run out of Flonase  nasal spray, which she found helpful in the past.  Regarding her cardiovascular history, her cardiologist stopped her isosorbide  and hydralazine  in May without communicating this to her. She has run out of imdur  and knew to stop hydralazine . She was frustrated due to not being able to get refills. She is currently taking carvedilol , spironolactone , torsemide , and losartan . She has not experienced any new chest pain, tightness, or pressure.  She reports occasional constipation, which she manages by resuming her previous regimen to maintain regularity. Her bladder function remains unchanged and has incontinence despite taking her medication for this.   Review of Systems  Constitutional: Negative.   HENT:  Positive for postnasal drip.   Eyes: Negative.   Respiratory:  Negative for cough, chest tightness and shortness of breath.   Cardiovascular:  Negative for chest pain, palpitations and leg swelling.  Gastrointestinal:  Negative for abdominal distention, abdominal pain, constipation, diarrhea, nausea and vomiting.  Genitourinary:  Positive for frequency and urgency.   Musculoskeletal:  Positive for arthralgias and myalgias.  Skin: Negative.   Neurological: Negative.   Psychiatric/Behavioral: Negative.      Objective:  Physical Exam Constitutional:      Appearance: She is well-developed. She is obese.  HENT:     Head: Normocephalic and atraumatic.  Cardiovascular:     Rate and Rhythm: Normal rate and regular rhythm.  Pulmonary:     Effort: Pulmonary effort is normal. No respiratory distress.     Breath sounds: Normal breath sounds. No wheezing or rales.  Abdominal:     General: Bowel sounds are normal. There is no distension.     Palpations: Abdomen is soft.     Tenderness: There is no abdominal tenderness. There is no rebound.  Musculoskeletal:     Cervical back: Normal range of motion.  Skin:    General: Skin is warm and dry.  Neurological:     Mental Status: She is alert and oriented to person, place, and time.     Coordination: Coordination normal.     Vitals:   03/13/24 0845  BP: 116/82  Pulse: 73  Temp: 98.2 F (36.8 C)  TempSrc: Oral  SpO2: 99%  Weight: 252 lb (114.3 kg)  Height: 5' 3 (1.6 m)    Assessment and Plan Assessment & Plan Upper respiratory symptoms with postnasal drip   Persistent rhinorrhea and sore throat are likely due to postnasal drip, possibly allergy-related. Previous antibiotics provided partial relief. Refill Flonase  nasal spray and advise using two sprays in each nostril daily.  Heart failure with reduced ejection fraction   Blood pressure is well-controlled. Isosorbide  and hydralazine  were discontinued due to hypotension. Current  medications include carvedilol , spironolactone , torsemide , and losartan . No new angina symptoms. Continue carvedilol , spironolactone , torsemide , and losartan . Do not restart isosorbide  or hydralazine .  Chronic kidney disease, stage 3b   Chronic kidney disease is at stage 3b. Recent antihypertensive changes necessitate re-evaluation of renal function. Check kidney function  tests post-medication changes.  Essential hypertension   Blood pressure is well-controlled with the current regimen. Previous medications were discontinued due to hypotension by cardiology and not explained to her. She had been still taking imdur  but had stopped hydralazine . She has been out of imdur  for several days and she may call cardiology to ensure this was correct. Reviewed note during visit and this is clearly stated in the note to stop. Continue current antihypertensive regimen: carvedilol , spironolactone , torsemide , and losartan .  Urinary incontinence   Urinary incontinence is stable with ongoing symptoms despite taking myrbetriq  daily. Continue myrbetriq . Complete Medicaid forms for incontinence supplies.

## 2024-03-13 NOTE — Patient Instructions (Signed)
 We have sent in the flonase  nose spray.

## 2024-03-14 DIAGNOSIS — J309 Allergic rhinitis, unspecified: Secondary | ICD-10-CM | POA: Insufficient documentation

## 2024-03-14 NOTE — Assessment & Plan Note (Signed)
 Blood pressure is well-controlled with the current regimen. Previous medications were discontinued due to hypotension by cardiology and not explained to her. She had been still taking imdur  but had stopped hydralazine . She has been out of imdur  for several days and she may call cardiology to ensure this was correct. Reviewed note during visit and this is clearly stated in the note to stop. Continue current antihypertensive regimen: carvedilol , spironolactone , torsemide , and losartan .

## 2024-03-14 NOTE — Assessment & Plan Note (Signed)
 Flu shot given. Pneumonia declines. Shingrix  complete. Tetanus up to date. Cologuard ordered. Mammogram ordered, pap smear counseled on frequency. Counseled about sun safety and mole surveillance. Counseled about the dangers of distracted driving. Given 10 year screening recommendations.

## 2024-03-14 NOTE — Assessment & Plan Note (Signed)
 Suspect post infectious drainage. No antibiotics are indicated. Rx flonase  and explained about usage. Can take for 1-2 weeks and then prn after. Counseled about upcoming fall allergies and she may want to continue use through fall.

## 2024-03-14 NOTE — Assessment & Plan Note (Signed)
 Checking CMP since BP meds were changed in May. Adjust as needed. BP well controlled.

## 2024-03-14 NOTE — Assessment & Plan Note (Signed)
 Blood pressure is well-controlled. Isosorbide  and hydralazine  were discontinued due to hypotension. Current medications include carvedilol , spironolactone , torsemide , and losartan . No new angina symptoms. Continue carvedilol , spironolactone , torsemide , and losartan . Do not restart isosorbide  or hydralazine .

## 2024-03-14 NOTE — Assessment & Plan Note (Signed)
 Urinary incontinence is stable with ongoing symptoms despite taking myrbetriq  daily. Continue myrbetriq . Complete Medicaid forms for incontinence supplies.

## 2024-03-14 NOTE — Assessment & Plan Note (Signed)
 Checking calcium and adjust as needed.

## 2024-03-16 ENCOUNTER — Ambulatory Visit: Admitting: Internal Medicine

## 2024-03-16 ENCOUNTER — Telehealth: Payer: Self-pay | Admitting: Radiology

## 2024-03-16 NOTE — Telephone Encounter (Signed)
 Forms to [employer] have been sent successfully

## 2024-03-16 NOTE — Telephone Encounter (Signed)
 I have recently filled this out and given it to you to fax with office note.

## 2024-03-16 NOTE — Telephone Encounter (Signed)
 Copied from CRM [id number]. Topic: General - Other >> Mar 16, 2024  9:07 AM Deleta RAMAN wrote: Reason for CRM: patient is calling to see if the form was sent over and filled out to [employer]. Please follow up with patient

## 2024-03-16 NOTE — Telephone Encounter (Signed)
 My apologies I though I had put an extra copy in providers box. Forms are placed in there now for provider to sign

## 2024-03-16 NOTE — Telephone Encounter (Signed)
 Yes that is correct was waiting in your note to be finished. I have printed notes and have fax back to [employer] [facility]

## 2024-03-23 DIAGNOSIS — Z1211 Encounter for screening for malignant neoplasm of colon: Secondary | ICD-10-CM | POA: Diagnosis not present

## 2024-03-29 ENCOUNTER — Ambulatory Visit: Payer: Self-pay | Admitting: Family

## 2024-03-29 LAB — COLOGUARD
COLOGUARD: NEGATIVE
Cologuard: NEGATIVE

## 2024-04-03 DIAGNOSIS — R32 Unspecified urinary incontinence: Secondary | ICD-10-CM | POA: Diagnosis not present

## 2024-04-03 DIAGNOSIS — I10 Essential (primary) hypertension: Secondary | ICD-10-CM | POA: Diagnosis not present

## 2024-04-03 DIAGNOSIS — I5022 Chronic systolic (congestive) heart failure: Secondary | ICD-10-CM | POA: Diagnosis not present

## 2024-04-09 ENCOUNTER — Ambulatory Visit: Admitting: Internal Medicine

## 2024-04-10 ENCOUNTER — Ambulatory Visit

## 2024-04-12 ENCOUNTER — Telehealth: Payer: Self-pay

## 2024-04-12 NOTE — Telephone Encounter (Signed)
 Sleep Study training

## 2024-04-12 NOTE — Telephone Encounter (Deleted)
 SABRA

## 2024-04-13 ENCOUNTER — Ambulatory Visit: Attending: Cardiology | Admitting: Cardiology

## 2024-04-13 ENCOUNTER — Ambulatory Visit: Admitting: Internal Medicine

## 2024-04-13 NOTE — Progress Notes (Deleted)
 Cardiology Office Note:  .   Date:  04/13/2024  ID:  Christine Oneal Balloon, DOB Jul 07, 1971, MRN [id number] PCP: Rollene Almarie LABOR, MD  [facility] Providers Cardiologist:  Gordy Bergamo, MD Electrophysiologist:  OLE ONEIDA HOLTS, MD { Click to update primary MD,subspecialty MD or APP then REFRESH:1}  History of Present Illness: .   Christine Oneal is a 53 y.o. 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  Has had a fairly turbulent course in 2022 with A-fib with RVR, LVEF <20% needing inotropic support, normal coronary arteries by cardiac catheterization, started on amiodarone  and underwent cardioversion on 05/29/2021 to sinus rhythm with improvement in EF to normal. Underwent atrial fibrillation ablation in January 2024. Amiodarone  discontinued on 11/12/2022.   Cardiac Studies relevent.    ECHOCARDIOGRAM COMPLETE 07/26/2022  1. Left ventricular ejection fraction, by estimation, is 55 to 60%. The left ventricle has normal function. The left ventricle has no regional wall motion abnormalities. There is mild left ventricular hypertrophy.  Left ventricular diastolic parameters are indeterminate. 2. Right ventricular systolic function is normal. The right ventricular size is normal.    Discussed the use of AI scribe software for clinical note transcription with the patient, who gave verbal consent to proceed.  History of Present Illness    Labs   Lab Results  Component Value Date   CHOL 185 10/21/2023   HDL 49.00 10/21/2023   LDLCALC 122 (H) 10/21/2023   TRIG 69.0 10/21/2023   CHOLHDL 4 10/21/2023   No results found for: LIPOA  Recent Labs    05/27/23 0941 10/21/23 0921  NA  --  135  K  --  4.4  CL  --  101  CO2  --  29  GLUCOSE  --  94  BUN  --  32*  CREATININE  --  1.42*  CALCIUM  9.3 9.5    Lab Results  Component Value Date   ALT 13 10/21/2023   AST 18 10/21/2023   ALKPHOS 83 10/21/2023   BILITOT 0.8 10/21/2023      Latest Ref Rng & Units 10/21/2023    9:21 AM 02/18/2023   10:54 AM 01/12/2023   10:33 AM  CBC  WBC 4.0 - 10.5 K/uL 6.9  6.9  5.4   Hemoglobin 12.0 - 15.0 g/dL 88.5  88.5  88.5   Hematocrit 36.0 - 46.0 % 33.3  34.5  34.5   Platelets 150.0 - 400.0 K/uL 402.0  365.0  298    Lab Results  Component Value Date   HGBA1C 5.1 10/21/2023    Lab Results  Component Value Date   TSH 2.050 08/25/2021   ROS  ***ROS Physical Exam:   VS:  There were no vitals taken for this visit.   Wt Readings from Last 3 Encounters:  03/13/24 252 lb (114.3 kg)  12/01/23 246 lb (111.6 kg)  11/14/23 246 lb (111.6 kg)    BP Readings from Last 3 Encounters:  03/13/24 116/82  03/01/24 (!) 122/57  11/14/23 94/66   ***Physical Exam EKG:       EKG 11/14/2023: Normal sinus rhythm at rate of 66 bpm, normal EKG.   ASSESSMENT AND PLAN: .    No diagnosis found.  Assessment and Plan Assessment & Plan   Follow up: ***  Signed,  Gordy Bergamo, MD, [facility] 04/13/2024, 8:15 AM [facility] [phone]  [address] [place], [place] [address] Phone: [phone]. Fax:  [phone]

## 2024-04-16 ENCOUNTER — Telehealth: Payer: Self-pay

## 2024-04-16 NOTE — Telephone Encounter (Signed)
**Note De-Identified [name] Obfuscation** Ordering provider: Jodie Passey, PA-c Associated diagnoses: OSA (obstructive sleep apnea) [G47.33]  3.   Patient notified of PIN (1234) on 04/16/2024 [name] Notification Method: phone.  Phone note routed to covering staff for follow-up.

## 2024-04-17 ENCOUNTER — Ambulatory Visit

## 2024-04-27 ENCOUNTER — Ambulatory Visit: Admitting: Internal Medicine

## 2024-05-04 ENCOUNTER — Encounter: Payer: Self-pay | Admitting: Internal Medicine

## 2024-05-04 ENCOUNTER — Ambulatory Visit: Admitting: Internal Medicine

## 2024-05-04 ENCOUNTER — Ambulatory Visit

## 2024-05-04 VITALS — BP 130/80 | HR 101 | Temp 98.1°F | Ht 63.0 in | Wt 261.6 lb

## 2024-05-04 DIAGNOSIS — M79671 Pain in right foot: Secondary | ICD-10-CM

## 2024-05-04 DIAGNOSIS — M7731 Calcaneal spur, right foot: Secondary | ICD-10-CM | POA: Diagnosis not present

## 2024-05-04 DIAGNOSIS — M5412 Radiculopathy, cervical region: Secondary | ICD-10-CM | POA: Diagnosis not present

## 2024-05-04 DIAGNOSIS — S92151D Displaced avulsion fracture (chip fracture) of right talus, subsequent encounter for fracture with routine healing: Secondary | ICD-10-CM | POA: Diagnosis not present

## 2024-05-04 NOTE — Assessment & Plan Note (Signed)
 Chronic neck pain is stable and well-managed, with no significant change post-fall.

## 2024-05-04 NOTE — Progress Notes (Signed)
 Subjective:   Patient ID: Christine Oneal, female    DOB: 1971-06-17, 53 y.o.   MRN: [id number]  Discussed the use of AI scribe software for clinical note transcription with the patient, who gave verbal consent to proceed.  History of Present Illness Christine Oneal is a 53 year old female who presents with right foot pain following a fall.  Approximately three to four weeks ago, she fell down the last two steps of a staircase, resulting in right foot pain. The pain is localized to a specific area on the foot and is exacerbated by twisting movements or accidental bumps. The foot remains red and bruised, and she experiences intense pain when it is bumped. She can feel a little bit of stiffness in her legs and that she still tries to bend down, but not like she used to.  She has a history of knee issues but states that her knees did not contribute to the fall. She describes the fall as a slip and twist, missing the last two steps. Since the fall, she has experienced stiffness in her legs and difficulty bending down as she used to.  For pain management, she occasionally takes Tylenol  and uses a muscle relaxer for her back pain. Her back pain has been aggravated since the fall, but her neck pain remains unchanged. She reports that she previously received an injection that helped her neck pain.  She reports a recent incident where her eye became blood red after soap got into it during a shower, attributing it to irritation from the soap. She is unsure if it is associated with high blood pressure or other systemic symptoms.  She experiences nausea when she does not eat regularly, especially when taking her medications on an empty stomach. She sometimes goes half a day without eating or drinking enough water , which contributes to her nausea. No new chest pain, tightness, or pressure, and her breathing is stable. She also denies any new stomach issues aside from nausea related to hunger. ; Review of  Systems  Constitutional: Negative.   HENT: Negative.    Eyes: Negative.   Respiratory:  Negative for cough, chest tightness and shortness of breath.   Cardiovascular:  Negative for chest pain, palpitations and leg swelling.  Gastrointestinal:  Negative for abdominal distention, abdominal pain, constipation, diarrhea, nausea and vomiting.  Musculoskeletal:  Positive for arthralgias, back pain and neck pain.  Skin: Negative.   Neurological: Negative.   Psychiatric/Behavioral: Negative.      Objective:  Physical Exam Constitutional:      Appearance: She is well-developed.  HENT:     Head: Normocephalic and atraumatic.  Cardiovascular:     Rate and Rhythm: Normal rate and regular rhythm.  Pulmonary:     Effort: Pulmonary effort is normal. No respiratory distress.     Breath sounds: Normal breath sounds. No wheezing or rales.  Abdominal:     General: Bowel sounds are normal. There is no distension.     Palpations: Abdomen is soft.     Tenderness: There is no abdominal tenderness.  Musculoskeletal:        General: Tenderness present.     Cervical back: Normal range of motion.     Comments: Right foot with pain midfoot medial  Skin:    General: Skin is warm and dry.  Neurological:     Mental Status: She is alert and oriented to person, place, and time.     Coordination: Coordination normal.  Vitals:   05/04/24 1018  BP: 130/80  Pulse: (!) 101  Temp: 98.1 F (36.7 C)  TempSrc: Oral  SpO2: 98%  Weight: 261 lb 9.6 oz (118.7 kg)  Height: 5' 3 (1.6 m)    Assessment and Plan Assessment & Plan Right foot pain after fall   Persistent right foot pain for 3-4 weeks post-fall, localized and worsened by twisting or impact. Differential includes soft tissue injury. Order X-ray of right foot to rule out fracture.  Back pain aggravated by recent fall   Chronic back pain exacerbated by recent fall, gradually improving. Muscle relaxants provide relief.  Neck pain, stable    Chronic neck pain is stable and well-managed, with no significant change post-fall.

## 2024-05-04 NOTE — Assessment & Plan Note (Signed)
 Persistent right foot pain for 3-4 weeks post-fall, localized and worsened by twisting or impact. Differential includes soft tissue injury. Order X-ray of right foot to rule out fracture.

## 2024-05-04 NOTE — Patient Instructions (Signed)
 We will check an x-ray of the right foot.

## 2024-05-06 ENCOUNTER — Other Ambulatory Visit: Payer: Self-pay | Admitting: Internal Medicine

## 2024-05-07 ENCOUNTER — Telehealth: Payer: Self-pay

## 2024-05-07 ENCOUNTER — Ambulatory Visit: Payer: Self-pay | Admitting: Internal Medicine

## 2024-05-07 ENCOUNTER — Other Ambulatory Visit: Payer: Self-pay | Admitting: Internal Medicine

## 2024-05-07 NOTE — Telephone Encounter (Unsigned)
 Copied from CRM [id number]. Topic: Clinical - Medication Refill >> May 07, 2024 12:56 PM Leah C wrote: Medication: polyethylene glycol powder (GLYCOLAX /MIRALAX ) 17 GM/SCOOP powder  Has the patient contacted their pharmacy? Pharmacy stated they are waiting for refill approval from Dr. Rollene.  (Agent: If no, request that the patient contact the pharmacy for the refill. If patient does not wish to contact the pharmacy document the reason why and proceed with request.) (Agent: If yes, when and what did the pharmacy advise?)  This is the patient's preferred pharmacy:  [id number] [place], [place] - [address] AT [facility] OF [facility] & [address] [place] [address] Phone: [phone] Fax: [phone]  Is this the correct pharmacy for this prescription? Yes If no, delete pharmacy and type the correct one.   Has the prescription been filled recently? Yes  Is the patient out of the medication? Yes  Has the patient been seen for an appointment in the last year OR does the patient have an upcoming appointment? Yes  Can we respond through MyChart? Yes  Agent: Please be advised that Rx refills may take up to 3 business days. We ask that you follow-up with your pharmacy.

## 2024-05-07 NOTE — Telephone Encounter (Signed)
 Ok to try

## 2024-05-07 NOTE — Telephone Encounter (Signed)
 Copied from CRM [id number]. Topic: Clinical - Medication Question >> May 07, 2024 12:58 PM Rea C wrote: Reason for CRM:  Patient stated that she purchased probiotic/prebiotic digestive enzymes and is wondering is it okay for her to take that?   [phone] (M)

## 2024-05-07 NOTE — Telephone Encounter (Signed)
 Ok to take

## 2024-05-07 NOTE — Telephone Encounter (Signed)
 Called pt and notified this is ok to take. Pt also wants to check w Dr. Rollene if the Ryze Mushroom coffee is also ok for her to use?

## 2024-05-07 NOTE — Telephone Encounter (Signed)
 Called and notified pt

## 2024-05-09 ENCOUNTER — Ambulatory Visit

## 2024-05-16 ENCOUNTER — Ambulatory Visit: Admitting: Family Medicine

## 2024-05-16 ENCOUNTER — Encounter: Payer: Self-pay | Admitting: Family Medicine

## 2024-05-16 ENCOUNTER — Other Ambulatory Visit: Payer: Self-pay

## 2024-05-16 ENCOUNTER — Ambulatory Visit (INDEPENDENT_AMBULATORY_CARE_PROVIDER_SITE_OTHER)

## 2024-05-16 VITALS — BP 112/80 | HR 80 | Ht 63.0 in | Wt 264.0 lb

## 2024-05-16 DIAGNOSIS — M546 Pain in thoracic spine: Secondary | ICD-10-CM

## 2024-05-16 DIAGNOSIS — M549 Dorsalgia, unspecified: Secondary | ICD-10-CM | POA: Diagnosis not present

## 2024-05-16 DIAGNOSIS — M25511 Pain in right shoulder: Secondary | ICD-10-CM

## 2024-05-16 DIAGNOSIS — M19019 Primary osteoarthritis, unspecified shoulder: Secondary | ICD-10-CM | POA: Diagnosis not present

## 2024-05-16 DIAGNOSIS — M47814 Spondylosis without myelopathy or radiculopathy, thoracic region: Secondary | ICD-10-CM | POA: Diagnosis not present

## 2024-05-16 DIAGNOSIS — M25512 Pain in left shoulder: Secondary | ICD-10-CM

## 2024-05-16 DIAGNOSIS — G8929 Other chronic pain: Secondary | ICD-10-CM

## 2024-05-16 NOTE — Progress Notes (Signed)
   I, Leotis Batter, CMA acting as a scribe for Artist Lloyd, MD.  Christine Oneal is a 53 y.o. female who presents to [facility] today for bilat shoulder pain. Pt was previously seen by Dr. Lloyd on 10/21/23 for cervical radiculitis.  Today, pt c/o bilat shoulder pain x 3-4 weeks. Pt locates pain to posterior aspect of the shoulder occasionally radiating into the arms and neck. Denies n/t/w. ROM well maintained.   Radiates: arm, neck Aggravates: repetitive lifting, cold weather Treatments tried: Tylenol , Cyclobenzaprine   Pt also c/o mid/low back pain, worsening since fall about 1.5 months ago.   Pertinent review of systems: No fevers or chills  Relevant historical information: Heart failure.  CKD.   Exam:  BP 112/80   Pulse 80   Ht 5' 3 (1.6 m)   Wt 264 lb (119.7 kg)   SpO2 100%   BMI 46.77 kg/m  General: Well Developed, well nourished, and in no acute distress.   MSK: C-spine: Normal-appearing Nontender palpation spinal midline.  Normal cervical motion.  Upper extremity strength and reflexes are intact.  T-spine normal-appearing Nontender palpation midline.  Tender palpation paraspinal musculature.    Lab and Radiology Results  X-ray images T-spine and left shoulder obtained today personally and independently interpreted.  T-spine: Multilevel degenerative changes.  No acute fractures are visible.  Left shoulder: No acute fractures no severe degenerative changes.  Await formal radiology review    Assessment and Plan: 53 y.o. female with left shoulder and thoracic perispinal pain due to muscle dysfunction.  Plan for physical therapy.  Recheck in 2 months or sooner if needed.   PDMP not reviewed this encounter. Orders Placed This Encounter  Procedures   US  LIMITED JOINT SPACE STRUCTURES UP BILAT(NO LINKED CHARGES)    Reason for Exam (SYMPTOM  OR DIAGNOSIS REQUIRED):   bilat shoulder pain    Preferred imaging location?:    [facility] Shoulder Left    Standing Status:   Future    Number of Occurrences:   1    Expiration Date:   06/15/2024    Reason for Exam (SYMPTOM  OR DIAGNOSIS REQUIRED):   left shoulder pain    Preferred imaging location?:   [facility]    Is patient pregnant?:   No   DG Thoracic Spine W/Swimmers    Standing Status:   Future    Number of Occurrences:   1    Expiration Date:   05/16/2025    Reason for Exam (SYMPTOM  OR DIAGNOSIS REQUIRED):   thoracic back pain    Preferred imaging location?:   [facility]    Is patient pregnant?:   No   Ambulatory referral to Physical Therapy    Referral Priority:   Routine    Referral Type:   Physical Medicine    Referral Reason:   Specialty Services Required    Requested Specialty:   Physical Therapy    Number of Visits Requested:   1   No orders of the defined types were placed in this encounter.    Discussed warning signs or symptoms. Please see discharge instructions. Patient expresses understanding.   The above documentation has been reviewed and is accurate and complete Artist Lloyd, M.D.

## 2024-05-16 NOTE — Patient Instructions (Addendum)
 Thank you for coming in today.   Please get an Xray today before you leave   A referral for physical therapy has been submitted. A representative from the physical therapy office will contact you to coordinate scheduling after confirming your benefits with your insurance provider. If you do not hear from the physical therapy office within the next 1-2 weeks, please let us  know.   See you back in 2 months

## 2024-05-17 DIAGNOSIS — I5022 Chronic systolic (congestive) heart failure: Secondary | ICD-10-CM | POA: Diagnosis not present

## 2024-05-17 DIAGNOSIS — I10 Essential (primary) hypertension: Secondary | ICD-10-CM | POA: Diagnosis not present

## 2024-05-17 DIAGNOSIS — R32 Unspecified urinary incontinence: Secondary | ICD-10-CM | POA: Diagnosis not present

## 2024-05-21 ENCOUNTER — Ambulatory Visit: Payer: Self-pay | Admitting: Family Medicine

## 2024-05-21 NOTE — Progress Notes (Signed)
 Left shoulder x-ray has just a little bit of arthritis of the small joint at the top of the shoulder.  The main shoulder joint looks okay.

## 2024-05-21 NOTE — Progress Notes (Signed)
 Thoracic spine x-ray shows some bone spurs and some arthritis.

## 2024-05-30 ENCOUNTER — Ambulatory Visit
Admission: RE | Admit: 2024-05-30 | Discharge: 2024-05-30 | Disposition: A | Source: Ambulatory Visit | Attending: Internal Medicine | Admitting: Internal Medicine

## 2024-05-30 DIAGNOSIS — Z1231 Encounter for screening mammogram for malignant neoplasm of breast: Secondary | ICD-10-CM

## 2024-05-30 NOTE — Therapy (Signed)
 OUTPATIENT PHYSICAL THERAPY UPPER EXTREMITY EVALUATION   Patient Name: Christine Oneal MRN: [id number] DOB:September 04, 1970, 53 y.o., female Today's Date: 05/31/2024  END OF SESSION:  PT End of Session - 05/31/24 0841     Visit Number 1    Number of Visits 13    Date for Recertification  07/26/24    Authorization Type [place] medicaid prepaid    PT Start Time [id number]    PT Stop Time 0917    PT Time Calculation (min) 35 min    Activity Tolerance Patient tolerated treatment well    Behavior During Therapy [facility] for tasks assessed/performed          Past Medical History:  Diagnosis Date   Abnormal Pap smear of cervix    Allergy    Anemia    Arthritis    Atrial fibrillation (HCC)    CHF (congestive heart failure) (HCC)    Dysrhythmia    A fib   Family history of adverse reaction to anesthesia    Fibroid    History of atrial fibrillation    History of kidney stones    Hyperparathyroidism    Hypertension    Kidney stones    Leukocytosis, unspecified 10/18/2013   Obesity    Plantar fasciitis    Pneumonia    Past Surgical History:  Procedure Laterality Date   ATRIAL FIBRILLATION ABLATION N/A 08/03/2022   Procedure: ATRIAL FIBRILLATION ABLATION;  Surgeon: Cindie Ole DASEN, MD;  Location: [facility] INVASIVE CV LAB;  Service: Cardiovascular;  Laterality: N/A;   CARDIOVERSION N/A 05/12/2021   Procedure: CARDIOVERSION;  Surgeon: Cherrie Toribio JONELLE, MD;  Location: [facility] ENDOSCOPY;  Service: Cardiovascular;  Laterality: N/A;   CARDIOVERSION N/A 05/29/2021   Procedure: CARDIOVERSION;  Surgeon: Cherrie Toribio JONELLE, MD;  Location: [facility] ENDOSCOPY;  Service: Cardiovascular;  Laterality: N/A;   COLPOSCOPY     PARATHYROIDECTOMY N/A 01/17/2023   Procedure: NECK EXPLORATION WITH PARATHYROIDECTOMY;  Surgeon: Eletha Boas, MD;  Location: [facility] ORS;  Service: General;  Laterality: N/A;   RIGHT/LEFT HEART CATH AND CORONARY ANGIOGRAPHY N/A 05/11/2021   Procedure: RIGHT/LEFT HEART CATH AND CORONARY ANGIOGRAPHY;   Surgeon: Cherrie Toribio JONELLE, MD;  Location: [facility] INVASIVE CV LAB;  Service: Cardiovascular;  Laterality: N/A;   TEE WITHOUT CARDIOVERSION N/A 05/12/2021   Procedure: TRANSESOPHAGEAL ECHOCARDIOGRAM (TEE);  Surgeon: Cherrie Toribio JONELLE, MD;  Location: [facility] ENDOSCOPY;  Service: Cardiovascular;  Laterality: N/A;   TUBAL LIGATION  2001   Patient Active Problem List   Diagnosis Date Noted   Cervical radiculopathy, chronic 05/04/2024   Allergic rhinitis 03/14/2024   B12 deficiency 03/02/2023   Stage 3b chronic kidney disease (CKD) (HCC) 03/02/2023   Constipation 03/02/2023   Right foot pain 12/09/2022   Urinary incontinence 08/23/2022   Chronic pain of both knees 08/23/2022   Gait instability 08/23/2022   Dysmenorrhea 05/14/2022   Left foot pain 05/14/2022   Encounter for general adult medical examination with abnormal findings 04/17/2022   Primary hyperparathyroidism 11/25/2021   Chronic systolic CHF (congestive heart failure) (HCC) 05/26/2021   Unspecified atrial fibrillation (HCC) 05/04/2021   Anemia 09/04/2017   Morbid obesity (HCC) 09/03/2017   Reactive airway disease 09/03/2017   Vitamin D  deficiency 09/03/2017   Essential hypertension 10/24/2007    PCP: Almarie Cleveland, MD  REFERRING PROVIDER: Joane Artist RAMAN, MD  REFERRING DIAG:  Diagnosis  M25.511,G89.29,M25.512 (ICD-10-CM) - Chronic pain of both shoulders  M54.6,G89.29 (ICD-10-CM) - Chronic bilateral thoracic back pain    THERAPY DIAG:  Pain in thoracic spine  Cervicalgia  Chronic right shoulder pain  Chronic left shoulder pain  Rationale for Evaluation and Treatment: Rehabilitation  ONSET DATE: RUE x2 months, LUE   SUBJECTIVE:                                                                                                                                                                                      SUBJECTIVE STATEMENT: Pt states that she has been having chronic pain in both shoulders, R shoulder pain has  been present for 2 months, wihtout MOI. LUE has been present similar time frame. Presence of arthritis complicates symptoms. [place] about 1 month ago when she was walking down steps, landed on hands and knees, no head contact. Pt noted pain in Bil LE, R foot lateral border, pt states fx present and instructed to allow healing and modify activity, no formal restrictions. Thoracic spine pain inc with fall. Over the last 2 weeks, symptoms are overall worsening with symptoms reaching from the back to the front of the shoulder (bil). She reports symptoms range in intensity from 1/10-8/10 depending on her activity or position as well as time of day.  Hand dominance: Right  PERTINENT HISTORY: Evaluate and Treat for bilat shoulder pain, mid/low back pain 1-2 times per week for 4-6 weeks.   Decrease pain, increase strength, flexibility, function, and range of motion.  Modalities may include, traction, ionto, phono, stim, and dry needling prn.  PAIN:  Are you having pain? Yes: NPRS scale: LUE 3/10, RUE 4/10, 3/10 thoracic spine Pain location: R shoulder, L shoulder, mid thoracic spine Pain description: sche, tight, sore Aggravating factors: reaching, lifting Relieving factors: rest  PRECAUTIONS: None  RED FLAGS: None   WEIGHT BEARING RESTRICTIONS: No  FALLS:  Has patient fallen in last 6 months? Yes. Number of falls 1  LIVING ENVIRONMENT: Lives with: lives with their family Lives in: House/apartment Stairs: Yes: External: 2 steps; on left going up Has following equipment at home: None  OCCUPATION: Not working   PLOF: Independent  PATIENT GOALS: to be able to back to normal   NEXT MD VISIT: 06/01/24  OBJECTIVE:  Note: Objective measures were completed at Evaluation unless otherwise noted.  DIAGNOSTIC FINDINGS:  Thoracic spine 05/16/24: Degenerative change without acute abnormality.  L shoulder 05/16/24: No acute fracture or dislocation is noted. Underlying bony thorax appears within  normal limits. No soft tissue abnormality is seen. Mild degenerative changes of the acromioclavicular joint are noted.  IMPRESSION: No acute abnormality noted.   PATIENT SURVEYS :  NDI:  NECK DISABILITY INDEX  Date: 05/31/24 Score  Pain intensity 2 = The pain is moderate at the moment  2. Personal care (  washing, dressing, etc.) 1 =  I can look after myself normally but it causes extra pain  3. Lifting 4 =  I can only lift very light weights  4. Reading 0 = I can read as much as I want to with no pain in my neck  5. Headaches 1 =  I have slight headaches, which come infrequently  6. Concentration 0 =  I can concentrate fully when I want to with no difficulty  7. Work 2 = I can do most of my usual work, but no more  8. Driving 1 =  I can drive my car as long as I want with slight pain in my neck  9. Sleeping 2 = My sleep is mildly disturbed (1-2 hrs sleepless)  10. Recreation 4 =  I can hardly do any recreation activities because of pain in my neck  Total 17/50   Minimum Detectable Change (90% confidence): 5 points or 10% points  COGNITION: Overall cognitive status: Within functional limits for tasks assessed     SENSATION: WFL  POSTURE: Forward head, rounded shoulders, inc lumbar lordosis, inc thoracic kyphosis  CERVICAL SPINE ROM:  05/31/24 Flexion: nil loss, tightness in upper c/s Extension: nil loss, R side c/s inc pain Side bend R: nil loss, L sided stretch Side bend L: 25% loss, R sided pull, L sided c/s pain Rot R:  25% loss, R mid c/s Rot L:  nil loss, upper L c/s pain  UPPER EXTREMITY ROM:   Active ROM Right eval Left eval  Shoulder flexion [facility] WFL  Shoulder extension [facility]  Shoulder abduction [facility]  Shoulder adduction    Shoulder functional internal rotation L2 P in R shoulder T9 P in L shoulder  Shoulder functional external rotation Mid clavicle P in R arm to mid brachium Mid clavicle P in L shoulder  Elbow flexion    Elbow extension    Wrist flexion     Wrist extension    Wrist ulnar deviation    Wrist radial deviation    Wrist pronation    Wrist supination    (Blank rows = not tested)  UPPER EXTREMITY MMT:  MMT Right eval Left eval  Shoulder flexion 4+/5 4+/5  Shoulder extension 4+/5 4+/5  Shoulder abduction 4/5 4/5  Shoulder adduction 4+/5 P in R shoulder 4+/5 P in L shoulder  Shoulder internal rotation 4+/5 4+/5  Shoulder external rotation 4/5 4/5  Middle trapezius    Lower trapezius    Elbow flexion    Elbow extension    Wrist flexion    Wrist extension    Wrist ulnar deviation    Wrist radial deviation    Wrist pronation    Wrist supination    Grip strength (lbs)    (Blank rows = not tested)   PALPATION:  Inc TTP and resting tension of bilateral upper traps, rhomboids, and levator scap  TREATMENT DATE:  [facility] Adult PT Treatment:                                                DATE: 05/31/24 Self Care: Pt educated on PT POC, relevant anatomy, physiology, pathology, diagnosis, prognosis, progression of care, pain and activity modification related to symptoms   PATIENT EDUCATION: Education details: Pt educated on relevant anatomy, physiology, pathology, diagnosis, prognosis, progression of care, pain and activity modification related to upper quarter pain Person educated: Patient Education method: Explanation, Demonstration, and Handouts Education comprehension: verbalized understanding and returned demonstration  HOME EXERCISE PROGRAM: To be established  ASSESSMENT:  CLINICAL IMPRESSION: Patient is a 53 y.o. F who was seen today for physical therapy evaluation and treatment for bil shoulder pain, cervical spine pain, and thoracic spine pain. Pt symptoms likely have a cervical component due to degree of bil UE ROM and lack of symptoms, symmetry, and the presence of thoracic spine pain.  Pt is not currently working but is limited in her day to day activities, especially with UE use under weight and repetitive tasks. Symptoms in the cervical spine are consistent with decreased tolerance to facet closing moment L>R.  Pt stands to benefit from skilled physical therapy to address deficit areas and restore safety and improve tolerance with activities and participations at home and in the community.      OBJECTIVE IMPAIRMENTS: decreased activity tolerance, decreased ROM, decreased strength, increased fascial restrictions, impaired perceived functional ability, increased muscle spasms, impaired UE functional use, obesity, and pain.   ACTIVITY LIMITATIONS: carrying, lifting, sleeping, and reach over head  PARTICIPATION LIMITATIONS: meal prep, cleaning, laundry, community activity, and yard work  PERSONAL FACTORS: Time since onset of injury/illness/exacerbation and 3+ comorbidities: obesity, anemia, arthritis are also affecting patient's functional outcome.   REHAB POTENTIAL: Good  CLINICAL DECISION MAKING: Stable/uncomplicated  EVALUATION COMPLEXITY: Low  GOALS: Goals reviewed with patient? Yes  SHORT TERM GOALS: Target date: 06/30/24   Pt will report compliance with HEP to work towards ind and home management strategies Baseline: Goal status: INITIAL   2.  Pt will score no greater than 7/50 on NDI to demonstrate improved activity tolerance Baseline: 17/50 Goal status: INITIAL   3.  Pt will improve cervical and shoulder AROM to full and painless in order to demonstrate progress towards activity tolerance and improved function Baseline: see ROM chart Goal status: INITIAL     LONG TERM GOALS: Target date: 07/26/24   Pt will score no greater than 0/50 on NDI to demonstrate improved activity tolerance Baseline: 17/50 Goal status: INITIAL   2.  Pt will report no greater than 0/10 pain over 7 consecutive days to demonstrate maintained reduction in symptoms and improved  tolerance to activity Baseline:  Goal status: INITIAL   3.  Pt will be ind in the management of their symptoms at home and in the community Baseline:  Goal status: INITIAL     PLAN: PT FREQUENCY: 1-2x/week  PT DURATION: 8 weeks  PLANNED INTERVENTIONS: 97110-Therapeutic exercises, 97530- Therapeutic activity, [id number]- Neuromuscular re-education, 97535- Self Care, 02859- Manual therapy, G0283- Electrical stimulation (unattended), 20560 (1-2 muscles), 20561 (3+ muscles)- Dry Needling, Patient/Family education, Cryotherapy, and Moist heat  PLAN FOR NEXT SESSION: Upper quarter strength, stability, endurance, mobility, motor control through functional; movement patterns, assess repeated end range movements to cervical spine, shoulders, and or thoracic spine  as indicated, establish and modify HEP as indicated   Stann DELENA Ohara, PT 05/31/2024, 10:46 AM

## 2024-05-31 ENCOUNTER — Ambulatory Visit: Attending: Family Medicine | Admitting: Physical Therapy

## 2024-05-31 ENCOUNTER — Encounter: Payer: Self-pay | Admitting: Physical Therapy

## 2024-05-31 ENCOUNTER — Other Ambulatory Visit: Payer: Self-pay

## 2024-05-31 DIAGNOSIS — M542 Cervicalgia: Secondary | ICD-10-CM | POA: Diagnosis present

## 2024-05-31 DIAGNOSIS — G8929 Other chronic pain: Secondary | ICD-10-CM | POA: Diagnosis present

## 2024-05-31 DIAGNOSIS — M546 Pain in thoracic spine: Secondary | ICD-10-CM | POA: Diagnosis present

## 2024-05-31 DIAGNOSIS — M25511 Pain in right shoulder: Secondary | ICD-10-CM | POA: Insufficient documentation

## 2024-05-31 DIAGNOSIS — M25512 Pain in left shoulder: Secondary | ICD-10-CM | POA: Insufficient documentation

## 2024-06-01 ENCOUNTER — Ambulatory Visit: Admitting: Internal Medicine

## 2024-06-01 ENCOUNTER — Ambulatory Visit (INDEPENDENT_AMBULATORY_CARE_PROVIDER_SITE_OTHER)

## 2024-06-01 ENCOUNTER — Ambulatory Visit: Payer: Self-pay | Admitting: Internal Medicine

## 2024-06-01 VITALS — BP 116/70 | HR 67 | Temp 98.3°F | Ht 63.0 in | Wt 260.0 lb

## 2024-06-01 DIAGNOSIS — M79641 Pain in right hand: Secondary | ICD-10-CM

## 2024-06-01 DIAGNOSIS — M79643 Pain in unspecified hand: Secondary | ICD-10-CM | POA: Diagnosis not present

## 2024-06-01 NOTE — Patient Instructions (Addendum)
    Have a xray of your hand downstairs.      Medications changes include :   None

## 2024-06-01 NOTE — Progress Notes (Signed)
 Subjective:    Patient ID: Christine Oneal, female    DOB: 05/13/1971, 53 y.o.   MRN: [id number]      HPI Luca is here for  Chief Complaint  Patient presents with   Hand Pain    Right hand pain   Arm Pain    Bilateral arm pain L>R     Discussed the use of AI scribe software for clinical note transcription with the patient, who gave verbal consent to proceed.  History of Present Illness Christine Oneal is a 53 year old female who presents with right hand pain.  She began experiencing sudden and severe pain in her right hand a few days ago while waiting in line to pick up her grandson. The pain primarily affects the palm and the middle three fingers, is intermittent, and improves with rest. She has not taken any medication for the pain, hoping it would resolve on its own.  She has a history of similar pain in her left hand, which she attributes to arthritis. The pain in her left hand occurs with use and is described as aching. The severe pain in her right hand has eased somewhat but remains present. No numbness or tingling in her hands, but she reports soreness and occasional weakness, though she maintains a decent grip.  She experiences chronic bilateral arm pain, describing her arms as 'so sore' from the shoulders down. This pain is present every day and has been ongoing for months. She has seen a specialist for her shoulder, neck, and back pain, and reports that x-rays were performed. She is starting physical therapy for her neck and back issues.  She takes a muscle relaxer, Flexeril , for her neck and back pain, which provides some relief. She does not use heat or ice for her symptoms. She fell last month, injuring her arm, but states that the current hand pain is new and not related to the fall. She describes the pain as being exacerbated by use and pressure on the hand.  For her bilateral arm and shoulder pain she has seen Dr. Joane for this recently.  X-rays were done.  She  was referred for physical therapy.  It was thought that her bilateral arm pain was related to her neck.    Medications and allergies reviewed with patient and updated if appropriate.  Current Outpatient Medications on File Prior to Visit  Medication Sig Dispense Refill   acetaminophen  (TYLENOL ) 500 MG tablet Take 1,000 mg by mouth every 6 (six) hours as needed for moderate pain or headache.     apixaban  (ELIQUIS ) 5 MG TABS tablet TAKE 1 TABLET(5 MG) BY MOUTH TWICE DAILY 60 tablet 5   azelastine  (ASTELIN ) 0.1 % nasal spray Place 1 spray into both nostrils 2 (two) times daily. Use in each nostril as directed 30 mL 12   carvedilol  (COREG ) 12.5 MG tablet TAKE 1 TABLET(12.5 MG) BY MOUTH TWICE DAILY 180 tablet 1   Cholecalciferol  (VITAMIN D3) 25 MCG (1000 UT) CAPS Take 2,000 Units by mouth daily in the afternoon.     cyclobenzaprine  (FLEXERIL ) 10 MG tablet Take 0.5-1 tablets (5-10 mg total) by mouth 3 (three) times daily as needed for muscle spasms. 90 tablet 1   dextromethorphan-guaiFENesin  (MUCINEX  DM) 30-600 MG 12hr tablet Take 1 tablet by mouth 2 (two) times daily as needed (congestion).     FARXIGA  10 MG TABS tablet Take 1 tablet (10 mg total) by mouth daily. 90 tablet 3   fluticasone  (FLONASE )  50 MCG/ACT nasal spray Place 2 sprays into both nostrils daily. 48 g 3   gabapentin  (NEURONTIN ) 100 MG capsule Take 1 capsule (100 mg total) by mouth at bedtime. 100-300mg  daily at bedtime 60 capsule 2   hydrocortisone  (ANUSOL -HC) 2.5 % rectal cream Place 1 Application rectally 2 (two) times daily. 30 g 11   lidocaine  (LIDODERM ) 5 % Place 1 patch onto the skin daily. Remove & Discard patch within 12 hours or as directed by MD 30 patch 0   losartan  (COZAAR ) 50 MG tablet TAKE 1 TABLET(50 MG) BY MOUTH IN THE MORNING AND AT BEDTIME 60 tablet 5   nystatin  (MYCOSTATIN /NYSTOP ) powder Apply 1 Application topically 3 (three) times daily. 60 g 0   NYSTATIN  powder APPLY 1 APPLICATION TOPICALLY THREE TIMES DAILY 60 g  11   polyethylene glycol powder (GLYCOLAX /MIRALAX ) 17 GM/SCOOP powder TAKE 17 GRAMS AS DIRECTED TWICE DAILY AS NEEDED 510 g 0   potassium chloride  SA (KLOR-CON  M20) 20 MEQ tablet Take 1 tablet (20 mEq total) by mouth daily. 90 tablet 3   Probiotic CHEW Chew 2 capsules by mouth daily.     spironolactone  (ALDACTONE ) 25 MG tablet Take 1 tablet (25 mg total) by mouth daily. 90 tablet 3   torsemide  (DEMADEX ) 20 MG tablet Take 1 tablet by mouth as needed( when weight is up or notes swelling) 90 tablet 3   triamcinolone  cream (KENALOG ) 0.1 % Apply 1 Application topically 2 (two) times daily. 30 g 0   No current facility-administered medications on file prior to visit.    Review of Systems     Objective:   Vitals:   06/01/24 0954  BP: 116/70  Pulse: 67  Temp: 98.3 F (36.8 C)  SpO2: 99%   BP Readings from Last 3 Encounters:  06/01/24 116/70  05/16/24 112/80  05/04/24 130/80   Wt Readings from Last 3 Encounters:  06/01/24 260 lb (117.9 kg)  05/16/24 264 lb (119.7 kg)  05/04/24 261 lb 9.6 oz (118.7 kg)   Body mass index is 46.06 kg/m.    Physical Exam Constitutional:      General: She is not in acute distress.    Appearance: Normal appearance. She is not ill-appearing.  HENT:     Head: Normocephalic and atraumatic.  Musculoskeletal:        General: Tenderness (No tenderness with palpation of right palm, thenar surface or fingers.  She states mild soreness) present. No swelling, deformity or signs of injury. Normal range of motion.  Skin:    General: Skin is warm and dry.     Findings: No erythema or rash.  Neurological:     Mental Status: She is alert.        DG Thoracic Spine W/Swimmers CLINICAL DATA:  Back pain for 1 month following fall, initial encounter  EXAM: DG THORACIC SPINE 3V  COMPARISON:  05/18/2023  FINDINGS: Vertebral body height is well maintained. Multilevel osteophytic changes are seen. No paraspinal mass is noted. Pedicles are within normal  limits. Visualized ribcage is within normal limits.  IMPRESSION: Degenerative change without acute abnormality.  Electronically Signed   By: Oneil Devonshire M.D.   On: 05/21/2024 00:39 DG Shoulder Left CLINICAL DATA:  Left shoulder pain for 1 month following fall, initial encounter  EXAM: DG SHOULDER 2+V*L*  COMPARISON:  None Available.  FINDINGS: No acute fracture or dislocation is noted. Underlying bony thorax appears within normal limits. No soft tissue abnormality is seen. Mild degenerative changes of the acromioclavicular joint are  noted.  IMPRESSION: No acute abnormality noted.  Electronically Signed   By: Oneil Devonshire M.D.   On: 05/21/2024 00:32      Assessment & Plan:    See Problem List for Assessment and Plan of chronic medical problems.    Assessment and Plan Assessment & Plan Right hand pain and chronic bilateral hand pain Acute right hand pain with chronic bilateral hand pain, primarily affecting the middle three fingers. Differential includes arthritis, tendinitis, or referred pain from the neck or back. Previous similar episodes in the left hand suggest arthritis.  Pain has improved without treatment - Ordered x-ray of the right hand to rule out arthritis, which I do not think is the case. - Continue physical therapy for neck and back as prescribed by Dr. Joane.  See if this improves the hand pain at all - Discuss findings with Dr. Joane after physical therapy to determine further management.  Chronic neck pain Chronic neck pain, possibly contributing to arm pain. Previous imaging showed arthritis in the mid-spine. - Continue physical therapy for neck pain. - Consider trial of gabapentin  for nerve pain if indicated-she has a medication at home, but has not taken it.  Chronic mid back pain Chronic mid back pain, possibly related to arthritis. Pain may be contributing to arm pain. - Continue physical therapy for mid back pain. - Consider trial of  gabapentin  for nerve pain if indicated-has a medication at home, but has not taken it. - Continue Flexeril  at night which seems to be helping.  Chronic bilateral shoulder pain Chronic bilateral shoulder pain, possibly related to neck issues. Previous imaging showed arthritis in the mid-spine. - Continue physical therapy for shoulder pain. - Consider trial of gabapentin  for nerve pain

## 2024-06-03 ENCOUNTER — Other Ambulatory Visit: Payer: Self-pay | Admitting: Internal Medicine

## 2024-06-05 DIAGNOSIS — Z7901 Long term (current) use of anticoagulants: Secondary | ICD-10-CM | POA: Diagnosis not present

## 2024-06-05 DIAGNOSIS — N951 Menopausal and female climacteric states: Secondary | ICD-10-CM | POA: Diagnosis not present

## 2024-06-05 DIAGNOSIS — Z6841 Body Mass Index (BMI) 40.0 and over, adult: Secondary | ICD-10-CM | POA: Diagnosis not present

## 2024-06-05 DIAGNOSIS — Z1329 Encounter for screening for other suspected endocrine disorder: Secondary | ICD-10-CM | POA: Diagnosis not present

## 2024-06-05 DIAGNOSIS — Z131 Encounter for screening for diabetes mellitus: Secondary | ICD-10-CM | POA: Diagnosis not present

## 2024-06-05 DIAGNOSIS — R635 Abnormal weight gain: Secondary | ICD-10-CM | POA: Diagnosis not present

## 2024-06-05 DIAGNOSIS — I10 Essential (primary) hypertension: Secondary | ICD-10-CM | POA: Diagnosis not present

## 2024-06-05 DIAGNOSIS — E785 Hyperlipidemia, unspecified: Secondary | ICD-10-CM | POA: Diagnosis not present

## 2024-06-05 DIAGNOSIS — E559 Vitamin D deficiency, unspecified: Secondary | ICD-10-CM | POA: Diagnosis not present

## 2024-06-05 DIAGNOSIS — Z130 Encounter for screening for diseases of the blood and blood-forming organs and certain disorders involving the immune mechanism: Secondary | ICD-10-CM | POA: Diagnosis not present

## 2024-06-05 DIAGNOSIS — E7800 Pure hypercholesterolemia, unspecified: Secondary | ICD-10-CM | POA: Diagnosis not present

## 2024-06-12 DIAGNOSIS — I10 Essential (primary) hypertension: Secondary | ICD-10-CM | POA: Diagnosis not present

## 2024-06-12 DIAGNOSIS — Z8679 Personal history of other diseases of the circulatory system: Secondary | ICD-10-CM | POA: Diagnosis not present

## 2024-06-12 DIAGNOSIS — I4891 Unspecified atrial fibrillation: Secondary | ICD-10-CM | POA: Diagnosis not present

## 2024-06-12 DIAGNOSIS — N951 Menopausal and female climacteric states: Secondary | ICD-10-CM | POA: Diagnosis not present

## 2024-06-12 DIAGNOSIS — E7800 Pure hypercholesterolemia, unspecified: Secondary | ICD-10-CM | POA: Diagnosis not present

## 2024-06-12 DIAGNOSIS — N289 Disorder of kidney and ureter, unspecified: Secondary | ICD-10-CM | POA: Diagnosis not present

## 2024-06-12 DIAGNOSIS — R7989 Other specified abnormal findings of blood chemistry: Secondary | ICD-10-CM | POA: Diagnosis not present

## 2024-06-12 DIAGNOSIS — R635 Abnormal weight gain: Secondary | ICD-10-CM | POA: Diagnosis not present

## 2024-06-12 DIAGNOSIS — E559 Vitamin D deficiency, unspecified: Secondary | ICD-10-CM | POA: Diagnosis not present

## 2024-06-12 DIAGNOSIS — Z6841 Body Mass Index (BMI) 40.0 and over, adult: Secondary | ICD-10-CM | POA: Diagnosis not present

## 2024-06-12 DIAGNOSIS — N952 Postmenopausal atrophic vaginitis: Secondary | ICD-10-CM | POA: Diagnosis not present

## 2024-06-13 ENCOUNTER — Ambulatory Visit

## 2024-06-15 ENCOUNTER — Ambulatory Visit: Attending: Family Medicine

## 2024-06-15 DIAGNOSIS — M6281 Muscle weakness (generalized): Secondary | ICD-10-CM | POA: Diagnosis present

## 2024-06-15 DIAGNOSIS — G8929 Other chronic pain: Secondary | ICD-10-CM | POA: Diagnosis present

## 2024-06-15 DIAGNOSIS — M25511 Pain in right shoulder: Secondary | ICD-10-CM | POA: Insufficient documentation

## 2024-06-15 DIAGNOSIS — M25512 Pain in left shoulder: Secondary | ICD-10-CM | POA: Insufficient documentation

## 2024-06-15 DIAGNOSIS — M542 Cervicalgia: Secondary | ICD-10-CM | POA: Insufficient documentation

## 2024-06-15 DIAGNOSIS — M546 Pain in thoracic spine: Secondary | ICD-10-CM | POA: Diagnosis present

## 2024-06-15 NOTE — Therapy (Signed)
 OUTPATIENT PHYSICAL THERAPY UPPER EXTREMITY EVALUATION   Patient Name: Christine Oneal MRN: [id number] DOB:[birth year], 53 y.o., female Today's Date: 06/15/2024  END OF SESSION:  PT End of Session - 06/15/24 1225     Visit Number 2    Number of Visits 13    Date for Recertification  07/26/24    Authorization Type [place] prepaid    Authorization - Visit Number 2    Authorization - Number of Visits 5    PT Start Time 1225    PT Stop Time 1255    PT Time Calculation (min) 30 min    Activity Tolerance Patient tolerated treatment well    Behavior During Therapy WFL for tasks assessed/performed           Past Medical History:  Diagnosis Date   Abnormal Pap smear of cervix    Allergy    Anemia    Arthritis    Atrial fibrillation (HCC)    CHF (congestive heart failure) (HCC)    Dysrhythmia    A fib   Family history of adverse reaction to anesthesia    Fibroid    History of atrial fibrillation    History of kidney stones    Hyperparathyroidism    Hypertension    Kidney stones    Leukocytosis, unspecified 10/18/2013   Obesity    Plantar fasciitis    Pneumonia    Past Surgical History:  Procedure Laterality Date   ATRIAL FIBRILLATION ABLATION N/A 08/03/2022   Procedure: ATRIAL FIBRILLATION ABLATION;  Surgeon: Cindie Ole DASEN, MD;  Location: [facility];  Service: Cardiovascular;  Laterality: N/A;   CARDIOVERSION N/A 05/12/2021   Procedure: CARDIOVERSION;  Surgeon: Cherrie Toribio JONELLE, MD;  Location: [facility] ENDOSCOPY;  Service: Cardiovascular;  Laterality: N/A;   CARDIOVERSION N/A 05/29/2021   Procedure: CARDIOVERSION;  Surgeon: Cherrie Toribio JONELLE, MD;  Location: [facility] ENDOSCOPY;  Service: Cardiovascular;  Laterality: N/A;   COLPOSCOPY     PARATHYROIDECTOMY N/A 01/17/2023   Procedure: NECK EXPLORATION WITH PARATHYROIDECTOMY;  Surgeon: Eletha Boas, MD;  Location: [facility] ORS;  Service: General;  Laterality: N/A;   RIGHT/LEFT HEART CATH AND CORONARY ANGIOGRAPHY N/A  05/11/2021   Procedure: RIGHT/LEFT HEART CATH AND CORONARY ANGIOGRAPHY;  Surgeon: Cherrie Toribio JONELLE, MD;  Location: [facility];  Service: Cardiovascular;  Laterality: N/A;   TEE WITHOUT CARDIOVERSION N/A 05/12/2021   Procedure: TRANSESOPHAGEAL ECHOCARDIOGRAM (TEE);  Surgeon: Cherrie Toribio JONELLE, MD;  Location: [facility] ENDOSCOPY;  Service: Cardiovascular;  Laterality: N/A;   TUBAL LIGATION  2001   Patient Active Problem List   Diagnosis Date Noted   Cervical radiculopathy, chronic 05/04/2024   Allergic rhinitis 03/14/2024   B12 deficiency 03/02/2023   Stage 3b chronic kidney disease (CKD) (HCC) 03/02/2023   Constipation 03/02/2023   Right foot pain 12/09/2022   Urinary incontinence 08/23/2022   Chronic pain of both knees 08/23/2022   Gait instability 08/23/2022   Dysmenorrhea 05/14/2022   Left foot pain 05/14/2022   Encounter for general adult medical examination with abnormal findings 04/17/2022   Primary hyperparathyroidism 11/25/2021   Chronic systolic CHF (congestive heart failure) (HCC) 05/26/2021   Unspecified atrial fibrillation (HCC) 05/04/2021   Anemia 09/04/2017   Morbid obesity (HCC) 09/03/2017   Reactive airway disease 09/03/2017   Vitamin D  deficiency 09/03/2017   Essential hypertension 10/24/2007    PCP: Almarie Cleveland, MD  REFERRING PROVIDER: Joane Artist RAMAN, MD  REFERRING DIAG:  Diagnosis  M25.511,G89.29,M25.512 (ICD-10-CM) - Chronic pain of both shoulders  M54.6,G89.29 (ICD-10-CM) - Chronic bilateral thoracic back pain    THERAPY DIAG:  Pain in thoracic spine  Cervicalgia  Chronic right shoulder pain  Chronic left shoulder pain  Muscle weakness (generalized)  Rationale for Evaluation and Treatment: Rehabilitation  ONSET DATE: RUE x2 months, LUE   SUBJECTIVE:                                                                                                                                                                                       SUBJECTIVE STATEMENT: 06/15/2024 Pt reports minimal pain upon arrival today. She has not done too much activity yet today.   EVAL Pt states that she has been having chronic pain in both shoulders, R shoulder pain has been present for 2 months, wihtout MOI. LUE has been present similar time frame. Presence of arthritis complicates symptoms. [place] about 1 month ago when she was walking down steps, landed on hands and knees, no head contact. Pt noted pain in Bil LE, R foot lateral border, pt states fx present and instructed to allow healing and modify activity, no formal restrictions. Thoracic spine pain inc with fall. Over the last 2 weeks, symptoms are overall worsening with symptoms reaching from the back to the front of the shoulder (bil). She reports symptoms range in intensity from 1/10-8/10 depending on her activity or position as well as time of day.  Hand dominance: Right  PERTINENT HISTORY: Evaluate and Treat for bilat shoulder pain, mid/low back pain 1-2 times per week for 4-6 weeks.   Decrease pain, increase strength, flexibility, function, and range of motion.  Modalities may include, traction, ionto, phono, stim, and dry needling prn.  PAIN:  Are you having pain? Yes: NPRS scale: LUE 3/10, RUE 4/10, 3/10 thoracic spine Pain location: R shoulder, L shoulder, mid thoracic spine Pain description: sche, tight, sore Aggravating factors: reaching, lifting Relieving factors: rest  PRECAUTIONS: None  RED FLAGS: None   WEIGHT BEARING RESTRICTIONS: No  FALLS:  Has patient fallen in last 6 months? Yes. Number of falls 1  LIVING ENVIRONMENT: Lives with: lives with their family Lives in: House/apartment Stairs: Yes: External: 2 steps; on left going up Has following equipment at home: None  OCCUPATION: Not working   PLOF: Independent  PATIENT GOALS: to be able to back to normal   NEXT MD VISIT: 06/01/24  OBJECTIVE:  Note: Objective measures were completed at Evaluation  unless otherwise noted.  DIAGNOSTIC FINDINGS:  Thoracic spine 05/16/24: Degenerative change without acute abnormality.  L shoulder 05/16/24: No acute fracture or dislocation is noted. Underlying bony thorax appears within normal limits. No soft tissue abnormality is seen. Mild degenerative changes  of the acromioclavicular joint are noted.  IMPRESSION: No acute abnormality noted.   PATIENT SURVEYS :  NDI:  NECK DISABILITY INDEX  Date: 05/31/24 Score  Pain intensity 2 = The pain is moderate at the moment  2. Personal care (washing, dressing, etc.) 1 =  I can look after myself normally but it causes extra pain  3. Lifting 4 =  I can only lift very light weights  4. Reading 0 = I can read as much as I want to with no pain in my neck  5. Headaches 1 =  I have slight headaches, which come infrequently  6. Concentration 0 =  I can concentrate fully when I want to with no difficulty  7. Work 2 = I can do most of my usual work, but no more  8. Driving 1 =  I can drive my car as long as I want with slight pain in my neck  9. Sleeping 2 = My sleep is mildly disturbed (1-2 hrs sleepless)  10. Recreation 4 =  I can hardly do any recreation activities because of pain in my neck  Total 17/50   Minimum Detectable Change (90% confidence): 5 points or 10% points  COGNITION: Overall cognitive status: Within functional limits for tasks assessed     SENSATION: WFL  POSTURE: Forward head, rounded shoulders, inc lumbar lordosis, inc thoracic kyphosis  CERVICAL SPINE ROM:  05/31/24 Flexion: nil loss, tightness in upper c/s Extension: nil loss, R side c/s inc pain Side bend R: nil loss, L sided stretch Side bend L: 25% loss, R sided pull, L sided c/s pain Rot R:  25% loss, R mid c/s Rot L:  nil loss, upper L c/s pain  UPPER EXTREMITY ROM:   Active ROM Right eval Left eval  Shoulder flexion [facility] WFL  Shoulder extension [facility]  Shoulder abduction [facility]  Shoulder adduction    Shoulder  functional internal rotation L2 P in R shoulder T9 P in L shoulder  Shoulder functional external rotation Mid clavicle P in R arm to mid brachium Mid clavicle P in L shoulder  Elbow flexion    Elbow extension    Wrist flexion    Wrist extension    Wrist ulnar deviation    Wrist radial deviation    Wrist pronation    Wrist supination    (Blank rows = not tested)  UPPER EXTREMITY MMT:  MMT Right eval Left eval  Shoulder flexion 4+/5 4+/5  Shoulder extension 4+/5 4+/5  Shoulder abduction 4/5 4/5  Shoulder adduction 4+/5 P in R shoulder 4+/5 P in L shoulder  Shoulder internal rotation 4+/5 4+/5  Shoulder external rotation 4/5 4/5  Middle trapezius    Lower trapezius    Elbow flexion    Elbow extension    Wrist flexion    Wrist extension    Wrist ulnar deviation    Wrist radial deviation    Wrist pronation    Wrist supination    Grip strength (lbs)    (Blank rows = not tested)   PALPATION:  Inc TTP and resting tension of bilateral upper traps, rhomboids, and levator scap  TREATMENT DATE:  [facility] Adult PT Treatment:                                                DATE: 06/15/2024   Therapeutic Exercise Seated dowel flexion x 10  Seated B ER YTB x 10  Seated horizontal ABD YTB x 10  Seated bicep curl YTB x 15 B  Fwd and lateral ball rolls x 10 each  Rows GTB 2x10 Shoulder ext RTB 2x10  Standing chest press BTB 2x10  [facility] Adult PT Treatment:                                                DATE: 05/31/24 Self Care: Pt educated on PT POC, relevant anatomy, physiology, pathology, diagnosis, prognosis, progression of care, pain and activity modification related to symptoms   PATIENT EDUCATION: Education details: Pt educated on relevant anatomy, physiology, pathology, diagnosis, prognosis, progression of care, pain and activity modification related to  upper quarter pain Person educated: Patient Education method: Explanation, Demonstration, and Handouts Education comprehension: verbalized understanding and returned demonstration  HOME EXERCISE PROGRAM: Access Code: [email or web address] URL: [email or web address] Date: 06/15/2024 Prepared by: Marijo Berber  Exercises - Standing 'L' Stretch at Counter  - 1 x daily - 7 x weekly - 1 sets - 3 reps - 30sec hold - Standing Shoulder Row with Anchored Resistance  - 1 x daily - 7 x weekly - 3 sets - 10 reps - Shoulder extension with resistance - Neutral  - 1 x daily - 7 x weekly - 3 sets - 10 reps - Standing Shoulder Horizontal Abduction with Resistance  - 1 x daily - 7 x weekly - 3 sets - 10 reps  ASSESSMENT:  CLINICAL IMPRESSION: 06/15/2024  Pt was guided through gentle strengthening and stretching exercises today. She noted pain with B ER, it was discontinued. Pt able to complete all other exercises without exacerbation of pain. She was given a HEP today reflecting some exercises performed in clinic. The pt will benefit from skilled physical therapy to decrease pain and increase function.    EVAL Patient is a 53 y.o. F who was seen today for physical therapy evaluation and treatment for bil shoulder pain, cervical spine pain, and thoracic spine pain. Pt symptoms likely have a cervical component due to degree of bil UE ROM and lack of symptoms, symmetry, and the presence of thoracic spine pain. Pt is not currently working but is limited in her day to day activities, especially with UE use under weight and repetitive tasks. Symptoms in the cervical spine are consistent with decreased tolerance to facet closing moment L>R.  Pt stands to benefit from skilled physical therapy to address deficit areas and restore safety and improve tolerance with activities and participations at home and in the community.    OBJECTIVE IMPAIRMENTS: decreased activity tolerance, decreased ROM, decreased strength,  increased fascial restrictions, impaired perceived functional ability, increased muscle spasms, impaired UE functional use, obesity, and pain.   ACTIVITY LIMITATIONS: carrying, lifting, sleeping, and reach over head  PARTICIPATION LIMITATIONS: meal prep, cleaning, laundry, community activity, and yard work  PERSONAL FACTORS: Time since onset of injury/illness/exacerbation and 3+ comorbidities: obesity, anemia, arthritis are also affecting patient's functional outcome.  REHAB POTENTIAL: Good  CLINICAL DECISION MAKING: Stable/uncomplicated  EVALUATION COMPLEXITY: Low  GOALS: Goals reviewed with patient? Yes  SHORT TERM GOALS: Target date: 06/30/24   Pt will report compliance with HEP to work towards ind and home management strategies Baseline: Goal status: INITIAL   2.  Pt will score no greater than 7/50 on NDI to demonstrate improved activity tolerance Baseline: 17/50 Goal status: INITIAL   3.  Pt will improve cervical and shoulder AROM to full and painless in order to demonstrate progress towards activity tolerance and improved function Baseline: see ROM chart Goal status: INITIAL     LONG TERM GOALS: Target date: 07/26/24   Pt will score no greater than 0/50 on NDI to demonstrate improved activity tolerance Baseline: 17/50 Goal status: INITIAL   2.  Pt will report no greater than 0/10 pain over 7 consecutive days to demonstrate maintained reduction in symptoms and improved tolerance to activity Baseline:  Goal status: INITIAL   3.  Pt will be ind in the management of their symptoms at home and in the community Baseline:  Goal status: INITIAL     PLAN: PT FREQUENCY: 1-2x/week  PT DURATION: 8 weeks  PLANNED INTERVENTIONS: 97110-Therapeutic exercises, 97530- Therapeutic activity, 97112- Neuromuscular re-education, 97535- Self Care, [id number]- Manual therapy, [id number]- Electrical stimulation (unattended), 20560 (1-2 muscles), 20561 (3+ muscles)- Dry Needling, Patient/Family  education, Cryotherapy, and Moist heat  PLAN FOR NEXT SESSION: Upper quarter strength, stability, endurance, mobility, motor control through functional; movement patterns, assess repeated end range movements to cervical spine, shoulders, and or thoracic spine as indicated, establish and modify HEP as indicated   Marijo DELENA Berber, PT 06/15/2024, 12:47 PM

## 2024-06-20 ENCOUNTER — Ambulatory Visit

## 2024-06-20 DIAGNOSIS — M6281 Muscle weakness (generalized): Secondary | ICD-10-CM

## 2024-06-20 DIAGNOSIS — M546 Pain in thoracic spine: Secondary | ICD-10-CM

## 2024-06-20 DIAGNOSIS — G8929 Other chronic pain: Secondary | ICD-10-CM

## 2024-06-20 DIAGNOSIS — M542 Cervicalgia: Secondary | ICD-10-CM

## 2024-06-20 NOTE — Therapy (Signed)
 OUTPATIENT PHYSICAL THERAPY UPPER EXTREMITY TREATMENT   Patient Name: Christine Oneal MRN: [id number] DOB:[birth year], 53 y.o., female Today's Date: 06/20/2024  END OF SESSION:  PT End of Session - 06/20/24 0828     Visit Number 3    Number of Visits 13    Date for Recertification  07/26/24    Authorization Type [place] medicaid [place]    Authorization - Visit Number 3    Authorization - Number of Visits 5    PT Start Time 0830    PT Stop Time 0910    PT Time Calculation (min) 40 min    Activity Tolerance Patient tolerated treatment well    Behavior During Therapy [facility] for tasks assessed/performed         Past Medical History:  Diagnosis Date   Abnormal Pap smear of cervix    Allergy    Anemia    Arthritis    Atrial fibrillation (HCC)    CHF (congestive heart failure) (HCC)    Dysrhythmia    A fib   Family history of adverse reaction to anesthesia    Fibroid    History of atrial fibrillation    History of kidney stones    Hyperparathyroidism    Hypertension    Kidney stones    Leukocytosis, unspecified 10/18/2013   Obesity    Plantar fasciitis    Pneumonia    Past Surgical History:  Procedure Laterality Date   ATRIAL FIBRILLATION ABLATION N/A 08/03/2022   Procedure: ATRIAL FIBRILLATION ABLATION;  Surgeon: Cindie Ole DASEN, MD;  Location: [facility] INVASIVE CV LAB;  Service: Cardiovascular;  Laterality: N/A;   CARDIOVERSION N/A 05/12/2021   Procedure: CARDIOVERSION;  Surgeon: Cherrie Toribio JONELLE, MD;  Location: [facility] ENDOSCOPY;  Service: Cardiovascular;  Laterality: N/A;   CARDIOVERSION N/A 05/29/2021   Procedure: CARDIOVERSION;  Surgeon: Cherrie Toribio JONELLE, MD;  Location: [facility] ENDOSCOPY;  Service: Cardiovascular;  Laterality: N/A;   COLPOSCOPY     PARATHYROIDECTOMY N/A 01/17/2023   Procedure: NECK EXPLORATION WITH PARATHYROIDECTOMY;  Surgeon: Eletha Boas, MD;  Location: [facility] ORS;  Service: General;  Laterality: N/A;   RIGHT/LEFT HEART CATH AND CORONARY ANGIOGRAPHY N/A  05/11/2021   Procedure: RIGHT/LEFT HEART CATH AND CORONARY ANGIOGRAPHY;  Surgeon: Cherrie Toribio JONELLE, MD;  Location: [facility] INVASIVE CV LAB;  Service: Cardiovascular;  Laterality: N/A;   TEE WITHOUT CARDIOVERSION N/A 05/12/2021   Procedure: TRANSESOPHAGEAL ECHOCARDIOGRAM (TEE);  Surgeon: Cherrie Toribio JONELLE, MD;  Location: [facility] ENDOSCOPY;  Service: Cardiovascular;  Laterality: N/A;   TUBAL LIGATION  2001   Patient Active Problem List   Diagnosis Date Noted   Cervical radiculopathy, chronic 05/04/2024   Allergic rhinitis 03/14/2024   B12 deficiency 03/02/2023   Stage 3b chronic kidney disease (CKD) (HCC) 03/02/2023   Constipation 03/02/2023   Right foot pain 12/09/2022   Urinary incontinence 08/23/2022   Chronic pain of both knees 08/23/2022   Gait instability 08/23/2022   Dysmenorrhea 05/14/2022   Left foot pain 05/14/2022   Encounter for general adult medical examination with abnormal findings 04/17/2022   Primary hyperparathyroidism 11/25/2021   Chronic systolic CHF (congestive heart failure) (HCC) 05/26/2021   Unspecified atrial fibrillation (HCC) 05/04/2021   Anemia 09/04/2017   Morbid obesity (HCC) 09/03/2017   Reactive airway disease 09/03/2017   Vitamin D  deficiency 09/03/2017   Essential hypertension 10/24/2007    PCP: Almarie Cleveland, MD  REFERRING PROVIDER: Joane Artist RAMAN, MD  REFERRING DIAG:  Diagnosis  M25.511,G89.29,M25.512 (ICD-10-CM) - Chronic pain of both shoulders  M54.6,G89.29 (ICD-10-CM) -  Chronic bilateral thoracic back pain    THERAPY DIAG:  Cervicalgia  Chronic right shoulder pain  Chronic left shoulder pain  Muscle weakness (generalized)  Rationale for Evaluation and Treatment: Rehabilitation  ONSET DATE: RUE x2 months, LUE   SUBJECTIVE:                                                                                                                                                                                      SUBJECTIVE  STATEMENT: Patient reports that she is feel okay today, states that she's having mild pain and after last session she felt okay. Compliant with HEP.  EVAL Pt states that she has been having chronic pain in both shoulders, R shoulder pain has been present for 2 months, wihtout MOI. LUE has been present similar time frame. Presence of arthritis complicates symptoms. [place] about 1 month ago when she was walking down steps, landed on hands and knees, no head contact. Pt noted pain in Bil LE, R foot lateral border, pt states fx present and instructed to allow healing and modify activity, no formal restrictions. Thoracic spine pain inc with fall. Over the last 2 weeks, symptoms are overall worsening with symptoms reaching from the back to the front of the shoulder (bil). She reports symptoms range in intensity from 1/10-8/10 depending on her activity or position as well as time of day.  Hand dominance: Right  PERTINENT HISTORY: Evaluate and Treat for bilat shoulder pain, mid/low back pain 1-2 times per week for 4-6 weeks.   Decrease pain, increase strength, flexibility, function, and range of motion.  Modalities may include, traction, ionto, phono, stim, and dry needling prn.  PAIN:  Are you having pain? Yes: NPRS scale: LUE 3/10, RUE 4/10, 3/10 thoracic spine Pain location: R shoulder, L shoulder, mid thoracic spine Pain description: sche, tight, sore Aggravating factors: reaching, lifting Relieving factors: rest  PRECAUTIONS: None  RED FLAGS: None   WEIGHT BEARING RESTRICTIONS: No  FALLS:  Has patient fallen in last 6 months? Yes. Number of falls 1  LIVING ENVIRONMENT: Lives with: lives with their family Lives in: House/apartment Stairs: Yes: External: 2 steps; on left going up Has following equipment at home: None  OCCUPATION: Not working   PLOF: Independent  PATIENT GOALS: to be able to back to normal   NEXT MD VISIT: 06/01/24  OBJECTIVE:  Note: Objective measures were  completed at Evaluation unless otherwise noted.  DIAGNOSTIC FINDINGS:  Thoracic spine 05/16/24: Degenerative change without acute abnormality.  L shoulder 05/16/24: No acute fracture or dislocation is noted. Underlying bony thorax appears within normal limits. No soft tissue abnormality is seen. Mild degenerative changes of the  acromioclavicular joint are noted.  IMPRESSION: No acute abnormality noted.   PATIENT SURVEYS :  NDI:  NECK DISABILITY INDEX  Date: 05/31/24 Score  Pain intensity 2 = The pain is moderate at the moment  2. Personal care (washing, dressing, etc.) 1 =  I can look after myself normally but it causes extra pain  3. Lifting 4 =  I can only lift very light weights  4. Reading 0 = I can read as much as I want to with no pain in my neck  5. Headaches 1 =  I have slight headaches, which come infrequently  6. Concentration 0 =  I can concentrate fully when I want to with no difficulty  7. Work 2 = I can do most of my usual work, but no more  8. Driving 1 =  I can drive my car as long as I want with slight pain in my neck  9. Sleeping 2 = My sleep is mildly disturbed (1-2 hrs sleepless)  10. Recreation 4 =  I can hardly do any recreation activities because of pain in my neck  Total 17/50   Minimum Detectable Change (90% confidence): 5 points or 10% points  COGNITION: Overall cognitive status: Within functional limits for tasks assessed     SENSATION: WFL  POSTURE: Forward head, rounded shoulders, inc lumbar lordosis, inc thoracic kyphosis  CERVICAL SPINE ROM:  05/31/24 Flexion: nil loss, tightness in upper c/s Extension: nil loss, R side c/s inc pain Side bend R: nil loss, L sided stretch Side bend L: 25% loss, R sided pull, L sided c/s pain Rot R:  25% loss, R mid c/s Rot L:  nil loss, upper L c/s pain  UPPER EXTREMITY ROM:   Active ROM Right eval Left eval  Shoulder flexion [facility] WFL  Shoulder extension [facility]  Shoulder abduction [facility]  Shoulder  adduction    Shoulder functional internal rotation L2 P in R shoulder T9 P in L shoulder  Shoulder functional external rotation Mid clavicle P in R arm to mid brachium Mid clavicle P in L shoulder  Elbow flexion    Elbow extension    Wrist flexion    Wrist extension    Wrist ulnar deviation    Wrist radial deviation    Wrist pronation    Wrist supination    (Blank rows = not tested)  UPPER EXTREMITY MMT:  MMT Right eval Left eval  Shoulder flexion 4+/5 4+/5  Shoulder extension 4+/5 4+/5  Shoulder abduction 4/5 4/5  Shoulder adduction 4+/5 P in R shoulder 4+/5 P in L shoulder  Shoulder internal rotation 4+/5 4+/5  Shoulder external rotation 4/5 4/5  Middle trapezius    Lower trapezius    Elbow flexion    Elbow extension    Wrist flexion    Wrist extension    Wrist ulnar deviation    Wrist radial deviation    Wrist pronation    Wrist supination    Grip strength (lbs)    (Blank rows = not tested)   PALPATION:  Inc TTP and resting tension of bilateral upper traps, rhomboids, and levator scap  TREATMENT DATE:  [facility] Adult PT Treatment:                                                DATE: 06/20/24 Therapeutic Exercise: Nustep level 4 x 5 mins Upper trap stretch BIL 30 Supine dowel flexion 2x10 Seated horizontal abduction RTB 2x10 Seated ER RTB x10 (some pain on L) 1000g ball on wall CW/CCW 30 BIL Pball roll out fwd/lat x10 ea Therapeutic Activity: Standing rows GTB 2x10 Shoulder extension GTB 2x10 Standing press out BTB 2x10 BIL  [facility] Adult PT Treatment:                                                DATE: 06/15/2024   Therapeutic Exercise Seated dowel flexion x 10  Seated B ER YTB x 10  Seated horizontal ABD YTB x 10  Seated bicep curl YTB x 15 B  Fwd and lateral ball rolls x 10 each  Rows GTB 2x10 Shoulder ext RTB 2x10  Standing chest  press BTB 2x10  [facility] Adult PT Treatment:                                                DATE: 05/31/24 Self Care: Pt educated on PT POC, relevant anatomy, physiology, pathology, diagnosis, prognosis, progression of care, pain and activity modification related to symptoms   PATIENT EDUCATION: Education details: Pt educated on relevant anatomy, physiology, pathology, diagnosis, prognosis, progression of care, pain and activity modification related to upper quarter pain Person educated: Patient Education method: Explanation, Demonstration, and Handouts Education comprehension: verbalized understanding and returned demonstration  HOME EXERCISE PROGRAM: Access Code: [email or web address] URL: [email or web address] Date: 06/15/2024 Prepared by: Marijo Berber  Exercises - Standing 'L' Stretch at Counter  - 1 x daily - 7 x weekly - 1 sets - 3 reps - 30sec hold - Standing Shoulder Row with Anchored Resistance  - 1 x daily - 7 x weekly - 3 sets - 10 reps - Shoulder extension with resistance - Neutral  - 1 x daily - 7 x weekly - 3 sets - 10 reps - Standing Shoulder Horizontal Abduction with Resistance  - 1 x daily - 7 x weekly - 3 sets - 10 reps  ASSESSMENT:  CLINICAL IMPRESSION: Patient presents to PT reporting that she feels okay today. She states she has been compliant with her HEP. Today's session focused on periscapular strengthening,as well as some stretching. Patient reports more of a soreness in LUE during exercises. Patient was able to tolerate all exercises, ER was more tolerable today with slight increase in soreness in BIL shoulders. Patient will benefit from skilled PT in order to increase functional mobility and decrease pain.  EVAL Patient is a 53 y.o. F who was seen today for physical therapy evaluation and treatment for bil shoulder pain, cervical spine pain, and thoracic spine pain. Pt symptoms likely have a cervical component due to degree of bil UE ROM and lack of symptoms,  symmetry, and the presence of thoracic spine pain. Pt is not currently working but is limited in her day to day activities,  especially with UE use under weight and repetitive tasks. Symptoms in the cervical spine are consistent with decreased tolerance to facet closing moment L>R.  Pt stands to benefit from skilled physical therapy to address deficit areas and restore safety and improve tolerance with activities and participations at home and in the community.    OBJECTIVE IMPAIRMENTS: decreased activity tolerance, decreased ROM, decreased strength, increased fascial restrictions, impaired perceived functional ability, increased muscle spasms, impaired UE functional use, obesity, and pain.   ACTIVITY LIMITATIONS: carrying, lifting, sleeping, and reach over head  PARTICIPATION LIMITATIONS: meal prep, cleaning, laundry, community activity, and yard work  PERSONAL FACTORS: Time since onset of injury/illness/exacerbation and 3+ comorbidities: obesity, anemia, arthritis are also affecting patient's functional outcome.   REHAB POTENTIAL: Good  CLINICAL DECISION MAKING: Stable/uncomplicated  EVALUATION COMPLEXITY: Low  GOALS: Goals reviewed with patient? Yes  SHORT TERM GOALS: Target date: 06/30/24   Pt will report compliance with HEP to work towards ind and home management strategies Baseline: Goal status: INITIAL   2.  Pt will score no greater than 7/50 on NDI to demonstrate improved activity tolerance Baseline: 17/50 Goal status: INITIAL   3.  Pt will improve cervical and shoulder AROM to full and painless in order to demonstrate progress towards activity tolerance and improved function Baseline: see ROM chart Goal status: INITIAL     LONG TERM GOALS: Target date: 07/26/24   Pt will score no greater than 0/50 on NDI to demonstrate improved activity tolerance Baseline: 17/50 Goal status: INITIAL   2.  Pt will report no greater than 0/10 pain over 7 consecutive days to demonstrate  maintained reduction in symptoms and improved tolerance to activity Baseline:  Goal status: INITIAL   3.  Pt will be ind in the management of their symptoms at home and in the community Baseline:  Goal status: INITIAL     PLAN: PT FREQUENCY: 1-2x/week  PT DURATION: 8 weeks  PLANNED INTERVENTIONS: 97110-Therapeutic exercises, 97530- Therapeutic activity, 97112- Neuromuscular re-education, 97535- Self Care, [id number]- Manual therapy, [id number]- Electrical stimulation (unattended), 20560 (1-2 muscles), 20561 (3+ muscles)- Dry Needling, Patient/Family education, Cryotherapy, and Moist heat  PLAN FOR NEXT SESSION: Upper quarter strength, stability, endurance, mobility, motor control through functional; movement patterns, assess repeated end range movements to cervical spine, shoulders, and or thoracic spine as indicated, establish and modify HEP as indicated   Shanda Code, SPTA 06/20/2024, 8:29 AM

## 2024-06-21 DIAGNOSIS — I4891 Unspecified atrial fibrillation: Secondary | ICD-10-CM | POA: Diagnosis not present

## 2024-06-21 DIAGNOSIS — Z6841 Body Mass Index (BMI) 40.0 and over, adult: Secondary | ICD-10-CM | POA: Diagnosis not present

## 2024-06-21 DIAGNOSIS — I10 Essential (primary) hypertension: Secondary | ICD-10-CM | POA: Diagnosis not present

## 2024-06-22 ENCOUNTER — Encounter: Payer: Self-pay | Admitting: Physical Therapy

## 2024-06-22 ENCOUNTER — Ambulatory Visit: Admitting: Physical Therapy

## 2024-06-22 DIAGNOSIS — G8929 Other chronic pain: Secondary | ICD-10-CM

## 2024-06-22 DIAGNOSIS — M546 Pain in thoracic spine: Secondary | ICD-10-CM

## 2024-06-22 DIAGNOSIS — M6281 Muscle weakness (generalized): Secondary | ICD-10-CM

## 2024-06-22 DIAGNOSIS — M542 Cervicalgia: Secondary | ICD-10-CM

## 2024-06-22 NOTE — Therapy (Signed)
 OUTPATIENT PHYSICAL THERAPY UPPER EXTREMITY TREATMENT   Patient Name: Christine Oneal MRN: [id number] DOB:17-Sep-1970, 53 y.o., female Today's Date: 06/22/2024  END OF SESSION:  PT End of Session - 06/22/24 1222     Visit Number 4    Number of Visits 13    Date for Recertification  07/26/24    Authorization Type [place] medicaid prepaid    Authorization - Number of Visits 5    PT Start Time 1220    PT Stop Time 1303    PT Time Calculation (min) 43 min    Activity Tolerance Patient tolerated treatment well    Behavior During Therapy WFL for tasks assessed/performed          Past Medical History:  Diagnosis Date   Abnormal Pap smear of cervix    Allergy    Anemia    Arthritis    Atrial fibrillation (HCC)    CHF (congestive heart failure) (HCC)    Dysrhythmia    A fib   Family history of adverse reaction to anesthesia    Fibroid    History of atrial fibrillation    History of kidney stones    Hyperparathyroidism    Hypertension    Kidney stones    Leukocytosis, unspecified 10/18/2013   Obesity    Plantar fasciitis    Pneumonia    Past Surgical History:  Procedure Laterality Date   ATRIAL FIBRILLATION ABLATION N/A 08/03/2022   Procedure: ATRIAL FIBRILLATION ABLATION;  Surgeon: Cindie Ole DASEN, MD;  Location: [facility];  Service: Cardiovascular;  Laterality: N/A;   CARDIOVERSION N/A 05/12/2021   Procedure: CARDIOVERSION;  Surgeon: Cherrie Toribio JONELLE, MD;  Location: [facility] ENDOSCOPY;  Service: Cardiovascular;  Laterality: N/A;   CARDIOVERSION N/A 05/29/2021   Procedure: CARDIOVERSION;  Surgeon: Cherrie Toribio JONELLE, MD;  Location: [facility]COPY;  Service: Cardiovascular;  Laterality: N/A;   COLPOSCOPY     PARATHYROIDECTOMY N/A 01/17/2023   Procedure: NECK EXPLORATION WITH PARATHYROIDECTOMY;  Surgeon: Eletha Boas, MD;  Location: [facility] ORS;  Service: General;  Laterality: N/A;   RIGHT/LEFT HEART CATH AND CORONARY ANGIOGRAPHY N/A 05/11/2021   Procedure: RIGHT/LEFT HEART  CATH AND CORONARY ANGIOGRAPHY;  Surgeon: Cherrie Toribio JONELLE, MD;  Location: [facility];  Service: Cardiovascular;  Laterality: N/A;   TEE WITHOUT CARDIOVERSION N/A 05/12/2021   Procedure: TRANSESOPHAGEAL ECHOCARDIOGRAM (TEE);  Surgeon: Cherrie Toribio JONELLE, MD;  Location: [facility] ENDOSCOPY;  Service: Cardiovascular;  Laterality: N/A;   TUBAL LIGATION  2001   Patient Active Problem List   Diagnosis Date Noted   Cervical radiculopathy, chronic 05/04/2024   Allergic rhinitis 03/14/2024   B12 deficiency 03/02/2023   Stage 3b chronic kidney disease (CKD) (HCC) 03/02/2023   Constipation 03/02/2023   Right foot pain 12/09/2022   Urinary incontinence 08/23/2022   Chronic pain of both knees 08/23/2022   Gait instability 08/23/2022   Dysmenorrhea 05/14/2022   Left foot pain 05/14/2022   Encounter for general adult medical examination with abnormal findings 04/17/2022   Primary hyperparathyroidism 11/25/2021   Chronic systolic CHF (congestive heart failure) (HCC) 05/26/2021   Unspecified atrial fibrillation (HCC) 05/04/2021   Anemia 09/04/2017   Morbid obesity (HCC) 09/03/2017   Reactive airway disease 09/03/2017   Vitamin D  deficiency 09/03/2017   Essential hypertension 10/24/2007    PCP: Almarie Cleveland, MD  REFERRING PROVIDER: Joane Artist RAMAN, MD  REFERRING DIAG:  Diagnosis  M25.511,G89.29,M25.512 (ICD-10-CM) - Chronic pain of both shoulders  M54.6,G89.29 (ICD-10-CM) - Chronic bilateral thoracic back pain  THERAPY DIAG:  Cervicalgia  Chronic right shoulder pain  Chronic left shoulder pain  Muscle weakness (generalized)  Pain in thoracic spine  Rationale for Evaluation and Treatment: Rehabilitation  ONSET DATE: RUE x2 months, LUE   SUBJECTIVE:                                                                                                                                                                                      SUBJECTIVE STATEMENT: Pt states that she had  improved tolerance to previous session, noted inc soreness for a few hours following previous session. Reports compliance with HEP, notes that resistance band exercises can inc symptoms in her shoulder and upper trap.   EVAL Pt states that she has been having chronic pain in both shoulders, R shoulder pain has been present for 2 months, wihtout MOI. LUE has been present similar time frame. Presence of arthritis complicates symptoms. [place] about 1 month ago when she was walking down steps, landed on hands and knees, no head contact. Pt noted pain in Bil LE, R foot lateral border, pt states fx present and instructed to allow healing and modify activity, no formal restrictions. Thoracic spine pain inc with fall. Over the last 2 weeks, symptoms are overall worsening with symptoms reaching from the back to the front of the shoulder (bil). She reports symptoms range in intensity from 1/10-8/10 depending on her activity or position as well as time of day.  Hand dominance: Right  PERTINENT HISTORY: Evaluate and Treat for bilat shoulder pain, mid/low back pain 1-2 times per week for 4-6 weeks.   Decrease pain, increase strength, flexibility, function, and range of motion.  Modalities may include, traction, ionto, phono, stim, and dry needling prn.  PAIN:  Are you having pain? Yes: NPRS scale: LUE 3/10, RUE 4/10, 3/10 thoracic spine Pain location: R shoulder, L shoulder, mid thoracic spine Pain description: sche, tight, sore Aggravating factors: reaching, lifting Relieving factors: rest  PRECAUTIONS: None  RED FLAGS: None   WEIGHT BEARING RESTRICTIONS: No  FALLS:  Has patient fallen in last 6 months? Yes. Number of falls 1  LIVING ENVIRONMENT: Lives with: lives with their family Lives in: House/apartment Stairs: Yes: External: 2 steps; on left going up Has following equipment at home: None  OCCUPATION: Not working   PLOF: Independent  PATIENT GOALS: to be able to back to normal    NEXT MD VISIT: 06/01/24  OBJECTIVE:  Note: Objective measures were completed at Evaluation unless otherwise noted.  DIAGNOSTIC FINDINGS:  Thoracic spine 05/16/24: Degenerative change without acute abnormality.  L shoulder 05/16/24: No acute fracture or dislocation is noted. Underlying bony thorax appears within normal  limits. No soft tissue abnormality is seen. Mild degenerative changes of the acromioclavicular joint are noted.  IMPRESSION: No acute abnormality noted.   PATIENT SURVEYS :  NDI:  NECK DISABILITY INDEX  Date: 05/31/24 Score  Pain intensity 2 = The pain is moderate at the moment  2. Personal care (washing, dressing, etc.) 1 =  I can look after myself normally but it causes extra pain  3. Lifting 4 =  I can only lift very light weights  4. Reading 0 = I can read as much as I want to with no pain in my neck  5. Headaches 1 =  I have slight headaches, which come infrequently  6. Concentration 0 =  I can concentrate fully when I want to with no difficulty  7. Work 2 = I can do most of my usual work, but no more  8. Driving 1 =  I can drive my car as long as I want with slight pain in my neck  9. Sleeping 2 = My sleep is mildly disturbed (1-2 hrs sleepless)  10. Recreation 4 =  I can hardly do any recreation activities because of pain in my neck  Total 17/50   Minimum Detectable Change (90% confidence): 5 points or 10% points  COGNITION: Overall cognitive status: Within functional limits for tasks assessed     SENSATION: WFL  POSTURE: Forward head, rounded shoulders, inc lumbar lordosis, inc thoracic kyphosis  CERVICAL SPINE ROM:  05/31/24    Flexion: nil loss, tightness in upper c/s    Extension: nil loss, R side c/s inc pain    Side bend R: nil loss, L sided stretch     Side bend L: 25% loss, R sided pull, L sided c/s pain   Rot R:  25% loss, R mid c/s Rot L:  nil loss, upper L c/s pain  UPPER EXTREMITY ROM:   Active ROM Right eval Left eval  Shoulder  flexion [facility] WFL  Shoulder extension [facility]  Shoulder abduction [facility]  Shoulder adduction    Shoulder functional internal rotation L2 P in R shoulder T9 P in L shoulder  Shoulder functional external rotation Mid clavicle P in R arm to mid brachium Mid clavicle P in L shoulder  Elbow flexion    Elbow extension    Wrist flexion    Wrist extension    Wrist ulnar deviation    Wrist radial deviation    Wrist pronation    Wrist supination    (Blank rows = not tested)  UPPER EXTREMITY MMT:  MMT Right eval Left eval  Shoulder flexion 4+/5 4+/5  Shoulder extension 4+/5 4+/5  Shoulder abduction 4/5 4/5  Shoulder adduction 4+/5 P in R shoulder 4+/5 P in L shoulder  Shoulder internal rotation 4+/5 4+/5  Shoulder external rotation 4/5 4/5  Middle trapezius    Lower trapezius    Elbow flexion    Elbow extension    Wrist flexion    Wrist extension    Wrist ulnar deviation    Wrist radial deviation    Wrist pronation    Wrist supination    Grip strength (lbs)    (Blank rows = not tested)   PALPATION:  Inc TTP and resting tension of bilateral upper traps, rhomboids, and levator scap  TREATMENT DATE:  [facility] Adult PT Treatment:                                                DATE: 06/22/24 Therapeutic Exercise: UBE x6 mins seat position 10 forwards  Thoracic spine extension in sitting 2x15 with small ball fulcrum, notes inc c/s pain in third set Repeated cervical spine retraction in sitting x10: dec, better symptoms of left c/s pain about C2-4 in right side bending  X10 additional: continues to note improvements  X10 additional: dec better, ROM improved to nil loss form 25% loss Manual Therapy: Manual soft tissue mobilization with pt seated to cervicothoracic region with inc attention to the upper trap, levator scap, and teres major muscle bellies noting inc  adhesions and resting tension which improves as intervention progresses. Trigger point release to R upper trap at junction x90s well tolerated, reduction noted, unable to attain full release   [facility] Adult PT Treatment:                                                DATE: 06/20/24 Therapeutic Exercise: Nustep level 4 x 5 mins Upper trap stretch BIL 30 Supine dowel flexion 2x10 Seated horizontal abduction RTB 2x10 Seated ER RTB x10 (some pain on L) 1000g ball on wall CW/CCW 30 BIL Pball roll out fwd/lat x10 ea Therapeutic Activity: Standing rows GTB 2x10 Shoulder extension GTB 2x10 Standing press out BTB 2x10 BIL  [facility] Adult PT Treatment:                                                DATE: 06/15/2024   Therapeutic Exercise Seated dowel flexion x 10  Seated B ER YTB x 10  Seated horizontal ABD YTB x 10  Seated bicep curl YTB x 15 B  Fwd and lateral ball rolls x 10 each  Rows GTB 2x10 Shoulder ext RTB 2x10  Standing chest press BTB 2x10  [facility] Adult PT Treatment:                                                DATE: 05/31/24 Self Care: Pt educated on PT POC, relevant anatomy, physiology, pathology, diagnosis, prognosis, progression of care, pain and activity modification related to symptoms   PATIENT EDUCATION: Education details: Pt educated on relevant anatomy, physiology, pathology, diagnosis, prognosis, progression of care, pain and activity modification related to upper quarter pain Person educated: Patient Education method: Explanation, Demonstration, and Handouts Education comprehension: verbalized understanding and returned demonstration  HOME EXERCISE PROGRAM: Access Code: [email or web address] URL: [email or web address] Date: 06/22/2024 Prepared by: Stann Ohara  Exercises - Standing 'L' Stretch at Counter  - 1 x daily - 7 x weekly - 1 sets - 3 reps - 30sec hold - Standing Shoulder Row with Anchored Resistance  - 1 x daily - 7 x weekly - 3 sets - 10 reps -  Shoulder extension with resistance - Neutral  - 1 x daily - 4  x weekly - 3 sets - 10 reps - Standing Shoulder Horizontal Abduction with Resistance  - 1 x daily - 4 x weekly - 3 sets - 10 reps - Seated Shoulder Shrugs  - 1 x daily - 7 x weekly - 1 sets - 30 reps - 2 hold - Seated Upper Trapezius Stretch  - 1 x daily - 7 x weekly - 3 sets - 1 reps - 30s hold - Cervical Retraction with Overpressure  - 5 x daily - 7 x weekly - 1 sets - 10 reps - 2 hold  ASSESSMENT:  CLINICAL IMPRESSION: Pt tolerated session well and was able to demonstrate a directional preference of cervical retraction and address scapulothoracic mobility and soft tissue work. HEP progressed and modified to reflect session. Pt stands to benefit from continued skilled physical therapy to address deficit areas and restore safety with activities and participations at home and in the community.     EVAL Patient is a 53 y.o. F who was seen today for physical therapy evaluation and treatment for bil shoulder pain, cervical spine pain, and thoracic spine pain. Pt symptoms likely have a cervical component due to degree of bil UE ROM and lack of symptoms, symmetry, and the presence of thoracic spine pain. Pt is not currently working but is limited in her day to day activities, especially with UE use under weight and repetitive tasks. Symptoms in the cervical spine are consistent with decreased tolerance to facet closing moment L>R.  Pt stands to benefit from skilled physical therapy to address deficit areas and restore safety and improve tolerance with activities and participations at home and in the community.    OBJECTIVE IMPAIRMENTS: decreased activity tolerance, decreased ROM, decreased strength, increased fascial restrictions, impaired perceived functional ability, increased muscle spasms, impaired UE functional use, obesity, and pain.   ACTIVITY LIMITATIONS: carrying, lifting, sleeping, and reach over head  PARTICIPATION LIMITATIONS:  meal prep, cleaning, laundry, community activity, and yard work  PERSONAL FACTORS: Time since onset of injury/illness/exacerbation and 3+ comorbidities: obesity, anemia, arthritis are also affecting patient's functional outcome.   REHAB POTENTIAL: Good  CLINICAL DECISION MAKING: Stable/uncomplicated  EVALUATION COMPLEXITY: Low  GOALS: Goals reviewed with patient? Yes  SHORT TERM GOALS: Target date: 06/30/24   Pt will report compliance with HEP to work towards ind and home management strategies Baseline: Goal status: INITIAL   2.  Pt will score no greater than 7/50 on NDI to demonstrate improved activity tolerance Baseline: 17/50 Goal status: INITIAL   3.  Pt will improve cervical and shoulder AROM to full and painless in order to demonstrate progress towards activity tolerance and improved function Baseline: see ROM chart Goal status: INITIAL     LONG TERM GOALS: Target date: 07/26/24   Pt will score no greater than 0/50 on NDI to demonstrate improved activity tolerance Baseline: 17/50 Goal status: INITIAL   2.  Pt will report no greater than 0/10 pain over 7 consecutive days to demonstrate maintained reduction in symptoms and improved tolerance to activity Baseline:  Goal status: INITIAL   3.  Pt will be ind in the management of their symptoms at home and in the community Baseline:  Goal status: INITIAL     PLAN: PT FREQUENCY: 1-2x/week  PT DURATION: 8 weeks  PLANNED INTERVENTIONS: 97110-Therapeutic exercises, 97530- Therapeutic activity, [id number]- Neuromuscular re-education, 97535- Self Care, [id number]- Manual therapy, [id number]- Electrical stimulation (unattended), 20560 (1-2 muscles), 20561 (3+ muscles)- Dry Needling, Patient/Family education, Cryotherapy, and Moist heat  PLAN FOR  NEXT SESSION: Upper quarter strength, stability, endurance, mobility, motor control through functional; movement patterns, assess repeated end range movements to cervical spine, shoulders, and or  thoracic spine as indicated, establish and modify HEP as indicated   Stann DELENA Ohara, PT 06/22/2024, 1:44 PM

## 2024-06-25 ENCOUNTER — Ambulatory Visit

## 2024-06-25 DIAGNOSIS — I5022 Chronic systolic (congestive) heart failure: Secondary | ICD-10-CM | POA: Diagnosis not present

## 2024-06-25 DIAGNOSIS — R32 Unspecified urinary incontinence: Secondary | ICD-10-CM | POA: Diagnosis not present

## 2024-06-25 DIAGNOSIS — I10 Essential (primary) hypertension: Secondary | ICD-10-CM | POA: Diagnosis not present

## 2024-06-27 ENCOUNTER — Ambulatory Visit: Admitting: Physical Therapy

## 2024-06-27 ENCOUNTER — Encounter: Payer: Self-pay | Admitting: Physical Therapy

## 2024-06-27 DIAGNOSIS — M6281 Muscle weakness (generalized): Secondary | ICD-10-CM

## 2024-06-27 DIAGNOSIS — M546 Pain in thoracic spine: Secondary | ICD-10-CM

## 2024-06-27 DIAGNOSIS — M542 Cervicalgia: Secondary | ICD-10-CM

## 2024-06-27 DIAGNOSIS — G8929 Other chronic pain: Secondary | ICD-10-CM

## 2024-06-27 NOTE — Therapy (Signed)
 OUTPATIENT PHYSICAL THERAPY UPPER EXTREMITY TREATMENT   Patient Name: Christine Oneal MRN: [id number] DOB:21-Aug-1970, 53 y.o., female Today's Date: 06/27/2024  END OF SESSION:  PT End of Session - 06/27/24 0915     Visit Number 5    Number of Visits 13    Date for Recertification  07/26/24    Authorization Type [place]    Authorization - Visit Number 5    PT Start Time [phone]    PT Stop Time 1000    PT Time Calculation (min) 44 min    Activity Tolerance Patient tolerated treatment well    Behavior During Therapy [facility] for tasks assessed/performed           Past Medical History:  Diagnosis Date   Abnormal Pap smear of cervix    Allergy    Anemia    Arthritis    Atrial fibrillation (HCC)    CHF (congestive heart failure) (HCC)    Dysrhythmia    A fib   Family history of adverse reaction to anesthesia    Fibroid    History of atrial fibrillation    History of kidney stones    Hyperparathyroidism    Hypertension    Kidney stones    Leukocytosis, unspecified 10/18/2013   Obesity    Plantar fasciitis    Pneumonia    Past Surgical History:  Procedure Laterality Date   ATRIAL FIBRILLATION ABLATION N/A 08/03/2022   Procedure: ATRIAL FIBRILLATION ABLATION;  Surgeon: Cindie Ole DASEN, MD;  Location: [facility] INVASIVE CV LAB;  Service: Cardiovascular;  Laterality: N/A;   CARDIOVERSION N/A 05/12/2021   Procedure: CARDIOVERSION;  Surgeon: Cherrie Toribio JONELLE, MD;  Location: [facility] ENDOSCOPY;  Service: Cardiovascular;  Laterality: N/A;   CARDIOVERSION N/A 05/29/2021   Procedure: CARDIOVERSION;  Surgeon: Cherrie Toribio JONELLE, MD;  Location: [facility] ENDOSCOPY;  Service: Cardiovascular;  Laterality: N/A;   COLPOSCOPY     PARATHYROIDECTOMY N/A 01/17/2023   Procedure: NECK EXPLORATION WITH PARATHYROIDECTOMY;  Surgeon: Eletha Boas, MD;  Location: [facility] ORS;  Service: General;  Laterality: N/A;   RIGHT/LEFT HEART CATH AND CORONARY ANGIOGRAPHY N/A 05/11/2021   Procedure: RIGHT/LEFT HEART  CATH AND CORONARY ANGIOGRAPHY;  Surgeon: Cherrie Toribio JONELLE, MD;  Location: [facility] INVASIVE CV LAB;  Service: Cardiovascular;  Laterality: N/A;   TEE WITHOUT CARDIOVERSION N/A 05/12/2021   Procedure: TRANSESOPHAGEAL ECHOCARDIOGRAM (TEE);  Surgeon: Cherrie Toribio JONELLE, MD;  Location: [facility] ENDOSCOPY;  Service: Cardiovascular;  Laterality: N/A;   TUBAL LIGATION  2001   Patient Active Problem List   Diagnosis Date Noted   Cervical radiculopathy, chronic 05/04/2024   Allergic rhinitis 03/14/2024   B12 deficiency 03/02/2023   Stage 3b chronic kidney disease (CKD) (HCC) 03/02/2023   Constipation 03/02/2023   Right foot pain 12/09/2022   Urinary incontinence 08/23/2022   Chronic pain of both knees 08/23/2022   Gait instability 08/23/2022   Dysmenorrhea 05/14/2022   Left foot pain 05/14/2022   Encounter for general adult medical examination with abnormal findings 04/17/2022   Primary hyperparathyroidism 11/25/2021   Chronic systolic CHF (congestive heart failure) (HCC) 05/26/2021   Unspecified atrial fibrillation (HCC) 05/04/2021   Anemia 09/04/2017   Morbid obesity (HCC) 09/03/2017   Reactive airway disease 09/03/2017   Vitamin D  deficiency 09/03/2017   Essential hypertension 10/24/2007    PCP: Almarie Cleveland, MD  REFERRING PROVIDER: Joane Artist RAMAN, MD  REFERRING DIAG:  Diagnosis  M25.511,G89.29,M25.512 (ICD-10-CM) - Chronic pain of both shoulders  M54.6,G89.29 (ICD-10-CM) - Chronic bilateral thoracic back pain  THERAPY DIAG:  Cervicalgia  Chronic right shoulder pain  Chronic left shoulder pain  Muscle weakness (generalized)  Pain in thoracic spine  Rationale for Evaluation and Treatment: Rehabilitation  ONSET DATE: RUE x2 months, LUE   SUBJECTIVE:                                                                                                                                                                                      SUBJECTIVE STATEMENT:  Pt states that she  has been doing well since her previous session, reports compliance with HEP. Notes that she has been able to maintain reduction of symptoms from prior session. Endorses ache in L knee today 2/10.    EVAL Pt states that she has been having chronic pain in both shoulders, R shoulder pain has been present for 2 months, wihtout MOI. LUE has been present similar time frame. Presence of arthritis complicates symptoms. [place] about 1 month ago when she was walking down steps, landed on hands and knees, no head contact. Pt noted pain in Bil LE, R foot lateral border, pt states fx present and instructed to allow healing and modify activity, no formal restrictions. Thoracic spine pain inc with fall. Over the last 2 weeks, symptoms are overall worsening with symptoms reaching from the back to the front of the shoulder (bil). She reports symptoms range in intensity from 1/10-8/10 depending on her activity or position as well as time of day.  Hand dominance: Right  PERTINENT HISTORY: Evaluate and Treat for bilat shoulder pain, mid/low back pain 1-2 times per week for 4-6 weeks.   Decrease pain, increase strength, flexibility, function, and range of motion.  Modalities may include, traction, ionto, phono, stim, and dry needling prn.  PAIN:  Are you having pain? Yes: NPRS scale: LUE 0/10, RUE 2/10, 0/10 thoracic spine Pain location: R shoulder, L shoulder, mid thoracic spine Pain description: sche, tight, sore Aggravating factors: reaching, lifting Relieving factors: rest  PRECAUTIONS: None  RED FLAGS: None   WEIGHT BEARING RESTRICTIONS: No  FALLS:  Has patient fallen in last 6 months? Yes. Number of falls 1  LIVING ENVIRONMENT: Lives with: lives with their family Lives in: House/apartment Stairs: Yes: External: 2 steps; on left going up Has following equipment at home: None  OCCUPATION: Not working   PLOF: Independent  PATIENT GOALS: to be able to back to normal   NEXT MD VISIT:  06/01/24  OBJECTIVE:  Note: Objective measures were completed at Evaluation unless otherwise noted.  DIAGNOSTIC FINDINGS:  Thoracic spine 05/16/24: Degenerative change without acute abnormality.  L shoulder 05/16/24: No acute fracture or dislocation is noted. Underlying bony thorax appears within  normal limits. No soft tissue abnormality is seen. Mild degenerative changes of the acromioclavicular joint are noted.  IMPRESSION: No acute abnormality noted.   PATIENT SURVEYS :  NDI:  NECK DISABILITY INDEX  Date: 05/31/24 Score  Pain intensity 2 = The pain is moderate at the moment  2. Personal care (washing, dressing, etc.) 1 =  I can look after myself normally but it causes extra pain  3. Lifting 4 =  I can only lift very light weights  4. Reading 0 = I can read as much as I want to with no pain in my neck  5. Headaches 1 =  I have slight headaches, which come infrequently  6. Concentration 0 =  I can concentrate fully when I want to with no difficulty  7. Work 2 = I can do most of my usual work, but no more  8. Driving 1 =  I can drive my car as long as I want with slight pain in my neck  9. Sleeping 2 = My sleep is mildly disturbed (1-2 hrs sleepless)  10. Recreation 4 =  I can hardly do any recreation activities because of pain in my neck  Total 17/50   Minimum Detectable Change (90% confidence): 5 points or 10% points  COGNITION: Overall cognitive status: Within functional limits for tasks assessed     SENSATION: WFL  POSTURE: Forward head, rounded shoulders, inc lumbar lordosis, inc thoracic kyphosis  CERVICAL SPINE ROM:  05/31/24    Flexion: nil loss, tightness in upper c/s    Extension: nil loss, R side c/s inc pain    Side bend R: nil loss, L sided stretch     Side bend L: 25% loss, R sided pull, L sided c/s pain   Rot R:  25% loss, R mid c/s Rot L:  nil loss, upper L c/s pain  UPPER EXTREMITY ROM:   Active ROM Right eval Left eval  Shoulder flexion [facility] WFL   Shoulder extension [facility]  Shoulder abduction [facility]  Shoulder adduction    Shoulder functional internal rotation L2 P in R shoulder T9 P in L shoulder  Shoulder functional external rotation Mid clavicle P in R arm to mid brachium Mid clavicle P in L shoulder  Elbow flexion    Elbow extension    Wrist flexion    Wrist extension    Wrist ulnar deviation    Wrist radial deviation    Wrist pronation    Wrist supination    (Blank rows = not tested)  UPPER EXTREMITY MMT:  MMT Right eval Left eval  Shoulder flexion 4+/5 4+/5  Shoulder extension 4+/5 4+/5  Shoulder abduction 4/5 4/5  Shoulder adduction 4+/5 P in R shoulder 4+/5 P in L shoulder  Shoulder internal rotation 4+/5 4+/5  Shoulder external rotation 4/5 4/5  Middle trapezius    Lower trapezius    Elbow flexion    Elbow extension    Wrist flexion    Wrist extension    Wrist ulnar deviation    Wrist radial deviation    Wrist pronation    Wrist supination    Grip strength (lbs)    (Blank rows = not tested)   PALPATION:  Inc TTP and resting tension of bilateral upper traps, rhomboids, and levator scap  TREATMENT DATE:   [facility] Adult PT Treatment:                                                DATE: 06/27/24 Therapeutic Exercise: Seated hamstring isometrics 10x10s BLE Seated physioball press x30, 3 sec hold  Physioball wall walks 2x10 with anterior lean in BUE flexion Overhead lat stretch 3x15 with dowel  Lower trunk rotation 2x15 bilaterally, 2-3 sec hold, gait belt around knees  Shoulder shrug/antishrug 2x10 in sitting   [facility] Adult PT Treatment:                                                DATE: 06/22/24 Therapeutic Exercise: UBE x6 mins seat position 10 forwards  Thoracic spine extension in sitting 2x15 with small ball fulcrum, notes inc c/s pain in third set Repeated cervical spine  retraction in sitting x10: dec, better symptoms of left c/s pain about C2-4 in right side bending  X10 additional: continues to note improvements  X10 additional: dec better, ROM improved to nil loss form 25% loss Manual Therapy: Manual soft tissue mobilization with pt seated to cervicothoracic region with inc attention to the upper trap, levator scap, and teres major muscle bellies noting inc adhesions and resting tension which improves as intervention progresses. Trigger point release to R upper trap at junction x90s well tolerated, reduction noted, unable to attain full release   [facility] Adult PT Treatment:                                                DATE: 06/20/24 Therapeutic Exercise: Nustep level 4 x 5 mins Upper trap stretch BIL 30 Supine dowel flexion 2x10 Seated horizontal abduction RTB 2x10 Seated ER RTB x10 (some pain on L) 1000g ball on wall CW/CCW 30 BIL Pball roll out fwd/lat x10 ea Therapeutic Activity: Standing rows GTB 2x10 Shoulder extension GTB 2x10 Standing press out BTB 2x10 BIL  [facility] Adult PT Treatment:                                                DATE: 06/15/2024   Therapeutic Exercise Seated dowel flexion x 10  Seated B ER YTB x 10  Seated horizontal ABD YTB x 10  Seated bicep curl YTB x 15 B  Fwd and lateral ball rolls x 10 each  Rows GTB 2x10 Shoulder ext RTB 2x10  Standing chest press BTB 2x10  [facility] Adult PT Treatment:                                                DATE: 05/31/24 Self Care: Pt educated on PT POC, relevant anatomy, physiology, pathology, diagnosis, prognosis, progression of care, pain and activity modification related to symptoms   PATIENT EDUCATION: Education details: Pt educated on relevant anatomy, physiology, pathology, diagnosis, prognosis,  progression of care, pain and activity modification related to upper quarter pain Person educated: Patient Education method: Explanation, Demonstration, and Handouts Education  comprehension: verbalized understanding and returned demonstration  HOME EXERCISE PROGRAM: Access Code: [email or web address] URL: [email or web address] Date: 06/22/2024 Prepared by: Stann Ohara  Exercises - Standing 'L' Stretch at Counter  - 1 x daily - 7 x weekly - 1 sets - 3 reps - 30sec hold - Standing Shoulder Row with Anchored Resistance  - 1 x daily - 7 x weekly - 3 sets - 10 reps - Shoulder extension with resistance - Neutral  - 1 x daily - 4 x weekly - 3 sets - 10 reps - Standing Shoulder Horizontal Abduction with Resistance  - 1 x daily - 4 x weekly - 3 sets - 10 reps - Seated Shoulder Shrugs  - 1 x daily - 7 x weekly - 1 sets - 30 reps - 2 hold - Seated Upper Trapezius Stretch  - 1 x daily - 7 x weekly - 3 sets - 1 reps - 30s hold - Cervical Retraction with Overpressure  - 5 x daily - 7 x weekly - 1 sets - 10 reps - 2 hold  ASSESSMENT:  CLINICAL IMPRESSION:  Pt has been responding well to her current regimen and has been able to maintain reduction of symptoms with retraction principles in the cervical spine. Progressed overhead positioning and thoracic strength and stability today without symptom limitation. Pt stands to benefit from continued skilled physical therapy to address deficit areas and restore safety with activities and participations at home and in the community.     EVAL Patient is a 53 y.o. F who was seen today for physical therapy evaluation and treatment for bil shoulder pain, cervical spine pain, and thoracic spine pain. Pt symptoms likely have a cervical component due to degree of bil UE ROM and lack of symptoms, symmetry, and the presence of thoracic spine pain. Pt is not currently working but is limited in her day to day activities, especially with UE use under weight and repetitive tasks. Symptoms in the cervical spine are consistent with decreased tolerance to facet closing moment L>R.  Pt stands to benefit from skilled physical therapy to address deficit  areas and restore safety and improve tolerance with activities and participations at home and in the community.    OBJECTIVE IMPAIRMENTS: decreased activity tolerance, decreased ROM, decreased strength, increased fascial restrictions, impaired perceived functional ability, increased muscle spasms, impaired UE functional use, obesity, and pain.   ACTIVITY LIMITATIONS: carrying, lifting, sleeping, and reach over head  PARTICIPATION LIMITATIONS: meal prep, cleaning, laundry, community activity, and yard work  PERSONAL FACTORS: Time since onset of injury/illness/exacerbation and 3+ comorbidities: obesity, anemia, arthritis are also affecting patient's functional outcome.   REHAB POTENTIAL: Good  CLINICAL DECISION MAKING: Stable/uncomplicated  EVALUATION COMPLEXITY: Low  GOALS: Goals reviewed with patient? Yes  SHORT TERM GOALS: Target date: 06/30/24   Pt will report compliance with HEP to work towards ind and home management strategies Baseline: Goal status: INITIAL   2.  Pt will score no greater than 7/50 on NDI to demonstrate improved activity tolerance Baseline: 17/50 Goal status: INITIAL   3.  Pt will improve cervical and shoulder AROM to full and painless in order to demonstrate progress towards activity tolerance and improved function Baseline: see ROM chart Goal status: INITIAL     LONG TERM GOALS: Target date: 07/26/24   Pt will score no greater than 0/50 on NDI to demonstrate improved activity  tolerance Baseline: 17/50 Goal status: INITIAL   2.  Pt will report no greater than 0/10 pain over 7 consecutive days to demonstrate maintained reduction in symptoms and improved tolerance to activity Baseline:  Goal status: INITIAL   3.  Pt will be ind in the management of their symptoms at home and in the community Baseline:  Goal status: INITIAL     PLAN: PT FREQUENCY: 1-2x/week  PT DURATION: 8 weeks  PLANNED INTERVENTIONS: 97110-Therapeutic exercises, 97530-  Therapeutic activity, 97112- Neuromuscular re-education, 97535- Self Care, [id number]- Manual therapy, [id number]- Electrical stimulation (unattended), 20560 (1-2 muscles), 20561 (3+ muscles)- Dry Needling, Patient/Family education, Cryotherapy, and Moist heat  PLAN FOR NEXT SESSION: Upper quarter strength, stability, endurance, mobility, motor control through functional; movement patterns, assess repeated end range movements to cervical spine, shoulders, and or thoracic spine as indicated, establish and modify HEP as indicated   Stann DELENA Ohara, PT 06/27/2024, 9:43 AM

## 2024-07-10 ENCOUNTER — Ambulatory Visit

## 2024-07-10 DIAGNOSIS — M6281 Muscle weakness (generalized): Secondary | ICD-10-CM

## 2024-07-10 DIAGNOSIS — M546 Pain in thoracic spine: Secondary | ICD-10-CM

## 2024-07-10 DIAGNOSIS — G8929 Other chronic pain: Secondary | ICD-10-CM

## 2024-07-10 DIAGNOSIS — M542 Cervicalgia: Secondary | ICD-10-CM

## 2024-07-10 NOTE — Therapy (Signed)
 " OUTPATIENT PHYSICAL THERAPY RE-CERTIFICATION  Progress Note Reporting Period 05/31/2024 to 07/10/2024  See note below for Objective Data and Assessment of Progress/Goals.      Patient Name: Christine Oneal MRN: [id number] DOB:07-Sep-1970, 53 y.o., female Today's Date: 07/10/2024  END OF SESSION:  PT End of Session - 07/10/24 1033     Visit Number 6    Number of Visits 13    Date for Recertification  07/26/24    Authorization Type [place] medicaid prepaid    Authorization Time Period 06/13/24-08/11/24    Authorization - Visit Number 5    Authorization - Number of Visits 5    PT Start Time 1045    PT Stop Time 1125    PT Time Calculation (min) 40 min           Past Medical History:  Diagnosis Date   Abnormal Pap smear of cervix    Allergy    Anemia    Arthritis    Atrial fibrillation (HCC)    CHF (congestive heart failure) (HCC)    Dysrhythmia    A fib   Family history of adverse reaction to anesthesia    Fibroid    History of atrial fibrillation    History of kidney stones    Hyperparathyroidism    Hypertension    Kidney stones    Leukocytosis, unspecified 10/18/2013   Obesity    Plantar fasciitis    Pneumonia    Past Surgical History:  Procedure Laterality Date   ATRIAL FIBRILLATION ABLATION N/A 08/03/2022   Procedure: ATRIAL FIBRILLATION ABLATION;  Surgeon: Cindie Ole DASEN, MD;  Location: [facility];  Service: Cardiovascular;  Laterality: N/A;   CARDIOVERSION N/A 05/12/2021   Procedure: CARDIOVERSION;  Surgeon: Cherrie Toribio JONELLE, MD;  Location: [facility] ENDOSCOPY;  Service: Cardiovascular;  Laterality: N/A;   CARDIOVERSION N/A 05/29/2021   Procedure: CARDIOVERSION;  Surgeon: Cherrie Toribio JONELLE, MD;  Location: [facility] ENDOSCOPY;  Service: Cardiovascular;  Laterality: N/A;   COLPOSCOPY     PARATHYROIDECTOMY N/A 01/17/2023   Procedure: NECK EXPLORATION WITH PARATHYROIDECTOMY;  Surgeon: Eletha Boas, MD;  Location: [facility] ORS;  Service: General;  Laterality: N/A;    RIGHT/LEFT HEART CATH AND CORONARY ANGIOGRAPHY N/A 05/11/2021   Procedure: RIGHT/LEFT HEART CATH AND CORONARY ANGIOGRAPHY;  Surgeon: Cherrie Toribio JONELLE, MD;  Location: [facility] CV [facility];  Service: Cardiovascular;  Laterality: N/A;   TEE WITHOUT CARDIOVERSION N/A 05/12/2021   Procedure: TRANSESOPHAGEAL ECHOCARDIOGRAM (TEE);  Surgeon: Cherrie Toribio JONELLE, MD;  Location: [facility] ENDOSCOPY;  Service: Cardiovascular;  Laterality: N/A;   TUBAL LIGATION  2001   Patient Active Problem List   Diagnosis Date Noted   Cervical radiculopathy, chronic 05/04/2024   Allergic rhinitis 03/14/2024   B12 deficiency 03/02/2023   Stage 3b chronic kidney disease (CKD) (HCC) 03/02/2023   Constipation 03/02/2023   Right foot pain 12/09/2022   Urinary incontinence 08/23/2022   Chronic pain of both knees 08/23/2022   Gait instability 08/23/2022   Dysmenorrhea 05/14/2022   Left foot pain 05/14/2022   Encounter for general adult medical examination with abnormal findings 04/17/2022   Primary hyperparathyroidism 11/25/2021   Chronic systolic CHF (congestive heart failure) (HCC) 05/26/2021   Unspecified atrial fibrillation (HCC) 05/04/2021   Anemia 09/04/2017   Morbid obesity (HCC) 09/03/2017   Reactive airway disease 09/03/2017   Vitamin D  deficiency 09/03/2017   Essential hypertension 10/24/2007    PCP: Almarie Cleveland, MD  REFERRING PROVIDER: Joane Artist RAMAN, MD  REFERRING DIAG:  Diagnosis  M25.511,G89.29,M25.512 (ICD-10-CM) - Chronic pain of both shoulders  M54.6,G89.29 (ICD-10-CM) - Chronic bilateral thoracic back pain    THERAPY DIAG:  Cervicalgia  Chronic right shoulder pain  Chronic left shoulder pain  Muscle weakness (generalized)  Pain in thoracic spine  Rationale for Evaluation and Treatment: Rehabilitation  ONSET DATE: RUE x2 months, LUE   SUBJECTIVE:                                                                                                                                                                                       SUBJECTIVE STATEMENT:   Progress Note:  Pt reports her mid back continues to bother her when standing prolonged periods of time, walking more than 10 mins, and exercising. Her neck is mostly an issue with sleep. The pts shoulders continue to feel sore especially with reaching but her pain alternates between arms. Overall she reports feeling 35% better. She wants to continue PT to avoid injections.    EVAL Pt states that she has been having chronic pain in both shoulders, R shoulder pain has been present for 2 months, wihtout MOI. LUE has been present similar time frame. Presence of arthritis complicates symptoms. [place] about 1 month ago when she was walking down steps, landed on hands and knees, no head contact. Pt noted pain in Bil LE, R foot lateral border, pt states fx present and instructed to allow healing and modify activity, no formal restrictions. Thoracic spine pain inc with fall. Over the last 2 weeks, symptoms are overall worsening with symptoms reaching from the back to the front of the shoulder (bil). She reports symptoms range in intensity from 1/10-8/10 depending on her activity or position as well as time of day.  Hand dominance: Right  PERTINENT HISTORY: Evaluate and Treat for bilat shoulder pain, mid/low back pain 1-2 times per week for 4-6 weeks.   Decrease pain, increase strength, flexibility, function, and range of motion.  Modalities may include, traction, ionto, phono, stim, and dry needling prn.  PAIN:  Are you having pain? Yes: NPRS scale: LUE 0/10, RUE 2/10, 0/10 thoracic spine Pain location: R shoulder, L shoulder, mid thoracic spine Pain description: sche, tight, sore Aggravating factors: reaching, lifting Relieving factors: rest  PRECAUTIONS: None  RED FLAGS: None   WEIGHT BEARING RESTRICTIONS: No  FALLS:  Has patient fallen in last 6 months? Yes. Number of falls 1  LIVING ENVIRONMENT: Lives with: lives with  their family Lives in: House/apartment Stairs: Yes: External: 2 steps; on left going up Has following equipment at home: None  OCCUPATION: Not working   PLOF: Independent  PATIENT GOALS: to be able to back to normal  NEXT MD VISIT: 06/01/24  OBJECTIVE:  Note: Objective measures were completed at Evaluation unless otherwise noted.  DIAGNOSTIC FINDINGS:  Thoracic spine 05/16/24: Degenerative change without acute abnormality.  L shoulder 05/16/24: No acute fracture or dislocation is noted. Underlying bony thorax appears within normal limits. No soft tissue abnormality is seen. Mild degenerative changes of the acromioclavicular joint are noted.  IMPRESSION: No acute abnormality noted.   PATIENT SURVEYS :  NDI:  NECK DISABILITY INDEX   Date: 05/31/24 Score 07/10/2024  Pain intensity 2 = The pain is moderate at the moment 1  2. Personal care (washing, dressing, etc.) 1 =  I can look after myself normally but it causes extra pain 0  3. Lifting 4 =  I can only lift very light weights 4  4. Reading 0 = I can read as much as I want to with no pain in my neck 0  5. Headaches 1 =  I have slight headaches, which come infrequently 1  6. Concentration 0 =  I can concentrate fully when I want to with no difficulty 0  7. Work 2 = I can do most of my usual work, but no more 2  8. Driving 1 =  I can drive my car as long as I want with slight pain in my neck 0  9. Sleeping 2 = My sleep is mildly disturbed (1-2 hrs sleepless) 1  10. Recreation 4 =  I can hardly do any recreation activities because of pain in my neck 0  Total 17/50 9/50   Minimum Detectable Change (90% confidence): 5 points or 10% points  COGNITION: Overall cognitive status: Within functional limits for tasks assessed     SENSATION: WFL  POSTURE: Forward head, rounded shoulders, inc lumbar lordosis, inc thoracic kyphosis  CERVICAL SPINE ROM:  05/31/24    Flexion: nil loss, tightness in upper c/s    Extension: nil loss,  R side c/s inc pain    Side bend R: nil loss, L sided stretch     Side bend L: 25% loss, R sided pull, L sided c/s pain   Rot R:  25% loss, R mid c/s Rot L:  nil loss, upper L c/s pain 07/10/24    Flexion: nil loss   Extension: nil loss, R side c/s inc pain    Side bend R: 25% loss, R side c/s inc pain     Side bend L: nil loss, R sided pull  Rot R:  25% loss, R mid c/s inc pain Rot L:  nil loss, upper R c/s pain  UPPER EXTREMITY ROM:   Active ROM Right eval Left eval R  07/10/2024 L  07/10/2024  Shoulder flexion [facility] WFL  Shoulder extension [facility] WFL  Shoulder abduction WFL [facility] WFL EFL  Shoulder adduction      Shoulder functional internal rotation L2 P in R shoulder T9 P in L shoulder T10 T7  Shoulder functional external rotation Mid clavicle P in R arm to mid brachium Mid clavicle P in L shoulder C6  C6   Elbow flexion      Elbow extension      Wrist flexion      Wrist extension      Wrist ulnar deviation      Wrist radial deviation      Wrist pronation      Wrist supination      (Blank rows = not tested)  UPPER EXTREMITY MMT:  MMT Right  eval Left eval  Shoulder flexion 4+/5 4+/5  Shoulder extension 4+/5 4+/5  Shoulder abduction 4/5 4/5  Shoulder adduction 4+/5 P in R shoulder 4+/5 P in L shoulder  Shoulder internal rotation 4+/5 4+/5  Shoulder external rotation 4/5 4/5  Middle trapezius    Lower trapezius    Elbow flexion    Elbow extension    Wrist flexion    Wrist extension    Wrist ulnar deviation    Wrist radial deviation    Wrist pronation    Wrist supination    Grip strength (lbs)    (Blank rows = not tested)   PALPATION:  07/10/2024 - Inc TTP and resting tension of bilateral upper traps, rhomboids, and levator scap                                                                                                                             TREATMENT DATE:   [facility] Adult PT Treatment:                                                 DATE: 07/10/2024   Therapeutic Activity:  Tests and measures  Manual:  Active release of R upper trap (rotation and side bending - sb better)  [facility] Adult PT Treatment:                                                DATE: 06/27/24 Therapeutic Exercise: Seated hamstring isometrics 10x10s BLE Seated physioball press x30, 3 sec hold  Physioball wall walks 2x10 with anterior lean in BUE flexion Overhead lat stretch 3x15 with dowel  Lower trunk rotation 2x15 bilaterally, 2-3 sec hold, gait belt around knees  Shoulder shrug/antishrug 2x10 in sitting   [facility] Adult PT Treatment:                                                DATE: 06/22/24 Therapeutic Exercise: UBE x6 mins seat position 10 forwards  Thoracic spine extension in sitting 2x15 with small ball fulcrum, notes inc c/s pain in third set Repeated cervical spine retraction in sitting x10: dec, better symptoms of left c/s pain about C2-4 in right side bending  X10 additional: continues to note improvements  X10 additional: dec better, ROM improved to nil loss form 25% loss Manual Therapy: Manual soft tissue mobilization with pt seated to cervicothoracic region with inc attention to the upper trap, levator scap, and teres major muscle bellies noting inc adhesions and resting tension which improves as intervention progresses. Trigger point release to R  upper trap at junction x90s well tolerated, reduction noted, unable to attain full release   [facility] Adult PT Treatment:                                                DATE: 06/20/24 Therapeutic Exercise: Nustep level 4 x 5 mins Upper trap stretch BIL 30 Supine dowel flexion 2x10 Seated horizontal abduction RTB 2x10 Seated ER RTB x10 (some pain on L) 1000g ball on wall CW/CCW 30 BIL Pball roll out fwd/lat x10 ea Therapeutic Activity: Standing rows GTB 2x10 Shoulder extension GTB 2x10 Standing press out BTB 2x10 BIL  [facility] Adult PT Treatment:                                                 DATE: 06/15/2024   Therapeutic Exercise Seated dowel flexion x 10  Seated B ER YTB x 10  Seated horizontal ABD YTB x 10  Seated bicep curl YTB x 15 B  Fwd and lateral ball rolls x 10 each  Rows GTB 2x10 Shoulder ext RTB 2x10  Standing chest press BTB 2x10  [facility] Adult PT Treatment:                                                DATE: 05/31/24 Self Care: Pt educated on PT POC, relevant anatomy, physiology, pathology, diagnosis, prognosis, progression of care, pain and activity modification related to symptoms   PATIENT EDUCATION: Education details: Pt educated on relevant anatomy, physiology, pathology, diagnosis, prognosis, progression of care, pain and activity modification related to upper quarter pain Person educated: Patient Education method: Explanation, Demonstration, and Handouts Education comprehension: verbalized understanding and returned demonstration  HOME EXERCISE PROGRAM: Access Code: [email or web address] URL: [email or web address] Date: 06/22/2024 Prepared by: Stann Ohara  Exercises - Standing 'L' Stretch at Counter  - 1 x daily - 7 x weekly - 1 sets - 3 reps - 30sec hold - Standing Shoulder Row with Anchored Resistance  - 1 x daily - 7 x weekly - 3 sets - 10 reps - Shoulder extension with resistance - Neutral  - 1 x daily - 4 x weekly - 3 sets - 10 reps - Standing Shoulder Horizontal Abduction with Resistance  - 1 x daily - 4 x weekly - 3 sets - 10 reps - Seated Shoulder Shrugs  - 1 x daily - 7 x weekly - 1 sets - 30 reps - 2 hold - Seated Upper Trapezius Stretch  - 1 x daily - 7 x weekly - 3 sets - 1 reps - 30s hold - Cervical Retraction with Overpressure  - 5 x daily - 7 x weekly - 1 sets - 10 reps - 2 hold  ASSESSMENT:  CLINICAL IMPRESSION:  Progress Note:   The pt has been seen for 6 physical therapy visits to address her thoracic, cervical, and B shoulder pain. The pt has made progress with cervical ROM, shoulder ROM, and functional use of the UE's.  She continues to have limitations with mobility of shoulders and spine as well as poor tolerance under load. Her ADLs are made  challenging for the aforementioned impairments. The pt will benefit from skilled physical therapy to decrease pain and increase function.    EVAL Patient is a 53 y.o. F who was seen today for physical therapy evaluation and treatment for bil shoulder pain, cervical spine pain, and thoracic spine pain. Pt symptoms likely have a cervical component due to degree of bil UE ROM and lack of symptoms, symmetry, and the presence of thoracic spine pain. Pt is not currently working but is limited in her day to day activities, especially with UE use under weight and repetitive tasks. Symptoms in the cervical spine are consistent with decreased tolerance to facet closing moment L>R.  Pt stands to benefit from skilled physical therapy to address deficit areas and restore safety and improve tolerance with activities and participations at home and in the community.    OBJECTIVE IMPAIRMENTS: decreased activity tolerance, decreased ROM, decreased strength, increased fascial restrictions, impaired perceived functional ability, increased muscle spasms, impaired UE functional use, obesity, and pain.   ACTIVITY LIMITATIONS: carrying, lifting, sleeping, and reach over head  PARTICIPATION LIMITATIONS: meal prep, cleaning, laundry, community activity, and yard work  PERSONAL FACTORS: Time since onset of injury/illness/exacerbation and 3+ comorbidities: obesity, anemia, arthritis are also affecting patient's functional outcome.   REHAB POTENTIAL: Good  CLINICAL DECISION MAKING: Stable/uncomplicated  EVALUATION COMPLEXITY: Low  GOALS: Goals reviewed with patient? Yes  SHORT TERM GOALS: Target date: 06/30/24   Pt will report compliance with HEP to work towards ind and home management strategies Baseline: Goal status: MET   2.  Pt will score no greater than 7/50 on NDI to demonstrate  improved activity tolerance Baseline: 17/50 07/10/24: 9/50 Goal status: progressing   3.  Pt will improve cervical and shoulder AROM to full and painless in order to demonstrate progress towards activity tolerance and improved function Baseline: see ROM chart 07/10/2024: see objective Goal status: progressing     LONG TERM GOALS: Target date: 09/04/24   Pt will score no greater than 0/50 on NDI to demonstrate improved activity tolerance Baseline: 17/50 Goal status: INITIAL   2.  Pt will report no greater than 0/10 pain over 7 consecutive days to demonstrate maintained reduction in symptoms and improved tolerance to activity Baseline:  Goal status: INITIAL   3.  Pt will be ind in the management of their symptoms at home and in the community Baseline:  Goal status: INITIAL     PLAN: PT FREQUENCY: 1-2x/week  PT DURATION: 8 weeks  PLANNED INTERVENTIONS: 97110-Therapeutic exercises, 97530- Therapeutic activity, [id number]- Neuromuscular re-education, 97535- Self Care, 02859- Manual therapy, G0283- Electrical stimulation (unattended), 20560 (1-2 muscles), 20561 (3+ muscles)- Dry Needling, Patient/Family education, Cryotherapy, and Moist heat  For all possible CPT codes, reference the Planned Interventions line above.     Check all conditions that are expected to impact treatment: {Conditions expected to impact treatment:Morbid obesity and Social determinants of health   If treatment provided at initial evaluation, no treatment charged due to lack of authorization.       PLAN FOR NEXT SESSION: Upper quarter strength, stability, endurance, mobility, motor control through functional; movement patterns, assess repeated end range movements to cervical spine, shoulders, and or thoracic spine as indicated, establish and modify HEP as indicated   Marijo DELENA Berber, PT 07/10/2024, 10:42 AM  "

## 2024-07-11 NOTE — Addendum Note (Signed)
 Addended by: NYLE BARER A on: 07/11/2024 09:46 AM   Modules accepted: Orders

## 2024-07-17 ENCOUNTER — Ambulatory Visit: Attending: Family Medicine | Admitting: Physical Therapy

## 2024-07-17 ENCOUNTER — Telehealth: Payer: Self-pay | Admitting: Physical Therapy

## 2024-07-17 DIAGNOSIS — G8929 Other chronic pain: Secondary | ICD-10-CM | POA: Insufficient documentation

## 2024-07-17 DIAGNOSIS — M542 Cervicalgia: Secondary | ICD-10-CM | POA: Insufficient documentation

## 2024-07-17 DIAGNOSIS — M546 Pain in thoracic spine: Secondary | ICD-10-CM | POA: Insufficient documentation

## 2024-07-17 DIAGNOSIS — M6281 Muscle weakness (generalized): Secondary | ICD-10-CM | POA: Insufficient documentation

## 2024-07-17 DIAGNOSIS — M25512 Pain in left shoulder: Secondary | ICD-10-CM | POA: Insufficient documentation

## 2024-07-17 DIAGNOSIS — M25511 Pain in right shoulder: Secondary | ICD-10-CM | POA: Insufficient documentation

## 2024-07-17 DIAGNOSIS — M5459 Other low back pain: Secondary | ICD-10-CM | POA: Insufficient documentation

## 2024-07-17 NOTE — Telephone Encounter (Signed)
 Pt out of town and unable to make 1/6 or 1/8 PT appts. Pt able to reschedule to 1/9 PT appt.   Stann Ohara PT, DPT, CLT, CES 07/17/2024 10:21 AM

## 2024-07-19 ENCOUNTER — Ambulatory Visit: Admitting: Physical Therapy

## 2024-07-20 ENCOUNTER — Ambulatory Visit: Admitting: Physical Therapy

## 2024-07-20 ENCOUNTER — Encounter: Payer: Self-pay | Admitting: Physical Therapy

## 2024-07-20 DIAGNOSIS — M546 Pain in thoracic spine: Secondary | ICD-10-CM

## 2024-07-20 DIAGNOSIS — G8929 Other chronic pain: Secondary | ICD-10-CM

## 2024-07-20 DIAGNOSIS — M5459 Other low back pain: Secondary | ICD-10-CM | POA: Diagnosis present

## 2024-07-20 DIAGNOSIS — M6281 Muscle weakness (generalized): Secondary | ICD-10-CM | POA: Diagnosis present

## 2024-07-20 DIAGNOSIS — M542 Cervicalgia: Secondary | ICD-10-CM

## 2024-07-20 DIAGNOSIS — M25512 Pain in left shoulder: Secondary | ICD-10-CM | POA: Diagnosis present

## 2024-07-20 DIAGNOSIS — M25511 Pain in right shoulder: Secondary | ICD-10-CM | POA: Diagnosis present

## 2024-07-20 NOTE — Therapy (Signed)
 " OUTPATIENT PHYSICAL THERAPY TREATMENT     Patient Name: Christine Oneal MRN: [id number] DOB:March 23, 1971, 54 y.o., female Today's Date: 07/20/2024  END OF SESSION:  PT End of Session - 07/20/24 1127     Visit Number 7    Number of Visits 10    Date for Recertification  08/15/24    Authorization Type [place] medicaid [place]    Authorization Time Period 1/6-08/15/24    Authorization - Visit Number 7    Authorization - Number of Visits 10    Progress Note Due on Visit 10    PT Start Time 1130    PT Stop Time 1215    PT Time Calculation (min) 45 min    Activity Tolerance Patient tolerated treatment well    Behavior During Therapy [facility] for tasks assessed/performed            Past Medical History:  Diagnosis Date   Abnormal Pap smear of cervix    Allergy    Anemia    Arthritis    Atrial fibrillation (HCC)    CHF (congestive heart failure) (HCC)    Dysrhythmia    A fib   Family history of adverse reaction to anesthesia    Fibroid    History of atrial fibrillation    History of kidney stones    Hyperparathyroidism    Hypertension    Kidney stones    Leukocytosis, unspecified 10/18/2013   Obesity    Plantar fasciitis    Pneumonia    Past Surgical History:  Procedure Laterality Date   ATRIAL FIBRILLATION ABLATION N/A 08/03/2022   Procedure: ATRIAL FIBRILLATION ABLATION;  Surgeon: Cindie Ole DASEN, MD;  Location: [facility] INVASIVE CV LAB;  Service: Cardiovascular;  Laterality: N/A;   CARDIOVERSION N/A 05/12/2021   Procedure: CARDIOVERSION;  Surgeon: Cherrie Toribio JONELLE, MD;  Location: [facility] ENDOSCOPY;  Service: Cardiovascular;  Laterality: N/A;   CARDIOVERSION N/A 05/29/2021   Procedure: CARDIOVERSION;  Surgeon: Cherrie Toribio JONELLE, MD;  Location: [facility] ENDOSCOPY;  Service: Cardiovascular;  Laterality: N/A;   COLPOSCOPY     PARATHYROIDECTOMY N/A 01/17/2023   Procedure: NECK EXPLORATION WITH PARATHYROIDECTOMY;  Surgeon: Eletha Boas, MD;  Location: [facility] ORS;  Service: General;  Laterality:  N/A;   RIGHT/LEFT HEART CATH AND CORONARY ANGIOGRAPHY N/A 05/11/2021   Procedure: RIGHT/LEFT HEART CATH AND CORONARY ANGIOGRAPHY;  Surgeon: Cherrie Toribio JONELLE, MD;  Location: [facility] INVASIVE CV LAB;  Service: Cardiovascular;  Laterality: N/A;   TEE WITHOUT CARDIOVERSION N/A 05/12/2021   Procedure: TRANSESOPHAGEAL ECHOCARDIOGRAM (TEE);  Surgeon: Cherrie Toribio JONELLE, MD;  Location: [facility] ENDOSCOPY;  Service: Cardiovascular;  Laterality: N/A;   TUBAL LIGATION  2001   Patient Active Problem List   Diagnosis Date Noted   Cervical radiculopathy, chronic 05/04/2024   Allergic rhinitis 03/14/2024   B12 deficiency 03/02/2023   Stage 3b chronic kidney disease (CKD) (HCC) 03/02/2023   Constipation 03/02/2023   Right foot pain 12/09/2022   Urinary incontinence 08/23/2022   Chronic pain of both knees 08/23/2022   Gait instability 08/23/2022   Dysmenorrhea 05/14/2022   Left foot pain 05/14/2022   Encounter for general adult medical examination with abnormal findings 04/17/2022   Primary hyperparathyroidism 11/25/2021   Chronic systolic CHF (congestive heart failure) (HCC) 05/26/2021   Unspecified atrial fibrillation (HCC) 05/04/2021   Anemia 09/04/2017   Morbid obesity (HCC) 09/03/2017   Reactive airway disease 09/03/2017   Vitamin D  deficiency 09/03/2017   Essential hypertension 10/24/2007    PCP: Almarie Cleveland, MD  REFERRING PROVIDER: Joane,  Artist RAMAN, MD  REFERRING DIAG:  Diagnosis  M25.511,G89.29,M25.512 (ICD-10-CM) - Chronic pain of both shoulders  M54.6,G89.29 (ICD-10-CM) - Chronic bilateral thoracic back pain    THERAPY DIAG:  Cervicalgia  Chronic right shoulder pain  Chronic left shoulder pain  Muscle weakness (generalized)  Pain in thoracic spine  Rationale for Evaluation and Treatment: Rehabilitation  ONSET DATE: RUE x2 months, LUE   SUBJECTIVE:                                                                                                                                                                                       SUBJECTIVE STATEMENT:  Pt reports 5/10 pain in her thoracic spine, worse yesterday than today, no MOI for inc. Reports compliance with HEP.    Progress Note: 07/10/24 Pt reports her mid back continues to bother her when standing prolonged periods of time, walking more than 10 mins, and exercising. Her neck is mostly an issue with sleep. The pts shoulders continue to feel sore especially with reaching but her pain alternates between arms. Overall she reports feeling 35% better. She wants to continue PT to avoid injections.    EVAL Pt states that she has been having chronic pain in both shoulders, R shoulder pain has been present for 2 months, wihtout MOI. LUE has been present similar time frame. Presence of arthritis complicates symptoms. Clem[place] about 1 month ago when she was walking down steps, landed on hands and knees, no head contact. Pt noted pain in Bil LE, R foot lateral border, pt states fx present and instructed to allow healing and modify activity, no formal restrictions. Thoracic spine pain inc with fall. Over the last 2 weeks, symptoms are overall worsening with symptoms reaching from the back to the front of the shoulder (bil). She reports symptoms range in intensity from 1/10-8/10 depending on her activity or position as well as time of day.  Hand dominance: Right  PERTINENT HISTORY: Evaluate and Treat for bilat shoulder pain, mid/low back pain 1-2 times per week for 4-6 weeks.   Decrease pain, increase strength, flexibility, function, and range of motion.  Modalities may include, traction, ionto, phono, stim, and dry needling prn.  PAIN:  Are you having pain? Yes: NPRS scale: LUE 0/10, RUE 2/10, 0/10 thoracic spine Pain location: R shoulder, L shoulder, mid thoracic spine Pain description: sche, tight, sore Aggravating factors: reaching, lifting Relieving factors: rest  PRECAUTIONS: None  RED FLAGS: None   WEIGHT  BEARING RESTRICTIONS: No  FALLS:  Has patient fallen in last 6 months? Yes. Number of falls 1  LIVING ENVIRONMENT: Lives with: lives with their family Lives in: House/apartment Stairs: Yes:  External: 2 steps; on left going up Has following equipment at home: None  OCCUPATION: Not working   PLOF: Independent  PATIENT GOALS: to be able to back to normal   NEXT MD VISIT: 06/01/24  OBJECTIVE:  Note: Objective measures were completed at Evaluation unless otherwise noted.  DIAGNOSTIC FINDINGS:  Thoracic spine 05/16/24: Degenerative change without acute abnormality.  L shoulder 05/16/24: No acute fracture or dislocation is noted. Underlying bony thorax appears within normal limits. No soft tissue abnormality is seen. Mild degenerative changes of the acromioclavicular joint are noted.  IMPRESSION: No acute abnormality noted.   PATIENT SURVEYS :  NDI:  NECK DISABILITY INDEX   Date: 05/31/24 Score 07/10/2024  Pain intensity 2 = The pain is moderate at the moment 1  2. Personal care (washing, dressing, etc.) 1 =  I can look after myself normally but it causes extra pain 0  3. Lifting 4 =  I can only lift very light weights 4  4. Reading 0 = I can read as much as I want to with no pain in my neck 0  5. Headaches 1 =  I have slight headaches, which come infrequently 1  6. Concentration 0 =  I can concentrate fully when I want to with no difficulty 0  7. Work 2 = I can do most of my usual work, but no more 2  8. Driving 1 =  I can drive my car as long as I want with slight pain in my neck 0  9. Sleeping 2 = My sleep is mildly disturbed (1-2 hrs sleepless) 1  10. Recreation 4 =  I can hardly do any recreation activities because of pain in my neck 0  Total 17/50 9/50   Minimum Detectable Change (90% confidence): 5 points or 10% points  COGNITION: Overall cognitive status: Within functional limits for tasks assessed     SENSATION: WFL  POSTURE: Forward head, rounded shoulders,  inc lumbar lordosis, inc thoracic kyphosis  CERVICAL SPINE ROM:  05/31/24    Flexion: nil loss, tightness in upper c/s    Extension: nil loss, R side c/s inc pain    Side bend R: nil loss, L sided stretch     Side bend L: 25% loss, R sided pull, L sided c/s pain   Rot R:  25% loss, R mid c/s Rot L:  nil loss, upper L c/s pain 07/10/24    Flexion: nil loss   Extension: nil loss, R side c/s inc pain    Side bend R: 25% loss, R side c/s inc pain     Side bend L: nil loss, R sided pull  Rot R:  25% loss, R mid c/s inc pain Rot L:  nil loss, upper R c/s pain  UPPER EXTREMITY ROM:   Active ROM Right eval Left eval R  07/10/2024 L  07/10/2024  Shoulder flexion [facility] WFL  Shoulder extension [facility] WFL  Shoulder abduction WFL [facility] WFL EFL  Shoulder adduction      Shoulder functional internal rotation L2 P in R shoulder T9 P in L shoulder T10 T7  Shoulder functional external rotation Mid clavicle P in R arm to mid brachium Mid clavicle P in L shoulder C6  C6   Elbow flexion      Elbow extension      Wrist flexion      Wrist extension      Wrist ulnar deviation      Wrist  radial deviation      Wrist pronation      Wrist supination      (Blank rows = not tested)  UPPER EXTREMITY MMT:  MMT Right eval Left eval  Shoulder flexion 4+/5 4+/5  Shoulder extension 4+/5 4+/5  Shoulder abduction 4/5 4/5  Shoulder adduction 4+/5 P in R shoulder 4+/5 P in L shoulder  Shoulder internal rotation 4+/5 4+/5  Shoulder external rotation 4/5 4/5  Middle trapezius    Lower trapezius    Elbow flexion    Elbow extension    Wrist flexion    Wrist extension    Wrist ulnar deviation    Wrist radial deviation    Wrist pronation    Wrist supination    Grip strength (lbs)    (Blank rows = not tested)   PALPATION:  07/10/2024 - Inc TTP and resting tension of bilateral upper traps, rhomboids, and levator scap                                                                                                                              TREATMENT DATE:   [facility] Adult PT Treatment:                                                DATE: 07/20/24 Therapeutic Exercise: UBE x5 mins FW Thoracic spine extension in sitting with small ball 3x15 Shrug/antishrug with scap retraction 3x10 Force progression-repeated cervical retraction with pt overpressure and extension x10 in sitting: dec, better X10 additional reps: dec, better Manual Therapy: Suboccipital release x5 mins in supine, release noted and pt reports improvement following     [facility] Adult PT Treatment:                                                DATE: 07/10/2024   Therapeutic Activity:  Tests and measures  Manual:  Active release of R upper trap (rotation and side bending - sb better)  [facility] Adult PT Treatment:                                                DATE: 06/27/24 Therapeutic Exercise: Seated hamstring isometrics 10x10s BLE Seated physioball press x30, 3 sec hold  Physioball wall walks 2x10 with anterior lean in BUE flexion Overhead lat stretch 3x15 with dowel  Lower trunk rotation 2x15 bilaterally, 2-3 sec hold, gait belt around knees  Shoulder shrug/antishrug 2x10 in sitting   [facility] Adult PT Treatment:  DATE: 06/22/24 Therapeutic Exercise: UBE x6 mins seat position 10 forwards  Thoracic spine extension in sitting 2x15 with small ball fulcrum, notes inc c/s pain in third set Repeated cervical spine retraction in sitting x10: dec, better symptoms of left c/s pain about C2-4 in right side bending  X10 additional: continues to note improvements  X10 additional: dec better, ROM improved to nil loss form 25% loss Manual Therapy: Manual soft tissue mobilization with pt seated to cervicothoracic region with inc attention to the upper trap, levator scap, and teres major muscle bellies noting inc adhesions and resting tension which improves as intervention  progresses. Trigger point release to R upper trap at junction x90s well tolerated, reduction noted, unable to attain full release   [facility] Adult PT Treatment:                                                DATE: 06/20/24 Therapeutic Exercise: Nustep level 4 x 5 mins Upper trap stretch BIL 30 Supine dowel flexion 2x10 Seated horizontal abduction RTB 2x10 Seated ER RTB x10 (some pain on L) 1000g ball on wall CW/CCW 30 BIL Pball roll out fwd/lat x10 ea Therapeutic Activity: Standing rows GTB 2x10 Shoulder extension GTB 2x10 Standing press out BTB 2x10 BIL  [facility] Adult PT Treatment:                                                DATE: 06/15/2024   Therapeutic Exercise Seated dowel flexion x 10  Seated B ER YTB x 10  Seated horizontal ABD YTB x 10  Seated bicep curl YTB x 15 B  Fwd and lateral ball rolls x 10 each  Rows GTB 2x10 Shoulder ext RTB 2x10  Standing chest press BTB 2x10  [facility] Adult PT Treatment:                                                DATE: 05/31/24 Self Care: Pt educated on PT POC, relevant anatomy, physiology, pathology, diagnosis, prognosis, progression of care, pain and activity modification related to symptoms   PATIENT EDUCATION: Education details: Pt educated on relevant anatomy, physiology, pathology, diagnosis, prognosis, progression of care, pain and activity modification related to upper quarter pain Person educated: Patient Education method: Explanation, Demonstration, and Handouts Education comprehension: verbalized understanding and returned demonstration  HOME EXERCISE PROGRAM: Access Code: [email or web address] URL: [email or web address] Date: 07/20/2024 Prepared by: Stann Ohara  Exercises - Standing 'L' Stretch at Counter  - 1 x daily - 7 x weekly - 1 sets - 3 reps - 30sec hold - Standing Shoulder Row with Anchored Resistance  - 1 x daily - 7 x weekly - 3 sets - 10 reps - Shoulder extension with resistance - Neutral  - 1 x daily - 4 x  weekly - 3 sets - 10 reps - Standing Shoulder Horizontal Abduction with Resistance  - 1 x daily - 4 x weekly - 3 sets - 10 reps - Seated Shoulder Shrugs  - 1 x daily - 7 x weekly - 1 sets - 30 reps - 2 hold - Seated  Upper Trapezius Stretch  - 1 x daily - 7 x weekly - 3 sets - 1 reps - 30s hold - Seated Cervical Retraction Extension Passive Loaded  - 4-5 x daily - 7 x weekly - 1 sets - 10 reps - 2 hold - Supine Suboccipital Release with Tennis Balls  - 1-4 x daily - 7 x weekly - 1 sets - 1 reps - 5 min hold  ASSESSMENT:  CLINICAL IMPRESSION:  Pt tolerated session well and was able to address end range movements to the thoracic spine and cervical spine with force progression to repeated cervical movements showing improved benefit with this. Pt reports pain and tightness in upper c/s, manual techniques improve symptoms and she reports 3/10 pain leaving today. Pt stands to benefit from continued skilled physical therapy to address deficit areas and restore safety with activities and participations at home and in the community.    Progress Note:  07/10/24 The pt has been seen for 6 physical therapy visits to address her thoracic, cervical, and B shoulder pain. The pt has made progress with cervical ROM, shoulder ROM, and functional use of the UE's. She continues to have limitations with mobility of shoulders and spine as well as poor tolerance under load. Her ADLs are made challenging for the aforementioned impairments. The pt will benefit from skilled physical therapy to decrease pain and increase function.    EVAL Patient is a 54 y.o. F who was seen today for physical therapy evaluation and treatment for bil shoulder pain, cervical spine pain, and thoracic spine pain. Pt symptoms likely have a cervical component due to degree of bil UE ROM and lack of symptoms, symmetry, and the presence of thoracic spine pain. Pt is not currently working but is limited in her day to day activities, especially with UE  use under weight and repetitive tasks. Symptoms in the cervical spine are consistent with decreased tolerance to facet closing moment L>R.  Pt stands to benefit from skilled physical therapy to address deficit areas and restore safety and improve tolerance with activities and participations at home and in the community.    OBJECTIVE IMPAIRMENTS: decreased activity tolerance, decreased ROM, decreased strength, increased fascial restrictions, impaired perceived functional ability, increased muscle spasms, impaired UE functional use, obesity, and pain.   ACTIVITY LIMITATIONS: carrying, lifting, sleeping, and reach over head  PARTICIPATION LIMITATIONS: meal prep, cleaning, laundry, community activity, and yard work  PERSONAL FACTORS: Time since onset of injury/illness/exacerbation and 3+ comorbidities: obesity, anemia, arthritis are also affecting patient's functional outcome.   REHAB POTENTIAL: Good  CLINICAL DECISION MAKING: Stable/uncomplicated  EVALUATION COMPLEXITY: Low  GOALS: Goals reviewed with patient? Yes  SHORT TERM GOALS: Target date: 06/30/24   Pt will report compliance with HEP to work towards ind and home management strategies Baseline: Goal status: MET   2.  Pt will score no greater than 7/50 on NDI to demonstrate improved activity tolerance Baseline: 17/50 07/10/24: 9/50 Goal status: progressing   3.  Pt will improve cervical and shoulder AROM to full and painless in order to demonstrate progress towards activity tolerance and improved function Baseline: see ROM chart 07/10/2024: see objective Goal status: progressing     LONG TERM GOALS: Target date: 09/04/24   Pt will score no greater than 0/50 on NDI to demonstrate improved activity tolerance Baseline: 17/50 Goal status: INITIAL   2.  Pt will report no greater than 0/10 pain over 7 consecutive days to demonstrate maintained reduction in symptoms and improved tolerance to  activity Baseline:  Goal status:  INITIAL   3.  Pt will be ind in the management of their symptoms at home and in the community Baseline:  Goal status: INITIAL     PLAN: PT FREQUENCY: 1-2x/week  PT DURATION: 8 weeks  PLANNED INTERVENTIONS: 97110-Therapeutic exercises, 97530- Therapeutic activity, [id number]- Neuromuscular re-education, 97535- Self Care, 02859- Manual therapy, G0283- Electrical stimulation (unattended), 20560 (1-2 muscles), 20561 (3+ muscles)- Dry Needling, Patient/Family education, Cryotherapy, and Moist heat  For all possible CPT codes, reference the Planned Interventions line above.     Check all conditions that are expected to impact treatment: {Conditions expected to impact treatment:Morbid obesity and Social determinants of health   If treatment provided at initial evaluation, no treatment charged due to lack of authorization.       PLAN FOR NEXT SESSION: Upper quarter strength, stability, endurance, mobility, motor control through functional; movement patterns, assess repeated end range movements to cervical spine, shoulders, and or thoracic spine as indicated, establish and modify HEP as indicated   Stann DELENA Ohara, PT 07/20/2024, 12:18 PM  "

## 2024-07-24 ENCOUNTER — Encounter: Payer: Self-pay | Admitting: Physical Therapy

## 2024-07-24 ENCOUNTER — Ambulatory Visit: Admitting: Physical Therapy

## 2024-07-24 DIAGNOSIS — G8929 Other chronic pain: Secondary | ICD-10-CM

## 2024-07-24 DIAGNOSIS — M546 Pain in thoracic spine: Secondary | ICD-10-CM

## 2024-07-24 DIAGNOSIS — M542 Cervicalgia: Secondary | ICD-10-CM | POA: Diagnosis not present

## 2024-07-24 DIAGNOSIS — M6281 Muscle weakness (generalized): Secondary | ICD-10-CM

## 2024-07-24 NOTE — Therapy (Signed)
 " OUTPATIENT PHYSICAL THERAPY TREATMENT     Patient Name: Christine Oneal MRN: [id number] DOB:October 23, 1970, 54 y.o., female Today's Date: 07/24/2024  END OF SESSION:  PT End of Session - 07/24/24 1005     Visit Number 8    Number of Visits 10    Date for Recertification  08/15/24    Authorization Type [place] med[place] prepaid    Authorization Time Period 1/6-08/15/24    Authorization - Visit Number 8    Authorization - Number of Visits 10    Progress Note Due on Visit 10    PT Start Time 1003    PT Stop Time 1045    PT Time Calculation (min) 42 min    Activity Tolerance Patient tolerated treatment well    Behavior During Therapy WFL for tasks assessed/performed             Past Medical History:  Diagnosis Date   Abnormal Pap smear of cervix    Allergy    Anemia    Arthritis    Atrial fibrillation (HCC)    CHF (congestive heart failure) (HCC)    Dysrhythmia    A fib   Family history of adverse reaction to anesthesia    Fibroid    History of atrial fibrillation    History of kidney stones    Hyperparathyroidism    Hypertension    Kidney stones    Leukocytosis, unspecified 10/18/2013   Obesity    Plantar fasciitis    Pneumonia    Past Surgical History:  Procedure Laterality Date   ATRIAL FIBRILLATION ABLATION N/A 08/03/2022   Procedure: ATRIAL FIBRILLATION ABLATION;  Surgeon: Cindie Ole DASEN, MD;  Location: [facility];  Service: Cardiovascular;  Laterality: N/A;   CARDIOVERSION N/A 05/12/2021   Procedure: CARDIOVERSION;  Surgeon: Cherrie Toribio JONELLE, MD;  Location: [facility] ENDOSCOPY;  Service: Cardiovascular;  Laterality: N/A;   CARDIOVERSION N/A 05/29/2021   Procedure: CARDIOVERSION;  Surgeon: Cherrie Toribio JONELLE, MD;  Location: [facility] ENDOSCOPY;  Service: Cardiovascular;  Laterality: N/A;   COLPOSCOPY     PARATHYROIDECTOMY N/A 01/17/2023   Procedure: NECK EXPLORATION WITH PARATHYROIDECTOMY;  Surgeon: Eletha Boas, MD;  Location: [facility] ORS;  Service: General;   Laterality: N/A;   RIGHT/LEFT HEART CATH AND CORONARY ANGIOGRAPHY N/A 05/11/2021   Procedure: RIGHT/LEFT HEART CATH AND CORONARY ANGIOGRAPHY;  Surgeon: Cherrie Toribio JONELLE, MD;  Location: [facility] IN[facility] CV [facility];  Service: Cardiovascular;  Laterality: N/A;   TEE WITHOUT CARDIOVERSION N/A 05/12/2021   Procedure: TRANSESOPHAGEAL ECHOCARDIOGRAM (TEE);  Surgeon: Cherrie Toribio JONELLE, MD;  Location: [facility] ENDOSCOPY;  Service: Cardiovascular;  Laterality: N/A;   TUBAL LIGATION  2001   Patient Active Problem List   Diagnosis Date Noted   Cervical radiculopathy, chronic 05/04/2024   Allergic rhinitis 03/14/2024   B12 deficiency 03/02/2023   Stage 3b chronic kidney disease (CKD) (HCC) 03/02/2023   Constipation 03/02/2023   Right foot pain 12/09/2022   Urinary incontinence 08/23/2022   Chronic pain of both knees 08/23/2022   Gait instability 08/23/2022   Dysmenorrhea 05/14/2022   Left foot pain 05/14/2022   Encounter for general adult medical examination with abnormal findings 04/17/2022   Primary hyperparathyroidism 11/25/2021   Chronic systolic CHF (congestive heart failure) (HCC) 05/26/2021   Unspecified atrial fibrillation (HCC) 05/04/2021   Anemia 09/04/2017   Morbid obesity (HCC) 09/03/2017   Reactive airway disease 09/03/2017   Vitamin D  deficiency 09/03/2017   Essential hypertension 10/24/2007    PCP: Almarie Cleveland, MD  REFERRING PROVIDER:  Joane Artist RAMAN, MD  REFERRING DIAG:  Diagnosis  M25.511,G89.29,M25.512 (ICD-10-CM) - Chronic pain of both shoulders  M54.6,G89.29 (ICD-10-CM) - Chronic bilateral thoracic back pain    THERAPY DIAG:  Cervicalgia  Chronic right shoulder pain  Chronic left shoulder pain  Muscle weakness (generalized)  Pain in thoracic spine  Rationale for Evaluation and Treatment: Rehabilitation  ONSET DATE: RUE x2 months, LUE   SUBJECTIVE:                                                                                                                                                                                       SUBJECTIVE STATEMENT:  Pt states that she has been doing well since her previous session, onset knee pain yesterday in both knees and pain in mid back, feels progression to retraction with extension in the c/s improves symptoms with completion. Has been completing BID, okay to progress to inc frequency every 2-3 hours or prn whichever comes first. Pain rated as mild 2/10 in left shoulder.    Progress Note: 07/10/24 Pt reports her mid back continues to bother her when standing prolonged periods of time, walking more than 10 mins, and exercising. Her neck is mostly an issue with sleep. The pts shoulders continue to feel sore especially with reaching but her pain alternates between arms. Overall she reports feeling 35% better. She wants to continue PT to avoid injections.    EVAL Pt states that she has been having chronic pain in both shoulders, R shoulder pain has been present for 2 months, wihtout MOI. LUE has been present similar time frame. Presence of arthritis complicates symptoms. [place] about 1 month ago when she was walking down steps, landed on hands and knees, no head contact. Pt noted pain in Bil LE, R foot lateral border, pt states fx present and instructed to allow healing and modify activity, no formal restrictions. Thoracic spine pain inc with fall. Over the last 2 weeks, symptoms are overall worsening with symptoms reaching from the back to the front of the shoulder (bil). She reports symptoms range in intensity from 1/10-8/10 depending on her activity or position as well as time of day.  Hand dominance: Right  PERTINENT HISTORY: Evaluate and Treat for bilat shoulder pain, mid/low back pain 1-2 times per week for 4-6 weeks.   Decrease pain, increase strength, flexibility, function, and range of motion.  Modalities may include, traction, ionto, phono, stim, and dry needling prn.  PAIN:  Are you having pain? Yes: NPRS  scale: LUE 0/10, RUE 2/10, 0/10 thoracic spine Pain location: R shoulder, L shoulder, mid thoracic spine Pain description: sche, tight, sore Aggravating factors: reaching, lifting Relieving  factors: rest  PRECAUTIONS: None  RED FLAGS: None   WEIGHT BEARING RESTRICTIONS: No  FALLS:  Has patient fallen in last 6 months? Yes. Number of falls 1  LIVING ENVIRONMENT: Lives with: lives with their family Lives in: House/apartment Stairs: Yes: External: 2 steps; on left going up Has following equipment at home: None  OCCUPATION: Not working   PLOF: Independent  PATIENT GOALS: to be able to back to normal   NEXT MD VISIT: 06/01/24  OBJECTIVE:  Note: Objective measures were completed at Evaluation unless otherwise noted.  DIAGNOSTIC FINDINGS:  Thoracic spine 05/16/24: Degenerative change without acute abnormality.  L shoulder 05/16/24: No acute fracture or dislocation is noted. Underlying bony thorax appears within normal limits. No soft tissue abnormality is seen. Mild degenerative changes of the acromioclavicular joint are noted.  IMPRESSION: No acute abnormality noted.   PATIENT SURVEYS :  NDI:  NECK DISABILITY INDEX   Date: 05/31/24 Score 07/10/2024  Pain intensity 2 = The pain is moderate at the moment 1  2. Personal care (washing, dressing, etc.) 1 =  I can look after myself normally but it causes extra pain 0  3. Lifting 4 =  I can only lift very light weights 4  4. Reading 0 = I can read as much as I want to with no pain in my neck 0  5. Headaches 1 =  I have slight headaches, which come infrequently 1  6. Concentration 0 =  I can concentrate fully when I want to with no difficulty 0  7. Work 2 = I can do most of my usual work, but no more 2  8. Driving 1 =  I can drive my car as long as I want with slight pain in my neck 0  9. Sleeping 2 = My sleep is mildly disturbed (1-2 hrs sleepless) 1  10. Recreation 4 =  I can hardly do any recreation activities because of  pain in my neck 0  Total 17/50 9/50   Minimum Detectable Change (90% confidence): 5 points or 10% points  COGNITION: Overall cognitive status: Within functional limits for tasks assessed     SENSATION: WFL  POSTURE: Forward head, rounded shoulders, inc lumbar lordosis, inc thoracic kyphosis  CERVICAL SPINE ROM:  05/31/24    Flexion: nil loss, tightness in upper c/s    Extension: nil loss, R side c/s inc pain    Side bend R: nil loss, L sided stretch     Side bend L: 25% loss, R sided pull, L sided c/s pain   Rot R:  25% loss, R mid c/s Rot L:  nil loss, upper L c/s pain 07/10/24    Flexion: nil loss   Extension: nil loss, R side c/s inc pain    Side bend R: 25% loss, R side c/s inc pain     Side bend L: nil loss, R sided pull  Rot R:  25% loss, R mid c/s inc pain Rot L:  nil loss, upper R c/s pain  UPPER EXTREMITY ROM:   Active ROM Right eval Left eval R  07/10/2024 L  07/10/2024  Shoulder flexion [facility] WFL  Shoulder extension [facility] WFL  Shoulder abduction WFL [facility] WFL EFL  Shoulder adduction      Shoulder functional internal rotation L2 P in R shoulder T9 P in L shoulder T10 T7  Shoulder functional external rotation Mid clavicle P in R arm to mid brachium Mid clavicle P in  L shoulder C6  C6   Elbow flexion      Elbow extension      Wrist flexion      Wrist extension      Wrist ulnar deviation      Wrist radial deviation      Wrist pronation      Wrist supination      (Blank rows = not tested)  UPPER EXTREMITY MMT:  MMT Right eval Left eval  Shoulder flexion 4+/5 4+/5  Shoulder extension 4+/5 4+/5  Shoulder abduction 4/5 4/5  Shoulder adduction 4+/5 P in R shoulder 4+/5 P in L shoulder  Shoulder internal rotation 4+/5 4+/5  Shoulder external rotation 4/5 4/5  Middle trapezius    Lower trapezius    Elbow flexion    Elbow extension    Wrist flexion    Wrist extension    Wrist ulnar deviation    Wrist radial deviation    Wrist pronation     Wrist supination    Grip strength (lbs)    (Blank rows = not tested)   PALPATION:  07/10/2024 - Inc TTP and resting tension of bilateral upper traps, rhomboids, and levator scap                                                                                                                             TREATMENT DATE:   [facility] Adult PT Treatment:                                                DATE: 07/24/24 Therapeutic Exercise: UBE x6 mins, 2 mins FW 1 min BW x2 bouts, level 1 OH press x10, 3# LUE inc stiffness in L shoulder, cavitation noted UE PNF D2 pattern 3x10 BUE alternating between sets, improves stiffness noted from [facility] press Side lying LUE ER with 3# DB, 5x5reps Supported median nn glide BUE 3x15  Manual Therapy: IASTM completed to left upper trap distribution. Pt developed light redness consistent with treatment goals to improve efficiency of tissue remodeling principles. Small strokes completed in line with tissue orientation with inc time spent mid muscle belly of the upper trap with noted trigger point. Pt tolerated tx well, no adverse response. Minor soreness       [facility] Adult PT Treatment:                                                DATE: 07/20/24 Therapeutic Exercise: UBE x5 mins FW Thoracic spine extension in sitting with small ball 3x15 Shrug/antishrug with scap retraction 3x10 Force progression-repeated cervical retraction with pt overpressure and extension x10 in sitting: dec, better X10 additional reps: dec, better Manual Therapy: Suboccipital release  x5 mins in supine, release noted and pt reports improvement following     [facility] Adult PT Treatment:                                                DATE: 07/10/2024   Therapeutic Activity:  Tests and measures  Manual:  Active release of R upper trap (rotation and side bending - sb better)  [facility] Adult PT Treatment:                                                DATE: 06/27/24 Therapeutic Exercise: Seated  hamstring isometrics 10x10s BLE Seated physioball press x30, 3 sec hold  Physioball wall walks 2x10 with anterior lean in BUE flexion Overhead lat stretch 3x15 with dowel  Lower trunk rotation 2x15 bilaterally, 2-3 sec hold, gait belt around knees  Shoulder shrug/antishrug 2x10 in sitting   [facility] Adult PT Treatment:                                                DATE: 06/22/24 Therapeutic Exercise: UBE x6 mins seat position 10 forwards  Thoracic spine extension in sitting 2x15 with small ball fulcrum, notes inc c/s pain in third set Repeated cervical spine retraction in sitting x10: dec, better symptoms of left c/s pain about C2-4 in right side bending  X10 additional: continues to note improvements  X10 additional: dec better, ROM improved to nil loss form 25% loss Manual Therapy: Manual soft tissue mobilization with pt seated to cervicothoracic region with inc attention to the upper trap, levator scap, and teres major muscle bellies noting inc adhesions and resting tension which improves as intervention progresses. Trigger point release to R upper trap at junction x90s well tolerated, reduction noted, unable to attain full release   [facility] Adult PT Treatment:                                                DATE: 06/20/24 Therapeutic Exercise: Nustep level 4 x 5 mins Upper trap stretch BIL 30 Supine dowel flexion 2x10 Seated horizontal abduction RTB 2x10 Seated ER RTB x10 (some pain on L) 1000g ball on wall CW/CCW 30 BIL Pball roll out fwd/lat x10 ea Therapeutic Activity: Standing rows GTB 2x10 Shoulder extension GTB 2x10 Standing press out BTB 2x10 BIL    PATIENT EDUCATION: Education details: Pt educated on relevant anatomy, physiology, pathology, diagnosis, prognosis, progression of care, pain and activity modification related to upper quarter pain Person educated: Patient Education method: Explanation, Demonstration, and Handouts Education comprehension: verbalized  understanding and returned demonstration  HOME EXERCISE PROGRAM: Access Code: [email or web address] URL: [email or web address] Date: 07/20/2024 Prepared by: Stann Ohara  Exercises - Standing 'L' Stretch at Counter  - 1 x daily - 7 x weekly - 1 sets - 3 reps - 30sec hold - Standing Shoulder Row with Anchored Resistance  - 1 x daily - 7 x weekly - 3 sets - 10 reps - Shoulder extension  with resistance - Neutral  - 1 x daily - 4 x weekly - 3 sets - 10 reps - Standing Shoulder Horizontal Abduction with Resistance  - 1 x daily - 4 x weekly - 3 sets - 10 reps - Seated Shoulder Shrugs  - 1 x daily - 7 x weekly - 1 sets - 30 reps - 2 hold - Seated Upper Trapezius Stretch  - 1 x daily - 7 x weekly - 3 sets - 1 reps - 30s hold - Seated Cervical Retraction Extension Passive Loaded  - 4-5 x daily - 7 x weekly - 1 sets - 10 reps - 2 hold - Supine Suboccipital Release with Tennis Balls  - 1-4 x daily - 7 x weekly - 1 sets - 1 reps - 5 min hold  ASSESSMENT:  CLINICAL IMPRESSION:  Soft tissue mobilization completed consistent with tissue remodeling principles, pt follows a produced, not worse response, when stiffness or soreness remains, intervention is changed. Neural mobility integrated with median nn glides with good tolerance and response. Pt stands to benefit from continued skilled physical therapy to address deficit areas and restore safety with activities and participations at home and in the community.      Progress Note:  07/10/24 The pt has been seen for 6 physical therapy visits to address her thoracic, cervical, and B shoulder pain. The pt has made progress with cervical ROM, shoulder ROM, and functional use of the UE's. She continues to have limitations with mobility of shoulders and spine as well as poor tolerance under load. Her ADLs are made challenging for the aforementioned impairments. The pt will benefit from skilled physical therapy to decrease pain and increase function.     EVAL Patient is a 54 y.o. F who was seen today for physical therapy evaluation and treatment for bil shoulder pain, cervical spine pain, and thoracic spine pain. Pt symptoms likely have a cervical component due to degree of bil UE ROM and lack of symptoms, symmetry, and the presence of thoracic spine pain. Pt is not currently working but is limited in her day to day activities, especially with UE use under weight and repetitive tasks. Symptoms in the cervical spine are consistent with decreased tolerance to facet closing moment L>R.  Pt stands to benefit from skilled physical therapy to address deficit areas and restore safety and improve tolerance with activities and participations at home and in the community.    OBJECTIVE IMPAIRMENTS: decreased activity tolerance, decreased ROM, decreased strength, increased fascial restrictions, impaired perceived functional ability, increased muscle spasms, impaired UE functional use, obesity, and pain.   ACTIVITY LIMITATIONS: carrying, lifting, sleeping, and reach over head  PARTICIPATION LIMITATIONS: meal prep, cleaning, laundry, community activity, and yard work  PERSONAL FACTORS: Time since onset of injury/illness/exacerbation and 3+ comorbidities: obesity, anemia, arthritis are also affecting patient's functional outcome.   REHAB POTENTIAL: Good  CLINICAL DECISION MAKING: Stable/uncomplicated  EVALUATION COMPLEXITY: Low  GOALS: Goals reviewed with patient? Yes  SHORT TERM GOALS: Target date: 06/30/24   Pt will report compliance with HEP to work towards ind and home management strategies Baseline: Goal status: MET   2.  Pt will score no greater than 7/50 on NDI to demonstrate improved activity tolerance Baseline: 17/50 07/10/24: 9/50 Goal status: progressing   3.  Pt will improve cervical and shoulder AROM to full and painless in order to demonstrate progress towards activity tolerance and improved function Baseline: see ROM  chart 07/10/2024: see objective Goal status: progressing  LONG TERM GOALS: Target date: 09/04/24   Pt will score no greater than 0/50 on NDI to demonstrate improved activity tolerance Baseline: 17/50 Goal status: INITIAL   2.  Pt will report no greater than 0/10 pain over 7 consecutive days to demonstrate maintained reduction in symptoms and improved tolerance to activity Baseline:  Goal status: INITIAL   3.  Pt will be ind in the management of their symptoms at home and in the community Baseline:  Goal status: INITIAL     PLAN: PT FREQUENCY: 1-2x/week  PT DURATION: 8 weeks  PLANNED INTERVENTIONS: 97110-Therapeutic exercises, 97530- Therapeutic activity, [id number]- Neuromuscular re-education, 97535- Self Care, 02859- Manual therapy, G0283- Electrical stimulation (unattended), 20560 (1-2 muscles), 20561 (3+ muscles)- Dry Needling, Patient/Family education, Cryotherapy, and Moist heat  For all possible CPT codes, reference the Planned Interventions line above.     Check all conditions that are expected to impact treatment: {Conditions expected to impact treatment:Morbid obesity and Social determinants of health   If treatment provided at initial evaluation, no treatment charged due to lack of authorization.       PLAN FOR NEXT SESSION: Upper quarter strength, stability, endurance, mobility, motor control through functional; movement patterns, assess repeated end range movements to cervical spine, shoulders, and or thoracic spine as indicated, establish and modify HEP as indicated   Stann DELENA Ohara, PT 07/24/2024, 10:05 AM  "

## 2024-07-25 ENCOUNTER — Other Ambulatory Visit: Payer: Self-pay

## 2024-07-26 ENCOUNTER — Encounter: Payer: Self-pay | Admitting: Physical Therapy

## 2024-07-26 ENCOUNTER — Ambulatory Visit: Admitting: Physical Therapy

## 2024-07-26 DIAGNOSIS — M542 Cervicalgia: Secondary | ICD-10-CM

## 2024-07-26 DIAGNOSIS — M546 Pain in thoracic spine: Secondary | ICD-10-CM

## 2024-07-26 DIAGNOSIS — M6281 Muscle weakness (generalized): Secondary | ICD-10-CM

## 2024-07-26 DIAGNOSIS — G8929 Other chronic pain: Secondary | ICD-10-CM

## 2024-07-26 NOTE — Telephone Encounter (Signed)
 She has not followed up with me and she does not have any follow-up appointments.  So please forward this to her PCP.  On her last office visit I requested that she be back in 3 months.

## 2024-07-26 NOTE — Therapy (Signed)
 " OUTPATIENT PHYSICAL THERAPY TREATMENT     Patient Name: Christine Oneal MRN: [id number] DOB:12/13/1970, 54 y.o., female Today's Date: 07/26/2024  END OF SESSION:  PT End of Session - 07/26/24 1006     Visit Number 9    Number of Visits 10    Date for Recertification  08/15/24    Authorization Type [place] med[place]    Authorization Time Period 1/6-08/15/24    Authorization - Visit Number 9    Authorization - Number of Visits 10    Progress Note Due on Visit 10    PT Start Time 1004    PT Stop Time 1045    PT Time Calculation (min) 41 min    Activity Tolerance Patient tolerated treatment well    Behavior During Therapy WFL for tasks assessed/performed              Past Medical History:  Diagnosis Date   Abnormal Pap smear of cervix    Allergy    Anemia    Arthritis    Atrial fibrillation (HCC)    CHF (congestive heart failure) (HCC)    Dysrhythmia    A fib   Family history of adverse reaction to anesthesia    Fibroid    History of atrial fibrillation    History of kidney stones    Hyperparathyroidism    Hypertension    Kidney stones    Leukocytosis, unspecified 10/18/2013   Obesity    Plantar fasciitis    Pneumonia    Past Surgical History:  Procedure Laterality Date   ATRIAL FIBRILLATION ABLATION N/A 08/03/2022   Procedure: ATRIAL FIBRILLATION ABLATION;  Surgeon: Cindie Ole DASEN, MD;  Location: [facility] IN[facility]ASIVE [facility];  Service: Cardiovascular;  Laterality: N/A;   CARDIOVERSION N/A 05/12/2021   Procedure: CARDIOVERSION;  Surgeon: Cherrie Toribio JONELLE, MD;  Location: [facility] ENDOSCOPY;  Service: Cardiovascular;  Laterality: N/A;   CARDIOVERSION N/A 05/29/2021   Procedure: CARDIOVERSION;  Surgeon: Cherrie Toribio JONELLE, MD;  Location: [facility] ENDOSCOPY;  Service: Cardiovascular;  Laterality: N/A;   COLPOSCOPY     PARATHYROIDECTOMY N/A 01/17/2023   Procedure: NECK EXPLORATION WITH PARATHYROIDECTOMY;  Surgeon: Eletha Boas, MD;  Location: [facility] ORS;  Service: General;   Laterality: N/A;   RIGHT/LEFT HEART CATH AND CORONARY ANGIOGRAPHY N/A 05/11/2021   Procedure: RIGHT/LEFT HEART CATH AND CORONARY ANGIOGRAPHY;  Surgeon: Cherrie Toribio JONELLE, MD;  Location: [facility] INVASIVE CV LAB;  Service: Cardiovascular;  Laterality: N/A;   TEE WITHOUT CARDIOVERSION N/A 05/12/2021   Procedure: TRANSESOPHAGEAL ECHOCARDIOGRAM (TEE);  Surgeon: Cherrie Toribio JONELLE, MD;  Location: [facility] ENDOSCOPY;  Service: Cardiovascular;  Laterality: N/A;   TUBAL LIGATION  2001   Patient Active Problem List   Diagnosis Date Noted   Cervical radiculopathy, chronic 05/04/2024   Allergic rhinitis 03/14/2024   B12 deficiency 03/02/2023   Stage 3b chronic kidney disease (CKD) (HCC) 03/02/2023   Constipation 03/02/2023   Right foot pain 12/09/2022   Urinary incontinence 08/23/2022   Chronic pain of both knees 08/23/2022   Gait instability 08/23/2022   Dysmenorrhea 05/14/2022   Left foot pain 05/14/2022   Encounter for general adult medical examination with abnormal findings 04/17/2022   Primary hyperparathyroidism 11/25/2021   Chronic systolic CHF (congestive heart failure) (HCC) 05/26/2021   Unspecified atrial fibrillation (HCC) 05/04/2021   Anemia 09/04/2017   Morbid obesity (HCC) 09/03/2017   Reactive airway disease 09/03/2017   Vitamin D  deficiency 09/03/2017   Essential hypertension 10/24/2007    PCP: Almarie Cleveland, MD  REFERRING  PROVIDER: Joane Artist RAMAN, MD  REFERRING DIAG:  Diagnosis  M25.511,G89.29,M25.512 (ICD-10-CM) - Chronic pain of both shoulders  M54.6,G89.29 (ICD-10-CM) - Chronic bilateral thoracic back pain    THERAPY DIAG:  Cervicalgia  Chronic right shoulder pain  Chronic left shoulder pain  Muscle weakness (generalized)  Pain in thoracic spine  Rationale for Evaluation and Treatment: Rehabilitation  ONSET DATE: RUE x2 months, LUE   SUBJECTIVE:                                                                                                                                                                                       SUBJECTIVE STATEMENT:  Pt states that she has been noting benefit from PT sessions and home exercises. Pt remains limited in tolerance to prolonged positioning or with inc activity during the day. Symptoms in the mid back have been improving since beginning of the day, L shoulder pain in the mid and upper trap   Progress Note: 07/10/24 Pt reports her mid back continues to bother her when standing prolonged periods of time, walking more than 10 mins, and exercising. Her neck is mostly an issue with sleep. The pts shoulders continue to feel sore especially with reaching but her pain alternates between arms. Overall she reports feeling 35% better. She wants to continue PT to avoid injections.    EVAL Pt states that she has been having chronic pain in both shoulders, R shoulder pain has been present for 2 months, wihtout MOI. LUE has been present similar time frame. Presence of arthritis complicates symptoms. [place] about 1 month ago when she was walking down steps, landed on hands and knees, no head contact. Pt noted pain in Bil LE, R foot lateral border, pt states fx present and instructed to allow healing and modify activity, no formal restrictions. Thoracic spine pain inc with fall. Over the last 2 weeks, symptoms are overall worsening with symptoms reaching from the back to the front of the shoulder (bil). She reports symptoms range in intensity from 1/10-8/10 depending on her activity or position as well as time of day.  Hand dominance: Right  PERTINENT HISTORY: Evaluate and Treat for bilat shoulder pain, mid/low back pain 1-2 times per week for 4-6 weeks.   Decrease pain, increase strength, flexibility, function, and range of motion.  Modalities may include, traction, ionto, phono, stim, and dry needling prn.  PAIN:  Are you having pain? Yes: NPRS scale: LUE 0/10, RUE 2/10, 0/10 thoracic spine Pain location: R shoulder, L  shoulder, mid thoracic spine Pain description: sche, tight, sore Aggravating factors: reaching, lifting Relieving factors: rest  PRECAUTIONS: None  RED FLAGS: None   WEIGHT  BEARING RESTRICTIONS: No  FALLS:  Has patient fallen in last 6 months? Yes. Number of falls 1  LIVING ENVIRONMENT: Lives with: lives with their family Lives in: House/apartment Stairs: Yes: External: 2 steps; on left going up Has following equipment at home: None  OCCUPATION: Not working   PLOF: Independent  PATIENT GOALS: to be able to back to normal   NEXT MD VISIT: 06/01/24  OBJECTIVE:  Note: Objective measures were completed at Evaluation unless otherwise noted.  DIAGNOSTIC FINDINGS:  Thoracic spine 05/16/24: Degenerative change without acute abnormality.  L shoulder 05/16/24: No acute fracture or dislocation is noted. Underlying bony thorax appears within normal limits. No soft tissue abnormality is seen. Mild degenerative changes of the acromioclavicular joint are noted.  IMPRESSION: No acute abnormality noted.   PATIENT SURVEYS :  NDI:  NECK DISABILITY INDEX   Date: 05/31/24 Score 07/10/2024  Pain intensity 2 = The pain is moderate at the moment 1  2. Personal care (washing, dressing, etc.) 1 =  I can look after myself normally but it causes extra pain 0  3. Lifting 4 =  I can only lift very light weights 4  4. Reading 0 = I can read as much as I want to with no pain in my neck 0  5. Headaches 1 =  I have slight headaches, which come infrequently 1  6. Concentration 0 =  I can concentrate fully when I want to with no difficulty 0  7. Work 2 = I can do most of my usual work, but no more 2  8. Driving 1 =  I can drive my car as long as I want with slight pain in my neck 0  9. Sleeping 2 = My sleep is mildly disturbed (1-2 hrs sleepless) 1  10. Recreation 4 =  I can hardly do any recreation activities because of pain in my neck 0  Total 17/50 9/50   Minimum Detectable Change (90%  confidence): 5 points or 10% points  COGNITION: Overall cognitive status: Within functional limits for tasks assessed     SENSATION: WFL  POSTURE: Forward head, rounded shoulders, inc lumbar lordosis, inc thoracic kyphosis  CERVICAL SPINE ROM:  05/31/24    Flexion: nil loss, tightness in upper c/s    Extension: nil loss, R side c/s inc pain    Side bend R: nil loss, L sided stretch     Side bend L: 25% loss, R sided pull, L sided c/s pain   Rot R:  25% loss, R mid c/s Rot L:  nil loss, upper L c/s pain 07/10/24    Flexion: nil loss   Extension: nil loss, R side c/s inc pain    Side bend R: 25% loss, R side c/s inc pain     Side bend L: nil loss, R sided pull  Rot R:  25% loss, R mid c/s inc pain Rot L:  nil loss, upper R c/s pain  UPPER EXTREMITY ROM:   Active ROM Right eval Left eval R  07/10/2024 L  07/10/2024  Shoulder flexion [facility] WFL  Shoulder extension [facility] WFL  Shoulder abduction WFL [facility] WFL EFL  Shoulder adduction      Shoulder functional internal rotation L2 P in R shoulder T9 P in L shoulder T10 T7  Shoulder functional external rotation Mid clavicle P in R arm to mid brachium Mid clavicle P in L shoulder C6  C6   Elbow flexion  Elbow extension      Wrist flexion      Wrist extension      Wrist ulnar deviation      Wrist radial deviation      Wrist pronation      Wrist supination      (Blank rows = not tested)  UPPER EXTREMITY MMT:  MMT Right eval Left eval  Shoulder flexion 4+/5 4+/5  Shoulder extension 4+/5 4+/5  Shoulder abduction 4/5 4/5  Shoulder adduction 4+/5 P in R shoulder 4+/5 P in L shoulder  Shoulder internal rotation 4+/5 4+/5  Shoulder external rotation 4/5 4/5  Middle trapezius    Lower trapezius    Elbow flexion    Elbow extension    Wrist flexion    Wrist extension    Wrist ulnar deviation    Wrist radial deviation    Wrist pronation    Wrist supination    Grip strength (lbs)    (Blank rows = not  tested)   PALPATION:  07/10/2024 - Inc TTP and resting tension of bilateral upper traps, rhomboids, and levator scap                                                                                                                             TREATMENT DATE:   [facility] Adult PT Treatment:                                                DATE: 07/26/24  Therapeutic Activity: Discussed symptoms in depth identifying symptom changes with various movements included in HEP and pt participations/activities. Modifed HEP based on symptoms and risk benefit of each intervention. Discussed POC update to take place at next session, will trial new HEP and report back.  Review of rows in standing with elbows flexed, inc resistance for home progression, blue TB provided.  Suboccipital release with tennis balls x5 mins in supine to replicate positioning and response for home.    [facility] Adult PT Treatment:                                                DATE: 07/24/24 Therapeutic Exercise: UBE x6 mins, 2 mins FW 1 min BW x2 bouts, level 1 OH press x10, 3# LUE inc stiffness in L shoulder, cavitation noted UE PNF D2 pattern 3x10 BUE alternating between sets, improves stiffness noted from [facility] press Side lying LUE ER with 3# DB, 5x5reps Supported median nn glide BUE 3x15  Manual Therapy: IASTM completed to left upper trap distribution. Pt developed light redness consistent with treatment goals to improve efficiency of tissue remodeling principles. Small strokes completed in line with tissue orientation with inc time spent  mid muscle belly of the upper trap with noted trigger point. Pt tolerated tx well, no adverse response. Minor soreness       [facility] Adult PT Treatment:                                                DATE: 07/20/24 Therapeutic Exercise: UBE x5 mins FW Thoracic spine extension in sitting with small ball 3x15 Shrug/antishrug with scap retraction 3x10 Force progression-repeated cervical retraction with pt  overpressure and extension x10 in sitting: dec, better X10 additional reps: dec, better Manual Therapy: Suboccipital release x5 mins in supine, release noted and pt reports improvement following     [facility] Adult PT Treatment:                                                DATE: 07/10/2024   Therapeutic Activity:  Tests and measures  Manual:  Active release of R upper trap (rotation and side bending - sb better)  [facility] Adult PT Treatment:                                                DATE: 06/27/24 Therapeutic Exercise: Seated hamstring isometrics 10x10s BLE Seated physioball press x30, 3 sec hold  Physioball wall walks 2x10 with anterior lean in BUE flexion Overhead lat stretch 3x15 with dowel  Lower trunk rotation 2x15 bilaterally, 2-3 sec hold, gait belt around knees  Shoulder shrug/antishrug 2x10 in sitting   [facility] Adult PT Treatment:                                                DATE: 06/22/24 Therapeutic Exercise: UBE x6 mins seat position 10 forwards  Thoracic spine extension in sitting 2x15 with small ball fulcrum, notes inc c/s pain in third set Repeated cervical spine retraction in sitting x10: dec, better symptoms of left c/s pain about C2-4 in right side bending  X10 additional: continues to note improvements  X10 additional: dec better, ROM improved to nil loss form 25% loss Manual Therapy: Manual soft tissue mobilization with pt seated to cervicothoracic region with inc attention to the upper trap, levator scap, and teres major muscle bellies noting inc adhesions and resting tension which improves as intervention progresses. Trigger point release to R upper trap at junction x90s well tolerated, reduction noted, unable to attain full release   [facility] Adult PT Treatment:                                                DATE: 06/20/24 Therapeutic Exercise: Nustep level 4 x 5 mins Upper trap stretch BIL 30 Supine dowel flexion 2x10 Seated horizontal abduction RTB  2x10 Seated ER RTB x10 (some pain on L) 1000g ball on wall CW/CCW 30 BIL Pball roll out fwd/lat x10 ea Therapeutic Activity: Standing rows  GTB 2x10 Shoulder extension GTB 2x10 Standing press out BTB 2x10 BIL    PATIENT EDUCATION: Education details: Pt educated on relevant anatomy, physiology, pathology, diagnosis, prognosis, progression of care, pain and activity modification related to upper quarter pain Person educated: Patient Education method: Explanation, Demonstration, and Handouts Education comprehension: verbalized understanding and returned demonstration  HOME EXERCISE PROGRAM: Access Code: [email or web address] URL: [email or web address] Date: 07/26/2024 Prepared by: Stann Ohara  Program Notes Take a bath towel run under water  briefly, 5 seconds to introduce some moisture, place in dryer for 5-10 mins, when it comes out, fold it and place it on the left shoulder (over clothing, not directly on skin), x5-10 mins. Temperature should be warm-hot and tolerable to handle before placing on shoulder. Do this before completing exercises.   Exercises - Standing Shoulder Row with Anchored Resistance  - 1 x daily - 4 x weekly - 3 sets - 10 reps - Seated Shoulder Shrugs  - 1 x daily - 4 x weekly - 1 sets - 30 reps - 2 hold - Seated Upper Trapezius Stretch  - 1 x daily - 4 x weekly - 3 sets - 1 reps - 30s hold - Seated Cervical Retraction Extension Passive Loaded  - 2-3 x daily - 7 x weekly - 1 sets - 10 reps - 2 hold - Supine Suboccipital Release with Tennis Balls  - 1-4 x daily - 3 x weekly - 1 sets - 1 reps - 5 min hold - Seated Thoracic Lumbar Extension with Pectoralis Stretch  - 1 x daily - 7 x weekly - 3 sets - 10 reps - 2 hold  ASSESSMENT:  CLINICAL IMPRESSION:  Pt has been responding well to current regimen, discussed symptoms in depth today with plans to adjust HEP today prior to next session. Modifed HEP based on symptoms and risk benefit of each intervention. Pt able  to note improvement at the conclusion of the session today with interventions provided and will plan for re certification at next session, details to be established at that time. Pt stands to benefit from continued skilled physical therapy to address deficit areas and restore safety with activities and participations at home and in the community.       Progress Note:  07/10/24 The pt has been seen for 6 physical therapy visits to address her thoracic, cervical, and B shoulder pain. The pt has made progress with cervical ROM, shoulder ROM, and functional use of the UE's. She continues to have limitations with mobility of shoulders and spine as well as poor tolerance under load. Her ADLs are made challenging for the aforementioned impairments. The pt will benefit from skilled physical therapy to decrease pain and increase function.    EVAL Patient is a 54 y.o. F who was seen today for physical therapy evaluation and treatment for bil shoulder pain, cervical spine pain, and thoracic spine pain. Pt symptoms likely have a cervical component due to degree of bil UE ROM and lack of symptoms, symmetry, and the presence of thoracic spine pain. Pt is not currently working but is limited in her day to day activities, especially with UE use under weight and repetitive tasks. Symptoms in the cervical spine are consistent with decreased tolerance to facet closing moment L>R.  Pt stands to benefit from skilled physical therapy to address deficit areas and restore safety and improve tolerance with activities and participations at home and in the community.    OBJECTIVE IMPAIRMENTS: decreased activity tolerance, decreased ROM, decreased strength, increased  fascial restrictions, impaired perceived functional ability, increased muscle spasms, impaired UE functional use, obesity, and pain.   ACTIVITY LIMITATIONS: carrying, lifting, sleeping, and reach over head  PARTICIPATION LIMITATIONS: meal prep, cleaning, laundry,  community activity, and yard work  PERSONAL FACTORS: Time since onset of injury/illness/exacerbation and 3+ comorbidities: obesity, anemia, arthritis are also affecting patient's functional outcome.   REHAB POTENTIAL: Good  CLINICAL DECISION MAKING: Stable/uncomplicated  EVALUATION COMPLEXITY: Low  GOALS: Goals reviewed with patient? Yes  SHORT TERM GOALS: Target date: 06/30/24   Pt will report compliance with HEP to work towards ind and home management strategies Baseline: Goal status: MET   2.  Pt will score no greater than 7/50 on NDI to demonstrate improved activity tolerance Baseline: 17/50 07/10/24: 9/50 Goal status: progressing   3.  Pt will improve cervical and shoulder AROM to full and painless in order to demonstrate progress towards activity tolerance and improved function Baseline: see ROM chart 07/10/2024: see objective Goal status: progressing     LONG TERM GOALS: Target date: 09/04/24   Pt will score no greater than 0/50 on NDI to demonstrate improved activity tolerance Baseline: 17/50 Goal status: INITIAL   2.  Pt will report no greater than 0/10 pain over 7 consecutive days to demonstrate maintained reduction in symptoms and improved tolerance to activity Baseline:  Goal status: INITIAL   3.  Pt will be ind in the management of their symptoms at home and in the community Baseline:  Goal status: INITIAL     PLAN: PT FREQUENCY: 1-2x/week  PT DURATION: 8 weeks  PLANNED INTERVENTIONS: 97110-Therapeutic exercises, 97530- Therapeutic activity, [id number]- Neuromuscular re-education, 97535- Self Care, 02859- Manual therapy, G0283- Electrical stimulation (unattended), 20560 (1-2 muscles), 20561 (3+ muscles)- Dry Needling, Patient/Family education, Cryotherapy, and Moist heat  For all possible CPT codes, reference the Planned Interventions line above.     Check all conditions that are expected to impact treatment: {Conditions expected to impact  treatment:Morbid obesity and Social determinants of health   If treatment provided at initial evaluation, no treatment charged due to lack of authorization.       PLAN FOR NEXT SESSION: Upper quarter strength, stability, endurance, mobility, motor control through functional; movement patterns, assess repeated end range movements to cervical spine, shoulders, and or thoracic spine as indicated, establish and modify HEP as indicated   Stann DELENA Ohara, PT 07/26/2024, 10:45 AM  "

## 2024-07-26 NOTE — Telephone Encounter (Signed)
 In accordance with refill protocols, please review and address the following requirements before this medication refill can be authorized:  Labs Pt needs a normal Cr lab within 365 days, pt has not done so. Would Dr. Ganji like to refill this medication? Please advise

## 2024-07-31 ENCOUNTER — Ambulatory Visit: Admitting: Physical Therapy

## 2024-07-31 ENCOUNTER — Encounter: Payer: Self-pay | Admitting: Physical Therapy

## 2024-07-31 DIAGNOSIS — G8929 Other chronic pain: Secondary | ICD-10-CM

## 2024-07-31 DIAGNOSIS — M5459 Other low back pain: Secondary | ICD-10-CM

## 2024-07-31 DIAGNOSIS — M6281 Muscle weakness (generalized): Secondary | ICD-10-CM

## 2024-07-31 DIAGNOSIS — M542 Cervicalgia: Secondary | ICD-10-CM | POA: Diagnosis not present

## 2024-07-31 DIAGNOSIS — M546 Pain in thoracic spine: Secondary | ICD-10-CM

## 2024-07-31 NOTE — Therapy (Signed)
 " OUTPATIENT PHYSICAL THERAPY TREATMENT/RECERTIFICATION     Patient Name: Christine Oneal MRN: [id number] DOB:05-19-1971, 54 y.o., female Today's Date: 07/31/2024  END OF SESSION:  PT End of Session - 07/31/24 1004     Visit Number 10    Number of Visits 10    Date for Recertification  08/28/24    Authorization Type [place] medicaid [place]    Authorization Time Period 1/6-08/15/24    Authorization - Visit Number 10    Authorization - Number of Visits 10    Progress Note Due on Visit 18    PT Start Time 1003    PT Stop Time 1045    PT Time Calculation (min) 42 min    Activity Tolerance Patient tolerated treatment well    Behavior During Therapy WFL for tasks assessed/performed               Past Medical History:  Diagnosis Date   Abnormal Pap smear of cervix    Allergy    Anemia    Arthritis    Atrial fibrillation (HCC)    CHF (congestive heart failure) (HCC)    Dysrhythmia    A fib   Family history of adverse reaction to anesthesia    Fibroid    History of atrial fibrillation    History of kidney stones    Hyperparathyroidism    Hypertension    Kidney stones    Leukocytosis, unspecified 10/18/2013   Obesity    Plantar fasciitis    Pneumonia    Past Surgical History:  Procedure Laterality Date   ATRIAL FIBRILLATION ABLATION N/A 08/03/2022   Procedure: ATRIAL FIBRILLATION ABLATION;  Surgeon: Cindie Ole DASEN, MD;  Location: [facility]IVE [facility];  Service: Cardiovascular;  Laterality: N/A;   CARDIOVERSION N/A 05/12/2021   Procedure: CARDIOVERSION;  Surgeon: Cherrie Toribio JONELLE, MD;  Location: [facility] ENDOSCOPY;  Service: Cardiovascular;  Laterality: N/A;   CARDIOVERSION N/A 05/29/2021   Procedure: CARDIOVERSION;  Surgeon: Cherrie Toribio JONELLE, MD;  Location: [facility] ENDOSCOPY;  Service: Cardiovascular;  Laterality: N/A;   COLPOSCOPY     PARATHYROIDECTOMY N/A 01/17/2023   Procedure: NECK EXPLORATION WITH PARATHYROIDECTOMY;  Surgeon: Eletha Boas, MD;  Location: [facility] ORS;   Service: General;  Laterality: N/A;   RIGHT/LEFT HEART CATH AND CORONARY ANGIOGRAPHY N/A 05/11/2021   Procedure: RIGHT/LEFT HEART CATH AND CORONARY ANGIOGRAPHY;  Surgeon: Cherrie Toribio JONELLE, MD;  Location: [facility] INVASIVE CV [facility];  Service: Cardiovascular;  Laterality: N/A;   TEE WITHOUT CARDIOVERSION N/A 05/12/2021   Procedure: TRANSESOPHAGEAL ECHOCARDIOGRAM (TEE);  Surgeon: Cherrie Toribio JONELLE, MD;  Location: [facility] ENDOSCOPY;  Service: Cardiovascular;  Laterality: N/A;   TUBAL LIGATION  2001   Patient Active Problem List   Diagnosis Date Noted   Cervical radiculopathy, chronic 05/04/2024   Allergic rhinitis 03/14/2024   B12 deficiency 03/02/2023   Stage 3b chronic kidney disease (CKD) (HCC) 03/02/2023   Constipation 03/02/2023   Right foot pain 12/09/2022   Urinary incontinence 08/23/2022   Chronic pain of both knees 08/23/2022   Gait instability 08/23/2022   Dysmenorrhea 05/14/2022   Left foot pain 05/14/2022   Encounter for general adult medical examination with abnormal findings 04/17/2022   Primary hyperparathyroidism 11/25/2021   Chronic systolic CHF (congestive heart failure) (HCC) 05/26/2021   Unspecified atrial fibrillation (HCC) 05/04/2021   Anemia 09/04/2017   Morbid obesity (HCC) 09/03/2017   Reactive airway disease 09/03/2017   Vitamin D  deficiency 09/03/2017   Essential hypertension 10/24/2007    PCP: Almarie Cleveland, MD  REFERRING PROVIDER: Joane Artist RAMAN, MD  REFERRING DIAG:  Diagnosis  M25.511,G89.29,M25.512 (ICD-10-CM) - Chronic pain of both shoulders  M54.6,G89.29 (ICD-10-CM) - Chronic bilateral thoracic back pain    THERAPY DIAG:  Cervicalgia  Chronic right shoulder pain  Chronic left shoulder pain  Muscle weakness (generalized)  Pain in thoracic spine  Other low back pain  Rationale for Evaluation and Treatment: Rehabilitation  ONSET DATE: RUE x2 months, LUE   SUBJECTIVE:                                                                                                                                                                                       SUBJECTIVE STATEMENT:   Pt states that she has mild pain in her upper quarter today in the mid back and Bil shoulders R>L with radicular symptoms to the forearm, bilateral but not concurrent. Soreness in cervical spine. Endorses low back pain this morning which has been improving as day progresses. Pt has been compliant with HEP. Pt reports that overall since beginning therapy she notes some improvements in her ability to manage neck pain. Mid back has been unchanging. Shoulders unchanging. Reports that hand pain has improved since beginning PT. Has been taking gabapentin  at night which helps with lower quarter symptoms. Symptoms range in intensity from 2/10-9/10 over the last 2 weeks.    Progress Note: 07/10/24 Pt reports her mid back continues to bother her when standing prolonged periods of time, walking more than 10 mins, and exercising. Her neck is mostly an issue with sleep. The pts shoulders continue to feel sore especially with reaching but her pain alternates between arms. Overall she reports feeling 35% better. She wants to continue PT to avoid injections.    EVAL Pt states that she has been having chronic pain in both shoulders, R shoulder pain has been present for 2 months, wihtout MOI. LUE has been present similar time frame. Presence of arthritis complicates symptoms. [place] about 1 month ago when she was walking down steps, landed on hands and knees, no head contact. Pt noted pain in Bil LE, R foot lateral border, pt states fx present and instructed to allow healing and modify activity, no formal restrictions. Thoracic spine pain inc with fall. Over the last 2 weeks, symptoms are overall worsening with symptoms reaching from the back to the front of the shoulder (bil). She reports symptoms range in intensity from 1/10-8/10 depending on her activity or position as well as time of day.   Hand dominance: Right  PERTINENT HISTORY: Evaluate and Treat for bilat shoulder pain, mid/low back pain 1-2 times per week for 4-6 weeks.   Decrease pain, increase  strength, flexibility, function, and range of motion.  Modalities may include, traction, ionto, phono, stim, and dry needling prn.  PAIN:  Are you having pain? Yes: NPRS scale: 4/10  Pain location: cervical spine and BUE shoulders Pain description: ache, tight, sore Aggravating factors: reaching, lifting Relieving factors: rest  PRECAUTIONS: None  RED FLAGS: None   WEIGHT BEARING RESTRICTIONS: No  FALLS:  Has patient fallen in last 6 months? Yes. Number of falls 1  LIVING ENVIRONMENT: Lives with: lives with their family Lives in: House/apartment Stairs: Yes: External: 2 steps; on left going up Has following equipment at home: None  OCCUPATION: Not working   PLOF: Independent  PATIENT GOALS: to be able to back to normal   NEXT MD VISIT: 09/10/24-PCP  OBJECTIVE:  Note: Objective measures were completed at Evaluation unless otherwise noted.  DIAGNOSTIC FINDINGS:  Thoracic spine 05/16/24: Degenerative change without acute abnormality.  L shoulder 05/16/24: No acute fracture or dislocation is noted. Underlying bony thorax appears within normal limits. No soft tissue abnormality is seen. Mild degenerative changes of the acromioclavicular joint are noted.  IMPRESSION: No acute abnormality noted.   PATIENT SURVEYS :  NDI:  NECK DISABILITY INDEX    Date: 05/31/24 Score 07/10/2024 07/31/24  Pain intensity 2 = The pain is moderate at the moment 1 2  2. Personal care (washing, dressing, etc.) 1 =  I can look after myself normally but it causes extra pain 0 0  3. Lifting 4 =  I can only lift very light weights 4 4  4. Reading 0 = I can read as much as I want to with no pain in my neck 0 1  5. Headaches 1 =  I have slight headaches, which come infrequently 1 4  6. Concentration 0 =  I can concentrate fully when I  want to with no difficulty 0 0  7. Work 2 = I can do most of my usual work, but no more 2 2  8. Driving 1 =  I can drive my car as long as I want with slight pain in my neck 0 2  9. Sleeping 2 = My sleep is mildly disturbed (1-2 hrs sleepless) 1 0  10. Recreation 4 =  I can hardly do any recreation activities because of pain in my neck 0 1  Total 17/50 9/50 16/50   Minimum Detectable Change (90% confidence): 5 points or 10% points  COGNITION: Overall cognitive status: Within functional limits for tasks assessed     SENSATION: WFL  POSTURE: Forward head, rounded shoulders, inc lumbar lordosis, inc thoracic kyphosis  CERVICAL SPINE ROM:    05/31/24    Flexion: nil loss, tightness in upper c/s    Extension: nil loss, R side c/s inc pain    Side bend R: nil loss, L sided stretch     Side bend L: 25% loss, R sided pull, L sided c/s pain   Rot R:  25% loss, R mid c/s Rot L:  nil loss, upper L c/s pain  07/10/24    Flexion: nil loss   Extension: nil loss, R side c/s inc pain    Side bend R: 25% loss, R side c/s inc pain     Side bend L: nil loss, R sided pull  Rot R:  25% loss, R mid c/s inc pain Rot L:  nil loss, upper R c/s pain  07/31/24 Flexion: nil loss, inc L cervical spine pain   Extension:  nil loss, no  symptoms     Side bend R:  25% loss, left sided c/s pain     Side bend L:  Nil loss, left sided c/s pain and R sided cervical stretch Rot R:  nil loss, no symptoms Rot L:  25% loss, inc L sided c/s pain  UPPER EXTREMITY ROM:   Active ROM Right eval Left eval R  07/10/2024 L  07/10/2024 R 07/31/24 L 07/31/24  Shoulder flexion [facility] WFL [facility] WFL  Shoulder extension WFL WFL WFL WFL WFL  WFL P in L anterior shoulder  Shoulder abduction WFL [facility] WFL [facility]  WFL  Shoulder adduction        Shoulder functional internal rotation L2 P in R shoulder T9 P in L shoulder T10 T7 L1 P in R shoulder L1  Shoulder functional external rotation Mid clavicle P in R arm to mid  brachium Mid clavicle P in L shoulder C6  C6  C7 P in right shoulder C7  Elbow flexion        Elbow extension        Wrist flexion        Wrist extension        Wrist ulnar deviation        Wrist radial deviation        Wrist pronation        Wrist supination        (Blank rows = not tested)  UPPER EXTREMITY MMT:  MMT Right eval Left eval Right  07/31/24 Left 07/31/24  Shoulder flexion 4+/5 4+/5 5/5 5/5  Shoulder extension 4+/5 4+/5 5/5 P 5/5 P  Shoulder abduction 4/5 4/5 4+/5 4+/5  Shoulder adduction 4+/5 P in R shoulder 4+/5 P in L shoulder 5/5 5/5  Shoulder internal rotation 4+/5 4+/5 5/5 P 5/5 P  Shoulder external rotation 4/5 4/5 5/5 P 5/5 P  Middle trapezius      Lower trapezius      Elbow flexion      Elbow extension      Wrist flexion      Wrist extension      Wrist ulnar deviation      Wrist radial deviation      Wrist pronation      Wrist supination      Grip strength (lbs)      (Blank rows = not tested)   PALPATION:  07/10/2024 - Inc TTP and resting tension of bilateral upper traps, rhomboids, and levator scap                                                                                                                             TREATMENT DATE:   [facility] Adult PT Treatment:  DATE: 07/31/24  Therapeutic Activity: PT POC reviewed and discussed, goals assessed and updated to reflect current status. Tests and measures.    [facility] Adult PT Treatment:                                                DATE: 07/26/24  Therapeutic Activity: Discussed symptoms in depth identifying symptom changes with various movements included in HEP and pt participations/activities. Modifed HEP based on symptoms and risk benefit of each intervention. Discussed POC update to take place at next session, will trial new HEP and report back.  Review of rows in standing with elbows flexed, inc resistance for home progression, blue TB provided.   Suboccipital release with tennis balls x5 mins in supine to replicate positioning and response for home.    [facility] Adult PT Treatment:                                                DATE: 07/24/24 Therapeutic Exercise: UBE x6 mins, 2 mins FW 1 min BW x2 bouts, level 1 OH press x10, 3# LUE inc stiffness in L shoulder, cavitation noted UE PNF D2 pattern 3x10 BUE alternating between sets, improves stiffness noted from [facility] press Side lying LUE ER with 3# DB, 5x5reps Supported median nn glide BUE 3x15  Manual Therapy: IASTM completed to left upper trap distribution. Pt developed light redness consistent with treatment goals to improve efficiency of tissue remodeling principles. Small strokes completed in line with tissue orientation with inc time spent mid muscle belly of the upper trap with noted trigger point. Pt tolerated tx well, no adverse response. Minor soreness       [facility] Adult PT Treatment:                                                DATE: 07/20/24 Therapeutic Exercise: UBE x5 mins FW Thoracic spine extension in sitting with small ball 3x15 Shrug/antishrug with scap retraction 3x10 Force progression-repeated cervical retraction with pt overpressure and extension x10 in sitting: dec, better X10 additional reps: dec, better Manual Therapy: Suboccipital release x5 mins in supine, release noted and pt reports improvement following      PATIENT EDUCATION: Education details: Pt educated on relevant anatomy, physiology, pathology, diagnosis, prognosis, progression of care, pain and activity modification related to upper quarter pain Person educated: Patient Education method: Explanation, Demonstration, and Handouts Education comprehension: verbalized understanding and returned demonstration  HOME EXERCISE PROGRAM: Access Code: [email or web address] URL: [email or web address] Date: 07/26/2024 Prepared by: Stann Ohara  Program Notes Take a bath towel run under water  briefly,  5 seconds to introduce some moisture, place in dryer for 5-10 mins, when it comes out, fold it and place it on the left shoulder (over clothing, not directly on skin), x5-10 mins. Temperature should be warm-hot and tolerable to handle before placing on shoulder. Do this before completing exercises.   Exercises - Standing Shoulder Row with Anchored Resistance  - 1 x daily - 4 x weekly - 3 sets - 10 reps - Seated Shoulder Shrugs  - 1 x daily - 4 x  weekly - 1 sets - 30 reps - 2 hold - Seated Upper Trapezius Stretch  - 1 x daily - 4 x weekly - 3 sets - 1 reps - 30s hold - Seated Cervical Retraction Extension Passive Loaded  - 2-3 x daily - 7 x weekly - 1 sets - 10 reps - 2 hold - Supine Suboccipital Release with Tennis Balls  - 1-4 x daily - 3 x weekly - 1 sets - 1 reps - 5 min hold - Seated Thoracic Lumbar Extension with Pectoralis Stretch  - 1 x daily - 7 x weekly - 3 sets - 10 reps - 2 hold  ASSESSMENT:  CLINICAL IMPRESSION:  PT POC reviewed today and recertification completed to determine next steps in pt care. There were increases in objective measures indicating inc symptoms and decreased function, pt reports that this is consistent with chronicity and pattern of symptoms in the past for her, but feels that she has been better able to manage symptoms. Pt initially showed good response to interventions, noted regression this reporting period. Symptoms are consistent with cervicalgia and thoracic spine pain which is improving, shoulder symptoms are beginning to fluctuate independent of cervical improvement. Recommend pt follow up with referring provider and continue skilled PT x4 weeks with integration of motor trigger point dry needling and soft tissue mobilization to bilateral shoulders as indicated with localized interventions addressing ROM and motor control as well. Low back has not been heavily addressed to date, with pt reporting inc symptoms, would benefit from continued PT to lower quarter to  continue improving pt independence with all symptoms. Pt stands to benefit from continued skilled physical therapy to address deficit areas and restore safety with activities and participations at home and in the community.       Progress Note:  07/10/24 The pt has been seen for 6 physical therapy visits to address her thoracic, cervical, and B shoulder pain. The pt has made progress with cervical ROM, shoulder ROM, and functional use of the UE's. She continues to have limitations with mobility of shoulders and spine as well as poor tolerance under load. Her ADLs are made challenging for the aforementioned impairments. The pt will benefit from skilled physical therapy to decrease pain and increase function.    EVAL Patient is a 54 y.o. F who was seen today for physical therapy evaluation and treatment for bil shoulder pain, cervical spine pain, and thoracic spine pain. Pt symptoms likely have a cervical component due to degree of bil UE ROM and lack of symptoms, symmetry, and the presence of thoracic spine pain. Pt is not currently working but is limited in her day to day activities, especially with UE use under weight and repetitive tasks. Symptoms in the cervical spine are consistent with decreased tolerance to facet closing moment L>R.  Pt stands to benefit from skilled physical therapy to address deficit areas and restore safety and improve tolerance with activities and participations at home and in the community.    OBJECTIVE IMPAIRMENTS: decreased activity tolerance, decreased ROM, decreased strength, increased fascial restrictions, impaired perceived functional ability, increased muscle spasms, impaired UE functional use, obesity, and pain.   ACTIVITY LIMITATIONS: carrying, lifting, sleeping, and reach over head  PARTICIPATION LIMITATIONS: meal prep, cleaning, laundry, community activity, and yard work  PERSONAL FACTORS: Time since onset of injury/illness/exacerbation and 3+ comorbidities:  obesity, anemia, arthritis are also affecting patient's functional outcome.   REHAB POTENTIAL: Good  CLINICAL DECISION MAKING: Stable/uncomplicated  EVALUATION COMPLEXITY: Low  GOALS:  Goals reviewed with patient? Yes  SHORT TERM GOALS: Target date: 08/15/24    Pt will report compliance with HEP to work towards ind and home management strategies Baseline: 07/31/24: maintains compliance  Goal status: MET   2.  Pt will score no greater than 7/50 on NDI to demonstrate improved activity tolerance Baseline: 17/50 07/10/24: 9/50 07/31/24: 16/50 Goal status: IN PROGRESS   3.  Pt will improve cervical and shoulder AROM to full and painless in order to demonstrate progress towards activity tolerance and improved function Baseline: see ROM chart 07/10/2024: see objective 07/31/24: see chart Goal status: progressing     LONG TERM GOALS: Target date: 09/04/24    Pt will score no greater than 0/50 on NDI to demonstrate improved activity tolerance Baseline: 17/50 07/31/24: 16/50 Goal status: IN PROGRESS   2.  Pt will report no greater than 0/10 pain over 7 consecutive days to demonstrate maintained reduction in symptoms and improved tolerance to activity Baseline:  07/31/24: 2/10-9/10 over the last 2 weeks Goal status: IN PROGRESS   3.  Pt will be ind in the management of their symptoms at home and in the community Baseline:  07/31/24: 80% confidence in the ability to continue independently Goal status: IN PROGRESS     PLAN: PT FREQUENCY: 1-2x/week  PT DURATION: 4 weeks  PLANNED INTERVENTIONS: 97110-Therapeutic exercises, 97530- Therapeutic activity, 97112- Neuromuscular re-education, 97535- Self Care, 02859- Manual therapy, G0283- Electrical stimulation (unattended), 20560 (1-2 muscles), 20561 (3+ muscles)- Dry Needling, Patient/Family education, Cryotherapy, and Moist heat  For all possible CPT codes, reference the Planned Interventions line above.     Check all conditions that  are expected to impact treatment: {Conditions expected to impact treatment:Morbid obesity and Social determinants of health   If treatment provided at initial evaluation, no treatment charged due to lack of authorization.       PLAN FOR NEXT SESSION: Upper quarter strength, stability, endurance, mobility, motor control through functional; movement patterns, assess repeated end range movements to cervical spine, shoulders, and or thoracic spine as indicated, establish and modify HEP as indicated   Stann DELENA Ohara, PT 07/31/2024, 12:21 PM  "

## 2024-08-02 ENCOUNTER — Other Ambulatory Visit: Payer: Self-pay | Admitting: Internal Medicine

## 2024-08-02 ENCOUNTER — Ambulatory Visit: Admitting: Physical Therapy

## 2024-08-07 ENCOUNTER — Ambulatory Visit

## 2024-08-09 ENCOUNTER — Ambulatory Visit: Admitting: Physical Therapy

## 2024-08-15 ENCOUNTER — Telehealth: Admitting: Internal Medicine

## 2024-08-15 DIAGNOSIS — J069 Acute upper respiratory infection, unspecified: Secondary | ICD-10-CM

## 2024-08-15 MED ORDER — FLUTICASONE PROPIONATE 50 MCG/ACT NA SUSP: 2.0000 | Freq: Every day | NASAL | 3 refills | Status: AC

## 2024-08-15 MED ORDER — DM-GUAIFENESIN ER 30-600 MG PO TB12: 1.0000 | ORAL_TABLET | Freq: Two times a day (BID) | ORAL | 0 refills | Status: AC | PRN

## 2024-08-15 NOTE — Progress Notes (Unsigned)
 Virtual Visit via Video Note  I connected with Christine Oneal on 08/15/24 at  9:40 AM EST by a video enabled telemedicine application and verified that I am speaking with the correct person using two identifiers.  The patient and the provider were at separate locations throughout the entire encounter. Patient location: home, Provider location: work   I discussed the limitations of evaluation and management by telemedicine and the availability of in person appointments. The patient expressed understanding and agreed to proceed. The patient and the provider were the only parties present for the visit unless noted in HPI below.  History of Present Illness: Discussed the use of AI scribe software for clinical note transcription with the patient, who gave verbal consent to proceed.  History of Present Illness A 54 year old female presents with symptoms of a cold.  Symptoms began yesterday with rhinorrhea and malaise. Today, she reports improvement but continues to experience nasal congestion and cough. No fever or chills today, and she is uncertain about yesterday. No dyspnea, chest pain, tightness, or palpitations.  Her daughter and granddaughter have had similar symptoms, which she believes she contracted from them.  For treatment, she has taken half a dose of her daughter's Mucinex  1200 mg and Advil . She usually takes Mucinex  DM 600 mg as prescribed and feels the medication has been beneficial, as she woke up feeling better today. She does not currently have any Flonase  nasal spray, which she has used in the past.  No fever, chills, dyspnea, chest pain, tightness, or palpitations.  Observations/Objective: Appearance: normal, breathing appears normal no coughing during visit, casual grooming, memory normal, mental status is A and O times 3  Assessment and Plan Assessment & Plan Acute upper respiratory infection   Likely viral in origin with symptoms of rhinorrhea, cough, and nasal  congestion. Symptoms are improving without fever or dyspnea, but viral transmission remains likely. Prescribe Flonase  nasal spray for nasal congestion and post-nasal drip. Prescribe Mucinex  DM for congestion and cough relief. Advise monitoring of symptoms and reporting of any worsening or respiratory issues. Instruct to contact the office if there is no improvement or if additional treatment is needed.   I discussed the assessment and treatment plan with the patient. The patient was provided an opportunity to ask questions and all were answered. The patient agreed with the plan and demonstrated an understanding of the instructions.   The patient was advised to call back or seek an in-person evaluation if the symptoms worsen or if the condition fails to improve as anticipated.  Christine DELENA Cleveland, MD

## 2024-08-16 ENCOUNTER — Encounter: Payer: Self-pay | Admitting: Internal Medicine

## 2024-08-17 ENCOUNTER — Encounter: Payer: Self-pay | Admitting: Internal Medicine

## 2024-08-21 ENCOUNTER — Ambulatory Visit

## 2024-08-24 ENCOUNTER — Ambulatory Visit

## 2024-09-10 ENCOUNTER — Ambulatory Visit: Admitting: Internal Medicine
